# Patient Record
Sex: Female | Born: 1945 | Race: Black or African American | Hispanic: No | Marital: Married | Smoking: Former smoker
Health system: Southern US, Community
[De-identification: ages and names within clinical notes are randomized; demographics above are authoritative.]

## PROBLEM LIST (undated history)

## (undated) DIAGNOSIS — N6019 Diffuse cystic mastopathy of unspecified breast: Secondary | ICD-10-CM

## (undated) DIAGNOSIS — I5189 Other ill-defined heart diseases: Secondary | ICD-10-CM

## (undated) DIAGNOSIS — Z950 Presence of cardiac pacemaker: Secondary | ICD-10-CM

## (undated) DIAGNOSIS — M1990 Unspecified osteoarthritis, unspecified site: Secondary | ICD-10-CM

## (undated) DIAGNOSIS — M5127 Other intervertebral disc displacement, lumbosacral region: Secondary | ICD-10-CM

## (undated) DIAGNOSIS — Z8619 Personal history of other infectious and parasitic diseases: Secondary | ICD-10-CM

## (undated) DIAGNOSIS — I350 Nonrheumatic aortic (valve) stenosis: Secondary | ICD-10-CM

## (undated) DIAGNOSIS — D649 Anemia, unspecified: Secondary | ICD-10-CM

## (undated) DIAGNOSIS — I441 Atrioventricular block, second degree: Secondary | ICD-10-CM

## (undated) DIAGNOSIS — R911 Solitary pulmonary nodule: Secondary | ICD-10-CM

## (undated) DIAGNOSIS — E7800 Pure hypercholesterolemia, unspecified: Secondary | ICD-10-CM

## (undated) DIAGNOSIS — I10 Essential (primary) hypertension: Secondary | ICD-10-CM

## (undated) DIAGNOSIS — K635 Polyp of colon: Secondary | ICD-10-CM

## (undated) DIAGNOSIS — E785 Hyperlipidemia, unspecified: Secondary | ICD-10-CM

## (undated) DIAGNOSIS — K5790 Diverticulosis of intestine, part unspecified, without perforation or abscess without bleeding: Secondary | ICD-10-CM

## (undated) DIAGNOSIS — K5090 Crohn's disease, unspecified, without complications: Secondary | ICD-10-CM

## (undated) DIAGNOSIS — I340 Nonrheumatic mitral (valve) insufficiency: Secondary | ICD-10-CM

## (undated) HISTORY — DX: Other intervertebral disc displacement, lumbosacral region: M51.27

## (undated) HISTORY — DX: Diffuse cystic mastopathy of unspecified breast: N60.19

## (undated) HISTORY — DX: Anemia, unspecified: D64.9

## (undated) HISTORY — DX: Diverticulosis of intestine, part unspecified, without perforation or abscess without bleeding: K57.90

## (undated) HISTORY — DX: Hyperlipidemia, unspecified: E78.5

## (undated) HISTORY — DX: Essential (primary) hypertension: I10

## (undated) HISTORY — DX: Pure hypercholesterolemia, unspecified: E78.00

## (undated) HISTORY — PX: BREAST BIOPSY: SHX20

## (undated) HISTORY — DX: Crohn's disease, unspecified, without complications: K50.90

## (undated) HISTORY — DX: Personal history of other infectious and parasitic diseases: Z86.19

## (undated) HISTORY — DX: Solitary pulmonary nodule: R91.1

## (undated) HISTORY — DX: Unspecified osteoarthritis, unspecified site: M19.90

## (undated) HISTORY — DX: Polyp of colon: K63.5

## (undated) HISTORY — PX: REPLACEMENT TOTAL KNEE: SUR1224

---

## 1990-08-11 HISTORY — PX: BACK SURGERY: SHX140

## 1996-08-11 HISTORY — PX: COLON SURGERY: SHX602

## 1996-08-11 HISTORY — PX: OTHER SURGICAL HISTORY: SHX169

## 1997-08-11 HISTORY — PX: POSTERIOR LAMINECTOMY / DECOMPRESSION LUMBAR SPINE: SUR740

## 2001-08-11 HISTORY — PX: COLECTOMY: SHX59

## 2002-01-25 DIAGNOSIS — K5010 Crohn's disease of large intestine without complications: Secondary | ICD-10-CM | POA: Insufficient documentation

## 2002-08-11 DIAGNOSIS — R911 Solitary pulmonary nodule: Secondary | ICD-10-CM

## 2002-08-11 HISTORY — DX: Solitary pulmonary nodule: R91.1

## 2003-01-26 DIAGNOSIS — Z5181 Encounter for therapeutic drug level monitoring: Secondary | ICD-10-CM | POA: Insufficient documentation

## 2003-01-26 DIAGNOSIS — Z9049 Acquired absence of other specified parts of digestive tract: Secondary | ICD-10-CM | POA: Insufficient documentation

## 2004-07-24 ENCOUNTER — Ambulatory Visit: Payer: Self-pay | Admitting: Internal Medicine

## 2004-08-23 ENCOUNTER — Ambulatory Visit: Payer: Self-pay | Admitting: Internal Medicine

## 2005-04-25 ENCOUNTER — Ambulatory Visit: Payer: Self-pay | Admitting: Internal Medicine

## 2005-06-06 ENCOUNTER — Ambulatory Visit: Payer: Self-pay | Admitting: Internal Medicine

## 2005-08-11 HISTORY — PX: ROTATOR CUFF REPAIR: SHX139

## 2005-09-10 ENCOUNTER — Emergency Department: Payer: Self-pay | Admitting: Emergency Medicine

## 2005-10-23 ENCOUNTER — Ambulatory Visit: Payer: Self-pay | Admitting: Orthopaedic Surgery

## 2005-10-31 ENCOUNTER — Other Ambulatory Visit: Payer: Self-pay

## 2005-11-04 ENCOUNTER — Ambulatory Visit: Payer: Self-pay | Admitting: Orthopaedic Surgery

## 2006-05-08 ENCOUNTER — Ambulatory Visit: Payer: Self-pay | Admitting: Internal Medicine

## 2007-07-13 LAB — HM COLONOSCOPY

## 2007-08-12 DIAGNOSIS — K635 Polyp of colon: Secondary | ICD-10-CM

## 2007-08-12 HISTORY — DX: Polyp of colon: K63.5

## 2007-08-12 HISTORY — PX: COLONOSCOPY: SHX174

## 2007-09-28 ENCOUNTER — Ambulatory Visit: Payer: Self-pay | Admitting: Internal Medicine

## 2008-10-09 ENCOUNTER — Ambulatory Visit: Payer: Self-pay | Admitting: Internal Medicine

## 2009-10-12 ENCOUNTER — Ambulatory Visit: Payer: Self-pay | Admitting: Internal Medicine

## 2009-12-23 ENCOUNTER — Ambulatory Visit: Payer: Self-pay | Admitting: Internal Medicine

## 2010-07-01 ENCOUNTER — Ambulatory Visit: Payer: Self-pay | Admitting: Internal Medicine

## 2010-08-21 ENCOUNTER — Ambulatory Visit: Payer: Self-pay | Admitting: Family Medicine

## 2010-10-14 ENCOUNTER — Ambulatory Visit: Payer: Self-pay | Admitting: Internal Medicine

## 2010-10-14 LAB — HM MAMMOGRAPHY

## 2011-07-13 LAB — HM PAP SMEAR

## 2011-10-20 ENCOUNTER — Ambulatory Visit: Payer: Self-pay | Admitting: Internal Medicine

## 2011-12-20 ENCOUNTER — Ambulatory Visit: Payer: Self-pay

## 2012-05-17 ENCOUNTER — Other Ambulatory Visit: Payer: Self-pay | Admitting: Internal Medicine

## 2012-05-17 MED ORDER — LISINOPRIL 20 MG PO TABS: 20.0000 mg | ORAL_TABLET | Freq: Every day | ORAL | Status: DC

## 2012-05-17 NOTE — Telephone Encounter (Signed)
Refill request for lisinopril 20 mg tab Qty: 30 Sig: take 1 tablet by mouth every day

## 2012-05-17 NOTE — Telephone Encounter (Signed)
Patient has an appt in 08/2012 please advise.

## 2012-05-17 NOTE — Telephone Encounter (Signed)
Ok x 3.

## 2012-07-22 ENCOUNTER — Telehealth: Payer: Self-pay | Admitting: Internal Medicine

## 2012-07-22 NOTE — Telephone Encounter (Signed)
Refill request for atenolol 50 mg tablet Qty: 30 Sig: take 1 tablet by mouth once a day Patient coming in on 08/12/12

## 2012-07-23 ENCOUNTER — Other Ambulatory Visit: Payer: Self-pay | Admitting: *Deleted

## 2012-07-23 MED ORDER — ATENOLOL 50 MG PO TABS: 50.0000 mg | ORAL_TABLET | Freq: Every day | ORAL | Status: DC

## 2012-07-23 NOTE — Progress Notes (Signed)
Refilled script for 30 pills zero refills. Appointment 08/12/2012

## 2012-08-12 ENCOUNTER — Other Ambulatory Visit (HOSPITAL_COMMUNITY)
Admission: RE | Admit: 2012-08-12 | Discharge: 2012-08-12 | Disposition: A | Discharge status: Home / Self Care | Payer: Medicare Other | Source: Ambulatory Visit | Attending: Internal Medicine | Admitting: Internal Medicine

## 2012-08-12 ENCOUNTER — Encounter: Payer: Self-pay | Admitting: Internal Medicine

## 2012-08-12 ENCOUNTER — Ambulatory Visit (INDEPENDENT_AMBULATORY_CARE_PROVIDER_SITE_OTHER): Payer: Medicare Other | Admitting: Internal Medicine

## 2012-08-12 VITALS — BP 120/80 | HR 61 | Temp 97.7°F | Ht 65.0 in | Wt 158.2 lb

## 2012-08-12 DIAGNOSIS — K5090 Crohn's disease, unspecified, without complications: Secondary | ICD-10-CM

## 2012-08-12 DIAGNOSIS — R5381 Other malaise: Secondary | ICD-10-CM

## 2012-08-12 DIAGNOSIS — E7800 Pure hypercholesterolemia, unspecified: Secondary | ICD-10-CM

## 2012-08-12 DIAGNOSIS — R5383 Other fatigue: Secondary | ICD-10-CM

## 2012-08-12 DIAGNOSIS — I10 Essential (primary) hypertension: Secondary | ICD-10-CM

## 2012-08-12 DIAGNOSIS — Z01419 Encounter for gynecological examination (general) (routine) without abnormal findings: Secondary | ICD-10-CM | POA: Insufficient documentation

## 2012-08-12 DIAGNOSIS — Z1151 Encounter for screening for human papillomavirus (HPV): Secondary | ICD-10-CM | POA: Insufficient documentation

## 2012-08-12 DIAGNOSIS — Z139 Encounter for screening, unspecified: Secondary | ICD-10-CM

## 2012-08-12 DIAGNOSIS — Z23 Encounter for immunization: Secondary | ICD-10-CM

## 2012-08-12 LAB — CBC WITH DIFFERENTIAL/PLATELET
Basophils Absolute: 0 10*3/uL (ref 0.0–0.1)
Basophils Relative: 0.4 % (ref 0.0–3.0)
Eosinophils Absolute: 0 10*3/uL (ref 0.0–0.7)
Eosinophils Relative: 0.1 % (ref 0.0–5.0)
HCT: 36.5 % (ref 36.0–46.0)
Hemoglobin: 12.5 g/dL (ref 12.0–15.0)
Lymphocytes Relative: 29.3 % (ref 12.0–46.0)
Lymphs Abs: 2.3 10*3/uL (ref 0.7–4.0)
MCHC: 34.1 g/dL (ref 30.0–36.0)
MCV: 92.3 fl (ref 78.0–100.0)
Monocytes Absolute: 0.5 10*3/uL (ref 0.1–1.0)
Monocytes Relative: 6.3 % (ref 3.0–12.0)
Neutro Abs: 5.1 10*3/uL (ref 1.4–7.7)
Neutrophils Relative %: 63.9 % (ref 43.0–77.0)
Platelets: 177 10*3/uL (ref 150.0–400.0)
RBC: 3.96 Mil/uL (ref 3.87–5.11)
RDW: 13.9 % (ref 11.5–14.6)
WBC: 8 10*3/uL (ref 4.5–10.5)

## 2012-08-12 LAB — HEPATIC FUNCTION PANEL
ALT: 21 U/L (ref 0–35)
AST: 27 U/L (ref 0–37)
Albumin: 3.8 g/dL (ref 3.5–5.2)
Alkaline Phosphatase: 67 U/L (ref 39–117)
Bilirubin, Direct: 0.1 mg/dL (ref 0.0–0.3)
Total Bilirubin: 1.1 mg/dL (ref 0.3–1.2)
Total Protein: 7.2 g/dL (ref 6.0–8.3)

## 2012-08-12 LAB — TSH: TSH: 0.74 u[IU]/mL (ref 0.35–5.50)

## 2012-08-12 LAB — BASIC METABOLIC PANEL
BUN: 14 mg/dL (ref 6–23)
CO2: 25 mEq/L (ref 19–32)
Calcium: 9 mg/dL (ref 8.4–10.5)
Chloride: 108 mEq/L (ref 96–112)
Creatinine, Ser: 0.8 mg/dL (ref 0.4–1.2)
GFR: 89.5 mL/min (ref >60.00)
Glucose, Bld: 108 mg/dL — ABNORMAL HIGH (ref 70–99)
Potassium: 3.7 mEq/L (ref 3.5–5.1)
Sodium: 141 mEq/L (ref 135–145)

## 2012-08-12 LAB — LIPID PANEL
Cholesterol: 199 mg/dL (ref 0–200)
HDL: 50.4 mg/dL (ref >39.00)
LDL Cholesterol: 113 mg/dL — ABNORMAL HIGH (ref 0–99)
Total CHOL/HDL Ratio: 4
Triglycerides: 180 mg/dL — ABNORMAL HIGH (ref 0.0–149.0)
VLDL: 36 mg/dL (ref 0.0–40.0)

## 2012-08-12 MED ORDER — FLUTICASONE PROPIONATE 50 MCG/ACT NA SUSP: 2.0000 | Freq: Every day | NASAL | Status: DC

## 2012-08-12 NOTE — Assessment & Plan Note (Addendum)
Followed by Dr Evalee Mutton at [facility].  Is planning for a follow up colonoscopy in 4/14.  Already scheduled.  Currently asymptomatic.  Check cbc.

## 2012-08-12 NOTE — Progress Notes (Signed)
Subjective:    Patient ID: Ellen Drake, female    DOB: [birth year], 67 y.o.   MRN: [id number]  HPI 67 year old female with past history of hypercholesterolemia, hypertension, Crohn's disease and fibrocystic breast disease who comes in today to follow up on these issues as well as for a complete physical exam.  States she has been doing relatively well.  Some increased stress.  Her sister-n-law recently passed away.  She feels she is handling things relatively well.  No cardiac symptoms with increased activity or exertion.  No bowel change.  No abdominal pain.  Breathing stable.    Past Medical History  Diagnosis Date  . Hypertension   . Hypercholesterolemia   . Crohn's disease   . Fibrocystic breast disease   . Diverticulosis     Current Outpatient Prescriptions on File Prior to Visit  Medication Sig Dispense Refill  . atenolol (TENORMIN) 50 MG tablet Take 1 tablet (50 mg total) by mouth daily.  30 tablet  0  . lisinopril (PRINIVIL,ZESTRIL) 20 MG tablet Take 1 tablet (20 mg total) by mouth daily.  30 tablet  3  . mesalamine (PENTASA) 250 MG CR capsule Take 6 tablets bid      . fluticasone (FLONASE) 50 MCG/ACT nasal spray Place 2 sprays into the nose daily.  16 g  0    Review of Systems Patient denies any headache, lightheadedness or dizziness. States starting this am, has noticed some runny nose and increased drainage.  No fever.  No sinus pressure.  No chest pain, tightness or palpitations.  No increased shortness of breath, cough or congestion.  No nausea or vomiting.  No abdominal pain or cramping.  No bowel change, such as diarrhea, constipation, BRBPR or melana.  No urine change.  Quit smoking two years ago.  She does report noticing a small lesion inside her left ear.  First noticed a few weeks ago.  Minimal tenderness.       Objective:   Physical Exam Filed Vitals:   08/12/12 0816  BP: 120/80  Pulse: 61  Temp: 97.7 F (36.5 C)   Blood pressure recheck:  140-88/63  67  year old female in no acute distress.   HEENT:  Nares- clear.  Oropharynx - without lesions.  Small lesion just inside left ear.  Minimal erythema.  Minimal tenderness.   NECK:  Supple.  Nontender.  No audible bruit.  HEART:  Appears to be regular. LUNGS:  No crackles or wheezing audible.  Respirations even and unlabored.  RADIAL PULSE:  Equal bilaterally.    BREASTS:  No nipple discharge or nipple retraction present.  Could not appreciate any distinct nodules or axillary adenopathy.  ABDOMEN:  Soft, nontender.  Bowel sounds present and normal.  No audible abdominal bruit.  GU:  Normal external genitalia.  Vaginal vault without lesions.  Cervix identified.  Pap performed. Could not appreciate any adnexal masses or tenderness.   RECTAL:  Heme negative.   EXTREMITIES:  No increased edema present.  DP pulses palpable and equal bilaterally.           Assessment & Plan:  PROBABLE ALLERGIES.  Treat with saline nasal flushes and flonase nasal spray as directed.  Notify me if any persistent problems.    EAR LESION.  Treat with bacroban cream.  Let me know if incomplete resolution.   PULMONARY.  Breathing stable.  Quit smoking two years ago.  Follow.   HEALTH MAINTENANCE.  Physical today.  Pap performed.  Last  mammogram 10/20/11 - BiRADS II.  Schedule follow up mammogram.   Colonoscopy 11/16/07.  Due a follow up colonoscopy 4/14.  Already scheduled.

## 2012-08-14 ENCOUNTER — Encounter: Payer: Self-pay | Admitting: Internal Medicine

## 2012-08-14 ENCOUNTER — Telehealth: Payer: Self-pay | Admitting: Internal Medicine

## 2012-08-14 DIAGNOSIS — R739 Hyperglycemia, unspecified: Secondary | ICD-10-CM

## 2012-08-14 NOTE — Assessment & Plan Note (Signed)
Blood pressure borderline on my check.  Have her spot check her pressures and get her back in soon to reassess.  Same medication regimen.  Check metabolic panel.

## 2012-08-14 NOTE — Telephone Encounter (Signed)
Pt notified of labs and need for follow up sugar check.  Pt coming in 10/18/12 at 8:45.  Pt aware of appt.  Please put on lab schedule.  Thanks.

## 2012-08-14 NOTE — Assessment & Plan Note (Signed)
Low cholesterol diet and exercise.  Check lipid panel.

## 2012-08-17 ENCOUNTER — Encounter: Payer: Self-pay | Admitting: *Deleted

## 2012-09-06 ENCOUNTER — Encounter: Payer: Self-pay | Admitting: Adult Health

## 2012-09-06 ENCOUNTER — Ambulatory Visit (INDEPENDENT_AMBULATORY_CARE_PROVIDER_SITE_OTHER): Payer: Medicare Other | Admitting: Adult Health

## 2012-09-06 ENCOUNTER — Telehealth: Payer: Self-pay | Admitting: Internal Medicine

## 2012-09-06 VITALS — BP 133/82 | HR 80 | Temp 98.1°F | Resp 18 | Ht 66.0 in | Wt 157.0 lb

## 2012-09-06 DIAGNOSIS — J40 Bronchitis, not specified as acute or chronic: Secondary | ICD-10-CM

## 2012-09-06 MED ORDER — AMOXICILLIN-POT CLAVULANATE 875-125 MG PO TABS: 1.0000 | ORAL_TABLET | Freq: Two times a day (BID) | ORAL | Status: DC

## 2012-09-06 MED ORDER — HYDROCODONE-HOMATROPINE 5-1.5 MG/5ML PO SYRP: 5.0000 mL | ORAL_SOLUTION | Freq: Three times a day (TID) | ORAL | Status: DC | PRN

## 2012-09-06 NOTE — Patient Instructions (Addendum)
  Please start the antibiotic today. You will take it for 7 days.  I have given you a prescription for Hycodan. This is a cough medicine and it can make you drowsy.  What is bronchitis? - Bronchitis is an infection that causes a cough. It happens when the tubes that carry air into the lungs, called the "bronchi," get infected.   Usually, bronchitis happens when a person gets a cold or the flu. The viruses that cause the cold or flu infect the bronchi and irritate them.    What are the symptoms of bronchitis? - The most common symptoms of bronchitis are:  A nagging cough that can last up to a few weeks  Coughing up mucus that is clear, yellow, or green  People with bronchitis do not usually get a fever.  When should I call the doctor or nurse? - Most people who have a cough that lasts longer than their other cold or flu symptoms do not need to see a doctor. But you should call your doctor or nurse if you have:  A fever higher than 100.40F (38C)  A cough that lasts longer than 10 days  Chest pain when you cough, trouble breathing, or coughing up blood  A barking cough that makes it hard to talk  A cough and weight loss that you cannot explain  Is there a test for bronchitis? - People do not usually need a test. But your doctor or nurse might do a test, such as a chest X-ray, if the cause of your cough isn't clear.   How is bronchitis treated? - Doctors do not usually treat bronchitis with antibiotic medicines. That's because bronchitis is usually caused by a virus, and antibiotics kill bacteria-not viruses.   To feel better, you can treat your cold and flu symptoms. Different treatments you can try include:  Taking a pain-relieving medicine  Taking over-the-counter cough and cold medicines  Breathing in warm, moist air, such as in the shower, over a kettle, or from a humidifier  How can I keep from getting bronchitis again? - You can reduce your chance of getting bronchitis again by  keeping the germs that cause bronchitis out of your body. One of the best ways to do this is to wash your hands often with soap and water. If there is no sink nearby, you can use a hand gel with alcohol in it to clean your hands.   How can I keep from spreading my germs? - In addition to washing your hands often, you should cover your mouth with your elbow when you sneeze or cough. Using your elbow keeps you from getting germs on your hands. If you use a tissue, throw the tissue away and wash your hands.

## 2012-09-06 NOTE — Telephone Encounter (Signed)
Patient Information:  Caller Name: Amnah  Phone: [phone]  Patient: Ellen Drake, Ellen Drake  Gender: Female  DOB: Oct 02, 1945  Age: 67 Years  PCP: Einar Pheasant  Office Follow Up:  Does the office need to follow up with this patient?: No  Instructions For The Office: N/A   Symptoms  Reason For Call & Symptoms: Cough.  Vomiting, body aches, earache, stuffy nose.  Reports severe coughing spells and coughing up rusty colored sputum.  Reviewed Health History In EMR: Yes  Reviewed Medications In EMR: Yes  Reviewed Allergies In EMR: Yes  Reviewed Surgeries / Procedures: Yes  Date of Onset of Symptoms: 09/03/2012  Treatments Tried: Tylenol OTC, Cough Syrup  Treatments Tried Worked: No  Guideline(s) Used:  Cough  Disposition Per Guideline:   See Today in Office  Reason For Disposition Reached:   Coughing up rusty-colored (reddish-brown) or blood-tinged sputum  Advice Given:  Reassurance  Coughing is the way that our lungs remove irritants and mucus. It helps protect our lungs from getting pneumonia.  Cough Medicines:  Home Remedy - Hard Candy: Hard candy works just as well as medicine-flavored OTC cough drops. Diabetics should use sugar-free candy.  Home Remedy - Honey: This old home remedy has been shown to help decrease coughing at night. The adult dosage is 2 teaspoons (10 ml) at bedtime. Honey should not be given to infants under one year of age.  Coughing Spasms:  Drink warm fluids. Inhale warm mist (Reason: both relax the airway and loosen up the phlegm).  Prevent Dehydration:  Drink adequate liquids.  This will help soothe an irritated or dry throat and loosen up the phlegm.  Avoid Tobacco Smoke:  Smoking or being exposed to smoke makes coughs much worse.  Expected Course:   The expected course depends on what is causing the cough.  Viral bronchitis (chest cold) causes a cough that lasts 1 to 3 weeks. Sometimes you may cough up lots of phlegm (sputum, mucus). The mucus  can normally be white, gray, yellow, or green.  Call Back If:  Difficulty breathing  You become worse.  Appointment Scheduled:  09/06/2012 14:00:00 Appointment Scheduled Provider:  Charolette Forward

## 2012-09-06 NOTE — Assessment & Plan Note (Addendum)
Bronchitis with sinus infection. Febrile x 3 days. Cough keeping her up at night. Productive, dark sputum. Sinus congestion, drainage. Start augmentin. Hycodan for cough. RTC if no improvement within 3-4 days.

## 2012-09-06 NOTE — Progress Notes (Signed)
  Subjective:    Patient ID: Ellen Drake, female    DOB: 1945/09/14, 66 y.o.   MRN: [id number]  HPI  Patient is a very pleasant 67 y/o female who presents to clinic with 3 day hx of fever, malaise, cough, sinus drainage/congestion, productive, dark drainage. Denies shortness of breath. Patient has received her flu shot this season. She reports being around sick contacts frequently - she drives a school bus. Her cough has been keeping her up at night. She has tried OTC cough medicine without any improvement in symptoms.  Current Outpatient Prescriptions on File Prior to Visit  Medication Sig Dispense Refill  . aspirin 81 MG tablet Take 81 mg by mouth daily.      Marland Kitchen atenolol (TENORMIN) 50 MG tablet Take 1 tablet (50 mg total) by mouth daily.  30 tablet  0  . brimonidine (ALPHAGAN) 0.2 % ophthalmic solution 1 drop 3 (three) times daily.      . fluticasone (FLONASE) 50 MCG/ACT nasal spray Place 2 sprays into the nose daily.  16 g  0  . latanoprost (XALATAN) 0.005 % ophthalmic solution 1 drop at bedtime.      Marland Kitchen lisinopril (PRINIVIL,ZESTRIL) 20 MG tablet Take 1 tablet (20 mg total) by mouth daily.  30 tablet  3  . mesalamine (PENTASA) 250 MG CR capsule Take 6 tablets bid      . timolol (BETIMOL) 0.5 % ophthalmic solution 1 drop 2 (two) times daily.          Review of Systems  Constitutional: Positive for fever and chills.  HENT: Positive for congestion, sore throat, rhinorrhea, postnasal drip and sinus pressure.   Eyes: Negative.   Respiratory: Positive for cough. Negative for shortness of breath and wheezing.   Cardiovascular: Negative.   Gastrointestinal: Positive for nausea.  Genitourinary: Negative.   Musculoskeletal:       Generalized aches/pains.  Neurological: Negative.   Psychiatric/Behavioral: Negative.     BP 133/82  Pulse 80  Temp 98.1 F (36.7 C) (Oral)  Resp 18  Ht 5' 6"  (1.676 m)  Wt 157 lb (71.215 kg)  BMI 25.34 kg/m2  SpO2 91%  LMP 08/13/1995     Objective:     Physical Exam  Constitutional: She is oriented to person, place, and time. She appears well-developed and well-nourished.  HENT:  Head: Normocephalic and atraumatic.  Right Ear: External ear normal.  Left Ear: External ear normal.       Pharyngeal erythema without exudate.  Cardiovascular: Normal rate, regular rhythm and normal heart sounds.  Exam reveals no gallop.   No murmur heard. Pulmonary/Chest: Effort normal and breath sounds normal.  Abdominal: Soft. Bowel sounds are normal.  Lymphadenopathy:    She has cervical adenopathy.  Neurological: She is alert and oriented to person, place, and time.  Skin: Skin is warm and dry. No rash noted.  Psychiatric: She has a normal mood and affect. Her behavior is normal. Judgment and thought content normal.          Assessment & Plan:

## 2012-09-09 ENCOUNTER — Telehealth: Payer: Self-pay | Admitting: Internal Medicine

## 2012-09-09 NOTE — Telephone Encounter (Signed)
Pt is saying that her cough is getting much worse from her lat visit with Raquel.

## 2012-09-09 NOTE — Telephone Encounter (Signed)
I am unable to see her tomorrow.  I will be happy to see her Monday 09/13/12 - 11:00 work in for this.  If she is having any acute problems - needs eval before Monday and then can follow up with me afterwards.

## 2012-09-09 NOTE — Telephone Encounter (Signed)
Patient notified.  Appointment scheduled.

## 2012-09-10 ENCOUNTER — Ambulatory Visit: Payer: Self-pay | Admitting: Emergency Medicine

## 2012-09-13 ENCOUNTER — Ambulatory Visit: Payer: Medicare Other | Admitting: Internal Medicine

## 2012-09-14 ENCOUNTER — Other Ambulatory Visit: Payer: Self-pay | Admitting: *Deleted

## 2012-09-14 MED ORDER — LISINOPRIL 20 MG PO TABS: 20.0000 mg | ORAL_TABLET | Freq: Every day | ORAL | Status: DC

## 2012-10-01 ENCOUNTER — Ambulatory Visit (INDEPENDENT_AMBULATORY_CARE_PROVIDER_SITE_OTHER): Payer: Medicare Other | Admitting: Internal Medicine

## 2012-10-01 ENCOUNTER — Encounter: Payer: Self-pay | Admitting: Internal Medicine

## 2012-10-01 VITALS — BP 120/80 | HR 55 | Temp 97.5°F | Ht 65.0 in | Wt 158.0 lb

## 2012-10-01 DIAGNOSIS — I10 Essential (primary) hypertension: Secondary | ICD-10-CM

## 2012-10-01 MED ORDER — FLUTICASONE PROPIONATE HFA 110 MCG/ACT IN AERO: 1.0000 | INHALATION_SPRAY | Freq: Two times a day (BID) | RESPIRATORY_TRACT | Status: DC

## 2012-10-01 MED ORDER — PREDNISONE 10 MG PO TABS: ORAL_TABLET | ORAL | Status: DC

## 2012-10-01 MED ORDER — CEFDINIR 300 MG PO CAPS: 300.0000 mg | ORAL_CAPSULE | Freq: Two times a day (BID) | ORAL | Status: DC

## 2012-10-04 ENCOUNTER — Telehealth: Payer: Self-pay | Admitting: Internal Medicine

## 2012-10-04 ENCOUNTER — Encounter: Payer: Self-pay | Admitting: Internal Medicine

## 2012-10-04 NOTE — Telephone Encounter (Signed)
Faxed request for records

## 2012-10-04 NOTE — Assessment & Plan Note (Signed)
Blood pressure under good control today.  Follow.

## 2012-10-04 NOTE — Progress Notes (Signed)
Subjective:    Patient ID: Ellen Drake, female    DOB: 1945/12/18, 67 y.o.   MRN: [id number]  Cough  67 year old female with past history of hypercholesterolemia, hypertension, Crohn's disease and fibrocystic breast disease who comes in today as a work in with concerns regarding persistent cough and congestion.  She saw Raquel recently.  Had been having increased cough and congestion for approximately one week.  See her note for details.  Was placed on augmentin.  Symptoms persistent and she went to [facility] a few days later.  [facility]).  States she was told she possibly had pneumonia - per CXR.  Was given an abx and an inhaler.  States she is off all abx now.  Still with increased cough - productive.  Mucus no longer yellow. Some wheezing.  No increased sob.  No nausea.  Eating.  No fever and no rash. She does report bilateral knee stiffness.  Has come on in the last week or so.  Increased discomfort going from sitting to standing.  No other joint issues.     Past Medical History  Diagnosis Date  . Hypertension   . Hypercholesterolemia   . Crohn's disease   . Fibrocystic breast disease   . Diverticulosis     Current Outpatient Prescriptions on File Prior to Visit  Medication Sig Dispense Refill  . aspirin 81 MG tablet Take 81 mg by mouth daily.      Marland Kitchen atenolol (TENORMIN) 50 MG tablet Take 1 tablet (50 mg total) by mouth daily.  30 tablet  0  . brimonidine (ALPHAGAN) 0.2 % ophthalmic solution 1 drop 3 (three) times daily.      . fluticasone (FLONASE) 50 MCG/ACT nasal spray Place 2 sprays into the nose daily.  16 g  0  . latanoprost (XALATAN) 0.005 % ophthalmic solution 1 drop at bedtime.      Marland Kitchen lisinopril (PRINIVIL,ZESTRIL) 20 MG tablet Take 1 tablet (20 mg total) by mouth daily.  30 tablet  3  . mesalamine (PENTASA) 250 MG CR capsule Take 6 tablets bid      . timolol (BETIMOL) 0.5 % ophthalmic solution 1 drop 2 (two) times daily.      Marland Kitchen amoxicillin-clavulanate (AUGMENTIN)  875-125 MG per tablet Take 1 tablet by mouth 2 (two) times daily.  14 tablet  0  . HYDROcodone-homatropine (HYCODAN) 5-1.5 MG/5ML syrup Take 5 mLs by mouth every 8 (eight) hours as needed for cough.  120 mL  0   No current facility-administered medications on file prior to visit.    Review of Systems  Respiratory: Positive for cough.   Patient denies any headache, lightheadedness or dizziness. Describes the increased congestion and cough as outlined.  No sinus pressure.  No sore throat.  No fever.  No rash.  No nausea or vomiting.  Bowels stable.  No abdominal pain.  Does report the knee stiffness as outlined.  No other joint complaints.        Objective:   Physical Exam  Filed Vitals:   10/01/12 0926  BP: 120/80  Pulse: 55  Temp: 97.5 F (52.35 C)   67 year old female in no acute distress.   HEENT:  Nares- clear.  Oropharynx - without lesions.  TMs visualized - without erythema.  No sinus tenderness to palpation.     NECK:  Supple.  Nontender.  HEART:  Appears to be regular. LUNGS:  No crackles.  Increased cough with forced expiration.  No increased wheezing.  Respirations even and unlabored.  RADIAL PULSE:  Equal bilaterally.  ABDOMEN:  Soft, nontender.  Bowel sounds present and normal.  No audible abdominal bruit.  EXTREMITIES:  No increased edema present.  DP pulses palpable and equal bilaterally.       MSK:  No increased swelling or erythema of the knees.  Increased stiffness/discomfort going from sitting to standing.      Assessment & Plan:  PULMONARY.  Persistent cough and congestion.  Symptoms and exam as outlined.  Need to obtain records from [facility].  Will treat with a prednisone taper - starting at 24m and decreasing by 596mper day until off.  Omnicef 30031m2/day) as directed.  Mucinex/robitussin as directed.  Continue albuterol inhaler.  Will add Flovent 110m25m two puffs bid.  Follow closely.  If any change or worsening symptoms, she is to be evaluated  immediately.      MSK.  Knee stiffness as outlined.  Unclear as to the exact etiology.  No redness or swelling.  No fever or rash.  The prednisone taper should help with this.  Get her back in soon to reassess.  Further w/up pending response.    HEALTH MAINTENANCE.  Physical last visit.  Pap performed.  Last mammogram 10/20/11 - BiRADS II.  Scheduled for a follow up mammogram.   Colonoscopy 11/16/07.  Due a follow up colonoscopy 4/14.  Already scheduled.

## 2012-10-04 NOTE — Telephone Encounter (Signed)
Need records from [facility].  Pt was seen there a few weeks ago.  Also need cxr results.  Please also call pt and confirm she is doing better.  I saw her as an acute visit last week.

## 2012-10-05 NOTE — Telephone Encounter (Signed)
Records received

## 2012-10-18 ENCOUNTER — Other Ambulatory Visit: Payer: Medicare Other

## 2012-10-19 ENCOUNTER — Ambulatory Visit (INDEPENDENT_AMBULATORY_CARE_PROVIDER_SITE_OTHER): Payer: Medicare Other | Admitting: Internal Medicine

## 2012-10-19 ENCOUNTER — Encounter: Payer: Self-pay | Admitting: Internal Medicine

## 2012-10-19 VITALS — BP 120/70 | HR 60 | Temp 97.6°F | Ht 65.0 in | Wt 159.5 lb

## 2012-10-19 DIAGNOSIS — K5090 Crohn's disease, unspecified, without complications: Secondary | ICD-10-CM

## 2012-10-19 DIAGNOSIS — R739 Hyperglycemia, unspecified: Secondary | ICD-10-CM

## 2012-10-19 DIAGNOSIS — E7800 Pure hypercholesterolemia, unspecified: Secondary | ICD-10-CM

## 2012-10-19 DIAGNOSIS — R7309 Other abnormal glucose: Secondary | ICD-10-CM

## 2012-10-19 DIAGNOSIS — I10 Essential (primary) hypertension: Secondary | ICD-10-CM

## 2012-10-19 DIAGNOSIS — M25561 Pain in right knee: Secondary | ICD-10-CM

## 2012-10-19 DIAGNOSIS — M25569 Pain in unspecified knee: Secondary | ICD-10-CM

## 2012-10-19 LAB — GLUCOSE, RANDOM: Glucose, Bld: 90 mg/dL (ref 70–99)

## 2012-10-19 LAB — HEMOGLOBIN A1C: Hgb A1c MFr Bld: 5.7 % (ref 4.6–6.5)

## 2012-10-19 LAB — SEDIMENTATION RATE: Sed Rate: 29 mm/hr — ABNORMAL HIGH (ref 0–22)

## 2012-10-19 LAB — C-REACTIVE PROTEIN: CRP: 1.2 mg/dL (ref 0.5–20.0)

## 2012-10-20 ENCOUNTER — Ambulatory Visit: Payer: Self-pay | Admitting: Internal Medicine

## 2012-10-20 ENCOUNTER — Encounter: Payer: Self-pay | Admitting: Internal Medicine

## 2012-10-20 LAB — ANA: Anti Nuclear Antibody(ANA): POSITIVE — AB

## 2012-10-20 LAB — RHEUMATOID FACTOR: Rhuematoid fact SerPl-aCnc: 36 IU/mL — ABNORMAL HIGH (ref <=14)

## 2012-10-20 LAB — ANTI-NUCLEAR AB-TITER (ANA TITER): ANA Titer 1: 1:40 {titer} — ABNORMAL HIGH

## 2012-10-26 ENCOUNTER — Encounter: Payer: Self-pay | Admitting: Internal Medicine

## 2012-10-26 NOTE — Assessment & Plan Note (Signed)
Low cholesterol diet and exercise.  Follow lipid panel.

## 2012-10-26 NOTE — Assessment & Plan Note (Signed)
Blood pressure - good control.  Same medication regimen.  Follow.

## 2012-10-26 NOTE — Progress Notes (Signed)
Order placed for referral.  

## 2012-10-26 NOTE — Progress Notes (Signed)
Subjective:    Patient ID: Ellen Drake, female    DOB: May 19, 1946, 67 y.o.   MRN: [id number]  Cough  67 year old female with past history of hypercholesterolemia, hypertension, Crohn's disease and fibrocystic breast disease who comes in today for a scheduled follow up.  She was evaluated on 10/01/12 for increased cough, congestion .  She also was having problems with acute knee pain and swelling (bilateral).  See 10/01/12 note for details.  Was placed on prednisone to treat the pulmonary problem and to also treat the acute knee pain and swelling.  Breathing is back to baseline.  The cough has resolved.  Knees are better.  She is still having minimal soft tissue swelling in her right knee.  Still some minimal tenderness.  Much improved.  Able to stand and walk without significant pain.  No nausea or vomiting.  Bowels stable.  No abdominal pain.     Past Medical History  Diagnosis Date  . Hypertension   . Hypercholesterolemia   . Crohn's disease   . Fibrocystic breast disease   . Diverticulosis     Current Outpatient Prescriptions on File Prior to Visit  Medication Sig Dispense Refill  . albuterol (PROAIR HFA) 108 (90 BASE) MCG/ACT inhaler Inhale 2 puffs into the lungs every 6 (six) hours as needed for wheezing.      Marland Kitchen amoxicillin-clavulanate (AUGMENTIN) 875-125 MG per tablet Take 1 tablet by mouth 2 (two) times daily.  14 tablet  0  . aspirin 81 MG tablet Take 81 mg by mouth daily.      Marland Kitchen atenolol (TENORMIN) 50 MG tablet Take 1 tablet (50 mg total) by mouth daily.  30 tablet  0  . brimonidine (ALPHAGAN) 0.2 % ophthalmic solution 1 drop 3 (three) times daily.      . cefdinir (OMNICEF) 300 MG capsule Take 1 capsule (300 mg total) by mouth 2 (two) times daily.  14 capsule  0  . fluticasone (FLONASE) 50 MCG/ACT nasal spray Place 2 sprays into the nose daily.  16 g  0  . fluticasone (FLOVENT HFA) 110 MCG/ACT inhaler Inhale 1 puff into the lungs 2 (two) times daily.  1 Inhaler  0  .  latanoprost (XALATAN) 0.005 % ophthalmic solution 1 drop at bedtime.      Marland Kitchen lisinopril (PRINIVIL,ZESTRIL) 20 MG tablet Take 1 tablet (20 mg total) by mouth daily.  30 tablet  3  . mesalamine (PENTASA) 250 MG CR capsule Take 6 tablets bid      . timolol (BETIMOL) 0.5 % ophthalmic solution 1 drop 2 (two) times daily.      Marland Kitchen HYDROcodone-homatropine (HYCODAN) 5-1.5 MG/5ML syrup Take 5 mLs by mouth every 8 (eight) hours as needed for cough.  120 mL  0  . predniSONE (DELTASONE) 10 MG tablet Take 6 tablets x 1 day and then decrease by 1/2 tablet per day until down to zero mg.  39 tablet  0   No current facility-administered medications on file prior to visit.    Review of Systems  Respiratory: Positive for cough.   Patient denies any headache, lightheadedness or dizziness.  No sinus or allegy symptoms.  No chest pain, tightness or palpitations.  No increased shortness of breath, cough or congestion. Previous cough and congestion resolved.  Breathing back to baseline.   No nausea or vomiting.  No abdominal pain or cramping.  No bowel change, such as diarrhea, constipation, BRBPR or melana.  No urine change.  Knees much improved.  Still with some minimal soft tissue swelling and pain.         Objective:   Physical Exam  Filed Vitals:   10/19/12 1105  BP: 120/70  Pulse: 60  Temp: 97.6 F (36.4 C)   Blood pressure recheck:  132/78, pulse 54  67 year old female in no acute distress.   HEENT:  Nares- clear.  Oropharynx - without lesions.  No sinus tenderness to palpation.     NECK:  Supple.  Nontender.  HEART:  Appears to be regular. LUNGS:  No wheezing or crackles present.  Good breath sounds bilaterally.  Clear.   RADIAL PULSE:  Equal bilaterally.  ABDOMEN:  Soft, nontender.  Bowel sounds present and normal.  No audible abdominal bruit.  EXTREMITIES:  No increased edema present.  DP pulses palpable and equal bilaterally.      MSK:  Minimal soft tissue swelling right knee.  Minimal pain.   Improved.  Able to stand and walk without increased pain.      Assessment & Plan:  PULMONARY.  Previous cough and congestion have cleared.  Breathing back to baseline.  Follow.      MSK.  Previous acute inflammation and pain/stiffness of both knees.  Treated and responded to a short course of prednisone.  Still with minimal pain.  Check rheum panel.  Further w/up pending.  May need referral to Dr Jefm Bryant.      HEALTH MAINTENANCE.  Physical 08/12/12.  Pap performed.  Last mammogram 10/20/11 - BiRADS II.  Scheduled for a follow up mammogram tomorrow.  Colonoscopy 11/16/07.  Due a follow up colonoscopy 4/14.  Already scheduled.

## 2012-10-26 NOTE — Assessment & Plan Note (Signed)
Followed by Dr Evalee Mutton at [facility].  Is planning for a follow up colonoscopy in 4/14.  Already scheduled.  Currently asymptomatic.

## 2012-11-11 ENCOUNTER — Ambulatory Visit (INDEPENDENT_AMBULATORY_CARE_PROVIDER_SITE_OTHER): Payer: Medicare Other | Admitting: Internal Medicine

## 2012-11-11 ENCOUNTER — Encounter: Payer: Self-pay | Admitting: Internal Medicine

## 2012-11-11 VITALS — BP 132/75 | HR 61 | Temp 97.7°F | Resp 16 | Ht 65.0 in | Wt 157.1 lb

## 2012-11-11 DIAGNOSIS — I10 Essential (primary) hypertension: Secondary | ICD-10-CM

## 2012-11-11 DIAGNOSIS — R109 Unspecified abdominal pain: Secondary | ICD-10-CM

## 2012-11-11 DIAGNOSIS — R197 Diarrhea, unspecified: Secondary | ICD-10-CM

## 2012-11-11 DIAGNOSIS — K5090 Crohn's disease, unspecified, without complications: Secondary | ICD-10-CM

## 2012-11-12 LAB — CBC WITH DIFFERENTIAL/PLATELET
Basophils Absolute: 0.1 10*3/uL (ref 0.0–0.1)
Basophils Relative: 1.2 % (ref 0.0–3.0)
Eosinophils Absolute: 0 10*3/uL (ref 0.0–0.7)
Eosinophils Relative: 0.2 % (ref 0.0–5.0)
HCT: 36.4 % (ref 36.0–46.0)
Hemoglobin: 12.2 g/dL (ref 12.0–15.0)
Lymphocytes Relative: 27.9 % (ref 12.0–46.0)
Lymphs Abs: 2.6 10*3/uL (ref 0.7–4.0)
MCHC: 33.5 g/dL (ref 30.0–36.0)
MCV: 90.6 fl (ref 78.0–100.0)
Monocytes Absolute: 0.3 10*3/uL (ref 0.1–1.0)
Monocytes Relative: 2.7 % — ABNORMAL LOW (ref 3.0–12.0)
Neutro Abs: 6.4 10*3/uL (ref 1.4–7.7)
Neutrophils Relative %: 68 % (ref 43.0–77.0)
Platelets: 224 10*3/uL (ref 150.0–400.0)
RBC: 4.02 Mil/uL (ref 3.87–5.11)
RDW: 13.9 % (ref 11.5–14.6)
WBC: 9.4 10*3/uL (ref 4.5–10.5)

## 2012-11-12 LAB — BASIC METABOLIC PANEL
BUN: 16 mg/dL (ref 6–23)
CO2: 23 mEq/L (ref 19–32)
Calcium: 8.8 mg/dL (ref 8.4–10.5)
Chloride: 109 mEq/L (ref 96–112)
Creatinine, Ser: 0.7 mg/dL (ref 0.4–1.2)
GFR: 111 mL/min (ref >60.00)
Glucose, Bld: 94 mg/dL (ref 70–99)
Potassium: 3.3 mEq/L — ABNORMAL LOW (ref 3.5–5.1)
Sodium: 140 mEq/L (ref 135–145)

## 2012-11-12 MED ORDER — ALBUTEROL SULFATE HFA 108 (90 BASE) MCG/ACT IN AERS: 2.0000 | INHALATION_SPRAY | Freq: Four times a day (QID) | RESPIRATORY_TRACT | Status: DC | PRN

## 2012-11-12 NOTE — Progress Notes (Signed)
Subjective:    Patient ID: Ellen Drake, female    DOB: 23-Sep-1945, 67 y.o.   MRN: [id number]  Cough  67 year old female with past history of hypercholesterolemia, hypertension, Crohn's disease and fibrocystic breast disease who comes in today as a work in for an ER follow up.   She states that symptoms started approximately 5 days ago.  She developed vomiting and diarrhea.  Also had increased abdominal pain.  Went to the ER at [facility].  Had labs and CT scan.  States she was told it was a virus.  They did not think this was a flare of her Crohn's.  She was given IVFs and Zofran.  Instructed to take pepto bismol.  She had one episode of vomiting two days ago, but has not vomited since.  Persistent diarrhea/watery stool.  Also is having persistent abdominal discomfort.  Abdominal pain better.  Described as cramping.  Pepto bismol has helped some.  She is eating toast and drinking gatorade.  No fever.  Still with some cough.  No wheezing or sob.    Past Medical History  Diagnosis Date  . Hypertension   . Hypercholesterolemia   . Crohn's disease   . Fibrocystic breast disease   . Diverticulosis     Current Outpatient Prescriptions on File Prior to Visit  Medication Sig Dispense Refill  . albuterol (PROAIR HFA) 108 (90 BASE) MCG/ACT inhaler Inhale 2 puffs into the lungs every 6 (six) hours as needed for wheezing.      Marland Kitchen aspirin 81 MG tablet Take 81 mg by mouth daily.      Marland Kitchen atenolol (TENORMIN) 50 MG tablet Take 1 tablet (50 mg total) by mouth daily.  30 tablet  0  . brimonidine (ALPHAGAN) 0.2 % ophthalmic solution 1 drop 3 (three) times daily.      . fluticasone (FLONASE) 50 MCG/ACT nasal spray Place 2 sprays into the nose daily.  16 g  0  . fluticasone (FLOVENT HFA) 110 MCG/ACT inhaler Inhale 1 puff into the lungs 2 (two) times daily.  1 Inhaler  0  . latanoprost (XALATAN) 0.005 % ophthalmic solution 1 drop at bedtime.      Marland Kitchen lisinopril (PRINIVIL,ZESTRIL) 20 MG tablet Take 1 tablet (20 mg  total) by mouth daily.  30 tablet  3  . mesalamine (PENTASA) 250 MG CR capsule Take 6 tablets bid      . timolol (BETIMOL) 0.5 % ophthalmic solution 1 drop 2 (two) times daily.      Marland Kitchen amoxicillin-clavulanate (AUGMENTIN) 875-125 MG per tablet Take 1 tablet by mouth 2 (two) times daily.  14 tablet  0  . cefdinir (OMNICEF) 300 MG capsule Take 1 capsule (300 mg total) by mouth 2 (two) times daily.  14 capsule  0  . HYDROcodone-homatropine (HYCODAN) 5-1.5 MG/5ML syrup Take 5 mLs by mouth every 8 (eight) hours as needed for cough.  120 mL  0  . predniSONE (DELTASONE) 10 MG tablet Take 6 tablets x 1 day and then decrease by 1/2 tablet per day until down to zero mg.  39 tablet  0   No current facility-administered medications on file prior to visit.    Review of Systems  Respiratory: Positive for cough.   Patient denies any headache, lightheadedness or dizziness.  No sinus or allegy symptoms.  No chest pain, tightness or palpitations.  No increased shortness of breath.   Previous cough and congestion have improved.  She is still having some residual cough.  Breathing  back to baseline.   No nausea or vomiting now.  Abdominal pain has improved, but still having persistent abdominal cramping.  Persistent loose watery stool.  No blood.         Objective:   Physical Exam  Filed Vitals:   11/11/12 1455  BP: 132/75  Pulse: 61  Temp: 97.7 F (36.5 C)  Resp: 15   67 year old female in no acute distress.   HEENT:  Nares- clear.  Oropharynx - without lesions.       NECK:  Supple.  Nontender.  HEART:  Appears to be regular. LUNGS:  No wheezing or crackles present.  Good breath sounds bilaterally.  Clear.   RADIAL PULSE:  Equal bilaterally.  ABDOMEN:  Soft.  Bowel sounds present and normal.  No audible abdominal bruit.  Minimal tenderness to palpation.  No rebound or guarding.  EXTREMITIES:  No increased edema present.      Assessment & Plan:  GI.  Has a history of Crohns.  With vomiting and  diarrhea earlier in the week.  Vomiting has subsided.  Eating toast and drinking gatorade.  Still with multiple loose stools and abdominal cramping.  Was evaluated in the ER at [facility].  Was told viral.  Reviewed CT.  There was mild terminal ileal wall thickening and mucosal hyperenhancement that was felt may represent a focus of active Crohn's.  No other sites identified.  Will discuss with Dr Redmond Pulling (her gastroenterologist at [facility]).  Will have her review.  She is taking the Pentasa regularly.  Abdominal discomfort has improved.  Stay hydrated.  Bland foods.  Check C. Diff since recent abx.  Recheck cbc and metabolic panel.  Any worsening change or problems, she is to be reevaluated.   PULMONARY.  Previous cough and congestion have improved.  Still with some residual cough.  Continue inhalers.  Follow.       HEALTH MAINTENANCE.  Physical 08/12/12.  Pap performed.  Last mammogram 10/20/11 - BiRADS II.  Scheduled for a follow up mammogram tomorrow.  Colonoscopy 11/16/07.  Due a follow up colonoscopy 4/14.  Already scheduled for the end of this month.

## 2012-11-12 NOTE — Assessment & Plan Note (Signed)
Blood pressure - good control.  Same medication regimen.  Follow.

## 2012-11-12 NOTE — Assessment & Plan Note (Signed)
Unclear if this is a flare.  Will have Dr Redmond Pulling review.  Symptoms have improved.  Plan as outlined.  Follow.

## 2012-11-13 ENCOUNTER — Telehealth: Payer: Self-pay | Admitting: Internal Medicine

## 2012-11-13 NOTE — Telephone Encounter (Signed)
Spoke to Dr Candis Shine (gastroenterology at [facility]).  Sees Breann for her Crohn's.  Discussed case with her.  Does not think c/w a flare of her Crohn's.  Will follow with [place].  See her note.

## 2012-11-15 LAB — CLOSTRIDIUM DIFFICILE BY PCR: Toxigenic C. Difficile by PCR: NOT DETECTED

## 2012-11-17 ENCOUNTER — Encounter: Payer: Self-pay | Admitting: *Deleted

## 2012-11-29 DIAGNOSIS — Z86010 Personal history of colonic polyps: Secondary | ICD-10-CM | POA: Insufficient documentation

## 2012-11-30 HISTORY — PX: COLONOSCOPY W/ BIOPSIES: SHX1374

## 2012-12-21 ENCOUNTER — Encounter: Payer: Self-pay | Admitting: Internal Medicine

## 2013-01-14 ENCOUNTER — Other Ambulatory Visit: Payer: Self-pay | Admitting: *Deleted

## 2013-01-14 MED ORDER — LISINOPRIL 20 MG PO TABS: 20.0000 mg | ORAL_TABLET | Freq: Every day | ORAL | Status: DC

## 2013-01-27 ENCOUNTER — Ambulatory Visit: Payer: Medicare Other | Admitting: Internal Medicine

## 2013-02-03 ENCOUNTER — Encounter: Payer: Self-pay | Admitting: Internal Medicine

## 2013-02-03 ENCOUNTER — Ambulatory Visit (INDEPENDENT_AMBULATORY_CARE_PROVIDER_SITE_OTHER): Payer: Medicare Other | Admitting: Internal Medicine

## 2013-02-03 VITALS — BP 128/70 | HR 64 | Temp 97.5°F | Ht 65.0 in | Wt 157.8 lb

## 2013-02-03 DIAGNOSIS — E7800 Pure hypercholesterolemia, unspecified: Secondary | ICD-10-CM

## 2013-02-03 DIAGNOSIS — J40 Bronchitis, not specified as acute or chronic: Secondary | ICD-10-CM

## 2013-02-03 DIAGNOSIS — I10 Essential (primary) hypertension: Secondary | ICD-10-CM

## 2013-02-03 DIAGNOSIS — K769 Liver disease, unspecified: Secondary | ICD-10-CM

## 2013-02-03 DIAGNOSIS — R945 Abnormal results of liver function studies: Secondary | ICD-10-CM

## 2013-02-05 ENCOUNTER — Encounter: Payer: Self-pay | Admitting: Internal Medicine

## 2013-02-05 NOTE — Assessment & Plan Note (Signed)
Low cholesterol diet and exercise.  Follow lipid panel.

## 2013-02-05 NOTE — Assessment & Plan Note (Signed)
Just saw Dr Redmond Pulling and had f/u colonoscopy.  Currently asymptomatic.

## 2013-02-05 NOTE — Assessment & Plan Note (Signed)
Resolved

## 2013-02-05 NOTE — Assessment & Plan Note (Signed)
Blood pressure - good control.  Same medication regimen.  Follow.

## 2013-02-05 NOTE — Progress Notes (Signed)
Subjective:    Patient ID: Ellen Drake, female    DOB: 09/01/1945, 67 y.o.   MRN: [id number]  HPI 67 year old female with past history of hypercholesterolemia, hypertension, Crohn's disease and fibrocystic breast disease who comes in today for a scheduled follow up.  States she has been doing well.  No abdominal pain or cramping.  Previous diarrhea has resolved.  Bowels stable.  Just saw Dr Redmond Pulling.  Had a colonoscopy 4/14.  States everything checked out fine.  No cardiac symptoms with increased activity or exertion.  Breathing stable.  No cough or congestion.  Joints doing well.     Past Medical History  Diagnosis Date  . Hypertension   . Hypercholesterolemia   . Crohn's disease   . Fibrocystic breast disease   . Diverticulosis     Current Outpatient Prescriptions on File Prior to Visit  Medication Sig Dispense Refill  . albuterol (PROAIR HFA) 108 (90 BASE) MCG/ACT inhaler Inhale 2 puffs into the lungs every 6 (six) hours as needed for wheezing.  6.7 g  1  . aspirin 81 MG tablet Take 81 mg by mouth daily.      Marland Kitchen atenolol (TENORMIN) 50 MG tablet Take 1 tablet (50 mg total) by mouth daily.  30 tablet  0  . brimonidine (ALPHAGAN) 0.2 % ophthalmic solution 1 drop 3 (three) times daily.      . fluticasone (FLONASE) 50 MCG/ACT nasal spray Place 2 sprays into the nose daily.  16 g  0  . fluticasone (FLOVENT HFA) 110 MCG/ACT inhaler Inhale 1 puff into the lungs 2 (two) times daily.  1 Inhaler  0  . latanoprost (XALATAN) 0.005 % ophthalmic solution 1 drop at bedtime.      Marland Kitchen lisinopril (PRINIVIL,ZESTRIL) 20 MG tablet Take 1 tablet (20 mg total) by mouth daily.  30 tablet  5  . mesalamine (PENTASA) 250 MG CR capsule Take 6 tablets bid      . timolol (BETIMOL) 0.5 % ophthalmic solution 1 drop 2 (two) times daily.       No current facility-administered medications on file prior to visit.    Review of Systems Patient denies any headache, lightheadedness or dizziness.  No sinus pressure.  No  chest pain, tightness or palpitations.  No increased shortness of breath, cough or congestion.  No nausea or vomiting.  No abdominal pain or cramping.  No bowel change, such as diarrhea, constipation, BRBPR or melana.  No urine change.  Quit smoking two years ago.  Joints doing well.  Has follow up appt with Dr Jefm Bryant in 10/14.        Objective:   Physical Exam  Filed Vitals:   02/03/13 1125  BP: 128/70  Pulse: 64  Temp: 97.5 F (36.4 C)   Blood pressure recheck:  31/54  67 year old female in no acute distress.   HEENT:  Nares- clear.  Oropharynx - without lesions.   NECK:  Supple.  Nontender.  No audible bruit.  HEART:  Appears to be regular. LUNGS:  No crackles or wheezing audible.  Respirations even and unlabored.  RADIAL PULSE:  Equal bilaterally.  ABDOMEN:  Soft, nontender.  Bowel sounds present and normal.  No audible abdominal bruit. EXTREMITIES:  No increased edema present.  DP pulses palpable and equal bilaterally.           Assessment & Plan:  PULMONARY.  Breathing stable.  Quit smoking two years ago.  Follow.   HEALTH MAINTENANCE.  Physical 08/12/12.  Pap normal at physical.  Last mammogram 10/20/12 - BiRADS II.  Colonoscopy 11/16/07.  Had follow up colonoscopy 4/14.

## 2013-02-23 ENCOUNTER — Ambulatory Visit: Payer: Medicare Other | Admitting: Internal Medicine

## 2013-03-16 ENCOUNTER — Other Ambulatory Visit: Payer: Self-pay | Admitting: Internal Medicine

## 2013-05-06 ENCOUNTER — Ambulatory Visit: Payer: Medicare Other | Admitting: Internal Medicine

## 2013-05-06 ENCOUNTER — Ambulatory Visit (INDEPENDENT_AMBULATORY_CARE_PROVIDER_SITE_OTHER): Payer: Medicare Other | Admitting: Internal Medicine

## 2013-05-06 ENCOUNTER — Encounter: Payer: Self-pay | Admitting: Internal Medicine

## 2013-05-06 VITALS — BP 122/82 | HR 54 | Temp 97.6°F | Ht 65.0 in | Wt 162.2 lb

## 2013-05-06 DIAGNOSIS — Z23 Encounter for immunization: Secondary | ICD-10-CM

## 2013-05-06 DIAGNOSIS — H6190 Disorder of external ear, unspecified, unspecified ear: Secondary | ICD-10-CM

## 2013-05-06 DIAGNOSIS — M129 Arthropathy, unspecified: Secondary | ICD-10-CM

## 2013-05-06 DIAGNOSIS — I10 Essential (primary) hypertension: Secondary | ICD-10-CM

## 2013-05-06 DIAGNOSIS — K769 Liver disease, unspecified: Secondary | ICD-10-CM

## 2013-05-06 DIAGNOSIS — E7800 Pure hypercholesterolemia, unspecified: Secondary | ICD-10-CM

## 2013-05-06 DIAGNOSIS — H6192 Disorder of left external ear, unspecified: Secondary | ICD-10-CM

## 2013-05-06 DIAGNOSIS — M1990 Unspecified osteoarthritis, unspecified site: Secondary | ICD-10-CM

## 2013-05-06 DIAGNOSIS — K5090 Crohn's disease, unspecified, without complications: Secondary | ICD-10-CM

## 2013-05-06 DIAGNOSIS — R945 Abnormal results of liver function studies: Secondary | ICD-10-CM

## 2013-05-06 LAB — LIPID PANEL
Cholesterol: 213 mg/dL — ABNORMAL HIGH (ref 0–200)
HDL: 59 mg/dL (ref >39.00)
Total CHOL/HDL Ratio: 4
Triglycerides: 97 mg/dL (ref 0.0–149.0)
VLDL: 19.4 mg/dL (ref 0.0–40.0)

## 2013-05-06 LAB — BASIC METABOLIC PANEL
BUN: 20 mg/dL (ref 6–23)
CO2: 27 mEq/L (ref 19–32)
Calcium: 9.4 mg/dL (ref 8.4–10.5)
Chloride: 107 mEq/L (ref 96–112)
Creatinine, Ser: 0.8 mg/dL (ref 0.4–1.2)
GFR: 90.58 mL/min (ref >60.00)
Glucose, Bld: 99 mg/dL (ref 70–99)
Potassium: 3.9 mEq/L (ref 3.5–5.1)
Sodium: 140 mEq/L (ref 135–145)

## 2013-05-06 LAB — HEPATIC FUNCTION PANEL
ALT: 17 U/L (ref 0–35)
AST: 20 U/L (ref 0–37)
Albumin: 3.9 g/dL (ref 3.5–5.2)
Alkaline Phosphatase: 59 U/L (ref 39–117)
Bilirubin, Direct: 0.1 mg/dL (ref 0.0–0.3)
Total Bilirubin: 0.8 mg/dL (ref 0.3–1.2)
Total Protein: 7 g/dL (ref 6.0–8.3)

## 2013-05-06 LAB — LDL CHOLESTEROL, DIRECT: Direct LDL: 132.5 mg/dL

## 2013-05-06 MED ORDER — FLUTICASONE PROPIONATE HFA 110 MCG/ACT IN AERO: 1.0000 | INHALATION_SPRAY | Freq: Two times a day (BID) | RESPIRATORY_TRACT | Status: DC

## 2013-05-08 ENCOUNTER — Encounter: Payer: Self-pay | Admitting: Internal Medicine

## 2013-05-08 DIAGNOSIS — H9390 Unspecified disorder of ear, unspecified ear: Secondary | ICD-10-CM | POA: Insufficient documentation

## 2013-05-08 DIAGNOSIS — M1990 Unspecified osteoarthritis, unspecified site: Secondary | ICD-10-CM | POA: Insufficient documentation

## 2013-05-08 NOTE — Assessment & Plan Note (Signed)
Low cholesterol diet and exercise.  Follow lipid panel.

## 2013-05-08 NOTE — Progress Notes (Signed)
Subjective:    Patient ID: Ellen Drake, female    DOB: 03/17/1946, 67 y.o.   MRN: [id number]  HPI 68 year old female with past history of hypercholesterolemia, hypertension, Crohn's disease and fibrocystic breast disease who comes in today for a scheduled follow up.  States she has been doing well.  No abdominal pain or cramping.  Bowels stable.  Just saw Dr Redmond Pulling.  Had a colonoscopy 4/14.  States everything checked out fine.  No cardiac symptoms with increased activity or exertion.  Breathing stable.  No cough or congestion.  Has had some issues with swelling in her right knee.  Saw Dr Jefm Bryant.  Swelling is better.  No calf soreness.  No pain in her knee.  Has follow up in 10/14 with Dr Jefm Bryant.     Past Medical History  Diagnosis Date  . Hypertension   . Hypercholesterolemia   . Crohn's disease   . Fibrocystic breast disease   . Diverticulosis     Current Outpatient Prescriptions on File Prior to Visit  Medication Sig Dispense Refill  . albuterol (PROAIR HFA) 108 (90 BASE) MCG/ACT inhaler Inhale 2 puffs into the lungs every 6 (six) hours as needed for wheezing.  6.7 g  1  . aspirin 81 MG tablet Take 81 mg by mouth daily.      Marland Kitchen atenolol (TENORMIN) 50 MG tablet TAKE 1 TABLET BY MOUTH ONCE A DAY  30 tablet  5  . brimonidine (ALPHAGAN) 0.2 % ophthalmic solution 1 drop 3 (three) times daily.      Marland Kitchen latanoprost (XALATAN) 0.005 % ophthalmic solution 1 drop at bedtime.      Marland Kitchen lisinopril (PRINIVIL,ZESTRIL) 20 MG tablet Take 1 tablet (20 mg total) by mouth daily.  30 tablet  5  . mesalamine (PENTASA) 250 MG CR capsule Take 6 tablets bid      . timolol (BETIMOL) 0.5 % ophthalmic solution 1 drop 2 (two) times daily.      . fluticasone (FLONASE) 50 MCG/ACT nasal spray Place 2 sprays into the nose daily.  16 g  0   No current facility-administered medications on file prior to visit.    Review of Systems Patient denies any headache, lightheadedness or dizziness.  No sinus pressure.  No  chest pain, tightness or palpitations.  No increased shortness of breath, cough or congestion.  No nausea or vomiting.  No abdominal pain or cramping.  No bowel change, such as diarrhea, constipation, BRBPR or melana.  No urine change.  Quit smoking a few years ago.  Right knee swelling as outlined.  Has follow up appt with Dr Jefm Bryant in 10/14.  Is concerned regarding a persistent ear lesion.       Objective:   Physical Exam  Filed Vitals:   05/06/13 0850  BP: 122/82  Pulse: 54  Temp: 97.6 F (49.4 C)   67 year old female in no acute distress.   HEENT:  Nares- clear.  Oropharynx - without lesions.  Hard lesions - left ear.   NECK:  Supple.  Nontender.  No audible bruit.  HEART:  Appears to be regular. LUNGS:  No crackles or wheezing audible.  Respirations even and unlabored.  RADIAL PULSE:  Equal bilaterally.  ABDOMEN:  Soft, nontender.  Bowel sounds present and normal.  No audible abdominal bruit. EXTREMITIES:  No increased edema present.  DP pulses palpable and equal bilaterally.           Assessment & Plan:  PULMONARY.  Breathing stable.  Quit smoking two years ago.  Follow.   HEALTH MAINTENANCE.  Physical 08/12/12.  Pap normal at physical.  Last mammogram 10/20/12 - BiRADS II.  Colonoscopy 11/16/07.  Had follow up colonoscopy 4/14.

## 2013-05-08 NOTE — Assessment & Plan Note (Signed)
Persistent left ear lesion.  Wanted to refer to dermatology.  She wants to hold at this time.

## 2013-05-08 NOTE — Assessment & Plan Note (Signed)
Blood pressure - good control.  Same medication regimen.  Follow.

## 2013-05-08 NOTE — Assessment & Plan Note (Signed)
Just saw Dr Redmond Pulling and had f/u colonoscopy.  Currently asymptomatic.

## 2013-05-08 NOTE — Assessment & Plan Note (Signed)
Right knee swelling recently.  Stable now.  No pain.  Continues to follow up with Dr Jefm Bryant.

## 2013-05-09 ENCOUNTER — Encounter: Payer: Self-pay | Admitting: *Deleted

## 2013-05-26 ENCOUNTER — Telehealth: Payer: Self-pay | Admitting: Internal Medicine

## 2013-05-26 NOTE — Telephone Encounter (Signed)
We are not able to do DMV forms any longer.  Has to be a certified physician.

## 2013-05-26 NOTE — Telephone Encounter (Signed)
Pt notified & would like [place] to mail the forms back to her

## 2013-05-26 NOTE — Telephone Encounter (Signed)
Dropped off paperwork that has to be sent to [facility] by 12/12.  Please fax when complete and contact pt to inform it has been sent.

## 2013-05-26 NOTE — Telephone Encounter (Signed)
In your green folder

## 2013-06-01 NOTE — Telephone Encounter (Signed)
Pt asking about DMV paperwork.  States she called [facility] and was told it is not DOT paperwork, it is just a medical form, and she was told that the doctor should be able to fill it out.  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  Asking for a call to discuss.

## 2013-06-02 NOTE — Telephone Encounter (Signed)
L/m on pt's voicemail about coming in tomorrow at 4 pm

## 2013-06-02 NOTE — Telephone Encounter (Signed)
See if she can come in tomorrow at 4:00 for her paper work to be completed.  Thanks.

## 2013-06-02 NOTE — Telephone Encounter (Signed)
Please advise 

## 2013-06-03 ENCOUNTER — Encounter: Payer: Self-pay | Admitting: Internal Medicine

## 2013-06-03 ENCOUNTER — Ambulatory Visit (INDEPENDENT_AMBULATORY_CARE_PROVIDER_SITE_OTHER): Payer: Medicare Other | Admitting: Internal Medicine

## 2013-06-03 VITALS — BP 120/90 | HR 57 | Temp 97.5°F | Ht 65.0 in | Wt 161.8 lb

## 2013-06-03 DIAGNOSIS — I10 Essential (primary) hypertension: Secondary | ICD-10-CM

## 2013-06-03 DIAGNOSIS — M129 Arthropathy, unspecified: Secondary | ICD-10-CM

## 2013-06-03 DIAGNOSIS — K5090 Crohn's disease, unspecified, without complications: Secondary | ICD-10-CM

## 2013-06-03 DIAGNOSIS — M1990 Unspecified osteoarthritis, unspecified site: Secondary | ICD-10-CM

## 2013-06-12 ENCOUNTER — Encounter: Payer: Self-pay | Admitting: Internal Medicine

## 2013-06-12 NOTE — Assessment & Plan Note (Signed)
Just saw Dr Redmond Pulling and had f/u colonoscopy.  Currently asymptomatic.

## 2013-06-12 NOTE — Assessment & Plan Note (Signed)
Right knee swelling recently.  Stable now.  No pain.  Continues to follow up with Dr Jefm Bryant.   Planning to see ortho.  Does not limit her ability to walk or drive.

## 2013-06-12 NOTE — Progress Notes (Signed)
Subjective:    Patient ID: Ellen Drake, female    DOB: Jan 09, 1946, 67 y.o.   MRN: [id number]  HPI 67 year old female with past history of hypercholesterolemia, hypertension, Crohn's disease and fibrocystic breast disease who comes in today as a work in for driving form completion.   States she has been doing well.  No abdominal pain or cramping.  Bowels stable.  Just saw Dr Redmond Pulling.  Had a colonoscopy 4/14.  States everything checked out fine.  No cardiac symptoms with increased activity or exertion.  Breathing stable.  No cough or congestion.  Has had some issues with swelling in her right knee.  Saw Dr Jefm Bryant.  Swelling is better.  No calf soreness.  No significant pain in her knee.  Swells intermittently.  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  Overall she feels she is doing well.     Past Medical History  Diagnosis Date  . Hypertension   . Hypercholesterolemia   . Crohn's disease   . Fibrocystic breast disease   . Diverticulosis     Current Outpatient Prescriptions on File Prior to Visit  Medication Sig Dispense Refill  . albuterol (PROAIR HFA) 108 (90 BASE) MCG/ACT inhaler Inhale 2 puffs into the lungs every 6 (six) hours as needed for wheezing.  6.7 g  1  . aspirin 81 MG tablet Take 81 mg by mouth daily.      Marland Kitchen atenolol (TENORMIN) 50 MG tablet TAKE 1 TABLET BY MOUTH ONCE A DAY  30 tablet  5  . brimonidine (ALPHAGAN) 0.2 % ophthalmic solution 1 drop 3 (three) times daily.      . fluticasone (FLONASE) 50 MCG/ACT nasal spray Place 2 sprays into the nose daily.  16 g  0  . fluticasone (FLOVENT HFA) 110 MCG/ACT inhaler Inhale 1 puff into the lungs 2 (two) times daily.  1 Inhaler  3  . latanoprost (XALATAN) 0.005 % ophthalmic solution 1 drop at bedtime.      Marland Kitchen lisinopril (PRINIVIL,ZESTRIL) 20 MG tablet Take 1 tablet (20 mg total) by mouth daily.  30 tablet  5  . mesalamine (PENTASA) 250 MG CR capsule Take 6 tablets bid      . timolol (BETIMOL) 0.5 % ophthalmic  solution 1 drop 2 (two) times daily.       No current facility-administered medications on file prior to visit.    Review of Systems Patient denies any headache, lightheadedness or dizziness.  No sinus pressure.  No chest pain, tightness or palpitations. No increased shortness of breath, cough or congestion.  No nausea or vomiting.  No abdominal pain or cramping.  No bowel change, such as diarrhea, constipation, BRBPR or melana.  No urine change.  Quit smoking a few years ago.  Right knee swelling as outlined.  No significant pain.  Sees Dr Jefm Bryant.  Planning to see surgery (ortho).  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       Objective:   Physical Exam  Filed Vitals:   06/03/13 1548  BP: 120/90  Pulse: 57  Temp: 97.5 F (36.4 C)   Blood pressure recheck:  59/24  67 year old female in no acute distress.   HEENT:  Nares- clear.  Oropharynx - without lesions.  Hard lesions - left ear.   NECK:  Supple.  Nontender.  No audible bruit.  HEART:  Appears to be regular. LUNGS:  No crackles or wheezing audible.  Respirations  even and unlabored.  RADIAL PULSE:  Equal bilaterally.  ABDOMEN:  Soft, nontender.  Bowel sounds present and normal.  No audible abdominal bruit. EXTREMITIES:  No increased edema present.  DP pulses palpable and equal bilaterally.   MSK:  No pain with flexion and extension of the right leg.  No decreased strength appreciated.  No increased erythema or warmth.           Assessment & Plan:  PULMONARY.  Breathing stable.  Quit smoking two years ago.  Follow.   HEALTH MAINTENANCE.  Physical 08/12/12.  Pap normal at physical.  Last mammogram 10/20/12 - BiRADS II.  Colonoscopy 11/16/07.  Had follow up colonoscopy 4/14.     I spent 25 minutes with the patient and more than 50% of the time was spent in consultation regarding the above.

## 2013-06-12 NOTE — Assessment & Plan Note (Signed)
Blood pressure elevated on my check today.  Will continue same medication regimen.  Have her spot check her pressure and get her back in soon to reassess.  Will adjust medication if needed.  Will hold on full completion of her form until she returns.    Follow.

## 2013-06-16 ENCOUNTER — Ambulatory Visit: Payer: Self-pay | Admitting: Unknown Physician Specialty

## 2013-06-23 ENCOUNTER — Ambulatory Visit (INDEPENDENT_AMBULATORY_CARE_PROVIDER_SITE_OTHER): Payer: Medicare Other | Admitting: Internal Medicine

## 2013-06-23 ENCOUNTER — Encounter: Payer: Self-pay | Admitting: Internal Medicine

## 2013-06-23 VITALS — BP 130/90 | HR 57 | Temp 97.6°F | Ht 65.0 in | Wt 162.5 lb

## 2013-06-23 DIAGNOSIS — J069 Acute upper respiratory infection, unspecified: Secondary | ICD-10-CM

## 2013-06-23 DIAGNOSIS — I10 Essential (primary) hypertension: Secondary | ICD-10-CM

## 2013-06-23 MED ORDER — CEFUROXIME AXETIL 250 MG PO TABS: 250.0000 mg | ORAL_TABLET | Freq: Two times a day (BID) | ORAL | Status: DC

## 2013-06-23 MED ORDER — LISINOPRIL 40 MG PO TABS: 40.0000 mg | ORAL_TABLET | Freq: Every day | ORAL | Status: DC

## 2013-06-23 NOTE — Progress Notes (Signed)
Pre-visit discussion using our clinic review tool. No additional management support is needed unless otherwise documented below in the visit note.  

## 2013-06-26 ENCOUNTER — Encounter: Payer: Self-pay | Admitting: Internal Medicine

## 2013-06-26 DIAGNOSIS — J069 Acute upper respiratory infection, unspecified: Secondary | ICD-10-CM | POA: Insufficient documentation

## 2013-06-26 NOTE — Assessment & Plan Note (Signed)
Symptoms as outlined.  No wheezing on exam.  Will treat with ceftin as directed.  Saline nasal spray and flonase as directed.  Use Flovent inhaler as directed.  Sample given.  Albuterol inhaler as directed.  Follow.  Get her back in soon to reassess.

## 2013-06-26 NOTE — Progress Notes (Signed)
Subjective:    Patient ID: Ellen Drake, female    DOB: 04-29-1946, 67 y.o.   MRN: [id number]  Cough  67 year old female with past history of hypercholesterolemia, hypertension, Crohn's disease and fibrocystic breast disease who comes in today as a work in for driving form completion and follow up of her blood pressure.  States she has been doing well.  No abdominal pain or cramping.  Bowels stable.  Just saw Dr Redmond Pulling.  Had a colonoscopy 4/14.  States everything checked out fine.  No cardiac symptoms with increased activity or exertion.  Breathing stable.   She has had congestion for the last two weeks.  Productive of yellow mucus.  Not using inhalers.  Has had some issues with swelling in her right knee.  Saw Dr Jefm Bryant.  Had MRI.  Has arthritis.  Swells intermittently.  Planning for surgery in the future.  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  States the orthopedist told her she could drive until her surgery.  Overall she feels she is doing well.  Her blood pressure has been elevated on these recent checks.  She has not been checking it outside the office.     Past Medical History  Diagnosis Date  . Hypertension   . Hypercholesterolemia   . Crohn's disease   . Fibrocystic breast disease   . Diverticulosis     Current Outpatient Prescriptions on File Prior to Visit  Medication Sig Dispense Refill  . albuterol (PROAIR HFA) 108 (90 BASE) MCG/ACT inhaler Inhale 2 puffs into the lungs every 6 (six) hours as needed for wheezing.  6.7 g  1  . aspirin 81 MG tablet Take 81 mg by mouth daily.      Marland Kitchen atenolol (TENORMIN) 50 MG tablet TAKE 1 TABLET BY MOUTH ONCE A DAY  30 tablet  5  . brimonidine (ALPHAGAN) 0.2 % ophthalmic solution 1 drop 3 (three) times daily.      . fluticasone (FLONASE) 50 MCG/ACT nasal spray Place 2 sprays into the nose daily.  16 g  0  . fluticasone (FLOVENT HFA) 110 MCG/ACT inhaler Inhale 1 puff into the lungs 2 (two) times daily.  1 Inhaler  3  .  latanoprost (XALATAN) 0.005 % ophthalmic solution 1 drop at bedtime.      . mesalamine (PENTASA) 250 MG CR capsule Take 6 tablets bid      . timolol (BETIMOL) 0.5 % ophthalmic solution 1 drop 2 (two) times daily.       No current facility-administered medications on file prior to visit.    Review of Systems  Respiratory: Positive for cough.   Patient denies any headache, lightheadedness or dizziness.  No sinus pressure.  No chest pain, tightness or palpitations. No increased shortness of breath.  Does report some increased congestion and productive cough - yellow mucus.  No nausea or vomiting.  No abdominal pain or cramping.  No bowel change, such as diarrhea, constipation, BRBPR or melana.  No urine change.  Quit smoking a few years ago.  Right knee swelling as outlined.   Sees Dr Jefm Bryant.  Planning for surgery in the future.   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       Objective:   Physical Exam  Filed Vitals:   06/23/13 0908  BP: 130/90  Pulse: 57  Temp: 97.6 F (36.4 C)   Blood pressure recheck:  68-47/37  67 year old female in  no acute distress.   HEENT:  Nares- clear.  Oropharynx - without lesions.    NECK:  Supple.  Nontender.  No audible bruit.  HEART:  Appears to be regular. LUNGS:  No crackles or wheezing audible.  Respirations even and unlabored.  RADIAL PULSE:  Equal bilaterally.  ABDOMEN:  Soft, nontender.  Bowel sounds present and normal.  No audible abdominal bruit. EXTREMITIES:  No increased edema present.  DP pulses palpable and equal bilaterally.   MSK:  No decreased strength appreciated.  No increased erythema or warmth.  Some increased soft tissue swelling - around the knee.          Assessment & Plan:  PULMONARY.  Breathing stable.  Quit smoking two years ago.  Follow.   HEALTH MAINTENANCE.  Physical 08/12/12.  Pap normal at physical.  Last mammogram 10/20/12 - BiRADS II.  Colonoscopy 11/16/07.  Had follow up colonoscopy 4/14.

## 2013-06-26 NOTE — Assessment & Plan Note (Signed)
Blood pressure elevated on my check again today.  Will have her increase her lisinopril to 16m q day.   Have her spot check her pressure and get her back in soon to reassess.  Will hold on full completion of her form until she returns.    Follow.

## 2013-06-27 ENCOUNTER — Telehealth: Payer: Self-pay | Admitting: Internal Medicine

## 2013-06-27 NOTE — Telephone Encounter (Signed)
Spoke with Ellen Drake told her dr Nicki Reaper could do preop on 11/28.  No openings on 11/26

## 2013-06-27 NOTE — Telephone Encounter (Signed)
We can do a pre op at her appt on 07/08/13 since 07/06/13 is full.  Let me know if a problem.

## 2013-06-27 NOTE — Telephone Encounter (Signed)
Pt called she has appointment 11/28 for a follow up and wants to know if she could change this appointment to wed 11/26  For surgical clearance.  She is going to have knee surgery  No date set yet.

## 2013-07-08 ENCOUNTER — Ambulatory Visit (INDEPENDENT_AMBULATORY_CARE_PROVIDER_SITE_OTHER): Payer: Medicare Other | Admitting: Internal Medicine

## 2013-07-08 ENCOUNTER — Encounter: Payer: Self-pay | Admitting: Internal Medicine

## 2013-07-08 VITALS — BP 132/90 | HR 64 | Temp 97.6°F | Ht 65.0 in | Wt 160.8 lb

## 2013-07-08 DIAGNOSIS — J069 Acute upper respiratory infection, unspecified: Secondary | ICD-10-CM

## 2013-07-08 DIAGNOSIS — M129 Arthropathy, unspecified: Secondary | ICD-10-CM

## 2013-07-08 DIAGNOSIS — E7800 Pure hypercholesterolemia, unspecified: Secondary | ICD-10-CM

## 2013-07-08 DIAGNOSIS — I10 Essential (primary) hypertension: Secondary | ICD-10-CM

## 2013-07-08 DIAGNOSIS — K5090 Crohn's disease, unspecified, without complications: Secondary | ICD-10-CM

## 2013-07-08 DIAGNOSIS — M1990 Unspecified osteoarthritis, unspecified site: Secondary | ICD-10-CM

## 2013-07-08 DIAGNOSIS — Z01810 Encounter for preprocedural cardiovascular examination: Secondary | ICD-10-CM

## 2013-07-08 NOTE — Progress Notes (Signed)
Subjective:    Patient ID: Ellen Drake, female    DOB: 1946-08-09, 67 y.o.   MRN: [id number]  Cough  67 year old female with past history of hypercholesterolemia, hypertension, Crohn's disease and fibrocystic breast disease who comes in today for a f/u regarding a driving form completion and follow up of her blood pressure.  States she has been doing well except for persistent pain in her right knee.  No abdominal pain or cramping.  Bowels stable.  Just saw Dr Redmond Pulling.  Had a colonoscopy 4/14.  States everything checked out fine.  No cardiac symptoms with increased activity or exertion.  Breathing stable.  The previous cough and congestion have resolved.  No sob.  No wheezing.   Has had some issues with swelling in her right knee.  Saw Dr Jefm Bryant.  Had MRI.  Has arthritis.  Swells intermittently.  Planning for surgery in the near future.  Needs medical clearance.  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 States the orthopedist told her she could drive until her surgery.  Overall she feels she is doing well other than her knee.  Knee is limiting her walking.   Her blood pressure has been elevated on these recent checks.  Adjusted her lisinopril last visit.  Here to follow up regarding her blood pressure.      Past Medical History  Diagnosis Date  . Hypertension   . Hypercholesterolemia   . Crohn's disease   . Fibrocystic breast disease   . Diverticulosis     Current Outpatient Prescriptions on File Prior to Visit  Medication Sig Dispense Refill  . albuterol (PROAIR HFA) 108 (90 BASE) MCG/ACT inhaler Inhale 2 puffs into the lungs every 6 (six) hours as needed for wheezing.  6.7 g  1  . aspirin 81 MG tablet Take 81 mg by mouth daily.      Marland Kitchen atenolol (TENORMIN) 50 MG tablet TAKE 1 TABLET BY MOUTH ONCE A DAY  30 tablet  5  . brimonidine (ALPHAGAN) 0.2 % ophthalmic solution 1 drop 3 (three) times daily.      . fluticasone (FLONASE) 50 MCG/ACT nasal spray Place 2 sprays into  the nose daily.  16 g  0  . latanoprost (XALATAN) 0.005 % ophthalmic solution 1 drop at bedtime.      Marland Kitchen lisinopril (PRINIVIL,ZESTRIL) 40 MG tablet Take 1 tablet (40 mg total) by mouth daily.  90 tablet  1  . mesalamine (PENTASA) 250 MG CR capsule Take 6 tablets bid      . timolol (BETIMOL) 0.5 % ophthalmic solution 1 drop 2 (two) times daily.       No current facility-administered medications on file prior to visit.    Review of Systems  Respiratory: Positive for cough.   Patient denies any headache, lightheadedness or dizziness.  No sinus pressure.  No chest pain, tightness or palpitations. No increased shortness of breath.  The previous cough and congestion have resolved.  No wheezing.   No nausea or vomiting.  No abdominal pain or cramping.  No bowel change, such as diarrhea, constipation, BRBPR or melana.  No urine change.  Quit smoking a few years ago.  Right knee swelling as outlined.   Sees Dr Jefm Bryant.  Planning for surgery in the future.   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  Does limit her walking.  Needs surgery.  Blood pressure better.        Objective:  Physical Exam  Filed Vitals:   07/08/13 0901  BP: 132/90  Pulse: 64  Temp: 97.6 F (36.4 C)   Blood pressure recheck:  45/81  67 year old female in no acute distress.   HEENT:  Nares- clear.  Oropharynx - without lesions.    NECK:  Supple.  Nontender.  No audible bruit.  HEART:  Appears to be regular. LUNGS:  No crackles or wheezing audible.  Respirations even and unlabored.  Good breath sounds bilaterally.  RADIAL PULSE:  Equal bilaterally.  ABDOMEN:  Soft, nontender.  Bowel sounds present and normal.  No audible abdominal bruit. EXTREMITIES:  No increased edema present.  DP pulses palpable and equal bilaterally.   MSK:  No decreased strength appreciated.  No increased erythema or warmth.  Some increased soft tissue swelling - around the right knee.         Assessment & Plan:  PULMONARY.   Breathing stable.  Quit smoking a few years ago.  Follow.  No sob.    FORM COMPLETION.  Driving form completed.  Blood pressure improved.    HEALTH MAINTENANCE.  Physical 08/12/12.  Pap normal at physical.  Last mammogram 10/20/12 - BiRADS II.  Colonoscopy 11/16/07.  Had follow up colonoscopy 4/14.

## 2013-07-08 NOTE — Progress Notes (Signed)
Pre-visit discussion using our clinic review tool. No additional management support is needed unless otherwise documented below in the visit note.  

## 2013-07-10 ENCOUNTER — Encounter: Payer: Self-pay | Admitting: Internal Medicine

## 2013-07-10 NOTE — Assessment & Plan Note (Signed)
Resolved.  Currently asymptomatic.  No cough or congestion.  No sob or wheezing.

## 2013-07-10 NOTE — Assessment & Plan Note (Signed)
Just saw Dr Redmond Pulling and had f/u colonoscopy.  Currently asymptomatic.

## 2013-07-10 NOTE — Assessment & Plan Note (Signed)
Lisinopril was increased to 70m q day last visit.  Blood pressure better today.  Continue her current medication regimen.  Will need close intra op and post op monitoring of her heart rate and blood pressure to avoid extremes.

## 2013-07-10 NOTE — Assessment & Plan Note (Signed)
Low cholesterol diet and exercise.  Follow lipid panel.

## 2013-07-10 NOTE — Assessment & Plan Note (Signed)
Right knee swelling recently.  Saw ortho.  Planning for surgery soon.  Increased pain affecting her walking.  Needs surgery.  Here for pre op evaluation.  EKG obtained and revealed SR with no acute ischemic changes.  She is currently asymptomatic.  Given this and EKG, I feel she is at low risk from a cardiac standpoint to proceed with the planned surgery.  Will need close intra op and post op monitoring of her heart rate and blood pressure to avoid extremes.  Continue inhalers regularly.  Will need to use inhalers regularly in the peri op /post op period.

## 2013-07-28 ENCOUNTER — Ambulatory Visit: Payer: Self-pay | Admitting: Unknown Physician Specialty

## 2013-08-17 ENCOUNTER — Encounter: Payer: Medicare Other | Admitting: Internal Medicine

## 2013-09-20 ENCOUNTER — Other Ambulatory Visit: Payer: Self-pay | Admitting: Internal Medicine

## 2013-12-19 ENCOUNTER — Encounter (INDEPENDENT_AMBULATORY_CARE_PROVIDER_SITE_OTHER): Payer: Self-pay

## 2013-12-19 ENCOUNTER — Ambulatory Visit (INDEPENDENT_AMBULATORY_CARE_PROVIDER_SITE_OTHER): Payer: Medicare Other | Admitting: Internal Medicine

## 2013-12-19 ENCOUNTER — Encounter: Payer: Self-pay | Admitting: Internal Medicine

## 2013-12-19 VITALS — BP 130/90 | HR 61 | Temp 97.6°F | Ht 65.0 in | Wt 154.8 lb

## 2013-12-19 DIAGNOSIS — I10 Essential (primary) hypertension: Secondary | ICD-10-CM

## 2013-12-19 DIAGNOSIS — J069 Acute upper respiratory infection, unspecified: Secondary | ICD-10-CM

## 2013-12-19 MED ORDER — FLUTICASONE PROPIONATE 50 MCG/ACT NA SUSP: 2.0000 | Freq: Every day | NASAL | Status: DC

## 2013-12-19 MED ORDER — CEFUROXIME AXETIL 250 MG PO TABS: 250.0000 mg | ORAL_TABLET | Freq: Two times a day (BID) | ORAL | Status: DC

## 2013-12-19 MED ORDER — ALBUTEROL SULFATE HFA 108 (90 BASE) MCG/ACT IN AERS: 2.0000 | INHALATION_SPRAY | Freq: Four times a day (QID) | RESPIRATORY_TRACT | Status: DC | PRN

## 2013-12-19 MED ORDER — PREDNISONE 10 MG PO TABS: ORAL_TABLET | ORAL | Status: DC

## 2013-12-19 NOTE — Progress Notes (Signed)
Pre visit review using our clinic review tool, if applicable. No additional management support is needed unless otherwise documented below in the visit note. 

## 2013-12-19 NOTE — Patient Instructions (Signed)
Saline nasal spray - flush nose at least 2-3x/day.  Flonase nasal spray - 2 sprays each nostril one time per day (do this in the evening).  Prednisone taper as directed.   Take the antibiotic - 2x/day.

## 2013-12-20 ENCOUNTER — Encounter: Payer: Self-pay | Admitting: Internal Medicine

## 2013-12-20 NOTE — Assessment & Plan Note (Signed)
On Lisinopril 61m q day.  Blood pressure slightly elevated.  Continue her current medication regimen.  Treat infection.  Follow.  Get her back in soon to reassess.

## 2013-12-20 NOTE — Assessment & Plan Note (Signed)
Increased cough and congestion.  No sob or wheezing.  Exam as outlined.  Treat with ceftin as directed.  Prednisone taper starting at 62m and decreasing by 522mper day until off.  Saline nasal spray and flonase nasal spray as directed.  Follow.

## 2013-12-20 NOTE — Progress Notes (Signed)
  Subjective:    Patient ID: Ellen Drake, female    DOB: 1946-04-26, 68 y.o.   MRN: [id number]  URI  Associated symptoms include coughing.  Cough  68 year old female with past history of hypercholesterolemia, hypertension, Crohn's disease and fibrocystic breast disease who comes in today as a work in with concerns regarding increased cough and congestion.  States symptoms started last week.  Started with sore throat.  Describes increased sneezing and nasal congestion.  Increased cough.  Some productive mucus production.  Question of wheezing.  No chest tightness or sob.  No nausea or vomiting.       Past Medical History  Diagnosis Date  . Hypertension   . Hypercholesterolemia   . Crohn's disease   . Fibrocystic breast disease   . Diverticulosis     Current Outpatient Prescriptions on File Prior to Visit  Medication Sig Dispense Refill  . aspirin 81 MG tablet Take 81 mg by mouth daily.      Marland Kitchen atenolol (TENORMIN) 50 MG tablet Take 1 tablet (50 mg total) by mouth daily. NEED TO RESCHEDULE PHYSICAL FOR FURTHER REFILS  30 tablet  3  . brimonidine (ALPHAGAN) 0.2 % ophthalmic solution 1 drop 3 (three) times daily.      Marland Kitchen latanoprost (XALATAN) 0.005 % ophthalmic solution 1 drop at bedtime.      Marland Kitchen lisinopril (PRINIVIL,ZESTRIL) 40 MG tablet Take 1 tablet (40 mg total) by mouth daily.  90 tablet  1  . mesalamine (PENTASA) 250 MG CR capsule Take 6 tablets bid      . timolol (BETIMOL) 0.5 % ophthalmic solution 1 drop 2 (two) times daily.       No current facility-administered medications on file prior to visit.    Review of Systems  Respiratory: Positive for cough.   Patient denies any headache, lightheadedness or dizziness.  No sinus pressure.  No chest pain, tightness or palpitations. No increased shortness of breath.  Reports the increased nasal congestion and cough as outlined.  No nausea or vomiting.  No abdominal pain or cramping.  No bowel change.  Quit smoking a few years ago.       Objective:   Physical Exam  Filed Vitals:   12/19/13 1002  BP: 130/90  Pulse: 61  Temp: 97.6 F (36.4 C)   Blood pressure recheck:  26/3  68 year old female in no acute distress.   HEENT:  Nares- clear except for slightly erythematous turbinates.  Oropharynx - without lesions.  No sinus tenderness to palpation.   NECK:  Supple.  Nontender.    HEART:  Appears to be regular. LUNGS:  No crackles or wheezing audible.  Respirations even and unlabored.  Some increased cough with forced expiration.  RADIAL PULSE:  Equal bilaterally.  ABDOMEN:  Soft, nontender.  Bowel sounds present and normal.  No audible abdominal bruit.        Assessment & Plan:  PULMONARY.  Quit smoking a few years ago.  Follow.  No sob.  Treat current infection.   HEALTH MAINTENANCE.  Physical 08/12/12.  Pap normal at physical.  Last mammogram 10/20/12 - BiRADS II.  Colonoscopy 11/16/07.  Had follow up colonoscopy 4/14.

## 2013-12-21 ENCOUNTER — Telehealth: Payer: Self-pay | Admitting: Internal Medicine

## 2013-12-21 NOTE — Telephone Encounter (Signed)
Opened in error

## 2013-12-21 NOTE — Telephone Encounter (Signed)
Pt notified and verbalized understanding. She is using her inhaler, and taking prednisone and abx. Called Rx to [facility], advised to call back with failure of improvement and she would need to be seen with persisting symptoms

## 2013-12-21 NOTE — Telephone Encounter (Signed)
Please advise 

## 2013-12-21 NOTE — Telephone Encounter (Signed)
Make sure she is using her inhaler and taking her prednisone and abx.  Can call in tessalon perles 146m tid prn cough #21 with no refills.  If persistent problems, she will need to be reevaluated.

## 2013-12-21 NOTE — Telephone Encounter (Signed)
Pt left vm.  States she saw Dr. Nicki Reaper 5/11.  Asking if Dr. Nicki Reaper could call in some cough medicine for her.

## 2013-12-26 ENCOUNTER — Ambulatory Visit (INDEPENDENT_AMBULATORY_CARE_PROVIDER_SITE_OTHER): Payer: Medicare Other | Admitting: Adult Health

## 2013-12-26 ENCOUNTER — Encounter: Payer: Self-pay | Admitting: Adult Health

## 2013-12-26 ENCOUNTER — Ambulatory Visit (INDEPENDENT_AMBULATORY_CARE_PROVIDER_SITE_OTHER)
Admission: RE | Admit: 2013-12-26 | Discharge: 2013-12-26 | Disposition: A | Discharge status: Home / Self Care | Payer: Medicare Other | Source: Ambulatory Visit | Attending: Adult Health | Admitting: Adult Health

## 2013-12-26 VITALS — BP 140/88 | HR 64 | Temp 97.6°F | Resp 16 | Wt 150.5 lb

## 2013-12-26 DIAGNOSIS — R059 Cough, unspecified: Secondary | ICD-10-CM

## 2013-12-26 DIAGNOSIS — R05 Cough: Secondary | ICD-10-CM

## 2013-12-26 MED ORDER — GUAIFENESIN-CODEINE 100-10 MG/5ML PO SOLN: 5.0000 mL | Freq: Three times a day (TID) | ORAL | Status: DC | PRN

## 2013-12-26 MED ORDER — LEVOFLOXACIN 500 MG PO TABS: 500.0000 mg | ORAL_TABLET | Freq: Every day | ORAL | Status: DC

## 2013-12-26 NOTE — Progress Notes (Signed)
   Subjective:    Patient ID: MONCIA ANNAS, female    DOB: [birth year], 68 y.o.   MRN: [id number]  HPI Pt is a 68 y/o female who presents to clinic with worsening cough, congestion and "just not feeling well". She is on a prednisone taper and is finishing antibiotics. She reports cough is keeping her up at night. She saw Dr Nicki Reaper on 12/19/13.   Current Outpatient Prescriptions on File Prior to Visit  Medication Sig Dispense Refill  . albuterol (PROAIR HFA) 108 (90 BASE) MCG/ACT inhaler Inhale 2 puffs into the lungs every 6 (six) hours as needed for wheezing.  8.5 g  1  . aspirin 81 MG tablet Take 81 mg by mouth daily.      Marland Kitchen atenolol (TENORMIN) 50 MG tablet Take 1 tablet (50 mg total) by mouth daily. NEED TO RESCHEDULE PHYSICAL FOR FURTHER REFILS  30 tablet  3  . brimonidine (ALPHAGAN) 0.2 % ophthalmic solution 1 drop 3 (three) times daily.      . cefUROXime (CEFTIN) 250 MG tablet Take 1 tablet (250 mg total) by mouth 2 (two) times daily with a meal.  20 tablet  0  . fluticasone (FLONASE) 50 MCG/ACT nasal spray Place 2 sprays into both nostrils daily.  16 g  2  . latanoprost (XALATAN) 0.005 % ophthalmic solution 1 drop at bedtime.      Marland Kitchen lisinopril (PRINIVIL,ZESTRIL) 40 MG tablet Take 1 tablet (40 mg total) by mouth daily.  90 tablet  1  . mesalamine (PENTASA) 250 MG CR capsule Take 6 tablets bid      . predniSONE (DELTASONE) 10 MG tablet Take 4 tablets x 1 day and then decrease by 1/2 tablet per day until down to zero mg.  18 tablet  0  . timolol (BETIMOL) 0.5 % ophthalmic solution 1 drop 2 (two) times daily.       No current facility-administered medications on file prior to visit.    Review of Systems  Constitutional: Positive for fever (low grade). Negative for chills.  HENT: Positive for congestion and postnasal drip. Negative for sore throat.   Respiratory: Positive for cough. Negative for wheezing.   Cardiovascular: Negative.   All other systems reviewed and are negative.     Objective:   Physical Exam  Constitutional: She is oriented to person, place, and time.  Appears acutely ill  HENT:  Head: Normocephalic and atraumatic.  Pharyngeal erythema  Cardiovascular: Normal rate, regular rhythm and normal heart sounds.  Exam reveals no gallop and no friction rub.   No murmur heard. Pulmonary/Chest: Effort normal and breath sounds normal. No respiratory distress. She has no wheezes. She has no rales.  Musculoskeletal: Normal range of motion.  Lymphadenopathy:    She has no cervical adenopathy.  Neurological: She is alert and oriented to person, place, and time.  Skin: Skin is warm and dry.  Psychiatric: She has a normal mood and affect. Her behavior is normal. Judgment and thought content normal.      Assessment & Plan:   1. Cough Currently of prednisone taper and ceftin. Ongoing cough, congestion. Chest xray. Start levaquin 500 mg x 5 days. RTC if no improvement within 4-5 days. - DG Chest 2 View; Future

## 2013-12-26 NOTE — Progress Notes (Signed)
Pre visit review using our clinic review tool, if applicable. No additional management support is needed unless otherwise documented below in the visit note. 

## 2013-12-26 NOTE — Patient Instructions (Signed)
  Start Levaquin 500 mg daily for 5 days.  Robitussin-AC for severe cough. This medication contains codeine and will cause sedation. Do not drive while taking this medication.  Continue your prednisone taper.  Please go to our [facility] office for a chest x-ray

## 2013-12-27 ENCOUNTER — Other Ambulatory Visit: Payer: Self-pay | Admitting: Internal Medicine

## 2014-01-09 ENCOUNTER — Encounter: Payer: Self-pay | Admitting: Internal Medicine

## 2014-01-09 ENCOUNTER — Ambulatory Visit (INDEPENDENT_AMBULATORY_CARE_PROVIDER_SITE_OTHER): Payer: Medicare Other | Admitting: Internal Medicine

## 2014-01-09 VITALS — BP 130/80 | HR 58 | Temp 97.9°F | Ht 64.5 in | Wt 152.0 lb

## 2014-01-09 DIAGNOSIS — I10 Essential (primary) hypertension: Secondary | ICD-10-CM

## 2014-01-09 DIAGNOSIS — E7800 Pure hypercholesterolemia, unspecified: Secondary | ICD-10-CM

## 2014-01-09 DIAGNOSIS — D649 Anemia, unspecified: Secondary | ICD-10-CM

## 2014-01-09 DIAGNOSIS — Z1239 Encounter for other screening for malignant neoplasm of breast: Secondary | ICD-10-CM

## 2014-01-09 DIAGNOSIS — J069 Acute upper respiratory infection, unspecified: Secondary | ICD-10-CM

## 2014-01-09 DIAGNOSIS — K5090 Crohn's disease, unspecified, without complications: Secondary | ICD-10-CM

## 2014-01-09 LAB — LIPID PANEL
Cholesterol: 203 mg/dL — ABNORMAL HIGH (ref 0–200)
HDL: 54.1 mg/dL (ref >39.00)
LDL Cholesterol: 103 mg/dL — ABNORMAL HIGH (ref 0–99)
Total CHOL/HDL Ratio: 4
Triglycerides: 232 mg/dL — ABNORMAL HIGH (ref 0.0–149.0)
VLDL: 46.4 mg/dL — ABNORMAL HIGH (ref 0.0–40.0)

## 2014-01-09 LAB — CBC WITH DIFFERENTIAL/PLATELET
Basophils Absolute: 0 10*3/uL (ref 0.0–0.1)
Basophils Relative: 0.4 % (ref 0.0–3.0)
Eosinophils Absolute: 0.1 10*3/uL (ref 0.0–0.7)
Eosinophils Relative: 1.1 % (ref 0.0–5.0)
HCT: 36.2 % (ref 36.0–46.0)
Hemoglobin: 12.1 g/dL (ref 12.0–15.0)
Lymphocytes Relative: 29.4 % (ref 12.0–46.0)
Lymphs Abs: 2.7 10*3/uL (ref 0.7–4.0)
MCHC: 33.3 g/dL (ref 30.0–36.0)
MCV: 89.2 fl (ref 78.0–100.0)
Monocytes Absolute: 0.6 10*3/uL (ref 0.1–1.0)
Monocytes Relative: 6.3 % (ref 3.0–12.0)
Neutro Abs: 5.7 10*3/uL (ref 1.4–7.7)
Neutrophils Relative %: 62.8 % (ref 43.0–77.0)
Platelets: 242 10*3/uL (ref 150.0–400.0)
RBC: 4.06 Mil/uL (ref 3.87–5.11)
RDW: 14.3 % (ref 11.5–15.5)
WBC: 9.1 10*3/uL (ref 4.0–10.5)

## 2014-01-09 LAB — FERRITIN: Ferritin: 45.9 ng/mL (ref 10.0–291.0)

## 2014-01-09 LAB — TSH: TSH: 0.29 u[IU]/mL — ABNORMAL LOW (ref 0.35–4.50)

## 2014-01-09 NOTE — Progress Notes (Signed)
Pre visit review using our clinic review tool, if applicable. No additional management support is needed unless otherwise documented below in the visit note. 

## 2014-01-10 ENCOUNTER — Other Ambulatory Visit: Payer: Self-pay | Admitting: Internal Medicine

## 2014-01-10 ENCOUNTER — Encounter: Payer: Self-pay | Admitting: Internal Medicine

## 2014-01-10 ENCOUNTER — Other Ambulatory Visit (INDEPENDENT_AMBULATORY_CARE_PROVIDER_SITE_OTHER): Payer: Medicare Other

## 2014-01-10 DIAGNOSIS — R7989 Other specified abnormal findings of blood chemistry: Secondary | ICD-10-CM

## 2014-01-10 DIAGNOSIS — R946 Abnormal results of thyroid function studies: Secondary | ICD-10-CM

## 2014-01-10 LAB — T4, FREE: Free T4: 0.75 ng/dL (ref 0.60–1.60)

## 2014-01-10 LAB — T3, FREE: T3, Free: 2.4 pg/mL (ref 2.3–4.2)

## 2014-01-10 NOTE — Assessment & Plan Note (Signed)
Resolved

## 2014-01-10 NOTE — Assessment & Plan Note (Signed)
On Lisinopril 5m q day.  Blood pressure slightly elevated on my check.  Better on outside checks and initial check here.  Have her spot check her pressure.   Continue her current medication regimen.  Get her back in soon to reassess.

## 2014-01-10 NOTE — Assessment & Plan Note (Addendum)
Just saw Dr Redmond Pulling for abdominal pain and rectal bleeding.  Felt to be related to diclofenac.  Had f/u colonoscopy 4/14 - ok.   Recommended f/u colonoscopy in five years.  Currently asymptomatic.  Continue f/u with Dr Redmond Pulling.

## 2014-01-10 NOTE — Assessment & Plan Note (Signed)
Low cholesterol diet and exercise.  Follow lipid panel.

## 2014-01-10 NOTE — Progress Notes (Signed)
Order placed for free t3 and free t4

## 2014-01-10 NOTE — Progress Notes (Signed)
Subjective:    Patient ID: Ellen Drake, female    DOB: 12-Jan-1946, 68 y.o.   MRN: [id number]  HPI 68 year old female with past history of hypercholesterolemia, hypertension, Crohn's disease and fibrocystic breast disease who comes in today to follow up on these issues as well as for a complete physical exam.  States she is doing better now.  Cough and congestion resolved.  Was recently evaluated at [facility] for abdominal cramping and rectal bleeding.  See Dr Dois Davenport note for details.  Was evaluated by Dr Redmond Pulling.  Was felt to be related to diclofenac.  Has had not further problems since her [facility] visit.   No abdominal pain or cramping.  Bowels stable.  Had a colonoscopy 4/14.  States everything checked out fine.  No cardiac symptoms with increased activity or exertion.  Breathing stable.  Eating and drinking well.  She is s/p knee surgery.  Doing well.  Decreased pain and swelling.  Doing her exercises at home.  Overall she feels she is doing well.  States her blood pressure is averaging 130-132/80.    Past Medical History  Diagnosis Date  . Hypertension   . Hypercholesterolemia   . Crohn's disease   . Fibrocystic breast disease   . Diverticulosis     Current Outpatient Prescriptions on File Prior to Visit  Medication Sig Dispense Refill  . albuterol (PROAIR HFA) 108 (90 BASE) MCG/ACT inhaler Inhale 2 puffs into the lungs every 6 (six) hours as needed for wheezing.  8.5 g  1  . aspirin 81 MG tablet Take 81 mg by mouth daily.      Marland Kitchen atenolol (TENORMIN) 50 MG tablet Take 1 tablet (50 mg total) by mouth daily. NEED TO RESCHEDULE PHYSICAL FOR FURTHER REFILS  30 tablet  3  . brimonidine (ALPHAGAN) 0.2 % ophthalmic solution 1 drop 3 (three) times daily.      . fluticasone (FLONASE) 50 MCG/ACT nasal spray Place 2 sprays into both nostrils daily.  16 g  2  . latanoprost (XALATAN) 0.005 % ophthalmic solution 1 drop at bedtime.      Marland Kitchen lisinopril (PRINIVIL,ZESTRIL) 40 MG tablet TAKE 1 TABLET (40 MG  TOTAL) BY MOUTH DAILY.  90 tablet  1  . mesalamine (PENTASA) 250 MG CR capsule Take 6 tablets bid      . timolol (BETIMOL) 0.5 % ophthalmic solution 1 drop 2 (two) times daily.       No current facility-administered medications on file prior to visit.    Review of Systems Patient denies any headache, lightheadedness or dizziness.  No sinus pressure.  No chest pain, tightness or palpitations. No increased shortness of breath, cough or congestion.  Cough and congestion resolved.  No nausea or vomiting.  No abdominal pain or cramping now.  No bowel change, such as diarrhea, constipation - now.  No further bleeding.  No urine change.  Quit smoking a few years ago.  S/p right knee surgery.  Doing well.  Continues to f/u with Dr Jefm Bryant.        Objective:   Physical Exam  Filed Vitals:   01/09/14 0955  BP: 130/80  Pulse: 58  Temp: 97.9 F (36.6 C)   Blood pressure recheck:  18/49-1  68 year old female in no acute distress.   HEENT:  Nares- clear.  Oropharynx - without lesions. NECK:  Supple.  Nontender.  No audible bruit.  HEART:  Appears to be regular. LUNGS:  No crackles or wheezing audible.  Respirations  even and unlabored.  RADIAL PULSE:  Equal bilaterally.    BREASTS:  No nipple discharge or nipple retraction present.  Could not appreciate any distinct nodules or axillary adenopathy.  ABDOMEN:  Soft, nontender.  Bowel sounds present and normal.  No audible abdominal bruit.  GU:  Not performed.     EXTREMITIES:  No increased edema present.  DP pulses palpable and equal bilaterally.      MSK:  Well healed incision site.  Swelling improved.        Assessment & Plan:  PULMONARY.  Breathing stable.  Quit smoking a few years ago.  Follow.   MSK.  S/p knee surgery and doing well.  Continue to f/u with Dr Leanor Kail.    HEALTH MAINTENANCE.  Physical today.  Pap normal 08/12/12.  Last mammogram 10/20/12 - BiRADS II.  Schedule a f/u mammogram.  Colonoscopy 11/16/07.  Had follow up  colonoscopy 4/14.     I spent 25 minutes with the patient and more than 50% of the time was spent in consultation regarding the above.

## 2014-01-11 ENCOUNTER — Encounter: Payer: Self-pay | Admitting: *Deleted

## 2014-01-11 ENCOUNTER — Other Ambulatory Visit: Payer: Self-pay | Admitting: Internal Medicine

## 2014-01-13 ENCOUNTER — Ambulatory Visit: Payer: Medicare Other | Admitting: Internal Medicine

## 2014-02-14 ENCOUNTER — Other Ambulatory Visit (INDEPENDENT_AMBULATORY_CARE_PROVIDER_SITE_OTHER): Payer: Medicare Other

## 2014-02-14 ENCOUNTER — Other Ambulatory Visit: Payer: Self-pay | Admitting: Internal Medicine

## 2014-02-14 DIAGNOSIS — R7989 Other specified abnormal findings of blood chemistry: Secondary | ICD-10-CM

## 2014-02-14 DIAGNOSIS — R946 Abnormal results of thyroid function studies: Secondary | ICD-10-CM

## 2014-02-14 NOTE — Progress Notes (Signed)
Order placed for f/u thyroid function tests.

## 2014-02-15 LAB — T3, FREE: T3, Free: 3.1 pg/mL (ref 2.3–4.2)

## 2014-02-15 LAB — TSH: TSH: 0.8 u[IU]/mL (ref 0.35–4.50)

## 2014-02-15 LAB — T4, FREE: Free T4: 0.88 ng/dL (ref 0.60–1.60)

## 2014-02-16 ENCOUNTER — Encounter: Payer: Self-pay | Admitting: *Deleted

## 2014-03-10 ENCOUNTER — Ambulatory Visit: Payer: Self-pay | Admitting: Unknown Physician Specialty

## 2014-03-10 HISTORY — PX: KNEE ARTHROSCOPY W/ SYNOVECTOMY: SHX1887

## 2014-03-16 DIAGNOSIS — M659 Synovitis and tenosynovitis, unspecified: Secondary | ICD-10-CM | POA: Insufficient documentation

## 2014-04-11 ENCOUNTER — Ambulatory Visit (INDEPENDENT_AMBULATORY_CARE_PROVIDER_SITE_OTHER): Payer: Medicare Other | Admitting: Internal Medicine

## 2014-04-11 ENCOUNTER — Encounter: Payer: Self-pay | Admitting: Internal Medicine

## 2014-04-11 VITALS — BP 120/80 | HR 55 | Temp 97.7°F | Ht 64.5 in | Wt 153.5 lb

## 2014-04-11 DIAGNOSIS — I10 Essential (primary) hypertension: Secondary | ICD-10-CM

## 2014-04-11 DIAGNOSIS — K5090 Crohn's disease, unspecified, without complications: Secondary | ICD-10-CM

## 2014-04-11 DIAGNOSIS — E7800 Pure hypercholesterolemia, unspecified: Secondary | ICD-10-CM

## 2014-04-11 DIAGNOSIS — M129 Arthropathy, unspecified: Secondary | ICD-10-CM

## 2014-04-11 DIAGNOSIS — M1990 Unspecified osteoarthritis, unspecified site: Secondary | ICD-10-CM

## 2014-04-11 LAB — HEPATIC FUNCTION PANEL
ALT: 13 U/L (ref 0–35)
AST: 21 U/L (ref 0–37)
Albumin: 3.9 g/dL (ref 3.5–5.2)
Alkaline Phosphatase: 82 U/L (ref 39–117)
Bilirubin, Direct: 0.1 mg/dL (ref 0.0–0.3)
Total Bilirubin: 0.5 mg/dL (ref 0.2–1.2)
Total Protein: 6.9 g/dL (ref 6.0–8.3)

## 2014-04-11 LAB — BASIC METABOLIC PANEL
BUN: 17 mg/dL (ref 6–23)
CO2: 27 mEq/L (ref 19–32)
Calcium: 9.6 mg/dL (ref 8.4–10.5)
Chloride: 110 mEq/L (ref 96–112)
Creatinine, Ser: 0.7 mg/dL (ref 0.4–1.2)
GFR: 105.16 mL/min (ref >60.00)
Glucose, Bld: 70 mg/dL (ref 70–99)
Potassium: 3.9 mEq/L (ref 3.5–5.1)
Sodium: 145 mEq/L (ref 135–145)

## 2014-04-11 NOTE — Progress Notes (Signed)
Pre visit review using our clinic review tool, if applicable. No additional management support is needed unless otherwise documented below in the visit note. 

## 2014-04-11 NOTE — Progress Notes (Signed)
Subjective:    Patient ID: Ellen Drake, female    DOB: [birth year], 68 y.o.   MRN: [id number]  HPI 68 year old female with past history of hypercholesterolemia, hypertension, Crohn's disease and fibrocystic breast disease who comes in today for a scheduled follow up.  Has had no abdominal pain or cramping.  Bowels stable.  Had a colonoscopy 4/14.  States everything checked out fine.  No cardiac symptoms with increased activity or exertion.  Breathing stable.  Eating and drinking well.  No increased cough or congestion.  She is s/p knee surgery.  Just had second knee surgery 03/10/14 - arthroscopy.  Going to physical therapy.  Still with pain.  Is better.  Due to see Dr Jefm Bryant today.  States her blood pressure has been doing well.  Last check 128/80.     Past Medical History  Diagnosis Date  . Hypertension   . Hypercholesterolemia   . Crohn's disease   . Fibrocystic breast disease   . Diverticulosis     Current Outpatient Prescriptions on File Prior to Visit  Medication Sig Dispense Refill  . albuterol (PROAIR HFA) 108 (90 BASE) MCG/ACT inhaler Inhale 2 puffs into the lungs every 6 (six) hours as needed for wheezing.  8.5 g  1  . aspirin 81 MG tablet Take 81 mg by mouth daily.      Marland Kitchen atenolol (TENORMIN) 50 MG tablet Take 1 tablet (50 mg total) by mouth daily.  30 tablet  5  . brimonidine (ALPHAGAN) 0.2 % ophthalmic solution 1 drop 3 (three) times daily.      . fluticasone (FLONASE) 50 MCG/ACT nasal spray Place 2 sprays into both nostrils daily.  16 g  2  . latanoprost (XALATAN) 0.005 % ophthalmic solution 1 drop at bedtime.      Marland Kitchen lisinopril (PRINIVIL,ZESTRIL) 40 MG tablet TAKE 1 TABLET (40 MG TOTAL) BY MOUTH DAILY.  90 tablet  1  . mesalamine (PENTASA) 250 MG CR capsule Take 6 tablets bid      . timolol (BETIMOL) 0.5 % ophthalmic solution 1 drop 2 (two) times daily.       No current facility-administered medications on file prior to visit.    Review of Systems Patient denies any  headache, lightheadedness or dizziness.  No sinus pressure.  No chest pain, tightness or palpitations. No increased shortness of breath, cough or congestion.    No nausea or vomiting.  No abdominal pain or cramping.  No bowel change, such as diarrhea, constipation - now.  No further bleeding.  No urine change.  Quit smoking a few years ago.  S/p right knee surgery x 2.  Due to f/u with Dr Jefm Bryant today.  Doing physical therapy.  Having trouble affording her pentasa.  Dr Redmond Pulling has been providing her with samples.        Objective:   Physical Exam  Filed Vitals:   04/11/14 0959  BP: 120/80  Pulse: 55  Temp: 97.7 F (36.5 C)   Blood pressure recheck:  63-41/60  68 year old female in no acute distress.   HEENT:  Nares- clear.  Oropharynx - without lesions. NECK:  Supple.  Nontender.  No audible bruit.  HEART:  Appears to be regular. LUNGS:  No crackles or wheezing audible.  Respirations even and unlabored.  RADIAL PULSE:  Equal bilaterally. ABDOMEN:  Soft, nontender.  Bowel sounds present and normal.  No audible abdominal bruit.    EXTREMITIES:  No increased edema present.  DP pulses palpable  and equal bilaterally.      MSK:  Well healed incision site.  Swelling improved.        Assessment & Plan:  PULMONARY.  Breathing stable.  Quit smoking a few years ago.  Follow.   MSK.  S/p knee surgery and doing well.  Continue to f/u with Dr Leanor Kail.    HEALTH MAINTENANCE.  Physical 01/09/14.  Pap normal 08/12/12.  Last mammogram 10/20/12 - BiRADS II.  Ordered a f/u mammogram last visit.  Need results.  Colonoscopy 11/16/07.  Had follow up colonoscopy 4/14.     I spent 25 minutes with the patient and more than 50% of the time was spent in consultation regarding the above.

## 2014-04-12 ENCOUNTER — Encounter: Payer: Self-pay | Admitting: *Deleted

## 2014-04-16 ENCOUNTER — Telehealth: Payer: Self-pay | Admitting: Internal Medicine

## 2014-04-16 ENCOUNTER — Encounter: Payer: Self-pay | Admitting: Internal Medicine

## 2014-04-16 NOTE — Assessment & Plan Note (Signed)
Had f/u colonoscopy 4/14 - ok.   Recommended f/u colonoscopy in five years.  Currently asymptomatic.  Continue f/u with Dr Redmond Pulling.

## 2014-04-16 NOTE — Assessment & Plan Note (Signed)
Is s/p second knee surgery.  Doing therapy.  Due to see Dr Jefm Bryant today.  Still with increased pain.  Plans to discuss with him.

## 2014-04-16 NOTE — Assessment & Plan Note (Signed)
Low cholesterol diet and exercise.  Follow lipid panel.

## 2014-04-16 NOTE — Assessment & Plan Note (Signed)
On Lisinopril 37m q day.  Blood pressure slightly elevated on my initial check.  Better on outside checks and initial check here.  Have her to continue to spot check her pressure.   Continue her current medication regimen.   Recheck prior to leaving improved.

## 2014-04-16 NOTE — Telephone Encounter (Signed)
Pt states had 2015 mammo.  Need results.

## 2014-04-18 NOTE — Telephone Encounter (Signed)
Received a fax from [place] stating that patient has not had mammo this year. I contacted patient & she stated that her appt in this Friday.

## 2014-04-18 NOTE — Telephone Encounter (Signed)
Noted  

## 2014-04-18 NOTE — Telephone Encounter (Signed)
Faxed records request to [facility]

## 2014-04-21 ENCOUNTER — Ambulatory Visit: Payer: Self-pay | Admitting: Internal Medicine

## 2014-04-24 ENCOUNTER — Encounter: Payer: Self-pay | Admitting: Internal Medicine

## 2014-06-05 LAB — HEPATIC FUNCTION PANEL
ALT: 13 U/L (ref 7–35)
AST: 24 U/L (ref 13–35)
Alkaline Phosphatase: 81 U/L (ref 25–125)
Bilirubin, Total: 0.4 mg/dL

## 2014-06-05 LAB — CBC AND DIFFERENTIAL
HCT: 0 % — AB (ref 36–46)
Hemoglobin: 12.4 g/dL (ref 12.0–16.0)
Platelets: 177 10*3/uL (ref 150–399)
WBC: 7.5 10^3/mL

## 2014-06-06 ENCOUNTER — Encounter: Payer: Self-pay | Admitting: Internal Medicine

## 2014-06-15 DIAGNOSIS — G8929 Other chronic pain: Secondary | ICD-10-CM | POA: Insufficient documentation

## 2014-06-15 DIAGNOSIS — Z96651 Presence of right artificial knee joint: Secondary | ICD-10-CM | POA: Insufficient documentation

## 2014-06-15 DIAGNOSIS — L989 Disorder of the skin and subcutaneous tissue, unspecified: Secondary | ICD-10-CM | POA: Insufficient documentation

## 2014-06-15 DIAGNOSIS — M25561 Pain in right knee: Secondary | ICD-10-CM | POA: Insufficient documentation

## 2014-06-20 ENCOUNTER — Other Ambulatory Visit: Payer: Self-pay | Admitting: Internal Medicine

## 2014-06-21 ENCOUNTER — Ambulatory Visit: Payer: Self-pay | Admitting: Unknown Physician Specialty

## 2014-06-28 ENCOUNTER — Ambulatory Visit: Payer: Self-pay | Admitting: Unknown Physician Specialty

## 2014-06-28 LAB — SYNOVIAL CELL COUNT + DIFF, W/ CRYSTALS
Basophil: 0 %
Crystals, Joint Fluid: NONE SEEN
Eosinophil: 0 %
Lymphocytes: 43 %
Neutrophils: 57 %
Nucleated Cell Count: 320 /mm3
Other Cells BF: 0 %
Other Mononuclear Cells: 0 %

## 2014-06-29 ENCOUNTER — Ambulatory Visit (INDEPENDENT_AMBULATORY_CARE_PROVIDER_SITE_OTHER): Payer: 59 | Admitting: Internal Medicine

## 2014-06-29 ENCOUNTER — Encounter (INDEPENDENT_AMBULATORY_CARE_PROVIDER_SITE_OTHER): Payer: Self-pay

## 2014-06-29 ENCOUNTER — Encounter: Payer: Self-pay | Admitting: Internal Medicine

## 2014-06-29 ENCOUNTER — Telehealth: Payer: Self-pay

## 2014-06-29 VITALS — BP 110/70 | HR 63 | Temp 98.1°F | Ht 64.5 in | Wt 156.5 lb

## 2014-06-29 DIAGNOSIS — M25569 Pain in unspecified knee: Secondary | ICD-10-CM

## 2014-06-29 DIAGNOSIS — E7800 Pure hypercholesterolemia, unspecified: Secondary | ICD-10-CM

## 2014-06-29 DIAGNOSIS — M1990 Unspecified osteoarthritis, unspecified site: Secondary | ICD-10-CM

## 2014-06-29 DIAGNOSIS — K5090 Crohn's disease, unspecified, without complications: Secondary | ICD-10-CM

## 2014-06-29 DIAGNOSIS — I10 Essential (primary) hypertension: Secondary | ICD-10-CM

## 2014-06-29 NOTE — Progress Notes (Signed)
Subjective:    Patient ID: Ellen Drake, female    DOB: 07-Mar-1946, 68 y.o.   MRN: [id number]  HPI 68 year old female with past history of hypercholesterolemia, hypertension, Crohn's disease and fibrocystic breast disease who comes in today for a scheduled follow up.  Has had no abdominal pain or cramping.  Bowels stable.  Had a colonoscopy 4/14.  States everything checked out fine.  Just saw Dr Redmond Pulling.  Pentasa changes to sulfasalazine.  tolerating better now that she is on the coated form.  No cardiac symptoms with increased activity or exertion.  Breathing stable.  Eating and drinking well.  No increased cough or congestion.  She is s/p knee surgery.  Had second knee surgery 03/10/14 - arthroscopy.  Still with pain.  Seeing Dr Jefm Bryant.  Just evaluated.  S/p aspiration.  Just had bone density.  Has f/u planned 07/18/14.      Past Medical History  Diagnosis Date  . Hypertension   . Hypercholesterolemia   . Crohn's disease   . Fibrocystic breast disease   . Diverticulosis     Current Outpatient Prescriptions on File Prior to Visit  Medication Sig Dispense Refill  . albuterol (PROAIR HFA) 108 (90 BASE) MCG/ACT inhaler Inhale 2 puffs into the lungs every 6 (six) hours as needed for wheezing. 8.5 g 1  . aspirin 81 MG tablet Take 81 mg by mouth daily.    Marland Kitchen atenolol (TENORMIN) 50 MG tablet Take 1 tablet (50 mg total) by mouth daily. 30 tablet 5  . brimonidine (ALPHAGAN) 0.2 % ophthalmic solution 1 drop 3 (three) times daily.    . fluticasone (FLONASE) 50 MCG/ACT nasal spray Place 2 sprays into both nostrils daily. 16 g 2  . latanoprost (XALATAN) 0.005 % ophthalmic solution 1 drop at bedtime.    Marland Kitchen lisinopril (PRINIVIL,ZESTRIL) 40 MG tablet TAKE 1 TABLET (40 MG TOTAL) BY MOUTH DAILY. 90 tablet 1  . timolol (BETIMOL) 0.5 % ophthalmic solution 1 drop 2 (two) times daily.     No current facility-administered medications on file prior to visit.    Review of Systems Patient denies any  headache, lightheadedness or dizziness.  No sinus pressure.  No chest pain, tightness or palpitations. No increased shortness of breath, cough or congestion.    No nausea or vomiting.  No abdominal pain or cramping.  No bowel change, such as diarrhea, constipation - now.  No further bleeding.  No urine change.  Quit smoking a few years ago.  S/p right knee surgery x 2.  Due to f/u with Dr Jefm Bryant in December.  Just had knee aspirated.  Just had bone scan.  Persistent pain.  Dr Redmond Pulling changed her pentasa to sulfasalazine.          Objective:   Physical Exam  Filed Vitals:   06/29/14 0944  BP: 110/70  Pulse: 63  Temp: 98.1 F (36.7 C)   Blood pressure recheck:  43/37  68 year old female in no acute distress.   HEENT:  Nares- clear.  Oropharynx - without lesions. NECK:  Supple.  Nontender.  No audible bruit.  HEART:  Appears to be regular. LUNGS:  No crackles or wheezing audible.  Respirations even and unlabored.  RADIAL PULSE:  Equal bilaterally. ABDOMEN:  Soft, nontender.  Bowel sounds present and normal.  No audible abdominal bruit.    EXTREMITIES:  No increased edema present.  DP pulses palpable and equal bilaterally.         Assessment & Plan:  1. Essential hypertension Blood pressure as outlined.  Doing better.  Follow.  Same medications.   2. Crohn's disease, without complications Seeing Dr Redmond Pulling.  Just started sulfasalazine.  Doing well.  Up to date with colonoscopy.   3. Arthritis Persistent knee pain as outlined.  Follow.   4. Hypercholesterolemia Low cholesterol diet and exercise.    5. Knee pain, unspecified laterality Persistent.  Seeing Dr Jefm Bryant.  S/p two previous surgeries.  Just had knee aspiration.  Just had bone scan.  Has f/u planned 07/18/14.   6. PULMONARY.  Breathing stable.  Quit smoking a few years ago.  Follow.   HEALTH MAINTENANCE.  Physical 01/09/14.  Pap normal 08/12/12.  Last mammogram 04/21/14 - BiRADS I.  Colonoscopy 11/16/07.  Had follow up  colonoscopy 4/14.

## 2014-06-29 NOTE — Telephone Encounter (Signed)
Error

## 2014-06-29 NOTE — Progress Notes (Signed)
Pre visit review using our clinic review tool, if applicable. No additional management support is needed unless otherwise documented below in the visit note. 

## 2014-07-01 ENCOUNTER — Encounter: Payer: Self-pay | Admitting: Internal Medicine

## 2014-07-01 DIAGNOSIS — M25569 Pain in unspecified knee: Secondary | ICD-10-CM | POA: Insufficient documentation

## 2014-07-02 LAB — BODY FLUID CULTURE

## 2014-07-15 ENCOUNTER — Other Ambulatory Visit: Payer: Self-pay | Admitting: Internal Medicine

## 2014-07-17 ENCOUNTER — Other Ambulatory Visit: Payer: Self-pay | Admitting: Internal Medicine

## 2014-08-11 HISTORY — PX: KNEE SURGERY: SHX244

## 2014-08-25 ENCOUNTER — Telehealth: Payer: Self-pay | Admitting: *Deleted

## 2014-08-25 NOTE — Telephone Encounter (Signed)
Spoke with Otila Kluver.  She states she is faxing form to our fax number.  She further states pt has pre op on Monday and she will wait to see what else is needed after that before having pt schedule an appoint.

## 2014-08-25 NOTE — Telephone Encounter (Signed)
I have not received a pre op form on this pt.  There is no documentation that we have received any form.  Need to confirm number they are faxing the request to.  If they need medical clearance from me, she will need to be seen.  I am ok to send copy of ekg, labs - if they need copy to this.  Let me know if any problems.

## 2014-08-25 NOTE — Telephone Encounter (Signed)
Ellen Drake called requesting status of medical clearance form that was faxed on 12.18.15 and 1.15.16.  States pt has Pre Op on Monday 1.18.16.  Ellen Drake is requesting form and last OV notes, labs and EKG be faxed to [phone].  Please advise

## 2014-08-25 NOTE — Telephone Encounter (Signed)
Ok.  Please keep an eye out for the form and let me know if I need to do anything more.  Thanks.

## 2014-09-29 ENCOUNTER — Ambulatory Visit (INDEPENDENT_AMBULATORY_CARE_PROVIDER_SITE_OTHER): Payer: Medicare Other | Admitting: Internal Medicine

## 2014-09-29 ENCOUNTER — Encounter: Payer: Self-pay | Admitting: Internal Medicine

## 2014-09-29 VITALS — BP 120/80 | HR 54 | Temp 97.7°F | Ht 64.5 in | Wt 154.1 lb

## 2014-09-29 DIAGNOSIS — E7800 Pure hypercholesterolemia, unspecified: Secondary | ICD-10-CM

## 2014-09-29 DIAGNOSIS — I10 Essential (primary) hypertension: Secondary | ICD-10-CM

## 2014-09-29 DIAGNOSIS — R946 Abnormal results of thyroid function studies: Secondary | ICD-10-CM

## 2014-09-29 DIAGNOSIS — M25569 Pain in unspecified knee: Secondary | ICD-10-CM

## 2014-09-29 DIAGNOSIS — Z0000 Encounter for general adult medical examination without abnormal findings: Secondary | ICD-10-CM

## 2014-09-29 DIAGNOSIS — K5090 Crohn's disease, unspecified, without complications: Secondary | ICD-10-CM

## 2014-09-29 DIAGNOSIS — R7989 Other specified abnormal findings of blood chemistry: Secondary | ICD-10-CM

## 2014-09-29 LAB — BASIC METABOLIC PANEL
BUN: 16 mg/dL (ref 6–23)
CO2: 26 mEq/L (ref 19–32)
Calcium: 9.6 mg/dL (ref 8.4–10.5)
Chloride: 111 mEq/L (ref 96–112)
Creatinine, Ser: 0.82 mg/dL (ref 0.40–1.20)
GFR: 88.93 mL/min (ref >60.00)
Glucose, Bld: 90 mg/dL (ref 70–99)
Potassium: 4.1 mEq/L (ref 3.5–5.1)
Sodium: 143 mEq/L (ref 135–145)

## 2014-09-29 LAB — HEPATIC FUNCTION PANEL
ALT: 9 U/L (ref 0–35)
AST: 19 U/L (ref 0–37)
Albumin: 3.9 g/dL (ref 3.5–5.2)
Alkaline Phosphatase: 77 U/L (ref 39–117)
Bilirubin, Direct: 0.1 mg/dL (ref 0.0–0.3)
Total Bilirubin: 0.4 mg/dL (ref 0.2–1.2)
Total Protein: 6.9 g/dL (ref 6.0–8.3)

## 2014-09-29 LAB — LIPID PANEL
Cholesterol: 203 mg/dL — ABNORMAL HIGH (ref 0–200)
HDL: 45.1 mg/dL (ref >39.00)
LDL Cholesterol: 130 mg/dL — ABNORMAL HIGH (ref 0–99)
NonHDL: 157.9
Total CHOL/HDL Ratio: 5
Triglycerides: 142 mg/dL (ref 0.0–149.0)
VLDL: 28.4 mg/dL (ref 0.0–40.0)

## 2014-09-29 LAB — TSH: TSH: 1.53 u[IU]/mL (ref 0.35–4.50)

## 2014-09-29 NOTE — Progress Notes (Signed)
Pre visit review using our clinic review tool, if applicable. No additional management support is needed unless otherwise documented below in the visit note. 

## 2014-10-02 ENCOUNTER — Encounter: Payer: Self-pay | Admitting: Internal Medicine

## 2014-10-02 ENCOUNTER — Encounter: Payer: Self-pay | Admitting: *Deleted

## 2014-10-02 DIAGNOSIS — Z0000 Encounter for general adult medical examination without abnormal findings: Secondary | ICD-10-CM | POA: Insufficient documentation

## 2014-10-02 NOTE — Progress Notes (Signed)
Patient ID: Ellen Drake, female   DOB: 04-17-1946, 69 y.o.   MRN: [id number]   Subjective:    Patient ID: Ellen Drake, female    DOB: [birth year], 69 y.o.   MRN: [id number]  HPI  Patient here for a scheduled follow up.  Had knee surgery (right) - four weeks ago.  Going to therapy.  No cardiac symptoms with increased activity or exertion.  Breathing stable.  No acid reflux.  Bowels stable.  Blood pressure doing better.     Past Medical History  Diagnosis Date  . Hypertension   . Hypercholesterolemia   . Crohn's disease   . Fibrocystic breast disease   . Diverticulosis     Current Outpatient Prescriptions on File Prior to Visit  Medication Sig Dispense Refill  . albuterol (PROAIR HFA) 108 (90 BASE) MCG/ACT inhaler Inhale 2 puffs into the lungs every 6 (six) hours as needed for wheezing. 8.5 g 1  . aspirin 81 MG tablet Take 81 mg by mouth daily.    Marland Kitchen atenolol (TENORMIN) 50 MG tablet TAKE 1 TABLET (50 MG TOTAL) BY MOUTH DAILY. 30 tablet 5  . brimonidine (ALPHAGAN) 0.2 % ophthalmic solution 1 drop 3 (three) times daily.    . fluticasone (FLONASE) 50 MCG/ACT nasal spray Place 2 sprays into both nostrils daily. 16 g 2  . latanoprost (XALATAN) 0.005 % ophthalmic solution 1 drop at bedtime.    Marland Kitchen lisinopril (PRINIVIL,ZESTRIL) 40 MG tablet TAKE 1 TABLET (40 MG TOTAL) BY MOUTH DAILY. 90 tablet 1  . sulfaSALAzine (AZULFIDINE) 500 MG tablet Take 1,000 mg by mouth 3 (three) times daily.     No current facility-administered medications on file prior to visit.    Review of Systems  Constitutional: Negative for appetite change and unexpected weight change.  HENT: Negative for congestion and sinus pressure.   Respiratory: Negative for cough, chest tightness and shortness of breath.   Cardiovascular: Negative for chest pain, palpitations and leg swelling.  Gastrointestinal: Negative for nausea, vomiting, abdominal pain and diarrhea.  Genitourinary: Negative for dysuria and difficulty  urinating.  Neurological: Negative for dizziness, light-headedness and headaches.       Objective:    Physical Exam  Constitutional: She appears well-developed and well-nourished. No distress.  HENT:  Nose: Nose normal.  Mouth/Throat: Oropharynx is clear and moist.  Neck: Neck supple. No thyromegaly present.  Cardiovascular: Normal rate and regular rhythm.   Pulmonary/Chest: Breath sounds normal. No respiratory distress. She has no wheezes.  Abdominal: Soft. Bowel sounds are normal. There is no tenderness.  Musculoskeletal: She exhibits no edema or tenderness.  Lymphadenopathy:    She has no cervical adenopathy.  Skin: No rash noted. No erythema.    BP 120/80 mmHg  Pulse 54  Temp(Src) 97.7 F (36.5 C) (Oral)  Ht 5' 4.5" (1.638 m)  Wt 154 lb 2 oz (69.911 kg)  BMI 26.06 kg/m2  SpO2 98%  LMP 08/13/1995 Wt Readings from Last 3 Encounters:  09/29/14 154 lb 2 oz (69.911 kg)  06/29/14 156 lb 8 oz (70.988 kg)  04/11/14 153 lb 8 oz (69.627 kg)     Lab Results  Component Value Date   WBC 7.5 06/05/2014   HGB 12.4 06/05/2014   HCT 0* 06/05/2014   PLT 177 06/05/2014   GLUCOSE 90 09/29/2014   CHOL 203* 09/29/2014   TRIG 142.0 09/29/2014   HDL 45.10 09/29/2014   LDLDIRECT 132.5 05/06/2013   LDLCALC 130* 09/29/2014   ALT 9 09/29/2014   AST  19 09/29/2014   NA 143 09/29/2014   K 4.1 09/29/2014   CL 111 09/29/2014   CREATININE 0.82 09/29/2014   BUN 16 09/29/2014   CO2 26 09/29/2014   TSH 1.53 09/29/2014   HGBA1C 5.7 10/19/2012    Dg Chest 2 View  12/26/2013   CLINICAL DATA:  Cough.  EXAM: CHEST  2 VIEW  COMPARISON:  None.  FINDINGS: Mediastinum and hilar structures normal. The lungs are clear. Heart size upper limit normal. Surgical clips left upper quadrant prior cervical spine fusion.  IMPRESSION: No active cardiopulmonary disease.   Electronically Signed   By: Marcello Moores  Register   On: 12/26/2013 16:42       Assessment & Plan:   Problem List Items Addressed This  Visit    Crohn's disease    Had f/u colonoscopy 11/2012 - ok.  Recommend f/u colonoscopy in 5 years.  Currently asymptomatic.  Continue to f/u with Dr Redmond Pulling.        Relevant Orders   Hepatic function panel (Completed)   Health care maintenance    Physical 01/09/14.  PAP 08/22/12.  Mammogram 04/21/14 - Birads I.  11/2012 - colonoscopy.        Hypercholesterolemia    Low cholesterol diet and exercise.  Follow lipid panel.        Relevant Orders   Lipid panel (Completed)   Hypertension - Primary    Blood pressure doing well.  Same medication regimen.  Follow pressures.  Follow met b.        Relevant Orders   Basic metabolic panel (Completed)   Knee pain    Persistent knee pain s/p surgery.  With increased swelling.  Wanted to pursue lower extremity ultrasound.  She declined.  We discussed possible clot, etc.  She continues to decline.  Has f/u planned with ortho 10/02/14.  Going to physical therapy.         Other Visit Diagnoses    Low TSH level        Relevant Orders    TSH (Completed)        Einar Pheasant, MD

## 2014-10-02 NOTE — Assessment & Plan Note (Signed)
Blood pressure doing well.  Same medication regimen.  Follow pressures.  Follow met b.   

## 2014-10-02 NOTE — Assessment & Plan Note (Signed)
Physical 01/09/14.  PAP 08/22/12.  Mammogram 04/21/14 - Birads I.  11/2012 - colonoscopy.

## 2014-10-02 NOTE — Assessment & Plan Note (Signed)
Low cholesterol diet and exercise.  Follow lipid panel.

## 2014-10-02 NOTE — Assessment & Plan Note (Signed)
Persistent knee pain s/p surgery.  With increased swelling.  Wanted to pursue lower extremity ultrasound.  She declined.  We discussed possible clot, etc.  She continues to decline.  Has f/u planned with ortho 10/02/14.  Going to physical therapy.

## 2014-10-02 NOTE — Assessment & Plan Note (Signed)
Had f/u colonoscopy 11/2012 - ok.  Recommend f/u colonoscopy in 5 years.  Currently asymptomatic.  Continue to f/u with Dr Redmond Pulling.

## 2014-12-22 ENCOUNTER — Other Ambulatory Visit: Payer: Self-pay | Admitting: Internal Medicine

## 2014-12-25 ENCOUNTER — Other Ambulatory Visit: Payer: Self-pay | Admitting: Internal Medicine

## 2015-01-08 ENCOUNTER — Other Ambulatory Visit: Payer: Self-pay | Admitting: Internal Medicine

## 2015-01-09 ENCOUNTER — Other Ambulatory Visit: Payer: Self-pay | Admitting: Internal Medicine

## 2015-01-17 ENCOUNTER — Ambulatory Visit (INDEPENDENT_AMBULATORY_CARE_PROVIDER_SITE_OTHER): Payer: Medicare Other | Admitting: Nurse Practitioner

## 2015-01-17 ENCOUNTER — Encounter: Payer: Self-pay | Admitting: Nurse Practitioner

## 2015-01-17 VITALS — BP 136/78 | HR 84 | Temp 97.4°F | Resp 14 | Ht 64.5 in | Wt 152.4 lb

## 2015-01-17 DIAGNOSIS — R21 Rash and other nonspecific skin eruption: Secondary | ICD-10-CM

## 2015-01-17 MED ORDER — TRIAMCINOLONE ACETONIDE 0.5 % EX OINT: 1.0000 "application " | TOPICAL_OINTMENT | Freq: Two times a day (BID) | CUTANEOUS | Status: DC

## 2015-01-17 NOTE — Progress Notes (Signed)
   Subjective:    Patient ID: Ellen Drake, female    DOB: 11-24-1945, 68 y.o.   MRN: [id number]  HPI  Ellen Drake is a 69 yo female with a CC of itchy rash x 1 year.   1) Itchy rash on arms, knees, legs, x 1 year ago  Appointment with dermatology.  Itching all over  Benadryl  Seeing ortho soon due to belief it is from the implant per pt.   Review of Systems  Constitutional: Negative for fever, chills, diaphoresis and fatigue.  Respiratory: Negative for chest tightness, shortness of breath and wheezing.   Cardiovascular: Negative for chest pain, palpitations and leg swelling.  Gastrointestinal: Negative for nausea, vomiting, diarrhea and rectal pain.  Musculoskeletal: Positive for arthralgias.  Skin: Positive for rash.  Neurological: Negative for dizziness, weakness, numbness and headaches.  Psychiatric/Behavioral: The patient is not nervous/anxious.       Objective:   Physical Exam  Constitutional: She is oriented to person, place, and time. She appears well-developed and well-nourished. No distress.  HENT:  Head: Normocephalic and atraumatic.  Right Ear: External ear normal.  Left Ear: External ear normal.  Cardiovascular: Normal rate, regular rhythm, normal heart sounds and intact distal pulses.  Exam reveals no gallop and no friction rub.   No murmur heard. Pulmonary/Chest: Effort normal and breath sounds normal. No respiratory distress. She has no wheezes. She has no rales. She exhibits no tenderness.  Neurological: She is alert and oriented to person, place, and time. No cranial nerve deficit. She exhibits normal muscle tone. Coordination normal.  Skin: Skin is warm and dry. Rash noted. She is not diaphoretic.  Erythma in no particular distribution, not raised  Psychiatric: She has a normal mood and affect. Her behavior is normal. Judgment and thought content normal.        Assessment & Plan:  Rash  1) Kenalog cream twice daily on areas that are pruritic 2)  Benadryl at night to help with symptoms 3) FU prn worsening/failure to improve.

## 2015-01-17 NOTE — Patient Instructions (Signed)
Try the Kenalog cream twice daily on your itchy areas.   Benadryl at night time to help with sleep and symptoms.   Call us if not helpful Monday.

## 2015-01-17 NOTE — Progress Notes (Signed)
Pre visit review using our clinic review tool, if applicable. No additional management support is needed unless otherwise documented below in the visit note. 

## 2015-01-22 ENCOUNTER — Telehealth: Payer: Self-pay | Admitting: Internal Medicine

## 2015-01-22 ENCOUNTER — Other Ambulatory Visit: Payer: Self-pay | Admitting: *Deleted

## 2015-01-22 MED ORDER — TRIAMCINOLONE ACETONIDE 0.5 % EX OINT: 1.0000 "application " | TOPICAL_OINTMENT | Freq: Two times a day (BID) | CUTANEOUS | Status: DC

## 2015-01-22 NOTE — Telephone Encounter (Signed)
Okay to refill? 

## 2015-01-22 NOTE — Telephone Encounter (Signed)
The patient is needing a refill on her triamcinolone ointment (KENALOG) 0.5 %. She stated the cream is working.

## 2015-01-25 NOTE — Telephone Encounter (Signed)
I am closing this encounter since it is from 12.12.13. I am sure it has been taking care of since it is now 2016.I am closing this encounter it is from

## 2015-02-06 ENCOUNTER — Encounter: Payer: Self-pay | Admitting: Internal Medicine

## 2015-02-06 ENCOUNTER — Ambulatory Visit (INDEPENDENT_AMBULATORY_CARE_PROVIDER_SITE_OTHER): Payer: Medicare Other | Admitting: Internal Medicine

## 2015-02-06 VITALS — BP 122/80 | HR 62 | Temp 97.7°F | Ht 64.5 in | Wt 149.5 lb

## 2015-02-06 DIAGNOSIS — I10 Essential (primary) hypertension: Secondary | ICD-10-CM | POA: Diagnosis not present

## 2015-02-06 DIAGNOSIS — E7800 Pure hypercholesterolemia, unspecified: Secondary | ICD-10-CM

## 2015-02-06 DIAGNOSIS — K5090 Crohn's disease, unspecified, without complications: Secondary | ICD-10-CM

## 2015-02-06 DIAGNOSIS — Z1239 Encounter for other screening for malignant neoplasm of breast: Secondary | ICD-10-CM

## 2015-02-06 DIAGNOSIS — Z0000 Encounter for general adult medical examination without abnormal findings: Secondary | ICD-10-CM

## 2015-02-06 DIAGNOSIS — M1990 Unspecified osteoarthritis, unspecified site: Secondary | ICD-10-CM

## 2015-02-06 DIAGNOSIS — M25569 Pain in unspecified knee: Secondary | ICD-10-CM

## 2015-02-06 NOTE — Progress Notes (Signed)
Patient ID: Ellen Drake, female   DOB: 1946/04/07, 69 y.o.   MRN: [id number]   Subjective:    Patient ID: Ellen Drake, female    DOB: Oct 21, 1945, 69 y.o.   MRN: [id number]  HPI  Patient here to follow up on her current medical issues as well as for a complete physical exam.  Right knee is still bothering her.  Seeing ortho.  Has f/u planned with her surgeon in 02/2015.  Takes pain medication only at night.  Also has had problems with persistent rash and itching.  Involves her arms, back, legs.  No hand or foot involvement.  Bowels doing well.  No abdominal pain or cramping.     Past Medical History  Diagnosis Date  . Hypertension   . Hypercholesterolemia   . Crohn's disease   . Fibrocystic breast disease   . Diverticulosis     Current Outpatient Prescriptions on File Prior to Visit  Medication Sig Dispense Refill  . aspirin 81 MG tablet Take 81 mg by mouth daily.    Marland Kitchen atenolol (TENORMIN) 50 MG tablet TAKE 1 TABLET (50 MG TOTAL) BY MOUTH DAILY. 30 tablet 5  . brimonidine (ALPHAGAN) 0.2 % ophthalmic solution 1 drop 3 (three) times daily.    Marland Kitchen HYDROcodone-acetaminophen (NORCO/VICODIN) 5-325 MG per tablet Take 1 tablet by mouth every 6 (six) hours as needed. for pain  0  . latanoprost (XALATAN) 0.005 % ophthalmic solution 1 drop at bedtime.    Marland Kitchen lisinopril (PRINIVIL,ZESTRIL) 40 MG tablet TAKE 1 TABLET (40 MG TOTAL) BY MOUTH DAILY. 90 tablet 1  . sulfaSALAzine (AZULFIDINE) 500 MG tablet Take 1,000 mg by mouth 3 (three) times daily.    . timolol (TIMOPTIC) 0.5 % ophthalmic solution 1 drop 2 (two) times daily.   5  . triamcinolone ointment (KENALOG) 0.5 % Apply 1 application topically 2 (two) times daily. 15 g 0   No current facility-administered medications on file prior to visit.    Review of Systems  Constitutional: Negative for appetite change and unexpected weight change.  HENT: Negative for congestion and sinus pressure.   Eyes: Negative for pain and visual disturbance.    Respiratory: Negative for cough, chest tightness and shortness of breath.   Cardiovascular: Negative for chest pain, palpitations and leg swelling.  Gastrointestinal: Negative for nausea, vomiting, abdominal pain and diarrhea.  Genitourinary: Negative for dysuria and difficulty urinating.  Musculoskeletal: Negative for back pain.       Increased pain - knee.  Persistent.  S/p two surgeries, aspiration and cortisone injection - only temporary relief.    Skin:       Persistent rash and itching as outlined.  Has tried benadryl.  Helped some.  Not taking regularly.  Using cortisone cream.    Neurological: Negative for dizziness, light-headedness and headaches.  Hematological: Negative for adenopathy. Does not bruise/bleed easily.  Psychiatric/Behavioral: Negative for dysphoric mood and agitation.       Objective:    Physical Exam  Constitutional: She is oriented to person, place, and time. She appears well-developed and well-nourished.  HENT:  Nose: Nose normal.  Mouth/Throat: Oropharynx is clear and moist.  Eyes: Right eye exhibits no discharge. Left eye exhibits no discharge. No scleral icterus.  Neck: Neck supple. No thyromegaly present.  Cardiovascular: Normal rate and regular rhythm.   Pulmonary/Chest: Breath sounds normal. No accessory muscle usage. No tachypnea. No respiratory distress. She has no decreased breath sounds. She has no wheezes. She has no rhonchi. Right breast exhibits  no inverted nipple, no mass, no nipple discharge and no tenderness (no axillary adenopathy). Left breast exhibits no inverted nipple, no mass, no nipple discharge and no tenderness (no axilarry adenopathy).  Abdominal: Soft. Bowel sounds are normal. There is no tenderness.  Musculoskeletal: She exhibits no edema or tenderness.  Lymphadenopathy:    She has no cervical adenopathy.  Neurological: She is alert and oriented to person, place, and time.  Skin: Skin is warm. No rash noted.  Psychiatric: She  has a normal mood and affect. Her behavior is normal.    BP 122/80 mmHg  Pulse 62  Temp(Src) 97.7 F (36.5 C) (Oral)  Ht 5' 4.5" (1.638 m)  Wt 149 lb 8 oz (67.813 kg)  BMI 25.27 kg/m2  SpO2 97%  LMP 08/13/1995 Wt Readings from Last 3 Encounters:  02/06/15 149 lb 8 oz (67.813 kg)  01/17/15 152 lb 6.4 oz (69.128 kg)  09/29/14 154 lb 2 oz (69.911 kg)     Lab Results  Component Value Date   WBC 7.5 06/05/2014   HGB 12.4 06/05/2014   HCT 0* 06/05/2014   PLT 177 06/05/2014   GLUCOSE 90 09/29/2014   CHOL 203* 09/29/2014   TRIG 142.0 09/29/2014   HDL 45.10 09/29/2014   LDLDIRECT 132.5 05/06/2013   LDLCALC 130* 09/29/2014   ALT 9 09/29/2014   AST 19 09/29/2014   NA 143 09/29/2014   K 4.1 09/29/2014   CL 111 09/29/2014   CREATININE 0.82 09/29/2014   BUN 16 09/29/2014   CO2 26 09/29/2014   TSH 1.53 09/29/2014   HGBA1C 5.7 10/19/2012       Assessment & Plan:   Problem List Items Addressed This Visit    Arthritis    Has had two knee surgeries.  Due to f/u Dr Jefm Bryant - 02/26/15.        Relevant Orders   Lipid panel   Crohn's disease    Had f/u colonoscopy 11/2012 - ok.  Recommended f/u colonoscopy in 5 years.  Followed by Dr Redmond Pulling.       Health care maintenance    Physical today 02/06/15.  PAP 08/22/12.  Colonoscopy 11/2012.  Recommended f/u colonoscopy in five years.  Mammogram 04/21/14 - Birads I.        Hypercholesterolemia    Low cholesterol diet and exercise.  Follow lipid panel.   Lab Results  Component Value Date   CHOL 203* 09/29/2014   HDL 45.10 09/29/2014   LDLCALC 130* 09/29/2014   LDLDIRECT 132.5 05/06/2013   TRIG 142.0 09/29/2014   CHOLHDL 5 09/29/2014        Relevant Orders   Hepatic function panel   Hypertension    Blood pressure as outlined.  Overall doing better.  Same medication regimen.  Follow pressures.  Follow metabolic panel.        Relevant Orders   Basic metabolic panel   Knee pain    Persistent knee pain.  S/p two surgeries.   Due to f/u with Dr Jefm Bryant 02/2017.         Other Visit Diagnoses    Screening breast examination    -  Primary    Relevant Orders    MM DIGITAL SCREENING BILATERAL      I spent 25 minutes with the patient and more than 50% of the time was spent in consultation regarding the above.     Einar Pheasant, MD

## 2015-02-06 NOTE — Progress Notes (Signed)
Pre visit review using our clinic review tool, if applicable. No additional management support is needed unless otherwise documented below in the visit note. 

## 2015-02-07 ENCOUNTER — Encounter: Payer: Self-pay | Admitting: Internal Medicine

## 2015-02-08 NOTE — Assessment & Plan Note (Signed)
Had f/u colonoscopy 11/2012 - ok.  Recommended f/u colonoscopy in 5 years.  Followed by Dr Redmond Pulling.

## 2015-02-08 NOTE — Assessment & Plan Note (Signed)
Physical today 02/06/15.  PAP 08/22/12.  Colonoscopy 11/2012.  Recommended f/u colonoscopy in five years.  Mammogram 04/21/14 - Birads I.

## 2015-02-08 NOTE — Assessment & Plan Note (Signed)
Has had two knee surgeries.  Due to f/u Dr Jefm Bryant - 02/26/15.

## 2015-02-08 NOTE — Assessment & Plan Note (Signed)
Persistent knee pain.  S/p two surgeries.  Due to f/u with Dr Jefm Bryant 02/2017.

## 2015-02-08 NOTE — Assessment & Plan Note (Signed)
Low cholesterol diet and exercise.  Follow lipid panel.   Lab Results  Component Value Date   CHOL 203* 09/29/2014   HDL 45.10 09/29/2014   LDLCALC 130* 09/29/2014   LDLDIRECT 132.5 05/06/2013   TRIG 142.0 09/29/2014   CHOLHDL 5 09/29/2014

## 2015-02-08 NOTE — Assessment & Plan Note (Signed)
Blood pressure as outlined.  Overall doing better.  Same medication regimen.  Follow pressures.  Follow metabolic panel.

## 2015-02-13 ENCOUNTER — Telehealth: Payer: Self-pay

## 2015-02-13 DIAGNOSIS — R21 Rash and other nonspecific skin eruption: Secondary | ICD-10-CM

## 2015-02-13 DIAGNOSIS — L299 Pruritus, unspecified: Secondary | ICD-10-CM

## 2015-02-13 NOTE — Telephone Encounter (Signed)
The patient called and is wanting to report that the zantac rx she was given is not working.

## 2015-02-13 NOTE — Telephone Encounter (Signed)
What specific symptoms is she having now?

## 2015-02-13 NOTE — Telephone Encounter (Signed)
FYI

## 2015-02-14 NOTE — Telephone Encounter (Signed)
Given persistent rash and itching despite antihistamine and steroid creams, needs dermatology evaluation.  If agreeable, I will place order for dermatology referral.

## 2015-02-14 NOTE — Telephone Encounter (Signed)
Pt states that the pharmacist told her to take Zyrtec & she is also taking Hydrocortisone cream. Still states that she is broken out real bad on arms, stomach, back, shoulder, & legs. Please advise. She also said that the cream that Morey Hummingbird gave her helped more. Please advise

## 2015-02-15 NOTE — Telephone Encounter (Signed)
Order placed for referral to dermatology.

## 2015-02-15 NOTE — Telephone Encounter (Signed)
Spoke to pt, agreeable to referral. Advised [facility] would call with appt. Prefers referral to [facility] [place] [facility], has been seen there previously.

## 2015-02-20 ENCOUNTER — Other Ambulatory Visit: Payer: Medicare Other

## 2015-02-22 ENCOUNTER — Other Ambulatory Visit (INDEPENDENT_AMBULATORY_CARE_PROVIDER_SITE_OTHER): Payer: Medicare Other

## 2015-02-22 DIAGNOSIS — E7800 Pure hypercholesterolemia, unspecified: Secondary | ICD-10-CM

## 2015-02-22 DIAGNOSIS — I10 Essential (primary) hypertension: Secondary | ICD-10-CM

## 2015-02-22 DIAGNOSIS — M1990 Unspecified osteoarthritis, unspecified site: Secondary | ICD-10-CM

## 2015-02-22 LAB — HEPATIC FUNCTION PANEL
ALT: 18 U/L (ref 0–35)
AST: 27 U/L (ref 0–37)
Albumin: 4.2 g/dL (ref 3.5–5.2)
Alkaline Phosphatase: 69 U/L (ref 39–117)
Bilirubin, Direct: 0.1 mg/dL (ref 0.0–0.3)
Total Bilirubin: 0.4 mg/dL (ref 0.2–1.2)
Total Protein: 7 g/dL (ref 6.0–8.3)

## 2015-02-22 LAB — BASIC METABOLIC PANEL
BUN: 11 mg/dL (ref 6–23)
CO2: 30 mEq/L (ref 19–32)
Calcium: 9.5 mg/dL (ref 8.4–10.5)
Chloride: 107 mEq/L (ref 96–112)
Creatinine, Ser: 0.76 mg/dL (ref 0.40–1.20)
GFR: 96.97 mL/min (ref >60.00)
Glucose, Bld: 90 mg/dL (ref 70–99)
Potassium: 3.8 mEq/L (ref 3.5–5.1)
Sodium: 142 mEq/L (ref 135–145)

## 2015-02-22 LAB — LIPID PANEL
Cholesterol: 220 mg/dL — ABNORMAL HIGH (ref 0–200)
HDL: 62.2 mg/dL (ref >39.00)
LDL Cholesterol: 136 mg/dL — ABNORMAL HIGH (ref 0–99)
NonHDL: 157.8
Total CHOL/HDL Ratio: 4
Triglycerides: 110 mg/dL (ref 0.0–149.0)
VLDL: 22 mg/dL (ref 0.0–40.0)

## 2015-02-23 ENCOUNTER — Encounter: Payer: Self-pay | Admitting: *Deleted

## 2015-04-24 ENCOUNTER — Ambulatory Visit
Admission: RE | Admit: 2015-04-24 | Discharge: 2015-04-24 | Disposition: A | Discharge status: Home / Self Care | Payer: Medicare Other | Source: Ambulatory Visit | Attending: Internal Medicine | Admitting: Internal Medicine

## 2015-04-24 DIAGNOSIS — Z1231 Encounter for screening mammogram for malignant neoplasm of breast: Secondary | ICD-10-CM | POA: Diagnosis present

## 2015-04-24 DIAGNOSIS — Z1239 Encounter for other screening for malignant neoplasm of breast: Secondary | ICD-10-CM

## 2015-04-30 ENCOUNTER — Telehealth: Payer: Self-pay | Admitting: Internal Medicine

## 2015-04-30 NOTE — Telephone Encounter (Signed)
Labs faxed

## 2015-04-30 NOTE — Telephone Encounter (Signed)
Pt called to give fax number for lab work to faxed to [facility] fax number [phone]. Thank You!

## 2015-06-08 ENCOUNTER — Ambulatory Visit (INDEPENDENT_AMBULATORY_CARE_PROVIDER_SITE_OTHER): Payer: Medicare Other | Admitting: Internal Medicine

## 2015-06-08 ENCOUNTER — Encounter: Payer: Self-pay | Admitting: Internal Medicine

## 2015-06-08 VITALS — BP 120/80 | HR 55 | Temp 97.9°F | Resp 18 | Ht 64.5 in | Wt 152.0 lb

## 2015-06-08 DIAGNOSIS — I10 Essential (primary) hypertension: Secondary | ICD-10-CM

## 2015-06-08 DIAGNOSIS — E7800 Pure hypercholesterolemia, unspecified: Secondary | ICD-10-CM

## 2015-06-08 DIAGNOSIS — M25569 Pain in unspecified knee: Secondary | ICD-10-CM | POA: Diagnosis not present

## 2015-06-08 DIAGNOSIS — K5010 Crohn's disease of large intestine without complications: Secondary | ICD-10-CM | POA: Diagnosis not present

## 2015-06-08 DIAGNOSIS — Z23 Encounter for immunization: Secondary | ICD-10-CM

## 2015-06-08 MED ORDER — ATENOLOL 50 MG PO TABS: ORAL_TABLET | ORAL | Status: DC

## 2015-06-08 MED ORDER — LISINOPRIL 40 MG PO TABS: ORAL_TABLET | ORAL | Status: DC

## 2015-06-08 NOTE — Progress Notes (Signed)
Patient ID: Ellen Drake, female   DOB: [birth year], 69 y.o.   MRN: [id number]   Subjective:    Patient ID: Ellen Drake, female    DOB: 09-20-1945, 69 y.o.   MRN: [id number]  HPI  Patient with past history of hypercholesterolemia, hypertension, crohn's disease and recent problems with her knee.  She comes in today to follow up on these issues. She is still having problems with her knee.  Seeing Dr Billie Lade.  He gave her voltaren gel.  Did not help.  Takes hydrocodone q day prn.  Tries to limit taking.  Had a bone scan and has f/u planned next week to discuss.  She just saw Dr Redmond Pulling.  Sulfasalazine was decreased to bid dosing.  Bowels doing well.  No abdominal pain or cramping.  Eating and drinking well.  No nausea or vomiting. Breathing doing well.  No cough or congestion. No acid reflux.      Past Medical History  Diagnosis Date  . Hypertension   . Hypercholesterolemia   . Crohn's disease ([place])   . Fibrocystic breast disease   . Diverticulosis    Past Surgical History  Procedure Laterality Date  . Back surgery  1992    ruptured disc (Dr Mauri Pole)  . Anterior cervical discectomy and fusion  1998    Dr Rolin Barry  . Knee surgery Right 1/16    [facility] (Dr. Sherlynn Carbon)  . Breast biopsy Left     neg-no scar seen   Family History  Problem Relation Age of Onset  . CVA Mother   . Hypertension Mother   . Hypertension Sister   . Breast cancer Neg Hx   . Colon cancer Neg Hx    Social History   Social History  . Marital Status: Married    Spouse Name: N/A  . Number of Children: 0  . Years of Education: N/A   Social History Main Topics  . Smoking status: Former Smoker    Quit date: 08/11/2008  . Smokeless tobacco: Never Used  . Alcohol Use: 0.0 oz/week    0 Standard drinks or equivalent per week     Comment: wine occasional  . Drug Use: No  . Sexual Activity: Not Asked   Other Topics Concern  . None   Social History Narrative    Outpatient Encounter Prescriptions  as of 06/08/2015  Medication Sig  . aspirin 81 MG tablet Take 81 mg by mouth daily.  Marland Kitchen atenolol (TENORMIN) 50 MG tablet TAKE 1 TABLET (50 MG TOTAL) BY MOUTH DAILY.  . brimonidine (ALPHAGAN) 0.2 % ophthalmic solution 1 drop 3 (three) times daily.  Marland Kitchen HYDROcodone-acetaminophen (NORCO/VICODIN) 5-325 MG per tablet Take 1 tablet by mouth every 6 (six) hours as needed. for pain  . latanoprost (XALATAN) 0.005 % ophthalmic solution 1 drop at bedtime.  Marland Kitchen lisinopril (PRINIVIL,ZESTRIL) 40 MG tablet TAKE 1 TABLET (40 MG TOTAL) BY MOUTH DAILY.  Marland Kitchen sulfaSALAzine (AZULFIDINE) 500 MG tablet Take 1,000 mg by mouth 3 (three) times daily.  . timolol (TIMOPTIC) 0.5 % ophthalmic solution 1 drop 2 (two) times daily.   Marland Kitchen triamcinolone ointment (KENALOG) 0.5 % Apply 1 application topically 2 (two) times daily.  . [DISCONTINUED] atenolol (TENORMIN) 50 MG tablet TAKE 1 TABLET (50 MG TOTAL) BY MOUTH DAILY.  . [DISCONTINUED] lisinopril (PRINIVIL,ZESTRIL) 40 MG tablet TAKE 1 TABLET (40 MG TOTAL) BY MOUTH DAILY.   No facility-administered encounter medications on file as of 06/08/2015.    Review of Systems  Constitutional: Negative for  appetite change and unexpected weight change.  HENT: Negative for congestion and sinus pressure.   Eyes: Negative for discharge and visual disturbance.  Respiratory: Negative for cough, chest tightness and shortness of breath.   Cardiovascular: Negative for chest pain, palpitations and leg swelling.  Gastrointestinal: Negative for nausea, vomiting, abdominal pain and diarrhea.  Genitourinary: Negative for dysuria and difficulty urinating.  Musculoskeletal: Negative for back pain.       Knee pain as outlined.  Persistent.  Has f/u planned with ortho next week.   Skin: Negative for color change and rash.  Neurological: Negative for dizziness, light-headedness and headaches.  Psychiatric/Behavioral: Negative for dysphoric mood and agitation.       Objective:    Physical Exam    Constitutional: She appears well-developed and well-nourished. No distress.  HENT:  Nose: Nose normal.  Mouth/Throat: Oropharynx is clear and moist.  Eyes: Conjunctivae are normal. Right eye exhibits no discharge. Left eye exhibits no discharge.  Neck: Neck supple. No thyromegaly present.  Cardiovascular: Normal rate and regular rhythm.   Pulmonary/Chest: Breath sounds normal. No respiratory distress. She has no wheezes.  Abdominal: Soft. Bowel sounds are normal. There is no tenderness.  Musculoskeletal: She exhibits no edema or tenderness.  Lymphadenopathy:    She has no cervical adenopathy.  Skin: No rash noted. No erythema.  Psychiatric: She has a normal mood and affect. Her behavior is normal.    BP 120/80 mmHg  Pulse 55  Temp(Src) 97.9 F (36.6 C) (Oral)  Resp 18  Ht 5' 4.5" (1.638 m)  Wt 152 lb (68.947 kg)  BMI 25.70 kg/m2  SpO2 97%  LMP 08/13/1995 Wt Readings from Last 3 Encounters:  06/08/15 152 lb (68.947 kg)  02/06/15 149 lb 8 oz (67.813 kg)  01/17/15 152 lb 6.4 oz (69.128 kg)     Lab Results  Component Value Date   WBC 7.5 06/05/2014   HGB 12.4 06/05/2014   HCT 0* 06/05/2014   PLT 177 06/05/2014   GLUCOSE 90 02/22/2015   CHOL 220* 02/22/2015   TRIG 110.0 02/22/2015   HDL 62.20 02/22/2015   LDLDIRECT 132.5 05/06/2013   LDLCALC 136* 02/22/2015   ALT 18 02/22/2015   AST 27 02/22/2015   NA 142 02/22/2015   K 3.8 02/22/2015   CL 107 02/22/2015   CREATININE 0.76 02/22/2015   BUN 11 02/22/2015   CO2 30 02/22/2015   TSH 1.53 09/29/2014   HGBA1C 5.7 10/19/2012    Mm Digital Screening Bilateral  04/24/2015  CLINICAL DATA:  Screening. EXAM: DIGITAL SCREENING BILATERAL MAMMOGRAM WITH CAD COMPARISON:  Previous exam(s). ACR Breast Density Category c: The breast tissue is heterogeneously dense, which may obscure small masses. FINDINGS: There are no findings suspicious for malignancy. Images were processed with CAD. IMPRESSION: No mammographic evidence of  malignancy. A result letter of this screening mammogram will be mailed directly to the patient. RECOMMENDATION: Screening mammogram in one year. (Code:SM-B-01Y) BI-RADS CATEGORY  1: Negative. Electronically Signed   By: Everlean Alstrom M.D.   On: 04/24/2015 10:53       Assessment & Plan:   Problem List Items Addressed This Visit    Crohn's disease ([place])    Sees Dr Redmond Pulling.  Recently decreased sulfasalazine to bid.  No abdominal pain.  Bowels doing well.  Colonoscopy 11/2012.  Recommended f/u colonoscopy in 2017.        Hypercholesterolemia    Low cholesterol diet and exercise.  Follow lipid panel.  Relevant Medications   atenolol (TENORMIN) 50 MG tablet   lisinopril (PRINIVIL,ZESTRIL) 40 MG tablet   Other Relevant Orders   Hepatic function panel   Lipid panel   Hypertension    Blood pressure has been under good control.  Continue same medication regimen.  Follow pressures.  Follow metabolic panel.        Relevant Medications   atenolol (TENORMIN) 50 MG tablet   lisinopril (PRINIVIL,ZESTRIL) 40 MG tablet   Other Relevant Orders   Basic metabolic panel   Knee pain    Knee pain as outlined.  Followed by ortho.  Had bone scan.  Planning to f/u next week.         Other Visit Diagnoses    Encounter for immunization    -  Primary        Einar Pheasant, MD

## 2015-06-08 NOTE — Patient Instructions (Signed)
Influenza Virus Vaccine injection (Fluarix) What is this medicine? INFLUENZA VIRUS VACCINE (in floo EN zuh VAHY ruhs vak SEEN) helps to reduce the risk of getting influenza also known as the flu. This medicine may be used for other purposes; ask your health care provider or pharmacist if you have questions. What should I tell my health care provider before I take this medicine? They need to know if you have any of these conditions: -bleeding disorder like hemophilia -fever or infection -Guillain-Barre syndrome or other neurological problems -immune system problems -infection with the human immunodeficiency virus (HIV) or AIDS -low blood platelet counts -multiple sclerosis -an unusual or allergic reaction to influenza virus vaccine, eggs, chicken proteins, latex, gentamicin, other medicines, foods, dyes or preservatives -pregnant or trying to get pregnant -breast-feeding How should I use this medicine? This vaccine is for injection into a muscle. It is given by a health care professional. A copy of Vaccine Information Statements will be given before each vaccination. Read this sheet carefully each time. The sheet may change frequently. Talk to your pediatrician regarding the use of this medicine in children. Special care may be needed. Overdosage: If you think you have taken too much of this medicine contact a poison control center or emergency room at once. NOTE: This medicine is only for you. Do not share this medicine with others. What if I miss a dose? This does not apply. What may interact with this medicine? -chemotherapy or radiation therapy -medicines that lower your immune system like etanercept, anakinra, infliximab, and adalimumab -medicines that treat or prevent blood clots like warfarin -phenytoin -steroid medicines like prednisone or cortisone -theophylline -vaccines This list may not describe all possible interactions. Give your health care provider a list of all the  medicines, herbs, non-prescription drugs, or dietary supplements you use. Also tell them if you smoke, drink alcohol, or use illegal drugs. Some items may interact with your medicine. What should I watch for while using this medicine? Report any side effects that do not go away within 3 days to your doctor or health care professional. Call your health care provider if any unusual symptoms occur within 6 weeks of receiving this vaccine. You may still catch the flu, but the illness is not usually as bad. You cannot get the flu from the vaccine. The vaccine will not protect against colds or other illnesses that may cause fever. The vaccine is needed every year. What side effects may I notice from receiving this medicine? Side effects that you should report to your doctor or health care professional as soon as possible: -allergic reactions like skin rash, itching or hives, swelling of the face, lips, or tongue Side effects that usually do not require medical attention (report to your doctor or health care professional if they continue or are bothersome): -fever -headache -muscle aches and pains -pain, tenderness, redness, or swelling at site where injected -weak or tired This list may not describe all possible side effects. Call your doctor for medical advice about side effects. You may report side effects to FDA at 1-800-FDA-1088. Where should I keep my medicine? This vaccine is only given in a clinic, pharmacy, doctor's office, or other health care setting and will not be stored at home. NOTE: This sheet is a summary. It may not cover all possible information. If you have questions about this medicine, talk to your doctor, pharmacist, or health care provider.    2016, Elsevier/Gold Standard. (2008-02-23 09:30:40)

## 2015-06-08 NOTE — Progress Notes (Signed)
Pre-visit discussion using our clinic review tool. No additional management support is needed unless otherwise documented below in the visit note.  

## 2015-06-10 ENCOUNTER — Encounter: Payer: Self-pay | Admitting: Internal Medicine

## 2015-06-10 NOTE — Assessment & Plan Note (Signed)
Low cholesterol diet and exercise.  Follow lipid panel.

## 2015-06-10 NOTE — Assessment & Plan Note (Signed)
Knee pain as outlined.  Followed by ortho.  Had bone scan.  Planning to f/u next week.

## 2015-06-10 NOTE — Assessment & Plan Note (Signed)
Blood pressure has been under good control.  Continue same medication regimen.  Follow pressures.  Follow metabolic panel.

## 2015-06-10 NOTE — Assessment & Plan Note (Signed)
Sees Dr Redmond Pulling.  Recently decreased sulfasalazine to bid.  No abdominal pain.  Bowels doing well.  Colonoscopy 11/2012.  Recommended f/u colonoscopy in 2017.

## 2015-06-11 ENCOUNTER — Other Ambulatory Visit: Payer: Self-pay | Admitting: Internal Medicine

## 2015-06-14 ENCOUNTER — Telehealth: Payer: Self-pay | Admitting: *Deleted

## 2015-06-14 NOTE — Telephone Encounter (Signed)
Pt.notified

## 2015-06-14 NOTE — Telephone Encounter (Signed)
Can try vitamin E 400 IU q day to see if any improvement.  If hot flashes continue, will need to schedule an appt to discuss other treatment options.

## 2015-06-14 NOTE — Telephone Encounter (Signed)
Please advise 

## 2015-06-14 NOTE — Telephone Encounter (Signed)
Patient stated that she's having hot flashes, that are worse at night. Patient requested a call back about what to do to help the hot flashes.

## 2015-07-20 ENCOUNTER — Ambulatory Visit (INDEPENDENT_AMBULATORY_CARE_PROVIDER_SITE_OTHER): Payer: Medicare Other

## 2015-07-20 VITALS — BP 122/68 | HR 60 | Temp 97.4°F | Resp 14 | Ht 65.0 in | Wt 152.4 lb

## 2015-07-20 DIAGNOSIS — Z0000 Encounter for general adult medical examination without abnormal findings: Secondary | ICD-10-CM

## 2015-07-20 NOTE — Progress Notes (Signed)
Subjective:   Ellen Drake is a 69 y.o. female who presents for an Initial Medicare Annual Wellness Visit.  Review of Systems    No ROS.  Medicare Wellness Visit.  Cardiac Risk Factors include: advanced age (>37mn, >>56women);hypertension     Objective:    Today's Vitals   07/20/15 1022  BP: 122/68  Pulse: 60  Temp: 97.4 F (36.3 C)  TempSrc: Oral  Resp: 14  Height: 5' 5"  (1.651 m)  Weight: 152 lb 6.4 oz (69.128 kg)  SpO2: 98%  PainSc: 5     Current Medications (verified) Outpatient Encounter Prescriptions as of 07/20/2015  Medication Sig  . aspirin 81 MG tablet Take 81 mg by mouth daily.  .Marland Kitchenatenolol (TENORMIN) 50 MG tablet TAKE 1 TABLET (50 MG TOTAL) BY MOUTH DAILY.  . brimonidine (ALPHAGAN) 0.2 % ophthalmic solution 1 drop 3 (three) times daily.  .Marland KitchenHYDROcodone-acetaminophen (NORCO/VICODIN) 5-325 MG per tablet Take 1 tablet by mouth every 6 (six) hours as needed. for pain  . latanoprost (XALATAN) 0.005 % ophthalmic solution 1 drop at bedtime.  .Marland Kitchenlisinopril (PRINIVIL,ZESTRIL) 40 MG tablet TAKE 1 TABLET (40 MG TOTAL) BY MOUTH DAILY.  .Marland KitchensulfaSALAzine (AZULFIDINE) 500 MG tablet Take 1,000 mg by mouth 3 (three) times daily.  . timolol (TIMOPTIC) 0.5 % ophthalmic solution 1 drop 2 (two) times daily.   .Marland Kitchentriamcinolone ointment (KENALOG) 0.5 % Apply 1 application topically 2 (two) times daily.  . vitamin E (VITAMIN E) 400 UNIT capsule Take 400 Units by mouth daily.   No facility-administered encounter medications on file as of 07/20/2015.    Allergies (verified) Mercaptopurine   History: Past Medical History  Diagnosis Date  . Hypertension   . Hypercholesterolemia   . [place]'s disease ([place]   . Fibrocystic breast disease   . Diverticulosis    Past Surgical History  Procedure Laterality Date  . Back surgery  1992    ruptured disc (Dr CMauri Pole  . Anterior cervical discectomy and fusion  1998    Dr DRolin Barry . [facility]nn Carbon  . Breast biopsy Left     neg-no scar seen   Family History  Problem Relation Age of Onset  . CVA Mother   . Hypertension Mother   . Hypertension Sister   . Breast cancer Neg Hx   . Colon cancer Neg Hx    Social History   Occupational History  . Not on file.   Social History Main Topics  . Smoking status: Former Smoker    Quit date: 08/11/2008  . Smokeless tobacco: Never Used  . Alcohol Use: 0.0 oz/week    0 Standard drinks or equivalent per week     Comment: wine occasional  . Drug Use: No  . Sexual Activity: Yes    Tobacco Counseling Counseling given: Not Answered   Activities of Daily Living In your present state of health, do you have any difficulty performing the following activities: 07/20/2015  Hearing? N  Vision? N  Difficulty concentrating or making decisions? N  Walking or climbing stairs? Y  Dressing or bathing? N  Doing errands, shopping? N  Preparing Food and eating ? N  Using the Toilet? N  In the past six months, have you accidently leaked urine? N  Do you have problems with loss of bowel control? N  Managing your Medications? N  Managing your Finances? N  Housekeeping or managing your Housekeeping? N  Immunizations and Health Maintenance Immunization History  Administered Date(s) Administered  . Influenza Split 05/13/2011, 08/12/2012, 04/29/2014  . Influenza,inj,Quad PF,36+ Mos 05/06/2013, 06/08/2015   Health Maintenance Due  Topic Date Due  . Hepatitis C Screening  September 11, 1945  . TETANUS/TDAP  11/18/1964  . ZOSTAVAX  11/18/2005  . PNA vac Low Risk Adult (1 of 2 - PCV13) 11/19/2010    Patient Care Team: Einar Pheasant, MD as PCP - General (Internal Medicine)  Indicate any recent Medical Services you may have received from other than [facility] providers in the past year (date may be approximate).     Assessment:   This is a routine wellness examination for Ellen Drake.  The goal of the wellness visit is to assist the patient how to  close the gaps in care and create a preventative care plan for the patient.   Bone Density/Osteoporosis risk reviewed.     Taking meds without issues; no barriers identified.  Safety issues reviewed; smoke detectors in the home. Firearms in the home secured in a locked area. Wears seatbelts when driving or riding with others. No violence in the home.  No identified risk were noted; The patient was oriented x 3; appropriate in dress and manner and no objective failures at ADL's or IADL's.   Overdue maintenance:  Hep C Screening, PCV13/PPSV23, TDAP, ZOSTAVAX, declined at this time, per patient request.  Prefers to wait until after knee replacement surgery is complete.  Patient Concerns:Hot flashes; current OTC Vitamin E is not working and she would like to try something else.  Deferred to PCP for follow up.  Upcoming knee replacement surgery in January; follow up with PCP as needed.  Hearing/Vision screen Hearing Screening Comments: Passes the whisper test Vision Screening Comments: Followed [facility] Annual visits Wears glasses   Dietary issues and exercise activities discussed: Current Exercise Habits:: The patient does not participate in regular exercise at present  Goals    . Increase physical activity     Start chair exercises as demonstrated, as tolerated.  Education provided.      Depression Screen PHQ 2/9 Scores 07/20/2015 02/06/2015 01/09/2014 02/05/2013 10/26/2012 09/06/2012  PHQ - 2 Score 0 0 0 0 0 0    Fall Risk Fall Risk  07/20/2015 02/06/2015 01/09/2014 02/05/2013 10/26/2012  Falls in the past year? No No No No No    Cognitive Function: MMSE - Mini Mental State Exam 07/20/2015  Orientation to time 5  Orientation to Place 5  Registration 3  Attention/ Calculation 5  Recall 3  Language- name 2 objects 2  Language- repeat 1  Language- follow 3 step command 3  Language- read & follow direction 1  Write a sentence 1  Copy design 1  Total score 30     Screening Tests Health Maintenance  Topic Date Due  . Hepatitis C Screening  October 10, 1945  . TETANUS/TDAP  11/18/1964  . ZOSTAVAX  11/18/2005  . PNA vac Low Risk Adult (1 of 2 - PCV13) 11/19/2010  . INFLUENZA VACCINE  03/11/2016  . MAMMOGRAM  04/23/2016  . COLONOSCOPY  12/01/2022  . DEXA SCAN  Completed      Plan:    End of life planning was discussed; aging in home or other; Advanced directives. 7 Step workbook for advance care planning education was provided to help her begin the conversation with loved ones at home. Time spent discussing HCPOA/Living Will short forms and planning guide is 27 minutes.    Follow up with PCP as  needed   During the course of the visit, Ellen Drake was educated and counseled about the following appropriate screening and preventive services:   Vaccines to include Pneumoccal, Influenza, Hepatitis B, Td, Zostavax, HCV  Electrocardiogram  Cardiovascular disease screening  Colorectal cancer screening  Bone density screening  Diabetes screening  Glaucoma screening  Mammography/PAP  Nutrition counseling  Smoking cessation counseling  Patient Instructions (the written plan) were given to the patient.    Varney Biles, LPN   09/12/1796    Reviewed above information.  Agree with plan.   Dr Nicki Reaper

## 2015-07-20 NOTE — Patient Instructions (Addendum)
Ellen Drake,  Thank you for taking time to come for your Medicare Wellness Visit.  I appreciate your ongoing commitment to your health goals. Please review the following plan we discussed and let me know if I can assist you in the future.  Happy Holidays!  Return in 10 days for labs  Return in January for follow up with Dr. Nicki Reaper   Health Maintenance, Female Adopting a healthy lifestyle and getting preventive care can go a long way to promote health and wellness. Talk with your health care provider about what schedule of regular examinations is right for you. This is a good chance for you to check in with your provider about disease prevention and staying healthy. In between checkups, there are plenty of things you can do on your own. Experts have done a lot of research about which lifestyle changes and preventive measures are most likely to keep you healthy. Ask your health care provider for more information. WEIGHT AND DIET  Eat a healthy diet  Be sure to include plenty of vegetables, fruits, low-fat dairy products, and lean protein.  Do not eat a lot of foods high in solid fats, added sugars, or salt.  Get regular exercise. This is one of the most important things you can do for your health.  Most adults should exercise for at least 150 minutes each week. The exercise should increase your heart rate and make you sweat (moderate-intensity exercise).  Most adults should also do strengthening exercises at least twice a week. This is in addition to the moderate-intensity exercise.  Maintain a healthy weight  Body mass index (BMI) is a measurement that can be used to identify possible weight problems. It estimates body fat based on height and weight. Your health care provider can help determine your BMI and help you achieve or maintain a healthy weight.  For females 69 years of age and older:   A BMI below 18.5 is considered underweight.  A BMI of 18.5 to 24.9 is normal.  A BMI of  25 to 29.9 is considered overweight.  A BMI of 30 and above is considered obese.  Watch levels of cholesterol and blood lipids  You should start having your blood tested for lipids and cholesterol at 69 years of age, then have this test every 5 years.  You may need to have your cholesterol levels checked more often if:  Your lipid or cholesterol levels are high.  You are older than 69 years of age.  You are at high risk for heart disease.  CANCER SCREENING   Lung Cancer  Lung cancer screening is recommended for adults 68-44 years old who are at high risk for lung cancer because of a history of smoking.  A yearly low-dose CT scan of the lungs is recommended for people who:  Currently smoke.  Have quit within the past 15 years.  Have at least a 30-pack-year history of smoking. A pack year is smoking an average of one pack of cigarettes a day for 1 year.  Yearly screening should continue until it has been 15 years since you quit.  Yearly screening should stop if you develop a health problem that would prevent you from having lung cancer treatment.  Breast Cancer  Practice breast self-awareness. This means understanding how your breasts normally appear and feel.  It also means doing regular breast self-exams. Let your health care provider know about any changes, no matter how small.  If you are in your 20s or 30s,  you should have a clinical breast exam (CBE) by a health care provider every 1-3 years as part of a regular health exam.  If you are 10 or older, have a CBE every year. Also consider having a breast X-ray (mammogram) every year.  If you have a family history of breast cancer, talk to your health care provider about genetic screening.  If you are at high risk for breast cancer, talk to your health care provider about having an MRI and a mammogram every year.  Breast cancer gene (BRCA) assessment is recommended for women who have family members with BRCA-related  cancers. BRCA-related cancers include:  Breast.  Ovarian.  Tubal.  Peritoneal cancers.  Results of the assessment will determine the need for genetic counseling and BRCA1 and BRCA2 testing. Cervical Cancer Your health care provider may recommend that you be screened regularly for cancer of the pelvic organs (ovaries, uterus, and vagina). This screening involves a pelvic examination, including checking for microscopic changes to the surface of your cervix (Pap test). You may be encouraged to have this screening done every 3 years, beginning at age 3.  For women ages 38-65, health care providers may recommend pelvic exams and Pap testing every 3 years, or they may recommend the Pap and pelvic exam, combined with testing for human papilloma virus (HPV), every 5 years. Some types of HPV increase your risk of cervical cancer. Testing for HPV may also be done on women of any age with unclear Pap test results.  Other health care providers may not recommend any screening for nonpregnant women who are considered low risk for pelvic cancer and who do not have symptoms. Ask your health care provider if a screening pelvic exam is right for you.  If you have had past treatment for cervical cancer or a condition that could lead to cancer, you need Pap tests and screening for cancer for at least 20 years after your treatment. If Pap tests have been discontinued, your risk factors (such as having a new sexual partner) need to be reassessed to determine if screening should resume. Some women have medical problems that increase the chance of getting cervical cancer. In these cases, your health care provider may recommend more frequent screening and Pap tests. Colorectal Cancer  This type of cancer can be detected and often prevented.  Routine colorectal cancer screening usually begins at 69 years of age and continues through 69 years of age.  Your health care provider may recommend screening at an earlier  age if you have risk factors for colon cancer.  Your health care provider may also recommend using home test kits to check for hidden blood in the stool.  A small camera at the end of a tube can be used to examine your colon directly (sigmoidoscopy or colonoscopy). This is done to check for the earliest forms of colorectal cancer.  Routine screening usually begins at age 35.  Direct examination of the colon should be repeated every 5-10 years through 69 years of age. However, you may need to be screened more often if early forms of precancerous polyps or small growths are found. Skin Cancer  Check your skin from head to toe regularly.  Tell your health care provider about any new moles or changes in moles, especially if there is a change in a mole's shape or color.  Also tell your health care provider if you have a mole that is larger than the size of a pencil eraser.  Always use  sunscreen. Apply sunscreen liberally and repeatedly throughout the day.  Protect yourself by wearing long sleeves, pants, a wide-brimmed hat, and sunglasses whenever you are outside. HEART DISEASE, DIABETES, AND HIGH BLOOD PRESSURE   High blood pressure causes heart disease and increases the risk of stroke. High blood pressure is more likely to develop in:  People who have blood pressure in the high end of the normal range (130-139/85-89 mm Hg).  People who are overweight or obese.  People who are African American.  If you are 20-75 years of age, have your blood pressure checked every 3-5 years. If you are 68 years of age or older, have your blood pressure checked every year. You should have your blood pressure measured twice--once when you are at a hospital or clinic, and once when you are not at a hospital or clinic. Record the average of the two measurements. To check your blood pressure when you are not at a hospital or clinic, you can use:  An automated blood pressure machine at a pharmacy.  A home  blood pressure monitor.  If you are between 29 years and 12 years old, ask your health care provider if you should take aspirin to prevent strokes.  Have regular diabetes screenings. This involves taking a blood sample to check your fasting blood sugar level.  If you are at a normal weight and have a low risk for diabetes, have this test once every three years after 69 years of age.  If you are overweight and have a high risk for diabetes, consider being tested at a younger age or more often. PREVENTING INFECTION  Hepatitis B  If you have a higher risk for hepatitis B, you should be screened for this virus. You are considered at high risk for hepatitis B if:  You were born in a country where hepatitis B is common. Ask your health care provider which countries are considered high risk.  Your parents were born in a high-risk country, and you have not been immunized against hepatitis B (hepatitis B vaccine).  You have HIV or AIDS.  You use needles to inject street drugs.  You live with someone who has hepatitis B.  You have had sex with someone who has hepatitis B.  You get hemodialysis treatment.  You take certain medicines for conditions, including cancer, organ transplantation, and autoimmune conditions. Hepatitis C  Blood testing is recommended for:  Everyone born from [birth year] through 1965.  Anyone with known risk factors for hepatitis C. Sexually transmitted infections (STIs)  You should be screened for sexually transmitted infections (STIs) including gonorrhea and chlamydia if:  You are sexually active and are younger than 69 years of age.  You are older than 69 years of age and your health care provider tells you that you are at risk for this type of infection.  Your sexual activity has changed since you were last screened and you are at an increased risk for chlamydia or gonorrhea. Ask your health care provider if you are at risk.  If you do not have HIV, but are at  risk, it may be recommended that you take a prescription medicine daily to prevent HIV infection. This is called pre-exposure prophylaxis (PrEP). You are considered at risk if:  You are sexually active and do not regularly use condoms or know the HIV status of your partner(s).  You take drugs by injection.  You are sexually active with a partner who has HIV. Talk with your health care provider  about whether you are at high risk of being infected with HIV. If you choose to begin PrEP, you should first be tested for HIV. You should then be tested every 3 months for as long as you are taking PrEP.  PREGNANCY   If you are premenopausal and you may become pregnant, ask your health care provider about preconception counseling.  If you may become pregnant, take 400 to 800 micrograms (mcg) of folic acid every day.  If you want to prevent pregnancy, talk to your health care provider about birth control (contraception). OSTEOPOROSIS AND MENOPAUSE   Osteoporosis is a disease in which the bones lose minerals and strength with aging. This can result in serious bone fractures. Your risk for osteoporosis can be identified using a bone density scan.  If you are 61 years of age or older, or if you are at risk for osteoporosis and fractures, ask your health care provider if you should be screened.  Ask your health care provider whether you should take a calcium or vitamin D supplement to lower your risk for osteoporosis.  Menopause may have certain physical symptoms and risks.  Hormone replacement therapy may reduce some of these symptoms and risks. Talk to your health care provider about whether hormone replacement therapy is right for you.  HOME CARE INSTRUCTIONS   Schedule regular health, dental, and eye exams.  Stay current with your immunizations.   Do not use any tobacco products including cigarettes, chewing tobacco, or electronic cigarettes.  If you are pregnant, do not drink alcohol.  If  you are breastfeeding, limit how much and how often you drink alcohol.  Limit alcohol intake to no more than 1 drink per day for nonpregnant women. One drink equals 12 ounces of beer, 5 ounces of wine, or 1 ounces of hard liquor.  Do not use street drugs.  Do not share needles.  Ask your health care provider for help if you need support or information about quitting drugs.  Tell your health care provider if you often feel depressed.  Tell your health care provider if you have ever been abused or do not feel safe at home.   This information is not intended to replace advice given to you by your health care provider. Make sure you discuss any questions you have with your health care provider.   Document Released: 02/10/2011 Document Revised: 08/18/2014 Document Reviewed: 06/29/2013 Elsevier Interactive Patient Education [employer].

## 2015-07-30 ENCOUNTER — Other Ambulatory Visit (INDEPENDENT_AMBULATORY_CARE_PROVIDER_SITE_OTHER): Payer: Medicare Other

## 2015-07-30 DIAGNOSIS — E7800 Pure hypercholesterolemia, unspecified: Secondary | ICD-10-CM | POA: Diagnosis not present

## 2015-07-30 DIAGNOSIS — I10 Essential (primary) hypertension: Secondary | ICD-10-CM

## 2015-07-30 LAB — BASIC METABOLIC PANEL
BUN: 16 mg/dL (ref 6–23)
CO2: 27 mEq/L (ref 19–32)
Calcium: 9.2 mg/dL (ref 8.4–10.5)
Chloride: 109 mEq/L (ref 96–112)
Creatinine, Ser: 0.77 mg/dL (ref 0.40–1.20)
GFR: 95.39 mL/min (ref >60.00)
Glucose, Bld: 90 mg/dL (ref 70–99)
Potassium: 4.3 mEq/L (ref 3.5–5.1)
Sodium: 144 mEq/L (ref 135–145)

## 2015-07-30 LAB — LIPID PANEL
Cholesterol: 215 mg/dL — ABNORMAL HIGH (ref 0–200)
HDL: 49.7 mg/dL (ref >39.00)
LDL Cholesterol: 145 mg/dL — ABNORMAL HIGH (ref 0–99)
NonHDL: 165.44
Total CHOL/HDL Ratio: 4
Triglycerides: 101 mg/dL (ref 0.0–149.0)
VLDL: 20.2 mg/dL (ref 0.0–40.0)

## 2015-07-30 LAB — HEPATIC FUNCTION PANEL
ALT: 10 U/L (ref 0–35)
AST: 17 U/L (ref 0–37)
Albumin: 4 g/dL (ref 3.5–5.2)
Alkaline Phosphatase: 68 U/L (ref 39–117)
Bilirubin, Direct: 0.1 mg/dL (ref 0.0–0.3)
Total Bilirubin: 0.4 mg/dL (ref 0.2–1.2)
Total Protein: 6.4 g/dL (ref 6.0–8.3)

## 2015-07-31 ENCOUNTER — Encounter: Payer: Self-pay | Admitting: *Deleted

## 2015-08-03 ENCOUNTER — Ambulatory Visit: Payer: Medicare Other | Admitting: Internal Medicine

## 2015-08-15 ENCOUNTER — Ambulatory Visit (INDEPENDENT_AMBULATORY_CARE_PROVIDER_SITE_OTHER): Payer: Medicare Other | Admitting: Internal Medicine

## 2015-08-15 ENCOUNTER — Encounter: Payer: Self-pay | Admitting: Internal Medicine

## 2015-08-15 VITALS — BP 146/78 | HR 64 | Temp 97.9°F | Resp 18 | Ht 64.5 in | Wt 151.5 lb

## 2015-08-15 DIAGNOSIS — K5010 Crohn's disease of large intestine without complications: Secondary | ICD-10-CM

## 2015-08-15 DIAGNOSIS — I10 Essential (primary) hypertension: Secondary | ICD-10-CM | POA: Diagnosis not present

## 2015-08-15 DIAGNOSIS — M25569 Pain in unspecified knee: Secondary | ICD-10-CM | POA: Diagnosis not present

## 2015-08-15 DIAGNOSIS — E7800 Pure hypercholesterolemia, unspecified: Secondary | ICD-10-CM

## 2015-08-15 NOTE — Progress Notes (Signed)
Patient ID: Ellen Drake, female   DOB: 01/16/1946, 70 y.o.   MRN: [id number]   Subjective:    Patient ID: Ellen Drake, female    DOB: [birth year], 70 y.o.   MRN: [id number]  HPI  Patient with past history of hypercholesterolemia, Crohn's disease and hypertension.  She comes in today to follow up on these issues.  She is planning to have knee surgery.  Surgery to be performed by Dr Gerrit Heck.  She has been having knee issues for two years.  Increased pain and swelling.  Limits her activity.  Denies any chest pain or tightness.  No sob.  No acid reflux.  No abdominal pain or cramping.  Bowels stable.  She does report noticing some hot flashes.  Takes pain medication for her knee.  Going for pre op.  Discussed w/up.  Will get her w/up there (EKG, etc).     Past Medical History  Diagnosis Date  . Hypertension   . Hypercholesterolemia   . Crohn's disease ([place])   . Fibrocystic breast disease   . Diverticulosis    Past Surgical History  Procedure Laterality Date  . Back surgery  1992    ruptured disc (Dr Mauri Pole)  . Anterior cervical discectomy and fusion  1998    Dr Rolin Barry  . Knee surgery Right 1/16    [facility] (Dr. Sherlynn Carbon)  . Breast biopsy Left     neg-no scar seen   Family History  Problem Relation Age of Onset  . CVA Mother   . Hypertension Mother   . Hypertension Sister   . Breast cancer Neg Hx   . Colon cancer Neg Hx    Social History   Social History  . Marital Status: Married    Spouse Name: N/A  . Number of Children: 0  . Years of Education: N/A   Social History Main Topics  . Smoking status: Former Smoker    Quit date: 08/11/2008  . Smokeless tobacco: Never Used  . Alcohol Use: 0.0 oz/week    0 Standard drinks or equivalent per week     Comment: wine occasional  . Drug Use: No  . Sexual Activity: Yes   Other Topics Concern  . None   Social History Narrative    Outpatient Encounter Prescriptions as of 08/15/2015  Medication Sig  . aspirin 81  MG tablet Take 81 mg by mouth daily.  Marland Kitchen atenolol (TENORMIN) 50 MG tablet TAKE 1 TABLET (50 MG TOTAL) BY MOUTH DAILY.  . brimonidine (ALPHAGAN) 0.2 % ophthalmic solution 1 drop 3 (three) times daily.  Marland Kitchen HYDROcodone-acetaminophen (NORCO/VICODIN) 5-325 MG per tablet Take 1 tablet by mouth every 6 (six) hours as needed. for pain  . latanoprost (XALATAN) 0.005 % ophthalmic solution 1 drop at bedtime.  Marland Kitchen lisinopril (PRINIVIL,ZESTRIL) 40 MG tablet TAKE 1 TABLET (40 MG TOTAL) BY MOUTH DAILY.  Marland Kitchen sulfaSALAzine (AZULFIDINE) 500 MG tablet Take 1,000 mg by mouth 3 (three) times daily.  . timolol (TIMOPTIC) 0.5 % ophthalmic solution 1 drop 2 (two) times daily.   Marland Kitchen triamcinolone ointment (KENALOG) 0.5 % Apply 1 application topically 2 (two) times daily.  . vitamin E (VITAMIN E) 400 UNIT capsule Take 400 Units by mouth daily.   No facility-administered encounter medications on file as of 08/15/2015.    Review of Systems  Constitutional: Negative for appetite change and unexpected weight change.  HENT: Negative for congestion and sinus pressure.   Respiratory: Negative for cough, chest tightness and shortness of breath.  Cardiovascular: Negative for chest pain, palpitations and leg swelling.  Gastrointestinal: Negative for nausea, vomiting, abdominal pain and diarrhea.  Genitourinary: Negative for dysuria and difficulty urinating.  Musculoskeletal: Negative for back pain and joint swelling.       Persistent knee pain as outlined.  Planning for surgery.    Skin: Negative for color change and rash.  Neurological: Negative for dizziness, light-headedness and headaches.  Psychiatric/Behavioral: Negative for dysphoric mood and agitation.       Objective:     Blood pressure rechecked by me:  130/82  Physical Exam  Constitutional: She appears well-developed and well-nourished. No distress.  HENT:  Nose: Nose normal.  Mouth/Throat: Oropharynx is clear and moist.  Eyes: Conjunctivae are normal. Right eye  exhibits no discharge. Left eye exhibits no discharge.  Neck: Neck supple. No thyromegaly present.  Cardiovascular: Normal rate and regular rhythm.   Pulmonary/Chest: Breath sounds normal. No respiratory distress. She has no wheezes.  Abdominal: Soft. Bowel sounds are normal. There is no tenderness.  Musculoskeletal: She exhibits no edema or tenderness.  Lymphadenopathy:    She has no cervical adenopathy.  Skin: No rash noted. No erythema.  Psychiatric: She has a normal mood and affect. Her behavior is normal.    BP 146/78 mmHg  Pulse 64  Temp(Src) 97.9 F (36.6 C) (Oral)  Resp 18  Ht 5' 4.5" (1.638 m)  Wt 151 lb 8 oz (68.72 kg)  BMI 25.61 kg/m2  SpO2 97%  LMP 08/13/1995 Wt Readings from Last 3 Encounters:  08/15/15 151 lb 8 oz (68.72 kg)  07/20/15 152 lb 6.4 oz (69.128 kg)  06/08/15 152 lb (68.947 kg)     Lab Results  Component Value Date   WBC 7.5 06/05/2014   HGB 12.4 06/05/2014   HCT 0* 06/05/2014   PLT 177 06/05/2014   GLUCOSE 90 07/30/2015   CHOL 215* 07/30/2015   TRIG 101.0 07/30/2015   HDL 49.70 07/30/2015   LDLDIRECT 132.5 05/06/2013   LDLCALC 145* 07/30/2015   ALT 10 07/30/2015   AST 17 07/30/2015   NA 144 07/30/2015   K 4.3 07/30/2015   CL 109 07/30/2015   CREATININE 0.77 07/30/2015   BUN 16 07/30/2015   CO2 27 07/30/2015   TSH 1.53 09/29/2014   HGBA1C 5.7 10/19/2012    Mm Digital Screening Bilateral  04/24/2015  CLINICAL DATA:  Screening. EXAM: DIGITAL SCREENING BILATERAL MAMMOGRAM WITH CAD COMPARISON:  Previous exam(s). ACR Breast Density Category c: The breast tissue is heterogeneously dense, which may obscure small masses. FINDINGS: There are no findings suspicious for malignancy. Images were processed with CAD. IMPRESSION: No mammographic evidence of malignancy. A result letter of this screening mammogram will be mailed directly to the patient. RECOMMENDATION: Screening mammogram in one year. (Code:SM-B-01Y) BI-RADS CATEGORY  1: Negative.  Electronically Signed   By: Everlean Alstrom M.D.   On: 04/24/2015 10:53       Assessment & Plan:   Problem List Items Addressed This Visit    Crohn's disease ([place])    Followed by Dr Redmond Pulling.  On sulfasalazine.  No abdominal pain.  Bowels stable.        Relevant Orders   TSH   Hypercholesterolemia    Low cholesterol diet and exercise.  Persistent elevated LDL - 140s.  Discussed with her today.  Discussed further treatment.  She declines medication at this time.  Follow.        Relevant Orders   Lipid panel   Hepatic function panel  Hypertension - Primary    Blood pressure under good control.  Continue same medication regimen.  Follow pressures.  Follow metabolic panel.  Will need close intra op and post op monitoring of her heart rate and blood pressure to avoid extremes.  Declines further w/up here.  States will get her EKG and pre op at her pre op evaluation.  Currently asymptomatic.       Relevant Orders   CBC with Differential/Platelet   Basic metabolic panel   Knee pain    Persistent knee pain as outlined.  Seeing Dr Gerrit Heck.  Planning for knee surgery.            Einar Pheasant, MD

## 2015-08-15 NOTE — Progress Notes (Signed)
Pre-visit discussion using our clinic review tool. No additional management support is needed unless otherwise documented below in the visit note.  

## 2015-08-17 DIAGNOSIS — I10 Essential (primary) hypertension: Secondary | ICD-10-CM | POA: Diagnosis not present

## 2015-08-19 ENCOUNTER — Encounter: Payer: Self-pay | Admitting: Internal Medicine

## 2015-08-19 NOTE — Assessment & Plan Note (Signed)
Blood pressure under good control.  Continue same medication regimen.  Follow pressures.  Follow metabolic panel.  Will need close intra op and post op monitoring of her heart rate and blood pressure to avoid extremes.  Declines further w/up here.  States will get her EKG and pre op at her pre op evaluation.  Currently asymptomatic.

## 2015-08-19 NOTE — Assessment & Plan Note (Signed)
Followed by Dr Redmond Pulling.  On sulfasalazine.  No abdominal pain.  Bowels stable.

## 2015-08-19 NOTE — Assessment & Plan Note (Signed)
Low cholesterol diet and exercise.  Persistent elevated LDL - 140s.  Discussed with her today.  Discussed further treatment.  She declines medication at this time.  Follow.

## 2015-08-19 NOTE — Assessment & Plan Note (Signed)
Persistent knee pain as outlined.  Seeing Dr Gerrit Heck.  Planning for knee surgery.

## 2015-08-21 DIAGNOSIS — T8484XA Pain due to internal orthopedic prosthetic devices, implants and grafts, initial encounter: Secondary | ICD-10-CM | POA: Diagnosis not present

## 2015-08-21 DIAGNOSIS — H409 Unspecified glaucoma: Secondary | ICD-10-CM | POA: Diagnosis not present

## 2015-08-21 DIAGNOSIS — R001 Bradycardia, unspecified: Secondary | ICD-10-CM | POA: Diagnosis not present

## 2015-08-21 DIAGNOSIS — D649 Anemia, unspecified: Secondary | ICD-10-CM | POA: Diagnosis not present

## 2015-08-21 DIAGNOSIS — M25561 Pain in right knee: Secondary | ICD-10-CM | POA: Diagnosis not present

## 2015-08-21 DIAGNOSIS — I10 Essential (primary) hypertension: Secondary | ICD-10-CM | POA: Diagnosis not present

## 2015-08-21 DIAGNOSIS — T84092A Other mechanical complication of internal right knee prosthesis, initial encounter: Secondary | ICD-10-CM | POA: Diagnosis not present

## 2015-08-21 DIAGNOSIS — G8918 Other acute postprocedural pain: Secondary | ICD-10-CM | POA: Diagnosis not present

## 2015-08-21 DIAGNOSIS — K5090 Crohn's disease, unspecified, without complications: Secondary | ICD-10-CM | POA: Diagnosis not present

## 2015-08-21 DIAGNOSIS — M1711 Unilateral primary osteoarthritis, right knee: Secondary | ICD-10-CM | POA: Diagnosis not present

## 2015-08-21 DIAGNOSIS — Z96651 Presence of right artificial knee joint: Secondary | ICD-10-CM | POA: Diagnosis not present

## 2015-08-25 DIAGNOSIS — Z96651 Presence of right artificial knee joint: Secondary | ICD-10-CM | POA: Diagnosis not present

## 2015-08-25 DIAGNOSIS — I10 Essential (primary) hypertension: Secondary | ICD-10-CM | POA: Diagnosis not present

## 2015-08-25 DIAGNOSIS — H409 Unspecified glaucoma: Secondary | ICD-10-CM | POA: Diagnosis not present

## 2015-08-27 DIAGNOSIS — I10 Essential (primary) hypertension: Secondary | ICD-10-CM | POA: Diagnosis not present

## 2015-08-27 DIAGNOSIS — H409 Unspecified glaucoma: Secondary | ICD-10-CM | POA: Diagnosis not present

## 2015-08-27 DIAGNOSIS — Z96651 Presence of right artificial knee joint: Secondary | ICD-10-CM | POA: Diagnosis not present

## 2015-08-29 DIAGNOSIS — H409 Unspecified glaucoma: Secondary | ICD-10-CM | POA: Diagnosis not present

## 2015-08-29 DIAGNOSIS — Z96651 Presence of right artificial knee joint: Secondary | ICD-10-CM | POA: Diagnosis not present

## 2015-08-29 DIAGNOSIS — I10 Essential (primary) hypertension: Secondary | ICD-10-CM | POA: Diagnosis not present

## 2015-08-31 DIAGNOSIS — I10 Essential (primary) hypertension: Secondary | ICD-10-CM | POA: Diagnosis not present

## 2015-08-31 DIAGNOSIS — Z96651 Presence of right artificial knee joint: Secondary | ICD-10-CM | POA: Diagnosis not present

## 2015-08-31 DIAGNOSIS — H409 Unspecified glaucoma: Secondary | ICD-10-CM | POA: Diagnosis not present

## 2015-09-03 DIAGNOSIS — I10 Essential (primary) hypertension: Secondary | ICD-10-CM | POA: Diagnosis not present

## 2015-09-03 DIAGNOSIS — Z96651 Presence of right artificial knee joint: Secondary | ICD-10-CM | POA: Diagnosis not present

## 2015-09-03 DIAGNOSIS — H409 Unspecified glaucoma: Secondary | ICD-10-CM | POA: Diagnosis not present

## 2015-09-05 DIAGNOSIS — H409 Unspecified glaucoma: Secondary | ICD-10-CM | POA: Diagnosis not present

## 2015-09-05 DIAGNOSIS — Z96651 Presence of right artificial knee joint: Secondary | ICD-10-CM | POA: Diagnosis not present

## 2015-09-05 DIAGNOSIS — I10 Essential (primary) hypertension: Secondary | ICD-10-CM | POA: Diagnosis not present

## 2015-09-07 DIAGNOSIS — Z96651 Presence of right artificial knee joint: Secondary | ICD-10-CM | POA: Diagnosis not present

## 2015-09-07 DIAGNOSIS — I10 Essential (primary) hypertension: Secondary | ICD-10-CM | POA: Diagnosis not present

## 2015-09-07 DIAGNOSIS — H409 Unspecified glaucoma: Secondary | ICD-10-CM | POA: Diagnosis not present

## 2015-09-10 DIAGNOSIS — H409 Unspecified glaucoma: Secondary | ICD-10-CM | POA: Diagnosis not present

## 2015-09-10 DIAGNOSIS — I10 Essential (primary) hypertension: Secondary | ICD-10-CM | POA: Diagnosis not present

## 2015-09-10 DIAGNOSIS — Z96651 Presence of right artificial knee joint: Secondary | ICD-10-CM | POA: Diagnosis not present

## 2015-09-12 DIAGNOSIS — I10 Essential (primary) hypertension: Secondary | ICD-10-CM | POA: Diagnosis not present

## 2015-09-12 DIAGNOSIS — H409 Unspecified glaucoma: Secondary | ICD-10-CM | POA: Diagnosis not present

## 2015-09-12 DIAGNOSIS — Z96651 Presence of right artificial knee joint: Secondary | ICD-10-CM | POA: Diagnosis not present

## 2015-09-14 DIAGNOSIS — H409 Unspecified glaucoma: Secondary | ICD-10-CM | POA: Diagnosis not present

## 2015-09-14 DIAGNOSIS — I10 Essential (primary) hypertension: Secondary | ICD-10-CM | POA: Diagnosis not present

## 2015-09-14 DIAGNOSIS — Z96651 Presence of right artificial knee joint: Secondary | ICD-10-CM | POA: Diagnosis not present

## 2015-09-18 DIAGNOSIS — I10 Essential (primary) hypertension: Secondary | ICD-10-CM | POA: Diagnosis not present

## 2015-09-18 DIAGNOSIS — H409 Unspecified glaucoma: Secondary | ICD-10-CM | POA: Diagnosis not present

## 2015-09-18 DIAGNOSIS — Z96651 Presence of right artificial knee joint: Secondary | ICD-10-CM | POA: Diagnosis not present

## 2015-09-21 DIAGNOSIS — Z96651 Presence of right artificial knee joint: Secondary | ICD-10-CM | POA: Diagnosis not present

## 2015-09-21 DIAGNOSIS — H409 Unspecified glaucoma: Secondary | ICD-10-CM | POA: Diagnosis not present

## 2015-09-21 DIAGNOSIS — I10 Essential (primary) hypertension: Secondary | ICD-10-CM | POA: Diagnosis not present

## 2015-09-24 DIAGNOSIS — Z96651 Presence of right artificial knee joint: Secondary | ICD-10-CM | POA: Diagnosis not present

## 2015-09-24 DIAGNOSIS — I10 Essential (primary) hypertension: Secondary | ICD-10-CM | POA: Diagnosis not present

## 2015-09-24 DIAGNOSIS — H409 Unspecified glaucoma: Secondary | ICD-10-CM | POA: Diagnosis not present

## 2015-09-27 DIAGNOSIS — H409 Unspecified glaucoma: Secondary | ICD-10-CM | POA: Diagnosis not present

## 2015-09-27 DIAGNOSIS — I10 Essential (primary) hypertension: Secondary | ICD-10-CM | POA: Diagnosis not present

## 2015-09-27 DIAGNOSIS — Z96651 Presence of right artificial knee joint: Secondary | ICD-10-CM | POA: Diagnosis not present

## 2015-10-01 DIAGNOSIS — Z96651 Presence of right artificial knee joint: Secondary | ICD-10-CM | POA: Diagnosis not present

## 2015-10-15 DIAGNOSIS — Z96659 Presence of unspecified artificial knee joint: Secondary | ICD-10-CM | POA: Diagnosis not present

## 2015-10-15 DIAGNOSIS — M25661 Stiffness of right knee, not elsewhere classified: Secondary | ICD-10-CM | POA: Diagnosis not present

## 2015-10-15 DIAGNOSIS — M25561 Pain in right knee: Secondary | ICD-10-CM | POA: Diagnosis not present

## 2015-10-15 DIAGNOSIS — M6281 Muscle weakness (generalized): Secondary | ICD-10-CM | POA: Diagnosis not present

## 2015-10-18 DIAGNOSIS — M25661 Stiffness of right knee, not elsewhere classified: Secondary | ICD-10-CM | POA: Diagnosis not present

## 2015-10-18 DIAGNOSIS — Z96659 Presence of unspecified artificial knee joint: Secondary | ICD-10-CM | POA: Diagnosis not present

## 2015-10-18 DIAGNOSIS — M25561 Pain in right knee: Secondary | ICD-10-CM | POA: Diagnosis not present

## 2015-10-18 DIAGNOSIS — M6281 Muscle weakness (generalized): Secondary | ICD-10-CM | POA: Diagnosis not present

## 2015-10-22 DIAGNOSIS — M25561 Pain in right knee: Secondary | ICD-10-CM | POA: Diagnosis not present

## 2015-10-22 DIAGNOSIS — M6281 Muscle weakness (generalized): Secondary | ICD-10-CM | POA: Diagnosis not present

## 2015-10-22 DIAGNOSIS — Z96659 Presence of unspecified artificial knee joint: Secondary | ICD-10-CM | POA: Diagnosis not present

## 2015-10-22 DIAGNOSIS — M25661 Stiffness of right knee, not elsewhere classified: Secondary | ICD-10-CM | POA: Diagnosis not present

## 2015-10-26 DIAGNOSIS — M25561 Pain in right knee: Secondary | ICD-10-CM | POA: Diagnosis not present

## 2015-10-26 DIAGNOSIS — M6281 Muscle weakness (generalized): Secondary | ICD-10-CM | POA: Diagnosis not present

## 2015-10-26 DIAGNOSIS — M25661 Stiffness of right knee, not elsewhere classified: Secondary | ICD-10-CM | POA: Diagnosis not present

## 2015-10-30 DIAGNOSIS — M25561 Pain in right knee: Secondary | ICD-10-CM | POA: Diagnosis not present

## 2015-10-30 DIAGNOSIS — M6281 Muscle weakness (generalized): Secondary | ICD-10-CM | POA: Diagnosis not present

## 2015-11-02 DIAGNOSIS — Z96659 Presence of unspecified artificial knee joint: Secondary | ICD-10-CM | POA: Diagnosis not present

## 2015-11-02 DIAGNOSIS — M25561 Pain in right knee: Secondary | ICD-10-CM | POA: Diagnosis not present

## 2015-11-02 DIAGNOSIS — M25661 Stiffness of right knee, not elsewhere classified: Secondary | ICD-10-CM | POA: Diagnosis not present

## 2015-11-02 DIAGNOSIS — M6281 Muscle weakness (generalized): Secondary | ICD-10-CM | POA: Diagnosis not present

## 2015-11-05 DIAGNOSIS — Z96659 Presence of unspecified artificial knee joint: Secondary | ICD-10-CM | POA: Diagnosis not present

## 2015-11-05 DIAGNOSIS — M6281 Muscle weakness (generalized): Secondary | ICD-10-CM | POA: Diagnosis not present

## 2015-11-05 DIAGNOSIS — M25661 Stiffness of right knee, not elsewhere classified: Secondary | ICD-10-CM | POA: Diagnosis not present

## 2015-11-05 DIAGNOSIS — M25561 Pain in right knee: Secondary | ICD-10-CM | POA: Diagnosis not present

## 2015-11-12 DIAGNOSIS — M6281 Muscle weakness (generalized): Secondary | ICD-10-CM | POA: Diagnosis not present

## 2015-11-12 DIAGNOSIS — M25561 Pain in right knee: Secondary | ICD-10-CM | POA: Diagnosis not present

## 2015-11-12 DIAGNOSIS — Z96659 Presence of unspecified artificial knee joint: Secondary | ICD-10-CM | POA: Diagnosis not present

## 2015-11-12 DIAGNOSIS — M25661 Stiffness of right knee, not elsewhere classified: Secondary | ICD-10-CM | POA: Diagnosis not present

## 2015-11-15 DIAGNOSIS — Z96659 Presence of unspecified artificial knee joint: Secondary | ICD-10-CM | POA: Diagnosis not present

## 2015-11-15 DIAGNOSIS — M25561 Pain in right knee: Secondary | ICD-10-CM | POA: Diagnosis not present

## 2015-11-15 DIAGNOSIS — M25661 Stiffness of right knee, not elsewhere classified: Secondary | ICD-10-CM | POA: Diagnosis not present

## 2015-11-15 DIAGNOSIS — M6281 Muscle weakness (generalized): Secondary | ICD-10-CM | POA: Diagnosis not present

## 2015-11-16 DIAGNOSIS — H401132 Primary open-angle glaucoma, bilateral, moderate stage: Secondary | ICD-10-CM | POA: Diagnosis not present

## 2015-11-20 DIAGNOSIS — Z96659 Presence of unspecified artificial knee joint: Secondary | ICD-10-CM | POA: Diagnosis not present

## 2015-11-20 DIAGNOSIS — M25661 Stiffness of right knee, not elsewhere classified: Secondary | ICD-10-CM | POA: Diagnosis not present

## 2015-11-20 DIAGNOSIS — M6281 Muscle weakness (generalized): Secondary | ICD-10-CM | POA: Diagnosis not present

## 2015-11-20 DIAGNOSIS — M25561 Pain in right knee: Secondary | ICD-10-CM | POA: Diagnosis not present

## 2015-11-21 DIAGNOSIS — Z96651 Presence of right artificial knee joint: Secondary | ICD-10-CM | POA: Diagnosis not present

## 2015-11-22 DIAGNOSIS — M6281 Muscle weakness (generalized): Secondary | ICD-10-CM | POA: Diagnosis not present

## 2015-11-22 DIAGNOSIS — M25561 Pain in right knee: Secondary | ICD-10-CM | POA: Diagnosis not present

## 2015-11-22 DIAGNOSIS — M25661 Stiffness of right knee, not elsewhere classified: Secondary | ICD-10-CM | POA: Diagnosis not present

## 2015-11-22 DIAGNOSIS — Z96659 Presence of unspecified artificial knee joint: Secondary | ICD-10-CM | POA: Diagnosis not present

## 2015-11-27 ENCOUNTER — Ambulatory Visit (INDEPENDENT_AMBULATORY_CARE_PROVIDER_SITE_OTHER): Payer: Medicare Other | Admitting: Internal Medicine

## 2015-11-27 ENCOUNTER — Encounter: Payer: Self-pay | Admitting: Internal Medicine

## 2015-11-27 VITALS — BP 138/80 | HR 56 | Temp 97.5°F | Resp 18 | Ht 64.5 in | Wt 150.4 lb

## 2015-11-27 DIAGNOSIS — E7800 Pure hypercholesterolemia, unspecified: Secondary | ICD-10-CM | POA: Diagnosis not present

## 2015-11-27 DIAGNOSIS — K5010 Crohn's disease of large intestine without complications: Secondary | ICD-10-CM | POA: Diagnosis not present

## 2015-11-27 DIAGNOSIS — M25569 Pain in unspecified knee: Secondary | ICD-10-CM

## 2015-11-27 DIAGNOSIS — M6281 Muscle weakness (generalized): Secondary | ICD-10-CM | POA: Diagnosis not present

## 2015-11-27 DIAGNOSIS — M25561 Pain in right knee: Secondary | ICD-10-CM | POA: Diagnosis not present

## 2015-11-27 DIAGNOSIS — I10 Essential (primary) hypertension: Secondary | ICD-10-CM | POA: Diagnosis not present

## 2015-11-27 DIAGNOSIS — Z96659 Presence of unspecified artificial knee joint: Secondary | ICD-10-CM | POA: Diagnosis not present

## 2015-11-27 DIAGNOSIS — M25661 Stiffness of right knee, not elsewhere classified: Secondary | ICD-10-CM | POA: Diagnosis not present

## 2015-11-27 NOTE — Progress Notes (Signed)
Pre-visit discussion using our clinic review tool. No additional management support is needed unless otherwise documented below in the visit note.  

## 2015-11-27 NOTE — Progress Notes (Signed)
Patient ID: Ellen Drake, female   DOB: 11/19/1945, 70 y.o.   MRN: [id number]   Subjective:    Patient ID: Ellen Drake, female    DOB: Jan 06, 1946, 70 y.o.   MRN: [id number]  HPI  Patient here for a scheduled follow up.  She is still having issues with her knee.  Has had four surgeries.  Still with pain and swelling.  Is frustrated.  Has been in therapy for the past 6 weeks.  She feels is aggravating her knee.  Being referred to nerve specialist clinic.  Has appt 12/11/15.  Has tried lyrica - hands swelled.  Also reports broke out with dilaudid.  No chest pain.  Breathing stable.  No acid reflux.  No abdominal pain or cramping.  Bowels stable.  States blood pressures are averaging 323-557D systolic.  Colonoscopy scheduled for 02/2016.     Past Medical History  Diagnosis Date  . Hypertension   . Hypercholesterolemia   . Crohn's disease ([place])   . Fibrocystic breast disease   . Diverticulosis    Past Surgical History  Procedure Laterality Date  . Back surgery  1992    ruptured disc (Dr Mauri Pole)  . Anterior cervical discectomy and fusion  1998    Dr Rolin Barry  . Knee surgery Right 1/16    [facility] (Dr. Sherlynn Carbon)  . Breast biopsy Left     neg-no scar seen   Family History  Problem Relation Age of Onset  . CVA Mother   . Hypertension Mother   . Hypertension Sister   . Breast cancer Neg Hx   . Colon cancer Neg Hx    Social History   Social History  . Marital Status: Married    Spouse Name: N/A  . Number of Children: 0  . Years of Education: N/A   Social History Main Topics  . Smoking status: Former Smoker    Quit date: 08/11/2008  . Smokeless tobacco: Never Used  . Alcohol Use: 0.0 oz/week    0 Standard drinks or equivalent per week     Comment: wine occasional  . Drug Use: No  . Sexual Activity: Yes   Other Topics Concern  . None   Social History Narrative    Outpatient Encounter Prescriptions as of 11/27/2015  Medication Sig  . aspirin 81 MG tablet Take 81  mg by mouth daily.  Marland Kitchen atenolol (TENORMIN) 50 MG tablet TAKE 1 TABLET (50 MG TOTAL) BY MOUTH DAILY.  . brimonidine (ALPHAGAN) 0.2 % ophthalmic solution 1 drop 3 (three) times daily.  Marland Kitchen HYDROcodone-acetaminophen (NORCO/VICODIN) 5-325 MG per tablet Take 1 tablet by mouth every 6 (six) hours as needed. for pain  . latanoprost (XALATAN) 0.005 % ophthalmic solution 1 drop at bedtime.  Marland Kitchen lisinopril (PRINIVIL,ZESTRIL) 40 MG tablet TAKE 1 TABLET (40 MG TOTAL) BY MOUTH DAILY.  Marland Kitchen sulfaSALAzine (AZULFIDINE) 500 MG tablet Take 1,000 mg by mouth 3 (three) times daily.  . timolol (TIMOPTIC) 0.5 % ophthalmic solution 1 drop 2 (two) times daily.   Marland Kitchen triamcinolone ointment (KENALOG) 0.5 % Apply 1 application topically 2 (two) times daily.  . vitamin E (VITAMIN E) 400 UNIT capsule Take 400 Units by mouth daily.   No facility-administered encounter medications on file as of 11/27/2015.    Review of Systems  Constitutional: Negative for appetite change and unexpected weight change.  HENT: Negative for congestion and sinus pressure.   Respiratory: Negative for cough, chest tightness and shortness of breath.   Cardiovascular: Negative for chest  pain and palpitations.  Gastrointestinal: Negative for nausea, abdominal pain and diarrhea.  Genitourinary: Negative for dysuria and difficulty urinating.  Musculoskeletal: Negative for myalgias.       Persistent knee pain and swelling.    Skin: Negative for color change and rash.  Neurological: Negative for dizziness, light-headedness and headaches.  Psychiatric/Behavioral: Negative for dysphoric mood and agitation.       Objective:    Physical Exam  Constitutional: She appears well-developed and well-nourished. No distress.  HENT:  Nose: Nose normal.  Mouth/Throat: Oropharynx is clear and moist.  Neck: Neck supple. No thyromegaly present.  Cardiovascular: Normal rate and regular rhythm.   Pulmonary/Chest: Breath sounds normal. No respiratory distress. She has  no wheezes.  Abdominal: Soft. Bowel sounds are normal. There is no tenderness.  Musculoskeletal: She exhibits no edema or tenderness.  Increased soft tissue swelling - knee.    Lymphadenopathy:    She has no cervical adenopathy.  Skin: No rash noted. No erythema.  Psychiatric: She has a normal mood and affect. Her behavior is normal.    BP 138/80 mmHg  Pulse 56  Temp(Src) 97.5 F (36.4 C) (Oral)  Resp 18  Ht 5' 4.5" (1.638 m)  Wt 150 lb 6 oz (68.21 kg)  BMI 25.42 kg/m2  SpO2 96%  LMP 08/13/1995 Wt Readings from Last 3 Encounters:  11/27/15 150 lb 6 oz (68.21 kg)  08/15/15 151 lb 8 oz (68.72 kg)  07/20/15 152 lb 6.4 oz (69.128 kg)     Lab Results  Component Value Date   WBC 7.5 06/05/2014   HGB 12.4 06/05/2014   HCT 0* 06/05/2014   PLT 177 06/05/2014   GLUCOSE 90 07/30/2015   CHOL 215* 07/30/2015   TRIG 101.0 07/30/2015   HDL 49.70 07/30/2015   LDLDIRECT 132.5 05/06/2013   LDLCALC 145* 07/30/2015   ALT 10 07/30/2015   AST 17 07/30/2015   NA 144 07/30/2015   K 4.3 07/30/2015   CL 109 07/30/2015   CREATININE 0.77 07/30/2015   BUN 16 07/30/2015   CO2 27 07/30/2015   TSH 1.53 09/29/2014   HGBA1C 5.7 10/19/2012    Mm Digital Screening Bilateral  04/24/2015  CLINICAL DATA:  Screening. EXAM: DIGITAL SCREENING BILATERAL MAMMOGRAM WITH CAD COMPARISON:  Previous exam(s). ACR Breast Density Category c: The breast tissue is heterogeneously dense, which may obscure small masses. FINDINGS: There are no findings suspicious for malignancy. Images were processed with CAD. IMPRESSION: No mammographic evidence of malignancy. A result letter of this screening mammogram will be mailed directly to the patient. RECOMMENDATION: Screening mammogram in one year. (Code:SM-B-01Y) BI-RADS CATEGORY  1: Negative. Electronically Signed   By: Everlean Alstrom M.D.   On: 04/24/2015 10:53       Assessment & Plan:   Problem List Items Addressed This Visit    None       Einar Pheasant,  MD

## 2015-11-29 DIAGNOSIS — H401132 Primary open-angle glaucoma, bilateral, moderate stage: Secondary | ICD-10-CM | POA: Diagnosis not present

## 2015-12-03 ENCOUNTER — Encounter: Payer: Self-pay | Admitting: Internal Medicine

## 2015-12-03 NOTE — Assessment & Plan Note (Signed)
Followed by Dr Redmond Pulling.  Planning for f/u colonoscopy in 02/2016.  Bowels doing well.  No abdominal pain.

## 2015-12-03 NOTE — Assessment & Plan Note (Signed)
Blood pressure has been doing well.  Same medication regimen.  Follow pressures.  Follow metabolic panel.

## 2015-12-03 NOTE — Assessment & Plan Note (Signed)
Persistent knee pain and swelling as outlined.  Planning to be seen at nerve specialist clinic 12/11/15.  Followed by ortho.

## 2015-12-03 NOTE — Assessment & Plan Note (Signed)
Low cholesterol diet and exercise.  Follow lipid panel.

## 2015-12-10 ENCOUNTER — Other Ambulatory Visit (INDEPENDENT_AMBULATORY_CARE_PROVIDER_SITE_OTHER): Payer: Medicare Other

## 2015-12-10 DIAGNOSIS — K5010 Crohn's disease of large intestine without complications: Secondary | ICD-10-CM

## 2015-12-10 DIAGNOSIS — E7800 Pure hypercholesterolemia, unspecified: Secondary | ICD-10-CM

## 2015-12-10 DIAGNOSIS — I10 Essential (primary) hypertension: Secondary | ICD-10-CM | POA: Diagnosis not present

## 2015-12-10 LAB — CBC WITH DIFFERENTIAL/PLATELET
Basophils Absolute: 0 10*3/uL (ref 0.0–0.1)
Basophils Relative: 0.3 % (ref 0.0–3.0)
Eosinophils Absolute: 0.1 10*3/uL (ref 0.0–0.7)
Eosinophils Relative: 1.7 % (ref 0.0–5.0)
HCT: 35.6 % — ABNORMAL LOW (ref 36.0–46.0)
Hemoglobin: 11.9 g/dL — ABNORMAL LOW (ref 12.0–15.0)
Lymphocytes Relative: 24.9 % (ref 12.0–46.0)
Lymphs Abs: 1.8 10*3/uL (ref 0.7–4.0)
MCHC: 33.3 g/dL (ref 30.0–36.0)
MCV: 91.6 fl (ref 78.0–100.0)
Monocytes Absolute: 0.5 10*3/uL (ref 0.1–1.0)
Monocytes Relative: 7.4 % (ref 3.0–12.0)
Neutro Abs: 4.8 10*3/uL (ref 1.4–7.7)
Neutrophils Relative %: 65.7 % (ref 43.0–77.0)
Platelets: 216 10*3/uL (ref 150.0–400.0)
RBC: 3.89 Mil/uL (ref 3.87–5.11)
RDW: 15.5 % (ref 11.5–15.5)
WBC: 7.3 10*3/uL (ref 4.0–10.5)

## 2015-12-10 LAB — BASIC METABOLIC PANEL
BUN: 18 mg/dL (ref 6–23)
CO2: 27 mEq/L (ref 19–32)
Calcium: 9.5 mg/dL (ref 8.4–10.5)
Chloride: 107 mEq/L (ref 96–112)
Creatinine, Ser: 0.8 mg/dL (ref 0.40–1.20)
GFR: 91.18 mL/min (ref >60.00)
Glucose, Bld: 99 mg/dL (ref 70–99)
Potassium: 4 mEq/L (ref 3.5–5.1)
Sodium: 141 mEq/L (ref 135–145)

## 2015-12-10 LAB — HEPATIC FUNCTION PANEL
ALT: 11 U/L (ref 0–35)
AST: 19 U/L (ref 0–37)
Albumin: 4.1 g/dL (ref 3.5–5.2)
Alkaline Phosphatase: 83 U/L (ref 39–117)
Bilirubin, Direct: 0 mg/dL (ref 0.0–0.3)
Total Bilirubin: 0.4 mg/dL (ref 0.2–1.2)
Total Protein: 7.1 g/dL (ref 6.0–8.3)

## 2015-12-10 LAB — LIPID PANEL
Cholesterol: 197 mg/dL (ref 0–200)
HDL: 51.2 mg/dL (ref >39.00)
LDL Cholesterol: 124 mg/dL — ABNORMAL HIGH (ref 0–99)
NonHDL: 145.85
Total CHOL/HDL Ratio: 4
Triglycerides: 111 mg/dL (ref 0.0–149.0)
VLDL: 22.2 mg/dL (ref 0.0–40.0)

## 2015-12-10 LAB — TSH: TSH: 1.98 u[IU]/mL (ref 0.35–4.50)

## 2015-12-11 ENCOUNTER — Other Ambulatory Visit: Payer: Self-pay | Admitting: Internal Medicine

## 2015-12-11 DIAGNOSIS — G9050 Complex regional pain syndrome I, unspecified: Secondary | ICD-10-CM | POA: Diagnosis not present

## 2015-12-11 DIAGNOSIS — M1711 Unilateral primary osteoarthritis, right knee: Secondary | ICD-10-CM | POA: Diagnosis not present

## 2015-12-11 DIAGNOSIS — M25561 Pain in right knee: Secondary | ICD-10-CM | POA: Diagnosis not present

## 2015-12-11 DIAGNOSIS — M79604 Pain in right leg: Secondary | ICD-10-CM | POA: Diagnosis not present

## 2015-12-11 DIAGNOSIS — D649 Anemia, unspecified: Secondary | ICD-10-CM

## 2015-12-11 DIAGNOSIS — Z79891 Long term (current) use of opiate analgesic: Secondary | ICD-10-CM | POA: Diagnosis not present

## 2015-12-11 NOTE — Progress Notes (Signed)
Order placed for f/u labs.  

## 2015-12-12 DIAGNOSIS — M1711 Unilateral primary osteoarthritis, right knee: Secondary | ICD-10-CM | POA: Diagnosis not present

## 2015-12-17 DIAGNOSIS — E538 Deficiency of other specified B group vitamins: Secondary | ICD-10-CM | POA: Insufficient documentation

## 2015-12-26 DIAGNOSIS — T84032D Mechanical loosening of internal right knee prosthetic joint, subsequent encounter: Secondary | ICD-10-CM | POA: Diagnosis not present

## 2016-01-02 DIAGNOSIS — M25561 Pain in right knee: Secondary | ICD-10-CM | POA: Diagnosis not present

## 2016-01-08 ENCOUNTER — Other Ambulatory Visit (INDEPENDENT_AMBULATORY_CARE_PROVIDER_SITE_OTHER): Payer: Medicare Other

## 2016-01-08 DIAGNOSIS — D649 Anemia, unspecified: Secondary | ICD-10-CM | POA: Diagnosis not present

## 2016-01-08 LAB — CBC WITH DIFFERENTIAL/PLATELET
Basophils Absolute: 0 10*3/uL (ref 0.0–0.1)
Basophils Relative: 0.3 % (ref 0.0–3.0)
Eosinophils Absolute: 0 10*3/uL (ref 0.0–0.7)
Eosinophils Relative: 0.1 % (ref 0.0–5.0)
HCT: 36.6 % (ref 36.0–46.0)
Hemoglobin: 12.1 g/dL (ref 12.0–15.0)
Lymphocytes Relative: 17.9 % (ref 12.0–46.0)
Lymphs Abs: 2.1 10*3/uL (ref 0.7–4.0)
MCHC: 33 g/dL (ref 30.0–36.0)
MCV: 93 fl (ref 78.0–100.0)
Monocytes Absolute: 0.8 10*3/uL (ref 0.1–1.0)
Monocytes Relative: 6.9 % (ref 3.0–12.0)
Neutro Abs: 8.7 10*3/uL — ABNORMAL HIGH (ref 1.4–7.7)
Neutrophils Relative %: 74.8 % (ref 43.0–77.0)
Platelets: 228 10*3/uL (ref 150.0–400.0)
RBC: 3.94 Mil/uL (ref 3.87–5.11)
RDW: 16 % — ABNORMAL HIGH (ref 11.5–15.5)
WBC: 11.7 10*3/uL — ABNORMAL HIGH (ref 4.0–10.5)

## 2016-01-08 LAB — FERRITIN: Ferritin: 59.1 ng/mL (ref 10.0–291.0)

## 2016-01-08 LAB — IBC PANEL
Iron: 52 ug/dL (ref 42–145)
Saturation Ratios: 18.9 % — ABNORMAL LOW (ref 20.0–50.0)
Transferrin: 197 mg/dL — ABNORMAL LOW (ref 212.0–360.0)

## 2016-01-08 LAB — VITAMIN B12: Vitamin B-12: 163 pg/mL — ABNORMAL LOW (ref 211–911)

## 2016-01-09 ENCOUNTER — Other Ambulatory Visit: Payer: Self-pay | Admitting: Internal Medicine

## 2016-01-09 DIAGNOSIS — D72829 Elevated white blood cell count, unspecified: Secondary | ICD-10-CM

## 2016-01-09 NOTE — Progress Notes (Signed)
Order placed for f/u cbc.

## 2016-01-10 ENCOUNTER — Telehealth: Payer: Self-pay | Admitting: Internal Medicine

## 2016-01-10 DIAGNOSIS — G894 Chronic pain syndrome: Secondary | ICD-10-CM | POA: Diagnosis not present

## 2016-01-10 DIAGNOSIS — M25561 Pain in right knee: Secondary | ICD-10-CM | POA: Diagnosis not present

## 2016-01-10 NOTE — Telephone Encounter (Signed)
Spoke with patient and gave her lab results. She will schedule appointment for B12 shot

## 2016-01-10 NOTE — Telephone Encounter (Signed)
Pt called back returning call from yesterday regarding lab results.   Call pt @ [phone]. Thank you!

## 2016-01-15 ENCOUNTER — Ambulatory Visit (INDEPENDENT_AMBULATORY_CARE_PROVIDER_SITE_OTHER): Payer: Medicare Other | Admitting: *Deleted

## 2016-01-15 ENCOUNTER — Telehealth: Payer: Self-pay | Admitting: Internal Medicine

## 2016-01-15 ENCOUNTER — Ambulatory Visit: Payer: Medicare Other

## 2016-01-15 DIAGNOSIS — E538 Deficiency of other specified B group vitamins: Secondary | ICD-10-CM | POA: Diagnosis not present

## 2016-01-15 MED ORDER — CYANOCOBALAMIN 1000 MCG/ML IJ SOLN: 1000.0000 ug | Freq: Once | INTRAMUSCULAR | Status: DC

## 2016-01-15 MED ORDER — CYANOCOBALAMIN 1000 MCG/ML IJ SOLN
1000.0000 ug | Freq: Once | INTRAMUSCULAR | Status: AC
  Administered 2016-01-15: 1000 ug via INTRAMUSCULAR

## 2016-01-15 NOTE — Progress Notes (Signed)
Patient presented for first of 4 weekly B 12 injections to left deltoid to alternate to right at next visit.Patient tolerated well.

## 2016-01-15 NOTE — Telephone Encounter (Signed)
Pt came in and dropped off a form to be filled out from the [facility] Department of [employer].. Please mail to address on front of letter.. Please advise pt with any question.. Placed in Dr. Lars Mage box

## 2016-01-21 NOTE — Telephone Encounter (Signed)
Form completed and placed in your box.

## 2016-01-22 ENCOUNTER — Ambulatory Visit (INDEPENDENT_AMBULATORY_CARE_PROVIDER_SITE_OTHER): Payer: Medicare Other

## 2016-01-22 DIAGNOSIS — E538 Deficiency of other specified B group vitamins: Secondary | ICD-10-CM | POA: Diagnosis not present

## 2016-01-22 MED ORDER — CYANOCOBALAMIN 1000 MCG/ML IJ SOLN
1000.0000 ug | Freq: Once | INTRAMUSCULAR | Status: AC
  Administered 2016-01-22: 1000 ug via INTRAMUSCULAR

## 2016-01-22 NOTE — Telephone Encounter (Signed)
Pt was here this morning & I didn't catch her in time to hand her the forms. I left a voicemail on home number to let her know that Dr. Bary Leriche portion is complete but Ortho & eye doctor has parts to complete also. Need to know is she wants to come pick up form, have it mailed, or faxed.

## 2016-01-22 NOTE — Progress Notes (Signed)
Patient was in receiving a B12 injection in the right deltoid. patient tolerated well.

## 2016-01-23 ENCOUNTER — Ambulatory Visit: Payer: Medicare Other

## 2016-01-23 NOTE — Telephone Encounter (Signed)
Pt called back on 6/3 & asked me to go ahead & fax it to the number on the form. She had also given a copy to her eye doctor to complete their portion so it was ready to fax. Form faxed & pt aware

## 2016-01-29 ENCOUNTER — Ambulatory Visit (INDEPENDENT_AMBULATORY_CARE_PROVIDER_SITE_OTHER): Payer: Medicare Other

## 2016-01-29 DIAGNOSIS — E538 Deficiency of other specified B group vitamins: Secondary | ICD-10-CM | POA: Diagnosis not present

## 2016-01-29 MED ORDER — CYANOCOBALAMIN 1000 MCG/ML IJ SOLN
1000.0000 ug | Freq: Once | INTRAMUSCULAR | Status: AC
  Administered 2016-01-29: 1000 ug via INTRAMUSCULAR

## 2016-01-29 NOTE — Progress Notes (Signed)
patient was in today receiving a B12 injection in the left deltoid. Patient tolerated well.

## 2016-02-05 ENCOUNTER — Ambulatory Visit (INDEPENDENT_AMBULATORY_CARE_PROVIDER_SITE_OTHER): Payer: Medicare Other

## 2016-02-05 DIAGNOSIS — E538 Deficiency of other specified B group vitamins: Secondary | ICD-10-CM | POA: Diagnosis not present

## 2016-02-05 MED ORDER — CYANOCOBALAMIN 1000 MCG/ML IJ SOLN
1000.0000 ug | Freq: Once | INTRAMUSCULAR | Status: AC
  Administered 2016-02-05: 1000 ug via INTRAMUSCULAR

## 2016-02-05 NOTE — Progress Notes (Signed)
Patient came in for B12 injection.  Received in Right deltoid.  Patient tolerated well.

## 2016-02-18 DIAGNOSIS — G894 Chronic pain syndrome: Secondary | ICD-10-CM | POA: Diagnosis not present

## 2016-02-18 DIAGNOSIS — M25561 Pain in right knee: Secondary | ICD-10-CM | POA: Diagnosis not present

## 2016-02-27 DIAGNOSIS — M25561 Pain in right knee: Secondary | ICD-10-CM | POA: Diagnosis not present

## 2016-04-02 DIAGNOSIS — G9050 Complex regional pain syndrome I, unspecified: Secondary | ICD-10-CM | POA: Diagnosis not present

## 2016-04-15 DIAGNOSIS — D123 Benign neoplasm of transverse colon: Secondary | ICD-10-CM | POA: Diagnosis not present

## 2016-04-15 DIAGNOSIS — K635 Polyp of colon: Secondary | ICD-10-CM | POA: Diagnosis not present

## 2016-04-15 DIAGNOSIS — Z9049 Acquired absence of other specified parts of digestive tract: Secondary | ICD-10-CM | POA: Diagnosis not present

## 2016-04-15 DIAGNOSIS — K644 Residual hemorrhoidal skin tags: Secondary | ICD-10-CM | POA: Diagnosis not present

## 2016-04-15 DIAGNOSIS — K5730 Diverticulosis of large intestine without perforation or abscess without bleeding: Secondary | ICD-10-CM | POA: Diagnosis not present

## 2016-04-15 DIAGNOSIS — D120 Benign neoplasm of cecum: Secondary | ICD-10-CM | POA: Diagnosis not present

## 2016-04-15 DIAGNOSIS — K5010 Crohn's disease of large intestine without complications: Secondary | ICD-10-CM | POA: Diagnosis not present

## 2016-04-15 DIAGNOSIS — Z980 Intestinal bypass and anastomosis status: Secondary | ICD-10-CM | POA: Diagnosis not present

## 2016-04-15 DIAGNOSIS — K648 Other hemorrhoids: Secondary | ICD-10-CM | POA: Diagnosis not present

## 2016-04-15 DIAGNOSIS — D126 Benign neoplasm of colon, unspecified: Secondary | ICD-10-CM | POA: Diagnosis not present

## 2016-04-15 HISTORY — PX: COLONOSCOPY W/ BIOPSIES: SHX1374

## 2016-04-28 ENCOUNTER — Encounter: Payer: Self-pay | Admitting: Internal Medicine

## 2016-04-28 ENCOUNTER — Ambulatory Visit (INDEPENDENT_AMBULATORY_CARE_PROVIDER_SITE_OTHER): Payer: Medicare Other | Admitting: Internal Medicine

## 2016-04-28 VITALS — BP 100/60 | HR 66 | Temp 97.5°F | Ht 65.0 in | Wt 148.6 lb

## 2016-04-28 DIAGNOSIS — Z0000 Encounter for general adult medical examination without abnormal findings: Secondary | ICD-10-CM

## 2016-04-28 DIAGNOSIS — I10 Essential (primary) hypertension: Secondary | ICD-10-CM | POA: Diagnosis not present

## 2016-04-28 DIAGNOSIS — M25569 Pain in unspecified knee: Secondary | ICD-10-CM

## 2016-04-28 DIAGNOSIS — K5010 Crohn's disease of large intestine without complications: Secondary | ICD-10-CM

## 2016-04-28 DIAGNOSIS — E7800 Pure hypercholesterolemia, unspecified: Secondary | ICD-10-CM | POA: Diagnosis not present

## 2016-04-28 DIAGNOSIS — Z1239 Encounter for other screening for malignant neoplasm of breast: Secondary | ICD-10-CM | POA: Diagnosis not present

## 2016-04-28 DIAGNOSIS — B49 Unspecified mycosis: Secondary | ICD-10-CM

## 2016-04-28 NOTE — Progress Notes (Signed)
Pre visit review using our clinic review tool, if applicable. No additional management support is needed unless otherwise documented below in the visit note. 

## 2016-04-28 NOTE — Progress Notes (Signed)
Patient ID: Ellen Drake, female   DOB: [birth year], 70 y.o.   MRN: [id number]   Subjective:    Patient ID: Ellen Drake, female    DOB: Dec 29, 1945, 70 y.o.   MRN: [id number]  HPI  Patient here for a physical exam.  She has had persistent problems with her right knee.  She has had multiple surgeries.  Recently evaluated by Dr Gerrit Heck and Dr Angola.  Felt to have complex regional pain syndrome.  Recommended spinal cord neuromodulator.  Discussion about medication changes.  Has f/u planned tomorrow.  Knee pain is her biggest complaint.  States otherwise doing well.  Eating and drinking well.  No nausea or vomiting.  Bowels stable.  No abdominal pain or cramping.  No chest pain.  Breathing stable.     Past Medical History:  Diagnosis Date  . Crohn's disease ([place])   . Diverticulosis   . Fibrocystic breast disease   . Hypercholesterolemia   . Hypertension    Past Surgical History:  Procedure Laterality Date  . anterior cervical discectomy and fusion  1998   Dr Rolin Barry  . BACK SURGERY  1992   ruptured disc (Dr Mauri Pole)  . BREAST BIOPSY Left    neg-no scar seen  . KNEE SURGERY Right 1/16   [facility] (Dr. Sherlynn Carbon)   Family History  Problem Relation Age of Onset  . CVA Mother   . Hypertension Mother   . Hypertension Sister   . Breast cancer Neg Hx   . Colon cancer Neg Hx    Social History   Social History  . Marital status: Married    Spouse name: N/A  . Number of children: 0  . Years of education: N/A   Social History Main Topics  . Smoking status: Former Smoker    Quit date: 08/11/2008  . Smokeless tobacco: Never Used  . Alcohol use 0.0 oz/week     Comment: wine occasional  . Drug use: No  . Sexual activity: Yes   Other Topics Concern  . None   Social History Narrative  . None    Outpatient Encounter Prescriptions as of 04/28/2016  Medication Sig  . aspirin 81 MG tablet Take 81 mg by mouth daily.  Marland Kitchen atenolol (TENORMIN) 50 MG tablet TAKE 1 TABLET (50 MG  TOTAL) BY MOUTH DAILY.  . brimonidine (ALPHAGAN) 0.2 % ophthalmic solution 1 drop 3 (three) times daily.  Marland Kitchen HYDROcodone-acetaminophen (NORCO/VICODIN) 5-325 MG per tablet Take 1 tablet by mouth every 6 (six) hours as needed. for pain  . latanoprost (XALATAN) 0.005 % ophthalmic solution 1 drop at bedtime.  Marland Kitchen lisinopril (PRINIVIL,ZESTRIL) 40 MG tablet TAKE 1 TABLET (40 MG TOTAL) BY MOUTH DAILY.  Marland Kitchen sulfaSALAzine (AZULFIDINE) 500 MG tablet Take 1,000 mg by mouth 3 (three) times daily.  . timolol (TIMOPTIC) 0.5 % ophthalmic solution 1 drop 2 (two) times daily.   Marland Kitchen triamcinolone ointment (KENALOG) 0.5 % Apply 1 application topically 2 (two) times daily.  . vitamin E (VITAMIN E) 400 UNIT capsule Take 400 Units by mouth daily.   No facility-administered encounter medications on file as of 04/28/2016.     Review of Systems  Constitutional: Negative for appetite change and unexpected weight change.  HENT: Negative for congestion and sinus pressure.   Eyes: Negative for pain and visual disturbance.  Respiratory: Negative for cough, chest tightness and shortness of breath.   Cardiovascular: Negative for chest pain, palpitations and leg swelling.  Gastrointestinal: Negative for abdominal pain, diarrhea, nausea and vomiting.  Genitourinary: Negative for difficulty urinating and dysuria.  Musculoskeletal: Negative for back pain.       Persistent right knee pain as outlined.    Skin: Negative for color change and rash.  Neurological: Negative for dizziness, light-headedness and headaches.  Hematological: Negative for adenopathy. Does not bruise/bleed easily.  Psychiatric/Behavioral: Negative for agitation and dysphoric mood.       Objective:    Physical Exam  Constitutional: She is oriented to person, place, and time. She appears well-developed and well-nourished. No distress.  HENT:  Nose: Nose normal.  Mouth/Throat: Oropharynx is clear and moist.  Eyes: Right eye exhibits no discharge. Left eye  exhibits no discharge. No scleral icterus.  Neck: Neck supple. No thyromegaly present.  Cardiovascular: Normal rate and regular rhythm.   Pulmonary/Chest: Breath sounds normal. No accessory muscle usage. No tachypnea. No respiratory distress. She has no decreased breath sounds. She has no wheezes. She has no rhonchi. Right breast exhibits no inverted nipple, no mass, no nipple discharge and no tenderness (no axillary adenopathy). Left breast exhibits no inverted nipple, no mass, no nipple discharge and no tenderness (no axilarry adenopathy).  Abdominal: Soft. Bowel sounds are normal. There is no tenderness.  Musculoskeletal: She exhibits no edema or tenderness.  Lymphadenopathy:    She has no cervical adenopathy.  Neurological: She is alert and oriented to person, place, and time.  Skin: Skin is warm. No rash noted. No erythema.  Slight erythema between fourth and fifth toes.  Appears to be c/w fungus.    Psychiatric: She has a normal mood and affect. Her behavior is normal.    BP 100/60   Pulse 66   Temp 97.5 F (36.4 C) (Oral)   Ht 5' 5"  (1.651 m)   Wt 148 lb 9.6 oz (67.4 kg)   LMP 08/13/1995   SpO2 97%   BMI 24.73 kg/m  Wt Readings from Last 3 Encounters:  04/28/16 148 lb 9.6 oz (67.4 kg)  11/27/15 150 lb 6 oz (68.2 kg)  08/15/15 151 lb 8 oz (68.7 kg)     Lab Results  Component Value Date   WBC 11.7 (H) 01/08/2016   HGB 12.1 01/08/2016   HCT 36.6 01/08/2016   PLT 228.0 01/08/2016   GLUCOSE 99 12/10/2015   CHOL 197 12/10/2015   TRIG 111.0 12/10/2015   HDL 51.20 12/10/2015   LDLDIRECT 132.5 05/06/2013   LDLCALC 124 (H) 12/10/2015   ALT 11 12/10/2015   AST 19 12/10/2015   NA 141 12/10/2015   K 4.0 12/10/2015   CL 107 12/10/2015   CREATININE 0.80 12/10/2015   BUN 18 12/10/2015   CO2 27 12/10/2015   TSH 1.98 12/10/2015   HGBA1C 5.7 10/19/2012    Mm Digital Screening Bilateral  Result Date: 04/24/2015 CLINICAL DATA:  Screening. EXAM: DIGITAL SCREENING BILATERAL  MAMMOGRAM WITH CAD COMPARISON:  Previous exam(s). ACR Breast Density Category c: The breast tissue is heterogeneously dense, which may obscure small masses. FINDINGS: There are no findings suspicious for malignancy. Images were processed with CAD. IMPRESSION: No mammographic evidence of malignancy. A result letter of this screening mammogram will be mailed directly to the patient. RECOMMENDATION: Screening mammogram in one year. (Code:SM-B-01Y) BI-RADS CATEGORY  1: Negative. Electronically Signed   By: Everlean Alstrom M.D.   On: 04/24/2015 10:53       Assessment & Plan:   Problem List Items Addressed This Visit    Crohn's disease ([place])    Followed by GI.  Just had colonoscopy.  Relevant Orders   CBC with Differential/Platelet   Health care maintenance    Physical today 04/28/16.  PAP 08/22/12.  Colonoscopy 04/15/16.        Hypercholesterolemia    Low cholesterol diet and exercise.  Follow lipid panel.       Relevant Orders   Lipid panel   Hepatic function panel   Hypertension    Blood pressure under good control.  Continue same medication regimen.  Follow pressures.  Follow metabolic panel.        Relevant Orders   Basic metabolic panel   Knee pain    Persistent.  Seeing ortho.  Has f/u tomorrow.        Other Visit Diagnoses    Routine general medical examination at a health care facility    -  Primary   Screening breast examination       Relevant Orders   MM Digital Screening   Fungus infection       Appears to have fungal infection between 4th and 5th toe.  Lotrimin.  Follow.  Notify me if persistent.         Einar Pheasant, MD

## 2016-04-28 NOTE — Assessment & Plan Note (Addendum)
Physical today 04/28/16.  PAP 08/22/12.  Colonoscopy 04/15/16.

## 2016-04-28 NOTE — Patient Instructions (Signed)
Lotrimin cream - apply to affected area between toes twice a day.

## 2016-04-29 DIAGNOSIS — M179 Osteoarthritis of knee, unspecified: Secondary | ICD-10-CM | POA: Diagnosis not present

## 2016-04-29 DIAGNOSIS — M064 Inflammatory polyarthropathy: Secondary | ICD-10-CM | POA: Diagnosis not present

## 2016-04-29 DIAGNOSIS — G894 Chronic pain syndrome: Secondary | ICD-10-CM | POA: Diagnosis not present

## 2016-05-03 ENCOUNTER — Encounter: Payer: Self-pay | Admitting: Internal Medicine

## 2016-05-03 NOTE — Assessment & Plan Note (Addendum)
Followed by GI.  Just had colonoscopy.

## 2016-05-03 NOTE — Assessment & Plan Note (Signed)
Low cholesterol diet and exercise.  Follow lipid panel.

## 2016-05-03 NOTE — Assessment & Plan Note (Signed)
Blood pressure under good control.  Continue same medication regimen.  Follow pressures.  Follow metabolic panel.   

## 2016-05-03 NOTE — Assessment & Plan Note (Signed)
Persistent.  Seeing ortho.  Has f/u tomorrow.

## 2016-05-05 ENCOUNTER — Other Ambulatory Visit: Payer: Self-pay | Admitting: Internal Medicine

## 2016-05-05 ENCOUNTER — Ambulatory Visit
Admission: RE | Admit: 2016-05-05 | Discharge: 2016-05-05 | Disposition: A | Discharge status: Home / Self Care | Payer: Medicare Other | Source: Ambulatory Visit | Attending: Internal Medicine | Admitting: Internal Medicine

## 2016-05-05 DIAGNOSIS — Z1231 Encounter for screening mammogram for malignant neoplasm of breast: Secondary | ICD-10-CM | POA: Insufficient documentation

## 2016-05-05 DIAGNOSIS — Z1239 Encounter for other screening for malignant neoplasm of breast: Secondary | ICD-10-CM

## 2016-05-05 LAB — HM MAMMOGRAPHY

## 2016-05-19 ENCOUNTER — Telehealth: Payer: Self-pay | Admitting: *Deleted

## 2016-05-19 NOTE — Telephone Encounter (Signed)
Pt will need to reschedule appt from 06/13/16 to another day.

## 2016-05-21 DIAGNOSIS — M25561 Pain in right knee: Secondary | ICD-10-CM | POA: Diagnosis not present

## 2016-05-21 DIAGNOSIS — T84032D Mechanical loosening of internal right knee prosthetic joint, subsequent encounter: Secondary | ICD-10-CM | POA: Diagnosis not present

## 2016-05-29 ENCOUNTER — Other Ambulatory Visit: Payer: Self-pay | Admitting: Internal Medicine

## 2016-05-29 DIAGNOSIS — H401132 Primary open-angle glaucoma, bilateral, moderate stage: Secondary | ICD-10-CM | POA: Diagnosis not present

## 2016-06-13 ENCOUNTER — Ambulatory Visit: Payer: Medicare Other | Admitting: Internal Medicine

## 2016-06-27 DIAGNOSIS — M25561 Pain in right knee: Secondary | ICD-10-CM | POA: Diagnosis not present

## 2016-07-01 NOTE — Progress Notes (Signed)
Subjective:    Patient ID: Ellen Drake, female    DOB: Aug 28, 1945, 70 y.o.   MRN: [id number]  CC: Ellen Drake is a 70 y.o. female who presents today for an acute visit.    HPI: CC: cough for past 6 days, unchanged.. Non bloody omiting episode 6 days ago, resolved. Day prior had steriod shot in right knee and believes that may have caused vomiting. No abdominal pain. No diarrhea.  OTC cough medication with some relief. Cough 'all day and night.' Endorses 'little wheeze', tactile fever, chills. Has had to use inhaler 'years ago.' No h/o PNA.   No sore throat, ear pain, sinus pressure, HA.   History of Crohn's, diverticulosis.    HISTORY:  Past Medical History:  Diagnosis Date  . Crohn's disease ([place])   . Diverticulosis   . Fibrocystic breast disease   . Hypercholesterolemia   . Hypertension    Past Surgical History:  Procedure Laterality Date  . anterior cervical discectomy and fusion  1998   Dr Rolin Barry  . BACK SURGERY  1992   ruptured disc (Dr Mauri Pole)  . BREAST BIOPSY Left    neg-no scar seen  . KNEE SURGERY Right 1/16   [facility] (Dr. Sherlynn Carbon)   Family History  Problem Relation Age of Onset  . CVA Mother   . Hypertension Mother   . Hypertension Sister   . Breast cancer Neg Hx   . Colon cancer Neg Hx     Allergies: Mercaptopurine; Lyrica [pregabalin]; and Dilaudid [hydromorphone hcl] Current Outpatient Prescriptions on File Prior to Visit  Medication Sig Dispense Refill  . aspirin 81 MG tablet Take 81 mg by mouth daily.    Marland Kitchen atenolol (TENORMIN) 50 MG tablet TAKE 1 TABLET (50 MG TOTAL) BY MOUTH DAILY. 90 tablet 3  . brimonidine (ALPHAGAN) 0.2 % ophthalmic solution 1 drop 3 (three) times daily.    Marland Kitchen HYDROcodone-acetaminophen (NORCO/VICODIN) 5-325 MG per tablet Take 1 tablet by mouth every 6 (six) hours as needed. for pain  0  . latanoprost (XALATAN) 0.005 % ophthalmic solution 1 drop at bedtime.    Marland Kitchen lisinopril (PRINIVIL,ZESTRIL) 40 MG tablet TAKE 1  TABLET (40 MG TOTAL) BY MOUTH DAILY. 90 tablet 3  . sulfaSALAzine (AZULFIDINE) 500 MG tablet Take 1,000 mg by mouth 3 (three) times daily.    . timolol (TIMOPTIC) 0.5 % ophthalmic solution 1 drop 2 (two) times daily.   5  . triamcinolone ointment (KENALOG) 0.5 % Apply 1 application topically 2 (two) times daily. 15 g 0  . vitamin E (VITAMIN E) 400 UNIT capsule Take 400 Units by mouth daily.     No current facility-administered medications on file prior to visit.     Social History  Substance Use Topics  . Smoking status: Former Smoker    Quit date: 08/11/2008  . Smokeless tobacco: Never Used  . Alcohol use 0.0 oz/week     Comment: wine occasional    Review of Systems  Constitutional: Negative for chills and fever.  HENT: Positive for congestion, rhinorrhea and sinus pressure. Negative for sore throat.   Respiratory: Positive for choking and wheezing. Negative for cough and shortness of breath.   Cardiovascular: Negative for chest pain and palpitations.  Gastrointestinal: Negative for abdominal pain, diarrhea, nausea and vomiting (resolved).      Objective:    BP 140/90   Pulse 63   Temp 98.4 F (36.9 C) (Oral)   Ht 5' 5"  (1.651 m)   Wt 148  lb 3.2 oz (67.2 kg)   LMP 08/13/1995   SpO2 95%   BMI 24.66 kg/m    Physical Exam  Constitutional: She appears well-developed and well-nourished.  HENT:  Head: Normocephalic and atraumatic.  Right Ear: Hearing, tympanic membrane, external ear and ear canal normal. No drainage, swelling or tenderness. No foreign bodies. Tympanic membrane is not erythematous and not bulging. No middle ear effusion. No decreased hearing is noted.  Left Ear: Hearing, tympanic membrane, external ear and ear canal normal. No drainage, swelling or tenderness. No foreign bodies. Tympanic membrane is not erythematous and not bulging.  No middle ear effusion. No decreased hearing is noted.  Nose: Nose normal. No rhinorrhea. Right sinus exhibits no maxillary sinus  tenderness and no frontal sinus tenderness. Left sinus exhibits no maxillary sinus tenderness and no frontal sinus tenderness.  Mouth/Throat: Uvula is midline, oropharynx is clear and moist and mucous membranes are normal. No oropharyngeal exudate, posterior oropharyngeal edema, posterior oropharyngeal erythema or tonsillar abscesses.  Eyes: Conjunctivae are normal.  Cardiovascular: Regular rhythm, normal heart sounds and normal pulses.   Pulmonary/Chest: Effort normal. She has wheezes. She has no rhonchi. She has no rales.  Few wheezing on exam.  Lymphadenopathy:       Head (right side): No submental, no submandibular, no tonsillar, no preauricular, no posterior auricular and no occipital adenopathy present.       Head (left side): No submental, no submandibular, no tonsillar, no preauricular, no posterior auricular and no occipital adenopathy present.    She has no cervical adenopathy.  Neurological: She is alert.  Skin: Skin is warm and dry.  Psychiatric: She has a normal mood and affect. Her speech is normal and behavior is normal. Thought content normal.  Vitals reviewed.      Assessment & Plan:  1. Acute bronchitis, unspecified organism Afebrile. SaO2 95%. No acute respiratory distress. Few wheezes heard on exam, encouraged when necessary albuterol use. Due to duration of symptoms, patient and I agreed to start antibiotic and when necessary cough medication.  - benzonatate (TESSALON) 100 MG capsule; Take 1 capsule (100 mg total) by mouth 2 (two) times daily as needed for cough.  Dispense: 20 capsule; Refill: 0 - azithromycin (ZITHROMAX) 250 MG tablet; Tale 500 mg PO on day 1, then 250 mg PO q24h x 4 days.  Dispense: 6 tablet; Refill: 0 - albuterol (PROVENTIL HFA) 108 (90 Base) MCG/ACT inhaler; Inhale 2 puffs into the lungs every 6 (six) hours as needed for wheezing or shortness of breath.  Dispense: 1 Inhaler; Refill: 1     I am having Ellen Drake start on benzonatate, azithromycin,  and albuterol. I am also having her maintain her aspirin, brimonidine, latanoprost, sulfaSALAzine, HYDROcodone-acetaminophen, timolol, triamcinolone ointment, vitamin E, lisinopril, and atenolol.   Meds ordered this encounter  Medications  . benzonatate (TESSALON) 100 MG capsule    Sig: Take 1 capsule (100 mg total) by mouth 2 (two) times daily as needed for cough.    Dispense:  20 capsule    Refill:  0    Order Specific Question:   Supervising Provider    Answer:   Deborra Medina L [[id number]]  . azithromycin (ZITHROMAX) 250 MG tablet    Sig: Tale 500 mg PO on day 1, then 250 mg PO q24h x 4 days.    Dispense:  6 tablet    Refill:  0    Order Specific Question:   Supervising Provider    Answer:   Crecencio Mc [  [id number]]  . albuterol (PROVENTIL HFA) 108 (90 Base) MCG/ACT inhaler    Sig: Inhale 2 puffs into the lungs every 6 (six) hours as needed for wheezing or shortness of breath.    Dispense:  1 Inhaler    Refill:  1    Order Specific Question:   Supervising Provider    Answer:   Crecencio Mc [[id number]]    Return precautions given.   Risks, benefits, and alternatives of the medications and treatment plan prescribed today were discussed, and patient expressed understanding.   Education regarding symptom management and diagnosis given to patient on AVS.  Continue to follow with Einar Pheasant, MD for routine health maintenance.   Ellen Drake and I agreed with plan.   Mable Paris, FNP

## 2016-07-02 ENCOUNTER — Encounter: Payer: Self-pay | Admitting: Family

## 2016-07-02 ENCOUNTER — Ambulatory Visit (INDEPENDENT_AMBULATORY_CARE_PROVIDER_SITE_OTHER): Payer: Medicare Other | Admitting: Family

## 2016-07-02 VITALS — BP 140/90 | HR 63 | Temp 98.4°F | Ht 65.0 in | Wt 148.2 lb

## 2016-07-02 DIAGNOSIS — J209 Acute bronchitis, unspecified: Secondary | ICD-10-CM | POA: Diagnosis not present

## 2016-07-02 MED ORDER — ALBUTEROL SULFATE HFA 108 (90 BASE) MCG/ACT IN AERS: 2.0000 | INHALATION_SPRAY | Freq: Four times a day (QID) | RESPIRATORY_TRACT | 1 refills | Status: DC | PRN

## 2016-07-02 MED ORDER — AZITHROMYCIN 250 MG PO TABS: ORAL_TABLET | ORAL | 0 refills | Status: DC

## 2016-07-02 MED ORDER — BENZONATATE 100 MG PO CAPS: 100.0000 mg | ORAL_CAPSULE | Freq: Two times a day (BID) | ORAL | 0 refills | Status: DC | PRN

## 2016-07-02 NOTE — Progress Notes (Signed)
Pre visit review using our clinic review tool, if applicable. No additional management support is needed unless otherwise documented below in the visit note. 

## 2016-07-02 NOTE — Patient Instructions (Signed)
Please take cough medication at night only as needed. As we discussed, I do not recommend dosing throughout the day as coughing is a protective mechanism . It also helps to break up thick mucous.  Do not take cough suppressants with alcohol as can lead to trouble breathing. Advise caution if taking cough suppressant and operating machinery ( i.e driving a car) as you may feel very tired.   Use albuterol every 6 hours for first 24 hours to get good medication into the lungs and loosen congestion; after, you may use as needed and eventually stop all together when cough resolves.  Increase intake of clear fluids. Congestion is best treated by hydration, when mucus is wetter, it is thinner, less sticky, and easier to expel from the body, either through coughing up drainage, or by blowing your nose.   Get plenty of rest.   Use saline nasal drops and blow your nose frequently. Run a humidifier at night and elevate the head of the bed. Vicks Vapor rub will help with congestion and cough. Steam showers and sinus massage for congestion.   Use Acetaminophen or Ibuprofen as needed for fever or pain. Avoid second hand smoke. Even the smallest exposure will worsen symptoms.   You can also try a teaspoon of honey to see if this will help reduce cough. Throat lozenges can sometimes be beneficial as well.    This illness will typically last 7 - 10 days.   Please follow up with our clinic if you develop a fever greater than 101 F, symptoms worsen, or do not resolve in the next week.

## 2016-07-14 ENCOUNTER — Encounter: Payer: Self-pay | Admitting: Internal Medicine

## 2016-07-14 ENCOUNTER — Ambulatory Visit (INDEPENDENT_AMBULATORY_CARE_PROVIDER_SITE_OTHER): Payer: Medicare Other | Admitting: Internal Medicine

## 2016-07-14 DIAGNOSIS — Z23 Encounter for immunization: Secondary | ICD-10-CM

## 2016-07-14 DIAGNOSIS — K5010 Crohn's disease of large intestine without complications: Secondary | ICD-10-CM

## 2016-07-14 DIAGNOSIS — I10 Essential (primary) hypertension: Secondary | ICD-10-CM | POA: Diagnosis not present

## 2016-07-14 DIAGNOSIS — E7800 Pure hypercholesterolemia, unspecified: Secondary | ICD-10-CM | POA: Diagnosis not present

## 2016-07-14 DIAGNOSIS — M25561 Pain in right knee: Secondary | ICD-10-CM | POA: Diagnosis not present

## 2016-07-14 LAB — CBC WITH DIFFERENTIAL/PLATELET
Basophils Absolute: 0 10*3/uL (ref 0.0–0.1)
Basophils Relative: 0.2 % (ref 0.0–3.0)
Eosinophils Absolute: 0.1 10*3/uL (ref 0.0–0.7)
Eosinophils Relative: 0.8 % (ref 0.0–5.0)
HCT: 39.1 % (ref 36.0–46.0)
Hemoglobin: 13.1 g/dL (ref 12.0–15.0)
Lymphocytes Relative: 17.8 % (ref 12.0–46.0)
Lymphs Abs: 2.1 10*3/uL (ref 0.7–4.0)
MCHC: 33.6 g/dL (ref 30.0–36.0)
MCV: 93.9 fl (ref 78.0–100.0)
Monocytes Absolute: 0.7 10*3/uL (ref 0.1–1.0)
Monocytes Relative: 5.9 % (ref 3.0–12.0)
Neutro Abs: 8.8 10*3/uL — ABNORMAL HIGH (ref 1.4–7.7)
Neutrophils Relative %: 75.3 % (ref 43.0–77.0)
Platelets: 212 10*3/uL (ref 150.0–400.0)
RBC: 4.16 Mil/uL (ref 3.87–5.11)
RDW: 13.6 % (ref 11.5–15.5)
WBC: 11.7 10*3/uL — ABNORMAL HIGH (ref 4.0–10.5)

## 2016-07-14 LAB — BASIC METABOLIC PANEL
BUN: 14 mg/dL (ref 6–23)
CO2: 29 mEq/L (ref 19–32)
Calcium: 9.5 mg/dL (ref 8.4–10.5)
Chloride: 107 mEq/L (ref 96–112)
Creatinine, Ser: 0.84 mg/dL (ref 0.40–1.20)
GFR: 86.04 mL/min (ref >60.00)
Glucose, Bld: 108 mg/dL — ABNORMAL HIGH (ref 70–99)
Potassium: 4 mEq/L (ref 3.5–5.1)
Sodium: 142 mEq/L (ref 135–145)

## 2016-07-14 LAB — HEPATIC FUNCTION PANEL
ALT: 13 U/L (ref 0–35)
AST: 20 U/L (ref 0–37)
Albumin: 4.3 g/dL (ref 3.5–5.2)
Alkaline Phosphatase: 88 U/L (ref 39–117)
Bilirubin, Direct: 0.1 mg/dL (ref 0.0–0.3)
Total Bilirubin: 0.3 mg/dL (ref 0.2–1.2)
Total Protein: 7 g/dL (ref 6.0–8.3)

## 2016-07-14 LAB — LIPID PANEL
Cholesterol: 216 mg/dL — ABNORMAL HIGH (ref 0–200)
HDL: 58.9 mg/dL (ref >39.00)
LDL Cholesterol: 132 mg/dL — ABNORMAL HIGH (ref 0–99)
NonHDL: 157.26
Total CHOL/HDL Ratio: 4
Triglycerides: 126 mg/dL (ref 0.0–149.0)
VLDL: 25.2 mg/dL (ref 0.0–40.0)

## 2016-07-14 NOTE — Assessment & Plan Note (Signed)
Persistent with increased swelling.  S/p recent fluid aspiration.  Has f/u planned in January.

## 2016-07-14 NOTE — Assessment & Plan Note (Signed)
Blood pressure on recheck improved.  Continue same medication regimen.  Follow pressures.  Follow metabolic panel.   

## 2016-07-14 NOTE — Progress Notes (Signed)
Patient ID: Ellen Drake Renstrom, female   DOB: 1945/12/15, 70 y.o.   MRN: [id number]   Subjective:    Patient ID: Ellen Drake, female    DOB: 04-28-1946, 70 y.o.   MRN: [id number]  HPI  Patient here for a scheduled follow up.  She is doing well.  Feels good except for her knee.  Still having problems with her right knee.  Some increased swelling.  Had fluid drawn off recently.  Has f/u planned in January.  Still stays active.  Taking pain medication.  No chest pain.  No sob.  No acid reflux.  No abdominal pain or cramping.  Bowels moving and doing well.  Has f/u planned with GI next week.    Past Medical History:  Diagnosis Date  . Crohn's disease ([place])   . Diverticulosis   . Fibrocystic breast disease   . Hypercholesterolemia   . Hypertension    Past Surgical History:  Procedure Laterality Date  . anterior cervical discectomy and fusion  1998   Dr Rolin Barry  . BACK SURGERY  1992   ruptured disc (Dr Mauri Pole)  . BREAST BIOPSY Left    neg-no scar seen  . KNEE SURGERY Right 1/16   [facility] (Dr. Sherlynn Carbon)   Family History  Problem Relation Age of Onset  . CVA Mother   . Hypertension Mother   . Hypertension Sister   . Breast cancer Neg Hx   . Colon cancer Neg Hx    Social History   Social History  . Marital status: Married    Spouse name: N/A  . Number of children: 0  . Years of education: N/A   Social History Main Topics  . Smoking status: Former Smoker    Quit date: 08/11/2008  . Smokeless tobacco: Never Used  . Alcohol use 0.0 oz/week     Comment: wine occasional  . Drug use: No  . Sexual activity: Yes   Other Topics Concern  . None   Social History Narrative  . None    Outpatient Encounter Prescriptions as of 07/14/2016  Medication Sig  . albuterol (PROVENTIL HFA) 108 (90 Base) MCG/ACT inhaler Inhale 2 puffs into the lungs every 6 (six) hours as needed for wheezing or shortness of breath.  Marland Kitchen aspirin 81 MG tablet Take 81 mg by mouth daily.  Marland Kitchen atenolol  (TENORMIN) 50 MG tablet TAKE 1 TABLET (50 MG TOTAL) BY MOUTH DAILY.  . brimonidine (ALPHAGAN) 0.2 % ophthalmic solution 1 drop 3 (three) times daily.  . DULoxetine (CYMBALTA) 30 MG capsule Take 30 mg by mouth daily.  Marland Kitchen HYDROcodone-acetaminophen (NORCO/VICODIN) 5-325 MG per tablet Take 1 tablet by mouth every 6 (six) hours as needed. for pain  . latanoprost (XALATAN) 0.005 % ophthalmic solution 1 drop at bedtime.  Marland Kitchen lisinopril (PRINIVIL,ZESTRIL) 40 MG tablet TAKE 1 TABLET (40 MG TOTAL) BY MOUTH DAILY.  Marland Kitchen sulfaSALAzine (AZULFIDINE) 500 MG tablet Take 1,000 mg by mouth 3 (three) times daily.  . tapentadol (NUCYNTA ER) 50 MG 12 hr tablet Take 50 mg by mouth every 12 (twelve) hours.  . timolol (TIMOPTIC) 0.5 % ophthalmic solution 1 drop 2 (two) times daily.   Marland Kitchen triamcinolone ointment (KENALOG) 0.5 % Apply 1 application topically 2 (two) times daily.  . vitamin E (VITAMIN E) 400 UNIT capsule Take 400 Units by mouth daily.  . [DISCONTINUED] azithromycin (ZITHROMAX) 250 MG tablet Tale 500 mg PO on Drake 1, then 250 mg PO q24h x 4 days.  . [DISCONTINUED] benzonatate (TESSALON)  100 MG capsule Take 1 capsule (100 mg total) by mouth 2 (two) times daily as needed for cough.   No facility-administered encounter medications on file as of 07/14/2016.     Review of Systems  Constitutional: Negative for appetite change and unexpected weight change.  HENT: Negative for congestion and sinus pressure.   Respiratory: Negative for cough, chest tightness and shortness of breath.   Cardiovascular: Negative for chest pain, palpitations and leg swelling.  Gastrointestinal: Negative for abdominal pain, diarrhea, nausea and vomiting.  Musculoskeletal: Positive for joint swelling. Negative for back pain.       Right knee pain.   Skin: Negative for color change and rash.  Neurological: Negative for dizziness, light-headedness and headaches.  Psychiatric/Behavioral: Negative for agitation and dysphoric mood.         Objective:    Physical Exam  Constitutional: She appears well-developed and well-nourished. No distress.  HENT:  Nose: Nose normal.  Mouth/Throat: Oropharynx is clear and moist.  Neck: Neck supple. No thyromegaly present.  Cardiovascular: Normal rate and regular rhythm.   Pulmonary/Chest: Breath sounds normal. No respiratory distress. She has no wheezes.  Abdominal: Soft. Bowel sounds are normal. There is no tenderness.  Musculoskeletal: She exhibits no edema or tenderness.  Increased swelling of her right knee.    Lymphadenopathy:    She has no cervical adenopathy.  Skin: No rash noted. No erythema.  Psychiatric: She has a normal mood and affect. Her behavior is normal.    BP 138/82   Pulse (!) 55   Temp 97.7 F (36.5 C) (Oral)   Ht 5' 5"  (1.651 m)   Wt 150 lb (68 kg)   LMP 08/13/1995   SpO2 95%   BMI 24.96 kg/m  Wt Readings from Last 3 Encounters:  07/14/16 150 lb (68 kg)  07/02/16 148 lb 3.2 oz (67.2 kg)  04/28/16 148 lb 9.6 oz (67.4 kg)     Lab Results  Component Value Date   WBC 11.7 (H) 01/08/2016   HGB 12.1 01/08/2016   HCT 36.6 01/08/2016   PLT 228.0 01/08/2016   GLUCOSE 99 12/10/2015   CHOL 197 12/10/2015   TRIG 111.0 12/10/2015   HDL 51.20 12/10/2015   LDLDIRECT 132.5 05/06/2013   LDLCALC 124 (H) 12/10/2015   ALT 11 12/10/2015   AST 19 12/10/2015   NA 141 12/10/2015   K 4.0 12/10/2015   CL 107 12/10/2015   CREATININE 0.80 12/10/2015   BUN 18 12/10/2015   CO2 27 12/10/2015   TSH 1.98 12/10/2015   HGBA1C 5.7 10/19/2012    Mm Screening Breast Tomo Bilateral  Result Date: 05/05/2016 CLINICAL DATA:  Screening. EXAM: 2D DIGITAL SCREENING BILATERAL MAMMOGRAM WITH CAD AND ADJUNCT TOMO COMPARISON:  Previous exam(s). ACR Breast Density Category b: There are scattered areas of fibroglandular density. FINDINGS: There are no findings suspicious for malignancy. Images were processed with CAD. IMPRESSION: No mammographic evidence of malignancy. A result  letter of this screening mammogram will be mailed directly to the patient. RECOMMENDATION: Screening mammogram in one year. (Code:SM-B-01Y) BI-RADS CATEGORY  1: Negative. Electronically Signed   By: Marin Olp M.D.   On: 05/05/2016 12:50       Assessment & Plan:   Problem List Items Addressed This Visit    Crohn's disease ([place])    Followed by GI.  Currently doing well.  Up to date with colonoscopy.  Due f/u with GI next week.        Hypercholesterolemia    Low  cholesterol diet and exercise.  Follow lipid panel.        Hypertension    Blood pressure on recheck improved.  Continue same medication regimen.  Follow pressures.  Follow metabolic panel.        Knee pain    Persistent with increased swelling.  S/p recent fluid aspiration.  Has f/u planned in January.         Other Visit Diagnoses    Encounter for immunization       Relevant Orders   Flu vaccine HIGH DOSE PF (Completed)       Einar Pheasant, MD

## 2016-07-14 NOTE — Assessment & Plan Note (Signed)
Followed by GI.  Currently doing well.  Up to date with colonoscopy.  Due f/u with GI next week.

## 2016-07-14 NOTE — Progress Notes (Signed)
Pre visit review using our clinic review tool, if applicable. No additional management support is needed unless otherwise documented below in the visit note. 

## 2016-07-14 NOTE — Assessment & Plan Note (Signed)
Low cholesterol diet and exercise.  Follow lipid panel.

## 2016-07-15 ENCOUNTER — Encounter: Payer: Self-pay | Admitting: Internal Medicine

## 2016-07-21 ENCOUNTER — Ambulatory Visit (INDEPENDENT_AMBULATORY_CARE_PROVIDER_SITE_OTHER): Payer: Medicare Other

## 2016-07-21 VITALS — BP 138/70 | HR 63 | Temp 97.9°F | Resp 14 | Ht 65.0 in | Wt 148.6 lb

## 2016-07-21 DIAGNOSIS — Z0000 Encounter for general adult medical examination without abnormal findings: Secondary | ICD-10-CM | POA: Diagnosis not present

## 2016-07-21 DIAGNOSIS — Z23 Encounter for immunization: Secondary | ICD-10-CM

## 2016-07-21 NOTE — Patient Instructions (Addendum)
  Ms. Runions , Thank you for taking time to come for your Medicare Wellness Visit. I appreciate your ongoing commitment to your health goals. Please review the following plan we discussed and let me know if I can assist you in the future.   Follow up with Dr. Nicki Reaper as needed.  These are the goals we discussed: Goals    . Increase physical activity          Continue chair exercises, increase as tolerated.   Water exercises.       This is a list of the screening recommended for you and due dates:  Health Maintenance  Topic Date Due  .  Hepatitis C: One time screening is recommended by Center for Disease Control  (CDC) for  adults born from [birth year] through 1965.   [birth year]  . Shingles Vaccine  07/10/2017*  . Tetanus Vaccine  07/10/2017*  . Mammogram  05/05/2017  . Pneumonia vaccines (2 of 2 - PPSV23) 07/21/2017  . Colon Cancer Screening  12/01/2022  . Flu Shot  Completed  . DEXA scan (bone density measurement)  Completed  *Topic was postponed. The date shown is not the original due date.

## 2016-07-21 NOTE — Progress Notes (Signed)
Subjective:   Ellen Drake is a 70 y.o. female who presents for Medicare Annual (Subsequent) preventive examination.  Review of Systems:  No ROS.  Medicare Wellness Visit.  Cardiac Risk Factors include: advanced age (>6mn, >>41women);hypertension     Objective:     Vitals: BP 138/70 (BP Location: Right Arm, Patient Position: Sitting, Cuff Size: Normal)   Pulse 63   Temp 97.9 F (36.6 C) (Oral)   Resp 14   Ht 5' 5"  (1.651 m)   Wt 148 lb 9.6 oz (67.4 kg)   LMP 08/13/1995   SpO2 98%   BMI 24.73 kg/m   Body mass index is 24.73 kg/m.   Tobacco History  Smoking Status  . Former Smoker  . Quit date: 08/11/2008  Smokeless Tobacco  . Never Used     Counseling given: Not Answered   Past Medical History:  Diagnosis Date  . [place]'s disease ([place]   . Diverticulosis   . Fibrocystic breast disease   . Hypercholesterolemia   . Hypertension    Past Surgical History:  Procedure Laterality Date  . anterior cervical discectomy and fusion  1998   Dr DRolin Barry . BACK SURGERY  1992   ruptured disc (Dr CMauri Pole  . BREAST BIOPSY Left    neg-no scar seen  . KNEE SURGERY Right 1/16   [facility] (Dr. LSherlynn Carbon   Family History  Problem Relation Age of Onset  . CVA Mother   . Hypertension Mother   . Hypertension Sister   . Breast cancer Neg Hx   . Colon cancer Neg Hx    History  Sexual Activity  . Sexual activity: Yes    Outpatient Encounter Prescriptions as of 07/21/2016  Medication Sig  . albuterol (PROVENTIL HFA) 108 (90 Base) MCG/ACT inhaler Inhale 2 puffs into the lungs every 6 (six) hours as needed for wheezing or shortness of breath.  .Marland Kitchenaspirin 81 MG tablet Take 81 mg by mouth daily.  .Marland Kitchenatenolol (TENORMIN) 50 MG tablet TAKE 1 TABLET (50 MG TOTAL) BY MOUTH DAILY.  . brimonidine (ALPHAGAN) 0.2 % ophthalmic solution 1 drop 3 (three) times daily.  . DULoxetine (CYMBALTA) 30 MG capsule Take 30 mg by mouth daily.  .Marland KitchenHYDROcodone-acetaminophen (NORCO/VICODIN)  5-325 MG per tablet Take 1 tablet by mouth every 6 (six) hours as needed. for pain  . latanoprost (XALATAN) 0.005 % ophthalmic solution 1 drop at bedtime.  .Marland Kitchenlisinopril (PRINIVIL,ZESTRIL) 40 MG tablet TAKE 1 TABLET (40 MG TOTAL) BY MOUTH DAILY.  .Marland KitchensulfaSALAzine (AZULFIDINE) 500 MG tablet Take 1,000 mg by mouth 3 (three) times daily.  . tapentadol (NUCYNTA ER) 50 MG 12 hr tablet Take 50 mg by mouth every 12 (twelve) hours.  . timolol (TIMOPTIC) 0.5 % ophthalmic solution 1 drop 2 (two) times daily.   .Marland Kitchentriamcinolone ointment (KENALOG) 0.5 % Apply 1 application topically 2 (two) times daily.  . vitamin E (VITAMIN E) 400 UNIT capsule Take 400 Units by mouth daily.   No facility-administered encounter medications on file as of 07/21/2016.     Activities of Daily Living In your present state of health, do you have any difficulty performing the following activities: 07/21/2016  Hearing? N  Vision? N  Difficulty concentrating or making decisions? N  Walking or climbing stairs? Y  Dressing or bathing? N  Doing errands, shopping? N  Preparing Food and eating ? N  Using the Toilet? N  In the past six months, have you accidently leaked urine? N  Do you have problems with loss of bowel control? N  Managing your Medications? N  Managing your Finances? N  Housekeeping or managing your Housekeeping? N  Some recent data might be hidden    Patient Care Team: Einar Pheasant, MD as PCP - General (Internal Medicine)    Assessment:    This is a routine wellness examination for [name]. The goal of the wellness visit is to assist the patient how to close the gaps in care and create a preventative care plan for the patient.   Osteoporosis risk reviewed.  Medications reviewed; taking without issues or barriers.  Safety issues reviewed; lives with husband.  Smoke detectors in the home. Firearms secured in a locked area within the home. Wears seatbelts when driving or riding with others. No  violence in the home.  No identified risk were noted; The patient was oriented x 3; appropriate in dress and manner and no objective failures at ADL's or IADL's.   BMI; discussed the importance of a healthy diet, water intake and exercise. She has a healthy diet, adequate water intake and lans to increase her physical activity. Educational material provided.  HTN; controlled with medication.  Followed by PCP.  Prevnar 13 vaccine administered R deltoid. Tolerated well.  Educational information provided.  Hepatitis C Screening discussed; deferred per patient request.  Educational material provided.     TDAP and ZOSTAVAX vaccine postponed for follow up with insurance.  Educational material provided.  Patient Concerns: None at this time. Follow up with PCP as needed.  Exercise Activities and Dietary recommendations Current Exercise Habits: Home exercise routine, Type of exercise: walking;stretching, Time (Minutes): 10, Frequency (Times/Week): 6, Weekly Exercise (Minutes/Week): 60, Intensity: Mild  Goals    . Increase physical activity          Continue chair exercises, increase as tolerated.   Water exercises.      Fall Risk Fall Risk  07/21/2016 07/14/2016 07/02/2016 04/28/2016 07/20/2015  Falls in the past year? No No No No No   Depression Screen PHQ 2/9 Scores 07/21/2016 07/14/2016 07/02/2016 04/28/2016  PHQ - 2 Score 0 0 0 0     Cognitive Function MMSE - Mini Mental State Exam 07/21/2016 07/20/2015  Orientation to time 5 5  Orientation to Place 5 5  Registration 3 3  Attention/ Calculation 5 5  Recall 2 3  Recall-comments 2 out of 3 recalled -  Language- name 2 objects 2 2  Language- repeat 1 1  Language- follow 3 step command 3 3  Language- read & follow direction 1 1  Write a sentence 1 1  Copy design 1 1  Total score 29 30        Immunization History  Administered Date(s) Administered  . Influenza Split 05/13/2011, 08/12/2012, 04/29/2014  . Influenza, High  Dose Seasonal PF 07/14/2016  . Influenza,inj,Quad PF,36+ Mos 05/06/2013, 06/08/2015  . Pneumococcal Conjugate-13 07/21/2016   Screening Tests Health Maintenance  Topic Date Due  . Hepatitis C Screening  April 09, 1946  . ZOSTAVAX  07/10/2017 (Originally 11/18/2005)  . TETANUS/TDAP  07/10/2017 (Originally 11/18/1964)  . MAMMOGRAM  05/05/2017  . PNA vac Low Risk Adult (2 of 2 - PPSV23) 07/21/2017  . COLONOSCOPY  12/01/2022  . INFLUENZA VACCINE  Completed  . DEXA SCAN  Completed      Plan:    End of life planning; Advance aging; Advanced directives discussed. NO HCPOA/Living Will.  Additional information declined at this time.    Medicare Attestation I have personally reviewed:  The patient's medical and social history Their use of alcohol, tobacco or illicit drugs Their current medications and supplements The patient's functional ability including ADLs,fall risks, home safety risks, cognitive, and hearing and visual impairment Diet and physical activities Evidence for depression   The patient's weight, height, BMI, and visual acuity have been recorded in the chart.  I have made referrals and provided education to the patient based on review of the above and I have provided the patient with a written personalized care plan for preventive services.    During the course of the visit the patient was educated and counseled about the following appropriate screening and preventive services:   Vaccines to include Pneumoccal, Influenza, Hepatitis B, Td, Zostavax, HCV  Electrocardiogram  Cardiovascular Disease  Colorectal cancer screening  Bone density screening  Diabetes screening  Glaucoma screening  Mammography/PAP  Nutrition counseling   Patient Instructions (the written plan) was given to the patient.   Varney Biles, LPN  [id number]   Reviewed above information.  Agree with plan.  Dr Nicki Reaper

## 2016-09-04 DIAGNOSIS — M064 Inflammatory polyarthropathy: Secondary | ICD-10-CM | POA: Diagnosis not present

## 2016-09-04 DIAGNOSIS — M179 Osteoarthritis of knee, unspecified: Secondary | ICD-10-CM | POA: Diagnosis not present

## 2016-09-04 DIAGNOSIS — G894 Chronic pain syndrome: Secondary | ICD-10-CM | POA: Diagnosis not present

## 2016-09-29 DIAGNOSIS — M25561 Pain in right knee: Secondary | ICD-10-CM | POA: Diagnosis not present

## 2016-09-29 DIAGNOSIS — Z96651 Presence of right artificial knee joint: Secondary | ICD-10-CM | POA: Diagnosis not present

## 2016-10-03 DIAGNOSIS — M25561 Pain in right knee: Secondary | ICD-10-CM | POA: Diagnosis not present

## 2016-10-03 DIAGNOSIS — Z96651 Presence of right artificial knee joint: Secondary | ICD-10-CM | POA: Diagnosis not present

## 2016-11-18 DIAGNOSIS — H401132 Primary open-angle glaucoma, bilateral, moderate stage: Secondary | ICD-10-CM | POA: Diagnosis not present

## 2016-11-20 ENCOUNTER — Ambulatory Visit (INDEPENDENT_AMBULATORY_CARE_PROVIDER_SITE_OTHER): Payer: Medicare Other | Admitting: Internal Medicine

## 2016-11-20 ENCOUNTER — Encounter: Payer: Self-pay | Admitting: Internal Medicine

## 2016-11-20 DIAGNOSIS — I10 Essential (primary) hypertension: Secondary | ICD-10-CM | POA: Diagnosis not present

## 2016-11-20 DIAGNOSIS — K5010 Crohn's disease of large intestine without complications: Secondary | ICD-10-CM

## 2016-11-20 DIAGNOSIS — M25561 Pain in right knee: Secondary | ICD-10-CM

## 2016-11-20 DIAGNOSIS — E7800 Pure hypercholesterolemia, unspecified: Secondary | ICD-10-CM | POA: Diagnosis not present

## 2016-11-20 MED ORDER — ATENOLOL 50 MG PO TABS: ORAL_TABLET | ORAL | 3 refills | Status: DC

## 2016-11-20 MED ORDER — AMLODIPINE BESYLATE 5 MG PO TABS: 5.0000 mg | ORAL_TABLET | Freq: Every day | ORAL | 1 refills | Status: DC

## 2016-11-20 MED ORDER — LISINOPRIL 40 MG PO TABS: ORAL_TABLET | ORAL | 3 refills | Status: DC

## 2016-11-20 NOTE — Progress Notes (Signed)
Patient ID: Ellen Drake, female   DOB: Jul 08, 1946, 71 y.o.   MRN: [id number]   Subjective:    Patient ID: Ellen Drake, female    DOB: [birth year], 71 y.o.   MRN: [id number]  HPI  Patient here for a scheduled follow up.  Still having knee pain.  Main complaint.  Limits her activity.  Wears a brace and this allows her to be able to walk some.  States has non stop pain.  Takes oxycodone q hs.  Prescribed by ortho.  Has f/u next month with a new specialist.  States otherwise doing well.  No chest pain.  No sob.  No acid reflux.  No abdominal pain.  Bowels moving and doing well.  No recent flares.     Past Medical History:  Diagnosis Date  . Crohn's disease ([place])   . Diverticulosis   . Fibrocystic breast disease   . Hypercholesterolemia   . Hypertension    Past Surgical History:  Procedure Laterality Date  . anterior cervical discectomy and fusion  1998   Dr Rolin Barry  . BACK SURGERY  1992   ruptured disc (Dr Mauri Pole)  . BREAST BIOPSY Left    neg-no scar seen  . KNEE SURGERY Right 1/16   [facility] (Dr. Sherlynn Carbon)   Family History  Problem Relation Age of Onset  . CVA Mother   . Hypertension Mother   . Hypertension Sister   . Breast cancer Neg Hx   . Colon cancer Neg Hx    Social History   Social History  . Marital status: Married    Spouse name: N/A  . Number of children: 0  . Years of education: N/A   Social History Main Topics  . Smoking status: Former Smoker    Quit date: 08/11/2008  . Smokeless tobacco: Never Used  . Alcohol use 0.0 oz/week     Comment: wine occasional  . Drug use: No  . Sexual activity: Yes   Other Topics Concern  . None   Social History Narrative  . None    Outpatient Encounter Prescriptions as of 11/20/2016  Medication Sig  . albuterol (PROVENTIL HFA) 108 (90 Base) MCG/ACT inhaler Inhale 2 puffs into the lungs every 6 (six) hours as needed for wheezing or shortness of breath.  Marland Kitchen aspirin 81 MG tablet Take 81 mg by mouth daily.  Marland Kitchen  atenolol (TENORMIN) 50 MG tablet TAKE 1 TABLET (50 MG TOTAL) BY MOUTH DAILY.  . brimonidine (ALPHAGAN) 0.2 % ophthalmic solution 1 drop 3 (three) times daily.  Marland Kitchen HYDROcodone-acetaminophen (NORCO/VICODIN) 5-325 MG per tablet Take 1 tablet by mouth every 6 (six) hours as needed. for pain  . latanoprost (XALATAN) 0.005 % ophthalmic solution 1 drop at bedtime.  Marland Kitchen lisinopril (PRINIVIL,ZESTRIL) 40 MG tablet TAKE 1 TABLET (40 MG TOTAL) BY MOUTH DAILY.  Marland Kitchen sulfaSALAzine (AZULFIDINE) 500 MG tablet Take 1,000 mg by mouth 3 (three) times daily.  . timolol (TIMOPTIC) 0.5 % ophthalmic solution 1 drop 2 (two) times daily.   Marland Kitchen triamcinolone ointment (KENALOG) 0.5 % Apply 1 application topically 2 (two) times daily.  . [DISCONTINUED] atenolol (TENORMIN) 50 MG tablet TAKE 1 TABLET (50 MG TOTAL) BY MOUTH DAILY.  . [DISCONTINUED] lisinopril (PRINIVIL,ZESTRIL) 40 MG tablet TAKE 1 TABLET (40 MG TOTAL) BY MOUTH DAILY.  . [DISCONTINUED] tapentadol (NUCYNTA ER) 50 MG 12 hr tablet Take 50 mg by mouth every 12 (twelve) hours.  . [DISCONTINUED] vitamin E (VITAMIN E) 400 UNIT capsule Take 400 Units by mouth  daily.  . amLODipine (NORVASC) 5 MG tablet Take 1 tablet (5 mg total) by mouth daily.  . [DISCONTINUED] DULoxetine (CYMBALTA) 30 MG capsule Take 30 mg by mouth daily.   No facility-administered encounter medications on file as of 11/20/2016.     Review of Systems  Constitutional: Negative for appetite change and unexpected weight change.  HENT: Negative for congestion and sinus pressure.   Respiratory: Negative for cough, chest tightness and shortness of breath.   Cardiovascular: Negative for chest pain and palpitations.  Gastrointestinal: Negative for abdominal pain, diarrhea, nausea and vomiting.  Genitourinary: Negative for difficulty urinating and dysuria.  Musculoskeletal: Negative for back pain.       Persistent knee pain and swelling.    Skin: Negative for color change and rash.  Neurological: Negative for  dizziness, light-headedness and headaches.  Psychiatric/Behavioral: Negative for agitation and dysphoric mood.       Objective:    Physical Exam  Constitutional: She appears well-developed and well-nourished. No distress.  HENT:  Nose: Nose normal.  Mouth/Throat: Oropharynx is clear and moist.  Neck: Neck supple. No thyromegaly present.  Cardiovascular: Normal rate and regular rhythm.   Pulmonary/Chest: Breath sounds normal. No respiratory distress. She has no wheezes.  Abdominal: Soft. Bowel sounds are normal. There is no tenderness.  Musculoskeletal:  Increased swelling of knee.  Persistent pain.  No increased erythema.    Lymphadenopathy:    She has no cervical adenopathy.  Skin: No rash noted. No erythema.  Psychiatric: She has a normal mood and affect.    BP 140/70 (BP Location: Left Arm, Patient Position: Sitting, Cuff Size: Normal)   Pulse 61   Temp 98.6 F (37 C) (Oral)   Resp 12   Ht 5' 5"  (1.651 m)   Wt 155 lb 3.2 oz (70.4 kg)   LMP 08/13/1995   SpO2 97%   BMI 25.83 kg/m  Wt Readings from Last 3 Encounters:  11/20/16 155 lb 3.2 oz (70.4 kg)  07/21/16 148 lb 9.6 oz (67.4 kg)  07/14/16 150 lb (68 kg)     Lab Results  Component Value Date   WBC 11.7 (H) 07/14/2016   HGB 13.1 07/14/2016   HCT 39.1 07/14/2016   PLT 212.0 07/14/2016   GLUCOSE 108 (H) 07/14/2016   CHOL 216 (H) 07/14/2016   TRIG 126.0 07/14/2016   HDL 58.90 07/14/2016   LDLDIRECT 132.5 05/06/2013   LDLCALC 132 (H) 07/14/2016   ALT 13 07/14/2016   AST 20 07/14/2016   NA 142 07/14/2016   K 4.0 07/14/2016   CL 107 07/14/2016   CREATININE 0.84 07/14/2016   BUN 14 07/14/2016   CO2 29 07/14/2016   TSH 1.98 12/10/2015   HGBA1C 5.7 10/19/2012    Mm Screening Breast Tomo Bilateral  Result Date: 05/05/2016 CLINICAL DATA:  Screening. EXAM: 2D DIGITAL SCREENING BILATERAL MAMMOGRAM WITH CAD AND ADJUNCT TOMO COMPARISON:  Previous exam(s). ACR Breast Density Category b: There are scattered areas  of fibroglandular density. FINDINGS: There are no findings suspicious for malignancy. Images were processed with CAD. IMPRESSION: No mammographic evidence of malignancy. A result letter of this screening mammogram will be mailed directly to the patient. RECOMMENDATION: Screening mammogram in one year. (Code:SM-B-01Y) BI-RADS CATEGORY  1: Negative. Electronically Signed   By: Marin Olp M.D.   On: 05/05/2016 12:50       Assessment & Plan:   Problem List Items Addressed This Visit    Crohn's disease ([place])    Followed by GI.  Doing well on current regimen.        Relevant Orders   CBC with Differential/Platelet   Hypercholesterolemia    Low cholesterol diet and exercise.  Follow lipid panel.        Relevant Medications   atenolol (TENORMIN) 50 MG tablet   lisinopril (PRINIVIL,ZESTRIL) 40 MG tablet   amLODipine (NORVASC) 5 MG tablet   Other Relevant Orders   Hepatic function panel   Lipid panel   Hypertension    Blood pressure remains elevated.  Add amlodipine 50m q day.  Follow pressures.  Follow metabolic panel.        Relevant Medications   atenolol (TENORMIN) 50 MG tablet   lisinopril (PRINIVIL,ZESTRIL) 40 MG tablet   amLODipine (NORVASC) 5 MG tablet   Other Relevant Orders   TSH   Basic metabolic panel   Knee pain    Persistent pain and swelling.  Followed by ortho. Planning to f/u with a new specialist next month.            SEinar Pheasant MD

## 2016-11-20 NOTE — Progress Notes (Signed)
Pre-visit discussion using our clinic review tool. No additional management support is needed unless otherwise documented below in the visit note.  

## 2016-11-23 ENCOUNTER — Encounter: Payer: Self-pay | Admitting: Internal Medicine

## 2016-11-23 NOTE — Assessment & Plan Note (Signed)
Blood pressure remains elevated.  Add amlodipine 13m q day.  Follow pressures.  Follow metabolic panel.

## 2016-11-23 NOTE — Assessment & Plan Note (Signed)
Low cholesterol diet and exercise.  Follow lipid panel.

## 2016-11-23 NOTE — Assessment & Plan Note (Signed)
Followed by GI.  Doing well on current regimen.

## 2016-11-23 NOTE — Assessment & Plan Note (Signed)
Persistent pain and swelling.  Followed by ortho. Planning to f/u with a new specialist next month.

## 2016-11-25 DIAGNOSIS — H401132 Primary open-angle glaucoma, bilateral, moderate stage: Secondary | ICD-10-CM | POA: Diagnosis not present

## 2016-12-04 DIAGNOSIS — G894 Chronic pain syndrome: Secondary | ICD-10-CM | POA: Diagnosis not present

## 2016-12-04 DIAGNOSIS — M064 Inflammatory polyarthropathy: Secondary | ICD-10-CM | POA: Diagnosis not present

## 2016-12-04 DIAGNOSIS — M179 Osteoarthritis of knee, unspecified: Secondary | ICD-10-CM | POA: Diagnosis not present

## 2016-12-09 DIAGNOSIS — M25561 Pain in right knee: Secondary | ICD-10-CM | POA: Diagnosis not present

## 2016-12-29 DIAGNOSIS — Z5181 Encounter for therapeutic drug level monitoring: Secondary | ICD-10-CM | POA: Diagnosis not present

## 2016-12-29 DIAGNOSIS — Z86010 Personal history of colonic polyps: Secondary | ICD-10-CM | POA: Diagnosis not present

## 2016-12-29 DIAGNOSIS — K5010 Crohn's disease of large intestine without complications: Secondary | ICD-10-CM | POA: Diagnosis not present

## 2016-12-29 DIAGNOSIS — Z9049 Acquired absence of other specified parts of digestive tract: Secondary | ICD-10-CM | POA: Diagnosis not present

## 2016-12-30 ENCOUNTER — Other Ambulatory Visit (INDEPENDENT_AMBULATORY_CARE_PROVIDER_SITE_OTHER): Payer: Medicare Other

## 2016-12-30 DIAGNOSIS — I10 Essential (primary) hypertension: Secondary | ICD-10-CM

## 2016-12-30 DIAGNOSIS — K5010 Crohn's disease of large intestine without complications: Secondary | ICD-10-CM | POA: Diagnosis not present

## 2016-12-30 DIAGNOSIS — E7800 Pure hypercholesterolemia, unspecified: Secondary | ICD-10-CM | POA: Diagnosis not present

## 2016-12-30 LAB — CBC WITH DIFFERENTIAL/PLATELET
Basophils Absolute: 0 10*3/uL (ref 0.0–0.1)
Basophils Relative: 0.3 % (ref 0.0–3.0)
Eosinophils Absolute: 0.1 10*3/uL (ref 0.0–0.7)
Eosinophils Relative: 0.5 % (ref 0.0–5.0)
HCT: 38 % (ref 36.0–46.0)
Hemoglobin: 12.5 g/dL (ref 12.0–15.0)
Lymphocytes Relative: 30.6 % (ref 12.0–46.0)
Lymphs Abs: 3.7 10*3/uL (ref 0.7–4.0)
MCHC: 33 g/dL (ref 30.0–36.0)
MCV: 96.7 fl (ref 78.0–100.0)
Monocytes Absolute: 1 10*3/uL (ref 0.1–1.0)
Monocytes Relative: 8.2 % (ref 3.0–12.0)
Neutro Abs: 7.4 10*3/uL (ref 1.4–7.7)
Neutrophils Relative %: 60.4 % (ref 43.0–77.0)
Platelets: 222 10*3/uL (ref 150.0–400.0)
RBC: 3.93 Mil/uL (ref 3.87–5.11)
RDW: 14 % (ref 11.5–15.5)
WBC: 12.2 10*3/uL — ABNORMAL HIGH (ref 4.0–10.5)

## 2016-12-30 LAB — BASIC METABOLIC PANEL
BUN: 15 mg/dL (ref 6–23)
CO2: 29 mEq/L (ref 19–32)
Calcium: 9.3 mg/dL (ref 8.4–10.5)
Chloride: 107 mEq/L (ref 96–112)
Creatinine, Ser: 0.83 mg/dL (ref 0.40–1.20)
GFR: 87.12 mL/min (ref >60.00)
Glucose, Bld: 97 mg/dL (ref 70–99)
Potassium: 3.8 mEq/L (ref 3.5–5.1)
Sodium: 143 mEq/L (ref 135–145)

## 2016-12-30 LAB — HEPATIC FUNCTION PANEL
ALT: 12 U/L (ref 0–35)
AST: 18 U/L (ref 0–37)
Albumin: 4.1 g/dL (ref 3.5–5.2)
Alkaline Phosphatase: 74 U/L (ref 39–117)
Bilirubin, Direct: 0.1 mg/dL (ref 0.0–0.3)
Total Bilirubin: 0.3 mg/dL (ref 0.2–1.2)
Total Protein: 6.8 g/dL (ref 6.0–8.3)

## 2016-12-30 LAB — LIPID PANEL
Cholesterol: 207 mg/dL — ABNORMAL HIGH (ref 0–200)
HDL: 62.6 mg/dL (ref >39.00)
LDL Cholesterol: 115 mg/dL — ABNORMAL HIGH (ref 0–99)
NonHDL: 144.34
Total CHOL/HDL Ratio: 3
Triglycerides: 149 mg/dL (ref 0.0–149.0)
VLDL: 29.8 mg/dL (ref 0.0–40.0)

## 2016-12-30 LAB — TSH: TSH: 2.05 u[IU]/mL (ref 0.35–4.50)

## 2017-01-06 ENCOUNTER — Encounter: Payer: Self-pay | Admitting: Internal Medicine

## 2017-01-06 ENCOUNTER — Ambulatory Visit (INDEPENDENT_AMBULATORY_CARE_PROVIDER_SITE_OTHER): Payer: Medicare Other | Admitting: Internal Medicine

## 2017-01-06 VITALS — BP 126/68 | HR 60 | Temp 97.7°F | Resp 12 | Ht 65.0 in | Wt 154.8 lb

## 2017-01-06 DIAGNOSIS — I10 Essential (primary) hypertension: Secondary | ICD-10-CM | POA: Diagnosis not present

## 2017-01-06 DIAGNOSIS — E7800 Pure hypercholesterolemia, unspecified: Secondary | ICD-10-CM

## 2017-01-06 DIAGNOSIS — D72829 Elevated white blood cell count, unspecified: Secondary | ICD-10-CM

## 2017-01-06 DIAGNOSIS — M25561 Pain in right knee: Secondary | ICD-10-CM | POA: Diagnosis not present

## 2017-01-06 DIAGNOSIS — K5010 Crohn's disease of large intestine without complications: Secondary | ICD-10-CM | POA: Diagnosis not present

## 2017-01-06 LAB — CBC WITH DIFFERENTIAL/PLATELET
Basophils Absolute: 0 10*3/uL (ref 0.0–0.1)
Basophils Relative: 0.3 % (ref 0.0–3.0)
Eosinophils Absolute: 0.1 10*3/uL (ref 0.0–0.7)
Eosinophils Relative: 1 % (ref 0.0–5.0)
HCT: 37.9 % (ref 36.0–46.0)
Hemoglobin: 12.6 g/dL (ref 12.0–15.0)
Lymphocytes Relative: 20.1 % (ref 12.0–46.0)
Lymphs Abs: 2.1 10*3/uL (ref 0.7–4.0)
MCHC: 33.2 g/dL (ref 30.0–36.0)
MCV: 96.7 fl (ref 78.0–100.0)
Monocytes Absolute: 1 10*3/uL (ref 0.1–1.0)
Monocytes Relative: 9.5 % (ref 3.0–12.0)
Neutro Abs: 7.2 10*3/uL (ref 1.4–7.7)
Neutrophils Relative %: 69.1 % (ref 43.0–77.0)
Platelets: 201 10*3/uL (ref 150.0–400.0)
RBC: 3.92 Mil/uL (ref 3.87–5.11)
RDW: 14 % (ref 11.5–15.5)
WBC: 10.4 10*3/uL (ref 4.0–10.5)

## 2017-01-06 MED ORDER — AMLODIPINE BESYLATE 5 MG PO TABS: 5.0000 mg | ORAL_TABLET | Freq: Every day | ORAL | 1 refills | Status: DC

## 2017-01-06 NOTE — Progress Notes (Signed)
Pre-visit discussion using our clinic review tool. No additional management support is needed unless otherwise documented below in the visit note.  

## 2017-01-06 NOTE — Assessment & Plan Note (Signed)
Persistent pain and swelling.  Followed by ortho.  Nerve block did not help.  Planning to see dermatology.

## 2017-01-06 NOTE — Assessment & Plan Note (Signed)
Low cholesterol diet and exercise.  Follow lipid panel.   Lab Results  Component Value Date   CHOL 207 (H) 12/30/2016   HDL 62.60 12/30/2016   LDLCALC 115 (H) 12/30/2016   LDLDIRECT 132.5 05/06/2013   TRIG 149.0 12/30/2016   CHOLHDL 3 12/30/2016

## 2017-01-06 NOTE — Assessment & Plan Note (Addendum)
Blood pressure under good control.  Doing better since starting amlodipine.  Continue same medication regimen.  Follow pressures.  Follow metabolic panel.

## 2017-01-06 NOTE — Progress Notes (Signed)
Patient ID: Ellen Drake, female   DOB: May 18, 1946, 71 y.o.   MRN: [id number]   Subjective:    Patient ID: Ellen Drake, female    DOB: 21-Nov-1945, 71 y.o.   MRN: [id number]  HPI  Patient here for a scheduled follow up.  She reports she is doing relatively well.  Her main complaint is persistent knee pain.  Sees ortho.  Had recent nerve block.  Did not help.  Planning to see dermatology to confirm no allergy to metal, etc.  No chest pain.  No sob.  No acid reflux.  No abdominal pain.  Just saw Dr Redmond Pulling.  Bowels stable.  States she fell two weeks ago.  Missed step.  Hit knee.  Did try to protect herself. No head injury.  She discussed with ortho.  Was given prednisone.  No change in pain.  No worsening issues.  Last visit, started amlodipine.  Blood pressure is better.     Past Medical History:  Diagnosis Date  . Crohn's disease ([place])   . Diverticulosis   . Fibrocystic breast disease   . Hypercholesterolemia   . Hypertension    Past Surgical History:  Procedure Laterality Date  . anterior cervical discectomy and fusion  1998   Dr Rolin Barry  . BACK SURGERY  1992   ruptured disc (Dr Mauri Pole)  . BREAST BIOPSY Left    neg-no scar seen  . KNEE SURGERY Right 1/16   [facility] (Dr. Sherlynn Carbon)   Family History  Problem Relation Age of Onset  . CVA Mother   . Hypertension Mother   . Hypertension Sister   . Breast cancer Neg Hx   . Colon cancer Neg Hx    Social History   Social History  . Marital status: Married    Spouse name: N/A  . Number of children: 0  . Years of education: N/A   Social History Main Topics  . Smoking status: Former Smoker    Quit date: 08/11/2008  . Smokeless tobacco: Never Used  . Alcohol use 0.0 oz/week     Comment: wine occasional  . Drug use: No  . Sexual activity: Yes   Other Topics Concern  . None   Social History Narrative  . None    Outpatient Encounter Prescriptions as of 01/06/2017  Medication Sig  . albuterol (PROVENTIL HFA) 108  (90 Base) MCG/ACT inhaler Inhale 2 puffs into the lungs every 6 (six) hours as needed for wheezing or shortness of breath.  Marland Kitchen amLODipine (NORVASC) 5 MG tablet Take 1 tablet (5 mg total) by mouth daily.  Marland Kitchen aspirin 81 MG tablet Take 81 mg by mouth daily.  Marland Kitchen atenolol (TENORMIN) 50 MG tablet TAKE 1 TABLET (50 MG TOTAL) BY MOUTH DAILY.  . brimonidine (ALPHAGAN) 0.2 % ophthalmic solution 1 drop 3 (three) times daily.  Marland Kitchen HYDROcodone-acetaminophen (NORCO/VICODIN) 5-325 MG per tablet Take 1 tablet by mouth every 6 (six) hours as needed. for pain  . latanoprost (XALATAN) 0.005 % ophthalmic solution 1 drop at bedtime.  Marland Kitchen lisinopril (PRINIVIL,ZESTRIL) 40 MG tablet TAKE 1 TABLET (40 MG TOTAL) BY MOUTH DAILY.  Marland Kitchen sulfaSALAzine (AZULFIDINE) 500 MG tablet Take 1,000 mg by mouth 3 (three) times daily.  . timolol (TIMOPTIC) 0.5 % ophthalmic solution 1 drop 2 (two) times daily.   Marland Kitchen triamcinolone ointment (KENALOG) 0.5 % Apply 1 application topically 2 (two) times daily.  . [DISCONTINUED] amLODipine (NORVASC) 5 MG tablet Take 1 tablet (5 mg total) by mouth daily.   No  facility-administered encounter medications on file as of 01/06/2017.     Review of Systems  Constitutional: Negative for appetite change and unexpected weight change.  HENT: Negative for congestion and sinus pressure.   Respiratory: Negative for cough, chest tightness and shortness of breath.   Cardiovascular: Negative for chest pain, palpitations and leg swelling.  Gastrointestinal: Negative for abdominal pain, diarrhea, nausea and vomiting.  Genitourinary: Negative for difficulty urinating and dysuria.  Musculoskeletal: Negative for back pain.       Persistent knee pain as outlined.    Skin: Negative for color change and rash.  Neurological: Negative for dizziness, light-headedness and headaches.  Psychiatric/Behavioral: Negative for agitation and dysphoric mood.       Objective:    Physical Exam  Constitutional: She appears  well-developed and well-nourished. No distress.  HENT:  Nose: Nose normal.  Mouth/Throat: Oropharynx is clear and moist.  Neck: Neck supple. No thyromegaly present.  Cardiovascular: Normal rate and regular rhythm.   Pulmonary/Chest: Breath sounds normal. No respiratory distress. She has no wheezes.  Abdominal: Soft. Bowel sounds are normal. There is no tenderness.  Musculoskeletal: She exhibits no tenderness.  Persistent increased swelling - knee.    Lymphadenopathy:    She has no cervical adenopathy.  Skin: No rash noted. No erythema.  Psychiatric: She has a normal mood and affect. Her behavior is normal.    BP 126/68 (BP Location: Left Arm, Patient Position: Sitting, Cuff Size: Normal)   Pulse 60   Temp 97.7 F (36.5 C) (Oral)   Resp 12   Ht 5' 5"  (1.651 m)   Wt 154 lb 12.8 oz (70.2 kg)   LMP 08/13/1995   SpO2 98%   BMI 25.76 kg/m  Wt Readings from Last 3 Encounters:  01/06/17 154 lb 12.8 oz (70.2 kg)  11/20/16 155 lb 3.2 oz (70.4 kg)  07/21/16 148 lb 9.6 oz (67.4 kg)     Lab Results  Component Value Date   WBC 10.4 01/06/2017   HGB 12.6 01/06/2017   HCT 37.9 01/06/2017   PLT 201.0 01/06/2017   GLUCOSE 97 12/30/2016   CHOL 207 (H) 12/30/2016   TRIG 149.0 12/30/2016   HDL 62.60 12/30/2016   LDLDIRECT 132.5 05/06/2013   LDLCALC 115 (H) 12/30/2016   ALT 12 12/30/2016   AST 18 12/30/2016   NA 143 12/30/2016   K 3.8 12/30/2016   CL 107 12/30/2016   CREATININE 0.83 12/30/2016   BUN 15 12/30/2016   CO2 29 12/30/2016   TSH 2.05 12/30/2016   HGBA1C 5.7 10/19/2012    Mm Screening Breast Tomo Bilateral  Result Date: 05/05/2016 CLINICAL DATA:  Screening. EXAM: 2D DIGITAL SCREENING BILATERAL MAMMOGRAM WITH CAD AND ADJUNCT TOMO COMPARISON:  Previous exam(s). ACR Breast Density Category b: There are scattered areas of fibroglandular density. FINDINGS: There are no findings suspicious for malignancy. Images were processed with CAD. IMPRESSION: No mammographic evidence of  malignancy. A result letter of this screening mammogram will be mailed directly to the patient. RECOMMENDATION: Screening mammogram in one year. (Code:SM-B-01Y) BI-RADS CATEGORY  1: Negative. Electronically Signed   By: Marin Olp M.D.   On: 05/05/2016 12:50       Assessment & Plan:   Problem List Items Addressed This Visit    Crohn's disease ([place])    Currently doing well.  Bowels doing well.  Followed by Dr Redmond Pulling.  Stable       Hypercholesterolemia    Low cholesterol diet and exercise.  Follow lipid panel.  Lab Results  Component Value Date   CHOL 207 (H) 12/30/2016   HDL 62.60 12/30/2016   LDLCALC 115 (H) 12/30/2016   LDLDIRECT 132.5 05/06/2013   TRIG 149.0 12/30/2016   CHOLHDL 3 12/30/2016        Relevant Medications   amLODipine (NORVASC) 5 MG tablet   Hypertension    Blood pressure under good control.  Doing better since starting amlodipine.  Continue same medication regimen.  Follow pressures.  Follow metabolic panel.        Relevant Medications   amLODipine (NORVASC) 5 MG tablet   Knee pain    Persistent pain and swelling.  Followed by ortho.  Nerve block did not help.  Planning to see dermatology.         Other Visit Diagnoses    Leukocytosis, unspecified type    -  Primary   Relevant Orders   CBC with Differential/Platelet (Completed)       Einar Pheasant, MD

## 2017-01-06 NOTE — Assessment & Plan Note (Signed)
Currently doing well.  Bowels doing well.  Followed by Dr Redmond Pulling.  Stable

## 2017-02-03 DIAGNOSIS — G894 Chronic pain syndrome: Secondary | ICD-10-CM | POA: Diagnosis not present

## 2017-02-03 DIAGNOSIS — M25561 Pain in right knee: Secondary | ICD-10-CM | POA: Diagnosis not present

## 2017-03-02 DIAGNOSIS — M25561 Pain in right knee: Secondary | ICD-10-CM | POA: Diagnosis not present

## 2017-03-02 DIAGNOSIS — Z96651 Presence of right artificial knee joint: Secondary | ICD-10-CM | POA: Diagnosis not present

## 2017-03-09 ENCOUNTER — Telehealth: Payer: Self-pay | Admitting: *Deleted

## 2017-03-09 DIAGNOSIS — Z01818 Encounter for other preprocedural examination: Secondary | ICD-10-CM | POA: Diagnosis not present

## 2017-03-09 DIAGNOSIS — I10 Essential (primary) hypertension: Secondary | ICD-10-CM | POA: Diagnosis not present

## 2017-03-09 DIAGNOSIS — T84032D Mechanical loosening of internal right knee prosthetic joint, subsequent encounter: Secondary | ICD-10-CM | POA: Diagnosis not present

## 2017-03-09 DIAGNOSIS — M25561 Pain in right knee: Secondary | ICD-10-CM | POA: Diagnosis not present

## 2017-03-09 DIAGNOSIS — Z96651 Presence of right artificial knee joint: Secondary | ICD-10-CM | POA: Diagnosis not present

## 2017-03-09 DIAGNOSIS — Z01812 Encounter for preprocedural laboratory examination: Secondary | ICD-10-CM | POA: Diagnosis not present

## 2017-03-09 NOTE — Telephone Encounter (Signed)
[facility] has requested a update on the medical clarence form sent over . [employer] [phone] ext [phone]  Fax [phone]

## 2017-03-09 NOTE — Telephone Encounter (Signed)
I do not remember seeing this is it something someone else may have put in your box?

## 2017-03-09 NOTE — Telephone Encounter (Signed)
I have not seen this form.  Can they send again.  Let them know we have not received.  When is her surgery scheduled?

## 2017-03-09 NOTE — Telephone Encounter (Signed)
Called office will fax to back fax now

## 2017-03-09 NOTE — Telephone Encounter (Signed)
Fax received put in you blue folder. Last app with you was 5/29. Does not have f/u until 05/11/17

## 2017-03-10 ENCOUNTER — Ambulatory Visit (INDEPENDENT_AMBULATORY_CARE_PROVIDER_SITE_OTHER): Payer: Medicare Other | Admitting: Internal Medicine

## 2017-03-10 ENCOUNTER — Encounter: Payer: Self-pay | Admitting: Internal Medicine

## 2017-03-10 DIAGNOSIS — K5010 Crohn's disease of large intestine without complications: Secondary | ICD-10-CM

## 2017-03-10 DIAGNOSIS — M25561 Pain in right knee: Secondary | ICD-10-CM | POA: Diagnosis not present

## 2017-03-10 DIAGNOSIS — I10 Essential (primary) hypertension: Secondary | ICD-10-CM | POA: Diagnosis not present

## 2017-03-10 DIAGNOSIS — Z01818 Encounter for other preprocedural examination: Secondary | ICD-10-CM | POA: Diagnosis not present

## 2017-03-10 NOTE — Telephone Encounter (Signed)
Patient added.

## 2017-03-10 NOTE — Telephone Encounter (Signed)
I am going to have to see her for pre op evaluation.  It appears her surgery is scheduled for 03/16/17.  The only place I see to put her is 03/10/17 at 4:30.  See if she can come in then.

## 2017-03-10 NOTE — Telephone Encounter (Signed)
I have called patient she will be here at 4:15 to check in for 4:30. Can you add to Dr. Nicki Reaper schedule?

## 2017-03-10 NOTE — Progress Notes (Signed)
Patient ID: Ellen Drake, female   DOB: 26-Sep-1945, 71 y.o.   MRN: [id number]   Subjective:    Patient ID: Ellen Drake, female    DOB: 1946/01/03, 71 y.o.   MRN: [id number]  HPI  Patient here as a work in for pre op evaluation.  She is planning to have another knee surgery next week.  Here for pre op clearance.  Trying to stay active.  Knee does limit her.  No chest pain.  No sob.  No acid reflux.  No abdominal pain.  Bowels moving.  Overall doing well,except for her knee.     Past Medical History:  Diagnosis Date  . Crohn's disease ([place])   . Diverticulosis   . Fibrocystic breast disease   . Hypercholesterolemia   . Hypertension    Past Surgical History:  Procedure Laterality Date  . anterior cervical discectomy and fusion  1998   Dr Rolin Barry  . BACK SURGERY  1992   ruptured disc (Dr Mauri Pole)  . BREAST BIOPSY Left    neg-no scar seen  . KNEE SURGERY Right 1/16   [facility] (Dr. Sherlynn Carbon)   Family History  Problem Relation Age of Onset  . CVA Mother   . Hypertension Mother   . Hypertension Sister   . Breast cancer Neg Hx   . Colon cancer Neg Hx    Social History   Social History  . Marital status: Married    Spouse name: N/A  . Number of children: 0  . Years of education: N/A   Social History Main Topics  . Smoking status: Former Smoker    Quit date: 08/11/2008  . Smokeless tobacco: Never Used  . Alcohol use 0.0 oz/week     Comment: wine occasional  . Drug use: No  . Sexual activity: Yes   Other Topics Concern  . None   Social History Narrative  . None    Outpatient Encounter Prescriptions as of 03/10/2017  Medication Sig  . amLODipine (NORVASC) 5 MG tablet Take 1 tablet (5 mg total) by mouth daily.  Marland Kitchen aspirin 81 MG tablet Take 81 mg by mouth daily.  Marland Kitchen atenolol (TENORMIN) 50 MG tablet TAKE 1 TABLET (50 MG TOTAL) BY MOUTH DAILY.  . brimonidine (ALPHAGAN) 0.2 % ophthalmic solution 1 drop 3 (three) times daily.  Marland Kitchen latanoprost (XALATAN) 0.005 %  ophthalmic solution 1 drop at bedtime.  . sulfaSALAzine (AZULFIDINE) 500 MG tablet Take 1,000 mg by mouth 3 (three) times daily.  . timolol (TIMOPTIC) 0.5 % ophthalmic solution 1 drop 2 (two) times daily.   Marland Kitchen triamcinolone ointment (KENALOG) 0.5 % Apply 1 application topically 2 (two) times daily.  . [DISCONTINUED] albuterol (PROVENTIL HFA) 108 (90 Base) MCG/ACT inhaler Inhale 2 puffs into the lungs every 6 (six) hours as needed for wheezing or shortness of breath.  . [DISCONTINUED] HYDROcodone-acetaminophen (NORCO/VICODIN) 5-325 MG per tablet Take 1 tablet by mouth every 6 (six) hours as needed. for pain  . lisinopril (PRINIVIL,ZESTRIL) 40 MG tablet TAKE 1 TABLET (40 MG TOTAL) BY MOUTH DAILY. (Patient not taking: Reported on 03/10/2017)   No facility-administered encounter medications on file as of 03/10/2017.     Review of Systems  Constitutional: Negative for appetite change and unexpected weight change.  HENT: Negative for congestion and sinus pressure.   Respiratory: Negative for cough, chest tightness and shortness of breath.   Cardiovascular: Negative for chest pain, palpitations and leg swelling.  Gastrointestinal: Negative for abdominal pain, diarrhea, nausea and vomiting.  Genitourinary: Negative for difficulty urinating and dysuria.  Musculoskeletal: Negative for back pain.       Persistent knee pain as outlined.    Skin: Negative for color change and rash.  Neurological: Negative for dizziness, light-headedness and headaches.  Psychiatric/Behavioral: Negative for agitation and dysphoric mood.       Objective:    Physical Exam  HENT:  Nose: Nose normal.  Mouth/Throat: Oropharynx is clear and moist.  Neck: Neck supple. No thyromegaly present.  Cardiovascular: Normal rate and regular rhythm.   Pulmonary/Chest: Breath sounds normal. No respiratory distress. She has no wheezes.  Abdominal: Soft. Bowel sounds are normal. There is no tenderness.  Musculoskeletal: She exhibits  no edema or tenderness.  Lymphadenopathy:    She has no cervical adenopathy.  Skin: No rash noted. No erythema.  Psychiatric: She has a normal mood and affect. Her behavior is normal.    BP 136/78 (BP Location: Left Arm, Patient Position: Sitting, Cuff Size: Normal)   Pulse (!) 57   Temp 97.9 F (36.6 C) (Oral)   Wt 155 lb 4 oz (70.4 kg)   LMP 08/13/1995   SpO2 97%   BMI 25.83 kg/m  Wt Readings from Last 3 Encounters:  03/10/17 155 lb 4 oz (70.4 kg)  01/06/17 154 lb 12.8 oz (70.2 kg)  11/20/16 155 lb 3.2 oz (70.4 kg)     Lab Results  Component Value Date   WBC 10.4 01/06/2017   HGB 12.6 01/06/2017   HCT 37.9 01/06/2017   PLT 201.0 01/06/2017   GLUCOSE 88 03/10/2017   CHOL 207 (H) 12/30/2016   TRIG 149.0 12/30/2016   HDL 62.60 12/30/2016   LDLDIRECT 132.5 05/06/2013   LDLCALC 115 (H) 12/30/2016   ALT 12 12/30/2016   AST 18 12/30/2016   NA 144 03/10/2017   K 3.8 03/10/2017   CL 108 03/10/2017   CREATININE 0.83 03/10/2017   BUN 17 03/10/2017   CO2 29 03/10/2017   TSH 2.05 12/30/2016   HGBA1C 5.7 10/19/2012    Mm Screening Breast Tomo Bilateral  Result Date: 05/05/2016 CLINICAL DATA:  Screening. EXAM: 2D DIGITAL SCREENING BILATERAL MAMMOGRAM WITH CAD AND ADJUNCT TOMO COMPARISON:  Previous exam(s). ACR Breast Density Category b: There are scattered areas of fibroglandular density. FINDINGS: There are no findings suspicious for malignancy. Images were processed with CAD. IMPRESSION: No mammographic evidence of malignancy. A result letter of this screening mammogram will be mailed directly to the patient. RECOMMENDATION: Screening mammogram in one year. (Code:SM-B-01Y) BI-RADS CATEGORY  1: Negative. Electronically Signed   By: Marin Olp M.D.   On: 05/05/2016 12:50       Assessment & Plan:   Problem List Items Addressed This Visit    Crohn's disease ([place])    Currently doing well.  Stable.  On sulfasalazine.   Will d/w surgery meds that need to be stopped prior to  surgery.       Hypertension    Blood pressure on my check a little elevated.  She will spot check her pressure and call in readings over the next 2 days.  Will need close intra op and post op monitoring of her blood pressure and heart rate to avoid extremes.        Knee pain    Persistent pain and swelling. Planning for knee surgery next week.  She reports no chest pain.  No sob.  EKG - SR with no acute ischemic changes.  I feel she is at low risk from a  cardiac standpoint to proceed with the planned surgery.  Blood pressure was slightly increased on recheck today.  Will have her spot check her pressure.  She will need close intra op and post op monitoring of her heart rate and blood pressure to avoid extremes.  She will d/w surgery regarding medications that need to be held.         Other Visit Diagnoses    Pre-op exam       Relevant Orders   Basic Metabolic Panel (BMET) (Completed)       Einar Pheasant, MD

## 2017-03-10 NOTE — Progress Notes (Signed)
Pre-visit discussion using our clinic review tool. No additional management support is needed unless otherwise documented below in the visit note.  

## 2017-03-11 LAB — BASIC METABOLIC PANEL
BUN: 17 mg/dL (ref 6–23)
CO2: 29 mEq/L (ref 19–32)
Calcium: 9.6 mg/dL (ref 8.4–10.5)
Chloride: 108 mEq/L (ref 96–112)
Creatinine, Ser: 0.83 mg/dL (ref 0.40–1.20)
GFR: 87.08 mL/min (ref >60.00)
Glucose, Bld: 88 mg/dL (ref 70–99)
Potassium: 3.8 mEq/L (ref 3.5–5.1)
Sodium: 144 mEq/L (ref 135–145)

## 2017-03-12 ENCOUNTER — Telehealth: Payer: Self-pay | Admitting: Internal Medicine

## 2017-03-12 ENCOUNTER — Encounter: Payer: Self-pay | Admitting: Internal Medicine

## 2017-03-12 NOTE — Telephone Encounter (Signed)
Otila Kluver from [facility] called and wanted to know if patient's medical clearance was done yet. Please advise, thank you!  Call Tina @ [phone] Fax # [phone]

## 2017-03-12 NOTE — Assessment & Plan Note (Signed)
Blood pressure on my check a little elevated.  She will spot check her pressure and call in readings over the next 2 days.  Will need close intra op and post op monitoring of her blood pressure and heart rate to avoid extremes.

## 2017-03-12 NOTE — Assessment & Plan Note (Signed)
Persistent pain and swelling. Planning for knee surgery next week.  She reports no chest pain.  No sob.  EKG - SR with no acute ischemic changes.  I feel she is at low risk from a cardiac standpoint to proceed with the planned surgery.  Blood pressure was slightly increased on recheck today.  Will have her spot check her pressure.  She will need close intra op and post op monitoring of her heart rate and blood pressure to avoid extremes.  She will d/w surgery regarding medications that need to be held.

## 2017-03-12 NOTE — Telephone Encounter (Signed)
Did you give this to me yet?

## 2017-03-12 NOTE — Assessment & Plan Note (Signed)
Currently doing well.  Stable.  On sulfasalazine.   Will d/w surgery meds that need to be stopped prior to surgery.

## 2017-03-13 NOTE — Telephone Encounter (Signed)
Form completed and placed in box.  Please inform them we repeated her met b since her sodium was 150.  I did not do a urinalysis.  She  Had cbc and liver panel in 12/2016.  Can send results of repeat met b and labs from 12/2016.  Thanks.

## 2017-03-13 NOTE — Telephone Encounter (Signed)
All faxed to office.

## 2017-03-16 DIAGNOSIS — Z9181 History of falling: Secondary | ICD-10-CM | POA: Diagnosis not present

## 2017-03-16 DIAGNOSIS — R0689 Other abnormalities of breathing: Secondary | ICD-10-CM | POA: Diagnosis not present

## 2017-03-16 DIAGNOSIS — R110 Nausea: Secondary | ICD-10-CM | POA: Diagnosis not present

## 2017-03-16 DIAGNOSIS — G894 Chronic pain syndrome: Secondary | ICD-10-CM | POA: Diagnosis not present

## 2017-03-16 DIAGNOSIS — M25561 Pain in right knee: Secondary | ICD-10-CM | POA: Diagnosis not present

## 2017-03-16 DIAGNOSIS — T8453XA Infection and inflammatory reaction due to internal right knee prosthesis, initial encounter: Secondary | ICD-10-CM | POA: Diagnosis not present

## 2017-03-16 DIAGNOSIS — Z7982 Long term (current) use of aspirin: Secondary | ICD-10-CM | POA: Diagnosis not present

## 2017-03-16 DIAGNOSIS — I10 Essential (primary) hypertension: Secondary | ICD-10-CM | POA: Diagnosis not present

## 2017-03-16 DIAGNOSIS — Z96651 Presence of right artificial knee joint: Secondary | ICD-10-CM | POA: Diagnosis not present

## 2017-03-16 DIAGNOSIS — Z87891 Personal history of nicotine dependence: Secondary | ICD-10-CM | POA: Diagnosis not present

## 2017-03-16 DIAGNOSIS — M6281 Muscle weakness (generalized): Secondary | ICD-10-CM | POA: Diagnosis not present

## 2017-03-16 DIAGNOSIS — H409 Unspecified glaucoma: Secondary | ICD-10-CM | POA: Diagnosis not present

## 2017-03-16 DIAGNOSIS — T8484XA Pain due to internal orthopedic prosthetic devices, implants and grafts, initial encounter: Secondary | ICD-10-CM | POA: Diagnosis not present

## 2017-03-16 DIAGNOSIS — R2689 Other abnormalities of gait and mobility: Secondary | ICD-10-CM | POA: Diagnosis not present

## 2017-03-16 DIAGNOSIS — R001 Bradycardia, unspecified: Secondary | ICD-10-CM | POA: Diagnosis not present

## 2017-03-16 DIAGNOSIS — R509 Fever, unspecified: Secondary | ICD-10-CM | POA: Diagnosis not present

## 2017-03-16 DIAGNOSIS — K5090 Crohn's disease, unspecified, without complications: Secondary | ICD-10-CM | POA: Diagnosis not present

## 2017-03-16 DIAGNOSIS — T84032D Mechanical loosening of internal right knee prosthetic joint, subsequent encounter: Secondary | ICD-10-CM | POA: Diagnosis not present

## 2017-03-16 DIAGNOSIS — R0902 Hypoxemia: Secondary | ICD-10-CM | POA: Diagnosis not present

## 2017-03-16 DIAGNOSIS — D72829 Elevated white blood cell count, unspecified: Secondary | ICD-10-CM | POA: Diagnosis not present

## 2017-03-18 ENCOUNTER — Encounter
Admission: RE | Admit: 2017-03-18 | Discharge: 2017-03-18 | Disposition: A | Discharge status: Home / Self Care | Payer: Medicare Other | Source: Ambulatory Visit | Attending: Internal Medicine | Admitting: Internal Medicine

## 2017-03-19 DIAGNOSIS — T84032D Mechanical loosening of internal right knee prosthetic joint, subsequent encounter: Secondary | ICD-10-CM | POA: Diagnosis not present

## 2017-03-19 DIAGNOSIS — Z96651 Presence of right artificial knee joint: Secondary | ICD-10-CM | POA: Diagnosis not present

## 2017-03-19 DIAGNOSIS — M1711 Unilateral primary osteoarthritis, right knee: Secondary | ICD-10-CM | POA: Diagnosis not present

## 2017-03-19 DIAGNOSIS — K5010 Crohn's disease of large intestine without complications: Secondary | ICD-10-CM | POA: Diagnosis not present

## 2017-03-19 DIAGNOSIS — S80221A Blister (nonthermal), right knee, initial encounter: Secondary | ICD-10-CM | POA: Diagnosis not present

## 2017-03-19 DIAGNOSIS — H409 Unspecified glaucoma: Secondary | ICD-10-CM | POA: Diagnosis not present

## 2017-03-19 DIAGNOSIS — I10 Essential (primary) hypertension: Secondary | ICD-10-CM | POA: Diagnosis not present

## 2017-03-19 DIAGNOSIS — R2689 Other abnormalities of gait and mobility: Secondary | ICD-10-CM | POA: Diagnosis not present

## 2017-03-19 DIAGNOSIS — G894 Chronic pain syndrome: Secondary | ICD-10-CM | POA: Diagnosis not present

## 2017-03-19 DIAGNOSIS — Z9181 History of falling: Secondary | ICD-10-CM | POA: Diagnosis not present

## 2017-03-19 DIAGNOSIS — K5090 Crohn's disease, unspecified, without complications: Secondary | ICD-10-CM | POA: Diagnosis not present

## 2017-03-19 DIAGNOSIS — Z87891 Personal history of nicotine dependence: Secondary | ICD-10-CM | POA: Diagnosis not present

## 2017-03-19 DIAGNOSIS — M6281 Muscle weakness (generalized): Secondary | ICD-10-CM | POA: Diagnosis not present

## 2017-03-20 ENCOUNTER — Other Ambulatory Visit: Payer: Self-pay

## 2017-03-20 ENCOUNTER — Non-Acute Institutional Stay (SKILLED_NURSING_FACILITY): Payer: Medicare Other | Admitting: Gerontology

## 2017-03-20 DIAGNOSIS — M1711 Unilateral primary osteoarthritis, right knee: Secondary | ICD-10-CM

## 2017-03-20 DIAGNOSIS — Z96651 Presence of right artificial knee joint: Secondary | ICD-10-CM | POA: Diagnosis not present

## 2017-03-20 MED ORDER — HYDROMORPHONE HCL 2 MG PO TABS: 2.0000 mg | ORAL_TABLET | ORAL | 0 refills | Status: DC | PRN

## 2017-03-20 NOTE — Telephone Encounter (Signed)
Rx sent to [facility] phone : [phone] , fax : [phone]

## 2017-03-23 DIAGNOSIS — M1711 Unilateral primary osteoarthritis, right knee: Secondary | ICD-10-CM | POA: Insufficient documentation

## 2017-03-23 DIAGNOSIS — K5010 Crohn's disease of large intestine without complications: Secondary | ICD-10-CM | POA: Diagnosis not present

## 2017-03-27 ENCOUNTER — Encounter: Payer: Self-pay | Admitting: Gerontology

## 2017-03-27 ENCOUNTER — Non-Acute Institutional Stay (SKILLED_NURSING_FACILITY): Payer: Medicare Other | Admitting: Gerontology

## 2017-03-27 DIAGNOSIS — M1711 Unilateral primary osteoarthritis, right knee: Secondary | ICD-10-CM | POA: Diagnosis not present

## 2017-03-27 DIAGNOSIS — Z96651 Presence of right artificial knee joint: Secondary | ICD-10-CM | POA: Diagnosis not present

## 2017-03-27 DIAGNOSIS — S80221A Blister (nonthermal), right knee, initial encounter: Secondary | ICD-10-CM | POA: Diagnosis not present

## 2017-03-27 NOTE — Progress Notes (Signed)
Location:   [place] Room Number: 157W Place of Service:  SNF [phone]) Provider:  Toni Arthurs, NP-C  Einar Pheasant, MD  Patient Care Team: Einar Pheasant, MD as PCP - General (Internal Medicine)  Extended Emergency Contact Information Primary Emergency Contact: Wilhemina Cash Address: [address]          [place], [place] [address] Johnnette Litter of [place] Phone: [phone] Mobile Phone: [phone] Relation: None Secondary Emergency Contact: Verdell Face Address: [address], [place] [address] Home Phone: [phone] Relation: None  Code Status:  FULL Goals of care: Advanced Directive information Advanced Directives 03/27/2017  Does Patient Have a Medical Advance Directive? No  Would patient like information on creating a medical advance directive? -     Chief Complaint  Patient presents with  . Medical Management of Chronic Issues    Routine Visit    HPI:  Pt is a 71 y.o. female seen today for follow up. Pt was admitted to the facility for rehab following hospitalization for Hardware loosening and subsequent Right Total Knee Revision. Pt is participating in PT and OT. Pt reports her pain is fairly well controlled. She is eating well and having regular BMs. Pt denies n/v/d/f/c/cp/sob/ha/abd pain/dizziness/cough. She does have decreased ROM of the knee, only ~60% flexion. Reports her knee feels stiff. Discussion had with PT about the decreased ROM, we discussed option of CPM machine. Pt is to f/u with Orthopedist on Monday. Will wait to see Ortho input on decreased ROM. Pt also has large, burst blisters on the peri-incisional tissue. Proximal to the incision. This is painful to the pt. No bleeding or drainage. Peri-blister tissue intact. VSS. No other complaints.    Past Medical History:  Diagnosis Date  . Anemia    reslved  . Colon polyp 2009  . Crohn's disease ([place])   . Diverticulosis   . Fibrocystic breast  disease   . Herniated nucleus pulposus of lumbosacral region   . History of chicken pox   . Hypercholesterolemia   . Hyperlipidemia   . Hypertension   . Osteoarthritis   . Pulmonary nodule seen on imaging study 2004   resolved 2007   Past Surgical History:  Procedure Laterality Date  . anterior cervical discectomy and fusion  1998   Dr Rolin Barry  . BACK SURGERY  1992   ruptured disc (Dr Mauri Pole)  . BREAST BIOPSY Left    neg-no scar seen  . COLECTOMY  2003  . COLON SURGERY  1998  . COLONOSCOPY  2009  . COLONOSCOPY W/ BIOPSIES  11/30/2012   Procedure: COLONOSCOPY W/BIOPSY; Surgeon: Colvin Caroli, MD; Location: [facility]; Service: Gastroenterology;;  . COLONOSCOPY W/ BIOPSIES  04/15/2016   Procedure: Colonoscopy ; Surgeon: Colvin Caroli, MD; Location: [facility]; Service: Gastroenterology; Laterality: N/A;  . KNEE ARTHROSCOPY W/ SYNOVECTOMY Right 03/10/2014   limited  . KNEE SURGERY Right 1/16   [facility] (Dr. Sherlynn Carbon)  . POSTERIOR LAMINECTOMY / DECOMPRESSION LUMBAR SPINE  1999  . REPLACEMENT TOTAL KNEE    . ROTATOR CUFF REPAIR Left 2007   acute open; accident    Allergies  Allergen Reactions  . Mercaptopurine Nausea Only    Other reaction(s): Other (See Comments) Other Reaction: Other reaction  . Lyrica [Pregabalin]     Hand swelling  . Dilaudid [Hydromorphone Hcl] Rash    Allergies as of 03/27/2017  Reactions   Mercaptopurine Nausea Only   Other reaction(s): Other (See Comments) Other Reaction: Other reaction   Lyrica [pregabalin]    Hand swelling   Dilaudid [hydromorphone Hcl] Rash      Medication List       Accurate as of 03/27/17  3:48 PM. Always use your most recent med list.          acetaminophen 325 MG tablet Commonly known as:  TYLENOL Take 650 mg by mouth 4 (four) times daily.   amLODipine 5 MG tablet Commonly known as:  NORVASC Take 1 tablet (5 mg total) by mouth daily.   aspirin 325 MG EC tablet Take  325 mg by mouth 2 (two) times daily.   atenolol 50 MG tablet Commonly known as:  TENORMIN TAKE 1 TABLET (50 MG TOTAL) BY MOUTH DAILY.   brimonidine 0.2 % ophthalmic solution Commonly known as:  ALPHAGAN 1 drop 2 (two) times daily.   celecoxib 200 MG capsule Commonly known as:  CELEBREX Take 200 mg by mouth daily.   docusate sodium 100 MG capsule Commonly known as:  COLACE Take 200 mg by mouth daily.   HYDROmorphone 2 MG tablet Commonly known as:  DILAUDID Take 1-2 tablets (2-4 mg total) by mouth every 3 (three) hours as needed. 1-2 tablets (97m - 4 mg)   latanoprost 0.005 % ophthalmic solution Commonly known as:  XALATAN Place 1 drop into both eyes at bedtime.   lisinopril 40 MG tablet Commonly known as:  PRINIVIL,ZESTRIL TAKE 1 TABLET (40 MG TOTAL) BY MOUTH DAILY.   methocarbamol 500 MG tablet Commonly known as:  ROBAXIN Take 500 mg by mouth 3 (three) times daily.   ondansetron 4 MG tablet Commonly known as:  ZOFRAN Take 4 mg by mouth every 6 (six) hours as needed for nausea or vomiting.   promethazine 12.5 MG tablet Commonly known as:  PHENERGAN Take 12.5 mg by mouth every 6 (six) hours as needed for nausea or vomiting.   sulfaSALAzine 500 MG tablet Commonly known as:  AZULFIDINE Take 1,500 mg by mouth 2 (two) times daily. 3 tabs   timolol 0.5 % ophthalmic solution Commonly known as:  BETIMOL 1 drop 2 (two) times daily.       Review of Systems  Constitutional: Negative for activity change, appetite change, chills, diaphoresis and fever.  HENT: Negative for congestion, sneezing, sore throat, trouble swallowing and voice change.   Respiratory: Negative for apnea, cough, choking, chest tightness, shortness of breath and wheezing.   Cardiovascular: Negative for chest pain, palpitations and leg swelling.  Gastrointestinal: Negative for abdominal distention, abdominal pain, constipation, diarrhea and nausea.  Genitourinary: Negative for difficulty urinating,  dysuria, frequency and urgency.  Musculoskeletal: Positive for arthralgias (typical arthritis) and joint swelling. Negative for back pain, gait problem and myalgias.  Skin: Positive for wound. Negative for color change, pallor and rash.  Neurological: Negative for dizziness, tremors, syncope, speech difficulty, weakness, numbness and headaches.  Psychiatric/Behavioral: Negative for agitation and behavioral problems.  All other systems reviewed and are negative.   Immunization History  Administered Date(s) Administered  . Influenza Split 05/13/2011, 08/12/2012, 04/29/2014  . Influenza, High Dose Seasonal PF 07/14/2016  . Influenza, Seasonal, Injecte, Preservative Fre 06/05/2014  . Influenza,inj,Quad PF,36+ Mos 05/06/2013, 06/08/2015  . Pneumococcal Conjugate-13 07/21/2016  . Pneumococcal Polysaccharide-23 09/01/2014   Pertinent  Health Maintenance Due  Topic Date Due  . INFLUENZA VACCINE  05/12/2017 (Originally 03/11/2017)  . MAMMOGRAM  05/05/2017  . PNA vac Low Risk Adult (  2 of 2 - PPSV23) 07/21/2017  . COLONOSCOPY  12/01/2022  . DEXA SCAN  Completed   Fall Risk  07/21/2016 07/14/2016 07/02/2016 04/28/2016 07/20/2015  Falls in the past year? No No No No No   Functional Status Survey:    Vitals:   03/27/17 1522  BP: 117/69  Pulse: 76  Resp: 18  Temp: 98 F (36.7 C)  SpO2: 97%  Weight: 159 lb 8 oz (72.3 kg)  Height: 5' 5"  (1.651 m)   Body mass index is 26.54 kg/m. Physical Exam  Constitutional: She is oriented to person, place, and time. Vital signs are normal. She appears well-developed and well-nourished. She is active and cooperative. She does not appear ill. No distress.  HENT:  Head: Normocephalic and atraumatic.  Mouth/Throat: Uvula is midline, oropharynx is clear and moist and mucous membranes are normal. Mucous membranes are not pale, not dry and not cyanotic.  Eyes: Pupils are equal, round, and reactive to light. Conjunctivae, EOM and lids are normal.  Neck:  Trachea normal, normal range of motion and full passive range of motion without pain. Neck supple. No JVD present. No tracheal deviation, no edema and no erythema present. No thyromegaly present.  Cardiovascular: Normal rate, regular rhythm, normal heart sounds, intact distal pulses and normal pulses.  Exam reveals no gallop, no distant heart sounds and no friction rub.   No murmur heard. Pulmonary/Chest: Effort normal and breath sounds normal. No accessory muscle usage. No respiratory distress. She has no wheezes. She has no rales. She exhibits no tenderness.  Abdominal: Normal appearance and bowel sounds are normal. She exhibits no distension and no ascites. There is no tenderness.  Musculoskeletal: She exhibits no edema or tenderness.       Right knee: She exhibits decreased range of motion and laceration.  Expected osteoarthritis, stiffness, Calves soft, supple. Negative Bevelyn Buckles' sign  Neurological: She is alert and oriented to person, place, and time. She has normal strength.  Skin: Skin is warm and dry. Laceration (Right knee) noted. No rash noted. She is not diaphoretic. No cyanosis. No pallor. Nails show no clubbing.     Psychiatric: She has a normal mood and affect. Her speech is normal and behavior is normal. Judgment and thought content normal. Cognition and memory are normal.  Nursing note and vitals reviewed.   Labs reviewed:  Recent Labs  07/14/16 0831 12/30/16 0905 03/10/17 1638  NA 142 143 144  K 4.0 3.8 3.8  CL 107 107 108  CO2 29 29 29   GLUCOSE 108* 97 88  BUN 14 15 17   CREATININE 0.84 0.83 0.83  CALCIUM 9.5 9.3 9.6    Recent Labs  07/14/16 0831 12/30/16 0905  AST 20 18  ALT 13 12  ALKPHOS 88 74  BILITOT 0.3 0.3  PROT 7.0 6.8  ALBUMIN 4.3 4.1    Recent Labs  07/14/16 0831 12/30/16 0905 01/06/17 1215  WBC 11.7* 12.2* 10.4  NEUTROABS 8.8* 7.4 7.2  HGB 13.1 12.5 12.6  HCT 39.1 38.0 37.9  MCV 93.9 96.7 96.7  PLT 212.0 222.0 201.0   Lab Results    Component Value Date   TSH 2.05 12/30/2016   Lab Results  Component Value Date   HGBA1C 5.7 10/19/2012   Lab Results  Component Value Date   CHOL 207 (H) 12/30/2016   HDL 62.60 12/30/2016   LDLCALC 115 (H) 12/30/2016   LDLDIRECT 132.5 05/06/2013   TRIG 149.0 12/30/2016   CHOLHDL 3 12/30/2016    Significant Diagnostic  Results in last 30 days:  No results found.  Assessment/Plan 1. Osteoarthritis of right knee, unspecified osteoarthritis type 2. S/P revision of total knee, right  Continue PT/OT  Continue exercises as taught by PT/OT  Continue ASA 325 mg po BID for DVT prophylaxis  Polar care to the knee at all times when at rest  Continue APAP 650 mg po Q 4 hours prn  Continue Hydromorphone 2-4 mg PO Q 3 hours prn pain  Continue Celebrex 200 mg po Q Day  Skin care/ wound care per protocol  3. Blister of knee, right, initial encounter  Keflex 500 mg po Q 6 hours x 7 days  Silvadene cream to blisters BID, cover with Allevyn dressing   Family/ staff Communication:   Total Time:  Documentation:  Face to Face:  Family/Phone:   Labs/tests ordered:    Medication list reviewed and assessed for continued appropriateness. Monthly medication orders reviewed and signed.  Vikki Ports, NP-C Geriatrics [facility] [phone], [place] [address] Cell Phone (Mon-Fri 8am-5pm):  [phone] On Call:  [phone] & follow prompts after 5pm & weekends Office Phone:  [phone] Office Fax:  [phone]

## 2017-04-01 DIAGNOSIS — Z96651 Presence of right artificial knee joint: Secondary | ICD-10-CM | POA: Insufficient documentation

## 2017-04-01 DIAGNOSIS — M1710 Unilateral primary osteoarthritis, unspecified knee: Secondary | ICD-10-CM | POA: Insufficient documentation

## 2017-04-01 NOTE — Progress Notes (Signed)
Location:      Place of Service:  SNF (31) Provider:  Toni Arthurs, NP-C  Einar Pheasant, MD  Patient Care Team: Einar Pheasant, MD as PCP - General (Internal Medicine)  Extended Emergency Contact Information Primary Emergency Contact: Wilhemina Cash Address: [address]          [place], [place] [address] Johnnette Litter of [place] Phone: [phone] Mobile Phone: [phone] Relation: None Secondary Emergency Contact: Verdell Face Address: [address]          [place], [place] [address] Home Phone: [phone] Relation: None  Code Status:  Full Goals of care: Advanced Directive information Advanced Directives 03/27/2017  Does Patient Have a Medical Advance Directive? No  Would patient like information on creating a medical advance directive? -     Chief Complaint  Patient presents with  . Follow-up    HPI:  Pt is a 71 y.o. female seen today for follow up. Pt was admitted to the facility for rehab following hospitalization for Hardware loosening and subsequent Right Total Knee Revision. Pt is participating in PT and OT. Pt reports her pain is fairly well controlled. She is eating well and having regular BMs. Pt denies n/v/d/f/c/cp/sob/ha/abd pain/dizziness/cough. VSS. No other complaints.      Past Medical History:  Diagnosis Date  . Anemia    reslved  . Colon polyp 2009  . Crohn's disease ([place])   . Diverticulosis   . Fibrocystic breast disease   . Herniated nucleus pulposus of lumbosacral region   . History of chicken pox   . Hypercholesterolemia   . Hyperlipidemia   . Hypertension   . Osteoarthritis   . Pulmonary nodule seen on imaging study 2004   resolved 2007   Past Surgical History:  Procedure Laterality Date  . anterior cervical discectomy and fusion  1998   Dr Rolin Barry  . BACK SURGERY  1992   ruptured disc (Dr Mauri Pole)  . BREAST BIOPSY Left    neg-no scar seen  . COLECTOMY  2003  . COLON SURGERY  1998  . COLONOSCOPY  2009  .  COLONOSCOPY W/ BIOPSIES  11/30/2012   Procedure: COLONOSCOPY W/BIOPSY; Surgeon: Colvin Caroli, MD; Location: [facility]; Service: Gastroenterology;;  . COLONOSCOPY W/ BIOPSIES  04/15/2016   Procedure: Colonoscopy ; Surgeon: Colvin Caroli, MD; Location: [facility]; Service: Gastroenterology; Laterality: N/A;  . KNEE ARTHROSCOPY W/ SYNOVECTOMY Right 03/10/2014   limited  . KNEE SURGERY Right 1/16   [facility] (Dr. Sherlynn Carbon)  . POSTERIOR LAMINECTOMY / DECOMPRESSION LUMBAR SPINE  1999  . REPLACEMENT TOTAL KNEE    . ROTATOR CUFF REPAIR Left 2007   acute open; accident    Allergies  Allergen Reactions  . Mercaptopurine Nausea Only    Other reaction(s): Other (See Comments) Other Reaction: Other reaction  . Lyrica [Pregabalin]     Hand swelling  . Dilaudid [Hydromorphone Hcl] Rash    Allergies as of 03/20/2017      Reactions   Mercaptopurine Nausea Only   Other reaction(s): Other (See Comments) Other Reaction: Other reaction   Lyrica [pregabalin]    Hand swelling   Dilaudid [hydromorphone Hcl] Rash      Medication List       Accurate as of 03/20/17 11:59 PM. Always use your most recent med list.          acetaminophen 325 MG tablet Commonly known as:  TYLENOL Take 650 mg by mouth 4 (four) times daily.  amLODipine 5 MG tablet Commonly known as:  NORVASC Take 1 tablet (5 mg total) by mouth daily.   aspirin 325 MG EC tablet Take 325 mg by mouth 2 (two) times daily.   atenolol 50 MG tablet Commonly known as:  TENORMIN TAKE 1 TABLET (50 MG TOTAL) BY MOUTH DAILY.   brimonidine 0.2 % ophthalmic solution Commonly known as:  ALPHAGAN 1 drop 2 (two) times daily.   celecoxib 200 MG capsule Commonly known as:  CELEBREX Take 200 mg by mouth daily.   docusate sodium 100 MG capsule Commonly known as:  COLACE Take 200 mg by mouth daily.   HYDROmorphone 2 MG tablet Commonly known as:  DILAUDID Take 1-2 tablets (2-4 mg total) by mouth  every 3 (three) hours as needed. 1-2 tablets (59m - 4 mg)   latanoprost 0.005 % ophthalmic solution Commonly known as:  XALATAN Place 1 drop into both eyes at bedtime.   lisinopril 40 MG tablet Commonly known as:  PRINIVIL,ZESTRIL TAKE 1 TABLET (40 MG TOTAL) BY MOUTH DAILY.   promethazine 12.5 MG tablet Commonly known as:  PHENERGAN Take 12.5 mg by mouth every 6 (six) hours as needed for nausea or vomiting.   sulfaSALAzine 500 MG tablet Commonly known as:  AZULFIDINE Take 1,500 mg by mouth 2 (two) times daily. 3 tabs   timolol 0.5 % ophthalmic solution Commonly known as:  BETIMOL 1 drop 2 (two) times daily.       Review of Systems  Constitutional: Negative for activity change, appetite change, chills, diaphoresis and fever.  HENT: Negative for congestion, sneezing, sore throat, trouble swallowing and voice change.   Respiratory: Negative for apnea, cough, choking, chest tightness, shortness of breath and wheezing.   Cardiovascular: Negative for chest pain, palpitations and leg swelling.  Gastrointestinal: Negative for abdominal distention, abdominal pain, constipation, diarrhea and nausea.  Genitourinary: Negative for difficulty urinating, dysuria, frequency and urgency.  Musculoskeletal: Positive for arthralgias (typical arthritis) and joint swelling. Negative for back pain, gait problem and myalgias.  Skin: Positive for wound. Negative for color change, pallor and rash.  Neurological: Negative for dizziness, tremors, syncope, speech difficulty, weakness, numbness and headaches.  Psychiatric/Behavioral: Negative for agitation and behavioral problems.  All other systems reviewed and are negative.   Immunization History  Administered Date(s) Administered  . Influenza Split 05/13/2011, 08/12/2012, 04/29/2014  . Influenza, High Dose Seasonal PF 07/14/2016  . Influenza, Seasonal, Injecte, Preservative Fre 06/05/2014  . Influenza,inj,Quad PF,6+ Mos 05/06/2013, 06/08/2015  .  Pneumococcal Conjugate-13 07/21/2016  . Pneumococcal Polysaccharide-23 09/01/2014   Pertinent  Health Maintenance Due  Topic Date Due  . INFLUENZA VACCINE  05/12/2017 (Originally 03/11/2017)  . MAMMOGRAM  05/05/2017  . COLONOSCOPY  12/01/2022  . DEXA SCAN  Completed  . PNA vac Low Risk Adult  Completed   Fall Risk  07/21/2016 07/14/2016 07/02/2016 04/28/2016 07/20/2015  Falls in the past year? No No No No No   Functional Status Survey:    Vitals:   03/20/17 0915  BP: (!) 141/84  Pulse: 99  Resp: 18  Temp: (!) 96.9 F (36.1 C)  SpO2: 97%  Weight: 159 lb 8 oz (72.3 kg)   Body mass index is 26.54 kg/m. Physical Exam  Constitutional: She is oriented to person, place, and time. Vital signs are normal. She appears well-developed and well-nourished. She is active and cooperative. She does not appear ill. No distress.  HENT:  Head: Normocephalic and atraumatic.  Mouth/Throat: Uvula is midline, oropharynx is clear and moist and  mucous membranes are normal. Mucous membranes are not pale, not dry and not cyanotic.  Eyes: Pupils are equal, round, and reactive to light. Conjunctivae, EOM and lids are normal.  Neck: Trachea normal, normal range of motion and full passive range of motion without pain. Neck supple. No JVD present. No tracheal deviation, no edema and no erythema present. No thyromegaly present.  Cardiovascular: Normal rate, regular rhythm, normal heart sounds, intact distal pulses and normal pulses.  Exam reveals no gallop, no distant heart sounds and no friction rub.   No murmur heard. Pulses:      Dorsalis pedis pulses are 2+ on the right side, and 2+ on the left side.  No edema  Pulmonary/Chest: Effort normal. No accessory muscle usage. No respiratory distress. She has no decreased breath sounds. She has no wheezes. She has no rhonchi. She has no rales. She exhibits no tenderness.  Abdominal: Normal appearance and bowel sounds are normal. She exhibits no distension and no  ascites. There is no tenderness.  Musculoskeletal: She exhibits no edema or tenderness.       Right knee: She exhibits decreased range of motion, swelling and laceration.  Expected osteoarthritis, stiffness; Calves soft, supple. Negative Homan's sign  Neurological: She is alert and oriented to person, place, and time. She has normal strength.  Skin: Skin is warm and dry. Laceration (Right knee incision) noted. No rash noted. She is not diaphoretic. No cyanosis or erythema. No pallor. Nails show no clubbing.  Psychiatric: She has a normal mood and affect. Her speech is normal and behavior is normal. Judgment and thought content normal. Cognition and memory are normal.  Nursing note and vitals reviewed.   Labs reviewed:  Recent Labs  07/14/16 0831 12/30/16 0905 03/10/17 1638  NA 142 143 144  K 4.0 3.8 3.8  CL 107 107 108  CO2 29 29 29   GLUCOSE 108* 97 88  BUN 14 15 17   CREATININE 0.84 0.83 0.83  CALCIUM 9.5 9.3 9.6    Recent Labs  07/14/16 0831 12/30/16 0905  AST 20 18  ALT 13 12  ALKPHOS 88 74  BILITOT 0.3 0.3  PROT 7.0 6.8  ALBUMIN 4.3 4.1    Recent Labs  07/14/16 0831 12/30/16 0905 01/06/17 1215  WBC 11.7* 12.2* 10.4  NEUTROABS 8.8* 7.4 7.2  HGB 13.1 12.5 12.6  HCT 39.1 38.0 37.9  MCV 93.9 96.7 96.7  PLT 212.0 222.0 201.0   Lab Results  Component Value Date   TSH 2.05 12/30/2016   Lab Results  Component Value Date   HGBA1C 5.7 10/19/2012   Lab Results  Component Value Date   CHOL 207 (H) 12/30/2016   HDL 62.60 12/30/2016   LDLCALC 115 (H) 12/30/2016   LDLDIRECT 132.5 05/06/2013   TRIG 149.0 12/30/2016   CHOLHDL 3 12/30/2016    Significant Diagnostic Results in last 30 days:  No results found.  Assessment/Plan 1. Osteoarthritis of right knee, unspecified osteoarthritis type 2. S/P revision of total knee, right  Continue PT/OT  Continue exercises as taught by PT/OT  Continue ASA 325 mg po BID for DVT prophylaxis  Polar care to the knee  at all times when at rest  Continue APAP 650 mg po Q 4 hours prn  Continue Hydromorphone 2-4 mg PO Q 3 hours prn pain  Continue Celebrex 200 mg po Q Day  Skin care/ wound care per protocol   Family/ staff Communication:   Total Time:  Documentation:  Face to Face:  Family/Phone:  Labs/tests ordered:    Medication list reviewed and assessed for continued appropriateness. Monthly medication orders reviewed and signed.  Vikki Ports, NP-C Geriatrics [facility] [phone],  [address] Cell Phone (Mon-Fri 8am-5pm):  [phone] On Call:  [phone] & follow prompts after 5pm & weekends Office Phone:  [phone] Office Fax:  [phone]

## 2017-04-10 DIAGNOSIS — Z9181 History of falling: Secondary | ICD-10-CM | POA: Diagnosis not present

## 2017-04-10 DIAGNOSIS — I10 Essential (primary) hypertension: Secondary | ICD-10-CM | POA: Diagnosis not present

## 2017-04-10 DIAGNOSIS — H409 Unspecified glaucoma: Secondary | ICD-10-CM | POA: Diagnosis not present

## 2017-04-10 DIAGNOSIS — M1991 Primary osteoarthritis, unspecified site: Secondary | ICD-10-CM | POA: Diagnosis not present

## 2017-04-10 DIAGNOSIS — S8991XD Unspecified injury of right lower leg, subsequent encounter: Secondary | ICD-10-CM | POA: Diagnosis not present

## 2017-04-10 DIAGNOSIS — Z87891 Personal history of nicotine dependence: Secondary | ICD-10-CM | POA: Diagnosis not present

## 2017-04-10 DIAGNOSIS — K5090 Crohn's disease, unspecified, without complications: Secondary | ICD-10-CM | POA: Diagnosis not present

## 2017-04-10 DIAGNOSIS — T84032D Mechanical loosening of internal right knee prosthetic joint, subsequent encounter: Secondary | ICD-10-CM | POA: Diagnosis not present

## 2017-04-11 ENCOUNTER — Encounter: Admission: RE | Admit: 2017-04-11 | Payer: Medicare Other | Source: Ambulatory Visit | Admitting: Internal Medicine

## 2017-04-15 DIAGNOSIS — K5090 Crohn's disease, unspecified, without complications: Secondary | ICD-10-CM | POA: Diagnosis not present

## 2017-04-15 DIAGNOSIS — Z9181 History of falling: Secondary | ICD-10-CM | POA: Diagnosis not present

## 2017-04-15 DIAGNOSIS — H409 Unspecified glaucoma: Secondary | ICD-10-CM | POA: Diagnosis not present

## 2017-04-15 DIAGNOSIS — T84032D Mechanical loosening of internal right knee prosthetic joint, subsequent encounter: Secondary | ICD-10-CM | POA: Diagnosis not present

## 2017-04-15 DIAGNOSIS — M1991 Primary osteoarthritis, unspecified site: Secondary | ICD-10-CM | POA: Diagnosis not present

## 2017-04-15 DIAGNOSIS — Z87891 Personal history of nicotine dependence: Secondary | ICD-10-CM | POA: Diagnosis not present

## 2017-04-15 DIAGNOSIS — I10 Essential (primary) hypertension: Secondary | ICD-10-CM | POA: Diagnosis not present

## 2017-04-15 DIAGNOSIS — S8991XD Unspecified injury of right lower leg, subsequent encounter: Secondary | ICD-10-CM | POA: Diagnosis not present

## 2017-04-17 DIAGNOSIS — T84032D Mechanical loosening of internal right knee prosthetic joint, subsequent encounter: Secondary | ICD-10-CM | POA: Diagnosis not present

## 2017-04-17 DIAGNOSIS — M1991 Primary osteoarthritis, unspecified site: Secondary | ICD-10-CM | POA: Diagnosis not present

## 2017-04-17 DIAGNOSIS — I10 Essential (primary) hypertension: Secondary | ICD-10-CM | POA: Diagnosis not present

## 2017-04-17 DIAGNOSIS — S8991XD Unspecified injury of right lower leg, subsequent encounter: Secondary | ICD-10-CM | POA: Diagnosis not present

## 2017-04-17 DIAGNOSIS — Z9181 History of falling: Secondary | ICD-10-CM | POA: Diagnosis not present

## 2017-04-17 DIAGNOSIS — H409 Unspecified glaucoma: Secondary | ICD-10-CM | POA: Diagnosis not present

## 2017-04-17 DIAGNOSIS — Z87891 Personal history of nicotine dependence: Secondary | ICD-10-CM | POA: Diagnosis not present

## 2017-04-17 DIAGNOSIS — K5090 Crohn's disease, unspecified, without complications: Secondary | ICD-10-CM | POA: Diagnosis not present

## 2017-04-20 DIAGNOSIS — S8991XD Unspecified injury of right lower leg, subsequent encounter: Secondary | ICD-10-CM | POA: Diagnosis not present

## 2017-04-20 DIAGNOSIS — H409 Unspecified glaucoma: Secondary | ICD-10-CM | POA: Diagnosis not present

## 2017-04-20 DIAGNOSIS — T84032D Mechanical loosening of internal right knee prosthetic joint, subsequent encounter: Secondary | ICD-10-CM | POA: Diagnosis not present

## 2017-04-20 DIAGNOSIS — Z9181 History of falling: Secondary | ICD-10-CM | POA: Diagnosis not present

## 2017-04-20 DIAGNOSIS — M1991 Primary osteoarthritis, unspecified site: Secondary | ICD-10-CM | POA: Diagnosis not present

## 2017-04-20 DIAGNOSIS — I10 Essential (primary) hypertension: Secondary | ICD-10-CM | POA: Diagnosis not present

## 2017-04-20 DIAGNOSIS — K5090 Crohn's disease, unspecified, without complications: Secondary | ICD-10-CM | POA: Diagnosis not present

## 2017-04-20 DIAGNOSIS — Z87891 Personal history of nicotine dependence: Secondary | ICD-10-CM | POA: Diagnosis not present

## 2017-04-21 DIAGNOSIS — M1991 Primary osteoarthritis, unspecified site: Secondary | ICD-10-CM | POA: Diagnosis not present

## 2017-04-21 DIAGNOSIS — I10 Essential (primary) hypertension: Secondary | ICD-10-CM | POA: Diagnosis not present

## 2017-04-21 DIAGNOSIS — Z9181 History of falling: Secondary | ICD-10-CM | POA: Diagnosis not present

## 2017-04-21 DIAGNOSIS — S8991XD Unspecified injury of right lower leg, subsequent encounter: Secondary | ICD-10-CM | POA: Diagnosis not present

## 2017-04-21 DIAGNOSIS — T84032D Mechanical loosening of internal right knee prosthetic joint, subsequent encounter: Secondary | ICD-10-CM | POA: Diagnosis not present

## 2017-04-21 DIAGNOSIS — H409 Unspecified glaucoma: Secondary | ICD-10-CM | POA: Diagnosis not present

## 2017-04-21 DIAGNOSIS — Z87891 Personal history of nicotine dependence: Secondary | ICD-10-CM | POA: Diagnosis not present

## 2017-04-21 DIAGNOSIS — K5090 Crohn's disease, unspecified, without complications: Secondary | ICD-10-CM | POA: Diagnosis not present

## 2017-04-22 DIAGNOSIS — K5090 Crohn's disease, unspecified, without complications: Secondary | ICD-10-CM | POA: Diagnosis not present

## 2017-04-22 DIAGNOSIS — T84032D Mechanical loosening of internal right knee prosthetic joint, subsequent encounter: Secondary | ICD-10-CM | POA: Diagnosis not present

## 2017-04-22 DIAGNOSIS — Z9181 History of falling: Secondary | ICD-10-CM | POA: Diagnosis not present

## 2017-04-22 DIAGNOSIS — I10 Essential (primary) hypertension: Secondary | ICD-10-CM | POA: Diagnosis not present

## 2017-04-22 DIAGNOSIS — M1991 Primary osteoarthritis, unspecified site: Secondary | ICD-10-CM | POA: Diagnosis not present

## 2017-04-22 DIAGNOSIS — S8991XD Unspecified injury of right lower leg, subsequent encounter: Secondary | ICD-10-CM | POA: Diagnosis not present

## 2017-04-22 DIAGNOSIS — Z87891 Personal history of nicotine dependence: Secondary | ICD-10-CM | POA: Diagnosis not present

## 2017-04-22 DIAGNOSIS — H409 Unspecified glaucoma: Secondary | ICD-10-CM | POA: Diagnosis not present

## 2017-04-23 DIAGNOSIS — S8991XD Unspecified injury of right lower leg, subsequent encounter: Secondary | ICD-10-CM | POA: Diagnosis not present

## 2017-04-23 DIAGNOSIS — Z9181 History of falling: Secondary | ICD-10-CM | POA: Diagnosis not present

## 2017-04-23 DIAGNOSIS — Z87891 Personal history of nicotine dependence: Secondary | ICD-10-CM | POA: Diagnosis not present

## 2017-04-23 DIAGNOSIS — I10 Essential (primary) hypertension: Secondary | ICD-10-CM | POA: Diagnosis not present

## 2017-04-23 DIAGNOSIS — T84032D Mechanical loosening of internal right knee prosthetic joint, subsequent encounter: Secondary | ICD-10-CM | POA: Diagnosis not present

## 2017-04-23 DIAGNOSIS — M1991 Primary osteoarthritis, unspecified site: Secondary | ICD-10-CM | POA: Diagnosis not present

## 2017-04-23 DIAGNOSIS — K5090 Crohn's disease, unspecified, without complications: Secondary | ICD-10-CM | POA: Diagnosis not present

## 2017-04-23 DIAGNOSIS — H409 Unspecified glaucoma: Secondary | ICD-10-CM | POA: Diagnosis not present

## 2017-04-27 DIAGNOSIS — Z96651 Presence of right artificial knee joint: Secondary | ICD-10-CM | POA: Diagnosis not present

## 2017-04-28 DIAGNOSIS — H409 Unspecified glaucoma: Secondary | ICD-10-CM | POA: Diagnosis not present

## 2017-04-28 DIAGNOSIS — T84032D Mechanical loosening of internal right knee prosthetic joint, subsequent encounter: Secondary | ICD-10-CM | POA: Diagnosis not present

## 2017-04-28 DIAGNOSIS — K5090 Crohn's disease, unspecified, without complications: Secondary | ICD-10-CM | POA: Diagnosis not present

## 2017-04-28 DIAGNOSIS — Z9181 History of falling: Secondary | ICD-10-CM | POA: Diagnosis not present

## 2017-04-28 DIAGNOSIS — I10 Essential (primary) hypertension: Secondary | ICD-10-CM | POA: Diagnosis not present

## 2017-04-28 DIAGNOSIS — S8991XD Unspecified injury of right lower leg, subsequent encounter: Secondary | ICD-10-CM | POA: Diagnosis not present

## 2017-04-28 DIAGNOSIS — M1991 Primary osteoarthritis, unspecified site: Secondary | ICD-10-CM | POA: Diagnosis not present

## 2017-04-28 DIAGNOSIS — Z87891 Personal history of nicotine dependence: Secondary | ICD-10-CM | POA: Diagnosis not present

## 2017-04-30 DIAGNOSIS — K5090 Crohn's disease, unspecified, without complications: Secondary | ICD-10-CM | POA: Diagnosis not present

## 2017-04-30 DIAGNOSIS — Z87891 Personal history of nicotine dependence: Secondary | ICD-10-CM | POA: Diagnosis not present

## 2017-04-30 DIAGNOSIS — S8991XD Unspecified injury of right lower leg, subsequent encounter: Secondary | ICD-10-CM | POA: Diagnosis not present

## 2017-04-30 DIAGNOSIS — H409 Unspecified glaucoma: Secondary | ICD-10-CM | POA: Diagnosis not present

## 2017-04-30 DIAGNOSIS — T84032D Mechanical loosening of internal right knee prosthetic joint, subsequent encounter: Secondary | ICD-10-CM | POA: Diagnosis not present

## 2017-04-30 DIAGNOSIS — Z9181 History of falling: Secondary | ICD-10-CM | POA: Diagnosis not present

## 2017-04-30 DIAGNOSIS — I10 Essential (primary) hypertension: Secondary | ICD-10-CM | POA: Diagnosis not present

## 2017-04-30 DIAGNOSIS — M1991 Primary osteoarthritis, unspecified site: Secondary | ICD-10-CM | POA: Diagnosis not present

## 2017-05-01 DIAGNOSIS — K5090 Crohn's disease, unspecified, without complications: Secondary | ICD-10-CM | POA: Diagnosis not present

## 2017-05-01 DIAGNOSIS — I10 Essential (primary) hypertension: Secondary | ICD-10-CM | POA: Diagnosis not present

## 2017-05-01 DIAGNOSIS — S8991XD Unspecified injury of right lower leg, subsequent encounter: Secondary | ICD-10-CM | POA: Diagnosis not present

## 2017-05-01 DIAGNOSIS — Z87891 Personal history of nicotine dependence: Secondary | ICD-10-CM | POA: Diagnosis not present

## 2017-05-01 DIAGNOSIS — Z9181 History of falling: Secondary | ICD-10-CM | POA: Diagnosis not present

## 2017-05-01 DIAGNOSIS — T84032D Mechanical loosening of internal right knee prosthetic joint, subsequent encounter: Secondary | ICD-10-CM | POA: Diagnosis not present

## 2017-05-01 DIAGNOSIS — H409 Unspecified glaucoma: Secondary | ICD-10-CM | POA: Diagnosis not present

## 2017-05-01 DIAGNOSIS — M1991 Primary osteoarthritis, unspecified site: Secondary | ICD-10-CM | POA: Diagnosis not present

## 2017-05-05 DIAGNOSIS — M1991 Primary osteoarthritis, unspecified site: Secondary | ICD-10-CM | POA: Diagnosis not present

## 2017-05-05 DIAGNOSIS — I10 Essential (primary) hypertension: Secondary | ICD-10-CM | POA: Diagnosis not present

## 2017-05-05 DIAGNOSIS — S8991XD Unspecified injury of right lower leg, subsequent encounter: Secondary | ICD-10-CM | POA: Diagnosis not present

## 2017-05-05 DIAGNOSIS — Z87891 Personal history of nicotine dependence: Secondary | ICD-10-CM | POA: Diagnosis not present

## 2017-05-05 DIAGNOSIS — K5090 Crohn's disease, unspecified, without complications: Secondary | ICD-10-CM | POA: Diagnosis not present

## 2017-05-05 DIAGNOSIS — Z9181 History of falling: Secondary | ICD-10-CM | POA: Diagnosis not present

## 2017-05-05 DIAGNOSIS — T84032D Mechanical loosening of internal right knee prosthetic joint, subsequent encounter: Secondary | ICD-10-CM | POA: Diagnosis not present

## 2017-05-05 DIAGNOSIS — H409 Unspecified glaucoma: Secondary | ICD-10-CM | POA: Diagnosis not present

## 2017-05-08 DIAGNOSIS — Z87891 Personal history of nicotine dependence: Secondary | ICD-10-CM | POA: Diagnosis not present

## 2017-05-08 DIAGNOSIS — K5090 Crohn's disease, unspecified, without complications: Secondary | ICD-10-CM | POA: Diagnosis not present

## 2017-05-08 DIAGNOSIS — Z9181 History of falling: Secondary | ICD-10-CM | POA: Diagnosis not present

## 2017-05-08 DIAGNOSIS — T84032D Mechanical loosening of internal right knee prosthetic joint, subsequent encounter: Secondary | ICD-10-CM | POA: Diagnosis not present

## 2017-05-08 DIAGNOSIS — S8991XD Unspecified injury of right lower leg, subsequent encounter: Secondary | ICD-10-CM | POA: Diagnosis not present

## 2017-05-08 DIAGNOSIS — I10 Essential (primary) hypertension: Secondary | ICD-10-CM | POA: Diagnosis not present

## 2017-05-08 DIAGNOSIS — H409 Unspecified glaucoma: Secondary | ICD-10-CM | POA: Diagnosis not present

## 2017-05-08 DIAGNOSIS — M1991 Primary osteoarthritis, unspecified site: Secondary | ICD-10-CM | POA: Diagnosis not present

## 2017-05-10 DIAGNOSIS — Z9181 History of falling: Secondary | ICD-10-CM | POA: Diagnosis not present

## 2017-05-10 DIAGNOSIS — H409 Unspecified glaucoma: Secondary | ICD-10-CM | POA: Diagnosis not present

## 2017-05-10 DIAGNOSIS — I10 Essential (primary) hypertension: Secondary | ICD-10-CM | POA: Diagnosis not present

## 2017-05-10 DIAGNOSIS — Z87891 Personal history of nicotine dependence: Secondary | ICD-10-CM | POA: Diagnosis not present

## 2017-05-10 DIAGNOSIS — S8991XD Unspecified injury of right lower leg, subsequent encounter: Secondary | ICD-10-CM | POA: Diagnosis not present

## 2017-05-10 DIAGNOSIS — K5090 Crohn's disease, unspecified, without complications: Secondary | ICD-10-CM | POA: Diagnosis not present

## 2017-05-10 DIAGNOSIS — M1991 Primary osteoarthritis, unspecified site: Secondary | ICD-10-CM | POA: Diagnosis not present

## 2017-05-10 DIAGNOSIS — T84032D Mechanical loosening of internal right knee prosthetic joint, subsequent encounter: Secondary | ICD-10-CM | POA: Diagnosis not present

## 2017-05-11 ENCOUNTER — Encounter: Payer: Medicare Other | Admitting: Internal Medicine

## 2017-05-11 DIAGNOSIS — Z96651 Presence of right artificial knee joint: Secondary | ICD-10-CM | POA: Diagnosis not present

## 2017-05-11 DIAGNOSIS — M25561 Pain in right knee: Secondary | ICD-10-CM | POA: Diagnosis not present

## 2017-05-11 DIAGNOSIS — M6281 Muscle weakness (generalized): Secondary | ICD-10-CM | POA: Diagnosis not present

## 2017-05-11 DIAGNOSIS — M25661 Stiffness of right knee, not elsewhere classified: Secondary | ICD-10-CM | POA: Diagnosis not present

## 2017-05-15 DIAGNOSIS — M25661 Stiffness of right knee, not elsewhere classified: Secondary | ICD-10-CM | POA: Diagnosis not present

## 2017-05-15 DIAGNOSIS — Z96651 Presence of right artificial knee joint: Secondary | ICD-10-CM | POA: Diagnosis not present

## 2017-05-15 DIAGNOSIS — M25561 Pain in right knee: Secondary | ICD-10-CM | POA: Diagnosis not present

## 2017-05-15 DIAGNOSIS — M6281 Muscle weakness (generalized): Secondary | ICD-10-CM | POA: Diagnosis not present

## 2017-05-18 DIAGNOSIS — M6281 Muscle weakness (generalized): Secondary | ICD-10-CM | POA: Diagnosis not present

## 2017-05-18 DIAGNOSIS — Z96651 Presence of right artificial knee joint: Secondary | ICD-10-CM | POA: Diagnosis not present

## 2017-05-18 DIAGNOSIS — M25661 Stiffness of right knee, not elsewhere classified: Secondary | ICD-10-CM | POA: Diagnosis not present

## 2017-05-18 DIAGNOSIS — M25561 Pain in right knee: Secondary | ICD-10-CM | POA: Diagnosis not present

## 2017-05-19 DIAGNOSIS — M25561 Pain in right knee: Secondary | ICD-10-CM | POA: Diagnosis not present

## 2017-05-19 DIAGNOSIS — Z96651 Presence of right artificial knee joint: Secondary | ICD-10-CM | POA: Diagnosis not present

## 2017-05-19 DIAGNOSIS — M25661 Stiffness of right knee, not elsewhere classified: Secondary | ICD-10-CM | POA: Diagnosis not present

## 2017-05-19 DIAGNOSIS — M6281 Muscle weakness (generalized): Secondary | ICD-10-CM | POA: Diagnosis not present

## 2017-05-25 DIAGNOSIS — H401132 Primary open-angle glaucoma, bilateral, moderate stage: Secondary | ICD-10-CM | POA: Diagnosis not present

## 2017-05-26 DIAGNOSIS — M6281 Muscle weakness (generalized): Secondary | ICD-10-CM | POA: Diagnosis not present

## 2017-05-26 DIAGNOSIS — M25661 Stiffness of right knee, not elsewhere classified: Secondary | ICD-10-CM | POA: Diagnosis not present

## 2017-05-26 DIAGNOSIS — Z96651 Presence of right artificial knee joint: Secondary | ICD-10-CM | POA: Diagnosis not present

## 2017-05-26 DIAGNOSIS — M25561 Pain in right knee: Secondary | ICD-10-CM | POA: Diagnosis not present

## 2017-05-28 DIAGNOSIS — Z96651 Presence of right artificial knee joint: Secondary | ICD-10-CM | POA: Diagnosis not present

## 2017-05-28 DIAGNOSIS — M6281 Muscle weakness (generalized): Secondary | ICD-10-CM | POA: Diagnosis not present

## 2017-05-28 DIAGNOSIS — M25561 Pain in right knee: Secondary | ICD-10-CM | POA: Diagnosis not present

## 2017-05-28 DIAGNOSIS — M25661 Stiffness of right knee, not elsewhere classified: Secondary | ICD-10-CM | POA: Diagnosis not present

## 2017-06-02 DIAGNOSIS — M6281 Muscle weakness (generalized): Secondary | ICD-10-CM | POA: Diagnosis not present

## 2017-06-02 DIAGNOSIS — M25661 Stiffness of right knee, not elsewhere classified: Secondary | ICD-10-CM | POA: Diagnosis not present

## 2017-06-02 DIAGNOSIS — M25561 Pain in right knee: Secondary | ICD-10-CM | POA: Diagnosis not present

## 2017-06-02 DIAGNOSIS — Z96651 Presence of right artificial knee joint: Secondary | ICD-10-CM | POA: Diagnosis not present

## 2017-06-04 DIAGNOSIS — M25661 Stiffness of right knee, not elsewhere classified: Secondary | ICD-10-CM | POA: Diagnosis not present

## 2017-06-04 DIAGNOSIS — M6281 Muscle weakness (generalized): Secondary | ICD-10-CM | POA: Diagnosis not present

## 2017-06-04 DIAGNOSIS — Z96651 Presence of right artificial knee joint: Secondary | ICD-10-CM | POA: Diagnosis not present

## 2017-06-04 DIAGNOSIS — M25561 Pain in right knee: Secondary | ICD-10-CM | POA: Diagnosis not present

## 2017-06-12 DIAGNOSIS — M25561 Pain in right knee: Secondary | ICD-10-CM | POA: Diagnosis not present

## 2017-06-12 DIAGNOSIS — Z96651 Presence of right artificial knee joint: Secondary | ICD-10-CM | POA: Diagnosis not present

## 2017-06-18 ENCOUNTER — Ambulatory Visit: Payer: Medicare Other | Admitting: Internal Medicine

## 2017-06-18 DIAGNOSIS — Z96651 Presence of right artificial knee joint: Secondary | ICD-10-CM | POA: Diagnosis not present

## 2017-06-18 DIAGNOSIS — M25561 Pain in right knee: Secondary | ICD-10-CM | POA: Diagnosis not present

## 2017-07-15 ENCOUNTER — Encounter: Payer: Self-pay | Admitting: Internal Medicine

## 2017-07-15 ENCOUNTER — Ambulatory Visit (INDEPENDENT_AMBULATORY_CARE_PROVIDER_SITE_OTHER): Payer: Medicare Other | Admitting: Internal Medicine

## 2017-07-15 VITALS — BP 126/82 | HR 59 | Temp 98.4°F | Resp 16 | Ht 65.0 in | Wt 155.0 lb

## 2017-07-15 DIAGNOSIS — Z1231 Encounter for screening mammogram for malignant neoplasm of breast: Secondary | ICD-10-CM

## 2017-07-15 DIAGNOSIS — Z23 Encounter for immunization: Secondary | ICD-10-CM

## 2017-07-15 DIAGNOSIS — Z0000 Encounter for general adult medical examination without abnormal findings: Secondary | ICD-10-CM | POA: Diagnosis not present

## 2017-07-15 DIAGNOSIS — I10 Essential (primary) hypertension: Secondary | ICD-10-CM

## 2017-07-15 DIAGNOSIS — M25561 Pain in right knee: Secondary | ICD-10-CM

## 2017-07-15 DIAGNOSIS — K5010 Crohn's disease of large intestine without complications: Secondary | ICD-10-CM | POA: Diagnosis not present

## 2017-07-15 DIAGNOSIS — Z1239 Encounter for other screening for malignant neoplasm of breast: Secondary | ICD-10-CM

## 2017-07-15 DIAGNOSIS — E7800 Pure hypercholesterolemia, unspecified: Secondary | ICD-10-CM

## 2017-07-15 MED ORDER — AMLODIPINE BESYLATE 5 MG PO TABS: 5.0000 mg | ORAL_TABLET | Freq: Every day | ORAL | 1 refills | Status: DC

## 2017-07-15 NOTE — Assessment & Plan Note (Addendum)
Physical today 07/15/17.  Colonoscopy 04/15/16.  PAP 08/22/12.  Schedule mammogram.

## 2017-07-15 NOTE — Progress Notes (Signed)
Patient ID: Ellen Drake, female   DOB: [birth year], 71 y.o.   MRN: [id number]   Subjective:    Patient ID: Ellen Drake, female    DOB: 08/16/1945, 71 y.o.   MRN: [id number]  HPI  Patient here for her physical exam.  She is still having problems with her knee.  S/p four surgeries now.  Still with increased pain and swelling.  Planning to see neurology soon.  Has been followed by ortho.  Limits her activity.  States other than her knee, she is doing well.  No chest pain.  No sob.  No acid reflux.  No abdominal pain.  Bowels moving.     Past Medical History:  Diagnosis Date  . Anemia    reslved  . Colon polyp 2009  . Crohn's disease ([place])   . Diverticulosis   . Fibrocystic breast disease   . Herniated nucleus pulposus of lumbosacral region   . History of chicken pox   . Hypercholesterolemia   . Hyperlipidemia   . Hypertension   . Osteoarthritis   . Pulmonary nodule seen on imaging study 2004   resolved 2007   Past Surgical History:  Procedure Laterality Date  . anterior cervical discectomy and fusion  1998   Dr Rolin Barry  . BACK SURGERY  1992   ruptured disc (Dr Mauri Pole)  . BREAST BIOPSY Left    neg-no scar seen  . COLECTOMY  2003  . COLON SURGERY  1998  . COLONOSCOPY  2009  . COLONOSCOPY W/ BIOPSIES  11/30/2012   Procedure: COLONOSCOPY W/BIOPSY; Surgeon: Colvin Caroli, MD; Location: [place]; Service: Gastroenterology;;  . COLONOSCOPY W/ BIOPSIES  04/15/2016   Procedure: Colonoscopy ; Surgeon: Colvin Caroli, MD; Location: [place]; Service: Gastroenterology; Laterality: N/A;  . KNEE ARTHROSCOPY W/ SYNOVECTOMY Right 03/10/2014   limited  . KNEE SURGERY Right 1/16   [facility] (Dr. Sherlynn Carbon)  . POSTERIOR LAMINECTOMY / DECOMPRESSION LUMBAR SPINE  1999  . REPLACEMENT TOTAL KNEE    . ROTATOR CUFF REPAIR Left 2007   acute open; accident   Family History  Problem Relation Age of Onset  . CVA Mother   . Hypertension Mother   .  Hypertension Sister        brain tumor  . Breast cancer Neg Hx   . Colon cancer Neg Hx    Social History   Socioeconomic History  . Marital status: Married    Spouse name: None  . Number of children: 0  . Years of education: 45  . Highest education level: None  Social Needs  . Financial resource strain: None  . Food insecurity - worry: None  . Food insecurity - inability: None  . Transportation needs - medical: None  . Transportation needs - non-medical: None  Occupational History  . None  Tobacco Use  . Smoking status: Former Smoker    Last attempt to quit: 09/10/2009    Years since quitting: 7.8  . Smokeless tobacco: Never Used  Substance and Sexual Activity  . Alcohol use: Yes    Alcohol/week: 0.6 oz    Types: 1 Glasses of wine per week    Comment: wine occasional; socially  . Drug use: No  . Sexual activity: Yes  Other Topics Concern  . None  Social History Narrative  . None    Outpatient Encounter Medications as of 07/15/2017  Medication Sig  . amLODipine (NORVASC) 5 MG tablet Take 1 tablet (5 mg total) by  mouth daily.  Marland Kitchen atenolol (TENORMIN) 50 MG tablet TAKE 1 TABLET (50 MG TOTAL) BY MOUTH DAILY.  . brimonidine (ALPHAGAN) 0.2 % ophthalmic solution 1 drop 2 (two) times daily.   Marland Kitchen HYDROmorphone (DILAUDID) 2 MG tablet Take 1-2 tablets (2-4 mg total) by mouth every 3 (three) hours as needed. 1-2 tablets (28m - 4 mg)  . latanoprost (XALATAN) 0.005 % ophthalmic solution Place 1 drop into both eyes at bedtime.   .Marland Kitchenlisinopril (PRINIVIL,ZESTRIL) 40 MG tablet TAKE 1 TABLET (40 MG TOTAL) BY MOUTH DAILY.  .Marland KitchensulfaSALAzine (AZULFIDINE) 500 MG tablet Take 1,500 mg by mouth 2 (two) times daily. 3 tabs  . timolol (BETIMOL) 0.5 % ophthalmic solution 1 drop 2 (two) times daily.  . [DISCONTINUED] amLODipine (NORVASC) 5 MG tablet Take 1 tablet (5 mg total) by mouth daily.  . [DISCONTINUED] acetaminophen (TYLENOL) 325 MG tablet Take 650 mg by mouth 4 (four) times daily.   .  [DISCONTINUED] celecoxib (CELEBREX) 200 MG capsule Take 200 mg by mouth daily.  . [DISCONTINUED] docusate sodium (COLACE) 100 MG capsule Take 200 mg by mouth daily.  . [DISCONTINUED] methocarbamol (ROBAXIN) 500 MG tablet Take 500 mg by mouth 3 (three) times daily.  . [DISCONTINUED] promethazine (PHENERGAN) 12.5 MG tablet Take 12.5 mg by mouth every 6 (six) hours as needed for nausea or vomiting.   No facility-administered encounter medications on file as of 07/15/2017.     Review of Systems  Constitutional: Negative for appetite change and unexpected weight change.  HENT: Negative for congestion and sinus pressure.   Eyes: Negative for pain and visual disturbance.  Respiratory: Negative for cough, chest tightness and shortness of breath.   Cardiovascular: Negative for chest pain, palpitations and leg swelling.  Gastrointestinal: Negative for abdominal pain, diarrhea, nausea and vomiting.  Genitourinary: Negative for difficulty urinating and dysuria.  Musculoskeletal: Negative for back pain.       Knee pain and joint swelling as outlined.    Skin: Negative for color change and rash.  Neurological: Negative for dizziness, light-headedness and headaches.  Hematological: Negative for adenopathy. Does not bruise/bleed easily.  Psychiatric/Behavioral: Negative for agitation and dysphoric mood.       Objective:    Physical Exam  Constitutional: She is oriented to person, place, and time. She appears well-developed and well-nourished. No distress.  HENT:  Nose: Nose normal.  Mouth/Throat: Oropharynx is clear and moist.  Eyes: Right eye exhibits no discharge. Left eye exhibits no discharge. No scleral icterus.  Neck: Neck supple. No thyromegaly present.  Cardiovascular: Normal rate and regular rhythm.  Pulmonary/Chest: Breath sounds normal. No accessory muscle usage. No tachypnea. No respiratory distress. She has no decreased breath sounds. She has no wheezes. She has no rhonchi. Right  breast exhibits no inverted nipple, no mass, no nipple discharge and no tenderness (no axillary adenopathy). Left breast exhibits no inverted nipple, no mass, no nipple discharge and no tenderness (no axilarry adenopathy).  Abdominal: Soft. Bowel sounds are normal. There is no tenderness.  Musculoskeletal: She exhibits no edema or tenderness.  Right knee swelling.  No increased erythema.   Lymphadenopathy:    She has no cervical adenopathy.  Neurological: She is alert and oriented to person, place, and time.  Skin: Skin is warm. No rash noted. No erythema.  Psychiatric: She has a normal mood and affect. Her behavior is normal.    BP 126/82   Pulse (!) 59   Temp 98.4 F (36.9 C)   Resp 16   Ht  5' 5"  (1.651 m)   Wt 155 lb (70.3 kg)   LMP 08/13/1995   SpO2 95%   BMI 25.79 kg/m  Wt Readings from Last 3 Encounters:  07/15/17 155 lb (70.3 kg)  03/27/17 159 lb 8 oz (72.3 kg)  03/20/17 159 lb 8 oz (72.3 kg)     Lab Results  Component Value Date   WBC 10.4 01/06/2017   HGB 12.6 01/06/2017   HCT 37.9 01/06/2017   PLT 201.0 01/06/2017   GLUCOSE 88 03/10/2017   CHOL 207 (H) 12/30/2016   TRIG 149.0 12/30/2016   HDL 62.60 12/30/2016   LDLDIRECT 132.5 05/06/2013   LDLCALC 115 (H) 12/30/2016   ALT 12 12/30/2016   AST 18 12/30/2016   NA 144 03/10/2017   K 3.8 03/10/2017   CL 108 03/10/2017   CREATININE 0.83 03/10/2017   BUN 17 03/10/2017   CO2 29 03/10/2017   TSH 2.05 12/30/2016   HGBA1C 5.7 10/19/2012       Assessment & Plan:   Problem List Items Addressed This Visit    Crohn's disease ([place])    Currently doing well on current regimen.  Followed by GI - Dr Redmond Pulling.  Stable.       Health care maintenance    Physical today 07/15/17.  Colonoscopy 04/15/16.  PAP 08/22/12.  Schedule mammogram.        Hypercholesterolemia    Low cholesterol diet and exercise.  Follow lipid panel and liver function tests.       Relevant Medications   amLODipine (NORVASC) 5 MG tablet    Hypertension    Blood pressure elevated on recheck.  States has been under good control.  Continue same medication regimen.  Follow pressures.  Follow metabolic panel.  Hold on making changes.        Relevant Medications   amLODipine (NORVASC) 5 MG tablet   Knee pain    Persistent pain and swelling.  Has been seeing ortho.  Scheduled to see neurology.         Other Visit Diagnoses    Routine general medical examination at a health care facility    -  Primary   Breast cancer screening       Relevant Orders   MM DIGITAL SCREENING BILATERAL   Need for influenza vaccination       Relevant Orders   Flu vaccine HIGH DOSE PF (Completed)       Einar Pheasant, MD

## 2017-07-16 ENCOUNTER — Other Ambulatory Visit: Payer: Self-pay | Admitting: Internal Medicine

## 2017-07-16 ENCOUNTER — Telehealth: Payer: Self-pay | Admitting: Radiology

## 2017-07-16 DIAGNOSIS — E7800 Pure hypercholesterolemia, unspecified: Secondary | ICD-10-CM

## 2017-07-16 DIAGNOSIS — E538 Deficiency of other specified B group vitamins: Secondary | ICD-10-CM

## 2017-07-16 DIAGNOSIS — I10 Essential (primary) hypertension: Secondary | ICD-10-CM

## 2017-07-16 NOTE — Progress Notes (Signed)
Orders placed for labs

## 2017-07-16 NOTE — Telephone Encounter (Signed)
Orders placed for labs

## 2017-07-16 NOTE — Telephone Encounter (Signed)
Pt coming in for labs next Tuesday, please place future orders. Thank you.  

## 2017-07-18 ENCOUNTER — Encounter: Payer: Self-pay | Admitting: Internal Medicine

## 2017-07-18 NOTE — Assessment & Plan Note (Signed)
Low cholesterol diet and exercise.  Follow lipid panel and liver function tests.

## 2017-07-18 NOTE — Assessment & Plan Note (Signed)
Persistent pain and swelling.  Has been seeing ortho.  Scheduled to see neurology.

## 2017-07-18 NOTE — Assessment & Plan Note (Signed)
Currently doing well on current regimen.  Followed by GI - Dr Redmond Pulling.  Stable.

## 2017-07-18 NOTE — Assessment & Plan Note (Signed)
Blood pressure elevated on recheck.  States has been under good control.  Continue same medication regimen.  Follow pressures.  Follow metabolic panel.  Hold on making changes.

## 2017-07-21 ENCOUNTER — Other Ambulatory Visit: Payer: Medicare Other

## 2017-07-21 ENCOUNTER — Ambulatory Visit: Payer: Medicare Other

## 2017-07-23 ENCOUNTER — Encounter: Payer: Self-pay | Admitting: Internal Medicine

## 2017-07-23 ENCOUNTER — Telehealth: Payer: Self-pay | Admitting: Internal Medicine

## 2017-07-29 DIAGNOSIS — M25569 Pain in unspecified knee: Secondary | ICD-10-CM | POA: Diagnosis not present

## 2017-07-29 DIAGNOSIS — Z79891 Long term (current) use of opiate analgesic: Secondary | ICD-10-CM | POA: Diagnosis not present

## 2017-07-31 ENCOUNTER — Other Ambulatory Visit (INDEPENDENT_AMBULATORY_CARE_PROVIDER_SITE_OTHER): Payer: Medicare Other

## 2017-07-31 DIAGNOSIS — E538 Deficiency of other specified B group vitamins: Secondary | ICD-10-CM | POA: Diagnosis not present

## 2017-07-31 DIAGNOSIS — I10 Essential (primary) hypertension: Secondary | ICD-10-CM

## 2017-07-31 DIAGNOSIS — E7800 Pure hypercholesterolemia, unspecified: Secondary | ICD-10-CM

## 2017-07-31 LAB — LIPID PANEL
Cholesterol: 176 mg/dL (ref 0–200)
HDL: 47.8 mg/dL (ref >39.00)
LDL Cholesterol: 105 mg/dL — ABNORMAL HIGH (ref 0–99)
NonHDL: 127.93
Total CHOL/HDL Ratio: 4
Triglycerides: 114 mg/dL (ref 0.0–149.0)
VLDL: 22.8 mg/dL (ref 0.0–40.0)

## 2017-07-31 LAB — HEPATIC FUNCTION PANEL
ALT: 7 U/L (ref 0–35)
AST: 15 U/L (ref 0–37)
Albumin: 3.9 g/dL (ref 3.5–5.2)
Alkaline Phosphatase: 81 U/L (ref 39–117)
Bilirubin, Direct: 0 mg/dL (ref 0.0–0.3)
Total Bilirubin: 0.4 mg/dL (ref 0.2–1.2)
Total Protein: 7 g/dL (ref 6.0–8.3)

## 2017-07-31 LAB — BASIC METABOLIC PANEL
BUN: 14 mg/dL (ref 6–23)
CO2: 29 mEq/L (ref 19–32)
Calcium: 9.1 mg/dL (ref 8.4–10.5)
Chloride: 108 mEq/L (ref 96–112)
Creatinine, Ser: 0.74 mg/dL (ref 0.40–1.20)
GFR: 99.3 mL/min (ref >60.00)
Glucose, Bld: 96 mg/dL (ref 70–99)
Potassium: 3.9 mEq/L (ref 3.5–5.1)
Sodium: 141 mEq/L (ref 135–145)

## 2017-07-31 LAB — VITAMIN B12: Vitamin B-12: 152 pg/mL — ABNORMAL LOW (ref 211–911)

## 2017-08-13 ENCOUNTER — Ambulatory Visit (INDEPENDENT_AMBULATORY_CARE_PROVIDER_SITE_OTHER): Payer: Medicare HMO | Admitting: *Deleted

## 2017-08-13 DIAGNOSIS — E538 Deficiency of other specified B group vitamins: Secondary | ICD-10-CM

## 2017-08-13 MED ORDER — CYANOCOBALAMIN 1000 MCG/ML IJ SOLN: 1000.0000 ug | Freq: Once | INTRAMUSCULAR | Status: DC

## 2017-08-13 MED ORDER — CYANOCOBALAMIN 1000 MCG/ML IJ SOLN
1000.0000 ug | Freq: Once | INTRAMUSCULAR | Status: AC
  Administered 2017-08-13: 1000 ug via INTRAMUSCULAR

## 2017-08-13 NOTE — Progress Notes (Addendum)
Patient presented for B 12 injection to left deltoid, patient voiced no concerns nor showed any signs of distress during injection.  Reviewed.  Dr Scott 

## 2017-08-19 DIAGNOSIS — M25561 Pain in right knee: Secondary | ICD-10-CM | POA: Diagnosis not present

## 2017-08-19 DIAGNOSIS — M25562 Pain in left knee: Secondary | ICD-10-CM | POA: Diagnosis not present

## 2017-08-20 ENCOUNTER — Ambulatory Visit (INDEPENDENT_AMBULATORY_CARE_PROVIDER_SITE_OTHER): Payer: Medicare HMO

## 2017-08-20 DIAGNOSIS — E538 Deficiency of other specified B group vitamins: Secondary | ICD-10-CM | POA: Diagnosis not present

## 2017-08-20 DIAGNOSIS — M25561 Pain in right knee: Secondary | ICD-10-CM | POA: Diagnosis not present

## 2017-08-20 MED ORDER — CYANOCOBALAMIN 1000 MCG/ML IJ SOLN
1000.0000 ug | Freq: Once | INTRAMUSCULAR | Status: AC
  Administered 2017-08-20: 1000 ug via INTRAMUSCULAR

## 2017-08-20 NOTE — Progress Notes (Addendum)
Patient presented for B 12 injection to left deltoid, patient voiced no concerns nor showed any signs of distress during injection.  Reviewed.  Dr Scott 

## 2017-08-27 ENCOUNTER — Ambulatory Visit
Admission: RE | Admit: 2017-08-27 | Discharge: 2017-08-27 | Disposition: A | Discharge status: Home / Self Care | Payer: Medicare HMO | Source: Ambulatory Visit | Attending: Internal Medicine | Admitting: Internal Medicine

## 2017-08-27 ENCOUNTER — Ambulatory Visit (INDEPENDENT_AMBULATORY_CARE_PROVIDER_SITE_OTHER): Payer: Medicare HMO | Admitting: *Deleted

## 2017-08-27 ENCOUNTER — Other Ambulatory Visit: Payer: Self-pay | Admitting: Internal Medicine

## 2017-08-27 DIAGNOSIS — Z1231 Encounter for screening mammogram for malignant neoplasm of breast: Secondary | ICD-10-CM | POA: Insufficient documentation

## 2017-08-27 DIAGNOSIS — Z1239 Encounter for other screening for malignant neoplasm of breast: Secondary | ICD-10-CM

## 2017-08-27 DIAGNOSIS — E538 Deficiency of other specified B group vitamins: Secondary | ICD-10-CM

## 2017-08-27 MED ORDER — CYANOCOBALAMIN 1000 MCG/ML IJ SOLN
1000.0000 ug | Freq: Once | INTRAMUSCULAR | Status: AC
  Administered 2017-08-27: 1000 ug via INTRAMUSCULAR

## 2017-08-27 NOTE — Progress Notes (Addendum)
Patient presented for B 12 injection to right deltoid, patient voiced no concerns nor showed any signs of distress during injection.  Reviewed.   Dr Nicki Reaper

## 2017-09-03 ENCOUNTER — Ambulatory Visit (INDEPENDENT_AMBULATORY_CARE_PROVIDER_SITE_OTHER): Payer: Medicare HMO

## 2017-09-03 DIAGNOSIS — E538 Deficiency of other specified B group vitamins: Secondary | ICD-10-CM | POA: Diagnosis not present

## 2017-09-03 MED ORDER — CYANOCOBALAMIN 1000 MCG/ML IJ SOLN
1000.0000 ug | Freq: Once | INTRAMUSCULAR | Status: AC
  Administered 2017-09-03: 1000 ug via INTRAMUSCULAR

## 2017-09-03 NOTE — Progress Notes (Addendum)
Patient presented for B 12 injection to right deltoid, patient voiced no concerns nor showed any signs of distress during injection.  Reviewed.  Dr Nicki Reaper

## 2017-09-09 DIAGNOSIS — M25561 Pain in right knee: Secondary | ICD-10-CM | POA: Diagnosis not present

## 2017-09-09 DIAGNOSIS — M545 Low back pain: Secondary | ICD-10-CM | POA: Diagnosis not present

## 2017-09-15 ENCOUNTER — Encounter: Payer: Self-pay | Admitting: Internal Medicine

## 2017-09-15 ENCOUNTER — Ambulatory Visit (INDEPENDENT_AMBULATORY_CARE_PROVIDER_SITE_OTHER): Payer: Medicare HMO | Admitting: Internal Medicine

## 2017-09-15 VITALS — BP 136/82 | HR 64 | Temp 98.3°F | Resp 18 | Wt 151.2 lb

## 2017-09-15 DIAGNOSIS — E7800 Pure hypercholesterolemia, unspecified: Secondary | ICD-10-CM | POA: Diagnosis not present

## 2017-09-15 DIAGNOSIS — M25561 Pain in right knee: Secondary | ICD-10-CM | POA: Diagnosis not present

## 2017-09-15 DIAGNOSIS — E538 Deficiency of other specified B group vitamins: Secondary | ICD-10-CM

## 2017-09-15 DIAGNOSIS — I10 Essential (primary) hypertension: Secondary | ICD-10-CM | POA: Diagnosis not present

## 2017-09-15 DIAGNOSIS — K5010 Crohn's disease of large intestine without complications: Secondary | ICD-10-CM

## 2017-09-15 DIAGNOSIS — Z7185 Encounter for immunization safety counseling: Secondary | ICD-10-CM

## 2017-09-15 DIAGNOSIS — Z7189 Other specified counseling: Secondary | ICD-10-CM

## 2017-09-15 NOTE — Progress Notes (Signed)
Patient ID: Isabelly Day Choi, female   DOB: 09/09/1945, 72 y.o.   MRN: [id number]   Subjective:    Patient ID: Arnoldo Hooker Day Bolda, female    DOB: 03-22-1946, 72 y.o.   MRN: [id number]  HPI  Patient here for a scheduled follow up.  Still having persistent knee pain and swelling.  Has been evaluated by multiple physicians.  See previous notes.  Planning to f/u with [facility] for nerve block.  States other than her knee, she feels she is doing well.  No chest pain.  No sob.  No increased cough or congestion.  No abdominal pain.  No acid reflux.  Bowels doing well. No recent flares.  Has f/u with Dr Redmond Pulling 01/2018.  States blood pressures averaging 132-134/70s.  Weight is down.  No vomiting.  Eating.     Past Medical History:  Diagnosis Date  . Anemia    reslved  . Colon polyp 2009  . Crohn's disease ([place])   . Diverticulosis   . Fibrocystic breast disease   . Herniated nucleus pulposus of lumbosacral region   . History of chicken pox   . Hypercholesterolemia   . Hyperlipidemia   . Hypertension   . Osteoarthritis   . Pulmonary nodule seen on imaging study 2004   resolved 2007   Past Surgical History:  Procedure Laterality Date  . anterior cervical discectomy and fusion  1998   Dr Rolin Barry  . BACK SURGERY  1992   ruptured disc (Dr Mauri Pole)  . BREAST BIOPSY Left    neg-no scar seen  . COLECTOMY  2003  . COLON SURGERY  1998  . COLONOSCOPY  2009  . COLONOSCOPY W/ BIOPSIES  11/30/2012   Procedure: COLONOSCOPY W/BIOPSY; Surgeon: Colvin Caroli, MD; Location: [place]; Service: Gastroenterology;;  . COLONOSCOPY W/ BIOPSIES  04/15/2016   Procedure: Colonoscopy ; Surgeon: Colvin Caroli, MD; Location: [place]; Service: Gastroenterology; Laterality: N/A;  . KNEE ARTHROSCOPY W/ SYNOVECTOMY Right 03/10/2014   limited  . KNEE SURGERY Right 1/16   [facility] (Dr. Sherlynn Carbon)  . POSTERIOR LAMINECTOMY / DECOMPRESSION LUMBAR SPINE  1999  . REPLACEMENT TOTAL KNEE      . ROTATOR CUFF REPAIR Left 2007   acute open; accident   Family History  Problem Relation Age of Onset  . CVA Mother   . Hypertension Mother   . Hypertension Sister        brain tumor  . Breast cancer Neg Hx   . Colon cancer Neg Hx    Social History   Socioeconomic History  . Marital status: Married    Spouse name: None  . Number of children: 0  . Years of education: 63  . Highest education level: None  Social Needs  . Financial resource strain: None  . Food insecurity - worry: None  . Food insecurity - inability: None  . Transportation needs - medical: None  . Transportation needs - non-medical: None  Occupational History  . None  Tobacco Use  . Smoking status: Former Smoker    Last attempt to quit: 09/10/2009    Years since quitting: 8.0  . Smokeless tobacco: Never Used  Substance and Sexual Activity  . Alcohol use: Yes    Alcohol/week: 0.6 oz    Types: 1 Glasses of wine per week    Comment: wine occasional; socially  . Drug use: No  . Sexual activity: Yes  Other Topics Concern  . None  Social History Narrative  .  None    Outpatient Encounter Medications as of 09/15/2017  Medication Sig  . amLODipine (NORVASC) 5 MG tablet Take 1 tablet (5 mg total) by mouth daily.  Marland Kitchen atenolol (TENORMIN) 50 MG tablet TAKE 1 TABLET (50 MG TOTAL) BY MOUTH DAILY.  . brimonidine (ALPHAGAN) 0.2 % ophthalmic solution 1 drop 2 (two) times daily.   Marland Kitchen HYDROmorphone (DILAUDID) 2 MG tablet Take 1-2 tablets (2-4 mg total) by mouth every 3 (three) hours as needed. 1-2 tablets (41m - 4 mg)  . latanoprost (XALATAN) 0.005 % ophthalmic solution Place 1 drop into both eyes at bedtime.   .Marland Kitchenlisinopril (PRINIVIL,ZESTRIL) 40 MG tablet TAKE 1 TABLET (40 MG TOTAL) BY MOUTH DAILY.  .Marland KitchensulfaSALAzine (AZULFIDINE) 500 MG tablet Take 1,500 mg by mouth 2 (two) times daily. 3 tabs  . timolol (BETIMOL) 0.5 % ophthalmic solution 1 drop 2 (two) times daily.   Facility-Administered Encounter Medications as of  09/15/2017  Medication  . cyanocobalamin ((VITAMIN B-12)) injection 1,000 mcg    Review of Systems  Constitutional: Negative for appetite change.       Weight is down.    HENT: Negative for congestion and sinus pressure.   Respiratory: Negative for cough, chest tightness and shortness of breath.   Cardiovascular: Negative for chest pain and palpitations.  Gastrointestinal: Negative for abdominal pain, diarrhea, nausea and vomiting.  Genitourinary: Negative for difficulty urinating and dysuria.  Musculoskeletal: Negative for myalgias.       Persistent knee pain and swelling as outlined.   Skin: Negative for color change and rash.  Neurological: Negative for dizziness, light-headedness and headaches.  Psychiatric/Behavioral: Negative for agitation and dysphoric mood.       Objective:     Blood pressure rechecked by me;  134/82  Physical Exam  Constitutional: She appears well-developed and well-nourished. No distress.  HENT:  Nose: Nose normal.  Mouth/Throat: Oropharynx is clear and moist.  Neck: Neck supple. No thyromegaly present.  Cardiovascular: Normal rate and regular rhythm.  Pulmonary/Chest: Breath sounds normal. No respiratory distress. She has no wheezes.  Abdominal: Soft. Bowel sounds are normal. There is no tenderness.  Musculoskeletal: She exhibits no edema or tenderness.  Lymphadenopathy:    She has no cervical adenopathy.  Skin: No rash noted. No erythema.  Psychiatric: She has a normal mood and affect. Her behavior is normal.    BP 136/82 (BP Location: Left Arm, Patient Position: Sitting, Cuff Size: Normal)   Pulse 64   Temp 98.3 F (36.8 C) (Oral)   Resp 18   Wt 151 lb 3.2 oz (68.6 kg)   LMP 08/13/1995   SpO2 96%   BMI 25.16 kg/m  Wt Readings from Last 3 Encounters:  09/15/17 151 lb 3.2 oz (68.6 kg)  07/15/17 155 lb (70.3 kg)  03/27/17 159 lb 8 oz (72.3 kg)     Lab Results  Component Value Date   WBC 10.4 01/06/2017   HGB 12.6 01/06/2017   HCT  37.9 01/06/2017   PLT 201.0 01/06/2017   GLUCOSE 96 07/31/2017   CHOL 176 07/31/2017   TRIG 114.0 07/31/2017   HDL 47.80 07/31/2017   LDLDIRECT 132.5 05/06/2013   LDLCALC 105 (H) 07/31/2017   ALT 7 07/31/2017   AST 15 07/31/2017   NA 141 07/31/2017   K 3.9 07/31/2017   CL 108 07/31/2017   CREATININE 0.74 07/31/2017   BUN 14 07/31/2017   CO2 29 07/31/2017   TSH 2.05 12/30/2016   HGBA1C 5.7 10/19/2012    Mm  Screening Breast Tomo Bilateral  Result Date: 08/27/2017 CLINICAL DATA:  Screening. EXAM: 2D DIGITAL SCREENING BILATERAL MAMMOGRAM WITH 3D TOMO WITH CAD COMPARISON:  Previous exam(s). ACR Breast Density Category b: There are scattered areas of fibroglandular density. FINDINGS: There are no findings suspicious for malignancy. Images were processed with CAD. IMPRESSION: No mammographic evidence of malignancy. A result letter of this screening mammogram will be mailed directly to the patient. RECOMMENDATION: Screening mammogram in one year. (Code:SM-B-01Y) BI-RADS CATEGORY  1: Negative. Electronically Signed   By: Curlene Dolphin M.D.   On: 08/27/2017 13:21       Assessment & Plan:   Problem List Items Addressed This Visit    B12 deficiency    Continue b12 injections.       Crohn's disease ([place])    Doing well on current regimen.  Sees Dr Redmond Pulling.  Has f/u planned 01/2018.        Hypercholesterolemia    Low cholesterol diet and exercise.  Follow lipid panel.        Relevant Orders   Hepatic function panel   Lipid panel   Hypertension    Blood pressure doing better.  Outside checks as outlined.  Same medication regimen.  Follow pressures.  Follow metabolic panel.        Relevant Orders   CBC with Differential/Platelet   TSH   Basic metabolic panel   Knee pain    Persistent knee pain and swelling.  Planning to f/u with ortho for nerve block.         Other Visit Diagnoses    Vaccine counseling    -  Primary   discussed the new shingels vaccine.         Einar Pheasant, MD

## 2017-09-18 ENCOUNTER — Encounter: Payer: Self-pay | Admitting: Internal Medicine

## 2017-09-18 NOTE — Assessment & Plan Note (Signed)
Doing well on current regimen.  Sees Dr Redmond Pulling.  Has f/u planned 01/2018.

## 2017-09-18 NOTE — Assessment & Plan Note (Signed)
Low cholesterol diet and exercise.  Follow lipid panel.

## 2017-09-18 NOTE — Assessment & Plan Note (Signed)
Continue b12 injections.

## 2017-09-18 NOTE — Assessment & Plan Note (Signed)
Persistent knee pain and swelling.  Planning to f/u with ortho for nerve block.

## 2017-09-18 NOTE — Assessment & Plan Note (Signed)
Blood pressure doing better.  Outside checks as outlined.  Same medication regimen.  Follow pressures.  Follow metabolic panel.

## 2017-10-08 ENCOUNTER — Ambulatory Visit (INDEPENDENT_AMBULATORY_CARE_PROVIDER_SITE_OTHER): Payer: Medicare HMO

## 2017-10-08 DIAGNOSIS — E538 Deficiency of other specified B group vitamins: Secondary | ICD-10-CM

## 2017-10-08 MED ORDER — CYANOCOBALAMIN 1000 MCG/ML IJ SOLN
1000.0000 ug | Freq: Once | INTRAMUSCULAR | Status: AC
  Administered 2017-10-08: 1000 ug via INTRAMUSCULAR

## 2017-10-08 NOTE — Progress Notes (Addendum)
Patient presents today for b12 injection. Left deltoid. Patient voiced no concerns nor showed any signs of distress during injection.   Reviewed.  Dr Nicki Reaper

## 2017-10-14 DIAGNOSIS — Z96651 Presence of right artificial knee joint: Secondary | ICD-10-CM | POA: Diagnosis not present

## 2017-10-14 DIAGNOSIS — M25561 Pain in right knee: Secondary | ICD-10-CM | POA: Diagnosis not present

## 2017-11-10 ENCOUNTER — Ambulatory Visit (INDEPENDENT_AMBULATORY_CARE_PROVIDER_SITE_OTHER): Payer: Medicare HMO

## 2017-11-10 DIAGNOSIS — E538 Deficiency of other specified B group vitamins: Secondary | ICD-10-CM | POA: Diagnosis not present

## 2017-11-10 MED ORDER — CYANOCOBALAMIN 1000 MCG/ML IJ SOLN
1000.0000 ug | Freq: Once | INTRAMUSCULAR | Status: AC
  Administered 2017-11-10: 1000 ug via INTRAMUSCULAR

## 2017-11-10 NOTE — Progress Notes (Signed)
B 12 injection given in right deltoid. Patient tolerated well

## 2017-11-23 DIAGNOSIS — H401132 Primary open-angle glaucoma, bilateral, moderate stage: Secondary | ICD-10-CM | POA: Diagnosis not present

## 2017-11-26 DIAGNOSIS — H401132 Primary open-angle glaucoma, bilateral, moderate stage: Secondary | ICD-10-CM | POA: Diagnosis not present

## 2017-12-02 DIAGNOSIS — M25569 Pain in unspecified knee: Secondary | ICD-10-CM | POA: Diagnosis not present

## 2017-12-02 DIAGNOSIS — M545 Low back pain: Secondary | ICD-10-CM | POA: Diagnosis not present

## 2017-12-02 DIAGNOSIS — Z96651 Presence of right artificial knee joint: Secondary | ICD-10-CM | POA: Diagnosis not present

## 2017-12-02 DIAGNOSIS — M25561 Pain in right knee: Secondary | ICD-10-CM | POA: Diagnosis not present

## 2017-12-13 ENCOUNTER — Other Ambulatory Visit: Payer: Self-pay | Admitting: Internal Medicine

## 2017-12-15 ENCOUNTER — Other Ambulatory Visit (INDEPENDENT_AMBULATORY_CARE_PROVIDER_SITE_OTHER): Payer: Medicare HMO

## 2017-12-15 DIAGNOSIS — E7800 Pure hypercholesterolemia, unspecified: Secondary | ICD-10-CM | POA: Diagnosis not present

## 2017-12-15 DIAGNOSIS — I10 Essential (primary) hypertension: Secondary | ICD-10-CM

## 2017-12-15 LAB — CBC WITH DIFFERENTIAL/PLATELET
Basophils Absolute: 0 10*3/uL (ref 0.0–0.1)
Basophils Relative: 0.4 % (ref 0.0–3.0)
Eosinophils Absolute: 0.1 10*3/uL (ref 0.0–0.7)
Eosinophils Relative: 1.2 % (ref 0.0–5.0)
HCT: 36.2 % (ref 36.0–46.0)
Hemoglobin: 12 g/dL (ref 12.0–15.0)
Lymphocytes Relative: 20.9 % (ref 12.0–46.0)
Lymphs Abs: 1.8 10*3/uL (ref 0.7–4.0)
MCHC: 33.2 g/dL (ref 30.0–36.0)
MCV: 95 fl (ref 78.0–100.0)
Monocytes Absolute: 0.7 10*3/uL (ref 0.1–1.0)
Monocytes Relative: 7.9 % (ref 3.0–12.0)
Neutro Abs: 5.9 10*3/uL (ref 1.4–7.7)
Neutrophils Relative %: 69.6 % (ref 43.0–77.0)
Platelets: 199 10*3/uL (ref 150.0–400.0)
RBC: 3.81 Mil/uL — ABNORMAL LOW (ref 3.87–5.11)
RDW: 13.8 % (ref 11.5–15.5)
WBC: 8.5 10*3/uL (ref 4.0–10.5)

## 2017-12-15 LAB — TSH: TSH: 2.11 u[IU]/mL (ref 0.35–4.50)

## 2017-12-15 LAB — BASIC METABOLIC PANEL
BUN: 13 mg/dL (ref 6–23)
CO2: 29 mEq/L (ref 19–32)
Calcium: 9.2 mg/dL (ref 8.4–10.5)
Chloride: 109 mEq/L (ref 96–112)
Creatinine, Ser: 0.74 mg/dL (ref 0.40–1.20)
GFR: 99.19 mL/min (ref >60.00)
Glucose, Bld: 98 mg/dL (ref 70–99)
Potassium: 3.8 mEq/L (ref 3.5–5.1)
Sodium: 143 mEq/L (ref 135–145)

## 2017-12-15 LAB — HEPATIC FUNCTION PANEL
ALT: 9 U/L (ref 0–35)
AST: 20 U/L (ref 0–37)
Albumin: 3.9 g/dL (ref 3.5–5.2)
Alkaline Phosphatase: 72 U/L (ref 39–117)
Bilirubin, Direct: 0 mg/dL (ref 0.0–0.3)
Total Bilirubin: 0.3 mg/dL (ref 0.2–1.2)
Total Protein: 6.7 g/dL (ref 6.0–8.3)

## 2017-12-15 LAB — LIPID PANEL
Cholesterol: 175 mg/dL (ref 0–200)
HDL: 50.9 mg/dL (ref >39.00)
LDL Cholesterol: 105 mg/dL — ABNORMAL HIGH (ref 0–99)
NonHDL: 123.66
Total CHOL/HDL Ratio: 3
Triglycerides: 94 mg/dL (ref 0.0–149.0)
VLDL: 18.8 mg/dL (ref 0.0–40.0)

## 2017-12-17 ENCOUNTER — Encounter: Payer: Self-pay | Admitting: Internal Medicine

## 2017-12-17 ENCOUNTER — Ambulatory Visit (INDEPENDENT_AMBULATORY_CARE_PROVIDER_SITE_OTHER): Payer: Medicare HMO | Admitting: Internal Medicine

## 2017-12-17 VITALS — BP 138/78 | HR 59 | Temp 97.6°F | Resp 18 | Wt 151.6 lb

## 2017-12-17 DIAGNOSIS — Z0100 Encounter for examination of eyes and vision without abnormal findings: Secondary | ICD-10-CM | POA: Diagnosis not present

## 2017-12-17 DIAGNOSIS — K5010 Crohn's disease of large intestine without complications: Secondary | ICD-10-CM | POA: Diagnosis not present

## 2017-12-17 DIAGNOSIS — M25561 Pain in right knee: Secondary | ICD-10-CM | POA: Diagnosis not present

## 2017-12-17 DIAGNOSIS — J029 Acute pharyngitis, unspecified: Secondary | ICD-10-CM | POA: Diagnosis not present

## 2017-12-17 DIAGNOSIS — I10 Essential (primary) hypertension: Secondary | ICD-10-CM

## 2017-12-17 DIAGNOSIS — M79675 Pain in left toe(s): Secondary | ICD-10-CM | POA: Diagnosis not present

## 2017-12-17 DIAGNOSIS — E7800 Pure hypercholesterolemia, unspecified: Secondary | ICD-10-CM | POA: Diagnosis not present

## 2017-12-17 DIAGNOSIS — M79674 Pain in right toe(s): Secondary | ICD-10-CM | POA: Diagnosis not present

## 2017-12-17 DIAGNOSIS — E538 Deficiency of other specified B group vitamins: Secondary | ICD-10-CM

## 2017-12-17 DIAGNOSIS — R69 Illness, unspecified: Secondary | ICD-10-CM | POA: Diagnosis not present

## 2017-12-17 MED ORDER — ROSUVASTATIN CALCIUM 10 MG PO TABS: 10.0000 mg | ORAL_TABLET | Freq: Every day | ORAL | 2 refills | Status: DC

## 2017-12-17 NOTE — Patient Instructions (Signed)
Saline nasal spray - flush nose at least 2-3x/day  nasacort nasal spray - 2 sprays each nostril one time per day.  Do this in the evening.    Let me know if the sore throat persist.

## 2017-12-17 NOTE — Progress Notes (Signed)
Patient ID: Ellen Drake, female   DOB: 05/31/1946, 72 y.o.   MRN: [id number]   Subjective:    Patient ID: Ellen Drake, female    DOB: 06-29-1946, 72 y.o.   MRN: [id number]  HPI  Patient here for a scheduled follow up.  Still having increased pain and swelling in her knee. Has seen multiple specialists.  Planning for "block" 01/24/18.  Other than her knee, she reports she is doing well.  No chest pain.  No sob.  No acid reflux.  No abdominal pain.  Bowels doing well.  No recent flares with her Crohn's.  States her blood pressure has been averaging 134/80-82.  Discussed lab results.  Discussed calculated cholesterol risk (12.5%).  Discussed starting cholesterol medication.  Sore throat in am.  Some drainage.  Sore throat better through the day.     Past Medical History:  Diagnosis Date  . Anemia    reslved  . Colon polyp 2009  . Crohn's disease ([place])   . Diverticulosis   . Fibrocystic breast disease   . Herniated nucleus pulposus of lumbosacral region   . History of chicken pox   . Hypercholesterolemia   . Hyperlipidemia   . Hypertension   . Osteoarthritis   . Pulmonary nodule seen on imaging study 2004   resolved 2007   Past Surgical History:  Procedure Laterality Date  . anterior cervical discectomy and fusion  1998   Dr Rolin Barry  . BACK SURGERY  1992   ruptured disc (Dr Mauri Pole)  . BREAST BIOPSY Left    neg-no scar seen  . COLECTOMY  2003  . COLON SURGERY  1998  . COLONOSCOPY  2009  . COLONOSCOPY W/ BIOPSIES  11/30/2012   Procedure: COLONOSCOPY W/BIOPSY; Surgeon: Colvin Caroli, MD; Location: [place]; Service: Gastroenterology;;  . COLONOSCOPY W/ BIOPSIES  04/15/2016   Procedure: Colonoscopy ; Surgeon: Colvin Caroli, MD; Location: [place]; Service: Gastroenterology; Laterality: N/A;  . KNEE ARTHROSCOPY W/ SYNOVECTOMY Right 03/10/2014   limited  . KNEE SURGERY Right 1/16   [facility] (Dr. Sherlynn Carbon)  . POSTERIOR LAMINECTOMY /  DECOMPRESSION LUMBAR SPINE  1999  . REPLACEMENT TOTAL KNEE    . ROTATOR CUFF REPAIR Left 2007   acute open; accident   Family History  Problem Relation Age of Onset  . CVA Mother   . Hypertension Mother   . Hypertension Sister        brain tumor  . Breast cancer Neg Hx   . Colon cancer Neg Hx    Social History   Socioeconomic History  . Marital status: Married    Spouse name: Not on file  . Number of children: 0  . Years of education: 86  . Highest education level: Not on file  Occupational History  . Not on file  Social Needs  . Financial resource strain: Not on file  . Food insecurity:    Worry: Not on file    Inability: Not on file  . Transportation needs:    Medical: Not on file    Non-medical: Not on file  Tobacco Use  . Smoking status: Former Smoker    Last attempt to quit: 09/10/2009    Years since quitting: 8.2  . Smokeless tobacco: Never Used  Substance and Sexual Activity  . Alcohol use: Yes    Alcohol/week: 0.6 oz    Types: 1 Glasses of wine per week    Comment: wine occasional; socially  . Drug  use: No  . Sexual activity: Yes  Lifestyle  . Physical activity:    Days per week: Not on file    Minutes per session: Not on file  . Stress: Not on file  Relationships  . Social connections:    Talks on phone: Not on file    Gets together: Not on file    Attends religious service: Not on file    Active member of club or organization: Not on file    Attends meetings of clubs or organizations: Not on file    Relationship status: Not on file  Other Topics Concern  . Not on file  Social History Narrative  . Not on file    Outpatient Encounter Medications as of 12/17/2017  Medication Sig  . amLODipine (NORVASC) 5 MG tablet Take 1 tablet (5 mg total) by mouth daily.  Marland Kitchen atenolol (TENORMIN) 50 MG tablet TAKE 1 TABLET (50 MG TOTAL) BY MOUTH DAILY.  . brimonidine (ALPHAGAN) 0.2 % ophthalmic solution 1 drop 2 (two) times daily.   Marland Kitchen HYDROmorphone (DILAUDID) 2  MG tablet Take 1-2 tablets (2-4 mg total) by mouth every 3 (three) hours as needed. 1-2 tablets (67m - 4 mg)  . latanoprost (XALATAN) 0.005 % ophthalmic solution Place 1 drop into both eyes at bedtime.   .Marland Kitchenlisinopril (PRINIVIL,ZESTRIL) 40 MG tablet TAKE 1 TABLET (40 MG TOTAL) BY MOUTH DAILY.  . rosuvastatin (CRESTOR) 10 MG tablet Take 1 tablet (10 mg total) by mouth daily.  .Marland KitchensulfaSALAzine (AZULFIDINE) 500 MG tablet Take 1,500 mg by mouth 2 (two) times daily. 3 tabs  . timolol (BETIMOL) 0.5 % ophthalmic solution 1 drop 2 (two) times daily.  . timolol (TIMOPTIC) 0.5 % ophthalmic solution Place 1 drop into both eyes 2 (two) times daily.   Facility-Administered Encounter Medications as of 12/17/2017  Medication  . cyanocobalamin ((VITAMIN B-12)) injection 1,000 mcg    Review of Systems  Constitutional: Negative for appetite change and unexpected weight change.  HENT: Negative for congestion and sinus pressure.   Respiratory: Negative for cough, chest tightness and shortness of breath.   Cardiovascular: Negative for chest pain and palpitations.  Gastrointestinal: Negative for abdominal pain, diarrhea, nausea and vomiting.  Genitourinary: Negative for difficulty urinating and dysuria.  Musculoskeletal: Negative for myalgias.       Left knee pain and swelling.   Skin: Negative for color change and rash.  Neurological: Negative for dizziness, light-headedness and headaches.  Psychiatric/Behavioral: Negative for agitation and dysphoric mood.       Objective:     Blood pressure rechecked by me:  138/78  Physical Exam  HENT:  Nose: Nose normal.  Mouth/Throat: Oropharynx is clear and moist.  Neck: Neck supple. No thyromegaly present.  Cardiovascular: Normal rate and regular rhythm.  Pulmonary/Chest: Breath sounds normal. No respiratory distress. She has no wheezes.  Abdominal: Soft. Bowel sounds are normal. There is no tenderness.  Musculoskeletal: She exhibits no edema or tenderness.    Lymphadenopathy:    She has no cervical adenopathy.  Skin: No rash noted. No erythema.  Psychiatric: She has a normal mood and affect. Her behavior is normal.    BP 138/78   Pulse (!) 59   Temp 97.6 F (36.4 C) (Oral)   Resp 18   Wt 151 lb 9.6 oz (68.8 kg)   LMP 08/13/1995   SpO2 97%   BMI 25.23 kg/m  Wt Readings from Last 3 Encounters:  12/17/17 151 lb 9.6 oz (68.8 kg)  09/15/17 151  lb 3.2 oz (68.6 kg)  07/15/17 155 lb (70.3 kg)     Lab Results  Component Value Date   WBC 8.5 12/15/2017   HGB 12.0 12/15/2017   HCT 36.2 12/15/2017   PLT 199.0 12/15/2017   GLUCOSE 98 12/15/2017   CHOL 175 12/15/2017   TRIG 94.0 12/15/2017   HDL 50.90 12/15/2017   LDLDIRECT 132.5 05/06/2013   LDLCALC 105 (H) 12/15/2017   ALT 9 12/15/2017   AST 20 12/15/2017   NA 143 12/15/2017   K 3.8 12/15/2017   CL 109 12/15/2017   CREATININE 0.74 12/15/2017   BUN 13 12/15/2017   CO2 29 12/15/2017   TSH 2.11 12/15/2017   HGBA1C 5.7 10/19/2012    Mm Screening Breast Tomo Bilateral  Result Date: 08/27/2017 CLINICAL DATA:  Screening. EXAM: 2D DIGITAL SCREENING BILATERAL MAMMOGRAM WITH 3D TOMO WITH CAD COMPARISON:  Previous exam(s). ACR Breast Density Category b: There are scattered areas of fibroglandular density. FINDINGS: There are no findings suspicious for malignancy. Images were processed with CAD. IMPRESSION: No mammographic evidence of malignancy. A result letter of this screening mammogram will be mailed directly to the patient. RECOMMENDATION: Screening mammogram in one year. (Code:SM-B-01Y) BI-RADS CATEGORY  1: Negative. Electronically Signed   By: Curlene Dolphin M.D.   On: 08/27/2017 13:21       Assessment & Plan:   Problem List Items Addressed This Visit    B12 deficiency    Continue B12 injections.       Crohn's disease ([place])    Followed by Dr Redmond Pulling.  Has f/u scheduled next month.  Bowels doing well.        Hypercholesterolemia    Low cholesterol diet and exercise.   Discussed calculated cholesterol risk.  Start crestor.  Follow lipid panel and liver function tests.        Relevant Medications   rosuvastatin (CRESTOR) 10 MG tablet   Other Relevant Orders   Hepatic function panel   Hypertension    Blood pressure under good control.  Continue same medication regimen.  Follow pressures.  Follow metabolic panel.        Relevant Medications   rosuvastatin (CRESTOR) 10 MG tablet   Knee pain    Persistent knee pain and swelling.  Has seen multiple specialists.  Planning for procedure in 01/2018 to try and help with pain.         Other Visit Diagnoses    Sore throat    -  Primary   Notices in am.  Better throughout the day.  Saline nasal spray and nasacort nasal spray as directed.  Follow.  No acid reflux.  Notify if persistent symptoms.       Einar Pheasant, MD

## 2017-12-20 ENCOUNTER — Encounter: Payer: Self-pay | Admitting: Internal Medicine

## 2017-12-20 NOTE — Assessment & Plan Note (Signed)
Persistent knee pain and swelling.  Has seen multiple specialists.  Planning for procedure in 01/2018 to try and help with pain.

## 2017-12-20 NOTE — Assessment & Plan Note (Signed)
Followed by Dr Redmond Pulling.  Has f/u scheduled next month.  Bowels doing well.

## 2017-12-20 NOTE — Assessment & Plan Note (Signed)
Continue B12 injections.

## 2017-12-20 NOTE — Assessment & Plan Note (Signed)
Low cholesterol diet and exercise.  Discussed calculated cholesterol risk.  Start crestor.  Follow lipid panel and liver function tests.

## 2017-12-20 NOTE — Assessment & Plan Note (Signed)
Blood pressure under good control.  Continue same medication regimen.  Follow pressures.  Follow metabolic panel.   

## 2017-12-28 ENCOUNTER — Other Ambulatory Visit: Payer: Self-pay | Admitting: Internal Medicine

## 2017-12-30 DIAGNOSIS — Z79899 Other long term (current) drug therapy: Secondary | ICD-10-CM | POA: Diagnosis not present

## 2018-01-11 DIAGNOSIS — K5010 Crohn's disease of large intestine without complications: Secondary | ICD-10-CM | POA: Diagnosis not present

## 2018-01-11 DIAGNOSIS — Z86010 Personal history of colonic polyps: Secondary | ICD-10-CM | POA: Diagnosis not present

## 2018-01-11 DIAGNOSIS — M1711 Unilateral primary osteoarthritis, right knee: Secondary | ICD-10-CM | POA: Diagnosis not present

## 2018-01-25 DIAGNOSIS — M25561 Pain in right knee: Secondary | ICD-10-CM | POA: Diagnosis not present

## 2018-02-02 DIAGNOSIS — H409 Unspecified glaucoma: Secondary | ICD-10-CM | POA: Diagnosis not present

## 2018-02-02 DIAGNOSIS — Z87891 Personal history of nicotine dependence: Secondary | ICD-10-CM | POA: Diagnosis not present

## 2018-02-02 DIAGNOSIS — G8929 Other chronic pain: Secondary | ICD-10-CM | POA: Diagnosis not present

## 2018-02-02 DIAGNOSIS — I10 Essential (primary) hypertension: Secondary | ICD-10-CM | POA: Diagnosis not present

## 2018-02-02 DIAGNOSIS — E785 Hyperlipidemia, unspecified: Secondary | ICD-10-CM | POA: Diagnosis not present

## 2018-02-02 DIAGNOSIS — Z79891 Long term (current) use of opiate analgesic: Secondary | ICD-10-CM | POA: Diagnosis not present

## 2018-02-02 DIAGNOSIS — Z8249 Family history of ischemic heart disease and other diseases of the circulatory system: Secondary | ICD-10-CM | POA: Diagnosis not present

## 2018-02-02 DIAGNOSIS — K5090 Crohn's disease, unspecified, without complications: Secondary | ICD-10-CM | POA: Diagnosis not present

## 2018-02-04 ENCOUNTER — Other Ambulatory Visit (INDEPENDENT_AMBULATORY_CARE_PROVIDER_SITE_OTHER): Payer: Medicare HMO

## 2018-02-04 DIAGNOSIS — E7800 Pure hypercholesterolemia, unspecified: Secondary | ICD-10-CM | POA: Diagnosis not present

## 2018-02-04 LAB — HEPATIC FUNCTION PANEL
ALT: 14 U/L (ref 0–35)
AST: 24 U/L (ref 0–37)
Albumin: 4.2 g/dL (ref 3.5–5.2)
Alkaline Phosphatase: 71 U/L (ref 39–117)
Bilirubin, Direct: 0.1 mg/dL (ref 0.0–0.3)
Total Bilirubin: 0.3 mg/dL (ref 0.2–1.2)
Total Protein: 7 g/dL (ref 6.0–8.3)

## 2018-02-24 DIAGNOSIS — M545 Low back pain: Secondary | ICD-10-CM | POA: Diagnosis not present

## 2018-02-25 DIAGNOSIS — H401132 Primary open-angle glaucoma, bilateral, moderate stage: Secondary | ICD-10-CM | POA: Diagnosis not present

## 2018-03-06 ENCOUNTER — Other Ambulatory Visit: Payer: Self-pay | Admitting: Internal Medicine

## 2018-03-10 ENCOUNTER — Other Ambulatory Visit: Payer: Self-pay | Admitting: Internal Medicine

## 2018-04-07 DIAGNOSIS — M545 Low back pain: Secondary | ICD-10-CM | POA: Diagnosis not present

## 2018-04-21 ENCOUNTER — Other Ambulatory Visit: Payer: Self-pay | Admitting: Internal Medicine

## 2018-04-21 ENCOUNTER — Telehealth: Payer: Self-pay | Admitting: Radiology

## 2018-04-21 DIAGNOSIS — Z96651 Presence of right artificial knee joint: Secondary | ICD-10-CM | POA: Diagnosis not present

## 2018-04-21 DIAGNOSIS — E7800 Pure hypercholesterolemia, unspecified: Secondary | ICD-10-CM

## 2018-04-21 DIAGNOSIS — M25561 Pain in right knee: Secondary | ICD-10-CM | POA: Diagnosis not present

## 2018-04-21 DIAGNOSIS — M89061 Algoneurodystrophy, right lower leg: Secondary | ICD-10-CM | POA: Diagnosis not present

## 2018-04-21 DIAGNOSIS — I10 Essential (primary) hypertension: Secondary | ICD-10-CM

## 2018-04-21 NOTE — Telephone Encounter (Signed)
Orders placed for f/u labs.  

## 2018-04-21 NOTE — Telephone Encounter (Signed)
Pt coming in for labs Monday, please place future orders. Thank you 

## 2018-04-21 NOTE — Progress Notes (Signed)
Orders placed for f/u labs.  

## 2018-04-23 ENCOUNTER — Other Ambulatory Visit: Payer: Self-pay | Admitting: Internal Medicine

## 2018-04-26 ENCOUNTER — Other Ambulatory Visit (INDEPENDENT_AMBULATORY_CARE_PROVIDER_SITE_OTHER): Payer: Medicare HMO

## 2018-04-26 DIAGNOSIS — I10 Essential (primary) hypertension: Secondary | ICD-10-CM | POA: Diagnosis not present

## 2018-04-26 DIAGNOSIS — E7800 Pure hypercholesterolemia, unspecified: Secondary | ICD-10-CM

## 2018-04-26 LAB — HEPATIC FUNCTION PANEL
ALT: 23 U/L (ref 0–35)
AST: 28 U/L (ref 0–37)
Albumin: 4.2 g/dL (ref 3.5–5.2)
Alkaline Phosphatase: 76 U/L (ref 39–117)
Bilirubin, Direct: 0.1 mg/dL (ref 0.0–0.3)
Total Bilirubin: 0.4 mg/dL (ref 0.2–1.2)
Total Protein: 7 g/dL (ref 6.0–8.3)

## 2018-04-26 LAB — BASIC METABOLIC PANEL
BUN: 14 mg/dL (ref 6–23)
CO2: 31 mEq/L (ref 19–32)
Calcium: 9.4 mg/dL (ref 8.4–10.5)
Chloride: 108 mEq/L (ref 96–112)
Creatinine, Ser: 0.76 mg/dL (ref 0.40–1.20)
GFR: 96.09 mL/min (ref >60.00)
Glucose, Bld: 92 mg/dL (ref 70–99)
Potassium: 4 mEq/L (ref 3.5–5.1)
Sodium: 142 mEq/L (ref 135–145)

## 2018-04-26 LAB — LIPID PANEL
Cholesterol: 134 mg/dL (ref 0–200)
HDL: 61.8 mg/dL (ref >39.00)
LDL Cholesterol: 59 mg/dL (ref 0–99)
NonHDL: 71.91
Total CHOL/HDL Ratio: 2
Triglycerides: 65 mg/dL (ref 0.0–149.0)
VLDL: 13 mg/dL (ref 0.0–40.0)

## 2018-04-29 ENCOUNTER — Other Ambulatory Visit: Payer: Self-pay | Admitting: Internal Medicine

## 2018-04-29 ENCOUNTER — Ambulatory Visit (INDEPENDENT_AMBULATORY_CARE_PROVIDER_SITE_OTHER): Payer: Medicare HMO | Admitting: Internal Medicine

## 2018-04-29 ENCOUNTER — Encounter: Payer: Self-pay | Admitting: Internal Medicine

## 2018-04-29 VITALS — BP 150/82 | HR 60 | Temp 97.6°F | Resp 18 | Wt 152.2 lb

## 2018-04-29 DIAGNOSIS — E538 Deficiency of other specified B group vitamins: Secondary | ICD-10-CM | POA: Diagnosis not present

## 2018-04-29 DIAGNOSIS — E7800 Pure hypercholesterolemia, unspecified: Secondary | ICD-10-CM | POA: Diagnosis not present

## 2018-04-29 DIAGNOSIS — K5010 Crohn's disease of large intestine without complications: Secondary | ICD-10-CM | POA: Diagnosis not present

## 2018-04-29 DIAGNOSIS — I10 Essential (primary) hypertension: Secondary | ICD-10-CM | POA: Diagnosis not present

## 2018-04-29 DIAGNOSIS — Z23 Encounter for immunization: Secondary | ICD-10-CM

## 2018-04-29 DIAGNOSIS — M25561 Pain in right knee: Secondary | ICD-10-CM | POA: Diagnosis not present

## 2018-04-29 NOTE — Progress Notes (Signed)
Patient ID: Ellen Drake, female   DOB: [birth year], 72 y.o.   MRN: [id number]   Subjective:    Patient ID: Ellen Drake, female    DOB: [birth year], 72 y.o.   MRN: [id number]  HPI  Patient here for a scheduled follow up.  Still having persistent problems with pain and swelling in her right knee.  Has seen multiple specialists.  Recently had a "block" to try and alleviate some of the discomfort.  Did not help.  She is due to see Dr Joselyn Glassman soon to discuss.  States other than her knee, she is doing well.  No abdominal pain. No bowel change.  Recently saw Dr Redmond Pulling. Felt stable.  No chest pain.  No sob.  No acid reflux.  Discussed her blood pressure.  Has been doing better.  Elevated today.     Past Medical History:  Diagnosis Date  . Anemia    reslved  . Colon polyp 2009  . Crohn's disease ([place])   . Diverticulosis   . Fibrocystic breast disease   . Herniated nucleus pulposus of lumbosacral region   . History of chicken pox   . Hypercholesterolemia   . Hyperlipidemia   . Hypertension   . Osteoarthritis   . Pulmonary nodule seen on imaging study 2004   resolved 2007   Past Surgical History:  Procedure Laterality Date  . anterior cervical discectomy and fusion  1998   Dr Rolin Barry  . BACK SURGERY  1992   ruptured disc (Dr Mauri Pole)  . BREAST BIOPSY Left    neg-no scar seen  . COLECTOMY  2003  . COLON SURGERY  1998  . COLONOSCOPY  2009  . COLONOSCOPY W/ BIOPSIES  11/30/2012   Procedure: COLONOSCOPY W/BIOPSY; Surgeon: Colvin Caroli, MD; Location: [place]; Service: Gastroenterology;;  . COLONOSCOPY W/ BIOPSIES  04/15/2016   Procedure: Colonoscopy ; Surgeon: Colvin Caroli, MD; Location: [place]; Service: Gastroenterology; Laterality: N/A;  . KNEE ARTHROSCOPY W/ SYNOVECTOMY Right 03/10/2014   limited  . KNEE SURGERY Right 1/16   [facility] (Dr. Sherlynn Carbon)  . POSTERIOR LAMINECTOMY / DECOMPRESSION LUMBAR SPINE  1999  . REPLACEMENT  TOTAL KNEE    . ROTATOR CUFF REPAIR Left 2007   acute open; accident   Family History  Problem Relation Age of Onset  . CVA Mother   . Hypertension Mother   . Hypertension Sister        brain tumor  . Breast cancer Neg Hx   . Colon cancer Neg Hx    Social History   Socioeconomic History  . Marital status: Married    Spouse name: Not on file  . Number of children: 0  . Years of education: 8  . Highest education level: Not on file  Occupational History  . Not on file  Social Needs  . Financial resource strain: Not on file  . Food insecurity:    Worry: Not on file    Inability: Not on file  . Transportation needs:    Medical: Not on file    Non-medical: Not on file  Tobacco Use  . Smoking status: Former Smoker    Last attempt to quit: 09/10/2009    Years since quitting: 8.6  . Smokeless tobacco: Never Used  Substance and Sexual Activity  . Alcohol use: Yes    Alcohol/week: 1.0 standard drinks    Types: 1 Glasses of wine per week    Comment: wine occasional; socially  .  Drug use: No  . Sexual activity: Yes  Lifestyle  . Physical activity:    Days per week: Not on file    Minutes per session: Not on file  . Stress: Not on file  Relationships  . Social connections:    Talks on phone: Not on file    Gets together: Not on file    Attends religious service: Not on file    Active member of club or organization: Not on file    Attends meetings of clubs or organizations: Not on file    Relationship status: Not on file  Other Topics Concern  . Not on file  Social History Narrative  . Not on file    Outpatient Encounter Medications as of 04/29/2018  Medication Sig  . amLODipine (NORVASC) 5 MG tablet TAKE 1 TABLET BY MOUTH EVERY DAY  . atenolol (TENORMIN) 50 MG tablet TAKE 1 TABLET (50 MG TOTAL) BY MOUTH DAILY.  . brimonidine (ALPHAGAN) 0.2 % ophthalmic solution 1 drop 2 (two) times daily.   Marland Kitchen HYDROmorphone (DILAUDID) 2 MG tablet Take 1-2 tablets (2-4 mg total) by  mouth every 3 (three) hours as needed. 1-2 tablets (29m - 4 mg)  . latanoprost (XALATAN) 0.005 % ophthalmic solution Place 1 drop into both eyes at bedtime.   .Marland Kitchenlisinopril (PRINIVIL,ZESTRIL) 40 MG tablet TAKE 1 TABLET (40 MG TOTAL) BY MOUTH DAILY.  . rosuvastatin (CRESTOR) 10 MG tablet TAKE 1 TABLET BY MOUTH EVERY DAY  . sulfaSALAzine (AZULFIDINE) 500 MG tablet Take 1,500 mg by mouth 2 (two) times daily. 3 tabs  . timolol (BETIMOL) 0.5 % ophthalmic solution 1 drop 2 (two) times daily.  . timolol (TIMOPTIC) 0.5 % ophthalmic solution Place 1 drop into both eyes 2 (two) times daily.   Facility-Administered Encounter Medications as of 04/29/2018  Medication  . cyanocobalamin ((VITAMIN B-12)) injection 1,000 mcg    Review of Systems  Constitutional: Negative for appetite change and unexpected weight change.  HENT: Negative for congestion and sinus pressure.   Respiratory: Negative for cough, chest tightness and shortness of breath.   Cardiovascular: Negative for chest pain and palpitations.  Gastrointestinal: Negative for abdominal pain, diarrhea, nausea and vomiting.  Genitourinary: Negative for difficulty urinating and dysuria.  Musculoskeletal: Negative for myalgias.       Persistent knee pain and swelling - right.    Skin: Negative for color change and rash.  Neurological: Negative for dizziness, light-headedness and headaches.  Psychiatric/Behavioral: Negative for agitation and dysphoric mood.       Objective:    Physical Exam  Constitutional: She appears well-developed and well-nourished. No distress.  HENT:  Nose: Nose normal.  Mouth/Throat: Oropharynx is clear and moist.  Neck: Neck supple. No thyromegaly present.  Cardiovascular: Normal rate and regular rhythm.  Pulmonary/Chest: Breath sounds normal. No respiratory distress. She has no wheezes.  Abdominal: Soft. Bowel sounds are normal. There is no tenderness.  Musculoskeletal:  Increased soft tissue swelling - right knee.   No increased erythema.    Lymphadenopathy:    She has no cervical adenopathy.  Skin: No rash noted. No erythema.  Psychiatric: She has a normal mood and affect. Her behavior is normal.    BP (!) 150/82 (BP Location: Left Arm, Patient Position: Sitting, Cuff Size: Normal)   Pulse 60   Temp 97.6 F (36.4 C) (Oral)   Resp 18   Wt 152 lb 3.2 oz (69 kg)   LMP 08/13/1995   SpO2 98%   BMI 25.33 kg/m  Wt Readings from Last 3 Encounters:  04/29/18 152 lb 3.2 oz (69 kg)  12/17/17 151 lb 9.6 oz (68.8 kg)  09/15/17 151 lb 3.2 oz (68.6 kg)     Lab Results  Component Value Date   WBC 8.5 12/15/2017   HGB 12.0 12/15/2017   HCT 36.2 12/15/2017   PLT 199.0 12/15/2017   GLUCOSE 92 04/26/2018   CHOL 134 04/26/2018   TRIG 65.0 04/26/2018   HDL 61.80 04/26/2018   LDLDIRECT 132.5 05/06/2013   LDLCALC 59 04/26/2018   ALT 23 04/26/2018   AST 28 04/26/2018   NA 142 04/26/2018   K 4.0 04/26/2018   CL 108 04/26/2018   CREATININE 0.76 04/26/2018   BUN 14 04/26/2018   CO2 31 04/26/2018   TSH 2.11 12/15/2017   HGBA1C 5.7 10/19/2012    Mm Screening Breast Tomo Bilateral  Result Date: 08/27/2017 CLINICAL DATA:  Screening. EXAM: 2D DIGITAL SCREENING BILATERAL MAMMOGRAM WITH 3D TOMO WITH CAD COMPARISON:  Previous exam(s). ACR Breast Density Category b: There are scattered areas of fibroglandular density. FINDINGS: There are no findings suspicious for malignancy. Images were processed with CAD. IMPRESSION: No mammographic evidence of malignancy. A result letter of this screening mammogram will be mailed directly to the patient. RECOMMENDATION: Screening mammogram in one year. (Code:SM-B-01Y) BI-RADS CATEGORY  1: Negative. Electronically Signed   By: Curlene Dolphin M.D.   On: 08/27/2017 13:21       Assessment & Plan:   Problem List Items Addressed This Visit    B12 deficiency    Continue B12 injections.        Crohn's disease ([place])    Followed by Dr Evalee Mutton.  Just evaluated.  Stable.   Continue sulfasalazine.        Hypercholesterolemia    On crestor.  Low cholesterol diet and exercise.  Follow lipid panel and liver function tests.        Relevant Orders   Hepatic function panel   Lipid panel   Hypertension    Blood pressure has been doing better.  Elevated. Today.  Hold on making changes. Have her spot check her pressure.  Get her back in soon to reassess.        Relevant Orders   Basic metabolic panel   Knee pain    Persistent knee pain and swelling.  Has seen multiple specialists.  Has an appt with Dr Joselyn Glassman to discuss.         Other Visit Diagnoses    Need for influenza vaccination    -  Primary   Relevant Orders   Flu vaccine HIGH DOSE PF (Fluzone High dose) (Completed)       Einar Pheasant, MD

## 2018-05-02 ENCOUNTER — Encounter: Payer: Self-pay | Admitting: Internal Medicine

## 2018-05-02 NOTE — Assessment & Plan Note (Signed)
Blood pressure has been doing better.  Elevated. Today.  Hold on making changes. Have her spot check her pressure.  Get her back in soon to reassess.

## 2018-05-02 NOTE — Assessment & Plan Note (Signed)
Continue B12 injections.

## 2018-05-02 NOTE — Assessment & Plan Note (Signed)
Followed by Dr Evalee Mutton.  Just evaluated.  Stable.  Continue sulfasalazine.

## 2018-05-02 NOTE — Assessment & Plan Note (Signed)
On crestor.  Low cholesterol diet and exercise.  Follow lipid panel and liver function tests.

## 2018-05-02 NOTE — Assessment & Plan Note (Signed)
Persistent knee pain and swelling.  Has seen multiple specialists.  Has an appt with Dr Joselyn Glassman to discuss.

## 2018-06-01 ENCOUNTER — Other Ambulatory Visit: Payer: Self-pay | Admitting: Internal Medicine

## 2018-06-07 ENCOUNTER — Other Ambulatory Visit: Payer: Self-pay | Admitting: Internal Medicine

## 2018-06-11 ENCOUNTER — Other Ambulatory Visit: Payer: Self-pay | Admitting: Internal Medicine

## 2018-06-16 ENCOUNTER — Other Ambulatory Visit: Payer: Self-pay | Admitting: Internal Medicine

## 2018-06-25 DIAGNOSIS — H401132 Primary open-angle glaucoma, bilateral, moderate stage: Secondary | ICD-10-CM | POA: Diagnosis not present

## 2018-08-16 ENCOUNTER — Other Ambulatory Visit (INDEPENDENT_AMBULATORY_CARE_PROVIDER_SITE_OTHER): Payer: Medicare HMO

## 2018-08-16 DIAGNOSIS — E7800 Pure hypercholesterolemia, unspecified: Secondary | ICD-10-CM | POA: Diagnosis not present

## 2018-08-16 DIAGNOSIS — I10 Essential (primary) hypertension: Secondary | ICD-10-CM | POA: Diagnosis not present

## 2018-08-16 LAB — LIPID PANEL
Cholesterol: 157 mg/dL (ref 0–200)
HDL: 69 mg/dL (ref >39.00)
LDL Cholesterol: 68 mg/dL (ref 0–99)
NonHDL: 88.12
Total CHOL/HDL Ratio: 2
Triglycerides: 103 mg/dL (ref 0.0–149.0)
VLDL: 20.6 mg/dL (ref 0.0–40.0)

## 2018-08-16 LAB — BASIC METABOLIC PANEL
BUN: 13 mg/dL (ref 6–23)
CO2: 29 mEq/L (ref 19–32)
Calcium: 9.6 mg/dL (ref 8.4–10.5)
Chloride: 109 mEq/L (ref 96–112)
Creatinine, Ser: 0.77 mg/dL (ref 0.40–1.20)
GFR: 94.57 mL/min (ref >60.00)
Glucose, Bld: 102 mg/dL — ABNORMAL HIGH (ref 70–99)
Potassium: 4.1 mEq/L (ref 3.5–5.1)
Sodium: 143 mEq/L (ref 135–145)

## 2018-08-16 LAB — HEPATIC FUNCTION PANEL
ALT: 28 U/L (ref 0–35)
AST: 32 U/L (ref 0–37)
Albumin: 4.2 g/dL (ref 3.5–5.2)
Alkaline Phosphatase: 72 U/L (ref 39–117)
Bilirubin, Direct: 0 mg/dL (ref 0.0–0.3)
Total Bilirubin: 0.3 mg/dL (ref 0.2–1.2)
Total Protein: 6.9 g/dL (ref 6.0–8.3)

## 2018-08-19 ENCOUNTER — Encounter: Payer: Self-pay | Admitting: Internal Medicine

## 2018-08-19 ENCOUNTER — Ambulatory Visit (INDEPENDENT_AMBULATORY_CARE_PROVIDER_SITE_OTHER): Payer: Medicare HMO | Admitting: Internal Medicine

## 2018-08-19 VITALS — BP 148/74 | HR 59 | Temp 97.8°F | Resp 16 | Ht 65.0 in | Wt 153.6 lb

## 2018-08-19 DIAGNOSIS — Z0000 Encounter for general adult medical examination without abnormal findings: Secondary | ICD-10-CM

## 2018-08-19 DIAGNOSIS — M25561 Pain in right knee: Secondary | ICD-10-CM | POA: Diagnosis not present

## 2018-08-19 DIAGNOSIS — Z1231 Encounter for screening mammogram for malignant neoplasm of breast: Secondary | ICD-10-CM

## 2018-08-19 DIAGNOSIS — E7800 Pure hypercholesterolemia, unspecified: Secondary | ICD-10-CM

## 2018-08-19 DIAGNOSIS — Z1239 Encounter for other screening for malignant neoplasm of breast: Secondary | ICD-10-CM | POA: Diagnosis not present

## 2018-08-19 DIAGNOSIS — K5010 Crohn's disease of large intestine without complications: Secondary | ICD-10-CM | POA: Diagnosis not present

## 2018-08-19 DIAGNOSIS — E538 Deficiency of other specified B group vitamins: Secondary | ICD-10-CM

## 2018-08-19 DIAGNOSIS — I10 Essential (primary) hypertension: Secondary | ICD-10-CM

## 2018-08-19 MED ORDER — AMLODIPINE BESYLATE 10 MG PO TABS: 10.0000 mg | ORAL_TABLET | Freq: Every day | ORAL | 2 refills | Status: DC

## 2018-08-19 NOTE — Patient Instructions (Signed)
Increase amlodipine to 10mg per day 

## 2018-08-19 NOTE — Progress Notes (Signed)
Patient ID: Ellen Drake, female   DOB: 26-Apr-1946, 73 y.o.   MRN: [id number]   Subjective:    Patient ID: Ellen Drake, female    DOB: February 22, 1946, 73 y.o.   MRN: [id number]  HPI  Patient here for her physical exam.  She reports persistent increased knee pain and swelling.  Has seen multiple specialist.  Due to f/u with Dr Gerrit Heck.  Was evaluated in [place].  They are referring her to Dr Binnie Rail.  Reports not able to get a refill on her pain medication.  Needs earlier appt with Dr Gerrit Heck.  Other than her knee, she states she is doing well.  No chest pain.  No sob.  No acid reflux.  No abdominal pain.  Bowels moving.  No urine change.  Blood pressures averaging 132-138/70-80s.     Past Medical History:  Diagnosis Date  . Anemia    reslved  . Colon polyp 2009  . Crohn's disease ([place])   . Diverticulosis   . Fibrocystic breast disease   . Herniated nucleus pulposus of lumbosacral region   . History of chicken pox   . Hypercholesterolemia   . Hyperlipidemia   . Hypertension   . Osteoarthritis   . Pulmonary nodule seen on imaging study 2004   resolved 2007   Past Surgical History:  Procedure Laterality Date  . anterior cervical discectomy and fusion  1998   Dr Rolin Barry  . BACK SURGERY  1992   ruptured disc (Dr Mauri Pole)  . BREAST BIOPSY Left    neg-no scar seen  . COLECTOMY  2003  . COLON SURGERY  1998  . COLONOSCOPY  2009  . COLONOSCOPY W/ BIOPSIES  11/30/2012   Procedure: COLONOSCOPY W/BIOPSY; Surgeon: Colvin Caroli, MD; Location: [place]; Service: Gastroenterology;;  . COLONOSCOPY W/ BIOPSIES  04/15/2016   Procedure: Colonoscopy ; Surgeon: Colvin Caroli, MD; Location: [place]; Service: Gastroenterology; Laterality: N/A;  . KNEE ARTHROSCOPY W/ SYNOVECTOMY Right 03/10/2014   limited  . KNEE SURGERY Right 1/16   [facility] (Dr. Sherlynn Carbon)  . POSTERIOR LAMINECTOMY / DECOMPRESSION LUMBAR SPINE  1999  . REPLACEMENT TOTAL KNEE    .  ROTATOR CUFF REPAIR Left 2007   acute open; accident   Family History  Problem Relation Age of Onset  . CVA Mother   . Hypertension Mother   . Hypertension Sister        brain tumor  . Breast cancer Neg Hx   . Colon cancer Neg Hx    Social History   Socioeconomic History  . Marital status: Married    Spouse name: Not on file  . Number of children: 0  . Years of education: 66  . Highest education level: Not on file  Occupational History  . Not on file  Social Needs  . Financial resource strain: Not on file  . Food insecurity:    Worry: Not on file    Inability: Not on file  . Transportation needs:    Medical: Not on file    Non-medical: Not on file  Tobacco Use  . Smoking status: Former Smoker    Last attempt to quit: 09/10/2009    Years since quitting: 8.9  . Smokeless tobacco: Never Used  Substance and Sexual Activity  . Alcohol use: Yes    Alcohol/week: 1.0 standard drinks    Types: 1 Glasses of wine per week    Comment: wine occasional; socially  . Drug use: No  .  Sexual activity: Yes  Lifestyle  . Physical activity:    Days per week: Not on file    Minutes per session: Not on file  . Stress: Not on file  Relationships  . Social connections:    Talks on phone: Not on file    Gets together: Not on file    Attends religious service: Not on file    Active member of club or organization: Not on file    Attends meetings of clubs or organizations: Not on file    Relationship status: Not on file  Other Topics Concern  . Not on file  Social History Narrative  . Not on file    Outpatient Encounter Medications as of 08/19/2018  Medication Sig  . amLODipine (NORVASC) 10 MG tablet Take 1 tablet (10 mg total) by mouth daily.  Marland Kitchen atenolol (TENORMIN) 50 MG tablet TAKE 1 TABLET BY MOUTH EVERY DAY  . brimonidine (ALPHAGAN) 0.2 % ophthalmic solution 1 drop 2 (two) times daily.   Marland Kitchen HYDROmorphone (DILAUDID) 2 MG tablet Take 1-2 tablets (2-4 mg total) by mouth every 3  (three) hours as needed. 1-2 tablets (59m - 4 mg)  . latanoprost (XALATAN) 0.005 % ophthalmic solution Place 1 drop into both eyes at bedtime.   .Marland Kitchenlisinopril (PRINIVIL,ZESTRIL) 40 MG tablet TAKE 1 TABLET BY MOUTH EVERY DAY  . rosuvastatin (CRESTOR) 10 MG tablet TAKE 1 TABLET BY MOUTH EVERY DAY  . sulfaSALAzine (AZULFIDINE) 500 MG tablet Take 1,500 mg by mouth 2 (two) times daily. 3 tabs  . timolol (BETIMOL) 0.5 % ophthalmic solution 1 drop 2 (two) times daily.  . timolol (TIMOPTIC) 0.5 % ophthalmic solution Place 1 drop into both eyes 2 (two) times daily.  . [DISCONTINUED] amLODipine (NORVASC) 5 MG tablet TAKE 1 TABLET BY MOUTH EVERY DAY  . [DISCONTINUED] atenolol (TENORMIN) 50 MG tablet TAKE 1 TABLET BY MOUTH EVERY DAY   Facility-Administered Encounter Medications as of 08/19/2018  Medication  . cyanocobalamin ((VITAMIN B-12)) injection 1,000 mcg    Review of Systems  Constitutional: Negative for appetite change and unexpected weight change.  HENT: Negative for congestion and sinus pressure.   Eyes: Negative for pain and visual disturbance.  Respiratory: Negative for cough, chest tightness and shortness of breath.   Cardiovascular: Negative for chest pain and palpitations.  Gastrointestinal: Negative for abdominal pain, diarrhea, nausea and vomiting.  Genitourinary: Negative for difficulty urinating and dysuria.  Musculoskeletal: Negative for myalgias.       Knee pain and swelling.    Skin: Negative for color change and rash.  Neurological: Negative for dizziness, light-headedness and headaches.  Hematological: Negative for adenopathy. Does not bruise/bleed easily.  Psychiatric/Behavioral: Negative for agitation and dysphoric mood.       Objective:    Physical Exam Constitutional:      General: She is not in acute distress.    Appearance: Normal appearance.  HENT:     Nose: Nose normal. No congestion.     Mouth/Throat:     Pharynx: No oropharyngeal exudate or posterior  oropharyngeal erythema.  Neck:     Musculoskeletal: Neck supple. No muscular tenderness.     Thyroid: No thyromegaly.  Cardiovascular:     Rate and Rhythm: Normal rate and regular rhythm.  Pulmonary:     Effort: No respiratory distress.     Breath sounds: Normal breath sounds. No wheezing.  Abdominal:     General: Bowel sounds are normal.     Palpations: Abdomen is soft.  Tenderness: There is no abdominal tenderness.  Musculoskeletal:     Comments: Persistent knee pain and swelling.    Lymphadenopathy:     Cervical: No cervical adenopathy.  Skin:    Findings: No erythema or rash.  Neurological:     Mental Status: She is alert.  Psychiatric:        Mood and Affect: Mood normal.        Behavior: Behavior normal.     BP (!) 148/74 (BP Location: Left Arm, Patient Position: Sitting, Cuff Size: Normal)   Pulse (!) 59   Temp 97.8 F (36.6 C) (Oral)   Resp 16   Ht 5' 5"  (1.651 m)   Wt 153 lb 9.6 oz (69.7 kg)   LMP 08/13/1995   SpO2 98%   BMI 25.56 kg/m  Wt Readings from Last 3 Encounters:  08/19/18 153 lb 9.6 oz (69.7 kg)  04/29/18 152 lb 3.2 oz (69 kg)  12/17/17 151 lb 9.6 oz (68.8 kg)     Lab Results  Component Value Date   WBC 8.5 12/15/2017   HGB 12.0 12/15/2017   HCT 36.2 12/15/2017   PLT 199.0 12/15/2017   GLUCOSE 102 (H) 08/16/2018   CHOL 157 08/16/2018   TRIG 103.0 08/16/2018   HDL 69.00 08/16/2018   LDLDIRECT 132.5 05/06/2013   LDLCALC 68 08/16/2018   ALT 28 08/16/2018   AST 32 08/16/2018   NA 143 08/16/2018   K 4.1 08/16/2018   CL 109 08/16/2018   CREATININE 0.77 08/16/2018   BUN 13 08/16/2018   CO2 29 08/16/2018   TSH 2.11 12/15/2017   HGBA1C 5.7 10/19/2012    Mm Screening Breast Tomo Bilateral  Result Date: 08/27/2017 CLINICAL DATA:  Screening. EXAM: 2D DIGITAL SCREENING BILATERAL MAMMOGRAM WITH 3D TOMO WITH CAD COMPARISON:  Previous exam(s). ACR Breast Density Category b: There are scattered areas of fibroglandular density. FINDINGS:  There are no findings suspicious for malignancy. Images were processed with CAD. IMPRESSION: No mammographic evidence of malignancy. A result letter of this screening mammogram will be mailed directly to the patient. RECOMMENDATION: Screening mammogram in one year. (Code:SM-B-01Y) BI-RADS CATEGORY  1: Negative. Electronically Signed   By: Curlene Dolphin M.D.   On: 08/27/2017 13:21       Assessment & Plan:   Problem List Items Addressed This Visit    B12 deficiency    Continue B12 injections.        Crohn's disease ([place])    Followed by Dr Evalee Mutton.  Stable.  Continue sulfasalazine.        Health care maintenance    Physical today 08/19/18.  Colonoscopy 04/2016.  Mammogram 08/27/17 - Birads I.        Hypercholesterolemia    On crestor.  Low cholesterol diet and exercise.  Follow lipid panel and liver function tests.        Relevant Medications   amLODipine (NORVASC) 10 MG tablet   Hypertension    Blood pressure elevated.  Will increase amlodipine to 82m q day.    Follow pressures.  Follow metabolic panel.        Relevant Medications   amLODipine (NORVASC) 10 MG tablet   Knee pain    Persistent knee pain and swelling.  Has seen multiple specialists.  Planning to f/u with Dr LGerrit Heck  Needs earlier appt and will need to get pain meds through his office as well.         Other Visit Diagnoses    Visit for  screening mammogram    -  Primary   Breast cancer screening       Relevant Orders   MM 3D SCREEN BREAST BILATERAL       Einar Pheasant, MD

## 2018-08-22 ENCOUNTER — Encounter: Payer: Self-pay | Admitting: Internal Medicine

## 2018-08-22 NOTE — Assessment & Plan Note (Signed)
Followed by Dr Evalee Mutton.  Stable.  Continue sulfasalazine.

## 2018-08-22 NOTE — Assessment & Plan Note (Signed)
Persistent knee pain and swelling.  Has seen multiple specialists.  Planning to f/u with Dr Gerrit Heck.  Needs earlier appt and will need to get pain meds through his office as well.

## 2018-08-22 NOTE — Assessment & Plan Note (Signed)
Continue B12 injections.

## 2018-08-22 NOTE — Assessment & Plan Note (Addendum)
Blood pressure elevated.  Will increase amlodipine to 51m q day.    Follow pressures.  Follow metabolic panel.

## 2018-08-22 NOTE — Assessment & Plan Note (Signed)
On crestor.  Low cholesterol diet and exercise.  Follow lipid panel and liver function tests.

## 2018-08-22 NOTE — Assessment & Plan Note (Signed)
Physical today 08/19/18.  Colonoscopy 04/2016.  Mammogram 08/27/17 - Birads I.

## 2018-08-30 DIAGNOSIS — M25561 Pain in right knee: Secondary | ICD-10-CM | POA: Diagnosis not present

## 2018-09-02 ENCOUNTER — Ambulatory Visit
Admission: RE | Admit: 2018-09-02 | Discharge: 2018-09-02 | Disposition: A | Discharge status: Home / Self Care | Payer: Medicare HMO | Source: Ambulatory Visit | Attending: Internal Medicine | Admitting: Internal Medicine

## 2018-09-02 DIAGNOSIS — Z1231 Encounter for screening mammogram for malignant neoplasm of breast: Secondary | ICD-10-CM | POA: Diagnosis not present

## 2018-09-02 DIAGNOSIS — Z1239 Encounter for other screening for malignant neoplasm of breast: Secondary | ICD-10-CM

## 2018-09-07 ENCOUNTER — Other Ambulatory Visit: Payer: Self-pay | Admitting: Internal Medicine

## 2018-09-08 DIAGNOSIS — Z79899 Other long term (current) drug therapy: Secondary | ICD-10-CM | POA: Diagnosis not present

## 2018-09-08 DIAGNOSIS — Z79891 Long term (current) use of opiate analgesic: Secondary | ICD-10-CM | POA: Diagnosis not present

## 2018-09-08 DIAGNOSIS — M545 Low back pain: Secondary | ICD-10-CM | POA: Diagnosis not present

## 2018-09-08 DIAGNOSIS — Z5181 Encounter for therapeutic drug level monitoring: Secondary | ICD-10-CM | POA: Diagnosis not present

## 2018-10-06 DIAGNOSIS — M545 Low back pain: Secondary | ICD-10-CM | POA: Diagnosis not present

## 2018-10-06 DIAGNOSIS — Z79891 Long term (current) use of opiate analgesic: Secondary | ICD-10-CM | POA: Diagnosis not present

## 2018-10-06 DIAGNOSIS — Z79899 Other long term (current) drug therapy: Secondary | ICD-10-CM | POA: Diagnosis not present

## 2018-10-19 DIAGNOSIS — L932 Other local lupus erythematosus: Secondary | ICD-10-CM | POA: Diagnosis not present

## 2018-10-20 DIAGNOSIS — Z96651 Presence of right artificial knee joint: Secondary | ICD-10-CM | POA: Diagnosis not present

## 2018-10-20 DIAGNOSIS — M89061 Algoneurodystrophy, right lower leg: Secondary | ICD-10-CM | POA: Diagnosis not present

## 2018-10-20 DIAGNOSIS — M25561 Pain in right knee: Secondary | ICD-10-CM | POA: Diagnosis not present

## 2018-10-21 DIAGNOSIS — G894 Chronic pain syndrome: Secondary | ICD-10-CM | POA: Diagnosis not present

## 2018-10-29 ENCOUNTER — Ambulatory Visit (INDEPENDENT_AMBULATORY_CARE_PROVIDER_SITE_OTHER): Payer: Medicare HMO | Admitting: Internal Medicine

## 2018-10-29 ENCOUNTER — Other Ambulatory Visit: Payer: Self-pay

## 2018-10-29 ENCOUNTER — Encounter: Payer: Self-pay | Admitting: Internal Medicine

## 2018-10-29 DIAGNOSIS — E538 Deficiency of other specified B group vitamins: Secondary | ICD-10-CM | POA: Diagnosis not present

## 2018-10-29 DIAGNOSIS — E7800 Pure hypercholesterolemia, unspecified: Secondary | ICD-10-CM | POA: Diagnosis not present

## 2018-10-29 DIAGNOSIS — M25561 Pain in right knee: Secondary | ICD-10-CM

## 2018-10-29 DIAGNOSIS — K5010 Crohn's disease of large intestine without complications: Secondary | ICD-10-CM | POA: Diagnosis not present

## 2018-10-29 DIAGNOSIS — I10 Essential (primary) hypertension: Secondary | ICD-10-CM | POA: Diagnosis not present

## 2018-10-29 NOTE — Progress Notes (Signed)
Patient ID: Arnoldo Hooker Day Badman, female   DOB: Mar 24, 1946, 73 y.o.   MRN: [id number]   Subjective:    Patient ID: Arnoldo Hooker Day [place], female    DOB: 04-24-1946, 73 y.o.   MRN: [id number]  HPI  Patient here for a scheduled follow up.  She reports she is doing better.  Increased amlodipine to 64m q day last visit.  Blood pressure doing better.  She feels better.  No chest pain.  No sob.  No acid reflux.  No abdominal pain.  Bowels doing well.  No recent flares with her bowels.  [facility]  Started on cymbalta.  Feels this is helping.  Overall feels better.  Trying to stay active.     Past Medical History:  Diagnosis Date  . Anemia    reslved  . Colon polyp 2009  . [place]'s [place] ([place]   . Diverticulosis   . Fibrocystic breast disease   . Herniated nucleus pulposus of lumbosacral region   . History of chicken pox   . Hypercholesterolemia   . Hyperlipidemia   . Hypertension   . Osteoarthritis   . Pulmonary nodule seen on imaging study 2004   resolved 2007   Past Surgical History:  Procedure Laterality Date  . anterior cervical discectomy and fusion  1998   Dr DRolin Barry . BACK SURGERY  1992   ruptured disc (Dr CMauri Pole  . BREAST BIOPSY Left    neg-no scar seen  . COLECTOMY  2003  . COLON SURGERY  1998  . COLONOSCOPY  2009  . COLONOSCOPY W/ BIOPSIES  11/30/2012   Procedure: COLONOSCOPY W/BIOPSY; Surgeon: JColvin Caroli MD; [place] Service: Gastroenterology;;  . COLONOSCOPY W/ BIOPSIES  04/15/2016   Procedure: Colonoscopy ; Surgeon: JColvin Caroli MD; [place] Service: Gastroenterology; Laterality: N/A;  . KNEE ARTHROSCOPY W/ SYNOVECTOMY Right 03/10/2014   limited  . [facility]  . POSTERIOR LAMINECTOMY / DECOMPRESSION LUMBAR SPINE  1999  . REPLACEMENT TOTAL KNEE    . ROTATOR CUFF REPAIR Left 2007   acute open; accident   Family History  Problem Relation Age of Onset   . CVA Mother   . Hypertension Mother   . Hypertension Sister        brain tumor  . Breast cancer Neg Hx   . Colon cancer Neg Hx    Social History   Socioeconomic History  . Marital status: Married    Spouse name: Not on file  . Number of children: 0  . Years of education: 142 . Highest education level: Not on file  Occupational History  . Not on file  Social Needs  . Financial resource strain: Not on file  . Food insecurity:    Worry: Not on file    Inability: Not on file  . Transportation needs:    Medical: Not on file    Non-medical: Not on file  Tobacco Use  . Smoking status: Former Smoker    Last attempt to quit: 09/10/2009    Years since quitting: 9.1  . Smokeless tobacco: Never Used  Substance and Sexual Activity  . Alcohol use: Yes    Alcohol/week: 1.0 standard drinks    Types: 1 Glasses of wine per week    Comment: wine occasional; socially  . Drug use: No  . Sexual activity: Yes  Lifestyle  . Physical activity:    Days per week: Not  on file    Minutes per session: Not on file  . Stress: Not on file  Relationships  . Social connections:    Talks on phone: Not on file    Gets together: Not on file    Attends religious service: Not on file    Active member of club or organization: Not on file    Attends meetings of clubs or organizations: Not on file    Relationship status: Not on file  Other Topics Concern  . Not on file  Social History Narrative  . Not on file    Outpatient Encounter Medications as of 10/29/2018  Medication Sig  . amLODipine (NORVASC) 10 MG tablet Take 1 tablet (10 mg total) by mouth daily.  Marland Kitchen atenolol (TENORMIN) 50 MG tablet TAKE 1 TABLET BY MOUTH EVERY DAY  . brimonidine (ALPHAGAN) 0.2 % ophthalmic solution 1 drop 2 (two) times daily.   . DULoxetine (CYMBALTA) 30 MG capsule TAKE 1 CAP BY MOUTH DAILY X 1 WEEK, THEN 2 CAPS DAILY  . HYDROmorphone (DILAUDID) 2 MG tablet Take 1-2 tablets (2-4 mg total) by mouth every 3 (three) hours  as needed. 1-2 tablets (57m - 4 mg)  . latanoprost (XALATAN) 0.005 % ophthalmic solution Place 1 drop into both eyes at bedtime.   .Marland Kitchenlisinopril (PRINIVIL,ZESTRIL) 40 MG tablet TAKE 1 TABLET BY MOUTH EVERY DAY  . methocarbamol (ROBAXIN) 500 MG tablet TAKE 1 TABLET BY MOUTH EVERYDAY AT BEDTIME  . rosuvastatin (CRESTOR) 10 MG tablet TAKE 1 TABLET BY MOUTH EVERY DAY  . sulfaSALAzine (AZULFIDINE) 500 MG tablet Take 1,500 mg by mouth 2 (two) times daily. 3 tabs  . timolol (BETIMOL) 0.5 % ophthalmic solution 1 drop 2 (two) times daily.  . timolol (TIMOPTIC) 0.5 % ophthalmic solution Place 1 drop into both eyes 2 (two) times daily.   Facility-Administered Encounter Medications as of 10/29/2018  Medication  . cyanocobalamin ((VITAMIN B-12)) injection 1,000 mcg    Review of Systems  Constitutional: Negative for appetite change and unexpected weight change.  HENT: Negative for congestion and sinus pressure.   Respiratory: Negative for cough, chest tightness and shortness of breath.   Cardiovascular: Negative for chest pain, palpitations and leg swelling.  Gastrointestinal: Negative for abdominal pain, diarrhea, nausea and vomiting.  Genitourinary: Negative for difficulty urinating and dysuria.  Musculoskeletal: Negative for myalgias.       Knee doing some better.    Skin: Negative for color change and rash.  Neurological: Negative for dizziness, light-headedness and headaches.  Psychiatric/Behavioral: Negative for agitation and dysphoric mood.       Objective:    Physical Exam Constitutional:      General: She is not in acute distress.    Appearance: Normal appearance.  HENT:     Nose: Nose normal. No congestion.     Mouth/Throat:     Pharynx: No oropharyngeal exudate or posterior oropharyngeal erythema.  Neck:     Musculoskeletal: Neck supple. No muscular tenderness.     Thyroid: No thyromegaly.  Cardiovascular:     Rate and Rhythm: Normal rate and regular rhythm.  Pulmonary:      Effort: No respiratory distress.     Breath sounds: Normal breath sounds. No wheezing.  Abdominal:     General: Bowel sounds are normal.     Palpations: Abdomen is soft.     Tenderness: There is no abdominal tenderness.  Musculoskeletal:        General: No tenderness.  Lymphadenopathy:     Cervical:  No cervical adenopathy.  Skin:    Findings: No erythema or rash.  Neurological:     Mental Status: She is alert.  Psychiatric:        Mood and Affect: Mood normal.        Behavior: Behavior normal.     BP 126/78   Pulse (!) 58   Temp 97.8 F (36.6 C) (Oral)   Resp 16   Wt 152 lb (68.9 kg)   LMP 08/13/1995   SpO2 97%   BMI 25.29 kg/m  Wt Readings from Last 3 Encounters:  10/29/18 152 lb (68.9 kg)  08/19/18 153 lb 9.6 oz (69.7 kg)  04/29/18 152 lb 3.2 oz (69 kg)     Lab Results  Component Value Date   WBC 8.5 12/15/2017   HGB 12.0 12/15/2017   HCT 36.2 12/15/2017   PLT 199.0 12/15/2017   GLUCOSE 102 (H) 08/16/2018   CHOL 157 08/16/2018   TRIG 103.0 08/16/2018   HDL 69.00 08/16/2018   LDLDIRECT 132.5 05/06/2013   LDLCALC 68 08/16/2018   ALT 28 08/16/2018   AST 32 08/16/2018   NA 143 08/16/2018   K 4.1 08/16/2018   CL 109 08/16/2018   CREATININE 0.77 08/16/2018   BUN 13 08/16/2018   CO2 29 08/16/2018   TSH 2.11 12/15/2017   HGBA1C 5.7 10/19/2012    Mm 3d Screen Breast Bilateral  Result Date: 09/02/2018 CLINICAL DATA:  Screening. EXAM: DIGITAL SCREENING BILATERAL MAMMOGRAM WITH TOMO AND CAD COMPARISON:  Previous exam(s). ACR Breast Density Category c: The breast tissue is heterogeneously dense, which may obscure small masses. FINDINGS: There are no findings suspicious for malignancy. Images were processed with CAD. IMPRESSION: No mammographic evidence of malignancy. A result letter of this screening mammogram will be mailed directly to the patient. RECOMMENDATION: Screening mammogram in one year. (Code:SM-B-01Y) BI-RADS CATEGORY  1: Negative. Electronically Signed    By: Kristopher Oppenheim M.D.   On: 09/02/2018 14:19       Assessment & Plan:   Problem List Items Addressed This Visit    B12 deficiency    Continue B12 injections.        Crohn's disease ([place])    Followed by Dr Evalee Mutton.  Doing well.  No recent flares.        Hypercholesterolemia    On crestor.  Low cholesterol diet and exercise.  Follow lipid panel and liver function tests.        Hypertension    Blood pressure doing better.  Feels better.  Continue current medications.  Follow.        Knee pain    Has had persistent pain and swelling.  Just started on cymbalta.  Pain has improved some.  Follow.            Einar Pheasant, MD

## 2018-10-31 ENCOUNTER — Encounter: Payer: Self-pay | Admitting: Internal Medicine

## 2018-10-31 NOTE — Assessment & Plan Note (Signed)
Continue B12 injections.

## 2018-10-31 NOTE — Assessment & Plan Note (Signed)
Has had persistent pain and swelling.  Just started on cymbalta.  Pain has improved some.  Follow.

## 2018-10-31 NOTE — Assessment & Plan Note (Signed)
Followed by Dr Evalee Mutton.  Doing well.  No recent flares.

## 2018-10-31 NOTE — Assessment & Plan Note (Signed)
Blood pressure doing better.  Feels better.  Continue current medications.  Follow.

## 2018-10-31 NOTE — Assessment & Plan Note (Signed)
On crestor.  Low cholesterol diet and exercise.  Follow lipid panel and liver function tests.

## 2018-11-05 DIAGNOSIS — M545 Low back pain: Secondary | ICD-10-CM | POA: Diagnosis not present

## 2018-11-05 DIAGNOSIS — Z79891 Long term (current) use of opiate analgesic: Secondary | ICD-10-CM | POA: Diagnosis not present

## 2018-11-05 DIAGNOSIS — Z79899 Other long term (current) drug therapy: Secondary | ICD-10-CM | POA: Diagnosis not present

## 2018-11-05 DIAGNOSIS — G5603 Carpal tunnel syndrome, bilateral upper limbs: Secondary | ICD-10-CM | POA: Diagnosis not present

## 2018-11-05 DIAGNOSIS — M25561 Pain in right knee: Secondary | ICD-10-CM | POA: Diagnosis not present

## 2018-11-20 ENCOUNTER — Other Ambulatory Visit: Payer: Self-pay | Admitting: Internal Medicine

## 2018-11-22 DIAGNOSIS — G894 Chronic pain syndrome: Secondary | ICD-10-CM | POA: Diagnosis not present

## 2018-11-22 DIAGNOSIS — Z79891 Long term (current) use of opiate analgesic: Secondary | ICD-10-CM | POA: Diagnosis not present

## 2018-11-22 DIAGNOSIS — M545 Low back pain: Secondary | ICD-10-CM | POA: Diagnosis not present

## 2018-11-29 ENCOUNTER — Other Ambulatory Visit: Payer: Self-pay | Admitting: Internal Medicine

## 2018-12-02 ENCOUNTER — Other Ambulatory Visit: Payer: Self-pay | Admitting: Internal Medicine

## 2018-12-25 IMAGING — MG MM DIGITAL SCREENING BILAT W/ TOMO W/ CAD
8 of 13 series · 8 of 29 positions shown · non-contrast
Comparison: Previous exam(s).

CLINICAL DATA: Screening.

EXAM:
2D DIGITAL SCREENING BILATERAL MAMMOGRAM WITH 3D TOMO WITH CAD

[R XCCL]
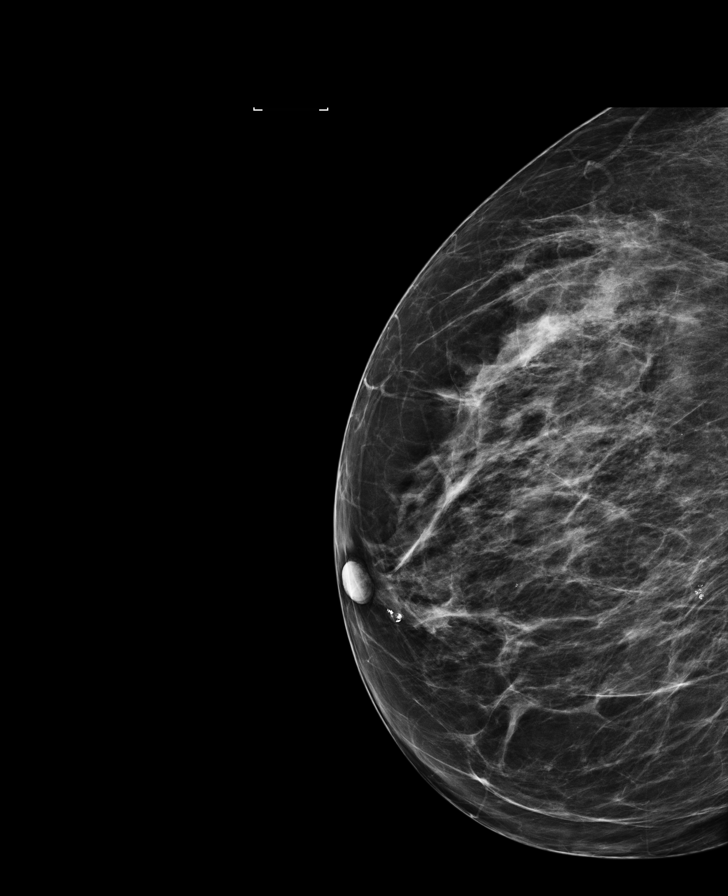

[L CC]
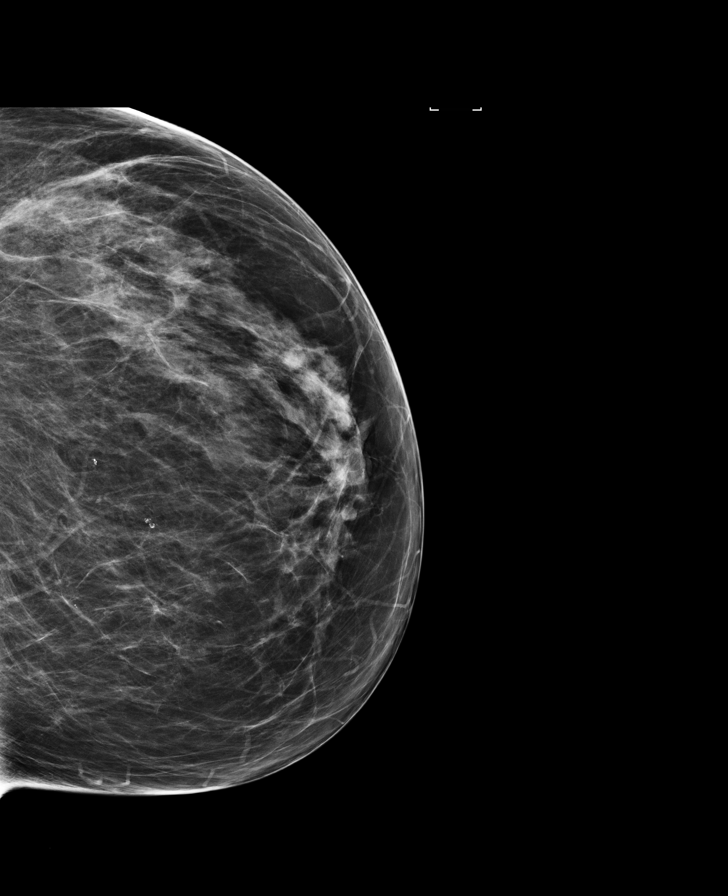

[R CC]
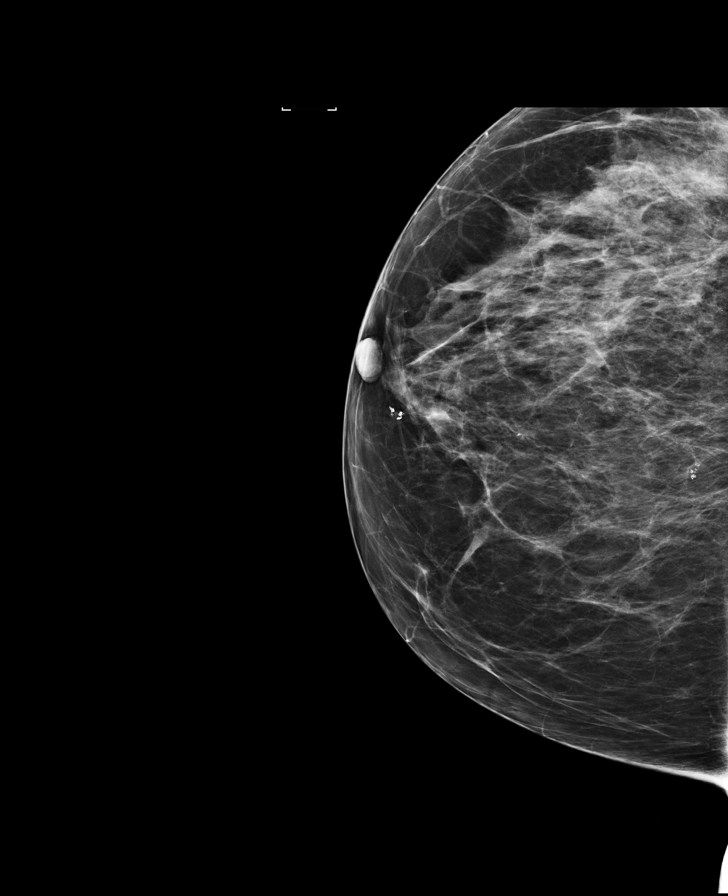

[R CC synth-2D]
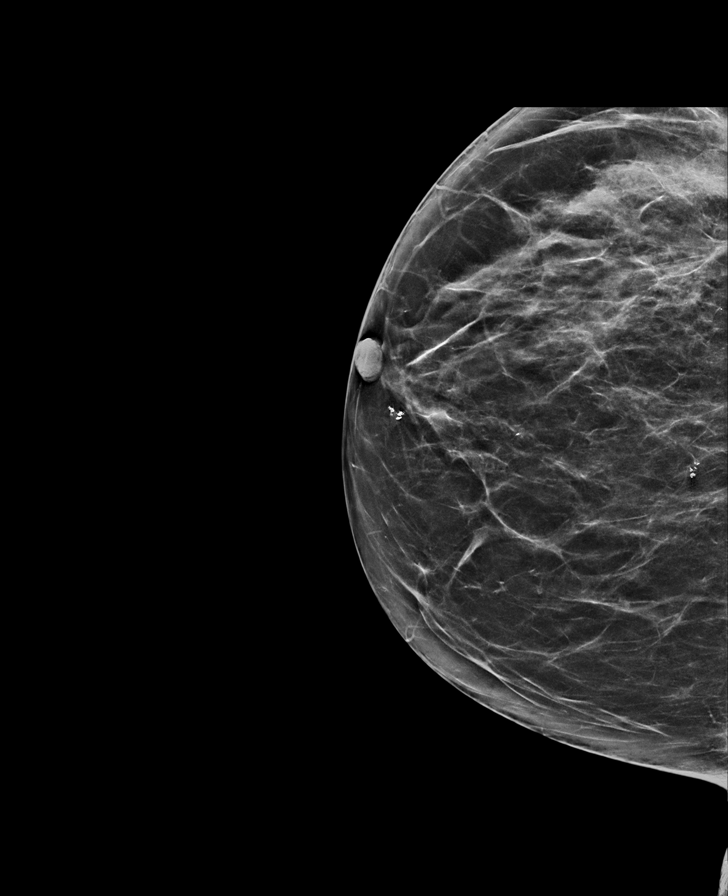

[L MLO]
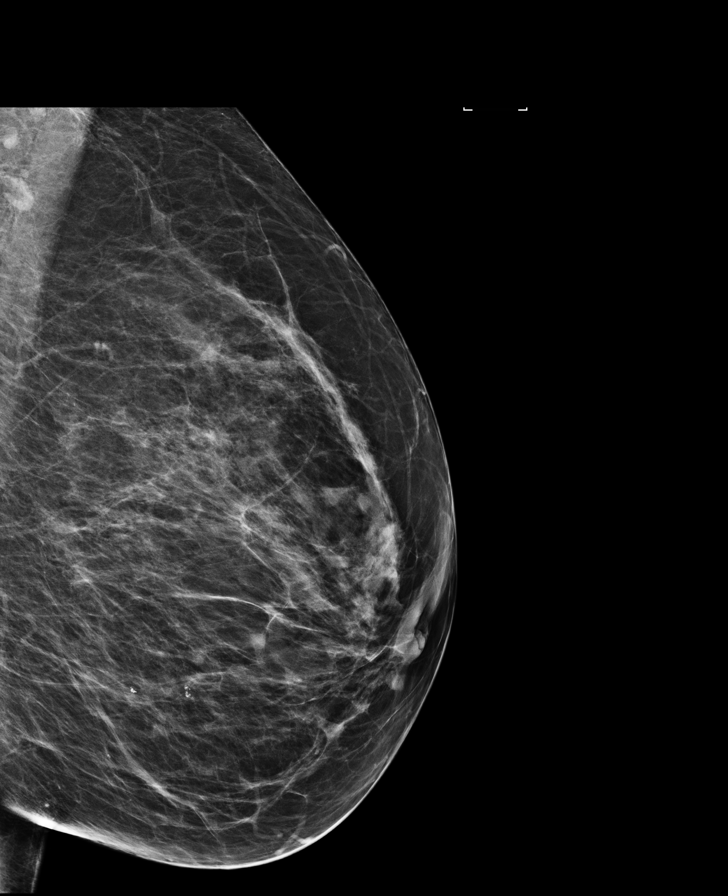

[L CC synth-2D]
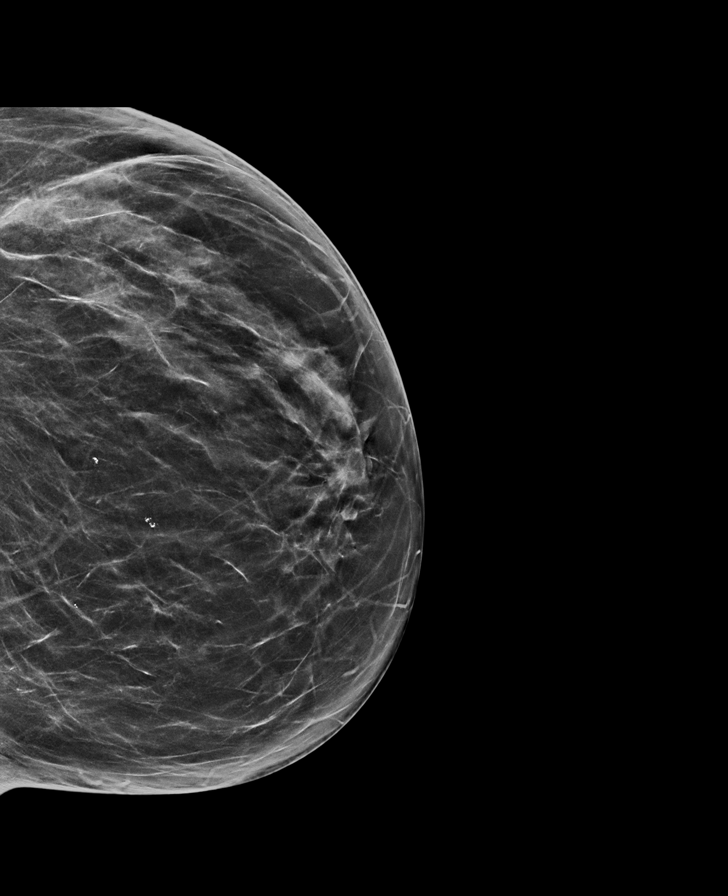

[R MLO synth-2D]
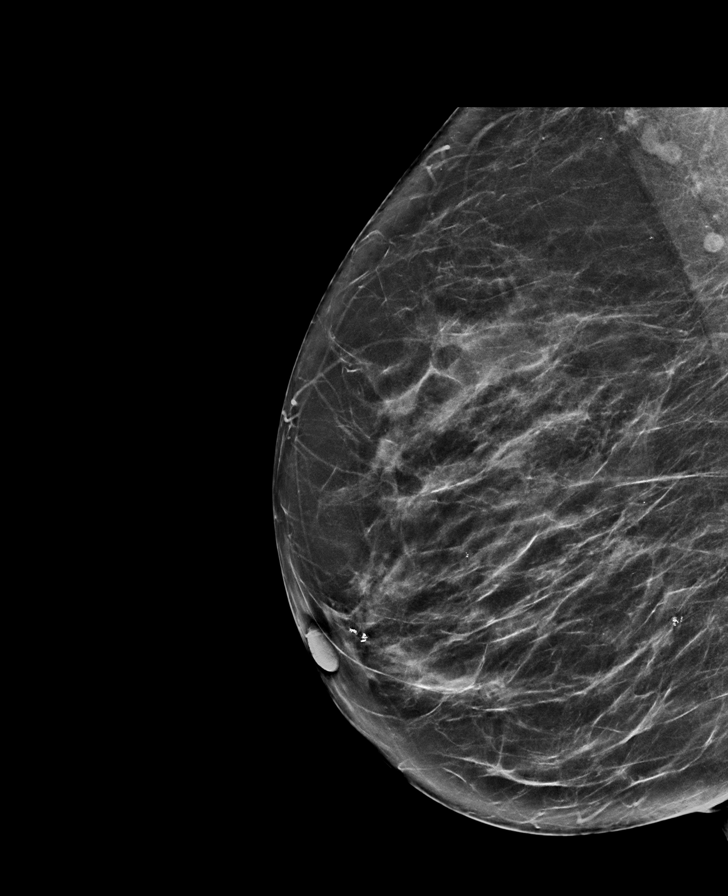

[R MLO]
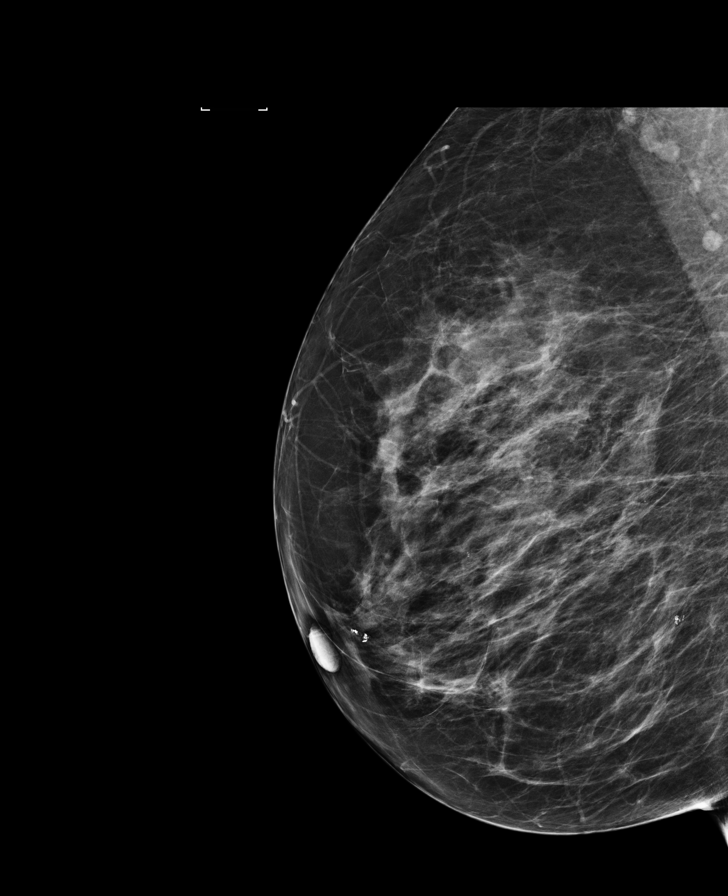

[8 of 29 positions shown; findings below may reference images not displayed]

ACR Breast Density Category b: There are scattered areas of
fibroglandular density.
FINDINGS: There are no findings suspicious for malignancy. Images were
processed with CAD.
IMPRESSION: No mammographic evidence of malignancy. A result letter of this
screening mammogram will be mailed directly to the patient.

RECOMMENDATION:
Screening mammogram in one year. (Code:GE-P-ZS0)

BI-RADS CATEGORY  1: Negative.

## 2018-12-30 DIAGNOSIS — H401123 Primary open-angle glaucoma, left eye, severe stage: Secondary | ICD-10-CM | POA: Diagnosis not present

## 2019-01-04 DIAGNOSIS — H401112 Primary open-angle glaucoma, right eye, moderate stage: Secondary | ICD-10-CM | POA: Diagnosis not present

## 2019-01-04 DIAGNOSIS — H2512 Age-related nuclear cataract, left eye: Secondary | ICD-10-CM | POA: Diagnosis not present

## 2019-01-10 DIAGNOSIS — G894 Chronic pain syndrome: Secondary | ICD-10-CM | POA: Diagnosis not present

## 2019-01-10 DIAGNOSIS — Z79899 Other long term (current) drug therapy: Secondary | ICD-10-CM | POA: Diagnosis not present

## 2019-01-10 DIAGNOSIS — Z79891 Long term (current) use of opiate analgesic: Secondary | ICD-10-CM | POA: Diagnosis not present

## 2019-01-10 DIAGNOSIS — M545 Low back pain: Secondary | ICD-10-CM | POA: Diagnosis not present

## 2019-01-10 DIAGNOSIS — Z5181 Encounter for therapeutic drug level monitoring: Secondary | ICD-10-CM | POA: Diagnosis not present

## 2019-01-10 DIAGNOSIS — G8929 Other chronic pain: Secondary | ICD-10-CM | POA: Diagnosis not present

## 2019-01-17 DIAGNOSIS — K5010 Crohn's disease of large intestine without complications: Secondary | ICD-10-CM | POA: Diagnosis not present

## 2019-01-26 DIAGNOSIS — H2589 Other age-related cataract: Secondary | ICD-10-CM | POA: Diagnosis not present

## 2019-01-26 DIAGNOSIS — I10 Essential (primary) hypertension: Secondary | ICD-10-CM | POA: Diagnosis not present

## 2019-01-27 DIAGNOSIS — M9913 Subluxation complex (vertebral) of lumbar region: Secondary | ICD-10-CM | POA: Diagnosis not present

## 2019-01-27 DIAGNOSIS — R69 Illness, unspecified: Secondary | ICD-10-CM | POA: Diagnosis not present

## 2019-01-27 DIAGNOSIS — Z743 Need for continuous supervision: Secondary | ICD-10-CM | POA: Diagnosis not present

## 2019-02-03 ENCOUNTER — Other Ambulatory Visit: Payer: Self-pay

## 2019-02-03 ENCOUNTER — Encounter: Payer: Self-pay | Admitting: *Deleted

## 2019-02-04 ENCOUNTER — Other Ambulatory Visit
Admission: RE | Admit: 2019-02-04 | Discharge: 2019-02-04 | Disposition: A | Discharge status: Home / Self Care | Payer: Medicare HMO | Source: Ambulatory Visit | Attending: Ophthalmology | Admitting: Ophthalmology

## 2019-02-04 DIAGNOSIS — Z1159 Encounter for screening for other viral diseases: Secondary | ICD-10-CM | POA: Diagnosis not present

## 2019-02-04 NOTE — Discharge Instructions (Signed)

## 2019-02-05 LAB — NOVEL CORONAVIRUS, NAA (HOSP ORDER, SEND-OUT TO REF LAB; TAT 18-24 HRS): SARS-CoV-2, NAA: NOT DETECTED

## 2019-02-09 ENCOUNTER — Ambulatory Visit: Payer: Medicare HMO | Admitting: Anesthesiology

## 2019-02-09 ENCOUNTER — Ambulatory Visit
Admission: RE | Admit: 2019-02-09 | Discharge: 2019-02-09 | Disposition: A | Discharge status: Home / Self Care | Payer: Medicare HMO | Attending: Ophthalmology | Admitting: Ophthalmology

## 2019-02-09 ENCOUNTER — Other Ambulatory Visit: Payer: Self-pay

## 2019-02-09 ENCOUNTER — Encounter
Admission: RE | Disposition: A | Discharge status: Home / Self Care | Payer: Self-pay | Source: Home / Self Care | Attending: Ophthalmology

## 2019-02-09 DIAGNOSIS — Z7982 Long term (current) use of aspirin: Secondary | ICD-10-CM | POA: Diagnosis not present

## 2019-02-09 DIAGNOSIS — Z9049 Acquired absence of other specified parts of digestive tract: Secondary | ICD-10-CM | POA: Insufficient documentation

## 2019-02-09 DIAGNOSIS — H2511 Age-related nuclear cataract, right eye: Secondary | ICD-10-CM | POA: Diagnosis present

## 2019-02-09 DIAGNOSIS — Z79899 Other long term (current) drug therapy: Secondary | ICD-10-CM | POA: Insufficient documentation

## 2019-02-09 DIAGNOSIS — H401112 Primary open-angle glaucoma, right eye, moderate stage: Secondary | ICD-10-CM | POA: Diagnosis not present

## 2019-02-09 DIAGNOSIS — Z96651 Presence of right artificial knee joint: Secondary | ICD-10-CM | POA: Diagnosis not present

## 2019-02-09 DIAGNOSIS — I10 Essential (primary) hypertension: Secondary | ICD-10-CM | POA: Insufficient documentation

## 2019-02-09 DIAGNOSIS — E7800 Pure hypercholesterolemia, unspecified: Secondary | ICD-10-CM | POA: Insufficient documentation

## 2019-02-09 DIAGNOSIS — H2589 Other age-related cataract: Secondary | ICD-10-CM | POA: Diagnosis not present

## 2019-02-09 DIAGNOSIS — H25811 Combined forms of age-related cataract, right eye: Secondary | ICD-10-CM | POA: Diagnosis not present

## 2019-02-09 DIAGNOSIS — Z87891 Personal history of nicotine dependence: Secondary | ICD-10-CM | POA: Diagnosis not present

## 2019-02-09 HISTORY — PX: CATARACT EXTRACTION W/PHACO: SHX586

## 2019-02-09 SURGERY — PHACOEMULSIFICATION, CATARACT, WITH IOL INSERTION
Anesthesia: Monitor Anesthesia Care | Site: Eye | Laterality: Right

## 2019-02-09 MED ORDER — LIDOCAINE HCL (PF) 2 % IJ SOLN
INTRAOCULAR | Status: DC | PRN
  Administered 2019-02-09: 1 mL

## 2019-02-09 MED ORDER — SODIUM HYALURONATE 23 MG/ML IO SOLN
INTRAOCULAR | Status: DC | PRN
  Administered 2019-02-09: 0.6 mL via INTRAOCULAR

## 2019-02-09 MED ORDER — MOXIFLOXACIN HCL 0.5 % OP SOLN
1.0000 [drp] | OPHTHALMIC | Status: DC | PRN
  Administered 2019-02-09 (×3): 1 [drp] via OPHTHALMIC

## 2019-02-09 MED ORDER — FENTANYL CITRATE (PF) 100 MCG/2ML IJ SOLN
INTRAMUSCULAR | Status: DC | PRN
  Administered 2019-02-09: 50 ug via INTRAVENOUS

## 2019-02-09 MED ORDER — ARMC OPHTHALMIC DILATING DROPS
1.0000 "application " | OPHTHALMIC | Status: DC | PRN
  Administered 2019-02-09 (×2): 1 via OPHTHALMIC

## 2019-02-09 MED ORDER — BRIMONIDINE TARTRATE-TIMOLOL 0.2-0.5 % OP SOLN
OPHTHALMIC | Status: DC | PRN
  Administered 2019-02-09: 1 [drp] via OPHTHALMIC

## 2019-02-09 MED ORDER — EPINEPHRINE PF 1 MG/ML IJ SOLN
INTRAOCULAR | Status: DC | PRN
  Administered 2019-02-09: 67 mL via OPHTHALMIC

## 2019-02-09 MED ORDER — LACTATED RINGERS IV SOLN: INTRAVENOUS | Status: DC

## 2019-02-09 MED ORDER — ONDANSETRON HCL 4 MG/2ML IJ SOLN: 4.0000 mg | Freq: Once | INTRAMUSCULAR | Status: DC | PRN

## 2019-02-09 MED ORDER — TRYPAN BLUE 0.06 % OP SOLN
OPHTHALMIC | Status: DC | PRN
  Administered 2019-02-09: 0.5 mL via INTRAOCULAR

## 2019-02-09 MED ORDER — MIDAZOLAM HCL 2 MG/2ML IJ SOLN
INTRAMUSCULAR | Status: DC | PRN
  Administered 2019-02-09: 2 mg via INTRAVENOUS

## 2019-02-09 MED ORDER — TETRACAINE HCL 0.5 % OP SOLN
1.0000 [drp] | OPHTHALMIC | Status: DC | PRN
  Administered 2019-02-09 (×3): 1 [drp] via OPHTHALMIC

## 2019-02-09 MED ORDER — NA HYALUR & NA CHOND-NA HYALUR 0.4-0.35 ML IO KIT
PACK | INTRAOCULAR | Status: DC | PRN
  Administered 2019-02-09: 1 mL via INTRAOCULAR

## 2019-02-09 MED ORDER — CEFUROXIME OPHTHALMIC INJECTION 1 MG/0.1 ML
INJECTION | OPHTHALMIC | Status: DC | PRN
  Administered 2019-02-09: 0.1 mL via INTRACAMERAL

## 2019-02-09 SURGICAL SUPPLY — 28 items
BLADE DUAL KAHOOK SINGLE USE (BLADE) ×1 IMPLANT
CANNULA ANT/CHMB 27G (MISCELLANEOUS) ×1 IMPLANT
CANNULA ANT/CHMB 27GA (MISCELLANEOUS) ×2 IMPLANT
GLOVE SURG LX 7.5 STRW (GLOVE) ×2
GLOVE SURG LX STRL 7.5 STRW (GLOVE) ×1 IMPLANT
GLOVE SURG TRIUMPH 8.0 PF LTX (GLOVE) ×2 IMPLANT
GOWN STRL REUS W/ TWL LRG LVL3 (GOWN DISPOSABLE) ×2 IMPLANT
GOWN STRL REUS W/TWL LRG LVL3 (GOWN DISPOSABLE) ×4
LENS IOL TECNIS ITEC 22.5 (Intraocular Lens) ×1 IMPLANT
MARKER SKIN DUAL TIP RULER LAB (MISCELLANEOUS) ×2 IMPLANT
NDL FILTER BLUNT 18X1 1/2 (NEEDLE) ×1 IMPLANT
NDL RETROBULBAR .5 NSTRL (NEEDLE) IMPLANT
NEEDLE FILTER BLUNT 18X 1/2SAF (NEEDLE) ×1
NEEDLE FILTER BLUNT 18X1 1/2 (NEEDLE) ×1 IMPLANT
PACK CATARACT BRASINGTON (MISCELLANEOUS) ×2 IMPLANT
PACK EYE AFTER SURG (MISCELLANEOUS) ×2 IMPLANT
PACK OPTHALMIC (MISCELLANEOUS) ×2 IMPLANT
RING MALYGIN (MISCELLANEOUS) ×1 IMPLANT
RING MALYGIN 7.0 (MISCELLANEOUS) IMPLANT
SUT ETHILON 10-0 CS-B-6CS-B-6 (SUTURE)
SUT VICRYL  9 0 (SUTURE)
SUT VICRYL 9 0 (SUTURE) IMPLANT
SUTURE EHLN 10-0 CS-B-6CS-B-6 (SUTURE) IMPLANT
SYR 3ML LL SCALE MARK (SYRINGE) ×2 IMPLANT
SYR 5ML LL (SYRINGE) ×2 IMPLANT
SYR TB 1ML LUER SLIP (SYRINGE) ×2 IMPLANT
WATER STERILE IRR 500ML POUR (IV SOLUTION) ×2 IMPLANT
WIPE NON LINTING 3.25X3.25 (MISCELLANEOUS) ×2 IMPLANT

## 2019-02-09 NOTE — Op Note (Signed)
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 [place] Dual Blade goniotomy right eye   ULTRASOUND TIME:15 % of 73mnutes 14seconds, CDE 11.1  Implant Name Type Inv. Item Serial No. Manufacturer Lot No. LRB No. Used Action  LENS IOL DIOP 22.5 - [id number] Lens LENS IOL DIOP 22.5 [id number]  Right 1 Implanted    SURGEON:  CWyonia Hough MD   ANESTHESIA:  Topical with tetracaine drops augmented with 1% preservative-free intracameral lidocaine.    COMPLICATIONS:  None.   DESCRIPTION OF PROCEDURE:  The patient was identified in the holding room and transported to the operating room and placed in the supine position under the operating microscope.  The right eye was identified as the operative eye and it was prepped and draped in the usual sterile ophthalmic fashion.   A 1 millimeter clear-corneal paracentesis was made at the 12:00 position.  0.5 ml of preservative-free 1% lidocaine was injected into the anterior chamber. Vision blue dye was used to stain the lend capsule for visualization during capsulorhexis.  The anterior chamber was filled with Healon5  viscoelastic.  A 2.4 millimeter keratome was used to make a near-clear corneal incision at the 9:00 position. The microscope was adjusted and a gonioprism was used to visulaize the trabecular meshwork.  The K[facility] LL[facility]ual Blade was advanced across the anterior chamber under viscoelastic.  The blade was used to mark the trabecular meshwork at the 1:30 position.  The blade was placed two clock hours clockwise into the  meshwork.  Proper postioning was confirmed.  The blade ws passed counterclockwise through the meshwork to excise approximately two to three clock-hours of trabecular meshwork.   A Malyugin ring was placed to enl;arge the pupil to 6.561m A curvilinear capsulorrhexis was made with a cystotome and capsulorrhexis forceps.  Balanced salt solution was used to hydrodissect and hydrodelineate the nucleus.   Phacoemulsification was then used in stop and chop fashion to remove the lens nucleus and epinucleus.  The remaining cortex was then removed using the irrigation and aspiration handpiece. Provisc was then placed into the capsular bag to distend it for lens placement.  A lens was then injected into the capsular bag.  The malyugin ring was explanted. The remaining viscoelastic was aspirated.   Wounds were hydrated with balanced salt solution.  The anterior chamber was inflated to a physiologic pressure with balanced salt solution.  No wound leaks were noted. Cefuroxime 0.1 ml of a 1062ml solution was injected into the anterior chamber for a dose of 1 mg of intracameral antibiotic at the completion of the case.  The patient was taken to the recovery room in stable condition without complications of anesthesia or surgery.

## 2019-02-09 NOTE — Transfer of Care (Signed)
Immediate Anesthesia Transfer of Care Note  Patient: Ellen Drake  Procedure(s) Performed: CATARACT EXTRACTION PHACO AND INTRAOCULAR LENS PLACEMENT (IOC) [place] DUAL BLADE GONIOTOMY  RIGHT HEALON 5, VISION BLUE (Right Eye)  Patient Location: PACU  Anesthesia Type: MAC  Level of Consciousness: awake, alert  and patient cooperative  Airway and Oxygen Therapy: Patient Spontanous Breathing and Patient connected to supplemental oxygen  Post-op Assessment: Post-op Vital signs reviewed, Patient's Cardiovascular Status Stable, Respiratory Function Stable, Patent Airway and No signs of Nausea or vomiting  Post-op Vital Signs: Reviewed and stable  Complications: No apparent anesthesia complications

## 2019-02-09 NOTE — Anesthesia Preprocedure Evaluation (Signed)
Anesthesia Evaluation  Patient identified by MRN, date of birth, ID band Patient awake    Reviewed: Allergy & Precautions, NPO status , Patient's Chart, lab work & pertinent test results  Airway Mallampati: II  TM Distance: >3 FB     Dental   Pulmonary former smoker,    breath sounds clear to auscultation       Cardiovascular hypertension,  Rhythm:Regular Rate:Normal  HLD   Neuro/Psych    GI/Hepatic Crohn's disease   Endo/Other  negative endocrine ROS  Renal/GU      Musculoskeletal  (+) Arthritis ,   Abdominal   Peds  Hematology  (+) anemia ,   Anesthesia Other Findings   Reproductive/Obstetrics                             Anesthesia Physical Anesthesia Plan  ASA: II  Anesthesia Plan: MAC   Post-op Pain Management:    Induction: Intravenous  PONV Risk Score and Plan:   Airway Management Planned: Nasal Cannula  Additional Equipment:   Intra-op Plan:   Post-operative Plan:   Informed Consent: I have reviewed the patients History and Physical, chart, labs and discussed the procedure including the risks, benefits and alternatives for the proposed anesthesia with the patient or authorized representative who has indicated his/her understanding and acceptance.       Plan Discussed with: CRNA  Anesthesia Plan Comments:         Anesthesia Quick Evaluation

## 2019-02-09 NOTE — H&P (Signed)

## 2019-02-09 NOTE — Anesthesia Procedure Notes (Signed)
Procedure Name: MAC Date/Time: 02/09/2019 2:13 PM Performed by: Cameron Ali, CRNA Pre-anesthesia Checklist: Patient identified, Emergency Drugs available, Suction available, Timeout performed and Patient being monitored Patient Re-evaluated:Patient Re-evaluated prior to induction Oxygen Delivery Method: Nasal cannula Placement Confirmation: positive ETCO2

## 2019-02-09 NOTE — Anesthesia Postprocedure Evaluation (Signed)
Anesthesia Post Note  Patient: Arnoldo Hooker Day Dileo  Procedure(s) Performed: CATARACT EXTRACTION PHACO AND INTRAOCULAR LENS PLACEMENT (IOC) KAHOOK DUAL BLADE GONIOTOMY  RIGHT HEALON 5, VISION BLUE (Right Eye)  Patient location during evaluation: PACU Anesthesia Type: MAC Level of consciousness: awake and alert Pain management: pain level controlled Vital Signs Assessment: post-procedure vital signs reviewed and stable Respiratory status: spontaneous breathing, nonlabored ventilation, respiratory function stable and patient connected to nasal cannula oxygen Cardiovascular status: stable and blood pressure returned to baseline Postop Assessment: no apparent nausea or vomiting Anesthetic complications: no    Veda Canning

## 2019-02-10 ENCOUNTER — Encounter: Payer: Self-pay | Admitting: Ophthalmology

## 2019-02-18 ENCOUNTER — Other Ambulatory Visit: Payer: Self-pay | Admitting: Internal Medicine

## 2019-03-02 DIAGNOSIS — M545 Low back pain: Secondary | ICD-10-CM | POA: Diagnosis not present

## 2019-03-02 DIAGNOSIS — G894 Chronic pain syndrome: Secondary | ICD-10-CM | POA: Diagnosis not present

## 2019-03-02 DIAGNOSIS — Z79899 Other long term (current) drug therapy: Secondary | ICD-10-CM | POA: Diagnosis not present

## 2019-03-02 DIAGNOSIS — M25561 Pain in right knee: Secondary | ICD-10-CM | POA: Diagnosis not present

## 2019-03-02 DIAGNOSIS — G8929 Other chronic pain: Secondary | ICD-10-CM | POA: Diagnosis not present

## 2019-03-02 DIAGNOSIS — Z79891 Long term (current) use of opiate analgesic: Secondary | ICD-10-CM | POA: Diagnosis not present

## 2019-03-15 ENCOUNTER — Other Ambulatory Visit: Payer: Self-pay

## 2019-03-17 ENCOUNTER — Other Ambulatory Visit: Payer: Self-pay

## 2019-03-17 ENCOUNTER — Ambulatory Visit (INDEPENDENT_AMBULATORY_CARE_PROVIDER_SITE_OTHER): Payer: Medicare HMO | Admitting: Internal Medicine

## 2019-03-17 ENCOUNTER — Encounter: Payer: Self-pay | Admitting: Internal Medicine

## 2019-03-17 VITALS — BP 118/70 | HR 61 | Temp 97.9°F | Resp 16 | Ht 65.0 in | Wt 152.4 lb

## 2019-03-17 DIAGNOSIS — I10 Essential (primary) hypertension: Secondary | ICD-10-CM | POA: Diagnosis not present

## 2019-03-17 DIAGNOSIS — K5010 Crohn's disease of large intestine without complications: Secondary | ICD-10-CM

## 2019-03-17 DIAGNOSIS — E538 Deficiency of other specified B group vitamins: Secondary | ICD-10-CM | POA: Diagnosis not present

## 2019-03-17 DIAGNOSIS — E7800 Pure hypercholesterolemia, unspecified: Secondary | ICD-10-CM | POA: Diagnosis not present

## 2019-03-17 DIAGNOSIS — M25561 Pain in right knee: Secondary | ICD-10-CM | POA: Diagnosis not present

## 2019-03-17 LAB — BASIC METABOLIC PANEL
BUN: 11 mg/dL (ref 6–23)
CO2: 31 mEq/L (ref 19–32)
Calcium: 9.7 mg/dL (ref 8.4–10.5)
Chloride: 108 mEq/L (ref 96–112)
Creatinine, Ser: 0.82 mg/dL (ref 0.40–1.20)
GFR: 82.61 mL/min (ref >60.00)
Glucose, Bld: 64 mg/dL — ABNORMAL LOW (ref 70–99)
Potassium: 4.1 mEq/L (ref 3.5–5.1)
Sodium: 145 mEq/L (ref 135–145)

## 2019-03-17 LAB — CBC WITH DIFFERENTIAL/PLATELET
Basophils Absolute: 0 10*3/uL (ref 0.0–0.1)
Basophils Relative: 0.4 % (ref 0.0–3.0)
Eosinophils Absolute: 0.2 10*3/uL (ref 0.0–0.7)
Eosinophils Relative: 2.2 % (ref 0.0–5.0)
HCT: 37.2 % (ref 36.0–46.0)
Hemoglobin: 12.2 g/dL (ref 12.0–15.0)
Lymphocytes Relative: 23.8 % (ref 12.0–46.0)
Lymphs Abs: 1.9 10*3/uL (ref 0.7–4.0)
MCHC: 32.9 g/dL (ref 30.0–36.0)
MCV: 99.3 fl (ref 78.0–100.0)
Monocytes Absolute: 0.8 10*3/uL (ref 0.1–1.0)
Monocytes Relative: 9.9 % (ref 3.0–12.0)
Neutro Abs: 5 10*3/uL (ref 1.4–7.7)
Neutrophils Relative %: 63.7 % (ref 43.0–77.0)
Platelets: 184 10*3/uL (ref 150.0–400.0)
RBC: 3.74 Mil/uL — ABNORMAL LOW (ref 3.87–5.11)
RDW: 13.5 % (ref 11.5–15.5)
WBC: 7.9 10*3/uL (ref 4.0–10.5)

## 2019-03-17 LAB — HEPATIC FUNCTION PANEL
ALT: 18 U/L (ref 0–35)
AST: 25 U/L (ref 0–37)
Albumin: 4.3 g/dL (ref 3.5–5.2)
Alkaline Phosphatase: 66 U/L (ref 39–117)
Bilirubin, Direct: 0.1 mg/dL (ref 0.0–0.3)
Total Bilirubin: 0.3 mg/dL (ref 0.2–1.2)
Total Protein: 6.6 g/dL (ref 6.0–8.3)

## 2019-03-17 LAB — LIPID PANEL
Cholesterol: 142 mg/dL (ref 0–200)
HDL: 66.5 mg/dL (ref >39.00)
LDL Cholesterol: 53 mg/dL (ref 0–99)
NonHDL: 75.8
Total CHOL/HDL Ratio: 2
Triglycerides: 116 mg/dL (ref 0.0–149.0)
VLDL: 23.2 mg/dL (ref 0.0–40.0)

## 2019-03-17 LAB — TSH: TSH: 1.57 u[IU]/mL (ref 0.35–4.50)

## 2019-03-17 NOTE — Progress Notes (Signed)
Patient ID: Ellen Drake, female   DOB: 1946/03/18, 73 y.o.   MRN: [id number]   Subjective:    Patient ID: Ellen Drake, female    DOB: [birth year], 73 y.o.   MRN: [id number]  HPI  Patient here for a scheduled follow up.  She reports she is doing relatively well. Knee is still bothering her.  Has had extensive w/up and has seen multiple specialists for evaluation.  Stable.  No better.  Still tries to stay active.  No chest pain.  No sob. No acid reflux.  No abdominal pain.  Bowels moving.  Blood pressure doing well.  S/p cataract surgery 02/09/19.  Doing well.  Tolerating crestor.      Past Medical History:  Diagnosis Date  . Anemia    reslved  . Colon polyp 2009  . Crohn's disease ([place])   . Diverticulosis   . Fibrocystic breast disease   . Herniated nucleus pulposus of lumbosacral region   . History of chicken pox   . Hypercholesterolemia   . Hyperlipidemia   . Hypertension   . Osteoarthritis   . Pulmonary nodule seen on imaging study 2004   resolved 2007   Past Surgical History:  Procedure Laterality Date  . anterior cervical discectomy and fusion  1998   Dr Rolin Barry  . BACK SURGERY  1992   ruptured disc (Dr Mauri Pole)  . BREAST BIOPSY Left    neg-no scar seen  . CATARACT EXTRACTION W/PHACO Right 02/09/2019   Procedure: CATARACT EXTRACTION PHACO AND INTRAOCULAR LENS PLACEMENT ([facility]) [place]  RIGHT HEALON 5, VISION BLUE;  Surgeon: Leandrew Koyanagi, MD;  Location: [facility];  Service: Ophthalmology;  Laterality: Right;  . COLECTOMY  2003  . COLON SURGERY  1998  . COLONOSCOPY  2009  . COLONOSCOPY W/ BIOPSIES  11/30/2012   Procedure: COLONOSCOPY W/BIOPSY; Surgeon: Colvin Caroli, MD; Location: [facility]; Service: Gastroenterology;;  . COLONOSCOPY W/ BIOPSIES  04/15/2016   Procedure: Colonoscopy ; Surgeon: Colvin Caroli, MD; Location: [facility]; Service: Gastroenterology; Laterality: N/A;  . KNEE ARTHROSCOPY W/  SYNOVECTOMY Right 03/10/2014   limited  . KNEE SURGERY Right 1/16   [facility] (Dr. Sherlynn Carbon)  . POSTERIOR LAMINECTOMY / DECOMPRESSION LUMBAR SPINE  1999  . REPLACEMENT TOTAL KNEE    . ROTATOR CUFF REPAIR Left 2007   acute open; accident   Family History  Problem Relation Age of Onset  . CVA Mother   . Hypertension Mother   . Hypertension Sister        brain tumor  . Breast cancer Neg Hx   . Colon cancer Neg Hx    Social History   Socioeconomic History  . Marital status: Married    Spouse name: Not on file  . Number of children: 0  . Years of education: 57  . Highest education level: Not on file  Occupational History  . Not on file  Social Needs  . Financial resource strain: Not on file  . Food insecurity    Worry: Not on file    Inability: Not on file  . Transportation needs    Medical: Not on file    Non-medical: Not on file  Tobacco Use  . Smoking status: Former Smoker    Quit date: 09/10/2009    Years since quitting: 9.5  . Smokeless tobacco: Never Used  Substance and Sexual Activity  . Alcohol use: Yes    Alcohol/week: 1.0 standard drinks  Types: 1 Glasses of wine per week    Comment: wine occasional; socially  . Drug use: No  . Sexual activity: Yes  Lifestyle  . Physical activity    Days per week: Not on file    Minutes per session: Not on file  . Stress: Not on file  Relationships  . Social Herbalist on phone: Not on file    Gets together: Not on file    Attends religious service: Not on file    Active member of club or organization: Not on file    Attends meetings of clubs or organizations: Not on file    Relationship status: Not on file  Other Topics Concern  . Not on file  Social History Narrative  . Not on file    Outpatient Encounter Medications as of 03/17/2019  Medication Sig  . amLODipine (NORVASC) 10 MG tablet TAKE 1 TABLET BY MOUTH EVERY DAY  . ASPIRIN 81 PO Take by mouth daily.  Marland Kitchen atenolol (TENORMIN) 50 MG tablet TAKE  1 TABLET BY MOUTH EVERY DAY  . brimonidine (ALPHAGAN) 0.2 % ophthalmic solution 1 drop 2 (two) times daily.   Marland Kitchen HYDROcodone-acetaminophen (NORCO/VICODIN) 5-325 MG tablet Take 1 tablet by mouth every 6 (six) hours as needed for moderate pain.  Marland Kitchen latanoprost (XALATAN) 0.005 % ophthalmic solution Place 1 drop into both eyes at bedtime.   Marland Kitchen lisinopril (ZESTRIL) 40 MG tablet TAKE 1 TABLET BY MOUTH EVERY DAY  . rosuvastatin (CRESTOR) 10 MG tablet TAKE 1 TABLET BY MOUTH EVERY DAY  . sulfaSALAzine (AZULFIDINE) 500 MG tablet Take 1,500 mg by mouth 2 (two) times daily. 3 tabs  . timolol (TIMOPTIC) 0.5 % ophthalmic solution Place 1 drop into both eyes 2 (two) times daily.  Marland Kitchen HYDROmorphone (DILAUDID) 2 MG tablet Take 1-2 tablets (2-4 mg total) by mouth every 3 (three) hours as needed. 1-2 tablets (22m - 4 mg) (Patient not taking: Reported on 02/03/2019)  . [DISCONTINUED] DULoxetine (CYMBALTA) 30 MG capsule TAKE 1 CAP BY MOUTH DAILY X 1 WEEK, THEN 2 CAPS DAILY  . [DISCONTINUED] methocarbamol (ROBAXIN) 500 MG tablet TAKE 1 TABLET BY MOUTH EVERYDAY AT BEDTIME   No facility-administered encounter medications on file as of 03/17/2019.     Review of Systems  Constitutional: Negative for appetite change and unexpected weight change.  HENT: Negative for congestion and sinus pressure.   Respiratory: Negative for cough, chest tightness and shortness of breath.   Cardiovascular: Negative for chest pain, palpitations and leg swelling.  Gastrointestinal: Negative for abdominal pain, diarrhea, nausea and vomiting.  Genitourinary: Negative for difficulty urinating and dysuria.  Musculoskeletal: Negative for myalgias.       Persistent knee pain and swelling.  Chronic.    Skin: Negative for color change and rash.  Neurological: Negative for dizziness, light-headedness and headaches.  Psychiatric/Behavioral: Negative for agitation and dysphoric mood.       Objective:    Physical Exam Constitutional:      General:  She is not in acute distress.    Appearance: Normal appearance.  HENT:     Right Ear: External ear normal.     Left Ear: External ear normal.  Eyes:     General:        Right eye: No discharge.     Conjunctiva/sclera: Conjunctivae normal.  Neck:     Musculoskeletal: Neck supple.     Thyroid: No thyromegaly.  Cardiovascular:     Rate and Rhythm: Normal rate and regular  rhythm.  Pulmonary:     Effort: No respiratory distress.     Breath sounds: Normal breath sounds. No wheezing.  Abdominal:     General: Bowel sounds are normal.     Palpations: Abdomen is soft.     Tenderness: There is no abdominal tenderness.  Musculoskeletal:        General: No swelling or tenderness.     Comments: Some chronic knee swelling.  No erythema.    Lymphadenopathy:     Cervical: No cervical adenopathy.  Skin:    Findings: No erythema or rash.  Neurological:     Mental Status: She is alert.  Psychiatric:        Mood and Affect: Mood normal.        Behavior: Behavior normal.     BP 118/70   Pulse 61   Temp 97.9 F (36.6 C) (Oral)   Resp 16   Ht 5' 5"  (1.651 m)   Wt 152 lb 6.4 oz (69.1 kg)   LMP 08/13/1995   SpO2 98%   BMI 25.36 kg/m  Wt Readings from Last 3 Encounters:  03/17/19 152 lb 6.4 oz (69.1 kg)  02/09/19 147 lb 14.9 oz (67.1 kg)  10/29/18 152 lb (68.9 kg)     Lab Results  Component Value Date   WBC 7.9 03/17/2019   HGB 12.2 03/17/2019   HCT 37.2 03/17/2019   PLT 184.0 03/17/2019   GLUCOSE 64 (L) 03/17/2019   CHOL 142 03/17/2019   TRIG 116.0 03/17/2019   HDL 66.50 03/17/2019   LDLDIRECT 132.5 05/06/2013   LDLCALC 53 03/17/2019   ALT 18 03/17/2019   AST 25 03/17/2019   NA 145 03/17/2019   K 4.1 03/17/2019   CL 108 03/17/2019   CREATININE 0.82 03/17/2019   BUN 11 03/17/2019   CO2 31 03/17/2019   TSH 1.57 03/17/2019   HGBA1C 5.7 10/19/2012       Assessment & Plan:   Problem List Items Addressed This Visit    B12 deficiency    Continue B12 injections.         Crohn's disease ([place]) - Primary    Followed by Dr Evalee Mutton.  Schedule to see her soon.  Bowels stable.        Hypercholesterolemia    On crestor.  Low cholesterol diet and exercise.  Follow lipid panel and liver function tests.        Relevant Orders   Hepatic function panel (Completed)   Lipid panel (Completed)   Hypertension    Blood pressure under good control.  Continue same medication regimen.  Follow pressures.  Follow metabolic panel.        Relevant Orders   CBC with Differential/Platelet (Completed)   TSH (Completed)   Basic metabolic panel (Completed)   Knee pain    Persistent knee pain and swelling.  Has seen multiple specialists and had extensive w/up.  Stable.            Einar Pheasant, MD

## 2019-03-20 ENCOUNTER — Encounter: Payer: Self-pay | Admitting: Internal Medicine

## 2019-03-20 NOTE — Assessment & Plan Note (Signed)
Blood pressure under good control.  Continue same medication regimen.  Follow pressures.  Follow metabolic panel.   

## 2019-03-20 NOTE — Assessment & Plan Note (Signed)
Continue B12 injections.

## 2019-03-20 NOTE — Assessment & Plan Note (Signed)
On crestor.  Low cholesterol diet and exercise.  Follow lipid panel and liver function tests.

## 2019-03-20 NOTE — Assessment & Plan Note (Signed)
Persistent knee pain and swelling.  Has seen multiple specialists and had extensive w/up.  Stable.

## 2019-03-20 NOTE — Assessment & Plan Note (Signed)
Followed by Dr Evalee Mutton.  Schedule to see her soon.  Bowels stable.

## 2019-03-30 DIAGNOSIS — Z79891 Long term (current) use of opiate analgesic: Secondary | ICD-10-CM | POA: Diagnosis not present

## 2019-03-30 DIAGNOSIS — M25561 Pain in right knee: Secondary | ICD-10-CM | POA: Diagnosis not present

## 2019-03-30 DIAGNOSIS — Z79899 Other long term (current) drug therapy: Secondary | ICD-10-CM | POA: Diagnosis not present

## 2019-03-30 DIAGNOSIS — M25569 Pain in unspecified knee: Secondary | ICD-10-CM | POA: Diagnosis not present

## 2019-03-30 DIAGNOSIS — G8929 Other chronic pain: Secondary | ICD-10-CM | POA: Diagnosis not present

## 2019-03-30 DIAGNOSIS — G894 Chronic pain syndrome: Secondary | ICD-10-CM | POA: Diagnosis not present

## 2019-03-30 DIAGNOSIS — M545 Low back pain: Secondary | ICD-10-CM | POA: Diagnosis not present

## 2019-04-01 NOTE — Telephone Encounter (Signed)
err

## 2019-04-13 DIAGNOSIS — Z96659 Presence of unspecified artificial knee joint: Secondary | ICD-10-CM | POA: Diagnosis not present

## 2019-04-23 DIAGNOSIS — Z01812 Encounter for preprocedural laboratory examination: Secondary | ICD-10-CM | POA: Diagnosis not present

## 2019-04-23 DIAGNOSIS — Z20828 Contact with and (suspected) exposure to other viral communicable diseases: Secondary | ICD-10-CM | POA: Diagnosis not present

## 2019-04-26 DIAGNOSIS — Z8719 Personal history of other diseases of the digestive system: Secondary | ICD-10-CM | POA: Diagnosis not present

## 2019-04-26 DIAGNOSIS — Z980 Intestinal bypass and anastomosis status: Secondary | ICD-10-CM | POA: Diagnosis not present

## 2019-04-26 DIAGNOSIS — Z1211 Encounter for screening for malignant neoplasm of colon: Secondary | ICD-10-CM | POA: Diagnosis not present

## 2019-04-26 DIAGNOSIS — M1711 Unilateral primary osteoarthritis, right knee: Secondary | ICD-10-CM | POA: Diagnosis not present

## 2019-04-26 DIAGNOSIS — D122 Benign neoplasm of ascending colon: Secondary | ICD-10-CM | POA: Diagnosis not present

## 2019-04-26 DIAGNOSIS — K644 Residual hemorrhoidal skin tags: Secondary | ICD-10-CM | POA: Diagnosis not present

## 2019-04-26 DIAGNOSIS — K648 Other hemorrhoids: Secondary | ICD-10-CM | POA: Diagnosis not present

## 2019-04-26 DIAGNOSIS — I10 Essential (primary) hypertension: Secondary | ICD-10-CM | POA: Diagnosis not present

## 2019-04-26 DIAGNOSIS — D120 Benign neoplasm of cecum: Secondary | ICD-10-CM | POA: Diagnosis not present

## 2019-04-26 DIAGNOSIS — E785 Hyperlipidemia, unspecified: Secondary | ICD-10-CM | POA: Diagnosis not present

## 2019-04-26 DIAGNOSIS — K50119 Crohn's disease of large intestine with unspecified complications: Secondary | ICD-10-CM | POA: Diagnosis not present

## 2019-04-26 DIAGNOSIS — Z86010 Personal history of colonic polyps: Secondary | ICD-10-CM | POA: Diagnosis not present

## 2019-04-26 DIAGNOSIS — D127 Benign neoplasm of rectosigmoid junction: Secondary | ICD-10-CM | POA: Diagnosis not present

## 2019-04-26 DIAGNOSIS — E538 Deficiency of other specified B group vitamins: Secondary | ICD-10-CM | POA: Diagnosis not present

## 2019-05-04 DIAGNOSIS — Z79891 Long term (current) use of opiate analgesic: Secondary | ICD-10-CM | POA: Diagnosis not present

## 2019-05-04 DIAGNOSIS — G8929 Other chronic pain: Secondary | ICD-10-CM | POA: Diagnosis not present

## 2019-05-04 DIAGNOSIS — M25561 Pain in right knee: Secondary | ICD-10-CM | POA: Diagnosis not present

## 2019-05-04 DIAGNOSIS — M545 Low back pain: Secondary | ICD-10-CM | POA: Diagnosis not present

## 2019-05-04 DIAGNOSIS — G894 Chronic pain syndrome: Secondary | ICD-10-CM | POA: Diagnosis not present

## 2019-05-04 DIAGNOSIS — Z79899 Other long term (current) drug therapy: Secondary | ICD-10-CM | POA: Diagnosis not present

## 2019-05-04 DIAGNOSIS — M25569 Pain in unspecified knee: Secondary | ICD-10-CM | POA: Diagnosis not present

## 2019-05-09 ENCOUNTER — Other Ambulatory Visit: Payer: Self-pay

## 2019-05-09 ENCOUNTER — Ambulatory Visit: Payer: Medicare HMO | Admitting: Internal Medicine

## 2019-05-10 ENCOUNTER — Telehealth: Payer: Self-pay

## 2019-05-10 ENCOUNTER — Ambulatory Visit: Payer: Medicare HMO | Admitting: Family Medicine

## 2019-05-10 ENCOUNTER — Other Ambulatory Visit (INDEPENDENT_AMBULATORY_CARE_PROVIDER_SITE_OTHER): Payer: Medicare HMO

## 2019-05-10 DIAGNOSIS — R300 Dysuria: Secondary | ICD-10-CM

## 2019-05-10 NOTE — Telephone Encounter (Signed)
Patient appt has already been cancelled.

## 2019-05-10 NOTE — Telephone Encounter (Signed)
Copied from [place] [id number]. Topic: Quick Communication - Appointment Cancellation >> May 10, 2019  7:24 AM Lennox Solders wrote: Patient called to cancel appointment scheduled for lauren. Patient HAS NOT rescheduled their appointment. Pt does not have power  Route to department's PEC pool.

## 2019-05-11 ENCOUNTER — Other Ambulatory Visit: Payer: Self-pay

## 2019-05-11 ENCOUNTER — Ambulatory Visit (INDEPENDENT_AMBULATORY_CARE_PROVIDER_SITE_OTHER): Payer: Medicare HMO | Admitting: Internal Medicine

## 2019-05-11 DIAGNOSIS — K5010 Crohn's disease of large intestine without complications: Secondary | ICD-10-CM

## 2019-05-11 DIAGNOSIS — N949 Unspecified condition associated with female genital organs and menstrual cycle: Secondary | ICD-10-CM | POA: Diagnosis not present

## 2019-05-11 DIAGNOSIS — I10 Essential (primary) hypertension: Secondary | ICD-10-CM

## 2019-05-11 LAB — URINALYSIS, ROUTINE W REFLEX MICROSCOPIC
Bilirubin Urine: NEGATIVE
Hgb urine dipstick: NEGATIVE
Ketones, ur: NEGATIVE
Leukocytes,Ua: NEGATIVE
Nitrite: NEGATIVE
RBC / HPF: NONE SEEN (ref 0–?)
Specific Gravity, Urine: >=1.03 — AB (ref 1.000–1.030)
Total Protein, Urine: NEGATIVE
Urine Glucose: NEGATIVE
Urobilinogen, UA: 0.2 (ref 0.0–1.0)
pH: 6 (ref 5.0–8.0)

## 2019-05-11 MED ORDER — NYSTATIN 100000 UNIT/GM EX CREA: 1.0000 "application " | TOPICAL_CREAM | Freq: Two times a day (BID) | CUTANEOUS | 0 refills | Status: DC

## 2019-05-11 NOTE — Progress Notes (Signed)
Patient ID: Ellen Drake, female   DOB: [birth year], 73 y.o.   MRN: [id number]   Virtual Visit via telephone Note  This visit type was conducted due to national recommendations for restrictions regarding the COVID-19 pandemic (e.g. social distancing).  This format is felt to be most appropriate for this patient at this time.  All issues noted in this document were discussed and addressed.  No physical exam was performed (except for noted visual exam findings with Video Visits).   I connected with [name] by telephone and verified that I am speaking with the correct person using two identifiers. Location patient: home Location provider: work Persons participating in the telephone visit: patient, provider  I discussed the limitations, risks, security and privacy concerns of performing an evaluation and management service by telephone and the availability of in person appointments.  The patient expressed understanding and agreed to proceed.   Reason for visit: work in appt  HPI: Reports had colonoscopy recently 04/26/19.  After colonoscopy, noticed burning.  On questioning, describes as burning in the outer vaginal area when the urine touches her skin.  She has been drinking and trying to stay hydrated.  Some decreased appetite.  No vaginal discharge.  Today has noticed some increased urinary frequency.  No urgency.  No hematuria.  Blood pressure doing well.  No fever.  States otherwise doing well.     ROS: See pertinent positives and negatives per HPI.  Past Medical History:  Diagnosis Date  . Anemia    reslved  . Colon polyp 2009  . Crohn's disease ([place])   . Diverticulosis   . Fibrocystic breast disease   . Herniated nucleus pulposus of lumbosacral region   . History of chicken pox   . Hypercholesterolemia   . Hyperlipidemia   . Hypertension   . Osteoarthritis   . Pulmonary nodule seen on imaging study 2004   resolved 2007    Past Surgical History:  Procedure  Laterality Date  . anterior cervical discectomy and fusion  1998   Dr Rolin Barry  . BACK SURGERY  1992   ruptured disc (Dr Mauri Pole)  . BREAST BIOPSY Left    neg-no scar seen  . CATARACT EXTRACTION W/PHACO Right 02/09/2019   Procedure: CATARACT EXTRACTION PHACO AND INTRAOCULAR LENS PLACEMENT ([facility]) [place]  RIGHT HEALON 5, VISION BLUE;  Surgeon: Leandrew Koyanagi, MD;  Location: [facility];  Service: Ophthalmology;  Laterality: Right;  . COLECTOMY  2003  . COLON SURGERY  1998  . COLONOSCOPY  2009  . COLONOSCOPY W/ BIOPSIES  11/30/2012   Procedure: COLONOSCOPY W/BIOPSY; Surgeon: Colvin Caroli, MD; Location: [facility]; Service: Gastroenterology;;  . COLONOSCOPY W/ BIOPSIES  04/15/2016   Procedure: Colonoscopy ; Surgeon: Colvin Caroli, MD; Location: [facility]; Service: Gastroenterology; Laterality: N/A;  . KNEE ARTHROSCOPY W/ SYNOVECTOMY Right 03/10/2014   limited  . KNEE SURGERY Right 1/16   [facility] Ortho (Dr. Sherlynn Carbon)  . POSTERIOR LAMINECTOMY / DECOMPRESSION LUMBAR SPINE  1999  . REPLACEMENT TOTAL KNEE    . ROTATOR CUFF REPAIR Left 2007   acute open; accident    Family History  Problem Relation Age of Onset  . CVA Mother   . Hypertension Mother   . Hypertension Sister        brain tumor  . Breast cancer Neg Hx   . Colon cancer Neg Hx     SOCIAL HX: reviewed.     Current Outpatient Medications:  .  amLODipine (NORVASC) 10 MG tablet, TAKE 1 TABLET BY MOUTH EVERY DAY, Disp: 90 tablet, Rfl: 1 .  ASPIRIN 81 PO, Take by mouth daily., Disp: , Rfl:  .  atenolol (TENORMIN) 50 MG tablet, TAKE 1 TABLET BY MOUTH EVERY DAY, Disp: 90 tablet, Rfl: 1 .  brimonidine (ALPHAGAN) 0.2 % ophthalmic solution, 1 drop 2 (two) times daily. , Disp: , Rfl:  .  HYDROcodone-acetaminophen (NORCO/VICODIN) 5-325 MG tablet, Take 1 tablet by mouth every 6 (six) hours as needed for moderate pain., Disp: , Rfl:  .  HYDROmorphone (DILAUDID) 2 MG tablet,  Take 1-2 tablets (2-4 mg total) by mouth every 3 (three) hours as needed. 1-2 tablets (67m - 4 mg) (Patient not taking: Reported on 02/03/2019), Disp: 120 tablet, Rfl: 0 .  latanoprost (XALATAN) 0.005 % ophthalmic solution, Place 1 drop into both eyes at bedtime. , Disp: , Rfl:  .  lisinopril (ZESTRIL) 40 MG tablet, TAKE 1 TABLET BY MOUTH EVERY DAY, Disp: 90 tablet, Rfl: 1 .  nystatin cream (MYCOSTATIN), Apply 1 application topically 2 (two) times daily., Disp: 30 g, Rfl: 0 .  rosuvastatin (CRESTOR) 10 MG tablet, TAKE 1 TABLET BY MOUTH EVERY DAY, Disp: 90 tablet, Rfl: 1 .  sulfaSALAzine (AZULFIDINE) 500 MG tablet, Take 1,500 mg by mouth 2 (two) times daily. 3 tabs, Disp: , Rfl:  .  timolol (TIMOPTIC) 0.5 % ophthalmic solution, Place 1 drop into both eyes 2 (two) times daily., Disp: , Rfl: 3  EXAM:  VITALS per patient if applicable:  1563/87 weight 148lbs.   GENERAL: alert.  Sounds to be in no acute distress.  Answering questions appropriately.    PSYCH/NEURO: pleasant and cooperative, no obvious depression or anxiety, speech and thought processing grossly intact  ASSESSMENT AND PLAN:  Discussed the following assessment and plan:  Crohn's disease Followed by Dr JEvalee Mutton  Recent colonoscopy.  Bowels stable.    Vaginal burning Burning when urine touches the skin.  Will check urine to confirm no infection.  Nystatin cream as directed.  Will f/u regarding urine culture.  Notify me if persistent symptoms.    Hypertension Blood pressure under good control.  Continue same medication regimen.  Follow pressures.  Follow metabolic panel.      I discussed the assessment and treatment plan with the patient. The patient was provided an opportunity to ask questions and all were answered. The patient agreed with the plan and demonstrated an understanding of the instructions.   The patient was advised to call back or seek an in-person evaluation if the symptoms worsen or if the condition fails to  improve as anticipated.  I provided 12 minutes of non-face-to-face time during this encounter.   CEinar Pheasant MD

## 2019-05-12 LAB — URINE CULTURE
MICRO NUMBER:: 934259
SPECIMEN QUALITY:: ADEQUATE

## 2019-05-13 ENCOUNTER — Telehealth: Payer: Self-pay

## 2019-05-13 DIAGNOSIS — H401112 Primary open-angle glaucoma, right eye, moderate stage: Secondary | ICD-10-CM | POA: Diagnosis not present

## 2019-05-13 NOTE — Telephone Encounter (Signed)
Copied from [place] [id number]. Topic: General - Other >> May 13, 2019 11:55 AM Keene Breath wrote: Reason for CRM: Patient called to give Larena Glassman a message.  She stated that she still has itching and would like to discuss.  CB# [phone]

## 2019-05-15 ENCOUNTER — Encounter: Payer: Self-pay | Admitting: Internal Medicine

## 2019-05-15 DIAGNOSIS — N949 Unspecified condition associated with female genital organs and menstrual cycle: Secondary | ICD-10-CM | POA: Insufficient documentation

## 2019-05-15 NOTE — Assessment & Plan Note (Signed)
Blood pressure under good control.  Continue same medication regimen.  Follow pressures.  Follow metabolic panel.   

## 2019-05-15 NOTE — Assessment & Plan Note (Signed)
Followed by Dr Evalee Mutton.  Recent colonoscopy.  Bowels stable.

## 2019-05-15 NOTE — Assessment & Plan Note (Signed)
Burning when urine touches the skin.  Will check urine to confirm no infection.  Nystatin cream as directed.  Will f/u regarding urine culture.  Notify me if persistent symptoms.

## 2019-05-17 NOTE — Telephone Encounter (Signed)
Patient called in stating she is returning a call from [place] Friday and is still waiting to hear back. Please advise.

## 2019-05-17 NOTE — Telephone Encounter (Signed)
Returning a call to [place] from Friday.  Marco Raper,cma

## 2019-05-17 NOTE — Telephone Encounter (Signed)
Pt scheduled for tomorrow. Still having itching and burning. Nystatin cream did not help.

## 2019-05-18 ENCOUNTER — Other Ambulatory Visit (HOSPITAL_COMMUNITY)
Admission: RE | Admit: 2019-05-18 | Discharge: 2019-05-18 | Disposition: A | Discharge status: Home / Self Care | Payer: Medicare HMO | Source: Ambulatory Visit | Attending: Internal Medicine | Admitting: Internal Medicine

## 2019-05-18 ENCOUNTER — Ambulatory Visit
Admission: RE | Admit: 2019-05-18 | Discharge: 2019-05-18 | Disposition: A | Discharge status: Home / Self Care | Payer: Medicare HMO | Source: Ambulatory Visit | Attending: Internal Medicine | Admitting: Internal Medicine

## 2019-05-18 ENCOUNTER — Ambulatory Visit (INDEPENDENT_AMBULATORY_CARE_PROVIDER_SITE_OTHER): Payer: Medicare HMO | Admitting: Internal Medicine

## 2019-05-18 ENCOUNTER — Other Ambulatory Visit: Payer: Self-pay

## 2019-05-18 VITALS — BP 138/78 | HR 69 | Temp 97.3°F | Resp 16 | Wt 152.0 lb

## 2019-05-18 DIAGNOSIS — M7989 Other specified soft tissue disorders: Secondary | ICD-10-CM | POA: Diagnosis not present

## 2019-05-18 DIAGNOSIS — R109 Unspecified abdominal pain: Secondary | ICD-10-CM | POA: Insufficient documentation

## 2019-05-18 DIAGNOSIS — K5010 Crohn's disease of large intestine without complications: Secondary | ICD-10-CM | POA: Diagnosis not present

## 2019-05-18 DIAGNOSIS — M79651 Pain in right thigh: Secondary | ICD-10-CM | POA: Diagnosis not present

## 2019-05-18 DIAGNOSIS — N949 Unspecified condition associated with female genital organs and menstrual cycle: Secondary | ICD-10-CM

## 2019-05-18 DIAGNOSIS — N760 Acute vaginitis: Secondary | ICD-10-CM

## 2019-05-18 DIAGNOSIS — I10 Essential (primary) hypertension: Secondary | ICD-10-CM | POA: Diagnosis not present

## 2019-05-18 DIAGNOSIS — N9489 Other specified conditions associated with female genital organs and menstrual cycle: Secondary | ICD-10-CM

## 2019-05-18 DIAGNOSIS — E538 Deficiency of other specified B group vitamins: Secondary | ICD-10-CM

## 2019-05-18 LAB — CBC WITH DIFFERENTIAL/PLATELET
Basophils Absolute: 0 10*3/uL (ref 0.0–0.1)
Basophils Relative: 0.6 % (ref 0.0–3.0)
Eosinophils Absolute: 0.1 10*3/uL (ref 0.0–0.7)
Eosinophils Relative: 1.5 % (ref 0.0–5.0)
HCT: 36.7 % (ref 36.0–46.0)
Hemoglobin: 12.1 g/dL (ref 12.0–15.0)
Lymphocytes Relative: 25.7 % (ref 12.0–46.0)
Lymphs Abs: 2.2 10*3/uL (ref 0.7–4.0)
MCHC: 32.9 g/dL (ref 30.0–36.0)
MCV: 96.5 fl (ref 78.0–100.0)
Monocytes Absolute: 0.8 10*3/uL (ref 0.1–1.0)
Monocytes Relative: 9.6 % (ref 3.0–12.0)
Neutro Abs: 5.3 10*3/uL (ref 1.4–7.7)
Neutrophils Relative %: 62.6 % (ref 43.0–77.0)
Platelets: 190 10*3/uL (ref 150.0–400.0)
RBC: 3.8 Mil/uL — ABNORMAL LOW (ref 3.87–5.11)
RDW: 13.1 % (ref 11.5–15.5)
WBC: 8.4 10*3/uL (ref 4.0–10.5)

## 2019-05-18 LAB — URINALYSIS, ROUTINE W REFLEX MICROSCOPIC
Hgb urine dipstick: NEGATIVE
Leukocytes,Ua: NEGATIVE
Nitrite: NEGATIVE
RBC / HPF: NONE SEEN (ref 0–?)
Specific Gravity, Urine: >=1.03 — AB (ref 1.000–1.030)
Total Protein, Urine: NEGATIVE
Urine Glucose: 100 — AB
Urobilinogen, UA: 0.2 (ref 0.0–1.0)
pH: 5 (ref 5.0–8.0)

## 2019-05-18 LAB — HEPATIC FUNCTION PANEL
ALT: 21 U/L (ref 0–35)
AST: 28 U/L (ref 0–37)
Albumin: 4.4 g/dL (ref 3.5–5.2)
Alkaline Phosphatase: 71 U/L (ref 39–117)
Bilirubin, Direct: 0.1 mg/dL (ref 0.0–0.3)
Total Bilirubin: 0.4 mg/dL (ref 0.2–1.2)
Total Protein: 7.2 g/dL (ref 6.0–8.3)

## 2019-05-18 LAB — BASIC METABOLIC PANEL
BUN: 12 mg/dL (ref 6–23)
CO2: 31 mEq/L (ref 19–32)
Calcium: 10 mg/dL (ref 8.4–10.5)
Chloride: 108 mEq/L (ref 96–112)
Creatinine, Ser: 0.82 mg/dL (ref 0.40–1.20)
GFR: 82.57 mL/min (ref >60.00)
Glucose, Bld: 84 mg/dL (ref 70–99)
Potassium: 3.3 mEq/L — ABNORMAL LOW (ref 3.5–5.1)
Sodium: 144 mEq/L (ref 135–145)

## 2019-05-18 NOTE — Progress Notes (Signed)
Patient ID: Ellen Drake, female   DOB: [birth year], 73 y.o.   MRN: [id number]   Subjective:    Patient ID: Ellen Drake, female    DOB: [birth year], 73 y.o.   MRN: [id number]  HPI  Patient here as a work in appt to discuss some persistent vaginal irritation.  She was seen recently and prescribed nystatin cream.  Still with vaginal burning.  Feels burning when urine touches the peri vaginal tissue.  Some lower abdominal pressure.  No other abdominal pain.  Bowels stable.  Eating.  No vomiting.  No fever.  Symptoms started after colonoscopy.  Does report more swelling in her lower leg - thigh.  Persistent knee pain, but increased swelling in her upper leg.     Past Medical History:  Diagnosis Date  . Anemia    reslved  . Colon polyp 2009  . Crohn's disease ([place])   . Diverticulosis   . Fibrocystic breast disease   . Herniated nucleus pulposus of lumbosacral region   . History of chicken pox   . Hypercholesterolemia   . Hyperlipidemia   . Hypertension   . Osteoarthritis   . Pulmonary nodule seen on imaging study 2004   resolved 2007   Past Surgical History:  Procedure Laterality Date  . anterior cervical discectomy and fusion  1998   Dr Rolin Barry  . BACK SURGERY  1992   ruptured disc (Dr Mauri Pole)  . BREAST BIOPSY Left    neg-no scar seen  . CATARACT EXTRACTION W/PHACO Right 02/09/2019   Procedure: CATARACT EXTRACTION PHACO AND INTRAOCULAR LENS PLACEMENT ([place]) [place]  RIGHT HEALON 5, VISION BLUE;  Surgeon: Leandrew Koyanagi, MD;  Location: [facility];  Service: Ophthalmology;  Laterality: Right;  . COLECTOMY  2003  . COLON SURGERY  1998  . COLONOSCOPY  2009  . COLONOSCOPY W/ BIOPSIES  11/30/2012   Procedure: COLONOSCOPY W/BIOPSY; Surgeon: Colvin Caroli, MD; Location: [facility]; Service: Gastroenterology;;  . COLONOSCOPY W/ BIOPSIES  04/15/2016   Procedure: Colonoscopy ; Surgeon: Colvin Caroli, MD; Location: [facility]; Service: Gastroenterology; Laterality: N/A;  . KNEE ARTHROSCOPY W/ SYNOVECTOMY Right 03/10/2014   limited  . KNEE SURGERY Right 1/16   [facility] (Dr. Sherlynn Carbon)  . POSTERIOR LAMINECTOMY / DECOMPRESSION LUMBAR SPINE  1999  . REPLACEMENT TOTAL KNEE    . ROTATOR CUFF REPAIR Left 2007   acute open; accident   Family History  Problem Relation Age of Onset  . CVA Mother   . Hypertension Mother   . Hypertension Sister        brain tumor  . Breast cancer Neg Hx   . Colon cancer Neg Hx    Social History   Socioeconomic History  . Marital status: Married    Spouse name: Not on file  . Number of children: 0  . Years of education: 15  . Highest education level: Not on file  Occupational History  . Not on file  Social Needs  . Financial resource strain: Not on file  . Food insecurity    Worry: Not on file    Inability: Not on file  . Transportation needs    Medical: Not on file    Non-medical: Not on file  Tobacco Use  . Smoking status: Former Smoker    Quit date: 09/10/2009    Years since quitting: 9.7  . Smokeless tobacco: Never Used  Substance and Sexual Activity  . Alcohol use: Yes  Alcohol/week: 1.0 standard drinks    Types: 1 Glasses of wine per week    Comment: wine occasional; socially  . Drug use: No  . Sexual activity: Yes  Lifestyle  . Physical activity    Days per week: Not on file    Minutes per session: Not on file  . Stress: Not on file  Relationships  . Social Herbalist on phone: Not on file    Gets together: Not on file    Attends religious service: Not on file    Active member of club or organization: Not on file    Attends meetings of clubs or organizations: Not on file    Relationship status: Not on file  Other Topics Concern  . Not on file  Social History Narrative  . Not on file    Outpatient Encounter Medications as of 05/18/2019  Medication Sig  . amLODipine (NORVASC) 10 MG tablet TAKE 1 TABLET BY MOUTH EVERY  DAY  . ASPIRIN 81 PO Take by mouth daily.  Marland Kitchen atenolol (TENORMIN) 50 MG tablet TAKE 1 TABLET BY MOUTH EVERY DAY  . brimonidine (ALPHAGAN) 0.2 % ophthalmic solution 1 drop 2 (two) times daily.   Marland Kitchen HYDROcodone-acetaminophen (NORCO/VICODIN) 5-325 MG tablet Take 1 tablet by mouth every 6 (six) hours as needed for moderate pain.  Marland Kitchen HYDROmorphone (DILAUDID) 4 MG tablet TAKE 1 EVERY 6 HOURS FOR PAIN MAY FILL ON 05/04/19  . latanoprost (XALATAN) 0.005 % ophthalmic solution Place 1 drop into both eyes at bedtime.   Marland Kitchen lisinopril (ZESTRIL) 40 MG tablet TAKE 1 TABLET BY MOUTH EVERY DAY  . nystatin cream (MYCOSTATIN) Apply 1 application topically 2 (two) times daily.  . rosuvastatin (CRESTOR) 10 MG tablet TAKE 1 TABLET BY MOUTH EVERY DAY  . sulfaSALAzine (AZULFIDINE) 500 MG tablet Take 1,500 mg by mouth 2 (two) times daily. 3 tabs  . timolol (TIMOPTIC) 0.5 % ophthalmic solution Place 1 drop into both eyes 2 (two) times daily.  . [DISCONTINUED] HYDROmorphone (DILAUDID) 2 MG tablet Take 1-2 tablets (2-4 mg total) by mouth every 3 (three) hours as needed. 1-2 tablets (20m - 4 mg) (Patient not taking: Reported on 02/03/2019)   No facility-administered encounter medications on file as of 05/18/2019.    Review of Systems  Constitutional: Negative for appetite change and fever.  Respiratory: Negative for cough and shortness of breath.   Cardiovascular: Negative for chest pain and leg swelling.  Gastrointestinal: Negative for diarrhea, nausea and vomiting.       Some abdominal pressure as outlined.    Genitourinary: Negative for difficulty urinating and vaginal discharge.       Vaginal burning with urination.   Musculoskeletal: Negative for joint swelling and myalgias.  Skin: Negative for color change and rash.  Neurological: Negative for dizziness, light-headedness and headaches.  Psychiatric/Behavioral: Negative for agitation and dysphoric mood.       Objective:    Physical Exam Constitutional:       General: She is not in acute distress.    Appearance: Normal appearance.  HENT:     Right Ear: External ear normal.     Left Ear: External ear normal.  Eyes:     General: No scleral icterus.       Right eye: No discharge.        Left eye: No discharge.     Conjunctiva/sclera: Conjunctivae normal.  Neck:     Musculoskeletal: Neck supple. No muscular tenderness.     Thyroid: No  thyromegaly.  Cardiovascular:     Rate and Rhythm: Normal rate and regular rhythm.  Pulmonary:     Effort: No respiratory distress.     Breath sounds: Normal breath sounds. No wheezing.  Abdominal:     General: Bowel sounds are normal.     Palpations: Abdomen is soft.     Tenderness: There is no abdominal tenderness.  Genitourinary:    Comments: Minimal erythema - peri vaginal region.  Vaginal vault without lesions.  KOH/wet prep obtined.   Could not appreciate any adnexal masses or tenderness.   Musculoskeletal:        General: No swelling or tenderness.  Lymphadenopathy:     Cervical: No cervical adenopathy.  Skin:    Findings: No erythema or rash.  Neurological:     Mental Status: She is alert.  Psychiatric:        Mood and Affect: Mood normal.        Behavior: Behavior normal.     BP 138/78   Pulse 69   Temp (!) 97.3 F (36.3 C)   Resp 16   Wt 152 lb (68.9 kg)   LMP 08/13/1995   SpO2 97%   BMI 25.29 kg/m  Wt Readings from Last 3 Encounters:  05/18/19 152 lb (68.9 kg)  05/15/19 148 lb (67.1 kg)  03/17/19 152 lb 6.4 oz (69.1 kg)     Lab Results  Component Value Date   WBC 8.4 05/18/2019   HGB 12.1 05/18/2019   HCT 36.7 05/18/2019   PLT 190.0 05/18/2019   GLUCOSE 84 05/18/2019   CHOL 142 03/17/2019   TRIG 116.0 03/17/2019   HDL 66.50 03/17/2019   LDLDIRECT 132.5 05/06/2013   LDLCALC 53 03/17/2019   ALT 21 05/18/2019   AST 28 05/18/2019   NA 144 05/18/2019   K 3.3 (L) 05/18/2019   CL 108 05/18/2019   CREATININE 0.82 05/18/2019   BUN 12 05/18/2019   CO2 31 05/18/2019    TSH 1.57 03/17/2019   HGBA1C 5.7 10/19/2012       Assessment & Plan:   Problem List Items Addressed This Visit    Abdominal pain    Lower abdominal pressure as outlined.  Check KUB.  Bowels moving.        Relevant Orders   Urinalysis, Routine w reflex microscopic (Completed)   Urine Culture (Completed)   DG Abd 2 Views (Completed)   B12 deficiency    Continue B12 injections.       Crohn's disease ([place])    Followed by GI - Dr Evalee Mutton.  Just had f/u colonoscopy.  Bowels moving.        Hypertension - Primary    Blood pressure under good control.  Continue same medication regimen.  Follow pressures.  Follow metabolic panel.        Relevant Orders   CBC with Differential/Platelet (Completed)   Hepatic function panel (Completed)   Basic metabolic panel (Completed)   Right leg swelling    Has had knee pain and swelling.  Has been worked up extensively.  Now with increased right upper leg swelling.  New.  Obtain ultrasound to confirm no DVT      Relevant Orders   US Venous Img Lower Unilateral Right (Completed)   Vaginal burning    Persistent vaginal burning as outlined.  Exam as outlined.  Treat with diflucan.  KOH/wet prep obtained.  Check urine to confirm no infection.         Other Visit  Diagnoses    Acute vaginitis       Relevant Orders   Cervicovaginal ancillary only( )       Einar Pheasant, MD

## 2019-05-19 ENCOUNTER — Telehealth: Payer: Self-pay

## 2019-05-19 DIAGNOSIS — E876 Hypokalemia: Secondary | ICD-10-CM

## 2019-05-19 NOTE — Telephone Encounter (Signed)
Copied from [name] [id number]. Topic: General - Inquiry >> May 19, 2019  4:55 PM Ellen Drake wrote: Reason for CRM:   Pt is calling to see if lab results are ready.

## 2019-05-20 ENCOUNTER — Telehealth: Payer: Self-pay | Admitting: *Deleted

## 2019-05-20 DIAGNOSIS — N760 Acute vaginitis: Secondary | ICD-10-CM

## 2019-05-20 LAB — URINE CULTURE
MICRO NUMBER:: 964491
SPECIMEN QUALITY:: ADEQUATE

## 2019-05-20 MED ORDER — FLUCONAZOLE 150 MG PO TABS: 150.0000 mg | ORAL_TABLET | Freq: Once | ORAL | 0 refills | Status: AC

## 2019-05-20 NOTE — Telephone Encounter (Signed)
-----   Message from Einar Pheasant, MD sent at 05/20/2019  5:48 AM EDT ----- Please notify pt that her urine culture is negative for infection.  Vaginal swab results pending.  Abdominal xray reveals no acute abnormality.  I had sent message on her other labs.  See other result note on this pt. Please go ahead and notify her of all of these results and get her started on the diflucan - per recommendation from previous result note.  Thanks

## 2019-05-20 NOTE — Telephone Encounter (Signed)
PEC verbal Diflucan ordered patient aware and voice understanding to labs

## 2019-05-20 NOTE — Telephone Encounter (Signed)
Patient has been notified of labs and appropriate med ordered and labs.

## 2019-05-22 ENCOUNTER — Encounter: Payer: Self-pay | Admitting: Internal Medicine

## 2019-05-22 NOTE — Assessment & Plan Note (Signed)
Lower abdominal pressure as outlined.  Check KUB.  Bowels moving.

## 2019-05-22 NOTE — Assessment & Plan Note (Signed)
Followed by GI - Dr Evalee Mutton.  Just had f/u colonoscopy.  Bowels moving.

## 2019-05-22 NOTE — Assessment & Plan Note (Signed)
Continue B12 injections.

## 2019-05-22 NOTE — Assessment & Plan Note (Signed)
Has had knee pain and swelling.  Has been worked up extensively.  Now with increased right upper leg swelling.  New.  Obtain ultrasound to confirm no DVT

## 2019-05-22 NOTE — Assessment & Plan Note (Signed)
Persistent vaginal burning as outlined.  Exam as outlined.  Treat with diflucan.  KOH/wet prep obtained.  Check urine to confirm no infection.

## 2019-05-22 NOTE — Assessment & Plan Note (Signed)
Blood pressure under good control.  Continue same medication regimen.  Follow pressures.  Follow metabolic panel.   

## 2019-05-23 ENCOUNTER — Other Ambulatory Visit: Payer: Self-pay

## 2019-05-23 ENCOUNTER — Other Ambulatory Visit: Payer: Self-pay | Admitting: Internal Medicine

## 2019-05-23 ENCOUNTER — Telehealth: Payer: Self-pay

## 2019-05-23 MED ORDER — FLUCONAZOLE 150 MG PO TABS: 150.0000 mg | ORAL_TABLET | Freq: Once | ORAL | 0 refills | Status: AC

## 2019-05-23 NOTE — Telephone Encounter (Signed)
Copied from [place] [id number]. Topic: General - Other >> May 23, 2019 11:31 AM Celene Kras A wrote: Reason for CRM: Pt called stating the medication she was given on 05/19/2019 is not working. Pt is requesting advice.

## 2019-05-23 NOTE — Telephone Encounter (Signed)
She did not have a second pill of diflucan to take today. Took first dose 10/9. Sent in 1 more dose of the diflucan for her to take this afternoon. Pt is going to let me know if symptoms persist.

## 2019-05-26 LAB — CERVICOVAGINAL ANCILLARY ONLY
Bacterial Vaginitis (gardnerella): NEGATIVE
Candida Glabrata: NEGATIVE
Candida Vaginitis: NEGATIVE
Comment: NEGATIVE
Comment: NEGATIVE
Comment: NEGATIVE

## 2019-05-27 ENCOUNTER — Other Ambulatory Visit (INDEPENDENT_AMBULATORY_CARE_PROVIDER_SITE_OTHER): Payer: Medicare HMO

## 2019-05-27 ENCOUNTER — Other Ambulatory Visit: Payer: Self-pay

## 2019-05-27 DIAGNOSIS — E876 Hypokalemia: Secondary | ICD-10-CM | POA: Diagnosis not present

## 2019-05-27 LAB — POTASSIUM: Potassium: 3.5 mEq/L (ref 3.5–5.1)

## 2019-05-31 NOTE — Telephone Encounter (Signed)
error 

## 2019-07-01 DIAGNOSIS — Z79899 Other long term (current) drug therapy: Secondary | ICD-10-CM | POA: Diagnosis not present

## 2019-07-01 DIAGNOSIS — M545 Low back pain: Secondary | ICD-10-CM | POA: Diagnosis not present

## 2019-07-01 DIAGNOSIS — H401132 Primary open-angle glaucoma, bilateral, moderate stage: Secondary | ICD-10-CM | POA: Diagnosis not present

## 2019-07-01 DIAGNOSIS — M25569 Pain in unspecified knee: Secondary | ICD-10-CM | POA: Diagnosis not present

## 2019-07-01 DIAGNOSIS — G8929 Other chronic pain: Secondary | ICD-10-CM | POA: Diagnosis not present

## 2019-07-01 DIAGNOSIS — G894 Chronic pain syndrome: Secondary | ICD-10-CM | POA: Diagnosis not present

## 2019-07-01 DIAGNOSIS — Z79891 Long term (current) use of opiate analgesic: Secondary | ICD-10-CM | POA: Diagnosis not present

## 2019-07-01 DIAGNOSIS — M25561 Pain in right knee: Secondary | ICD-10-CM | POA: Diagnosis not present

## 2019-07-14 ENCOUNTER — Ambulatory Visit (INDEPENDENT_AMBULATORY_CARE_PROVIDER_SITE_OTHER): Payer: Medicare HMO

## 2019-07-14 ENCOUNTER — Other Ambulatory Visit: Payer: Self-pay

## 2019-07-14 DIAGNOSIS — Z0000 Encounter for general adult medical examination without abnormal findings: Secondary | ICD-10-CM

## 2019-07-14 NOTE — Progress Notes (Addendum)
Subjective:   Ellen Drake is a 73 y.o. female who presents for Medicare Annual (Subsequent) preventive examination.  Review of Systems:  No ROS.  Medicare Wellness Virtual Visit.  Visual/audio telehealth visit, UTA vital signs.   See social history for additional risk factors.   Cardiac Risk Factors include: advanced age (>29mn, >>54women);hypertension     Objective:     Vitals: LMP 08/13/1995   There is no height or weight on file to calculate BMI.  Advanced Directives 07/14/2019 02/09/2019 03/27/2017 07/21/2016 07/20/2015  Does Patient Have a Medical Advance Directive? No No No No No  Would patient like information on creating a medical advance directive? Yes (MAU/Ambulatory/Procedural Areas - Information given) No - Patient declined - No - Patient declined Yes - Educational materials given    Tobacco Social History   Tobacco Use  Smoking Status Former Smoker  . Quit date: 09/10/2009  . Years since quitting: 9.8  Smokeless Tobacco Never Used     Counseling given: Not Answered   Clinical Intake:  Pre-visit preparation completed: Yes        Diabetes: No  How often do you need to have someone help you when you read instructions, pamphlets, or other written materials from your doctor or pharmacy?: 1 - Never  Interpreter Needed?: No     Past Medical History:  Diagnosis Date  . Anemia    reslved  . Colon polyp 2009  . [place]   . Diverticulosis   . Fibrocystic breast disease   . Herniated nucleus pulposus of lumbosacral region   . History of chicken pox   . Hypercholesterolemia   . Hyperlipidemia   . Hypertension   . Osteoarthritis   . Pulmonary nodule seen on imaging study 2004   resolved 2007   Past Surgical History:  Procedure Laterality Date  . anterior cervical discectomy and fusion  1998   Dr DRolin Barry . BACK SURGERY  1992   ruptured disc (Dr CMauri Pole  . BREAST BIOPSY Left    neg-no scar seen  . CATARACT EXTRACTION W/PHACO  Right 02/09/2019   Procedure: CATARACT EXTRACTION PHACO AND [place]TRAOCULAR L[place] PLACEMENT [place] RIGHT HEALON 5, VISION BLUE;  Surgeon: BLeandrew Koyanagi MD;  [facility] [facility]  Service: Ophthalmology;  Laterality: Right;  . COLECTOMY  2003  . COLON SURGERY  1998  . COLONOSCOPY  2009  . COLONOSCOPY W/ BIOPSIES  11/30/2012   Procedure: COLONOSCOPY W/BIOPSY; Surgeon: JColvin Caroli MD; [place] [facility] Service: Gastroenterology;;  . COLONOSCOPY W/ BIOPSIES  04/15/2016   Procedure: Colonoscopy ; Surgeon: JColvin Caroli MD; [place] Service: [place]; Laterality: N/A;  . KNEE ARTHROSCOPY W/ SYNOVECTOMY Right 03/10/2014   limited  . [facility]  . POSTERIOR LAMINECTOMY / DECOMPRESSION LUMBAR SPINE  1999  . REPLACEMENT TOTAL KNEE    . ROTATOR CUFF REPAIR Left 2007   acute open; accident   Family History  Problem Relation Age of Onset  . CVA Mother   . Hypertension Mother   . Hypertension Sister        brain tumor  . Breast cancer Neg Hx   . Colon cancer Neg Hx    Social History   Socioeconomic History  . Marital status: Married    Spouse name: Not on file  . Number of children: 0  . Years of education: 1100 . Highest education  level: Not on file  Occupational History  . Not on file  Social Needs  . Financial resource strain: Not hard at all  . Food insecurity    Worry: Never true    Inability: Never true  . Transportation needs    Medical: No    Non-medical: No  Tobacco Use  . Smoking status: Former Smoker    Quit date: 09/10/2009    Years since quitting: 9.8  . Smokeless tobacco: Never Used  Substance and Sexual Activity  . Alcohol use: Yes    Alcohol/week: 1.0 standard drinks    Types: 1 Glasses of wine per week    Comment: wine occasional; socially  . Drug use: No  . Sexual activity: Yes  Lifestyle  . Physical activity    Days  per week: 0 days    Minutes per session: Not on file  . Stress: Not at all  Relationships  . Social Herbalist on phone: Not on file    Gets together: Not on file    Attends religious service: Not on file    Active member of club or organization: Not on file    Attends meetings of clubs or organizations: Not on file    Relationship status: Not on file  Other Topics Concern  . Not on file  Social History Narrative  . Not on file    Outpatient Encounter Medications as of 07/14/2019  Medication Sig  . amLODipine (NORVASC) 10 MG tablet TAKE 1 TABLET BY MOUTH EVERY DAY  . ASPIRIN 81 PO Take by mouth daily.  Marland Kitchen atenolol (TENORMIN) 50 MG tablet TAKE 1 TABLET BY MOUTH EVERY DAY  . brimonidine (ALPHAGAN) 0.2 % ophthalmic solution 1 drop 2 (two) times daily.   Marland Kitchen HYDROcodone-acetaminophen (NORCO/VICODIN) 5-325 MG tablet Take 1 tablet by mouth every 6 (six) hours as needed for moderate pain.  Marland Kitchen HYDROmorphone (DILAUDID) 4 MG tablet TAKE 1 EVERY 6 HOURS FOR PAIN MAY FILL ON 05/04/19  . latanoprost (XALATAN) 0.005 % ophthalmic solution Place 1 drop into both eyes at bedtime.   Marland Kitchen lisinopril (ZESTRIL) 40 MG tablet TAKE 1 TABLET BY MOUTH EVERY DAY  . nystatin cream (MYCOSTATIN) Apply 1 application topically 2 (two) times daily.  . rosuvastatin (CRESTOR) 10 MG tablet TAKE 1 TABLET BY MOUTH EVERY DAY  . sulfaSALAzine (AZULFIDINE) 500 MG tablet Take 1,500 mg by mouth 2 (two) times daily. 3 tabs  . timolol (TIMOPTIC) 0.5 % ophthalmic solution Place 1 drop into both eyes 2 (two) times daily.   No facility-administered encounter medications on file as of 07/14/2019.     Activities of Daily Living In your present state of health, do you have any difficulty performing the following activities: 07/14/2019 02/09/2019  Hearing? N N  Vision? N N  Difficulty concentrating or making decisions? N N  Walking or climbing stairs? Y N  Comment Ambulates with cane as needed when knee is fatigued. -  Dressing  or bathing? N N  Doing errands, shopping? N -  Preparing Food and eating ? N -  Using the Toilet? N -  In the past six months, have you accidently leaked urine? N -  Do you have problems with loss of bowel control? N -  Managing your Medications? N -  Managing your Finances? N -  Housekeeping or managing your Housekeeping? N -  Some recent data might be hidden    Patient Care Team: Einar Pheasant, MD as PCP - General (Internal Medicine)  Assessment:   This is a routine wellness examination for Cheryel.  Nurse connected with patient 07/14/19 at 12:00 PM EST by a telephone enabled telemedicine application and verified that I am speaking with the correct person using two identifiers. Patient stated full name and DOB. Patient gave permission to continue with virtual visit. Patient's location was at home and Nurse's location was at [place] office.   Health Maintenance Due: -Influenza vaccine 2020- discussed; to be completed in season with doctor or local pharmacy.   -Hepatitis C Screening- postponed per patient preference.  -Tdap- discussed; to be completed with doctor in visit or local pharmacy.   Update all pending maintenance due as appropriate.   See completed HM at the end of note.   Eye: Visual acuity not assessed. Virtual visit. Wears corrective lenses. Followed by their ophthalmologist every 12 months.   Dental: Visits every 6 months.    Hearing: Demonstrates normal hearing during visit.  Safety:  Patient feels safe at home- yes Patient does have smoke detectors at home- yes Patient does wear sunscreen or protective clothing when in direct sunlight - yes Patient does wear seat belt when in a moving vehicle - yes Patient drives- yes Adequate lighting in walkways free from debris- yes Grab bars and handrails used as appropriate- yes Ambulates with a cane as an assistive device Cell phone on person when ambulating outside of the home- yes  Social: Alcohol intake -   yes      Smoking history- former    Smokers in home? none Illicit drug use? none  Depression: PHQ 2 &9 complete. See screening below. Denies irritability, anhedonia, sadness/tearfullness.    Falls: See screening below.    Medication: Taking as directed and without issues.   Covid-19: Precautions and sickness symptoms discussed. Wears mask, social distancing, hand hygiene as appropriate.   Activities of Daily Living Patient denies needing assistance with: household chores, feeding themselves, getting from bed to chair, getting to the toilet, bathing/showering, dressing, managing money, or preparing meals.   Memory: Patient is alert. Patient denies difficulty focusing or concentrating. Correctly identified the president of the [place], season and recall 2/3. Patient likes to read the newspaper and use tablet for brain games for brain stimulation.   BMI- discussed the importance of a healthy diet, water intake and the benefits of aerobic exercise.  Educational material provided.  Physical activity- active around the home, walking as tolerated, stretching, no routine.    Diet:  Regular Water: good intake  Other Providers Patient Care Team: Einar Pheasant, MD as PCP - General (Internal Medicine)  Exercise Activities and Dietary recommendations Current Exercise Habits: Home exercise routine, Type of exercise: walking;stretching, Intensity: Mild  Goals      Patient Stated   . Follow up with Primary Care Provider (pt-stated)     As needed       Fall Risk Fall Risk  07/14/2019 07/21/2016 07/14/2016 07/02/2016 04/28/2016  Falls in the past year? 0 No No No No  Follow up Falls prevention discussed;Education provided - - - -   Timed Get Up and Go performed: no, virtual visit  Depression Screen PHQ 2/9 Scores 07/14/2019 03/17/2019 07/21/2016 07/14/2016  PHQ - 2 Score 0 0 0 0     Cognitive Function MMSE - Mini Mental State Exam 07/21/2016 07/20/2015  Orientation to time 5 5   Orientation to Place 5 5  Registration 3 3  Attention/ Calculation 5 5  Recall 2 3  Recall-comments 2 out of 3 recalled -  Language- name 2 objects 2 2  Language- repeat 1 1  Language- follow 3 step command 3 3  Language- read & follow direction 1 1  Write a sentence 1 1  Copy design 1 1  Total score 29 30     6CIT Screen 07/14/2019  What Year? 0 points  What month? 0 points  What time? 0 points  Count back from 20 0 points  Months in reverse 0 points    Immunization History  Administered Date(s) Administered  . Influenza Split 05/13/2011, 08/12/2012, 04/29/2014  . Influenza, High Dose Seasonal PF 07/14/2016, 07/15/2017, 04/29/2018  . Influenza, Seasonal, Injecte, Preservative Fre 06/05/2014  . Influenza,inj,Quad PF,6+ Mos 05/06/2013, 06/08/2015  . Pneumococcal Conjugate-13 07/21/2016  . Pneumococcal Polysaccharide-23 09/01/2014   Screening Tests Health Maintenance  Topic Date Due  . Hepatitis C Screening  [birth year]  . TETANUS/TDAP  11/18/1964  . INFLUENZA VACCINE  11/09/2019 (Originally 03/12/2019)  . MAMMOGRAM  09/03/2019  . COLONOSCOPY  12/01/2022  . DEXA SCAN  Completed  . PNA vac Low Risk Adult  Completed      Plan:   Keep all routine maintenance appointments.   Follow up 07/22/19 @ 1030  Medicare Attestation I have personally reviewed: The patient's medical and social history Their use of alcohol, tobacco or illicit drugs Their current medications and supplements The patient's functional ability including ADLs,fall risks, home safety risks, cognitive, and hearing and visual impairment Diet and physical activities Evidence for depression   In addition, I have reviewed and discussed with patient certain preventive protocols, quality metrics, and best practice recommendations. A written personalized care plan for preventive services as well as general preventive health recommendations were provided to patient via mail.     Varney Biles, LPN   56/03/[id number]   Reviewed above information.  Agree with assessment and plan.    Dr Nicki Reaper

## 2019-07-14 NOTE — Patient Instructions (Addendum)
  Ms. Gest , Thank you for taking time to come for your Medicare Wellness Visit. I appreciate your ongoing commitment to your health goals. Please review the following plan we discussed and let me know if I can assist you in the future.   These are the goals we discussed: Goals      Patient Stated   . Follow up with Primary Care Provider (pt-stated)     As needed       This is a list of the screening recommended for you and due dates:  Health Maintenance  Topic Date Due  .  Hepatitis C: One time screening is recommended by Center for Disease Control  (CDC) for  adults born from [birth year] through 1965.   Apr 17, 1946  . Tetanus Vaccine  11/18/1964  . Flu Shot  11/09/2019*  . Mammogram  09/03/2019  . Colon Cancer Screening  12/01/2022  . DEXA scan (bone density measurement)  Completed  . Pneumonia vaccines  Completed  *Topic was postponed. The date shown is not the original due date.

## 2019-07-22 ENCOUNTER — Other Ambulatory Visit: Payer: Self-pay

## 2019-07-22 ENCOUNTER — Ambulatory Visit (INDEPENDENT_AMBULATORY_CARE_PROVIDER_SITE_OTHER): Payer: Medicare HMO | Admitting: Internal Medicine

## 2019-07-22 VITALS — BP 130/70 | Ht 66.0 in | Wt 150.0 lb

## 2019-07-22 DIAGNOSIS — M25561 Pain in right knee: Secondary | ICD-10-CM

## 2019-07-22 DIAGNOSIS — E7800 Pure hypercholesterolemia, unspecified: Secondary | ICD-10-CM | POA: Diagnosis not present

## 2019-07-22 DIAGNOSIS — I10 Essential (primary) hypertension: Secondary | ICD-10-CM | POA: Diagnosis not present

## 2019-07-22 DIAGNOSIS — R109 Unspecified abdominal pain: Secondary | ICD-10-CM | POA: Diagnosis not present

## 2019-07-22 DIAGNOSIS — E538 Deficiency of other specified B group vitamins: Secondary | ICD-10-CM | POA: Diagnosis not present

## 2019-07-22 DIAGNOSIS — Z1231 Encounter for screening mammogram for malignant neoplasm of breast: Secondary | ICD-10-CM

## 2019-07-22 DIAGNOSIS — K5010 Crohn's disease of large intestine without complications: Secondary | ICD-10-CM

## 2019-07-22 NOTE — Progress Notes (Signed)
Patient ID: Ellen Drake, female   DOB: [birth year], 73 y.o.   MRN: [id number]   Virtual Visit via telephone Note This visit occurred during the SARS-CoV-2 public health emergency.  Safety protocols were in place, including screening questions prior to the visit, additional usage of staff PPE, and extensive cleaning of exam room while observing appropriate contact time as indicated for disinfecting solutions.  This visit type was conducted due to national recommendations for restrictions regarding the COVID-19 pandemic (e.g. social distancing).  This format is felt to be most appropriate for this patient at this time.  All issues noted in this document were discussed and addressed.  No physical exam was performed (except for noted visual exam findings with Video Visits).   I connected with Ellen Drake by telephone and verified that I am speaking with the correct person using two identifiers. Location patient: home Location provider: work  Persons participating in the telephone visit: patient, provider  I discussed the limitations, risks, security and privacy concerns of performing an evaluation and management service by telephone and the availability of in person appointments.  The patient expressed understanding and agreed to proceed.   Reason for visit: scheduled follow up  HPI: She reports she is doing well.  Has been feeling good. Still having pain and swelling in her knee.  Sees ortho.  Still stays active.  No chest pain.  No sob.  No acid reflux.  No abdominal pain.  Bowels moving.  No abdominal pain now.  Sees Dr Redmond Pulling yearly for f/u of her Crohn's.  Blood pressure doing well.  No vaginal burning now.  No urine symptoms.  Discussed bone density.  She wants to hold for now.     ROS: See pertinent positives and negatives per HPI.  Past Medical History:  Diagnosis Date  . Anemia    reslved  . Colon polyp 2009  . Crohn's disease ([place])   . Diverticulosis   . Fibrocystic breast  disease   . Herniated nucleus pulposus of lumbosacral region   . History of chicken pox   . Hypercholesterolemia   . Hyperlipidemia   . Hypertension   . Osteoarthritis   . Pulmonary nodule seen on imaging study 2004   resolved 2007    Past Surgical History:  Procedure Laterality Date  . anterior cervical discectomy and fusion  1998   Dr Rolin Barry  . BACK SURGERY  1992   ruptured disc (Dr Mauri Pole)  . BREAST BIOPSY Left    neg-no scar seen  . CATARACT EXTRACTION W/PHACO Right 02/09/2019   Procedure: CATARACT EXTRACTION PHACO AND INTRAOCULAR LENS PLACEMENT ([place]) [place]  RIGHT HEALON 5, VISION BLUE;  Surgeon: Leandrew Koyanagi, MD;  Location: [place];  Service: Ophthalmology;  Laterality: Right;  . COLECTOMY  2003  . COLON SURGERY  1998  . COLONOSCOPY  2009  . COLONOSCOPY W/ BIOPSIES  11/30/2012   Procedure: COLONOSCOPY W/BIOPSY; Surgeon: Colvin Caroli, MD; Location: [place]; Service: Gastroenterology;;  . COLONOSCOPY W/ BIOPSIES  04/15/2016   Procedure: Colonoscopy ; Surgeon: Colvin Caroli, MD; Location: [place]; Service: Gastroenterology; Laterality: N/A;  . KNEE ARTHROSCOPY W/ SYNOVECTOMY Right 03/10/2014   limited  . KNEE SURGERY Right 1/16   [facility] (Dr. Sherlynn Carbon)  . POSTERIOR LAMINECTOMY / DECOMPRESSION LUMBAR SPINE  1999  . REPLACEMENT TOTAL KNEE    . ROTATOR CUFF REPAIR Left 2007   acute open; accident    Family History  Problem  Relation Age of Onset  . CVA Mother   . Hypertension Mother   . Hypertension Sister        brain tumor  . Breast cancer Neg Hx   . Colon cancer Neg Hx     SOCIAL HX: reviewed.     Current Outpatient Medications:  .  amLODipine (NORVASC) 10 MG tablet, TAKE 1 TABLET BY MOUTH EVERY DAY, Disp: 90 tablet, Rfl: 1 .  ASPIRIN 81 PO, Take by mouth daily., Disp: , Rfl:  .  atenolol (TENORMIN) 50 MG tablet, TAKE 1 TABLET BY MOUTH EVERY DAY, Disp: 90 tablet, Rfl: 1 .   brimonidine (ALPHAGAN) 0.2 % ophthalmic solution, 1 drop 2 (two) times daily. , Disp: , Rfl:  .  HYDROcodone-acetaminophen (NORCO/VICODIN) 5-325 MG tablet, Take 1 tablet by mouth every 6 (six) hours as needed for moderate pain., Disp: , Rfl:  .  HYDROmorphone (DILAUDID) 4 MG tablet, TAKE 1 EVERY 6 HOURS FOR PAIN MAY FILL ON 05/04/19, Disp: , Rfl:  .  latanoprost (XALATAN) 0.005 % ophthalmic solution, Place 1 drop into both eyes at bedtime. , Disp: , Rfl:  .  lisinopril (ZESTRIL) 40 MG tablet, TAKE 1 TABLET BY MOUTH EVERY DAY, Disp: 90 tablet, Rfl: 1 .  nystatin cream (MYCOSTATIN), Apply 1 application topically 2 (two) times daily., Disp: 30 g, Rfl: 0 .  rosuvastatin (CRESTOR) 10 MG tablet, TAKE 1 TABLET BY MOUTH EVERY DAY, Disp: 90 tablet, Rfl: 1 .  sulfaSALAzine (AZULFIDINE) 500 MG tablet, Take 1,500 mg by mouth 2 (two) times daily. 3 tabs, Disp: , Rfl:  .  timolol (TIMOPTIC) 0.5 % ophthalmic solution, Place 1 drop into both eyes 2 (two) times daily., Disp: , Rfl: 3  EXAM:  VITALS per patient if applicable:  338/32  GENERAL: alert. Sounds to be in no acute distress.  Answering questions appropriately.    PSYCH/NEURO: pleasant and cooperative, no obvious depression or anxiety, speech and thought processing grossly intact  ASSESSMENT AND PLAN:  Discussed the following assessment and plan:  Abdominal pain No pain now.  Follow.    B12 deficiency Continue b12 injections.    Crohn's disease Followed by GI - Dr Evalee Mutton.  Currently doing well.    Hypercholesterolemia On crestor.  Low cholesterol diet and exercise.  Follow lipid panel and liver function tests.    Hypertension Blood pressure under good control.  Continue same medication regimen.  Follow pressures.  Follow metabolic panel.    Knee pain Persistent knee pain and swelling.  Seeing ortho.  Continues f/u with ortho.      I discussed the assessment and treatment plan with the patient. The patient was provided an  opportunity to ask questions and all were answered. The patient agreed with the plan and demonstrated an understanding of the instructions.   The patient was advised to call back or seek an in-person evaluation if the symptoms worsen or if the condition fails to improve as anticipated.  I provided 15 minutes of non-face-to-face time during this encounter.   Einar Pheasant, MD

## 2019-07-24 ENCOUNTER — Encounter: Payer: Self-pay | Admitting: Internal Medicine

## 2019-07-24 NOTE — Assessment & Plan Note (Signed)
Blood pressure under good control.  Continue same medication regimen.  Follow pressures.  Follow metabolic panel.   

## 2019-07-24 NOTE — Assessment & Plan Note (Signed)
Continue b12 injections.

## 2019-07-24 NOTE — Assessment & Plan Note (Signed)
Persistent knee pain and swelling.  Seeing ortho.  Continues f/u with ortho.

## 2019-07-24 NOTE — Assessment & Plan Note (Signed)
No pain now.  Follow.

## 2019-07-24 NOTE — Assessment & Plan Note (Signed)
On crestor.  Low cholesterol diet and exercise.  Follow lipid panel and liver function tests.

## 2019-07-24 NOTE — Assessment & Plan Note (Signed)
Followed by GI - Dr Evalee Mutton.  Currently doing well.

## 2019-07-26 ENCOUNTER — Other Ambulatory Visit: Payer: Self-pay

## 2019-07-26 ENCOUNTER — Ambulatory Visit (INDEPENDENT_AMBULATORY_CARE_PROVIDER_SITE_OTHER): Payer: Medicare HMO

## 2019-07-26 DIAGNOSIS — Z23 Encounter for immunization: Secondary | ICD-10-CM

## 2019-07-26 NOTE — Progress Notes (Addendum)
Patient came in today for high dose flu shot in left deltoid. Patient tolerated well.   Reviewed.  Dr Nicki Reaper

## 2019-08-02 DIAGNOSIS — H401132 Primary open-angle glaucoma, bilateral, moderate stage: Secondary | ICD-10-CM | POA: Diagnosis not present

## 2019-08-02 DIAGNOSIS — H35352 Cystoid macular degeneration, left eye: Secondary | ICD-10-CM | POA: Diagnosis not present

## 2019-08-11 ENCOUNTER — Encounter: Payer: Self-pay | Admitting: Internal Medicine

## 2019-08-14 ENCOUNTER — Other Ambulatory Visit: Payer: Self-pay | Admitting: Internal Medicine

## 2019-08-30 DIAGNOSIS — R69 Illness, unspecified: Secondary | ICD-10-CM | POA: Diagnosis not present

## 2019-09-07 ENCOUNTER — Other Ambulatory Visit: Payer: Self-pay

## 2019-09-07 ENCOUNTER — Ambulatory Visit
Admission: RE | Admit: 2019-09-07 | Discharge: 2019-09-07 | Disposition: A | Discharge status: Home / Self Care | Payer: Medicare HMO | Source: Ambulatory Visit | Attending: Internal Medicine | Admitting: Internal Medicine

## 2019-09-07 ENCOUNTER — Encounter (INDEPENDENT_AMBULATORY_CARE_PROVIDER_SITE_OTHER): Payer: Self-pay

## 2019-09-07 DIAGNOSIS — Z1231 Encounter for screening mammogram for malignant neoplasm of breast: Secondary | ICD-10-CM | POA: Diagnosis not present

## 2019-09-09 DIAGNOSIS — H35352 Cystoid macular degeneration, left eye: Secondary | ICD-10-CM | POA: Diagnosis not present

## 2019-09-30 DIAGNOSIS — G8929 Other chronic pain: Secondary | ICD-10-CM | POA: Diagnosis not present

## 2019-09-30 DIAGNOSIS — Z79891 Long term (current) use of opiate analgesic: Secondary | ICD-10-CM | POA: Diagnosis not present

## 2019-09-30 DIAGNOSIS — M25561 Pain in right knee: Secondary | ICD-10-CM | POA: Diagnosis not present

## 2019-09-30 DIAGNOSIS — G894 Chronic pain syndrome: Secondary | ICD-10-CM | POA: Diagnosis not present

## 2019-09-30 DIAGNOSIS — Z79899 Other long term (current) drug therapy: Secondary | ICD-10-CM | POA: Diagnosis not present

## 2019-09-30 DIAGNOSIS — M545 Low back pain: Secondary | ICD-10-CM | POA: Diagnosis not present

## 2019-09-30 DIAGNOSIS — M25569 Pain in unspecified knee: Secondary | ICD-10-CM | POA: Diagnosis not present

## 2019-10-05 DIAGNOSIS — I10 Essential (primary) hypertension: Secondary | ICD-10-CM | POA: Diagnosis not present

## 2019-10-05 DIAGNOSIS — H409 Unspecified glaucoma: Secondary | ICD-10-CM | POA: Diagnosis not present

## 2019-10-05 DIAGNOSIS — E785 Hyperlipidemia, unspecified: Secondary | ICD-10-CM | POA: Diagnosis not present

## 2019-10-05 DIAGNOSIS — K5090 Crohn's disease, unspecified, without complications: Secondary | ICD-10-CM | POA: Diagnosis not present

## 2019-10-05 DIAGNOSIS — Z008 Encounter for other general examination: Secondary | ICD-10-CM | POA: Diagnosis not present

## 2019-10-05 DIAGNOSIS — M1990 Unspecified osteoarthritis, unspecified site: Secondary | ICD-10-CM | POA: Diagnosis not present

## 2019-10-05 DIAGNOSIS — Z87891 Personal history of nicotine dependence: Secondary | ICD-10-CM | POA: Diagnosis not present

## 2019-10-11 DIAGNOSIS — Z96651 Presence of right artificial knee joint: Secondary | ICD-10-CM | POA: Diagnosis not present

## 2019-10-21 DIAGNOSIS — G8929 Other chronic pain: Secondary | ICD-10-CM | POA: Diagnosis not present

## 2019-10-21 DIAGNOSIS — M25561 Pain in right knee: Secondary | ICD-10-CM | POA: Diagnosis not present

## 2019-10-21 DIAGNOSIS — Z79899 Other long term (current) drug therapy: Secondary | ICD-10-CM | POA: Diagnosis not present

## 2019-10-21 DIAGNOSIS — G894 Chronic pain syndrome: Secondary | ICD-10-CM | POA: Diagnosis not present

## 2019-10-21 DIAGNOSIS — M545 Low back pain: Secondary | ICD-10-CM | POA: Diagnosis not present

## 2019-10-21 DIAGNOSIS — Z5181 Encounter for therapeutic drug level monitoring: Secondary | ICD-10-CM | POA: Diagnosis not present

## 2019-10-21 DIAGNOSIS — M25569 Pain in unspecified knee: Secondary | ICD-10-CM | POA: Diagnosis not present

## 2019-10-21 DIAGNOSIS — Z79891 Long term (current) use of opiate analgesic: Secondary | ICD-10-CM | POA: Diagnosis not present

## 2019-10-27 DIAGNOSIS — M25561 Pain in right knee: Secondary | ICD-10-CM | POA: Diagnosis not present

## 2019-11-03 DIAGNOSIS — M25561 Pain in right knee: Secondary | ICD-10-CM | POA: Diagnosis not present

## 2019-11-03 DIAGNOSIS — M1711 Unilateral primary osteoarthritis, right knee: Secondary | ICD-10-CM | POA: Diagnosis not present

## 2019-11-15 ENCOUNTER — Other Ambulatory Visit: Payer: Self-pay | Admitting: Internal Medicine

## 2019-11-17 ENCOUNTER — Other Ambulatory Visit: Payer: Self-pay | Admitting: Internal Medicine

## 2019-11-25 DIAGNOSIS — M25569 Pain in unspecified knee: Secondary | ICD-10-CM | POA: Diagnosis not present

## 2019-11-25 DIAGNOSIS — M25561 Pain in right knee: Secondary | ICD-10-CM | POA: Diagnosis not present

## 2019-11-25 DIAGNOSIS — Z79891 Long term (current) use of opiate analgesic: Secondary | ICD-10-CM | POA: Diagnosis not present

## 2019-11-25 DIAGNOSIS — Z79899 Other long term (current) drug therapy: Secondary | ICD-10-CM | POA: Diagnosis not present

## 2019-11-25 DIAGNOSIS — M545 Low back pain: Secondary | ICD-10-CM | POA: Diagnosis not present

## 2019-11-25 DIAGNOSIS — G894 Chronic pain syndrome: Secondary | ICD-10-CM | POA: Diagnosis not present

## 2019-11-25 DIAGNOSIS — M179 Osteoarthritis of knee, unspecified: Secondary | ICD-10-CM | POA: Diagnosis not present

## 2019-11-25 DIAGNOSIS — G8929 Other chronic pain: Secondary | ICD-10-CM | POA: Diagnosis not present

## 2019-12-01 DIAGNOSIS — M25561 Pain in right knee: Secondary | ICD-10-CM | POA: Diagnosis not present

## 2019-12-09 DIAGNOSIS — H401112 Primary open-angle glaucoma, right eye, moderate stage: Secondary | ICD-10-CM | POA: Diagnosis not present

## 2019-12-23 DIAGNOSIS — Z96651 Presence of right artificial knee joint: Secondary | ICD-10-CM | POA: Diagnosis not present

## 2019-12-23 DIAGNOSIS — M25561 Pain in right knee: Secondary | ICD-10-CM | POA: Diagnosis not present

## 2019-12-29 DIAGNOSIS — M25561 Pain in right knee: Secondary | ICD-10-CM | POA: Diagnosis not present

## 2019-12-29 DIAGNOSIS — Z96649 Presence of unspecified artificial hip joint: Secondary | ICD-10-CM | POA: Diagnosis not present

## 2020-01-11 DIAGNOSIS — Z96651 Presence of right artificial knee joint: Secondary | ICD-10-CM | POA: Diagnosis not present

## 2020-01-11 DIAGNOSIS — Z96659 Presence of unspecified artificial knee joint: Secondary | ICD-10-CM | POA: Diagnosis not present

## 2020-02-18 ENCOUNTER — Other Ambulatory Visit: Payer: Self-pay | Admitting: Internal Medicine

## 2020-03-14 ENCOUNTER — Telehealth: Payer: Self-pay | Admitting: Internal Medicine

## 2020-03-14 NOTE — Telephone Encounter (Signed)
Pt states she has had rash on right hand since and also a issues with her shoulder. Appointment was scheduled 02/03/20. She thinks she needs an x-ray. She states she has no other symptoms and wants to be seen in office.

## 2020-03-14 NOTE — Telephone Encounter (Signed)
Spoke with patient and confirmed she has had rash since June. Main complaint is her shoulder pain. She has been in the office with her husband recently. Confirmed no other acute symptoms.Pt ok to be seen in the office.

## 2020-03-16 ENCOUNTER — Encounter: Payer: Self-pay | Admitting: Internal Medicine

## 2020-03-16 ENCOUNTER — Other Ambulatory Visit: Payer: Self-pay

## 2020-03-16 ENCOUNTER — Ambulatory Visit (INDEPENDENT_AMBULATORY_CARE_PROVIDER_SITE_OTHER): Payer: Medicare HMO | Admitting: Internal Medicine

## 2020-03-16 ENCOUNTER — Ambulatory Visit
Admission: RE | Admit: 2020-03-16 | Discharge: 2020-03-16 | Disposition: A | Discharge status: Home / Self Care | Payer: Medicare HMO | Source: Ambulatory Visit | Attending: Internal Medicine | Admitting: Internal Medicine

## 2020-03-16 ENCOUNTER — Ambulatory Visit
Admission: RE | Admit: 2020-03-16 | Discharge: 2020-03-16 | Disposition: A | Discharge status: Home / Self Care | Payer: Medicare HMO | Attending: Internal Medicine | Admitting: Internal Medicine

## 2020-03-16 DIAGNOSIS — M25561 Pain in right knee: Secondary | ICD-10-CM | POA: Diagnosis not present

## 2020-03-16 DIAGNOSIS — E7800 Pure hypercholesterolemia, unspecified: Secondary | ICD-10-CM

## 2020-03-16 DIAGNOSIS — L989 Disorder of the skin and subcutaneous tissue, unspecified: Secondary | ICD-10-CM | POA: Diagnosis not present

## 2020-03-16 DIAGNOSIS — I10 Essential (primary) hypertension: Secondary | ICD-10-CM | POA: Diagnosis not present

## 2020-03-16 DIAGNOSIS — E538 Deficiency of other specified B group vitamins: Secondary | ICD-10-CM | POA: Diagnosis not present

## 2020-03-16 DIAGNOSIS — M25511 Pain in right shoulder: Secondary | ICD-10-CM

## 2020-03-16 DIAGNOSIS — M25512 Pain in left shoulder: Secondary | ICD-10-CM | POA: Insufficient documentation

## 2020-03-16 DIAGNOSIS — K5010 Crohn's disease of large intestine without complications: Secondary | ICD-10-CM

## 2020-03-16 LAB — LIPID PANEL
Cholesterol: 160 mg/dL (ref 0–200)
HDL: 78.1 mg/dL (ref >39.00)
LDL Cholesterol: 66 mg/dL (ref 0–99)
NonHDL: 81.54
Total CHOL/HDL Ratio: 2
Triglycerides: 80 mg/dL (ref 0.0–149.0)
VLDL: 16 mg/dL (ref 0.0–40.0)

## 2020-03-16 LAB — HEPATIC FUNCTION PANEL
ALT: 20 U/L (ref 0–35)
AST: 27 U/L (ref 0–37)
Albumin: 4.4 g/dL (ref 3.5–5.2)
Alkaline Phosphatase: 62 U/L (ref 39–117)
Bilirubin, Direct: 0.1 mg/dL (ref 0.0–0.3)
Total Bilirubin: 0.4 mg/dL (ref 0.2–1.2)
Total Protein: 6.8 g/dL (ref 6.0–8.3)

## 2020-03-16 LAB — BASIC METABOLIC PANEL
BUN: 14 mg/dL (ref 6–23)
CO2: 30 mEq/L (ref 19–32)
Calcium: 9.4 mg/dL (ref 8.4–10.5)
Chloride: 107 mEq/L (ref 96–112)
Creatinine, Ser: 0.72 mg/dL (ref 0.40–1.20)
GFR: 95.73 mL/min (ref >60.00)
Glucose, Bld: 84 mg/dL (ref 70–99)
Potassium: 4 mEq/L (ref 3.5–5.1)
Sodium: 141 mEq/L (ref 135–145)

## 2020-03-16 LAB — VITAMIN B12: Vitamin B-12: 154 pg/mL — ABNORMAL LOW (ref 211–911)

## 2020-03-16 MED ORDER — CLOTRIMAZOLE-BETAMETHASONE 1-0.05 % EX CREA: 1.0000 "application " | TOPICAL_CREAM | Freq: Two times a day (BID) | CUTANEOUS | 0 refills | Status: DC

## 2020-03-16 NOTE — Progress Notes (Signed)
Patient ID: Ellen Drake, female   DOB: Nov 28, 1945, 74 y.o.   MRN: [id number]   Subjective:    Patient ID: Ellen Drake, female    DOB: Mar 04, 1946, 74 y.o.   MRN: [id number]  HPI This visit occurred during the SARS-CoV-2 public health emergency.  Safety protocols were in place, including screening questions prior to the visit, additional usage of staff PPE, and extensive cleaning of exam room while observing appropriate contact time as indicated for disinfecting solutions.  Patient here as a work in appt with concerns regarding persistent right shoulder pain and rash.  She reports shoulder pain over the last 2 months.  No known injury or trauma.  Increased pain with rasing her arm.  Also pain with lying on her shoulder.  She has also noticed a rash - hand and then lesions on arms and legs.  No known exposures.  No fever.  No cough or congestion.  No abdominal pain.  Bowels moving.    Past Medical History:  Diagnosis Date  . Anemia    reslved  . Colon polyp 2009  . Crohn's disease ([place])   . Diverticulosis   . Fibrocystic breast disease   . Herniated nucleus pulposus of lumbosacral region   . History of chicken pox   . Hypercholesterolemia   . Hyperlipidemia   . Hypertension   . Osteoarthritis   . Pulmonary nodule seen on imaging study 2004   resolved 2007   Past Surgical History:  Procedure Laterality Date  . anterior cervical discectomy and fusion  1998   Dr Rolin Barry  . BACK SURGERY  1992   ruptured disc (Dr Mauri Pole)  . BREAST BIOPSY Left    neg-no scar seen  . CATARACT EXTRACTION W/PHACO Right 02/09/2019   Procedure: CATARACT EXTRACTION PHACO AND INTRAOCULAR LENS PLACEMENT ([place]) [place]  RIGHT HEALON 5, VISION BLUE;  Surgeon: Leandrew Koyanagi, MD;  Location: [place];  Service: Ophthalmology;  Laterality: Right;  . COLECTOMY  2003  . COLON SURGERY  1998  . COLONOSCOPY  2009  . COLONOSCOPY W/ BIOPSIES  11/30/2012   Procedure: COLONOSCOPY  W/BIOPSY; Surgeon: Colvin Caroli, MD; Location: [place]; Service: Gastroenterology;;  . COLONOSCOPY W/ BIOPSIES  04/15/2016   Procedure: Colonoscopy ; Surgeon: Colvin Caroli, MD; Location: [place]; Service: Gastroenterology; Laterality: N/A;  . KNEE ARTHROSCOPY W/ SYNOVECTOMY Right 03/10/2014   limited  . KNEE SURGERY Right 1/16   [facility] (Dr. Sherlynn Carbon)  . POSTERIOR LAMINECTOMY / DECOMPRESSION LUMBAR SPINE  1999  . REPLACEMENT TOTAL KNEE    . ROTATOR CUFF REPAIR Left 2007   acute open; accident   Family History  Problem Relation Age of Onset  . CVA Mother   . Hypertension Mother   . Hypertension Sister        brain tumor  . Breast cancer Neg Hx   . Colon cancer Neg Hx    Social History   Socioeconomic History  . Marital status: Married    Spouse name: Not on file  . Number of children: 0  . Years of education: 27  . Highest education level: Not on file  Occupational History  . Not on file  Tobacco Use  . Smoking status: Former Smoker    Quit date: 09/10/2009    Years since quitting: 10.5  . Smokeless tobacco: Never Used  Vaping Use  . Vaping Use: Never used  Substance and Sexual Activity  . Alcohol use: Yes  Alcohol/week: 1.0 standard drink    Types: 1 Glasses of wine per week    Comment: wine occasional; socially  . Drug use: No  . Sexual activity: Yes  Other Topics Concern  . Not on file  Social History Narrative  . Not on file   Social Determinants of Health   Financial Resource Strain: Low Risk   . Difficulty of Paying Living Expenses: Not hard at all  Food Insecurity: No Food Insecurity  . Worried About Charity fundraiser in the Last Year: Never true  . Ran Out of Food in the Last Year: Never true  Transportation Needs: No Transportation Needs  . Lack of Transportation (Medical): No  . Lack of Transportation (Non-Medical): No  Physical Activity: Unknown  . Days of Exercise per Week: 0 days  . Minutes of  Exercise per Session: Not on file  Stress: No Stress Concern Present  . Feeling of Stress : Not at all  Social Connections:   . Frequency of Communication with Friends and Family:   . Frequency of Social Gatherings with Friends and Family:   . Attends Religious Services:   . Active Member of Clubs or Organizations:   . Attends Archivist Meetings:   Marland Kitchen Marital Status:     Outpatient Encounter Medications as of 03/16/2020  Medication Sig  . amLODipine (NORVASC) 10 MG tablet TAKE 1 TABLET BY MOUTH EVERY DAY  . ASPIRIN 81 PO Take by mouth daily.  Marland Kitchen atenolol (TENORMIN) 50 MG tablet TAKE 1 TABLET BY MOUTH EVERY DAY  . brimonidine (ALPHAGAN) 0.2 % ophthalmic solution 1 drop 2 (two) times daily.   . clotrimazole-betamethasone (LOTRISONE) cream Apply 1 application topically 2 (two) times daily.  Marland Kitchen HYDROcodone-acetaminophen (NORCO/VICODIN) 5-325 MG tablet Take 1 tablet by mouth every 6 (six) hours as needed for moderate pain.  Marland Kitchen HYDROmorphone (DILAUDID) 4 MG tablet TAKE 1 EVERY 6 HOURS FOR PAIN MAY FILL ON 05/04/19  . latanoprost (XALATAN) 0.005 % ophthalmic solution Place 1 drop into both eyes at bedtime.   Marland Kitchen lisinopril (ZESTRIL) 40 MG tablet TAKE 1 TABLET BY MOUTH EVERY DAY  . nystatin cream (MYCOSTATIN) Apply 1 application topically 2 (two) times daily.  . rosuvastatin (CRESTOR) 10 MG tablet TAKE 1 TABLET BY MOUTH EVERY DAY  . sulfaSALAzine (AZULFIDINE) 500 MG tablet Take 1,500 mg by mouth 2 (two) times daily. 3 tabs  . timolol (TIMOPTIC) 0.5 % ophthalmic solution Place 1 drop into both eyes 2 (two) times daily.   No facility-administered encounter medications on file as of 03/16/2020.    Review of Systems  Constitutional: Negative for appetite change and unexpected weight change.  HENT: Negative for congestion and sinus pressure.   Respiratory: Negative for cough, chest tightness and shortness of breath.   Cardiovascular: Negative for chest pain, palpitations and leg swelling.    Gastrointestinal: Negative for abdominal pain, diarrhea, nausea and vomiting.  Genitourinary: Negative for difficulty urinating and dysuria.  Musculoskeletal: Negative for joint swelling and myalgias.  Skin: Negative for color change.       Skin lesions as outlined.    Neurological: Negative for dizziness, light-headedness and headaches.  Psychiatric/Behavioral: Negative for agitation and dysphoric mood.       Objective:    Physical Exam Vitals reviewed.  Constitutional:      General: She is not in acute distress.    Appearance: Normal appearance.  HENT:     Head: Normocephalic and atraumatic.     Right Ear: External ear  normal.     Left Ear: External ear normal.  Eyes:     General: No scleral icterus.       Right eye: No discharge.        Left eye: No discharge.     Conjunctiva/sclera: Conjunctivae normal.  Neck:     Thyroid: No thyromegaly.  Cardiovascular:     Rate and Rhythm: Normal rate and regular rhythm.  Pulmonary:     Effort: No respiratory distress.     Breath sounds: Normal breath sounds. No wheezing.  Abdominal:     General: Bowel sounds are normal.     Palpations: Abdomen is soft.     Tenderness: There is no abdominal tenderness.  Musculoskeletal:        General: No swelling or tenderness.     Cervical back: Neck supple. No tenderness.  Lymphadenopathy:     Cervical: No cervical adenopathy.  Skin:    Comments: Circular lesions - hand, arm and legs.  Scaling.    Neurological:     Mental Status: She is alert.  Psychiatric:        Mood and Affect: Mood normal.        Behavior: Behavior normal.     BP 120/78   Pulse (!) 57   Temp 98.2 F (36.8 C) (Oral)   Resp 16   Ht 5' 6"  (1.676 m)   Wt 150 lb 6.4 oz (68.2 kg)   LMP 08/13/1995   SpO2 98%   BMI 24.28 kg/m  Wt Readings from Last 3 Encounters:  03/16/20 150 lb 6.4 oz (68.2 kg)  07/22/19 150 lb (68 kg)  05/18/19 152 lb (68.9 kg)     Lab Results  Component Value Date   WBC 8.4 05/18/2019    HGB 12.1 05/18/2019   HCT 36.7 05/18/2019   PLT 190.0 05/18/2019   GLUCOSE 84 03/16/2020   CHOL 160 03/16/2020   TRIG 80.0 03/16/2020   HDL 78.10 03/16/2020   LDLDIRECT 132.5 05/06/2013   LDLCALC 66 03/16/2020   ALT 20 03/16/2020   AST 27 03/16/2020   NA 141 03/16/2020   K 4.0 03/16/2020   CL 107 03/16/2020   CREATININE 0.72 03/16/2020   BUN 14 03/16/2020   CO2 30 03/16/2020   TSH 1.57 03/17/2019   HGBA1C 5.7 10/19/2012    MM 3D SCREEN BREAST BILATERAL  Result Date: 09/08/2019 CLINICAL DATA:  Screening. EXAM: DIGITAL SCREENING BILATERAL MAMMOGRAM WITH TOMO AND CAD COMPARISON:  Previous exam(s). ACR Breast Density Category b: There are scattered areas of fibroglandular density. FINDINGS: There are no findings suspicious for malignancy. Images were processed with CAD. IMPRESSION: No mammographic evidence of malignancy. A result letter of this screening mammogram will be mailed directly to the patient. RECOMMENDATION: Screening mammogram in one year. (Code:SM-B-01Y) BI-RADS CATEGORY  1: Negative. Electronically Signed   By: Margarette Canada M.D.   On: 09/08/2019 09:03       Assessment & Plan:   Problem List Items Addressed This Visit    B12 deficiency   Crohn's disease ([place])    Followed by Dr Evalee Mutton.  Stable.       Hypercholesterolemia    On crestor.  Low cholesterol diet and exercise.  Follow lipid panel and liver function tests.        Hypertension    Blood pressure as outlined. Continue lisinopril, atenolol and amlodipine.  Follow pressures.  Follow metabolic panel.       Knee pain    Persistent knee  pain.  Has seen ortho and had multiple procedures/surgeries.  Continue follow up with ortho.       Right shoulder pain    Persistent shoulder pain as outlined.  Check xray.  Increased pain with abduction especially at or above 90 degrees.  Consider ortho referral.       Relevant Orders   DG Shoulder Right (Completed)   Skin lesion    Multiple skin lesions.  Trial  of lotrisone cream.  Have dermatology evaluate.        Relevant Orders   Ambulatory referral to Dermatology       Einar Pheasant, MD

## 2020-03-17 ENCOUNTER — Other Ambulatory Visit: Payer: Self-pay | Admitting: Internal Medicine

## 2020-03-17 ENCOUNTER — Encounter: Payer: Self-pay | Admitting: Internal Medicine

## 2020-03-17 DIAGNOSIS — M25511 Pain in right shoulder: Secondary | ICD-10-CM

## 2020-03-17 DIAGNOSIS — L989 Disorder of the skin and subcutaneous tissue, unspecified: Secondary | ICD-10-CM | POA: Insufficient documentation

## 2020-03-17 NOTE — Assessment & Plan Note (Signed)
Blood pressure as outlined. Continue lisinopril, atenolol and amlodipine.  Follow pressures.  Follow metabolic panel.

## 2020-03-17 NOTE — Assessment & Plan Note (Signed)
Followed by Dr Evalee Mutton.  Stable.

## 2020-03-17 NOTE — Assessment & Plan Note (Signed)
Persistent knee pain.  Has seen ortho and had multiple procedures/surgeries.  Continue follow up with ortho.

## 2020-03-17 NOTE — Assessment & Plan Note (Signed)
Multiple skin lesions.  Trial of lotrisone cream.  Have dermatology evaluate.

## 2020-03-17 NOTE — Assessment & Plan Note (Signed)
On crestor.  Low cholesterol diet and exercise.  Follow lipid panel and liver function tests.

## 2020-03-17 NOTE — Progress Notes (Signed)
Order placed for ortho referral.

## 2020-03-17 NOTE — Assessment & Plan Note (Signed)
Persistent shoulder pain as outlined.  Check xray.  Increased pain with abduction especially at or above 90 degrees.  Consider ortho referral.

## 2020-03-29 ENCOUNTER — Other Ambulatory Visit: Payer: Self-pay

## 2020-03-29 ENCOUNTER — Ambulatory Visit (INDEPENDENT_AMBULATORY_CARE_PROVIDER_SITE_OTHER): Payer: Medicare HMO

## 2020-03-29 DIAGNOSIS — E538 Deficiency of other specified B group vitamins: Secondary | ICD-10-CM

## 2020-03-29 MED ORDER — CYANOCOBALAMIN 1000 MCG/ML IJ SOLN
1000.0000 ug | Freq: Once | INTRAMUSCULAR | Status: AC
  Administered 2020-03-29: 1000 ug via INTRAMUSCULAR

## 2020-03-29 NOTE — Progress Notes (Addendum)
Patient presented for B 12 injection to left deltoid, patient voiced no concerns nor showed any signs of distress during injection.  Reviewed.  Dr Scott 

## 2020-03-30 DIAGNOSIS — M7541 Impingement syndrome of right shoulder: Secondary | ICD-10-CM | POA: Diagnosis not present

## 2020-04-03 ENCOUNTER — Other Ambulatory Visit: Payer: Self-pay | Admitting: Internal Medicine

## 2020-04-05 ENCOUNTER — Ambulatory Visit (INDEPENDENT_AMBULATORY_CARE_PROVIDER_SITE_OTHER): Payer: Medicare HMO

## 2020-04-05 ENCOUNTER — Other Ambulatory Visit: Payer: Self-pay

## 2020-04-05 ENCOUNTER — Ambulatory Visit: Payer: Medicare HMO

## 2020-04-05 DIAGNOSIS — E538 Deficiency of other specified B group vitamins: Secondary | ICD-10-CM

## 2020-04-05 MED ORDER — CYANOCOBALAMIN 1000 MCG/ML IJ SOLN
1000.0000 ug | Freq: Once | INTRAMUSCULAR | Status: AC
  Administered 2020-04-05: 1000 ug via INTRAMUSCULAR

## 2020-04-05 NOTE — Progress Notes (Addendum)
Patient presented for B 12 injection to left deltoid, patient voiced no concerns nor showed any signs of distress during injection.  Reviewed.  Dr Scott 

## 2020-04-12 ENCOUNTER — Other Ambulatory Visit: Payer: Self-pay

## 2020-04-12 ENCOUNTER — Ambulatory Visit: Payer: Medicare HMO

## 2020-04-12 ENCOUNTER — Ambulatory Visit (INDEPENDENT_AMBULATORY_CARE_PROVIDER_SITE_OTHER): Payer: Medicare HMO

## 2020-04-12 DIAGNOSIS — E538 Deficiency of other specified B group vitamins: Secondary | ICD-10-CM

## 2020-04-12 MED ORDER — CYANOCOBALAMIN 1000 MCG/ML IJ SOLN
1000.0000 ug | Freq: Once | INTRAMUSCULAR | Status: AC
  Administered 2020-04-12: 1000 ug via INTRAMUSCULAR

## 2020-04-12 NOTE — Progress Notes (Addendum)
Patient presented for B 12 injection to right deltoid, patient voiced no concerns nor showed any signs of distress during injection.  Reviewed.  Dr Nicki Reaper

## 2020-04-19 ENCOUNTER — Ambulatory Visit (INDEPENDENT_AMBULATORY_CARE_PROVIDER_SITE_OTHER): Payer: Medicare HMO

## 2020-04-19 ENCOUNTER — Other Ambulatory Visit: Payer: Self-pay

## 2020-04-19 ENCOUNTER — Ambulatory Visit: Payer: Medicare HMO

## 2020-04-19 DIAGNOSIS — E538 Deficiency of other specified B group vitamins: Secondary | ICD-10-CM

## 2020-04-19 MED ORDER — CYANOCOBALAMIN 1000 MCG/ML IJ SOLN
1000.0000 ug | Freq: Once | INTRAMUSCULAR | Status: AC
  Administered 2020-04-19: 1000 ug via INTRAMUSCULAR

## 2020-04-19 NOTE — Progress Notes (Addendum)
Patient presented for B 12 injection to left deltoid, patient voiced no concerns nor showed any signs of distress during injection.  Reviewed.  Dr Scott 

## 2020-05-14 DIAGNOSIS — Z79891 Long term (current) use of opiate analgesic: Secondary | ICD-10-CM | POA: Diagnosis not present

## 2020-05-14 DIAGNOSIS — M25569 Pain in unspecified knee: Secondary | ICD-10-CM | POA: Diagnosis not present

## 2020-05-14 DIAGNOSIS — M179 Osteoarthritis of knee, unspecified: Secondary | ICD-10-CM | POA: Diagnosis not present

## 2020-05-14 DIAGNOSIS — G8929 Other chronic pain: Secondary | ICD-10-CM | POA: Diagnosis not present

## 2020-05-14 DIAGNOSIS — Z79899 Other long term (current) drug therapy: Secondary | ICD-10-CM | POA: Diagnosis not present

## 2020-05-14 DIAGNOSIS — G894 Chronic pain syndrome: Secondary | ICD-10-CM | POA: Diagnosis not present

## 2020-05-14 DIAGNOSIS — M25561 Pain in right knee: Secondary | ICD-10-CM | POA: Diagnosis not present

## 2020-05-18 ENCOUNTER — Other Ambulatory Visit: Payer: Self-pay | Admitting: Internal Medicine

## 2020-05-22 ENCOUNTER — Ambulatory Visit (INDEPENDENT_AMBULATORY_CARE_PROVIDER_SITE_OTHER): Payer: Medicare HMO

## 2020-05-22 ENCOUNTER — Other Ambulatory Visit: Payer: Self-pay

## 2020-05-22 DIAGNOSIS — E538 Deficiency of other specified B group vitamins: Secondary | ICD-10-CM | POA: Diagnosis not present

## 2020-05-22 MED ORDER — CYANOCOBALAMIN 1000 MCG/ML IJ SOLN
1000.0000 ug | Freq: Once | INTRAMUSCULAR | Status: AC
  Administered 2020-05-22: 1000 ug via INTRAMUSCULAR

## 2020-05-22 NOTE — Progress Notes (Addendum)
Patient presented for B 12 injection to right deltoid, patient voiced no concerns nor showed any signs of distress during injection.  Reviewed.  Dr Nicki Reaper

## 2020-05-23 DIAGNOSIS — Z5181 Encounter for therapeutic drug level monitoring: Secondary | ICD-10-CM | POA: Diagnosis not present

## 2020-05-23 DIAGNOSIS — K5010 Crohn's disease of large intestine without complications: Secondary | ICD-10-CM | POA: Diagnosis not present

## 2020-05-23 DIAGNOSIS — E538 Deficiency of other specified B group vitamins: Secondary | ICD-10-CM | POA: Diagnosis not present

## 2020-05-23 DIAGNOSIS — Z86010 Personal history of colonic polyps: Secondary | ICD-10-CM | POA: Diagnosis not present

## 2020-05-23 DIAGNOSIS — Z9049 Acquired absence of other specified parts of digestive tract: Secondary | ICD-10-CM | POA: Diagnosis not present

## 2020-06-11 DIAGNOSIS — H401123 Primary open-angle glaucoma, left eye, severe stage: Secondary | ICD-10-CM | POA: Diagnosis not present

## 2020-06-19 ENCOUNTER — Telehealth: Payer: Self-pay | Admitting: Internal Medicine

## 2020-06-19 NOTE — Telephone Encounter (Signed)
Rejection Reason - Patient did not respond" [facility] PA said on Jun 19, 2020 11:30 AM  "Left message for patient to call back to schedule 03/19/20" [facility] PA said on Mar 26, 2020 3:52 PM

## 2020-06-20 ENCOUNTER — Other Ambulatory Visit: Payer: Self-pay

## 2020-06-20 ENCOUNTER — Encounter: Payer: Self-pay | Admitting: Internal Medicine

## 2020-06-20 ENCOUNTER — Ambulatory Visit (INDEPENDENT_AMBULATORY_CARE_PROVIDER_SITE_OTHER): Payer: Medicare HMO | Admitting: Internal Medicine

## 2020-06-20 DIAGNOSIS — I10 Essential (primary) hypertension: Secondary | ICD-10-CM

## 2020-06-20 DIAGNOSIS — E7800 Pure hypercholesterolemia, unspecified: Secondary | ICD-10-CM

## 2020-06-20 DIAGNOSIS — M25561 Pain in right knee: Secondary | ICD-10-CM

## 2020-06-20 DIAGNOSIS — K5010 Crohn's disease of large intestine without complications: Secondary | ICD-10-CM

## 2020-06-20 DIAGNOSIS — E538 Deficiency of other specified B group vitamins: Secondary | ICD-10-CM | POA: Diagnosis not present

## 2020-06-20 DIAGNOSIS — Z23 Encounter for immunization: Secondary | ICD-10-CM

## 2020-06-20 DIAGNOSIS — Z0000 Encounter for general adult medical examination without abnormal findings: Secondary | ICD-10-CM

## 2020-06-20 DIAGNOSIS — M25511 Pain in right shoulder: Secondary | ICD-10-CM

## 2020-06-20 MED ORDER — CYANOCOBALAMIN 1000 MCG/ML IJ SOLN
1000.0000 ug | Freq: Once | INTRAMUSCULAR | Status: AC
  Administered 2020-06-20: 1000 ug via INTRAMUSCULAR

## 2020-06-20 NOTE — Progress Notes (Signed)
Patient ID: Ellen Drake, female   DOB: 07-31-1946, 74 y.o.   MRN: [id number]   Subjective:    Patient ID: Ellen Drake, female    DOB: 1945/12/21, 74 y.o.   MRN: [id number]  HPI This visit occurred during the SARS-CoV-2 public health emergency.  Safety protocols were in place, including screening questions prior to the visit, additional usage of staff PPE, and extensive cleaning of exam room while observing appropriate contact time as indicated for disinfecting solutions.  Patient here for her physical exam.  She reports she is doing relatively well.  Increased stress with her husband's health issues.  Overall handling things well.  S/p injection - right shoulder.  Helped.  Starting to have some return of pain.  Plans to f/u with ortho.  No chest pain or sob.  No acid reflux.  No abdominal pain.  Bowels moving.  No recent flares. Has Crohn's.  Followed by GI.  Just evaluated 05/23/20.  Stable.   Past Medical History:  Diagnosis Date  . Anemia    reslved  . Colon polyp 2009  . Crohn's disease ([place])   . Diverticulosis   . Fibrocystic breast disease   . Herniated nucleus pulposus of lumbosacral region   . History of chicken pox   . Hypercholesterolemia   . Hyperlipidemia   . Hypertension   . Osteoarthritis   . Pulmonary nodule seen on imaging study 2004   resolved 2007   Past Surgical History:  Procedure Laterality Date  . anterior cervical discectomy and fusion  1998   Dr Rolin Barry  . BACK SURGERY  1992   ruptured disc (Dr Mauri Pole)  . BREAST BIOPSY Left    neg-no scar seen  . CATARACT EXTRACTION W/PHACO Right 02/09/2019   Procedure: CATARACT EXTRACTION PHACO AND INTRAOCULAR LENS PLACEMENT ([place]) [place]  RIGHT HEALON 5, VISION BLUE;  Surgeon: Leandrew Koyanagi, MD;  Location: [place];  Service: Ophthalmology;  Laterality: Right;  . COLECTOMY  2003  . COLON SURGERY  1998  . COLONOSCOPY  2009  . COLONOSCOPY W/ BIOPSIES  11/30/2012   Procedure:  COLONOSCOPY W/BIOPSY; Surgeon: Colvin Caroli, MD; Location: [place]; Service: Gastroenterology;;  . COLONOSCOPY W/ BIOPSIES  04/15/2016   Procedure: Colonoscopy ; Surgeon: Colvin Caroli, MD; Location: [place]; Service: Gastroenterology; Laterality: N/A;  . KNEE ARTHROSCOPY W/ SYNOVECTOMY Right 03/10/2014   limited  . KNEE SURGERY Right 1/16   [facility] (Dr. Sherlynn Carbon)  . POSTERIOR LAMINECTOMY / DECOMPRESSION LUMBAR SPINE  1999  . REPLACEMENT TOTAL KNEE    . ROTATOR CUFF REPAIR Left 2007   acute open; accident   Family History  Problem Relation Age of Onset  . CVA Mother   . Hypertension Mother   . Hypertension Sister        brain tumor  . Breast cancer Neg Hx   . Colon cancer Neg Hx    Social History   Socioeconomic History  . Marital status: Married    Spouse name: Not on file  . Number of children: 0  . Years of education: 72  . Highest education level: Not on file  Occupational History  . Not on file  Tobacco Use  . Smoking status: Former Smoker    Quit date: 09/10/2009    Years since quitting: 10.7  . Smokeless tobacco: Never Used  Vaping Use  . Vaping Use: Never used  Substance and Sexual Activity  . Alcohol use: Yes  Alcohol/week: 1.0 standard drink    Types: 1 Glasses of wine per week    Comment: wine occasional; socially  . Drug use: No  . Sexual activity: Yes  Other Topics Concern  . Not on file  Social History Narrative  . Not on file   Social Determinants of Health   Financial Resource Strain: Low Risk   . Difficulty of Paying Living Expenses: Not hard at all  Food Insecurity: No Food Insecurity  . Worried About Charity fundraiser in the Last Year: Never true  . Ran Out of Food in the Last Year: Never true  Transportation Needs: No Transportation Needs  . Lack of Transportation (Medical): No  . Lack of Transportation (Non-Medical): No  Physical Activity: Unknown  . Days of Exercise per Week: 0 days  .  Minutes of Exercise per Session: Not on file  Stress: No Stress Concern Present  . Feeling of Stress : Not at all  Social Connections:   . Frequency of Communication with Friends and Family: Not on file  . Frequency of Social Gatherings with Friends and Family: Not on file  . Attends Religious Services: Not on file  . Active Member of Clubs or Organizations: Not on file  . Attends Archivist Meetings: Not on file  . Marital Status: Not on file    Outpatient Encounter Medications as of 06/20/2020  Medication Sig  . amLODipine (NORVASC) 10 MG tablet TAKE 1 TABLET BY MOUTH EVERY DAY  . ASPIRIN 81 PO Take by mouth daily.  Marland Kitchen atenolol (TENORMIN) 50 MG tablet TAKE 1 TABLET BY MOUTH EVERY DAY  . brimonidine (ALPHAGAN) 0.2 % ophthalmic solution 1 drop 2 (two) times daily.   . clotrimazole-betamethasone (LOTRISONE) cream APPLY TO AFFECTED AREA TWICE A DAY  . gabapentin (NEURONTIN) 100 MG capsule Take 100 mg by mouth 3 (three) times daily.  Marland Kitchen HYDROcodone-acetaminophen (NORCO/VICODIN) 5-325 MG tablet Take 1 tablet by mouth every 6 (six) hours as needed for moderate pain.  Marland Kitchen HYDROmorphone (DILAUDID) 4 MG tablet TAKE 1 EVERY 6 HOURS FOR PAIN MAY FILL ON 05/04/19  . latanoprost (XALATAN) 0.005 % ophthalmic solution Place 1 drop into both eyes at bedtime.   Marland Kitchen lisinopril (ZESTRIL) 40 MG tablet TAKE 1 TABLET BY MOUTH EVERY DAY  . nystatin cream (MYCOSTATIN) Apply 1 application topically 2 (two) times daily.  . rosuvastatin (CRESTOR) 10 MG tablet TAKE 1 TABLET BY MOUTH EVERY DAY  . sulfaSALAzine (AZULFIDINE) 500 MG tablet Take 1,500 mg by mouth 2 (two) times daily. 3 tabs  . timolol (TIMOPTIC) 0.5 % ophthalmic solution Place 1 drop into both eyes 2 (two) times daily.  . traMADol (ULTRAM) 50 MG tablet Take 50 mg by mouth every 4 (four) hours as needed.  . [EXPIRED] cyanocobalamin ((VITAMIN B-12)) injection 1,000 mcg    No facility-administered encounter medications on file as of 06/20/2020.     Review of Systems  Constitutional: Negative for appetite change and unexpected weight change.  HENT: Negative for congestion, sinus pressure and sore throat.   Eyes: Negative for pain and visual disturbance.  Respiratory: Negative for cough, chest tightness and shortness of breath.   Cardiovascular: Negative for chest pain, palpitations and leg swelling.  Gastrointestinal: Negative for abdominal pain, constipation, diarrhea and vomiting.  Genitourinary: Negative for difficulty urinating and dysuria.  Musculoskeletal: Negative for joint swelling and myalgias.       Right shoulder pain as outlined.  Persistent knee pain.  Better on current regimen.  Skin: Negative for color change and rash.  Neurological: Negative for dizziness, light-headedness and headaches.  Hematological: Negative for adenopathy. Does not bruise/bleed easily.  Psychiatric/Behavioral: Negative for agitation and dysphoric mood.       Objective:    Physical Exam Vitals reviewed.  Constitutional:      General: She is not in acute distress.    Appearance: Normal appearance. She is well-developed.  HENT:     Head: Normocephalic and atraumatic.     Right Ear: External ear normal.     Left Ear: External ear normal.  Eyes:     General: No scleral icterus.       Right eye: No discharge.        Left eye: No discharge.     Conjunctiva/sclera: Conjunctivae normal.  Neck:     Thyroid: No thyromegaly.  Cardiovascular:     Rate and Rhythm: Normal rate and regular rhythm.  Pulmonary:     Effort: No tachypnea, accessory muscle usage or respiratory distress.     Breath sounds: Normal breath sounds. No decreased breath sounds or wheezing.  Chest:     Breasts:        Right: No inverted nipple, mass, nipple discharge or tenderness (no axillary adenopathy).        Left: No inverted nipple, mass, nipple discharge or tenderness (no axilarry adenopathy).  Abdominal:     General: Bowel sounds are normal.     Palpations:  Abdomen is soft.     Tenderness: There is no abdominal tenderness.  Musculoskeletal:        General: No swelling or tenderness.     Cervical back: Neck supple. No tenderness.  Lymphadenopathy:     Cervical: No cervical adenopathy.  Skin:    Findings: No erythema or rash.  Neurological:     Mental Status: She is alert and oriented to person, place, and time.  Psychiatric:        Mood and Affect: Mood normal.        Behavior: Behavior normal.     BP 130/78   Pulse (!) 59   Temp 97.9 F (36.6 C) (Oral)   Resp 16   Ht 5' 6"  (1.676 m)   Wt 150 lb 6.4 oz (68.2 kg)   LMP 08/13/1995   SpO2 98%   BMI 24.28 kg/m  Wt Readings from Last 3 Encounters:  06/20/20 150 lb 6.4 oz (68.2 kg)  03/16/20 150 lb 6.4 oz (68.2 kg)  07/22/19 150 lb (68 kg)     Lab Results  Component Value Date   WBC 8.4 05/18/2019   HGB 12.1 05/18/2019   HCT 36.7 05/18/2019   PLT 190.0 05/18/2019   GLUCOSE 84 03/16/2020   CHOL 160 03/16/2020   TRIG 80.0 03/16/2020   HDL 78.10 03/16/2020   LDLDIRECT 132.5 05/06/2013   LDLCALC 66 03/16/2020   ALT 20 03/16/2020   AST 27 03/16/2020   NA 141 03/16/2020   K 4.0 03/16/2020   CL 107 03/16/2020   CREATININE 0.72 03/16/2020   BUN 14 03/16/2020   CO2 30 03/16/2020   TSH 1.57 03/17/2019   HGBA1C 5.7 10/19/2012    DG Shoulder Right  Result Date: 03/16/2020 CLINICAL DATA:  Persistent right shoulder pain.  No known injury. EXAM: RIGHT SHOULDER - 2+ VIEW COMPARISON:  Chest x-ray 12/26/2013. FINDINGS: Acromioclavicular and glenohumeral degenerative change. Downsloping acromion with prominent subacromial spurring. Right shoulder is high-riding suggesting chronic rotator cuff tear. No acute bony abnormality. No evidence of  fracture or dislocation. Prior cervical spine fusion degenerative change thoracic spine. IMPRESSION: Acromioclavicular and glenohumeral degenerative change. Downsloping acromion with prominent subacromial spurring. Right shoulder is high-riding  suggesting chronic rotator cuff tear. No acute abnormality identified. Electronically Signed   By: Marcello Moores  Register   On: 03/16/2020 13:22       Assessment & Plan:   Problem List Items Addressed This Visit    Right shoulder pain    Shoulder better.  Seeing ortho.        Knee pain    Doing better on current regimen.  Followed by ortho.       Hypertension    Continue lisinopril, amoldipine and atenolol.  Follow pressures.  Blood pressure as outlined.  Follow metabolic panel.        Relevant Orders   TSH   Basic metabolic panel   Hypercholesterolemia    On crestor.  Low cholesterol diet and exercise.  Follow lipid panel and liver function tests.        Relevant Orders   Hepatic function panel   Lipid panel   Health care maintenance    Physical today 06/20/20.  Colonoscopy 04/2019 - tubular adenoma.  Follow up colonoscopy in 3 years.  Mammogram 09/08/19 - Birads I.       Crohn's disease ([place])    Followed by Dr Redmond Pulling.  Stable.  Doing well on sulfasalazine.        Relevant Orders   CBC with Differential/Platelet   B12 deficiency    Receiving b12 injections.  Follow B12 level.       Relevant Orders   Vitamin B12    Other Visit Diagnoses    Need for immunization against influenza       Relevant Orders   Flu Vaccine QUAD High Dose(Fluad) (Completed)       Einar Pheasant, MD

## 2020-06-23 ENCOUNTER — Encounter: Payer: Self-pay | Admitting: Internal Medicine

## 2020-06-23 NOTE — Assessment & Plan Note (Signed)
On crestor.  Low cholesterol diet and exercise.  Follow lipid panel and liver function tests.

## 2020-06-23 NOTE — Assessment & Plan Note (Addendum)
Continue lisinopril, amoldipine and atenolol.  Follow pressures.  Blood pressure as outlined.  Follow metabolic panel.

## 2020-06-23 NOTE — Assessment & Plan Note (Addendum)
Physical today 06/20/20.  Colonoscopy 04/2019 - tubular adenoma.  Follow up colonoscopy in 3 years.  Mammogram 09/08/19 - Birads I.

## 2020-06-23 NOTE — Assessment & Plan Note (Signed)
Followed by Dr Redmond Pulling.  Stable.  Doing well on sulfasalazine.

## 2020-06-23 NOTE — Assessment & Plan Note (Addendum)
Receiving b12 injections.  Follow B12 level.

## 2020-06-23 NOTE — Assessment & Plan Note (Signed)
Shoulder better.  Seeing ortho.

## 2020-06-23 NOTE — Assessment & Plan Note (Signed)
Doing better on current regimen.  Followed by ortho.

## 2020-07-06 ENCOUNTER — Other Ambulatory Visit: Payer: Self-pay | Admitting: Internal Medicine

## 2020-07-16 ENCOUNTER — Ambulatory Visit (INDEPENDENT_AMBULATORY_CARE_PROVIDER_SITE_OTHER): Payer: Medicare HMO

## 2020-07-16 VITALS — Ht 66.0 in | Wt 150.0 lb

## 2020-07-16 DIAGNOSIS — Z0000 Encounter for general adult medical examination without abnormal findings: Secondary | ICD-10-CM

## 2020-07-16 NOTE — Patient Instructions (Addendum)
Ellen Drake , Thank you for taking time to come for your Medicare Wellness Visit. I appreciate your ongoing commitment to your health goals. Please review the following plan we discussed and let me know if I can assist you in the future.   These are the goals we discussed: Goals      Patient Stated   .  Follow up with Primary Care Provider (pt-stated)      As needed       This is a list of the screening recommended for you and due dates:  Health Maintenance  Topic Date Due  .  Hepatitis C: One time screening is recommended by Center for Disease Control  (CDC) for  adults born from [birth year] through 1965.   Never done  . Tetanus Vaccine  07/16/2021*  . Mammogram  09/06/2020  . Colon Cancer Screening  12/01/2022  . Flu Shot  Completed  . DEXA scan (bone density measurement)  Completed  . COVID-19 Vaccine  Completed  . Pneumonia vaccines  Completed  *Topic was postponed. The date shown is not the original due date.    Immunizations Immunization History  Administered Date(s) Administered  . Fluad Quad(high Dose 65+) 07/26/2019, 06/20/2020  . Influenza Split 05/13/2011, 08/12/2012, 04/29/2014  . Influenza, High Dose Seasonal PF 07/14/2016, 07/15/2017, 04/29/2018  . Influenza, Seasonal, Injecte, Preservative Fre 06/05/2014  . Influenza,inj,Quad PF,6+ Mos 05/06/2013, 06/08/2015  . PFIZER SARS-COV-2 Vaccination 09/21/2019, 10/12/2019, 06/06/2020  . Pneumococcal Conjugate-13 07/21/2016  . Pneumococcal Polysaccharide-23 09/01/2014   Keep all routine maintenance appointments.   Next scheduled lab nurse visit 07/25/20 @ 9:30  Follow up 10/18/20 @ 11:30  Advanced directives: End of life planning; Advance aging; Advanced directives discussed.  Copy of current HCPOA/Living Will requested.    Conditions/risks identified: none new.   Follow up in one year for your annual wellness visit.   Preventive Care 74 Years and Older, Female Preventive care refers to lifestyle choices and visits  with your health care provider that can promote health and wellness. What does preventive care include?  A yearly physical exam. This is also called an annual well check.  Dental exams once or twice a year.  Routine eye exams. Ask your health care provider how often you should have your eyes checked.  Personal lifestyle choices, including:  Daily care of your teeth and gums.  Regular physical activity.  Eating a healthy diet.  Avoiding tobacco and drug use.  Limiting alcohol use.  Practicing safe sex.  Taking low-dose aspirin every day.  Taking vitamin and mineral supplements as recommended by your health care provider. What happens during an annual well check? The services and screenings done by your health care provider during your annual well check will depend on your age, overall health, lifestyle risk factors, and family history of disease. Counseling  Your health care provider may ask you questions about your:  Alcohol use.  Tobacco use.  Drug use.  Emotional well-being.  Home and relationship well-being.  Sexual activity.  Eating habits.  History of falls.  Memory and ability to understand (cognition).  Work and work Statistician.  Reproductive health. Screening  You may have the following tests or measurements:  Height, weight, and BMI.  Blood pressure.  Lipid and cholesterol levels. These may be checked every 5 years, or more frequently if you are over 31 years old.  Skin check.  Lung cancer screening. You may have this screening every year starting at age 74 if you have a 30-pack-year  history of smoking and currently smoke or have quit within the past 15 years.  Fecal occult blood test (FOBT) of the stool. You may have this test every year starting at age 64.  Flexible sigmoidoscopy or colonoscopy. You may have a sigmoidoscopy every 5 years or a colonoscopy every 10 years starting at age 50.  Hepatitis C blood test.  Hepatitis B blood  test.  Sexually transmitted disease (STD) testing.  Diabetes screening. This is done by checking your blood sugar (glucose) after you have not eaten for a while (fasting). You may have this done every 1-3 years.  Bone density scan. This is done to screen for osteoporosis. You may have this done starting at age 74.  Mammogram. This may be done every 1-2 years. Talk to your health care provider about how often you should have regular mammograms. Talk with your health care provider about your test results, treatment options, and if necessary, the need for more tests. Vaccines  Your health care provider may recommend certain vaccines, such as:  Influenza vaccine. This is recommended every year.  Tetanus, diphtheria, and acellular pertussis (Tdap, Td) vaccine. You may need a Td booster every 10 years.  Zoster vaccine. You may need this after age 74.  Pneumococcal 13-valent conjugate (PCV13) vaccine. One dose is recommended after age 74.  Pneumococcal polysaccharide (PPSV23) vaccine. One dose is recommended after age 74. Talk to your health care provider about which screenings and vaccines you need and how often you need them. This information is not intended to replace advice given to you by your health care provider. Make sure you discuss any questions you have with your health care provider. Document Released: 08/24/2015 Document Revised: 04/16/2016 Document Reviewed: 05/29/2015 Elsevier Interactive Patient Education  2017 [place] Prevention in the Home Falls can cause injuries. They can happen to people of all ages. There are many things you can do to make your home safe and to help prevent falls. What can I do on the outside of my home?  Regularly fix the edges of walkways and driveways and fix any cracks.  Remove anything that might make you trip as you walk through a door, such as a raised step or threshold.  Trim any bushes or trees on the path to your home.  Use  bright outdoor lighting.  Clear any walking paths of anything that might make someone trip, such as rocks or tools.  Regularly check to see if handrails are loose or broken. Make sure that both sides of any steps have handrails.  Any raised decks and porches should have guardrails on the edges.  Have any leaves, snow, or ice cleared regularly.  Use sand or salt on walking paths during winter.  Clean up any spills in your garage right away. This includes oil or grease spills. What can I do in the bathroom?  Use night lights.  Install grab bars by the toilet and in the tub and shower. Do not use towel bars as grab bars.  Use non-skid mats or decals in the tub or shower.  If you need to sit down in the shower, use a plastic, non-slip stool.  Keep the floor dry. Clean up any water that spills on the floor as soon as it happens.  Remove soap buildup in the tub or shower regularly.  Attach bath mats securely with double-sided non-slip rug tape.  Do not have throw rugs and other things on the floor that can make you trip.  What can I do in the bedroom?  Use night lights.  Make sure that you have a light by your bed that is easy to reach.  Do not use any sheets or blankets that are too big for your bed. They should not hang down onto the floor.  Have a firm chair that has side arms. You can use this for support while you get dressed.  Do not have throw rugs and other things on the floor that can make you trip. What can I do in the kitchen?  Clean up any spills right away.  Avoid walking on wet floors.  Keep items that you use a lot in easy-to-reach places.  If you need to reach something above you, use a strong step stool that has a grab bar.  Keep electrical cords out of the way.  Do not use floor polish or wax that makes floors slippery. If you must use wax, use non-skid floor wax.  Do not have throw rugs and other things on the floor that can make you trip. What can  I do with my stairs?  Do not leave any items on the stairs.  Make sure that there are handrails on both sides of the stairs and use them. Fix handrails that are broken or loose. Make sure that handrails are as long as the stairways.  Check any carpeting to make sure that it is firmly attached to the stairs. Fix any carpet that is loose or worn.  Avoid having throw rugs at the top or bottom of the stairs. If you do have throw rugs, attach them to the floor with carpet tape.  Make sure that you have a light switch at the top of the stairs and the bottom of the stairs. If you do not have them, ask someone to add them for you. What else can I do to help prevent falls?  Wear shoes that:  Do not have high heels.  Have rubber bottoms.  Are comfortable and fit you well.  Are closed at the toe. Do not wear sandals.  If you use a stepladder:  Make sure that it is fully opened. Do not climb a closed stepladder.  Make sure that both sides of the stepladder are locked into place.  Ask someone to hold it for you, if possible.  Clearly mark and make sure that you can see:  Any grab bars or handrails.  First and last steps.  Where the edge of each step is.  Use tools that help you move around (mobility aids) if they are needed. These include:  Canes.  Walkers.  Scooters.  Crutches.  Turn on the lights when you go into a dark area. Replace any light bulbs as soon as they burn out.  Set up your furniture so you have a clear path. Avoid moving your furniture around.  If any of your floors are uneven, fix them.  If there are any pets around you, be aware of where they are.  Review your medicines with your doctor. Some medicines can make you feel dizzy. This can increase your chance of falling. Ask your doctor what other things that you can do to help prevent falls. This information is not intended to replace advice given to you by your health care provider. Make sure you  discuss any questions you have with your health care provider. Document Released: 05/24/2009 Document Revised: 01/03/2016 Document Reviewed: 09/01/2014 Elsevier Interactive Patient Education  2017 [employer].

## 2020-07-16 NOTE — Progress Notes (Addendum)
Subjective:   Ellen Drake is a 74 y.o. female who presents for Medicare Annual (Subsequent) preventive examination.  Review of Systems    No ROS.  Medicare Wellness Virtual Visit.    Cardiac Risk Factors include: advanced age (>24mn, >>68women)     Objective:    Today's Vitals   07/16/20 1107  Weight: 150 lb (68 kg)  Height: 5' 6"  (1.676 m)   Body mass index is 24.21 kg/m.  Advanced Directives 07/16/2020 07/14/2019 02/09/2019 03/27/2017 07/21/2016 07/20/2015  Does Patient Have a Medical Advance Directive? No No No No No No  Does patient want to make changes to medical advance directive? No - Patient declined - - - - -  Would patient like information on creating a medical advance directive? - Yes (MAU/Ambulatory/Procedural Areas - Information given) No - Patient declined - No - Patient declined Yes - Educational materials given    Current Medications (verified) Outpatient Encounter Medications as of 07/16/2020  Medication Sig  . amLODipine (NORVASC) 10 MG tablet TAKE 1 TABLET BY MOUTH EVERY DAY  . ASPIRIN 81 PO Take by mouth daily.  .Marland Kitchenatenolol (TENORMIN) 50 MG tablet TAKE 1 TABLET BY MOUTH EVERY DAY  . brimonidine (ALPHAGAN) 0.2 % ophthalmic solution 1 drop 2 (two) times daily.   . clotrimazole-betamethasone (LOTRISONE) cream APPLY TO AFFECTED AREA TWICE A DAY  . gabapentin (NEURONTIN) 100 MG capsule Take 100 mg by mouth 3 (three) times daily.  .Marland KitchenHYDROcodone-acetaminophen (NORCO/VICODIN) 5-325 MG tablet Take 1 tablet by mouth every 6 (six) hours as needed for moderate pain.  .Marland KitchenHYDROmorphone (DILAUDID) 4 MG tablet TAKE 1 EVERY 6 HOURS FOR PAIN MAY FILL ON 05/04/19  . latanoprost (XALATAN) 0.005 % ophthalmic solution Place 1 drop into both eyes at bedtime.   .Marland Kitchenlisinopril (ZESTRIL) 40 MG tablet TAKE 1 TABLET BY MOUTH EVERY DAY  . nystatin cream (MYCOSTATIN) Apply 1 application topically 2 (two) times daily.  . rosuvastatin (CRESTOR) 10 MG tablet TAKE 1 TABLET BY MOUTH EVERY DAY   . sulfaSALAzine (AZULFIDINE) 500 MG tablet Take 1,500 mg by mouth 2 (two) times daily. 3 tabs  . timolol (TIMOPTIC) 0.5 % ophthalmic solution Place 1 drop into both eyes 2 (two) times daily.  . traMADol (ULTRAM) 50 MG tablet Take 50 mg by mouth every 4 (four) hours as needed.   No facility-administered encounter medications on file as of 07/16/2020.    Allergies (verified) Mercaptopurine, Lyrica [pregabalin], and Dilaudid [hydromorphone hcl]   History: Past Medical History:  Diagnosis Date  . Anemia    reslved  . Colon polyp 2009  . Crohn's disease ([place]   . Diverticulosis   . Fibrocystic breast disease   . Herniated nucleus pulposus of lumbosacral region   . History of chicken pox   . Hypercholesterolemia   . Hyperlipidemia   . Hypertension   . Osteoarthritis   . Pulmonary nodule seen on imaging study 2004   resolved 2007   Past Surgical History:  Procedure Laterality Date  . anterior cervical discectomy and fusion  1998   Dr DRolin Barry . BACK SURGERY  1992   ruptured disc (Dr CMauri Pole  . BREAST BIOPSY Left    neg-no scar seen  . CATARACT EXTRACTION W/PHACO Right 02/09/2019   Procedure: CATARACT EXTRACTION PHACO AND INTRAOCULAR LENS PLACEMENT ([place], VISION BLUE;  Surgeon: BLeandrew Koyanagi MD;  [facility]  Service: Ophthalmology;  Laterality: Right;  . COLECTOMY  2003  . [place]  . COLONOSCOPY  2009  . COLONOSCOPY W/ BIOPSIES  11/30/2012   Procedure: COLONOSCOPY W/BIOPSY; Surgeon: Colvin Caroli, MD; Location: [place]; Service: Gastroenterology;;  . COLONOSCOPY W/ BIOPSIES  04/15/2016   Procedure: Colonoscopy ; Surgeon: Colvin Caroli, MD; Location: [place]; Service: Gastroenterology; Laterality: N/A;  . KNEE ARTHROSCOPY W/ SYNOVECTOMY Right 03/10/2014   limited  . KNEE SURGERY Right 1/16   [facility] (Dr. Sherlynn Carbon)  . POSTERIOR LAMINECTOMY / DECOMPRESSION  LUMBAR SPINE  1999  . REPLACEMENT TOTAL KNEE    . ROTATOR CUFF REPAIR Left 2007   acute open; accident   Family History  Problem Relation Age of Onset  . CVA Mother   . Hypertension Mother   . Hypertension Sister        brain tumor  . Breast cancer Neg Hx   . Colon cancer Neg Hx    Social History   Socioeconomic History  . Marital status: Married    Spouse name: Not on file  . Number of children: 0  . Years of education: 40  . Highest education level: Not on file  Occupational History  . Not on file  Tobacco Use  . Smoking status: Former Smoker    Quit date: 09/10/2009    Years since quitting: 10.8  . Smokeless tobacco: Never Used  Vaping Use  . Vaping Use: Never used  Substance and Sexual Activity  . Alcohol use: Yes    Alcohol/week: 1.0 standard drink    Types: 1 Glasses of wine per week    Comment: wine occasional; socially  . Drug use: No  . Sexual activity: Yes  Other Topics Concern  . Not on file  Social History Narrative  . Not on file   Social Determinants of Health   Financial Resource Strain:   . Difficulty of Paying Living Expenses: Not on file  Food Insecurity:   . Worried About Charity fundraiser in the Last Year: Not on file  . Ran Out of Food in the Last Year: Not on file  Transportation Needs:   . Lack of Transportation (Medical): Not on file  . Lack of Transportation (Non-Medical): Not on file  Physical Activity:   . Days of Exercise per Week: Not on file  . Minutes of Exercise per Session: Not on file  Stress:   . Feeling of Stress : Not on file  Social Connections:   . Frequency of Communication with Friends and Family: Not on file  . Frequency of Social Gatherings with Friends and Family: Not on file  . Attends Religious Services: Not on file  . Active Member of Clubs or Organizations: Not on file  . Attends Archivist Meetings: Not on file  . Marital Status: Not on file    Tobacco Counseling Counseling given: Not  Answered   Clinical Intake:  Pre-visit preparation completed: Yes        Diabetes: No  How often do you need to have someone help you when you read instructions, pamphlets, or other written materials from your doctor or pharmacy?: 1 - Never  Interpreter Needed?: No      Activities of Daily Living In your present state of health, do you have any difficulty performing the following activities: 07/16/2020  Hearing? N  Vision? N  Difficulty concentrating or making decisions? N  Walking or climbing stairs? Y  Dressing or bathing? N  Conservation officer, nature and  eating ? N  Using the Toilet? N  In the past six months, have you accidently leaked urine? N  Do you have problems with loss of bowel control? N  Managing your Medications? N  Managing your Finances? N  Housekeeping or managing your Housekeeping? N  Some recent data might be hidden    Patient Care Team: Einar Pheasant, MD as PCP - General (Internal Medicine)  Indicate any recent Medical Services you may have received from other than [facility] providers in the past year (date may be approximate).     Assessment:   This is a routine wellness examination for Verena.  I connected with Concha today by telephone and verified that I am speaking with the correct person using two identifiers. Location patient: home Location provider: work Persons participating in the virtual visit: patient, Marine scientist.    I discussed the limitations, risks, security and privacy concerns of performing an evaluation and management service by telephone and the availability of in person appointments. I also discussed with the patient that there may be a patient responsible charge related to this service. The patient expressed understanding and verbally consented to this telephonic visit.    Interactive audio and video telecommunications were attempted between this provider and patient, however failed, due to patient having technical difficulties OR patient did  not have access to video capability.  We continued and completed visit with audio only.  Some vital signs may be absent or patient reported.   Hearing/Vision screen  Hearing Screening   125Hz  250Hz  500Hz  1000Hz  2000Hz  3000Hz  4000Hz  6000Hz  8000Hz   Right ear:           Left ear:           Comments: Patient is able to hear conversational tones without difficulty.  No issues reported.   Dietary issues and exercise activities discussed: Current Exercise Habits: Home exercise routine, Intensity: Mild  Goals      Patient Stated   .  Follow up with Primary Care Provider (pt-stated)      As needed      Depression Screen PHQ 2/9 Scores 07/16/2020 07/14/2019 03/17/2019 07/21/2016 07/14/2016 07/02/2016 04/28/2016  PHQ - 2 Score 0 0 0 0 0 0 0    Fall Risk Fall Risk  07/16/2020 07/14/2019 07/21/2016 07/14/2016 07/02/2016  Falls in the past year? 0 0 No No No  Number falls in past yr: 0 - - - -  Follow up Falls evaluation completed Falls prevention discussed;Education provided - - -    FALL RISK PREVENTION PERTAINING TO THE HOME: Handrails in use when climbing stairs? Yes Home free of loose throw rugs in walkways, pet beds, electrical cords, etc? Yes  Adequate lighting in your home to reduce risk of falls? Yes   ASSISTIVE DEVICES UTILIZED TO PREVENT FALLS: Use of a cane, walker or w/c? Yes , as needed Elevated toilet seat or a handicapped toilet? Yes   TIMED UP AND GO: Was the test performed? No . Virtual visit.   Cognitive Function: MMSE - Mini Mental State Exam 07/21/2016 07/20/2015  Orientation to time 5 5  Orientation to Place 5 5  Registration 3 3  Attention/ Calculation 5 5  Recall 2 3  Recall-comments 2 out of 3 recalled -  Language- name 2 objects 2 2  Language- repeat 1 1  Language- follow 3 step command 3 3  Language- read & follow direction 1 1  Write a sentence 1 1  Copy design 1 1  Total score  29 30     6CIT Screen 07/16/2020 07/14/2019  What Year? 0 points 0 points    What month? 0 points 0 points  What time? 0 points 0 points  Count back from 20 0 points 0 points  Months in reverse 0 points 0 points    Immunizations Immunization History  Administered Date(s) Administered  . Fluad Quad(high Dose 65+) 07/26/2019, 06/20/2020  . Influenza Split 05/13/2011, 08/12/2012, 04/29/2014  . Influenza, High Dose Seasonal PF 07/14/2016, 07/15/2017, 04/29/2018  . Influenza, Seasonal, Injecte, Preservative Fre 06/05/2014  . Influenza,inj,Quad PF,6+ Mos 05/06/2013, 06/08/2015  . PFIZER SARS-COV-2 Vaccination 09/21/2019, 10/12/2019, 06/06/2020  . Pneumococcal Conjugate-13 07/21/2016  . Pneumococcal Polysaccharide-23 09/01/2014    TDAP status: Due, Education has been provided regarding the importance of this vaccine. Advised may receive this vaccine at local pharmacy or Health Dept. Aware to provide a copy of the vaccination record if obtained from local pharmacy or Health Dept. Verbalized acceptance and understanding. Deferred.   Health Maintenance Health Maintenance  Topic Date Due  . Hepatitis C Screening  Never done  . TETANUS/TDAP  07/16/2021 (Originally 11/18/1964)  . MAMMOGRAM  09/06/2020  . COLONOSCOPY  12/01/2022  . INFLUENZA VACCINE  Completed  . DEXA SCAN  Completed  . COVID-19 Vaccine  Completed  . PNA vac Low Risk Adult  Completed   Colorectal cancer screening: Type of screening: Colonoscopy. Completed 11/30/12. Repeat every 10 years  Mammogram status: Completed 09/07/19. Repeat every year. 3D Screening.   Bone Density 06/21/14.   Lung Cancer Screening: (Low Dose CT Chest recommended if Age 55-80 years, 30 pack-year currently smoking OR have quit w/in 15years.) does not qualify.   Hepatitis C Screening: declined.    Vision Screening: Recommended annual ophthalmology exams for early detection of glaucoma and other disorders of the eye. Is the patient up to date with their annual eye exam?  Yes   Dental Screening: Recommended annual dental  exams for proper oral hygiene  Community Resource Referral / Chronic Care Management: CRR required this visit?  No   CCM required this visit?  No      Plan:   Keep all routine maintenance appointments.   Next scheduled lab nurse visit 07/25/20 @ 9:30  Follow up 10/18/20 @ 11:30  I have personally reviewed and noted the following in the patient's chart:   . Medical and social history . Use of alcohol, tobacco or illicit drugs  . Current medications and supplements . Functional ability and status . Nutritional status . Physical activity . Advanced directives . List of other physicians . Hospitalizations, surgeries, and ER visits in previous 12 months . Vitals . Screenings to include cognitive, depression, and falls . Referrals and appointments  In addition, I have reviewed and discussed with patient certain preventive protocols, quality metrics, and best practice recommendations. A written personalized care plan for preventive services as well as general preventive health recommendations were provided to patient via mail.     Varney Biles, LPN   [id number]     Reviewed above information.  Agree with assessment and plan.   Dr Nicki Reaper

## 2020-07-25 ENCOUNTER — Ambulatory Visit (INDEPENDENT_AMBULATORY_CARE_PROVIDER_SITE_OTHER): Payer: Medicare HMO

## 2020-07-25 ENCOUNTER — Other Ambulatory Visit: Payer: Self-pay

## 2020-07-25 DIAGNOSIS — E538 Deficiency of other specified B group vitamins: Secondary | ICD-10-CM

## 2020-07-25 DIAGNOSIS — K5010 Crohn's disease of large intestine without complications: Secondary | ICD-10-CM | POA: Diagnosis not present

## 2020-07-25 DIAGNOSIS — I10 Essential (primary) hypertension: Secondary | ICD-10-CM

## 2020-07-25 DIAGNOSIS — E7800 Pure hypercholesterolemia, unspecified: Secondary | ICD-10-CM | POA: Diagnosis not present

## 2020-07-25 LAB — HEPATIC FUNCTION PANEL
ALT: 15 U/L (ref 0–35)
AST: 22 U/L (ref 0–37)
Albumin: 4.2 g/dL (ref 3.5–5.2)
Alkaline Phosphatase: 66 U/L (ref 39–117)
Bilirubin, Direct: 0.1 mg/dL (ref 0.0–0.3)
Total Bilirubin: 0.4 mg/dL (ref 0.2–1.2)
Total Protein: 6.6 g/dL (ref 6.0–8.3)

## 2020-07-25 LAB — LIPID PANEL
Cholesterol: 142 mg/dL (ref 0–200)
HDL: 66.6 mg/dL (ref >39.00)
LDL Cholesterol: 61 mg/dL (ref 0–99)
NonHDL: 75.85
Total CHOL/HDL Ratio: 2
Triglycerides: 75 mg/dL (ref 0.0–149.0)
VLDL: 15 mg/dL (ref 0.0–40.0)

## 2020-07-25 LAB — CBC WITH DIFFERENTIAL/PLATELET
Basophils Absolute: 0 10*3/uL (ref 0.0–0.1)
Basophils Relative: 0.4 % (ref 0.0–3.0)
Eosinophils Absolute: 0.1 10*3/uL (ref 0.0–0.7)
Eosinophils Relative: 1 % (ref 0.0–5.0)
HCT: 37 % (ref 36.0–46.0)
Hemoglobin: 12.5 g/dL (ref 12.0–15.0)
Lymphocytes Relative: 24.5 % (ref 12.0–46.0)
Lymphs Abs: 2 10*3/uL (ref 0.7–4.0)
MCHC: 33.7 g/dL (ref 30.0–36.0)
MCV: 96.2 fl (ref 78.0–100.0)
Monocytes Absolute: 0.7 10*3/uL (ref 0.1–1.0)
Monocytes Relative: 8.4 % (ref 3.0–12.0)
Neutro Abs: 5.3 10*3/uL (ref 1.4–7.7)
Neutrophils Relative %: 65.7 % (ref 43.0–77.0)
Platelets: 175 10*3/uL (ref 150.0–400.0)
RBC: 3.85 Mil/uL — ABNORMAL LOW (ref 3.87–5.11)
RDW: 13.1 % (ref 11.5–15.5)
WBC: 8.1 10*3/uL (ref 4.0–10.5)

## 2020-07-25 LAB — BASIC METABOLIC PANEL
BUN: 14 mg/dL (ref 6–23)
CO2: 30 mEq/L (ref 19–32)
Calcium: 9.2 mg/dL (ref 8.4–10.5)
Chloride: 108 mEq/L (ref 96–112)
Creatinine, Ser: 0.81 mg/dL (ref 0.40–1.20)
GFR: 71.38 mL/min (ref >60.00)
Glucose, Bld: 90 mg/dL (ref 70–99)
Potassium: 3.7 mEq/L (ref 3.5–5.1)
Sodium: 143 mEq/L (ref 135–145)

## 2020-07-25 LAB — VITAMIN B12: Vitamin B-12: 289 pg/mL (ref 211–911)

## 2020-07-25 LAB — TSH: TSH: 2.19 u[IU]/mL (ref 0.35–4.50)

## 2020-07-25 MED ORDER — CYANOCOBALAMIN 1000 MCG/ML IJ SOLN
1000.0000 ug | Freq: Once | INTRAMUSCULAR | Status: AC
  Administered 2020-07-25: 1000 ug via INTRAMUSCULAR

## 2020-07-25 NOTE — Progress Notes (Signed)
Patient presented for B 12 injection to left deltoid, patient voiced no concerns nor showed any signs of distress during injection. 

## 2020-08-01 DIAGNOSIS — M5412 Radiculopathy, cervical region: Secondary | ICD-10-CM | POA: Diagnosis not present

## 2020-08-01 DIAGNOSIS — M7541 Impingement syndrome of right shoulder: Secondary | ICD-10-CM | POA: Diagnosis not present

## 2020-08-14 DIAGNOSIS — M722 Plantar fascial fibromatosis: Secondary | ICD-10-CM | POA: Diagnosis not present

## 2020-08-14 DIAGNOSIS — Z79899 Other long term (current) drug therapy: Secondary | ICD-10-CM | POA: Diagnosis not present

## 2020-08-14 DIAGNOSIS — Z79891 Long term (current) use of opiate analgesic: Secondary | ICD-10-CM | POA: Diagnosis not present

## 2020-08-14 DIAGNOSIS — G8929 Other chronic pain: Secondary | ICD-10-CM | POA: Diagnosis not present

## 2020-08-14 DIAGNOSIS — G894 Chronic pain syndrome: Secondary | ICD-10-CM | POA: Diagnosis not present

## 2020-08-14 DIAGNOSIS — M170 Bilateral primary osteoarthritis of knee: Secondary | ICD-10-CM | POA: Diagnosis not present

## 2020-08-18 ENCOUNTER — Other Ambulatory Visit: Payer: Self-pay | Admitting: Internal Medicine

## 2020-08-23 ENCOUNTER — Ambulatory Visit (INDEPENDENT_AMBULATORY_CARE_PROVIDER_SITE_OTHER): Payer: Medicare HMO

## 2020-08-23 ENCOUNTER — Other Ambulatory Visit: Payer: Self-pay

## 2020-08-23 DIAGNOSIS — E538 Deficiency of other specified B group vitamins: Secondary | ICD-10-CM | POA: Diagnosis not present

## 2020-08-23 MED ORDER — CYANOCOBALAMIN 1000 MCG/ML IJ SOLN
1000.0000 ug | Freq: Once | INTRAMUSCULAR | Status: AC
  Administered 2020-08-23: 1000 ug via INTRAMUSCULAR

## 2020-08-23 NOTE — Progress Notes (Signed)
Patient presented for B 12 injection to left deltoid, patient voiced no concerns nor showed any signs of distress during injection. 

## 2020-09-13 ENCOUNTER — Telehealth: Payer: Self-pay | Admitting: Internal Medicine

## 2020-09-13 MED ORDER — ROSUVASTATIN CALCIUM 10 MG PO TABS: 10.0000 mg | ORAL_TABLET | Freq: Every day | ORAL | 1 refills | Status: DC

## 2020-09-13 MED ORDER — ATENOLOL 50 MG PO TABS: 50.0000 mg | ORAL_TABLET | Freq: Every day | ORAL | 1 refills | Status: DC

## 2020-09-13 MED ORDER — AMLODIPINE BESYLATE 10 MG PO TABS: 10.0000 mg | ORAL_TABLET | Freq: Every day | ORAL | 1 refills | Status: DC

## 2020-09-13 MED ORDER — LISINOPRIL 40 MG PO TABS: 40.0000 mg | ORAL_TABLET | Freq: Every day | ORAL | 1 refills | Status: DC

## 2020-09-13 NOTE — Telephone Encounter (Signed)
[facility] called in refill foramLODipine (NORVASC) 10 MG tablet , atenolol (TENORMIN) 50 MG tablet ,lisinopril (ZESTRIL) 40 MG tablet ,  rosuvastatin (CRESTOR) 10 MG tablet for patient

## 2020-09-14 IMAGING — US US EXTREM LOW VENOUS*R*
1 series · 14 of 24 positions shown · non-contrast
Comparison: None

CLINICAL DATA: Thigh pain and swelling, progressive since
colonoscopy 3 weeks ago. Previous knee replacement surgery

EXAM:
RIGHT LOWER EXTREMITY VENOUS DOPPLER ULTRASOUND
TECHNIQUE: Gray-scale sonography with compression, as well as color and duplex
ultrasound, were performed to evaluate the deep venous system from
the level of the common femoral vein through the popliteal and
proximal calf veins.

[Series 1: us extrem low venous*right* · 0.06mm/px · 14 of 93 slices shown]
[im 1/93]
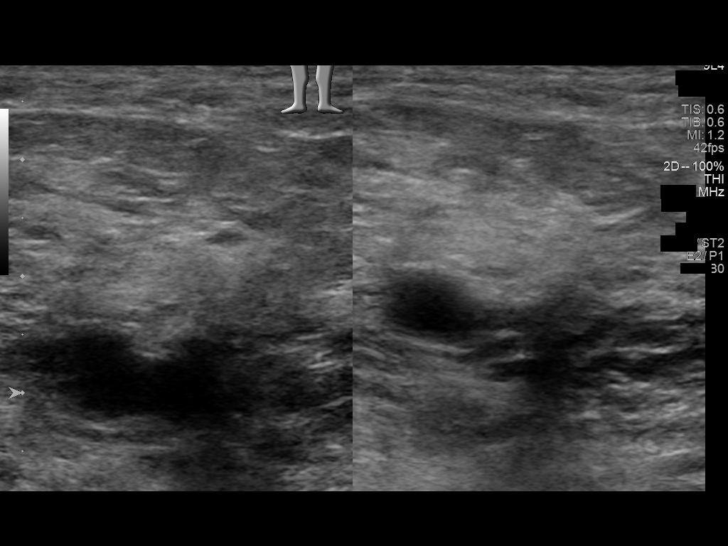
[im 9/93]
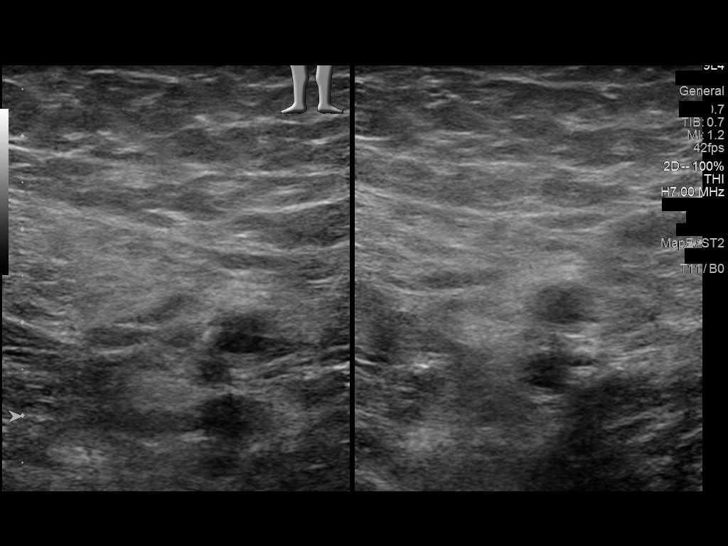
[im 17/93]
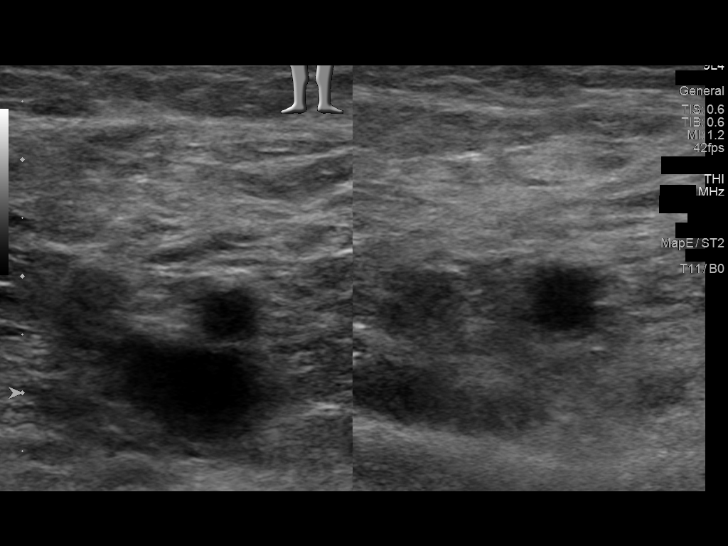
[im 25/93]
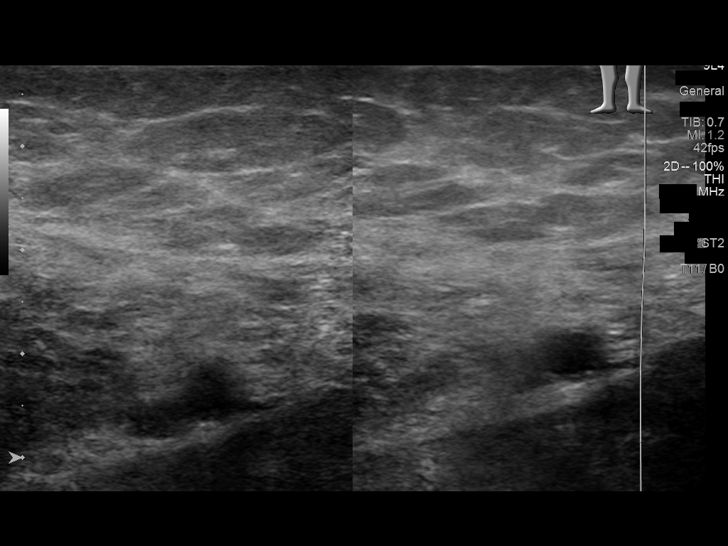
[im 29/93]
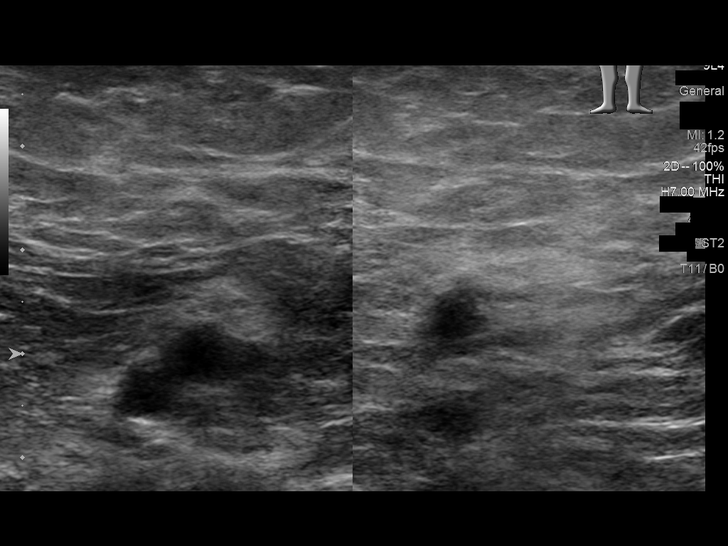
[im 37/93]
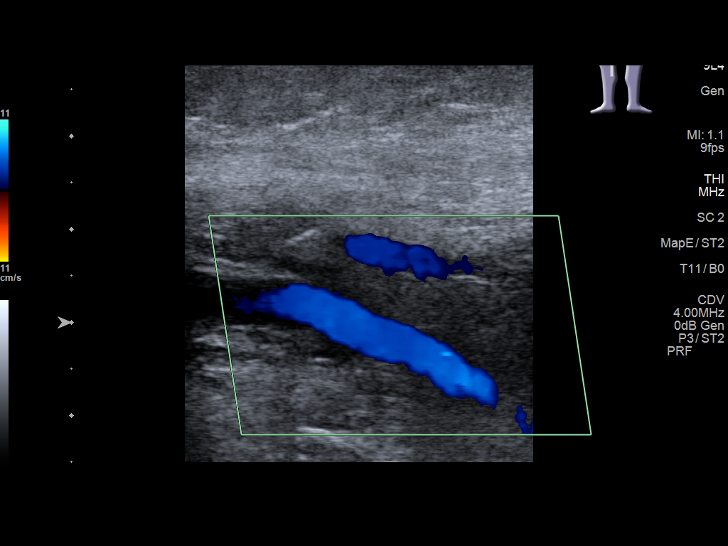
[im 45/93]
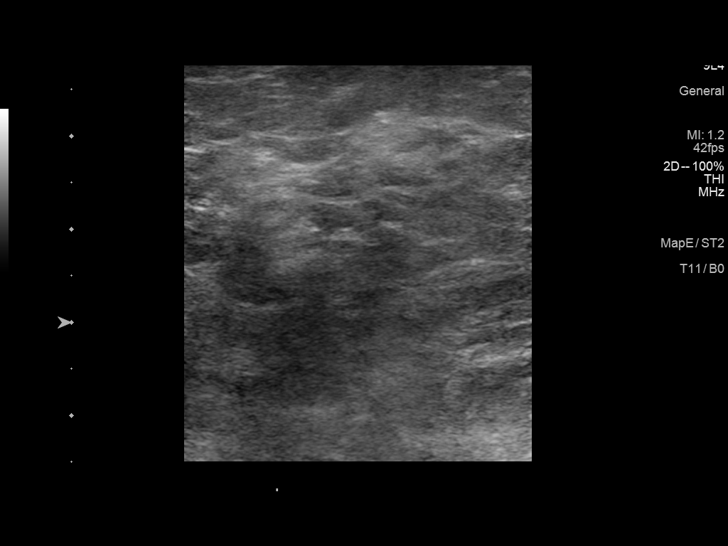
[im 49/93]
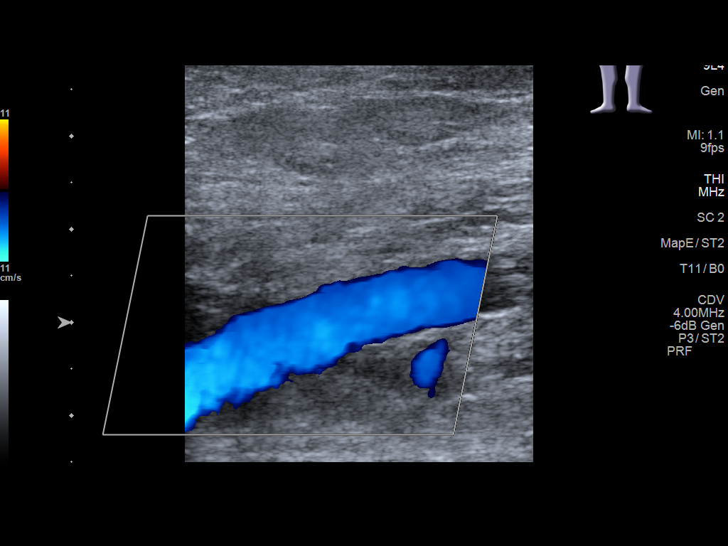
[im 57/93]
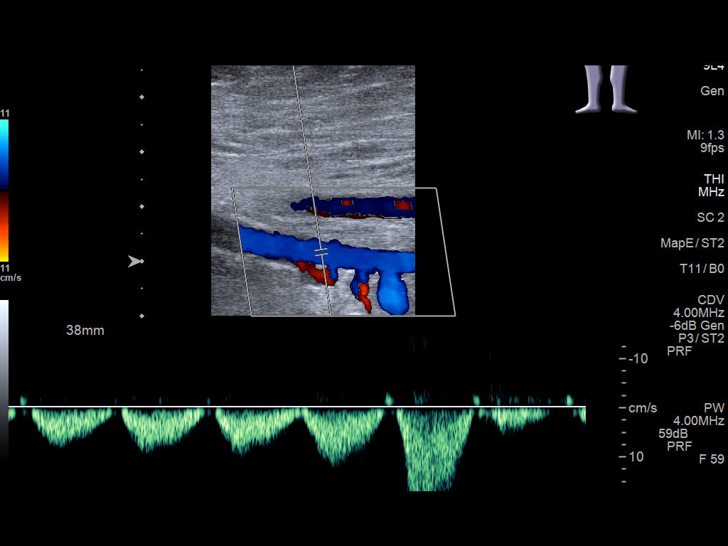
[im 65/93]
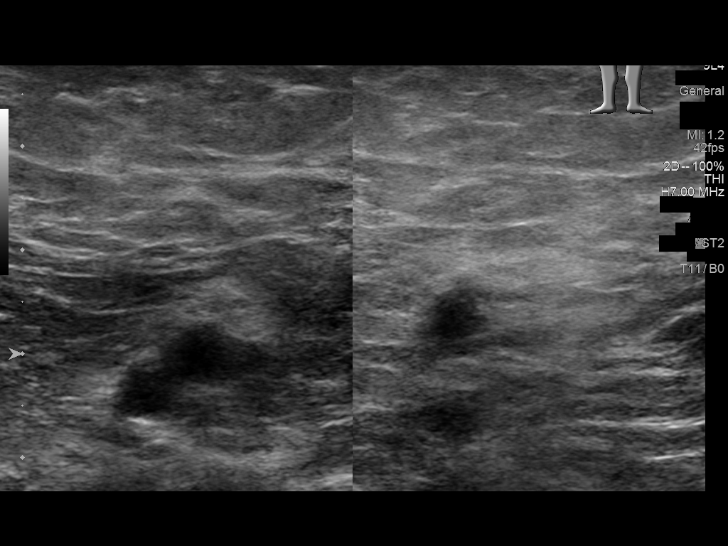
[im 73/93]
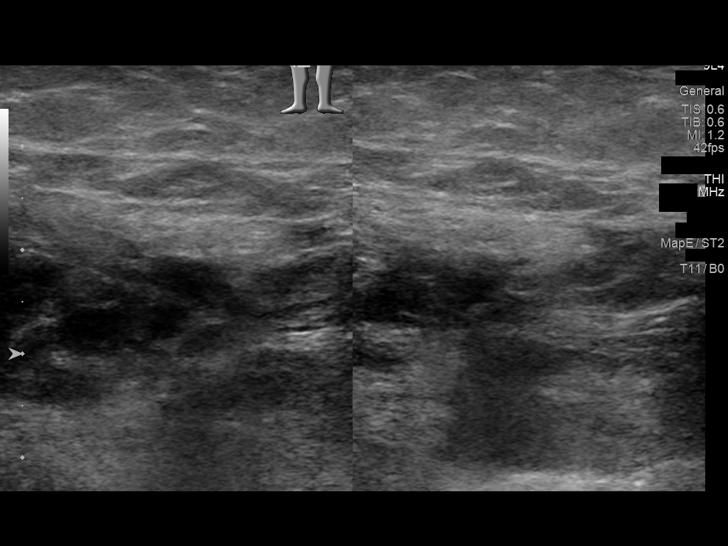
[im 77/93]
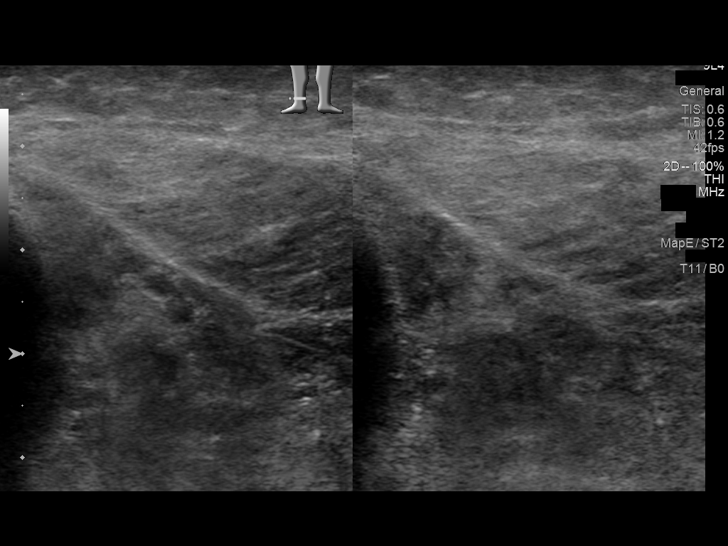
[im 85/93]
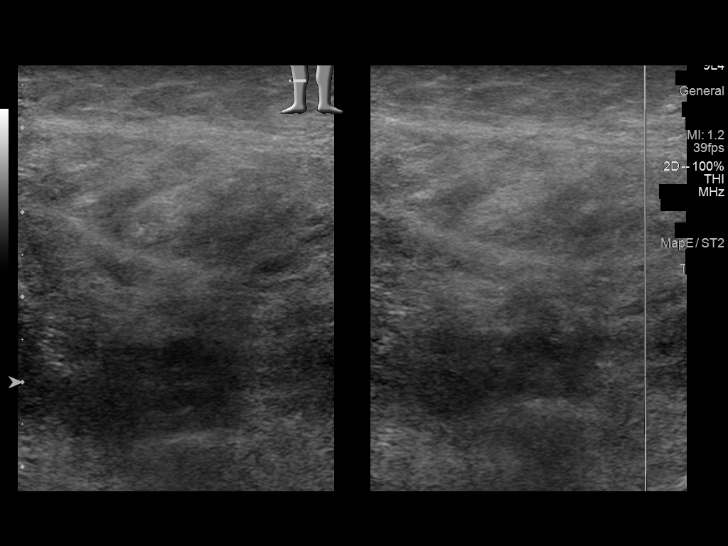
[im 93/93]
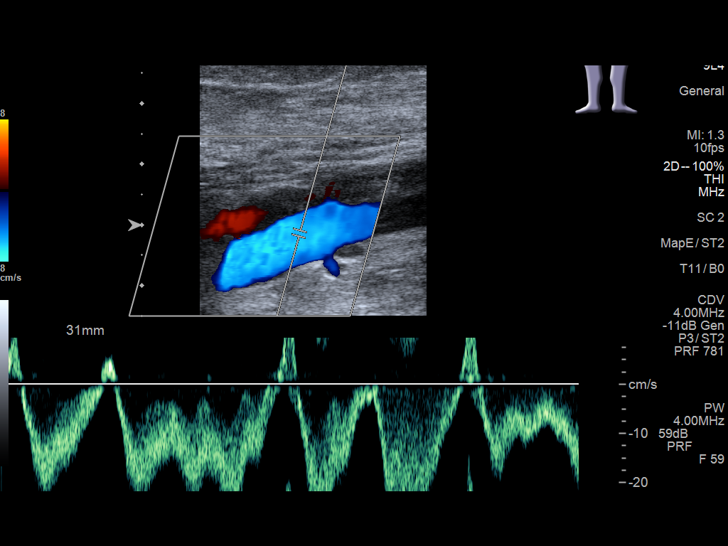

[14 of 24 positions shown; findings below may reference images not displayed]

FINDINGS: Normal compressibility of the common femoral, superficial femoral,
and popliteal veins, as well as the proximal calf veins. No filling
defects to suggest DVT on grayscale or color Doppler imaging.
Doppler waveforms show normal direction of venous flow, normal
respiratory phasicity and response to augmentation. No Baker's cyst
identified.

Survey views of the contralateral common femoral vein are
unremarkable.
IMPRESSION: No femoropopliteal and no calf DVT in the visualized calf veins. If
clinical symptoms are inconsistent or if there are persistent or
worsening symptoms, further imaging (possibly involving the iliac
veins) may be warranted.

## 2020-09-25 ENCOUNTER — Ambulatory Visit (INDEPENDENT_AMBULATORY_CARE_PROVIDER_SITE_OTHER): Payer: Medicare HMO

## 2020-09-25 ENCOUNTER — Other Ambulatory Visit: Payer: Self-pay

## 2020-09-25 DIAGNOSIS — E538 Deficiency of other specified B group vitamins: Secondary | ICD-10-CM | POA: Diagnosis not present

## 2020-09-25 MED ORDER — CYANOCOBALAMIN 1000 MCG/ML IJ SOLN
1000.0000 ug | Freq: Once | INTRAMUSCULAR | Status: AC
  Administered 2020-09-25: 1000 ug via INTRAMUSCULAR

## 2020-09-25 NOTE — Progress Notes (Signed)
Patient presented for B 12 injection to left deltoid, patient voiced no concerns nor showed any signs of distress during injection. 

## 2020-10-18 ENCOUNTER — Ambulatory Visit: Payer: Medicare HMO | Admitting: Internal Medicine

## 2020-10-30 ENCOUNTER — Ambulatory Visit (INDEPENDENT_AMBULATORY_CARE_PROVIDER_SITE_OTHER): Payer: Medicare HMO

## 2020-10-30 ENCOUNTER — Ambulatory Visit (INDEPENDENT_AMBULATORY_CARE_PROVIDER_SITE_OTHER): Payer: Medicare HMO | Admitting: Internal Medicine

## 2020-10-30 ENCOUNTER — Other Ambulatory Visit: Payer: Self-pay

## 2020-10-30 ENCOUNTER — Encounter: Payer: Self-pay | Admitting: Internal Medicine

## 2020-10-30 VITALS — BP 138/88 | HR 67 | Temp 97.4°F | Ht 66.0 in | Wt 149.2 lb

## 2020-10-30 DIAGNOSIS — E538 Deficiency of other specified B group vitamins: Secondary | ICD-10-CM | POA: Diagnosis not present

## 2020-10-30 DIAGNOSIS — E7800 Pure hypercholesterolemia, unspecified: Secondary | ICD-10-CM

## 2020-10-30 DIAGNOSIS — M25561 Pain in right knee: Secondary | ICD-10-CM

## 2020-10-30 DIAGNOSIS — K5010 Crohn's disease of large intestine without complications: Secondary | ICD-10-CM

## 2020-10-30 DIAGNOSIS — I10 Essential (primary) hypertension: Secondary | ICD-10-CM | POA: Diagnosis not present

## 2020-10-30 DIAGNOSIS — Z1231 Encounter for screening mammogram for malignant neoplasm of breast: Secondary | ICD-10-CM

## 2020-10-30 DIAGNOSIS — M7989 Other specified soft tissue disorders: Secondary | ICD-10-CM | POA: Diagnosis not present

## 2020-10-30 DIAGNOSIS — M25511 Pain in right shoulder: Secondary | ICD-10-CM

## 2020-10-30 DIAGNOSIS — Z471 Aftercare following joint replacement surgery: Secondary | ICD-10-CM | POA: Diagnosis not present

## 2020-10-30 DIAGNOSIS — Z96651 Presence of right artificial knee joint: Secondary | ICD-10-CM | POA: Diagnosis not present

## 2020-10-30 MED ORDER — CYANOCOBALAMIN 1000 MCG/ML IJ SOLN
1000.0000 ug | Freq: Once | INTRAMUSCULAR | Status: AC
  Administered 2020-10-30: 1000 ug via INTRAMUSCULAR

## 2020-10-30 NOTE — Progress Notes (Signed)
Patient ID: Ellen Drake, female   DOB: [birth year], 75 y.o.   MRN: [id number]   Subjective:    Patient ID: Ellen Drake, female    DOB: [birth year], 75 y.o.   MRN: [id number]  HPI This visit occurred during the SARS-CoV-2 public health emergency.  Safety protocols were in place, including screening questions prior to the visit, additional usage of staff PPE, and extensive cleaning of exam room while observing appropriate contact time as indicated for disinfecting solutions.  Patient here for a scheduled follow up.  Here to follow up regarding her blood pressure and cholesterol.  She is also having persistent pain in her knee.  Has seen various specialists without relief.  She request referral to [facility] pain clinic.  Previously seeing Dr Gerrit Heck, but he retired.  No chest pain or sob reported.  No abdominal pain or bowel change reported.  Does have increased right shoulder pain and limited rom.  Request referral to orthopedist.  Blood pressure per her report is doing well.     Past Medical History:  Diagnosis Date  . Anemia    reslved  . Colon polyp 2009  . Crohn's disease ([place])   . Diverticulosis   . Fibrocystic breast disease   . Herniated nucleus pulposus of lumbosacral region   . History of chicken pox   . Hypercholesterolemia   . Hyperlipidemia   . Hypertension   . Osteoarthritis   . Pulmonary nodule seen on imaging study 2004   resolved 2007   Past Surgical History:  Procedure Laterality Date  . anterior cervical discectomy and fusion  1998   Dr Rolin Barry  . BACK SURGERY  1992   ruptured disc (Dr Mauri Pole)  . BREAST BIOPSY Left    neg-no scar seen  . CATARACT EXTRACTION W/PHACO Right 02/09/2019   Procedure: CATARACT EXTRACTION PHACO AND INTRAOCULAR LENS PLACEMENT ([place]) [place]  RIGHT HEALON 5, VISION BLUE;  Surgeon: Leandrew Koyanagi, MD;  Location: [place];  Service: Ophthalmology;  Laterality: Right;  . COLECTOMY  2003  . COLON SURGERY   1998  . COLONOSCOPY  2009  . COLONOSCOPY W/ BIOPSIES  11/30/2012   Procedure: COLONOSCOPY W/BIOPSY; Surgeon: Colvin Caroli, MD; Location: [place]; Service: Gastroenterology;;  . COLONOSCOPY W/ BIOPSIES  04/15/2016   Procedure: Colonoscopy ; Surgeon: Colvin Caroli, MD; Location: [place]; Service: Gastroenterology; Laterality: N/A;  . KNEE ARTHROSCOPY W/ SYNOVECTOMY Right 03/10/2014   limited  . KNEE SURGERY Right 1/16   [facility] (Dr. Sherlynn Carbon)  . POSTERIOR LAMINECTOMY / DECOMPRESSION LUMBAR SPINE  1999  . REPLACEMENT TOTAL KNEE    . ROTATOR CUFF REPAIR Left 2007   acute open; accident   Family History  Problem Relation Age of Onset  . CVA Mother   . Hypertension Mother   . Hypertension Sister        brain tumor  . Breast cancer Neg Hx   . Colon cancer Neg Hx    Social History   Socioeconomic History  . Marital status: Married    Spouse name: Not on file  . Number of children: 0  . Years of education: 34  . Highest education level: Not on file  Occupational History  . Not on file  Tobacco Use  . Smoking status: Former Smoker    Quit date: 09/10/2009    Years since quitting: 11.1  . Smokeless tobacco: Never Used  Vaping Use  . Vaping Use: Never used  Substance and Sexual Activity  . Alcohol use: Yes    Alcohol/week: 1.0 standard drink    Types: 1 Glasses of wine per week    Comment: wine occasional; socially  . Drug use: No  . Sexual activity: Yes  Other Topics Concern  . Not on file  Social History Narrative  . Not on file   Social Determinants of Health   Financial Resource Strain: Not on file  Food Insecurity: Not on file  Transportation Needs: Not on file  Physical Activity: Not on file  Stress: Not on file  Social Connections: Not on file    Outpatient Encounter Medications as of 10/30/2020  Medication Sig  . amLODipine (NORVASC) 10 MG tablet Take 1 tablet (10 mg total) by mouth daily.  . ASPIRIN 81 PO Take by  mouth daily.  Marland Kitchen atenolol (TENORMIN) 50 MG tablet Take 1 tablet (50 mg total) by mouth daily.  . brimonidine (ALPHAGAN) 0.2 % ophthalmic solution 1 drop 2 (two) times daily.   . clotrimazole-betamethasone (LOTRISONE) cream APPLY TO AFFECTED AREA TWICE A DAY  . gabapentin (NEURONTIN) 100 MG capsule Take 100 mg by mouth 3 (three) times daily.  Marland Kitchen HYDROcodone-acetaminophen (NORCO/VICODIN) 5-325 MG tablet Take 1 tablet by mouth every 6 (six) hours as needed for moderate pain.  Marland Kitchen HYDROmorphone (DILAUDID) 4 MG tablet TAKE 1 EVERY 6 HOURS FOR PAIN MAY FILL ON 05/04/19  . latanoprost (XALATAN) 0.005 % ophthalmic solution Place 1 drop into both eyes at bedtime.   Marland Kitchen lisinopril (ZESTRIL) 40 MG tablet Take 1 tablet (40 mg total) by mouth daily.  Marland Kitchen nystatin cream (MYCOSTATIN) Apply 1 application topically 2 (two) times daily.  . rosuvastatin (CRESTOR) 10 MG tablet Take 1 tablet (10 mg total) by mouth daily.  Marland Kitchen sulfaSALAzine (AZULFIDINE) 500 MG tablet Take 1,500 mg by mouth 2 (two) times daily. 3 tabs  . timolol (TIMOPTIC) 0.5 % ophthalmic solution Place 1 drop into both eyes 2 (two) times daily.  . traMADol (ULTRAM) 50 MG tablet Take 50 mg by mouth every 4 (four) hours as needed.  . [EXPIRED] cyanocobalamin ((VITAMIN B-12)) injection 1,000 mcg    No facility-administered encounter medications on file as of 10/30/2020.    Review of Systems  Constitutional: Negative for appetite change and unexpected weight change.  HENT: Negative for congestion and sinus pressure.   Respiratory: Negative for cough, chest tightness and shortness of breath.   Cardiovascular: Negative for chest pain, palpitations and leg swelling.  Gastrointestinal: Negative for abdominal pain, diarrhea, nausea and vomiting.  Genitourinary: Negative for difficulty urinating and dysuria.  Musculoskeletal: Negative for myalgias.       Right shoulder pain as outlined.  Persistent knee pain and swelling.    Skin: Negative for color change and  rash.  Neurological: Negative for dizziness, light-headedness and headaches.  Psychiatric/Behavioral: Negative for agitation and dysphoric mood.       Objective:    Physical Exam Vitals reviewed.  Constitutional:      General: She is not in acute distress.    Appearance: Normal appearance.  HENT:     Head: Normocephalic and atraumatic.     Right Ear: External ear normal.     Left Ear: External ear normal.  Eyes:     General: No scleral icterus.       Right eye: No discharge.        Left eye: No discharge.     Conjunctiva/sclera: Conjunctivae normal.  Neck:     Thyroid: No thyromegaly.  Cardiovascular:     Rate and Rhythm: Normal rate and regular rhythm.  Pulmonary:     Effort: No respiratory distress.     Breath sounds: Normal breath sounds. No wheezing.  Abdominal:     General: Bowel sounds are normal.     Palpations: Abdomen is soft.     Tenderness: There is no abdominal tenderness.  Musculoskeletal:     Cervical back: Neck supple. No tenderness.     Comments: Limited rom - right shoulder.  Increased pain - especially at or above 90 degrees.  Persistent swelling - knee.    Lymphadenopathy:     Cervical: No cervical adenopathy.  Skin:    Findings: No erythema or rash.  Neurological:     Mental Status: She is alert.  Psychiatric:        Mood and Affect: Mood normal.        Behavior: Behavior normal.     BP 138/88   Pulse 67   Temp (!) 97.4 F (36.3 C)   Ht 5' 6"  (1.676 m)   Wt 149 lb 4 oz (67.7 kg)   LMP 08/13/1995   SpO2 99%   BMI 24.09 kg/m  Wt Readings from Last 3 Encounters:  10/30/20 149 lb 4 oz (67.7 kg)  07/16/20 150 lb (68 kg)  06/20/20 150 lb 6.4 oz (68.2 kg)     Lab Results  Component Value Date   WBC 8.1 07/25/2020   HGB 12.5 07/25/2020   HCT 37.0 07/25/2020   PLT 175.0 07/25/2020   GLUCOSE 85 10/30/2020   CHOL 151 10/30/2020   TRIG 149.0 10/30/2020   HDL 61.90 10/30/2020   LDLDIRECT 132.5 05/06/2013   LDLCALC 60 10/30/2020   ALT  16 10/30/2020   AST 23 10/30/2020   NA 144 10/30/2020   K 3.7 10/30/2020   CL 107 10/30/2020   CREATININE 0.79 10/30/2020   BUN 15 10/30/2020   CO2 30 10/30/2020   TSH 2.19 07/25/2020   HGBA1C 5.7 10/19/2012    DG Shoulder Right  Result Date: 03/16/2020 CLINICAL DATA:  Persistent right shoulder pain.  No known injury. EXAM: RIGHT SHOULDER - 2+ VIEW COMPARISON:  Chest x-ray 12/26/2013. FINDINGS: Acromioclavicular and glenohumeral degenerative change. Downsloping acromion with prominent subacromial spurring. Right shoulder is high-riding suggesting chronic rotator cuff tear. No acute bony abnormality. No evidence of fracture or dislocation. Prior cervical spine fusion degenerative change thoracic spine. IMPRESSION: Acromioclavicular and glenohumeral degenerative change. Downsloping acromion with prominent subacromial spurring. Right shoulder is high-riding suggesting chronic rotator cuff tear. No acute abnormality identified. Electronically Signed   By: Marcello Moores  Register   On: 03/16/2020 13:22       Assessment & Plan:   Problem List Items Addressed This Visit    B12 deficiency    Continue B12 injections.       Crohn's disease ([place])    Followed by Dr Redmond Pulling.  Bowels stable.  Continue sulfasalazine.        Hypercholesterolemia - Primary    On crestor.  Low cholesterol diet and exercise.  Follow lipid panel and liver function tests.        Relevant Orders   Hepatic function panel (Completed)   Lipid panel (Completed)   Hypertension    Continue lisinopril, amlodipine and atenolol.  Blood pressure has been ok.  A little elevated today.  Have her spot check her pressure.  Continue same medication.  If persistent elevation, will adjust.  Follow metabolic panel.  Relevant Orders   Basic metabolic panel (Completed)   Knee pain    Persistent pain.  Has seen multiple specialist.  Not a surgical candidate now.  Dr Gerrit Heck retired.  Request referral to pain clinic at [facility].        Relevant Orders   DG Knee 1-2 Views Right (Completed)   Ambulatory referral to Pain Clinic   Right shoulder pain    Persistent pain as outlined.  Request referral to ortho.       Relevant Orders   Ambulatory referral to Orthopedic Surgery    Other Visit Diagnoses    Encounter for screening mammogram for malignant neoplasm of breast       Relevant Orders   MM 3D SCREEN BREAST BILATERAL       Einar Pheasant, MD

## 2020-10-31 LAB — LIPID PANEL
Cholesterol: 151 mg/dL (ref 0–200)
HDL: 61.9 mg/dL (ref >39.00)
LDL Cholesterol: 60 mg/dL (ref 0–99)
NonHDL: 89.48
Total CHOL/HDL Ratio: 2
Triglycerides: 149 mg/dL (ref 0.0–149.0)
VLDL: 29.8 mg/dL (ref 0.0–40.0)

## 2020-10-31 LAB — HEPATIC FUNCTION PANEL
ALT: 16 U/L (ref 0–35)
AST: 23 U/L (ref 0–37)
Albumin: 4.5 g/dL (ref 3.5–5.2)
Alkaline Phosphatase: 73 U/L (ref 39–117)
Bilirubin, Direct: 0.1 mg/dL (ref 0.0–0.3)
Total Bilirubin: 0.4 mg/dL (ref 0.2–1.2)
Total Protein: 6.8 g/dL (ref 6.0–8.3)

## 2020-10-31 LAB — BASIC METABOLIC PANEL
BUN: 15 mg/dL (ref 6–23)
CO2: 30 mEq/L (ref 19–32)
Calcium: 9.7 mg/dL (ref 8.4–10.5)
Chloride: 107 mEq/L (ref 96–112)
Creatinine, Ser: 0.79 mg/dL (ref 0.40–1.20)
GFR: 73.41 mL/min (ref >60.00)
Glucose, Bld: 85 mg/dL (ref 70–99)
Potassium: 3.7 mEq/L (ref 3.5–5.1)
Sodium: 144 mEq/L (ref 135–145)

## 2020-11-01 ENCOUNTER — Telehealth: Payer: Self-pay

## 2020-11-01 NOTE — Telephone Encounter (Signed)
Patient would like a referral to [facility] pain clinic.

## 2020-11-02 NOTE — Assessment & Plan Note (Addendum)
Persistent pain.  Has seen multiple specialist.  Not a surgical candidate now.  Dr Gerrit Heck retired.  Request referral to pain clinic at [facility].

## 2020-11-02 NOTE — Telephone Encounter (Signed)
See note. Order for pain clinic.

## 2020-11-04 ENCOUNTER — Encounter: Payer: Self-pay | Admitting: Internal Medicine

## 2020-11-04 NOTE — Assessment & Plan Note (Signed)
On crestor.  Low cholesterol diet and exercise.  Follow lipid panel and liver function tests.

## 2020-11-04 NOTE — Assessment & Plan Note (Signed)
Continue B12 injections.

## 2020-11-04 NOTE — Assessment & Plan Note (Signed)
Continue lisinopril, amlodipine and atenolol.  Blood pressure has been ok.  A little elevated today.  Have her spot check her pressure.  Continue same medication.  If persistent elevation, will adjust.  Follow metabolic panel.

## 2020-11-04 NOTE — Assessment & Plan Note (Signed)
Followed by Dr Redmond Pulling.  Bowels stable.  Continue sulfasalazine.

## 2020-11-04 NOTE — Assessment & Plan Note (Signed)
Persistent pain as outlined.  Request referral to ortho.

## 2020-11-06 ENCOUNTER — Ambulatory Visit: Payer: Medicare HMO

## 2020-11-07 DIAGNOSIS — M25511 Pain in right shoulder: Secondary | ICD-10-CM | POA: Diagnosis not present

## 2020-11-12 ENCOUNTER — Ambulatory Visit
Admission: RE | Admit: 2020-11-12 | Discharge: 2020-11-12 | Disposition: A | Discharge status: Home / Self Care | Payer: Medicare HMO | Source: Ambulatory Visit | Attending: Internal Medicine | Admitting: Internal Medicine

## 2020-11-12 ENCOUNTER — Other Ambulatory Visit: Payer: Self-pay

## 2020-11-12 DIAGNOSIS — Z1231 Encounter for screening mammogram for malignant neoplasm of breast: Secondary | ICD-10-CM

## 2020-11-23 ENCOUNTER — Other Ambulatory Visit: Payer: Self-pay | Admitting: Internal Medicine

## 2020-12-03 ENCOUNTER — Telehealth: Payer: Self-pay | Admitting: Internal Medicine

## 2020-12-03 NOTE — Telephone Encounter (Signed)
Patient called in wanted to know if Dr.Scott can call her something in until she go to the referral doctor

## 2020-12-04 ENCOUNTER — Ambulatory Visit (INDEPENDENT_AMBULATORY_CARE_PROVIDER_SITE_OTHER): Payer: Medicare HMO

## 2020-12-04 ENCOUNTER — Other Ambulatory Visit: Payer: Self-pay

## 2020-12-04 DIAGNOSIS — E538 Deficiency of other specified B group vitamins: Secondary | ICD-10-CM

## 2020-12-04 MED ORDER — CYANOCOBALAMIN 1000 MCG/ML IJ SOLN
1000.0000 ug | Freq: Once | INTRAMUSCULAR | Status: AC
  Administered 2020-12-04: 1000 ug via INTRAMUSCULAR

## 2020-12-04 NOTE — Progress Notes (Signed)
Patient presented for B 12 injection to right deltoid, patient voiced no concerns nor showed any signs of distress during injection. 

## 2020-12-04 NOTE — Telephone Encounter (Signed)
Pt requesting pain medication for knee pain. Cannot see ortho until June. Scheduled for appt with Dr Nicki Reaper.

## 2020-12-05 ENCOUNTER — Ambulatory Visit (INDEPENDENT_AMBULATORY_CARE_PROVIDER_SITE_OTHER): Payer: Medicare HMO | Admitting: Internal Medicine

## 2020-12-05 ENCOUNTER — Encounter: Payer: Self-pay | Admitting: Internal Medicine

## 2020-12-05 DIAGNOSIS — M25561 Pain in right knee: Secondary | ICD-10-CM

## 2020-12-05 DIAGNOSIS — E7800 Pure hypercholesterolemia, unspecified: Secondary | ICD-10-CM | POA: Diagnosis not present

## 2020-12-05 DIAGNOSIS — K5010 Crohn's disease of large intestine without complications: Secondary | ICD-10-CM

## 2020-12-05 DIAGNOSIS — I10 Essential (primary) hypertension: Secondary | ICD-10-CM | POA: Diagnosis not present

## 2020-12-05 MED ORDER — TRAMADOL HCL 50 MG PO TABS: 50.0000 mg | ORAL_TABLET | Freq: Two times a day (BID) | ORAL | 0 refills | Status: DC | PRN

## 2020-12-05 NOTE — Progress Notes (Signed)
Patient ID: Ellen Drake, female   DOB: April 02, 1946, 75 y.o.   MRN: [id number]   Subjective:    Patient ID: Ellen Drake, female    DOB: Oct 05, 1945, 75 y.o.   MRN: [id number]  HPI This visit occurred during the SARS-CoV-2 public health emergency.  Safety protocols were in place, including screening questions prior to the visit, additional usage of staff PPE, and extensive cleaning of exam room while observing appropriate contact time as indicated for disinfecting solutions.  Patient here for work in appt.  Work in to discuss increased pain - knee.  She has had persistent knee pain.  Has seen multiple specialist - multiple surgeries.  Was seeing Dr Gerrit Heck - he retired.  He was given her medication.  Last visit, she requested referral to pain clinic.  Has appt 01/14/21 but needs something to help with pain until her appt.  Taking tylenol.  Unable to take antiinflammatories.  States was given tramadol previously and this helped some.  Has been dealing with this for a few years.  Seeing [facility] for her shoulder.  S/p injection. Planning for MRI.  On no prescription pain medication.  States blood pressure averaging 349Z systolic.  No chest pain or sob reported.  No abdominal pain.  Bowels stable.   Past Medical History:  Diagnosis Date  . Anemia    reslved  . Colon polyp 2009  . Crohn's disease ([place])   . Diverticulosis   . Fibrocystic breast disease   . Herniated nucleus pulposus of lumbosacral region   . History of chicken pox   . Hypercholesterolemia   . Hyperlipidemia   . Hypertension   . Osteoarthritis   . Pulmonary nodule seen on imaging study 2004   resolved 2007   Past Surgical History:  Procedure Laterality Date  . anterior cervical discectomy and fusion  1998   Dr Rolin Barry  . BACK SURGERY  1992   ruptured disc (Dr Mauri Pole)  . BREAST BIOPSY Left    neg-no scar seen  . CATARACT EXTRACTION W/PHACO Right 02/09/2019   Procedure: CATARACT EXTRACTION PHACO AND INTRAOCULAR LENS  PLACEMENT ([place]) [place]  RIGHT HEALON 5, VISION BLUE;  Surgeon: Leandrew Koyanagi, MD;  Location: [place];  Service: Ophthalmology;  Laterality: Right;  . COLECTOMY  2003  . COLON SURGERY  1998  . COLONOSCOPY  2009  . COLONOSCOPY W/ BIOPSIES  11/30/2012   Procedure: COLONOSCOPY W/BIOPSY; Surgeon: Colvin Caroli, MD; Location: [place]; Service: Gastroenterology;;  . COLONOSCOPY W/ BIOPSIES  04/15/2016   Procedure: Colonoscopy ; Surgeon: Colvin Caroli, MD; Location: [place]; Service: Gastroenterology; Laterality: N/A;  . KNEE ARTHROSCOPY W/ SYNOVECTOMY Right 03/10/2014   limited  . KNEE SURGERY Right 1/16   [facility] (Dr. Sherlynn Carbon)  . POSTERIOR LAMINECTOMY / DECOMPRESSION LUMBAR SPINE  1999  . REPLACEMENT TOTAL KNEE    . ROTATOR CUFF REPAIR Left 2007   acute open; accident   Family History  Problem Relation Age of Onset  . CVA Mother   . Hypertension Mother   . Hypertension Sister        brain tumor  . Breast cancer Neg Hx   . Colon cancer Neg Hx    Social History   Socioeconomic History  . Marital status: Married    Spouse name: Not on file  . Number of children: 0  . Years of education: 6  . Highest education level: Not on file  Occupational History  .  Not on file  Tobacco Use  . Smoking status: Former Smoker    Quit date: 09/10/2009    Years since quitting: 11.2  . Smokeless tobacco: Never Used  Vaping Use  . Vaping Use: Never used  Substance and Sexual Activity  . Alcohol use: Yes    Alcohol/week: 1.0 standard drink    Types: 1 Glasses of wine per week    Comment: wine occasional; socially  . Drug use: No  . Sexual activity: Yes  Other Topics Concern  . Not on file  Social History Narrative  . Not on file   Social Determinants of Health   Financial Resource Strain: Not on file  Food Insecurity: Not on file  Transportation Needs: Not on file  Physical Activity: Not on file  Stress:  Not on file  Social Connections: Not on file    Outpatient Encounter Medications as of 12/05/2020  Medication Sig  . amLODipine (NORVASC) 10 MG tablet Take 1 tablet (10 mg total) by mouth daily.  . ASPIRIN 81 PO Take by mouth daily.  Marland Kitchen atenolol (TENORMIN) 50 MG tablet TAKE 1 TABLET BY MOUTH EVERY DAY  . brimonidine (ALPHAGAN) 0.2 % ophthalmic solution 1 drop 2 (two) times daily.   . clotrimazole-betamethasone (LOTRISONE) cream APPLY TO AFFECTED AREA TWICE A DAY  . gabapentin (NEURONTIN) 100 MG capsule Take 100 mg by mouth 3 (three) times daily.  Marland Kitchen latanoprost (XALATAN) 0.005 % ophthalmic solution Place 1 drop into both eyes at bedtime.   Marland Kitchen lisinopril (ZESTRIL) 40 MG tablet TAKE 1 TABLET BY MOUTH EVERY DAY  . nystatin cream (MYCOSTATIN) Apply 1 application topically 2 (two) times daily.  . rosuvastatin (CRESTOR) 10 MG tablet TAKE 1 TABLET BY MOUTH EVERY DAY  . sulfaSALAzine (AZULFIDINE) 500 MG tablet Take 1,500 mg by mouth 2 (two) times daily. 3 tabs  . timolol (TIMOPTIC) 0.5 % ophthalmic solution Place 1 drop into both eyes 2 (two) times daily.  . traMADol (ULTRAM) 50 MG tablet Take 1 tablet (50 mg total) by mouth 2 (two) times daily as needed.  . [DISCONTINUED] HYDROcodone-acetaminophen (NORCO/VICODIN) 5-325 MG tablet Take 1 tablet by mouth every 6 (six) hours as needed for moderate pain. (Patient not taking: Reported on 12/05/2020)  . [DISCONTINUED] HYDROmorphone (DILAUDID) 4 MG tablet TAKE 1 EVERY 6 HOURS FOR PAIN MAY FILL ON 05/04/19 (Patient not taking: Reported on 12/05/2020)  . [DISCONTINUED] traMADol (ULTRAM) 50 MG tablet Take 50 mg by mouth every 4 (four) hours as needed. (Patient not taking: Reported on 12/05/2020)   No facility-administered encounter medications on file as of 12/05/2020.    Review of Systems  Constitutional: Negative for appetite change and unexpected weight change.  HENT: Negative for congestion and sinus pressure.   Respiratory: Negative for cough, chest tightness  and shortness of breath.   Cardiovascular: Negative for chest pain and palpitations.  Gastrointestinal: Negative for abdominal pain, diarrhea, nausea and vomiting.  Genitourinary: Negative for difficulty urinating and dysuria.  Musculoskeletal: Negative for myalgias.       Increased knee pain and swelling.  Chronic swelling.    Skin: Negative for color change and rash.  Neurological: Negative for dizziness, light-headedness and headaches.  Psychiatric/Behavioral: Negative for agitation and dysphoric mood.       Objective:    Physical Exam Vitals reviewed.  Constitutional:      General: She is not in acute distress.    Appearance: Normal appearance.  HENT:     Head: Normocephalic and atraumatic.  Right Ear: External ear normal.     Left Ear: External ear normal.  Eyes:     General: No scleral icterus.       Right eye: No discharge.        Left eye: No discharge.     Conjunctiva/sclera: Conjunctivae normal.  Neck:     Thyroid: No thyromegaly.  Cardiovascular:     Rate and Rhythm: Normal rate and regular rhythm.  Pulmonary:     Effort: No respiratory distress.     Breath sounds: Normal breath sounds. No wheezing.  Abdominal:     General: Bowel sounds are normal.     Palpations: Abdomen is soft.     Tenderness: There is no abdominal tenderness.  Musculoskeletal:     Cervical back: Neck supple. No tenderness.     Comments: Increased swelling - right knee.  (chronic).  Increased pain - with weight bearing/flexion.    Lymphadenopathy:     Cervical: No cervical adenopathy.  Skin:    Findings: No erythema or rash.  Neurological:     Mental Status: She is alert.  Psychiatric:        Mood and Affect: Mood normal.        Behavior: Behavior normal.     BP 122/74   Pulse (!) 59   Temp (!) 97 F (36.1 C)   Resp 16   Ht 5' 6"  (1.676 m)   Wt 148 lb (67.1 kg)   LMP 08/13/1995   SpO2 97%   BMI 23.89 kg/m  Wt Readings from Last 3 Encounters:  12/05/20 148 lb (67.1 kg)   10/30/20 149 lb 4 oz (67.7 kg)  07/16/20 150 lb (68 kg)     Lab Results  Component Value Date   WBC 8.1 07/25/2020   HGB 12.5 07/25/2020   HCT 37.0 07/25/2020   PLT 175.0 07/25/2020   GLUCOSE 85 10/30/2020   CHOL 151 10/30/2020   TRIG 149.0 10/30/2020   HDL 61.90 10/30/2020   LDLDIRECT 132.5 05/06/2013   LDLCALC 60 10/30/2020   ALT 16 10/30/2020   AST 23 10/30/2020   NA 144 10/30/2020   K 3.7 10/30/2020   CL 107 10/30/2020   CREATININE 0.79 10/30/2020   BUN 15 10/30/2020   CO2 30 10/30/2020   TSH 2.19 07/25/2020   HGBA1C 5.7 10/19/2012    MM 3D SCREEN BREAST BILATERAL  Result Date: 11/12/2020 CLINICAL DATA:  Screening. EXAM: DIGITAL SCREENING BILATERAL MAMMOGRAM WITH TOMOSYNTHESIS AND CAD TECHNIQUE: Bilateral screening digital craniocaudal and mediolateral oblique mammograms were obtained. Bilateral screening digital breast tomosynthesis was performed. The images were evaluated with computer-aided detection. COMPARISON:  Previous exam(s). ACR Breast Density Category b: There are scattered areas of fibroglandular density. FINDINGS: There are no findings suspicious for malignancy. The images were evaluated with computer-aided detection. IMPRESSION: No mammographic evidence of malignancy. A result letter of this screening mammogram will be mailed directly to the patient. RECOMMENDATION: Screening mammogram in one year. (Code:SM-B-01Y) BI-RADS CATEGORY  1: Negative. Electronically Signed   By: Lillia Mountain M.D.   On: 11/12/2020 16:44       Assessment & Plan:   Problem List Items Addressed This Visit    Crohn's disease ([place])    Stable.  Continue sulfasalazine.  Followed by Dr Redmond Pulling.       Hypercholesterolemia    Continue crestor.  Follow lipid panel and liver function tests.       Hypertension    Continue lisinopril, amlodipine and atenolol.  Blood  pressure as outlined.  Follow pressures.  Follow metabolic panel.       Knee pain    Persistent increased pain.  Has seen  multiple specialist and had several procedures. No relief.  Chronic pain and swelling.  Not a surgical candidate now.  Dr Gerrit Heck retired.  Requested referral to [facility] pain clinic last visit.  Has appt 01/14/21.  Needs pain medication to get her to the appt.  Tramadol has helped some.  Can continue tylenol.  rx given for tramadol - until can get to her pain clinic appt.  Follow.           Einar Pheasant, MD

## 2020-12-09 ENCOUNTER — Encounter: Payer: Self-pay | Admitting: Internal Medicine

## 2020-12-09 NOTE — Assessment & Plan Note (Signed)
Continue lisinopril, amlodipine and atenolol.  Blood pressure as outlined.  Follow pressures.  Follow metabolic panel.

## 2020-12-09 NOTE — Assessment & Plan Note (Addendum)
Persistent increased pain.  Has seen multiple specialist and had several procedures. No relief.  Chronic pain and swelling.  Not a surgical candidate now.  Dr Gerrit Heck retired.  Requested referral to [facility] pain clinic last visit.  Has appt 01/14/21.  Needs pain medication to get her to the appt.  Tramadol has helped some.  Can continue tylenol.  rx given for tramadol - until can get to her pain clinic appt.  Follow.

## 2020-12-09 NOTE — Assessment & Plan Note (Signed)
Continue crestor.  Follow lipid panel and liver function tests.

## 2020-12-09 NOTE — Assessment & Plan Note (Signed)
Stable.  Continue sulfasalazine.  Followed by Dr Redmond Pulling.

## 2020-12-11 DIAGNOSIS — H401123 Primary open-angle glaucoma, left eye, severe stage: Secondary | ICD-10-CM | POA: Diagnosis not present

## 2020-12-21 DIAGNOSIS — L932 Other local lupus erythematosus: Secondary | ICD-10-CM | POA: Diagnosis not present

## 2021-01-04 ENCOUNTER — Ambulatory Visit (INDEPENDENT_AMBULATORY_CARE_PROVIDER_SITE_OTHER): Payer: Medicare HMO

## 2021-01-04 ENCOUNTER — Other Ambulatory Visit: Payer: Self-pay

## 2021-01-04 DIAGNOSIS — E538 Deficiency of other specified B group vitamins: Secondary | ICD-10-CM

## 2021-01-04 MED ORDER — CYANOCOBALAMIN 1000 MCG/ML IJ SOLN
1000.0000 ug | Freq: Once | INTRAMUSCULAR | Status: AC
  Administered 2021-01-04: 1000 ug via INTRAMUSCULAR

## 2021-01-04 NOTE — Progress Notes (Signed)
Patient presented for B 12 injection to right deltoid, patient voiced no concerns nor showed any signs of distress during injection. 

## 2021-01-13 DIAGNOSIS — G894 Chronic pain syndrome: Secondary | ICD-10-CM | POA: Insufficient documentation

## 2021-01-13 DIAGNOSIS — Z79899 Other long term (current) drug therapy: Secondary | ICD-10-CM | POA: Insufficient documentation

## 2021-01-13 DIAGNOSIS — Z789 Other specified health status: Secondary | ICD-10-CM | POA: Insufficient documentation

## 2021-01-13 DIAGNOSIS — M899 Disorder of bone, unspecified: Secondary | ICD-10-CM | POA: Insufficient documentation

## 2021-01-13 DIAGNOSIS — N6019 Diffuse cystic mastopathy of unspecified breast: Secondary | ICD-10-CM | POA: Insufficient documentation

## 2021-01-13 NOTE — Progress Notes (Signed)
Patient: Ellen Drake [facility]  Service Category: E/M  Provider: Gaspar Cola, MD  DOB: [birth year]  DOS: 01/14/2021  Referring Provider: Einar Pheasant, MD  MRN: [id number]  Setting: Ambulatory outpatient  PCP: Einar Pheasant, MD  Type: New Patient  Specialty: Interventional Pain Management    Location: Office  Delivery: Face-to-face     Primary Reason(s) for Visit: Encounter for initial evaluation of one or more chronic problems (new to examiner) potentially causing chronic pain, and posing a threat to normal musculoskeletal function. (Level of risk: High) CC: Neck Pain  HPI  Ms. Boese is a 75 y.o. year old, female patient, who comes for the first time to our practice referred by Einar Pheasant, MD for our initial evaluation of her chronic pain. She has Hypertension; Hypercholesterolemia; Crohn's disease ([place]); Ear lesion; Arthritis; Chronic knee pain (Right); Health care maintenance; S/P revision of total knee (Right); Osteoarthritis of knee (Right); Vitamin B12 deficiency; Vaginal burning; Right leg swelling; Abdominal pain; Right shoulder pain; Skin lesion; Crohn's colitis ([place]); Disorder of the skin and subcutaneous tissue, unspecified; Fibrocystic breast disease in female; H/O adenomatous polyp of colon; Synovitis of knee; S/P left colectomy; History of unicompartmental knee replacement (Right); Therapeutic drug monitoring; Chronic pain syndrome; Pharmacologic therapy; Disorder of skeletal system; Problems influencing health status; 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 Today she comes in for evaluation of her Neck Pain  Pain Assessment: Location: Right Knee Radiating: Denies Onset: More than a month ago Duration:   Quality: Aching,Tingling,Constant Severity: 10-Worst pain ever/10 (subjective, self-reported pain score)  Effect on ADL: limits my daily activities Timing: Constant Modifying factors:  meds BP: (!) 153/59  HR: (!) 55  Onset and Duration: Date of onset: 07/2013 Cause of pain: Unknown Severity: No change since onset, NAS-11 at its worse: 10/10, NAS-11 at its best: 7/10 and NAS-11 now: 10/10 Timing: Morning and Night Aggravating Factors: Kneeling, Nerve blocks, Walking and Walking uphill Alleviating Factors: Medications and Using a brace Associated Problems: Pain that wakes patient up and Pain that does not allow patient to sleep Quality of Pain: Burning, Stabbing, Tender, Throbbing and Tingling Previous Examinations or Tests: Bone scan, MRI scan, Nerve block, X-rays and Nerve conduction test Previous Treatments: The patient denies .  According to the patient the primary area of pain is that of the right knee where she has had 3 prior surgeries including 2 total knee replacements.  She continues to have swelling in the area and her last surgery was approximately 2 to 3 years ago.  She has been trying to control the pain with tramadol 50 mg p.o. twice daily as well as gabapentin 100 mg p.o. at bedtime, both of which to help.  The patient refers previously having been treated by Dr. Holland Falling (physician managing the pain at [facility] in [facility]).  She describes having been dropped by him and then following up with Orvan Falconer, PA.  When asked, the patient indicated that the only thing that they did for her was to give her pain medication.  According to the patient the original surgery was done on 2014 by Dr. Allene Pyo, or apparently has retired.  She was then transferred to Dr. Evalee Jefferson Libelt whom apparently did another surgery around 2018 but also retired.  She was then transferred to Melida Gimenez at [facility] in [place] where she has also been seen by Dorthula Matas, [place].  [phone].  Physical  exam today shows decreased range of motion of the right knee with increase in the diameter of the knee with but when compared to the left knee.  No redness, no  increased temperature and no discharge.  Today we went over the possible treatment plan which included a diagnostic right genicular nerves block to figure out if her pain is coming from the knee joint or from her back.  The patient indicates currently not experiencing any type of low back pain, but she does admit to having had back surgery in the distant past.  I have explained to the patient that should she get numbness in the area of the knee with the diagnostic injection, but no decrease in the pain, then we may have to look into the possibility that this may be coming from the lower back.  I have explained to the patient the mechanism on how that can happen.  In addition, I also explained to the patient that should she get good short-term relief of pain, but no long-term benefit, then we will consider radiofrequency ablation.  Today I took the time to provide the patient with information regarding my pain practice. The patient was informed that my practice is divided into two sections: an interventional pain management section, as well as a completely separate and distinct medication management section. I explained that I have procedure days for my interventional therapies, and evaluation days for follow-ups and medication management. Because of the amount of documentation required during both, they are kept separated. This means that there is the possibility that she may be scheduled for a procedure on one day, and medication management the next. I have also informed her that because of staffing and facility limitations, I no longer take patients for medication management only. To illustrate the reasons for this, I gave the patient the example of surgeons, and how inappropriate it would be to refer a patient to his/her care, just to write for the post-surgical antibiotics on a surgery done by a different surgeon.   Because interventional pain management is my board-certified specialty, the patient was  informed that joining my practice means that they are open to any and all interventional therapies. I made it clear that this does not mean that they will be forced to have any procedures done. What this means is that I believe interventional therapies to be essential part of the diagnosis and proper management of chronic pain conditions. Therefore, patients not interested in these interventional alternatives will be better served under the care of a different practitioner.  The patient was also made aware of my Comprehensive Pain Management Safety Guidelines where by joining my practice, they limit all of their nerve blocks and joint injections to those done by our practice, for as long as we are retained to manage their care.   Historic Controlled Substance Pharmacotherapy Review  PMP and historical list of controlled substances: Tramadol 50 mg; hydrocodone/APAP 5/325; hydromorphone 4 mg Current opioid analgesics:  Tramadol 50 mg 1 tab p.o. twice daily (100 mg/day of tramadol) (10 MME) MME/day: 10 mg/day  Historical Monitoring: The patient  reports no history of drug use. List of all UDS Test(s): No results found for: MDMA, COCAINSCRNUR, [place], [place], CANNABQUANT, THCU, [place] List of other Serum/Urine Drug Screening Test(s):  No results found for: AMPHSCRSER, BARBSCRSER, BENZOSCRSER, COCAINSCRSER, COCAINSCRNUR, PCPSCRSER, PCPQUANT, THCSCRSER, THCU, CANNABQUANT, OPIATESCRSER, OXYSCRSER, PROPOXSCRSER, ETH Historical Background Evaluation: [place] PMP: PDMP reviewed during this encounter. Online review of the past 36-monthperiod conducted.  PMP NARX Score Report:  Narcotic: 300 Sedative: 130 Stimulant: 000 [place] Department of public safety, offender search: Editor, commissioning Information) Non-contributory Risk Assessment Profile: Aberrant behavior: None observed or detected today Risk factors for fatal opioid overdose: None identified today PMP NARX Overdose Risk Score: 190 Fatal overdose  hazard ratio (HR): Calculation deferred Non-fatal overdose hazard ratio (HR): Calculation deferred Risk of opioid abuse or dependence: 0.7-3.0% with doses ? 36 MME/day and 6.1-26% with doses ? 120 MME/day. Substance use disorder (SUD) risk level: See below Personal History of Substance Abuse (SUD-Substance use disorder):  Alcohol: Negative  Illegal Drugs: Negative  Rx Drugs: Negative  ORT Risk Level calculation: Low Risk  Opioid Risk Tool - 01/14/21 1256      Family History of Substance Abuse   Alcohol Negative    Illegal Drugs Negative    Rx Drugs Negative      Personal History of Substance Abuse   Alcohol Negative    Illegal Drugs Negative    Rx Drugs Negative      Age   Age between 82-45 years  No      History of Preadolescent Sexual Abuse   History of Preadolescent Sexual Abuse Negative or Female      Psychological Disease   Psychological Disease Negative    Depression Negative      Total Score   Opioid Risk Tool Scoring 0    Opioid Risk Interpretation Low Risk          ORT Scoring interpretation table:  Score <3 = Low Risk for SUD  Score between 4-7 = Moderate Risk for SUD  Score >8 = High Risk for Opioid Abuse   PHQ-2 Depression Scale:  Total score:    PHQ-2 Scoring interpretation table: (Score and probability of major depressive disorder)  Score 0 = No depression  Score 1 = 15.4% Probability  Score 2 = 21.1% Probability  Score 3 = 38.4% Probability  Score 4 = 45.5% Probability  Score 5 = 56.4% Probability  Score 6 = 78.6% Probability   PHQ-9 Depression Scale:  Total score:    PHQ-9 Scoring interpretation table:  Score 0-4 = No depression  Score 5-9 = Mild depression  Score 10-14 = Moderate depression  Score 15-19 = Moderately severe depression  Score 20-27 = Severe depression (2.4 times higher risk of SUD and 2.89 times higher risk of overuse)   Pharmacologic Plan: As per protocol, I have not taken over any controlled substance management, pending  the results of ordered tests and/or consults.            Initial impression: Pending review of available data and ordered tests.  Meds   Current Outpatient Medications:  .  amLODipine (NORVASC) 10 MG tablet, Take 1 tablet (10 mg total) by mouth daily., Disp: 90 tablet, Rfl: 1 .  ASPIRIN 81 PO, Take by mouth daily., Disp: , Rfl:  .  atenolol (TENORMIN) 50 MG tablet, TAKE 1 TABLET BY MOUTH EVERY DAY, Disp: 90 tablet, Rfl: 1 .  brimonidine (ALPHAGAN) 0.2 % ophthalmic solution, 1 drop 2 (two) times daily. , Disp: , Rfl:  .  clotrimazole-betamethasone (LOTRISONE) cream, APPLY TO AFFECTED AREA TWICE A DAY, Disp: 15 g, Rfl: 1 .  gabapentin (NEURONTIN) 100 MG capsule, Take 100 mg by mouth 3 (three) times daily. 115m only at night, Disp: , Rfl:  .  latanoprost (XALATAN) 0.005 % ophthalmic solution, Place 1 drop into both eyes at bedtime. , Disp: , Rfl:  .  lisinopril (ZESTRIL)  40 MG tablet, TAKE 1 TABLET BY MOUTH EVERY DAY, Disp: 90 tablet, Rfl: 1 .  nystatin cream (MYCOSTATIN), Apply 1 application topically 2 (two) times daily., Disp: 30 g, Rfl: 0 .  rosuvastatin (CRESTOR) 10 MG tablet, TAKE 1 TABLET BY MOUTH EVERY DAY, Disp: 90 tablet, Rfl: 1 .  sulfaSALAzine (AZULFIDINE) 500 MG tablet, Take 1,500 mg by mouth 2 (two) times daily. 3 tabs, Disp: , Rfl:  .  timolol (TIMOPTIC) 0.5 % ophthalmic solution, Place 1 drop into both eyes 2 (two) times daily., Disp: , Rfl: 3 .  traMADol (ULTRAM) 50 MG tablet, Take 1 tablet (50 mg total) by mouth 2 (two) times daily as needed. (Patient not taking: Reported on 01/14/2021), Disp: 60 tablet, Rfl: 0  Imaging Review  Shoulder Imaging: Shoulder-R DG: Results for orders placed during the hospital encounter of 03/16/20 DG Shoulder Right  Narrative CLINICAL DATA:  Persistent right shoulder pain.  No known injury.  EXAM: RIGHT SHOULDER - 2+ VIEW  COMPARISON:  Chest x-ray 12/26/2013.  FINDINGS: Acromioclavicular and glenohumeral degenerative change.  Downsloping acromion with prominent subacromial spurring. Right shoulder is high-riding suggesting chronic rotator cuff tear. No acute bony abnormality. No evidence of fracture or dislocation. Prior cervical spine fusion degenerative change thoracic spine.  IMPRESSION: Acromioclavicular and glenohumeral degenerative change. Downsloping acromion with prominent subacromial spurring. Right shoulder is high-riding suggesting chronic rotator cuff tear. No acute abnormality identified.  Electronically Signed By: Marcello Moores  Register On: 03/16/2020 13:22  Knee Imaging: Knee-R DG 1-2 views: Results for orders placed in visit on 10/30/20 DG Knee 1-2 Views Right  Narrative CLINICAL DATA:  Right knee pain and swelling, no known injury, initial encounter  EXAM: RIGHT KNEE - 1-2 VIEW  COMPARISON:  None.  FINDINGS: Right knee prosthesis is seen in satisfactory position. No acute abnormality is noted. No joint effusion is seen.  IMPRESSION: Status post knee replacement.  No acute abnormality noted.  Electronically Signed By: Inez Catalina M.D. On: 10/31/2020 15:00  Complexity Note: Imaging results reviewed. Results shared with Ms. Nanetta Batty, using Layman's terms.                        ROS  Cardiovascular: Daily Aspirin intake and High blood pressure Pulmonary or Respiratory: Snoring  Neurological: No reported neurological signs or symptoms such as seizures, abnormal skin sensations, urinary and/or fecal incontinence, being born with an abnormal open spine and/or a tethered spinal cord Psychological-Psychiatric: No reported psychological or psychiatric signs or symptoms such as difficulty sleeping, anxiety, depression, delusions or hallucinations (schizophrenial), mood swings (bipolar disorders) or suicidal ideations or attempts Gastrointestinal: No reported gastrointestinal signs or symptoms such as vomiting or evacuating blood, reflux, heartburn, alternating episodes of diarrhea and  constipation, inflamed or scarred liver, or pancreas or irrregular and/or infrequent bowel movements Genitourinary: No reported renal or genitourinary signs or symptoms such as difficulty voiding or producing urine, peeing blood, non-functioning kidney, kidney stones, difficulty emptying the bladder, difficulty controlling the flow of urine, or chronic kidney disease Hematological: No reported hematological signs or symptoms such as prolonged bleeding, low or poor functioning platelets, bruising or bleeding easily, hereditary bleeding problems, low energy levels due to low hemoglobin or being anemic Endocrine: No reported endocrine signs or symptoms such as high or low blood sugar, rapid heart rate due to high thyroid levels, obesity or weight gain due to slow thyroid or thyroid disease Rheumatologic: Rheumatoid arthritis Musculoskeletal: Negative for myasthenia gravis, muscular dystrophy, multiple sclerosis or malignant hyperthermia  Work History: Retired  Allergies  Ms. Marcott is allergic to mercaptopurine, lyrica [pregabalin], and dilaudid [hydromorphone hcl].  Laboratory Chemistry Profile   Renal Lab Results  Component Value Date   BUN 15 10/30/2020   CREATININE 0.79 10/30/2020   GFR 73.41 10/30/2020     Electrolytes Lab Results  Component Value Date   NA 144 10/30/2020   K 3.7 10/30/2020   CL 107 10/30/2020   CALCIUM 9.7 10/30/2020     Hepatic Lab Results  Component Value Date   AST 23 10/30/2020   ALT 16 10/30/2020   ALBUMIN 4.5 10/30/2020   ALKPHOS 73 10/30/2020     ID Lab Results  Component Value Date   SARSCOV2NAA NOT DETECTED 02/04/2019     Bone No results found for: VD25OH, ZO109UE4VWU, JW1191YN8, GN5621HY8, 25OHVITD1, 25OHVITD2, 25OHVITD3, TESTOFREE, TESTOSTERONE   Endocrine Lab Results  Component Value Date   GLUCOSE 85 10/30/2020   GLUCOSEU 100 (A) 05/18/2019   HGBA1C 5.7 10/19/2012   TSH 2.19 07/25/2020   FREET4 0.88 02/14/2014     Neuropathy Lab  Results  Component Value Date   VITAMINB12 289 07/25/2020   HGBA1C 5.7 10/19/2012     CNS No results found for: COLORCSF, APPEARCSF, RBCCOUNTCSF, WBCCSF, POLYSCSF, LYMPHSCSF, EOSCSF, PROTEINCSF, GLUCCSF, JCVIRUS, CSFOLI, IGGCSF, LABACHR, ACETBL, LABACHR, ACETBL   Inflammation (CRP: Acute  ESR: Chronic) Lab Results  Component Value Date   CRP 1.2 10/19/2012   ESRSEDRATE 29 (H) 10/19/2012     Rheumatology Lab Results  Component Value Date   RF 36 (H) 10/19/2012   ANA POS (A) 10/19/2012     Coagulation Lab Results  Component Value Date   PLT 175.0 07/25/2020     Cardiovascular Lab Results  Component Value Date   HGB 12.5 07/25/2020   HCT 37.0 07/25/2020     Screening Lab Results  Component Value Date   SARSCOV2NAA NOT DETECTED 02/04/2019   COVIDSOURCE NASOPHARYNGEAL 02/04/2019     Cancer No results found for: CEA, CA125, LABCA2   Allergens No results found for: ALMOND, APPLE, ASPARAGUS, AVOCADO, BANANA, BARLEY, BASIL, BAYLEAF, GREENBEAN, LIMABEAN, WHITEBEAN, BEEFIGE, REDBEET, BLUEBERRY, BROCCOLI, CABBAGE, MELON, CARROT, CASEIN, CASHEWNUT, CAULIFLOWER, CELERY     Note: Lab results reviewed.  PFSH  Drug: Ms. Siedlecki  reports no history of drug use. Alcohol:  reports current alcohol use of about 1.0 standard drink of alcohol per week. Tobacco:  reports that she quit smoking about 11 years ago. She has never used smokeless tobacco. Medical:  has a past medical history of Anemia, Colon polyp (2009), Crohn's disease ([place]), Diverticulosis, Fibrocystic breast disease, Herniated nucleus pulposus of lumbosacral region, History of chicken pox, Hypercholesterolemia, Hyperlipidemia, Hypertension, Osteoarthritis, and Pulmonary nodule seen on imaging study (2004). Family: family history includes CVA in her mother; Hypertension in her mother and sister.  Past Surgical History:  Procedure Laterality Date  . anterior cervical discectomy and fusion  1998   Dr Rolin Barry  . BACK  SURGERY  1992   ruptured disc (Dr Mauri Pole)  . BREAST BIOPSY Left    neg-no scar seen  . CATARACT EXTRACTION W/PHACO Right 02/09/2019   Procedure: CATARACT EXTRACTION PHACO AND INTRAOCULAR LENS PLACEMENT ([place]) [place]  RIGHT HEALON 5, VISION BLUE;  Surgeon: Leandrew Koyanagi, MD;  Location: [facility];  Service: Ophthalmology;  Laterality: Right;  . COLECTOMY  2003  . COLON SURGERY  1998  . COLONOSCOPY  2009  . COLONOSCOPY W/ BIOPSIES  11/30/2012   Procedure: COLONOSCOPY W/BIOPSY; Surgeon: Mechele Claude  A Alwyn Ren, MD; Location: [facility]; Service: Gastroenterology;;  . COLONOSCOPY W/ BIOPSIES  04/15/2016   Procedure: Colonoscopy ; Surgeon: Colvin Caroli, MD; Location: [facility]; Service: Gastroenterology; Laterality: N/A;  . KNEE ARTHROSCOPY W/ SYNOVECTOMY Right 03/10/2014   limited  . KNEE SURGERY Right 1/16   [facility] (Dr. Sherlynn Carbon)  . POSTERIOR LAMINECTOMY / DECOMPRESSION LUMBAR SPINE  1999  . REPLACEMENT TOTAL KNEE    . ROTATOR CUFF REPAIR Left 2007   acute open; accident   Active Ambulatory Problems    Diagnosis Date Noted  . Hypertension 08/12/2012  . Hypercholesterolemia 08/12/2012  . Crohn's disease ([place]) 08/12/2012  . Ear lesion 05/08/2013  . Arthritis 05/08/2013  . Chronic knee pain (Right) 06/15/2014  . Health care maintenance 10/02/2014  . 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  . Osteoarthritis of knee (Right) 03/23/2017  . Vitamin B12 deficiency 12/17/2015  . Vaginal burning 05/15/2019  . Right leg swelling 05/18/2019  . Abdominal pain 05/18/2019  . Right shoulder pain 03/16/2020  . Skin lesion 03/17/2020  . Crohn's colitis ([place]) 01/25/2002  . Disorder of the skin and subcutaneous tissue, unspecified 06/15/2014  . Fibrocystic breast disease in female 01/13/2021  . H/O adenomatous polyp of colon 11/29/2012  . Synovitis of knee 03/16/2014  . S/P left colectomy 01/26/2003  . History of  unicompartmental knee replacement (Right) 06/15/2014  . Therapeutic drug monitoring 01/26/2003  . Chronic pain syndrome 01/13/2021  . Pharmacologic therapy 01/13/2021  . Disorder of skeletal system 01/13/2021  . Problems influencing health status 01/13/2021  . 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 Pain and swelling of knee (Right) 01/14/2021   Resolved Ambulatory Problems    Diagnosis Date Noted  . URI (upper respiratory infection) 06/26/2013   Past Medical History:  Diagnosis Date  . Anemia   . Colon polyp 2009  . Diverticulosis   . Fibrocystic breast disease   . Herniated nucleus pulposus of lumbosacral region   . History of chicken pox   . Hyperlipidemia   . Osteoarthritis   . Pulmonary nodule seen on imaging study 2004   Constitutional Exam  General appearance: Well nourished, well developed, and well hydrated. In no apparent acute distress Vitals:   01/14/21 1247  BP: (!) 153/59  Pulse: (!) 55  Temp: (!) 97.2 F (36.2 C)  SpO2: 99%  Weight: 145 lb (65.8 kg)  Height: _0  (1.676 m)   BMI Assessment: Estimated body mass index is 23.4 kg/m as calculated from the following:   Height as of this encounter: _1  (1.676 m).   Weight as of this encounter: 145 lb (65.8 kg).  BMI interpretation table: BMI level Category Range association with higher incidence of chronic pain  <18 kg/m2 Underweight   18.5-24.9 kg/m2 Ideal body weight   25-29.9 kg/m2 Overweight Increased incidence by 20%  30-34.9 kg/m2 Obese (Class I) Increased incidence by 68%  35-39.9 kg/m2 Severe obesity (Class II) Increased incidence by 136%  >40 kg/m2 Extreme obesity (Class III) Increased incidence by 254%   Patient's current BMI Ideal Body weight  Body mass index is 23.4 kg/m. Ideal body weight: 59.3 kg (130 lb 11.7 oz) Adjusted ideal body weight: 61.9 kg (136 lb 7 oz)   BMI Readings from Last 4 Encounters:  01/14/21 23.40 kg/m  12/05/20 23.89 kg/m   10/30/20 24.09 kg/m  07/16/20 24.21 kg/m   Wt Readings from Last 4 Encounters:  01/14/21 145 lb (  65.8 kg)  12/05/20 148 lb (67.1 kg)  10/30/20 149 lb 4 oz (67.7 kg)  07/16/20 150 lb (68 kg)    Psych/Mental status: Alert, oriented x 3 (person, place, & time)       Eyes: PERLA Respiratory: No evidence of acute respiratory distress  Assessment  Primary Diagnosis & Pertinent Problem List: The primary encounter diagnosis was 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 Diagnoses of Chronic knee pain (Right), Pain and swelling of knee (Right), S/P revision of total knee (Right), Chronic pain syndrome, Pharmacologic therapy, Disorder of skeletal system, Problems influencing health status, and Vitamin B12 deficiency were also pertinent to this visit.  Visit Diagnosis (New problems to examiner): 1. 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 Chronic knee pain (Right)   3. Pain and swelling of knee (Right)   4. S/P revision of total knee (Right)   5. Chronic pain syndrome   6. Pharmacologic therapy   7. Disorder of skeletal system   8. Problems influencing health status   9. Vitamin B12 deficiency    Plan of Care (Initial workup plan)  Note: Ms. Gaba was reminded that as per protocol, today's visit has been an evaluation only. We have not taken over the patient's controlled substance management.  Problem-specific plan: No problem-specific Assessment & Plan notes found for this encounter.   Lab Orders     Compliance Drug Analysis, Ur     Magnesium     Vitamin B12     Sedimentation rate     25-Hydroxy vitamin D Lcms D2+D3     C-reactive protein     CBC with Differential/Platelet  Imaging Orders     MR KNEE RIGHT WO CONTRAST Referral Orders  No referral(s) requested today    Procedure Orders     GENICULAR NERVE BLOCK Pharmacotherapy (current): Medications ordered:  No orders of the defined types were placed in this  encounter.  Medications administered during this visit: Arelis D. Vadala had no medications administered during this visit.   Pharmacological management options:  Opioid Analgesics: The patient was informed that there is no guarantee that she would be a candidate for opioid analgesics. The decision will be made following CDC guidelines. This decision will be based on the results of diagnostic studies, as well as Ms. Mahr's risk profile.   Membrane stabilizer: To be determined at a later time  Muscle relaxant: To be determined at a later time  NSAID: To be determined at a later time  Other analgesic(s): To be determined at a later time   Interventional management options: Ms. Bobe was informed that there is no guarantee that she would be a candidate for interventional therapies. The decision will be based on the results of diagnostic studies, as well as Ms. Mallis's risk profile.  Procedure(s) under consideration:  Diagnostic right genicular nerves block #1  Possible right genicular nerves RFA #1    Provider-requested follow-up: Return in about 2 weeks (around 01/28/2021) for procedure day, 2nd visit (40 min) + procedure (w/sedation): (R) Genicular NB #1.  Future Appointments  Date Time Provider [place]  01/22/2021 12:00 PM Einar Pheasant, MD [facility]  02/05/2021 10:15 AM [facility]  02/25/2021  8:20 AM Milinda Pointer, MD [facility] None  07/17/2021 11:00 AM O'Brien-Blaney, Denisa L, LPN [facility]    Note by: Gaspar Cola, MD Date: 01/14/2021; Time: 2:24 PM

## 2021-01-14 ENCOUNTER — Other Ambulatory Visit: Payer: Self-pay

## 2021-01-14 ENCOUNTER — Encounter: Payer: Self-pay | Admitting: Pain Medicine

## 2021-01-14 ENCOUNTER — Other Ambulatory Visit
Admission: RE | Admit: 2021-01-14 | Discharge: 2021-01-14 | Disposition: A | Discharge status: Home / Self Care | Payer: Medicare HMO | Attending: Pain Medicine | Admitting: Pain Medicine

## 2021-01-14 ENCOUNTER — Ambulatory Visit: Payer: Medicare HMO | Admitting: Pain Medicine

## 2021-01-14 VITALS — BP 153/59 | HR 55 | Temp 97.2°F | Ht 66.0 in | Wt 145.0 lb

## 2021-01-14 DIAGNOSIS — G894 Chronic pain syndrome: Secondary | ICD-10-CM | POA: Insufficient documentation

## 2021-01-14 DIAGNOSIS — Z96651 Presence of right artificial knee joint: Secondary | ICD-10-CM | POA: Insufficient documentation

## 2021-01-14 DIAGNOSIS — Z79899 Other long term (current) drug therapy: Secondary | ICD-10-CM | POA: Insufficient documentation

## 2021-01-14 DIAGNOSIS — M899 Disorder of bone, unspecified: Secondary | ICD-10-CM

## 2021-01-14 DIAGNOSIS — M25561 Pain in right knee: Secondary | ICD-10-CM | POA: Insufficient documentation

## 2021-01-14 DIAGNOSIS — M25461 Effusion, right knee: Secondary | ICD-10-CM

## 2021-01-14 DIAGNOSIS — E538 Deficiency of other specified B group vitamins: Secondary | ICD-10-CM | POA: Insufficient documentation

## 2021-01-14 DIAGNOSIS — Z7982 Long term (current) use of aspirin: Secondary | ICD-10-CM | POA: Insufficient documentation

## 2021-01-14 DIAGNOSIS — G8929 Other chronic pain: Secondary | ICD-10-CM | POA: Insufficient documentation

## 2021-01-14 DIAGNOSIS — Z789 Other specified health status: Secondary | ICD-10-CM | POA: Insufficient documentation

## 2021-01-14 LAB — CBC WITH DIFFERENTIAL/PLATELET
Abs Immature Granulocytes: 0.04 10*3/uL (ref 0.00–0.07)
Basophils Absolute: 0 10*3/uL (ref 0.0–0.1)
Basophils Relative: 0 %
Eosinophils Absolute: 0.1 10*3/uL (ref 0.0–0.5)
Eosinophils Relative: 2 %
HCT: 38.2 % (ref 36.0–46.0)
Hemoglobin: 12.8 g/dL (ref 12.0–15.0)
Immature Granulocytes: 1 %
Lymphocytes Relative: 24 %
Lymphs Abs: 2.1 10*3/uL (ref 0.7–4.0)
MCH: 32.4 pg (ref 26.0–34.0)
MCHC: 33.5 g/dL (ref 30.0–36.0)
MCV: 96.7 fL (ref 80.0–100.0)
Monocytes Absolute: 0.7 10*3/uL (ref 0.1–1.0)
Monocytes Relative: 8 %
Neutro Abs: 5.6 10*3/uL (ref 1.7–7.7)
Neutrophils Relative %: 65 %
Platelets: 170 10*3/uL (ref 150–400)
RBC: 3.95 MIL/uL (ref 3.87–5.11)
RDW: 12.8 % (ref 11.5–15.5)
WBC: 8.5 10*3/uL (ref 4.0–10.5)
nRBC: 0 % (ref 0.0–0.2)

## 2021-01-14 LAB — VITAMIN B12: Vitamin B-12: 418 pg/mL (ref 180–914)

## 2021-01-14 LAB — URINE DRUG SCREEN, QUALITATIVE (ARMC ONLY)
Amphetamines, Ur Screen: NOT DETECTED
Barbiturates, Ur Screen: NOT DETECTED
Benzodiazepine, Ur Scrn: NOT DETECTED
Cannabinoid 50 Ng, Ur ~~LOC~~: NOT DETECTED
Cocaine Metabolite,Ur ~~LOC~~: NOT DETECTED
MDMA (Ecstasy)Ur Screen: NOT DETECTED
Methadone Scn, Ur: NOT DETECTED
Opiate, Ur Screen: NOT DETECTED
Phencyclidine (PCP) Ur S: NOT DETECTED
Tricyclic, Ur Screen: NOT DETECTED

## 2021-01-14 LAB — C-REACTIVE PROTEIN: CRP: 3 mg/dL — ABNORMAL HIGH (ref <1.0)

## 2021-01-14 LAB — VITAMIN D 25 HYDROXY (VIT D DEFICIENCY, FRACTURES): Vit D, 25-Hydroxy: 8.33 ng/mL — ABNORMAL LOW (ref 30–100)

## 2021-01-14 LAB — SEDIMENTATION RATE: Sed Rate: 31 mm/hr — ABNORMAL HIGH (ref 0–30)

## 2021-01-14 LAB — MAGNESIUM: Magnesium: 2 mg/dL (ref 1.7–2.4)

## 2021-01-14 NOTE — Patient Instructions (Addendum)
____________________________________________________________________________________________  Preparing for Procedure with Sedation  Procedure appointments are limited to planned procedures: . No Prescription Refills. . No disability issues will be discussed. . No medication changes will be discussed.  Instructions: . Oral Intake: Do not eat or drink anything for at least 8 hours prior to your procedure. (Exception: Blood Pressure Medication. See below.) . Transportation: Unless otherwise stated by your physician, you may drive yourself after the procedure. . Blood Pressure Medicine: Do not forget to take your blood pressure medicine with a sip of water the morning of the procedure. If your Diastolic (lower reading)is above 100 mmHg, elective cases will be cancelled/rescheduled. . Blood thinners: These will need to be stopped for procedures. Notify our staff if you are taking any blood thinners. Depending on which one you take, there will be specific instructions on how and when to stop it. . Diabetics on insulin: Notify the staff so that you can be scheduled 1st case in the morning. If your diabetes requires high dose insulin, take only  of your normal insulin dose the morning of the procedure and notify the staff that you have done so. . Preventing infections: Shower with an antibacterial soap the morning of your procedure. . Build-up your immune system: Take 1000 mg of Vitamin C with every meal (3 times a day) the day prior to your procedure. Marland Kitchen Antibiotics: Inform the staff if you have a condition or reason that requires you to take antibiotics before dental procedures. . Pregnancy: If you are pregnant, call and cancel the procedure. . Sickness: If you have a cold, fever, or any active infections, call and cancel the procedure. . Arrival: You must be in the facility at least 30 minutes prior to your scheduled procedure. . Children: Do not bring children with you. . Dress appropriately:  Bring dark clothing that you would not mind if they get stained. . Valuables: Do not bring any jewelry or valuables.  Reasons to call and reschedule or cancel your procedure: (Following these recommendations will minimize the risk of a serious complication.) . Surgeries: Avoid having procedures within 2 weeks of any surgery. (Avoid for 2 weeks before or after any surgery). . Flu Shots: Avoid having procedures within 2 weeks of a flu shots or . (Avoid for 2 weeks before or after immunizations). . Barium: Avoid having a procedure within 7-10 days after having had a radiological study involving the use of radiological contrast. (Myelograms, Barium swallow or enema study). . Heart attacks: Avoid any elective procedures or surgeries for the initial 6 months after a "Myocardial Infarction" (Heart Attack). . Blood thinners: It is imperative that you stop these medications before procedures. Let us know if you if you take any blood thinner.  . Infection: Avoid procedures during or within two weeks of an infection (including chest colds or gastrointestinal problems). Symptoms associated with infections include: Localized redness, fever, chills, night sweats or profuse sweating, burning sensation when voiding, cough, congestion, stuffiness, runny nose, sore throat, diarrhea, nausea, vomiting, cold or Flu symptoms, recent or current infections. It is specially important if the infection is over the area that we intend to treat. Marland Kitchen Heart and lung problems: Symptoms that may suggest an active cardiopulmonary problem include: cough, chest pain, breathing difficulties or shortness of breath, dizziness, ankle swelling, uncontrolled high or unusually low blood pressure, and/or palpitations. If you are experiencing any of these symptoms, cancel your procedure and contact your primary care physician for an evaluation.  Remember:  Regular Business hours are:  Monday to Thursday 8:00 AM to 4:00 PM  Provider's  Schedule: Milinda Pointer, MD:  Procedure days: Tuesday and Thursday 7:30 AM to 4:00 PM  Gillis Santa, MD:  Procedure days: Monday and Wednesday 7:30 AM to 4:00 PM ____________________________________________________________________________________________  ____________________________________________________________________________________________  General Risks and Possible Complications  Patient Responsibilities: It is important that you read this as it is part of your informed consent. It is our duty to inform you of the risks and possible complications associated with treatments offered to you. It is your responsibility as a patient to read this and to ask questions about anything that is not clear or that you believe was not covered in this document.  Patient's Rights: You have the right to refuse treatment. You also have the right to change your mind, even after initially having agreed to have the treatment done. However, under this last option, if you wait until the last second to change your mind, you may be charged for the materials used up to that point.  Introduction: Medicine is not an Chief Strategy Officer. Everything in Medicine, including the lack of treatment(s), carries the potential for danger, harm, or loss (which is by definition: Risk). In Medicine, a complication is a secondary problem, condition, or disease that can aggravate an already existing one. All treatments carry the risk of possible complications. The fact that a side effects or complications occurs, does not imply that the treatment was conducted incorrectly. It must be clearly understood that these can happen even when everything is done following the highest safety standards.  No treatment: You can choose not to proceed with the proposed treatment alternative. The "PRO(s)" would include: avoiding the risk of complications associated with the therapy. The "CON(s)" would include: not getting any of the treatment  benefits. These benefits fall under one of three categories: diagnostic; therapeutic; and/or palliative. Diagnostic benefits include: getting information which can ultimately lead to improvement of the disease or symptom(s). Therapeutic benefits are those associated with the successful treatment of the disease. Finally, palliative benefits are those related to the decrease of the primary symptoms, without necessarily curing the condition (example: decreasing the pain from a flare-up of a chronic condition, such as incurable terminal cancer).  General Risks and Complications: These are associated to most interventional treatments. They can occur alone, or in combination. They fall under one of the following six (6) categories: no benefit or worsening of symptoms; bleeding; infection; nerve damage; allergic reactions; and/or death. 1. No benefits or worsening of symptoms: In Medicine there are no guarantees, only probabilities. No healthcare provider can ever guarantee that a medical treatment will work, they can only state the probability that it may. Furthermore, there is always the possibility that the condition may worsen, either directly, or indirectly, as a consequence of the treatment. 2. Bleeding: This is more common if the patient is taking a blood thinner, either prescription or over the counter (example: Goody Powders, Fish oil, Aspirin, Garlic, etc.), or if suffering a condition associated with impaired coagulation (example: Hemophilia, cirrhosis of the liver, low platelet counts, etc.). However, even if you do not have one on these, it can still happen. If you have any of these conditions, or take one of these drugs, make sure to notify your treating physician. 3. Infection: This is more common in patients with a compromised immune system, either due to disease (example: diabetes, cancer, human immunodeficiency virus [HIV], etc.), or due to medications or treatments (example: therapies used to treat  cancer and rheumatological  diseases). However, even if you do not have one on these, it can still happen. If you have any of these conditions, or take one of these drugs, make sure to notify your treating physician. 4. Nerve Damage: This is more common when the treatment is an invasive one, but it can also happen with the use of medications, such as those used in the treatment of cancer. The damage can occur to small secondary nerves, or to large primary ones, such as those in the spinal cord and brain. This damage may be temporary or permanent and it may lead to impairments that can range from temporary numbness to permanent paralysis and/or brain death. 5. Allergic Reactions: Any time a substance or material comes in contact with our body, there is the possibility of an allergic reaction. These can range from a mild skin rash (contact dermatitis) to a severe systemic reaction (anaphylactic reaction), which can result in death. 6. Death: In general, any medical intervention can result in death, most of the time due to an unforeseen complication. ____________________________________________________________________________________________

## 2021-01-14 NOTE — Progress Notes (Signed)
Safety precautions to be maintained throughout the outpatient stay will include: orient to surroundings, keep bed in low position, maintain call bell within reach at all times, provide assistance with transfer out of bed and ambulation.  

## 2021-01-15 ENCOUNTER — Ambulatory Visit: Payer: Medicare HMO | Admitting: Internal Medicine

## 2021-01-17 ENCOUNTER — Ambulatory Visit (HOSPITAL_BASED_OUTPATIENT_CLINIC_OR_DEPARTMENT_OTHER): Payer: Medicare HMO | Admitting: Pain Medicine

## 2021-01-17 ENCOUNTER — Other Ambulatory Visit: Payer: Self-pay

## 2021-01-17 ENCOUNTER — Ambulatory Visit
Admission: RE | Admit: 2021-01-17 | Discharge: 2021-01-17 | Disposition: A | Discharge status: Home / Self Care | Payer: Medicare HMO | Source: Ambulatory Visit | Attending: Pain Medicine | Admitting: Pain Medicine

## 2021-01-17 VITALS — BP 159/71 | HR 59 | Temp 97.2°F | Resp 18 | Ht 66.0 in | Wt 145.0 lb

## 2021-01-17 DIAGNOSIS — M25461 Effusion, right knee: Secondary | ICD-10-CM | POA: Diagnosis not present

## 2021-01-17 DIAGNOSIS — M1711 Unilateral primary osteoarthritis, right knee: Secondary | ICD-10-CM

## 2021-01-17 DIAGNOSIS — Z96651 Presence of right artificial knee joint: Secondary | ICD-10-CM | POA: Diagnosis not present

## 2021-01-17 DIAGNOSIS — G8929 Other chronic pain: Secondary | ICD-10-CM

## 2021-01-17 DIAGNOSIS — M25561 Pain in right knee: Secondary | ICD-10-CM | POA: Insufficient documentation

## 2021-01-17 DIAGNOSIS — E559 Vitamin D deficiency, unspecified: Secondary | ICD-10-CM | POA: Insufficient documentation

## 2021-01-17 MED ORDER — LACTATED RINGERS IV SOLN: 1000.0000 mL | Freq: Once | INTRAVENOUS | Status: DC

## 2021-01-17 MED ORDER — LIDOCAINE HCL 2 % IJ SOLN
20.0000 mL | Freq: Once | INTRAMUSCULAR | Status: AC
Start: 2021-01-17 — End: 2021-01-17
  Administered 2021-01-17: 400 mg

## 2021-01-17 MED ORDER — METHYLPREDNISOLONE ACETATE 40 MG/ML IJ SUSP
40.0000 mg | Freq: Once | INTRAMUSCULAR | Status: AC
  Administered 2021-01-17: 40 mg
  Filled 2021-01-17: qty 1

## 2021-01-17 MED ORDER — LIDOCAINE HCL (PF) 2 % IJ SOLN
INTRAMUSCULAR | Status: AC
  Filled 2021-01-17: qty 5

## 2021-01-17 MED ORDER — FENTANYL CITRATE (PF) 100 MCG/2ML IJ SOLN
25.0000 ug | INTRAMUSCULAR | Status: DC | PRN
Start: 2021-01-17 — End: 2021-01-17

## 2021-01-17 MED ORDER — MIDAZOLAM HCL 5 MG/5ML IJ SOLN: 1.0000 mg | INTRAMUSCULAR | Status: DC | PRN

## 2021-01-17 MED ORDER — ROPIVACAINE HCL 2 MG/ML IJ SOLN
INTRAMUSCULAR | Status: AC
  Filled 2021-01-17: qty 20

## 2021-01-17 MED ORDER — ROPIVACAINE HCL 2 MG/ML IJ SOLN
9.0000 mL | Freq: Once | INTRAMUSCULAR | Status: AC
  Administered 2021-01-17: 9 mL

## 2021-01-17 MED ORDER — METHYLPREDNISOLONE ACETATE 80 MG/ML IJ SUSP
INTRAMUSCULAR | Status: AC
  Filled 2021-01-17: qty 1

## 2021-01-17 NOTE — Progress Notes (Signed)
Safety precautions to be maintained throughout the outpatient stay will include: orient to surroundings, keep bed in low position, maintain call bell within reach at all times, provide assistance with transfer out of bed and ambulation.  

## 2021-01-17 NOTE — Patient Instructions (Signed)

## 2021-01-17 NOTE — Progress Notes (Signed)
PROVIDER NOTE: Information contained herein reflects review and annotations entered in association with encounter. Interpretation of such information and data should be left to medically-trained personnel. Information provided to patient can be located elsewhere in the medical record under "Patient Instructions". Document created using STT-dictation technology, any transcriptional errors that may result from process are unintentional.    Patient: Ellen Drake  Service Category: Procedure  Provider: Gaspar Cola, MD  DOB: [birth year]  DOS: 01/17/2021  Location: [facility]  MRN: [id number]  Setting: Ambulatory - outpatient  Referring Provider: Einar Pheasant, MD  Type: Established Patient  Specialty: Interventional Pain Management  PCP: Einar Pheasant, MD   Primary Reason for Visit: Interventional Pain Management Treatment. CC: Knee Pain (right)  Procedure:          Anesthesia, Analgesia, Anxiolysis:  Type: Genicular Nerves Block (Superolateral, Superomedial, and Inferomedial Genicular Nerves)          CPT: 63875      Primary Purpose: Diagnostic Region: Lateral, Anterior, and Medial aspects of the knee joint, above and below the knee joint proper. Level: Superior and inferior to the knee joint. Target Area: For Genicular Nerve block(s), the targets are: the superolateral genicular nerve, located in the lateral distal portion of the femoral shaft as it curves to form the lateral epicondyle, in the region of the distal femoral metaphysis; the superomedial genicular nerve, located in the medial distal portion of the femoral shaft as it curves to form the medial epicondyle; and the inferomedial genicular nerve, located in the medial, proximal portion of the tibial shaft, as it curves to form the medial epicondyle, in the region of the proximal tibial metaphysis. Approach: Anterior, percutaneous, ipsilateral approach. Laterality: Right knee  Type: Moderate (Conscious) Sedation  combined with Local Anesthesia Indication(s): Analgesia and Anxiety Route: Intravenous (IV) IV Access: Secured Sedation: Meaningful verbal contact was maintained at all times during the procedure  Local Anesthetic: Lidocaine 1-2%  Position: Modified Fowler's position with pillows under the targeted knee(s).   Indications: 1. Chronic knee pain (Right)   2. 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 Osteoarthritis of knee (Right)   4. Pain and swelling of knee (Right)    Pain Score: Pre-procedure: 0-No pain/10 Post-procedure: 0-No pain/10   Pre-op H&P Assessment:  Ellen Drake is a 75 y.o. (year old), female patient, seen today for interventional treatment. She  has a past surgical history that includes Back surgery (1992); anterior cervical discectomy and fusion (1998); Knee surgery (Right, 1/16); Colonoscopy (2009); Colon surgery (1998); Posterior laminectomy / decompression lumbar spine (1999); Rotator cuff repair (Left, 2007); Colonoscopy w/ biopsies (11/30/2012); Colectomy (2003); Replacement total knee; Knee arthroscopy w/ synovectomy (Right, 03/10/2014); Colonoscopy w/ biopsies (04/15/2016); Cataract extraction w/PHACO (Right, 02/09/2019); and Breast biopsy (Left). Ellen Drake has a current medication list which includes the following prescription(s): amlodipine, aspirin, atenolol, brimonidine, clotrimazole-betamethasone, gabapentin, latanoprost, lisinopril, nystatin cream, rosuvastatin, sulfasalazine, timolol, and tramadol, and the following Facility-Administered Medications: fentanyl, lactated ringers, and midazolam. Her primarily concern today is the Knee Pain (right)  Initial Vital Signs:  Pulse/HCG Rate: 61  Temp: (!) 97.2 F (36.2 C) Resp: 14 BP: (!) 156/78 SpO2: 100 %  BMI: Estimated body mass index is 23.4 kg/m as calculated from the following:   Height as of this encounter: 5' 6"  (1.676 m).   Weight as of this encounter: 145 lb (65.8 kg).  Risk  Assessment: Allergies: Reviewed. She is allergic to mercaptopurine, lyrica [pregabalin], and dilaudid [  hydromorphone hcl].  Allergy Precautions: None required Coagulopathies: Reviewed. None identified.  Blood-thinner therapy: None at this time Active Infection(s): Reviewed. None identified. Ellen Drake is afebrile  Site Confirmation: Ellen Drake was asked to confirm the procedure and laterality before marking the site Procedure checklist: Completed Consent: Before the procedure and under the influence of no sedative(s), amnesic(s), or anxiolytics, the patient was informed of the treatment options, risks and possible complications. To fulfill our ethical and legal obligations, as recommended by the American Medical Association's Code of Ethics, I have informed the patient of my clinical impression; the nature and purpose of the treatment or procedure; the risks, benefits, and possible complications of the intervention; the alternatives, including doing nothing; the risk(s) and benefit(s) of the alternative treatment(s) or procedure(s); and the risk(s) and benefit(s) of doing nothing. The patient was provided information about the general risks and possible complications associated with the procedure. These may include, but are not limited to: failure to achieve desired goals, infection, bleeding, organ or nerve damage, allergic reactions, paralysis, and death. In addition, the patient was informed of those risks and complications associated to the procedure, such as failure to decrease pain; infection; bleeding; organ or nerve damage with subsequent damage to sensory, motor, and/or autonomic systems, resulting in permanent pain, numbness, and/or weakness of one or several areas of the body; allergic reactions; (i.e.: anaphylactic reaction); and/or death. Furthermore, the patient was informed of those risks and complications associated with the medications. These include, but are not limited to: allergic  reactions (i.e.: anaphylactic or anaphylactoid reaction(s)); adrenal axis suppression; blood sugar elevation that in diabetics may result in ketoacidosis or comma; water retention that in patients with history of congestive heart failure may result in shortness of breath, pulmonary edema, and decompensation with resultant heart failure; weight gain; swelling or edema; medication-induced neural toxicity; particulate matter embolism and blood vessel occlusion with resultant organ, and/or nervous system infarction; and/or aseptic necrosis of one or more joints. Finally, the patient was informed that Medicine is not an exact science; therefore, there is also the possibility of unforeseen or unpredictable risks and/or possible complications that may result in a catastrophic outcome. The patient indicated having understood very clearly. We have given the patient no guarantees and we have made no promises. Enough time was given to the patient to ask questions, all of which were answered to the patient's satisfaction. Ms. Whitelock has indicated that she wanted to continue with the procedure. Attestation: I, the ordering provider, attest that I have discussed with the patient the benefits, risks, side-effects, alternatives, likelihood of achieving goals, and potential problems during recovery for the procedure that I have provided informed consent. Date  Time: 01/17/2021 10:42 AM  Pre-Procedure Preparation:  Monitoring: As per clinic protocol. Respiration, ETCO2, SpO2, BP, heart rate and rhythm monitor placed and checked for adequate function Safety Precautions: Patient was assessed for positional comfort and pressure points before starting the procedure. Time-out: I initiated and conducted the "Time-out" before starting the procedure, as per protocol. The patient was asked to participate by confirming the accuracy of the "Time Out" information. Verification of the correct person, site, and procedure were performed and  confirmed by me, the nursing staff, and the patient. "Time-out" conducted as per Joint Commission's Universal Protocol (UP.01.01.01). Time: 1115  Description of Procedure:          Area Prepped: Entire knee area, from mid-thigh to mid-shin, lateral, anterior, and medial aspects. DuraPrep (Iodine Povacrylex [0.7% available iodine] and Isopropyl Alcohol, 74%  w/w) Safety Precautions: Aspiration looking for blood return was conducted prior to all injections. At no point did we inject any substances, as a needle was being advanced. No attempts were made at seeking any paresthesias. Safe injection practices and needle disposal techniques used. Medications properly checked for expiration dates. SDV (single dose vial) medications used. Description of the Procedure: Protocol guidelines were followed. The patient was placed in position over the procedure table. The target area was identified and the area prepped in the usual manner. Skin & deeper tissues infiltrated with local anesthetic. Appropriate amount of time allowed to pass for local anesthetics to take effect. The procedure needles were then advanced to the target area. Proper needle placement secured. Negative aspiration confirmed. Solution injected in intermittent fashion, asking for systemic symptoms every 0.5cc of injectate. The needles were then removed and the area cleansed, making sure to leave some of the prepping solution back to take advantage of its long term bactericidal properties.  Vitals:   01/17/21 1041 01/17/21 1105 01/17/21 1115 01/17/21 1125  BP: (!) 156/78 140/70 (!) 154/72 (!) 159/71  Pulse: 61 (!) 57 (!) 57 (!) 59  Resp: 14 18 20 18   Temp: (!) 97.2 F (36.2 C)     TempSrc: Temporal     SpO2: 100% 98% 100% 100%  Weight: 145 lb (65.8 kg)     Height: 5' 6"  (1.676 m)       Start Time: 1115 hrs. End Time: 1123 hrs. Materials:  Needle(s) Type: Spinal Needle Gauge: 22G Length: 3.5-in Medication(s): Please see orders for  medications and dosing details.  Imaging Guidance (Non-Spinal):          Type of Imaging Technique: Fluoroscopy Guidance (Non-Spinal) Indication(s): Assistance in needle guidance and placement for procedures requiring needle placement in or near specific anatomical locations not easily accessible without such assistance. Exposure Time: Please see nurses notes. Contrast: None used. Fluoroscopic Guidance: I was personally present during the use of fluoroscopy. "Tunnel Vision Technique" used to obtain the best possible view of the target area. Parallax error corrected before commencing the procedure. "Direction-depth-direction" technique used to introduce the needle under continuous pulsed fluoroscopy. Once target was reached, antero-posterior, oblique, and lateral fluoroscopic projection used confirm needle placement in all planes. Images permanently stored in EMR. Interpretation: No contrast injected. I personally interpreted the imaging intraoperatively. Adequate needle placement confirmed in multiple planes. Permanent images saved into the patient's record.  Antibiotic Prophylaxis:   Anti-infectives (From admission, onward)    None      Indication(s): None identified  Post-operative Assessment:  Post-procedure Vital Signs:  Pulse/HCG Rate: (!) 59 (sb)  Temp: (!) 97.2 F (36.2 C) Resp: 18 BP: (!) 159/71 SpO2: 100 %  EBL: None  Complications: No immediate post-treatment complications observed by team, or reported by patient.  Note: The patient tolerated the entire procedure well. A repeat set of vitals were taken after the procedure and the patient was kept under observation following institutional policy, for this type of procedure. Post-procedural neurological assessment was performed, showing return to baseline, prior to discharge. The patient was provided with post-procedure discharge instructions, including a section on how to identify potential problems. Should any problems  arise concerning this procedure, the patient was given instructions to immediately contact us, at any time, without hesitation. In any case, we plan to contact the patient by telephone for a follow-up status report regarding this interventional procedure.  Comments:  No additional relevant information.  Plan of Care  Orders:  Orders  Placed This Encounter  Procedures   GENICULAR NERVE BLOCK    Scheduling Instructions:     Side(s): Right Knee     Level(s): Superior-Lateral, Superior-Medial, and Inferior-Medial Genicular Nerves     Sedation: Patient's choice.     Timeframe: Today    Order Specific Question:   Where will this procedure be performed?    Answer:   [facility]   DG [facility] C-ARM 1-60 MIN NO REPORT    Intraoperative interpretation by procedural physician at [place].    Standing Status:   Standing    Number of Occurrences:   1    Order Specific Question:   Reason for exam:    Answer:   Assistance in needle guidance and placement for procedures requiring needle placement in or near specific anatomical locations not easily accessible without such assistance.   Informed Consent Details: Physician/Practitioner Attestation; Transcribe to consent form and obtain patient signature    Note: Always confirm laterality of pain with Ms. Nanetta Drake, before procedure. Transcribe to consent form and obtain patient signature.    Order Specific Question:   Physician/Practitioner attestation of informed consent for procedure/surgical case    Answer:   I, the physician/practitioner, attest that I have discussed with the patient the benefits, risks, side effects, alternatives, likelihood of achieving goals and potential problems during recovery for the procedure that I have provided informed consent.    Order Specific Question:   Procedure    Answer:   Block of the genicular nerves of the knee (Genicular Nerves Block)    Order Specific Question:   Physician/Practitioner  performing the procedure    Answer:   Eagle Pitta A. Dossie Arbour, MD    Order Specific Question:   Indication/Reason    Answer:   Chronic knee pain   Provide equipment / supplies at bedside    "Block Tray" (Disposable  single use) Needle type: SpinalSpinal Amount/quantity: 3 Size: Regular (3.5-inch) Gauge: 22G    Standing Status:   Standing    Number of Occurrences:   1    Order Specific Question:   Specify    Answer:   Block Tray    Chronic Opioid Analgesic:  Tramadol 50 mg 1 tab p.o. twice daily (100 mg/day of tramadol) (10 MME) MME/day: 10 mg/day   Medications ordered for procedure: Meds ordered this encounter  Medications   lidocaine (XYLOCAINE) 2 % (with pres) injection 400 mg   lactated ringers infusion 1,000 mL   midazolam (VERSED) 5 MG/5ML injection 1-2 mg    Make sure Flumazenil is available in the pyxis when using this medication. If oversedation occurs, administer 0.2 mg IV over 15 sec. If after 45 sec no response, administer 0.2 mg again over 1 min; may repeat at 1 min intervals; not to exceed 4 doses (1 mg)   fentaNYL (SUBLIMAZE) injection 25-50 mcg    Make sure Narcan is available in the pyxis when using this medication. In the event of respiratory depression (RR< 8/min): Titrate NARCAN (naloxone) in increments of 0.1 to 0.2 mg IV at 2-3 minute intervals, until desired degree of reversal.   ropivacaine (PF) 2 mg/mL (0.2%) (NAROPIN) injection 9 mL   methylPREDNISolone acetate (DEPO-MEDROL) injection 40 mg    Medications administered: We administered lidocaine, ropivacaine (PF) 2 mg/mL (0.2%), and methylPREDNISolone acetate.  See the medical record for exact dosing, route, and time of administration.  Follow-up plan:   Return in about 2 weeks (around 01/31/2021) for procedure day (afternoon F2F) (PPE).  Interventional Therapies  Risk  Complexity Considerations:   Estimated body mass index is 23.4 kg/m as calculated from the following:   Height as of this  encounter: 5' 6"  (1.676 m).   Weight as of this encounter: 145 lb (65.8 kg). WNL   Planned  Pending:   Pending further evaluation   Under consideration:   Diagnostic right genicular nerves block #1  Possible right genicular nerves RFA #1    Completed:   None at this time   Therapeutic  Palliative (PRN) options:   None established    Recent Visits Date Type Provider Dept  01/14/21 Office Visit Milinda Pointer, MD [facility]  Showing recent visits within past 90 days and meeting all other requirements Today's Visits Date Type Provider Dept  01/17/21 Procedure visit Milinda Pointer, MD [facility]  Showing today's visits and meeting all other requirements Future Appointments Date Type Provider Dept  01/31/21 Appointment Milinda Pointer, MD [facility]  02/25/21 Appointment Milinda Pointer, MD [facility]  Showing future appointments within next 90 days and meeting all other requirements Disposition: Discharge home  Discharge (Date  Time): 01/17/2021; 1128 hrs.   Primary Care Physician: Einar Pheasant, MD Location: [facility] Note by: Gaspar Cola, MD Date: 01/17/2021; Time: 4:41 PM  Disclaimer:  Medicine is not an Chief Strategy Officer. The only guarantee in medicine is that nothing is guaranteed. It is important to note that the decision to proceed with this intervention was based on the information collected from the patient. The Data and conclusions were drawn from the patient's questionnaire, the interview, and the physical examination. Because the information was provided in large part by the patient, it cannot be guaranteed that it has not been purposely or unconsciously manipulated. Every effort has been made to obtain as much relevant data as possible for this evaluation. It is important to note that the conclusions that lead to this procedure are derived in large part from the available  data. Always take into account that the treatment will also be dependent on availability of resources and existing treatment guidelines, considered by other Pain Management Practitioners as being common knowledge and practice, at the time of the intervention. For Medico-Legal purposes, it is also important to point out that variation in procedural techniques and pharmacological choices are the acceptable norm. The indications, contraindications, technique, and results of the above procedure should only be interpreted and judged by a Board-Certified Interventional Pain Specialist with extensive familiarity and expertise in the same exact procedure and technique.

## 2021-01-17 NOTE — Progress Notes (Signed)
PROVIDER NOTE: Information contained herein reflects review and annotations entered in association with encounter. Interpretation of such information and data should be left to medically-trained personnel. Information provided to patient can be located elsewhere in the medical record under "Patient Instructions". Document created using STT-dictation technology, any transcriptional errors that may result from process are unintentional.    Patient: Ellen Drake Day Nanetta Batty  Service Category: E/M  Provider: Gaspar Cola, MD  DOB: 11-05-1945  DOS: 01/17/2021  Specialty: Interventional Pain Management  MRN: [id number]  Setting: Ambulatory outpatient  PCP: Einar Pheasant, MD  Type: Established Patient    Referring Provider: Einar Pheasant, MD  Location: Office  Delivery: Face-to-face     Primary Reason(s) for Visit: Encounter for evaluation before starting new chronic pain management plan of care (Level of risk: moderate) CC: Knee Pain (right)  HPI  Ellen Drake is a 75 y.o. year old, female patient, who comes today for a follow-up evaluation to review the test results and decide on a treatment plan. She has Hypertension; Hypercholesterolemia; Crohn's disease ([place]); Ear lesion; Arthritis; Chronic knee pain (Right); Health care maintenance; S/P revision of total knee (Right); Osteoarthritis of knee (Right); Vitamin B12 deficiency; Vaginal burning; Right leg swelling; Abdominal pain; Right shoulder pain; Skin lesion; Crohn's colitis ([place]); Disorder of the skin and subcutaneous tissue, unspecified; Fibrocystic breast disease in female; H/O adenomatous polyp of colon; Synovitis of knee; S/P left colectomy; History of unicompartmental knee replacement (Right); Therapeutic drug monitoring; Chronic pain syndrome; Pharmacologic therapy; Disorder of skeletal system; Problems influencing health status; 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 Her primarily concern today is the Knee Pain (right)  Pain Assessment: Location: Right Knee Radiating: denies Onset: More than a month ago Duration:   Quality: Aching, Throbbing Severity: 0-No pain/10 (subjective, self-reported pain score)  Effect on ADL:   Timing: Constant Modifying factors: medications BP: (!) 159/71  HR: (!) 59 (sb)  Ellen Drake comes in today for a follow-up visit after her initial evaluation on 01/14/2021. Today we went over the results of her tests. These were explained in "Layman's terms". During today's appointment we went over my diagnostic impression, as well as the proposed treatment plan.  Review of initial evaluation: According to the patient the primary area of pain is that of the right knee where she has had 3 prior surgeries including 2 total knee replacements.  She continues to have swelling in the area and her last surgery was approximately 2 to 3 years ago.  She has been trying to control the pain with tramadol 50 mg p.o. twice daily as well as gabapentin 100 mg p.o. at bedtime, both of which to help.  The patient refers previously having been treated by Dr. Holland Falling (physician managing the pain at [facility] in [facility]).  She describes having been dropped by him and then following up with Orvan Falconer, PA.  When asked, the patient indicated that the only thing that they did for her was to give her pain medication.   According to the patient the original surgery was done on 2014 by Dr. Allene Pyo, or apparently has retired.  She was then transferred to Dr. Evalee Jefferson Libelt whom apparently did another surgery around 2018 but also retired.  She was then transferred to Melida Gimenez at [facility] in [place] where she has also been seen by Dorthula Matas, [place].  [phone].   Physical  exam today shows decreased range of motion of the right knee with increase in the diameter of the knee with but when compared to the  left knee.  No redness, no increased temperature and no discharge.  In considering the treatment plan options, Ellen Drake was reminded that I no longer take patients for medication management only. I asked her to let me know if she had no intention of taking advantage of the interventional therapies, so that we could make arrangements to provide this space to someone interested. I also made it clear that undergoing interventional therapies for the purpose of getting pain medications is very inappropriate on the part of a patient, and it will not be tolerated in this practice. This type of behavior would suggest true addiction and therefore it requires referral to an addiction specialist.   Further details on both, my assessment(s), as well as the proposed treatment plan, please see below.  Controlled Substance Pharmacotherapy Assessment REMS (Risk Evaluation and Mitigation Strategy)  Analgesic: Tramadol 50 mg 1 tab p.o. twice daily (100 mg/day of tramadol) (10 MME) MME/day: 10 mg/day  Pill Count: None expected due to no prior prescriptions written by our practice. Landis Martins, RN  01/17/2021 10:44 AM  Sign when Signing Visit Safety precautions to be maintained throughout the outpatient stay will include: orient to surroundings, keep bed in low position, maintain call bell within reach at all times, provide assistance with transfer out of bed and ambulation.    Pharmacokinetics: Liberation and absorption (onset of action): WNL Distribution (time to peak effect): WNL Metabolism and excretion (duration of action): WNL         Pharmacodynamics: Desired effects: Analgesia: Ms. Duchesne reports >50% benefit. Functional ability: Patient reports that medication allows her to accomplish basic ADLs Clinically meaningful improvement in function (CMIF): Sustained CMIF goals met Perceived effectiveness: Described as relatively effective, allowing for increase in activities of daily living  (ADL) Undesirable effects: Side-effects or Adverse reactions: None reported Monitoring: [place] PMP: PDMP not reviewed this encounter. Online review of the past 42-monthperiod previously conducted. Not applicable at this point since we have not taken over the patient's medication management yet. List of other Serum/Urine Drug Screening Test(s):  Lab Results  Component Value Date   COCAINSCRNUR NONE DETECTED 01/14/2021   THCU NONE DETECTED 01/14/2021   List of all UDS test(s) done:  No results found for: TOXASSSELUR, SUMMARY Last UDS on record: No results found for: TOXASSSELUR, SUMMARY UDS interpretation: No unexpected findings.          Medication Assessment Form: Patient introduced to form today Treatment compliance: Treatment may start today if patient agrees with proposed plan. Evaluation of compliance is not applicable at this point Risk Assessment Profile: Aberrant behavior: See initial evaluations. None observed or detected today Comorbid factors increasing risk of overdose: See initial evaluation. No additional risks detected today Opioid risk tool (ORT):  Opioid Risk  01/14/2021  Alcohol 0  Illegal Drugs 0  Rx Drugs 0  Alcohol 0  Illegal Drugs 0  Rx Drugs 0  Age between 16-45 years  0  History of Preadolescent Sexual Abuse 0  Psychological Disease 0  Depression 0  Opioid Risk Tool Scoring 0  Opioid Risk Interpretation Low Risk    ORT Scoring interpretation table:  Score <3 = Low Risk for SUD  Score between 4-7 = Moderate Risk for SUD  Score >8 = High Risk for Opioid Abuse   Risk of substance use disorder (SUD): Low  Risk Mitigation Strategies:  Patient opioid safety counseling: Completed today. Counseling provided to patient as per "Patient Counseling Document". Document signed by patient, attesting to counseling and understanding Patient-Prescriber Agreement (PPA): Obtained today.  Controlled substance notification to other providers: Written and sent  today.  Pharmacologic Plan: Today we may be taking over the patient's pharmacological regimen. See below.             Laboratory Chemistry Profile   Renal Lab Results  Component Value Date   BUN 15 10/30/2020   CREATININE 0.79 10/30/2020   GFR 73.41 10/30/2020     Electrolytes Lab Results  Component Value Date   NA 144 10/30/2020   K 3.7 10/30/2020   CL 107 10/30/2020   CALCIUM 9.7 10/30/2020   MG 2.0 01/14/2021     Hepatic Lab Results  Component Value Date   AST 23 10/30/2020   ALT 16 10/30/2020   ALBUMIN 4.5 10/30/2020   ALKPHOS 73 10/30/2020     ID Lab Results  Component Value Date   SARSCOV2NAA NOT DETECTED 02/04/2019     Bone Lab Results  Component Value Date   VD25OH 8.33 (L) 01/14/2021     Endocrine Lab Results  Component Value Date   GLUCOSE 85 10/30/2020   GLUCOSEU 100 (A) 05/18/2019   HGBA1C 5.7 10/19/2012   TSH 2.19 07/25/2020   FREET4 0.88 02/14/2014     Neuropathy Lab Results  Component Value Date   VITAMINB12 418 01/14/2021   HGBA1C 5.7 10/19/2012     CNS No results found for: COLORCSF, APPEARCSF, RBCCOUNTCSF, WBCCSF, POLYSCSF, LYMPHSCSF, EOSCSF, PROTEINCSF, GLUCCSF, JCVIRUS, CSFOLI, IGGCSF, LABACHR, ACETBL, LABACHR, ACETBL   Inflammation (CRP: Acute  ESR: Chronic) Lab Results  Component Value Date   CRP 3.0 (H) 01/14/2021   ESRSEDRATE 31 (H) 01/14/2021     Rheumatology Lab Results  Component Value Date   RF 36 (H) 10/19/2012   ANA POS (A) 10/19/2012     Coagulation Lab Results  Component Value Date   PLT 170 01/14/2021     Cardiovascular Lab Results  Component Value Date   HGB 12.8 01/14/2021   HCT 38.2 01/14/2021     Screening Lab Results  Component Value Date   SARSCOV2NAA NOT DETECTED 02/04/2019   COVIDSOURCE NASOPHARYNGEAL 02/04/2019     Cancer No results found for: CEA, CA125, LABCA2   Allergens No results found for: ALMOND, APPLE, ASPARAGUS, AVOCADO, BANANA, BARLEY, BASIL, BAYLEAF, GREENBEAN,  LIMABEAN, WHITEBEAN, BEEFIGE, REDBEET, BLUEBERRY, BROCCOLI, CABBAGE, MELON, CARROT, CASEIN, CASHEWNUT, CAULIFLOWER, CELERY     Note: Lab results reviewed.  Recent Diagnostic Imaging Review  Shoulder Imaging: Shoulder-R DG: Results for orders placed during the hospital encounter of 03/16/20 DG Shoulder Right  Narrative CLINICAL DATA:  Persistent right shoulder pain.  No known injury.  EXAM: RIGHT SHOULDER - 2+ VIEW  COMPARISON:  Chest x-ray 12/26/2013.  FINDINGS: Acromioclavicular and glenohumeral degenerative change. Downsloping acromion with prominent subacromial spurring. Right shoulder is high-riding suggesting chronic rotator cuff tear. No acute bony abnormality. No evidence of fracture or dislocation. Prior cervical spine fusion degenerative change thoracic spine.  IMPRESSION: Acromioclavicular and glenohumeral degenerative change. Downsloping acromion with prominent subacromial spurring. Right shoulder is high-riding suggesting chronic rotator cuff tear. No acute abnormality identified.   Electronically Signed By: Marcello Moores  Register On: 03/16/2020 13:22  Shoulder-L DG: Results for orders placed in visit on 10/06/12  Knee Imaging: Knee-R DG 1-2 views: Results for orders placed in visit on 10/30/20 DG Knee 1-2 Views Right  Narrative  CLINICAL DATA:  Right knee pain and swelling, no known injury, initial encounter  EXAM: RIGHT KNEE - 1-2 VIEW  COMPARISON:  None.  FINDINGS: Right knee prosthesis is seen in satisfactory position. No acute abnormality is noted. No joint effusion is seen.  IMPRESSION: Status post knee replacement.  No acute abnormality noted.   Electronically Signed By: Inez Catalina M.D. On: 10/31/2020 15:00  Complexity Note: Imaging results reviewed. Results shared with Ms. Nanetta Batty, using Layman's terms.                        Meds   Current Outpatient Medications:    amLODipine (NORVASC) 10 MG tablet, Take 1 tablet (10 mg total) by  mouth daily., Disp: 90 tablet, Rfl: 1   ASPIRIN 81 PO, Take by mouth daily., Disp: , Rfl:    atenolol (TENORMIN) 50 MG tablet, TAKE 1 TABLET BY MOUTH EVERY DAY, Disp: 90 tablet, Rfl: 1   brimonidine (ALPHAGAN) 0.2 % ophthalmic solution, 1 drop 2 (two) times daily. , Disp: , Rfl:    clotrimazole-betamethasone (LOTRISONE) cream, APPLY TO AFFECTED AREA TWICE A DAY, Disp: 15 g, Rfl: 1   gabapentin (NEURONTIN) 100 MG capsule, Take 100 mg by mouth 3 (three) times daily. 134m only at night, Disp: , Rfl:    latanoprost (XALATAN) 0.005 % ophthalmic solution, Place 1 drop into both eyes at bedtime. , Disp: , Rfl:    lisinopril (ZESTRIL) 40 MG tablet, TAKE 1 TABLET BY MOUTH EVERY DAY, Disp: 90 tablet, Rfl: 1   nystatin cream (MYCOSTATIN), Apply 1 application topically 2 (two) times daily., Disp: 30 g, Rfl: 0   rosuvastatin (CRESTOR) 10 MG tablet, TAKE 1 TABLET BY MOUTH EVERY DAY, Disp: 90 tablet, Rfl: 1   sulfaSALAzine (AZULFIDINE) 500 MG tablet, Take 1,500 mg by mouth 2 (two) times daily. 3 tabs, Disp: , Rfl:    timolol (TIMOPTIC) 0.5 % ophthalmic solution, Place 1 drop into both eyes 2 (two) times daily., Disp: , Rfl: 3   traMADol (ULTRAM) 50 MG tablet, Take 1 tablet (50 mg total) by mouth 2 (two) times daily as needed. (Patient not taking: No sig reported), Disp: 60 tablet, Rfl: 0  Current Facility-Administered Medications:    fentaNYL (SUBLIMAZE) injection 25-50 mcg, 25-50 mcg, Intravenous, Q5 min PRN, NMilinda Pointer MD   lactated ringers infusion 1,000 mL, 1,000 mL, Intravenous, Once, NMilinda Pointer MD   midazolam (VERSED) 5 MG/5ML injection 1-2 mg, 1-2 mg, Intravenous, Q5 min PRN, NMilinda Pointer MD  ROS  Constitutional: Denies any fever or chills Gastrointestinal: No reported hemesis, hematochezia, vomiting, or acute GI distress Musculoskeletal: Denies any acute onset joint swelling, redness, loss of ROM, or weakness Neurological: No reported episodes of acute onset apraxia,  aphasia, dysarthria, agnosia, amnesia, paralysis, loss of coordination, or loss of consciousness  Allergies  Ellen Drake allergic to mercaptopurine, lyrica [pregabalin], and dilaudid [hydromorphone hcl].  PFSH  Drug: Ellen Drake reports no history of drug use. Alcohol:  reports current alcohol use of about 1.0 standard drink of alcohol per week. Tobacco:  reports that she quit smoking about 11 years ago. She has never used smokeless tobacco. Medical:  has a past medical history of Anemia, Colon polyp (2009), Crohn's disease ([place], Diverticulosis, Fibrocystic breast disease, Herniated nucleus pulposus of lumbosacral region, History of chicken pox, Hypercholesterolemia, Hyperlipidemia, Hypertension, Osteoarthritis, and Pulmonary nodule seen on imaging study (2004). Surgical: Ms. CLorah has a past surgical history that includes Back surgery (1992); anterior  cervical discectomy and fusion (1998); Knee surgery (Right, 1/16); Colonoscopy (2009); Colon surgery (1998); Posterior laminectomy / decompression lumbar spine (1999); Rotator cuff repair (Left, 2007); Colonoscopy w/ biopsies (11/30/2012); Colectomy (2003); Replacement total knee; Knee arthroscopy w/ synovectomy (Right, 03/10/2014); Colonoscopy w/ biopsies (04/15/2016); Cataract extraction w/PHACO (Right, 02/09/2019); and Breast biopsy (Left). Family: family history includes CVA in her mother; Hypertension in her mother and sister.  Constitutional Exam  General appearance: Well nourished, well developed, and well hydrated. In no apparent acute distress Vitals:   01/17/21 1041 01/17/21 1105 01/17/21 1115 01/17/21 1125  BP: (!) 156/78 140/70 (!) 154/72 (!) 159/71  Pulse: 61 (!) 57 (!) 57 (!) 59  Resp: 14 18 20 18   Temp: (!) 97.2 F (36.2 C)     TempSrc: Temporal     SpO2: 100% 98% 100% 100%  Weight: 145 lb (65.8 kg)     Height: 5' 6"  (1.676 m)      BMI Assessment: Estimated body mass index is 23.4 kg/m as calculated from the following:    Height as of this encounter: 5' 6"  (1.676 m).   Weight as of this encounter: 145 lb (65.8 kg).  BMI interpretation table: BMI level Category Range association with higher incidence of chronic pain  <18 kg/m2 Underweight   18.5-24.9 kg/m2 Ideal body weight   25-29.9 kg/m2 Overweight Increased incidence by 20%  30-34.9 kg/m2 Obese (Class I) Increased incidence by 68%  35-39.9 kg/m2 Severe obesity (Class II) Increased incidence by 136%  >40 kg/m2 Extreme obesity (Class III) Increased incidence by 254%   Patient's current BMI Ideal Body weight  Body mass index is 23.4 kg/m. Ideal body weight: 59.3 kg (130 lb 11.7 oz) Adjusted ideal body weight: 61.9 kg (136 lb 7 oz)   BMI Readings from Last 4 Encounters:  01/17/21 23.40 kg/m  01/14/21 23.40 kg/m  12/05/20 23.89 kg/m  10/30/20 24.09 kg/m   Wt Readings from Last 4 Encounters:  01/17/21 145 lb (65.8 kg)  01/14/21 145 lb (65.8 kg)  12/05/20 148 lb (67.1 kg)  10/30/20 149 lb 4 oz (67.7 kg)    Psych/Mental status: Alert, oriented x 3 (person, place, & time)       Eyes: PERLA Respiratory: No evidence of acute respiratory distress  Assessment & Plan  Primary Diagnosis & Pertinent Problem List: The primary encounter diagnosis was Chronic knee pain (Right). 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  Visit Diagnosis: 1. Chronic knee pain (Right)   2. 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 Osteoarthritis of knee (Right)   4. Pain and swelling of knee (Right)   5. Chronic pain of right knee   6. Chronic knee pain after total replacement of right knee joint   7. Primary osteoarthritis of right knee   8.  Pain and swelling of knee, right    Problems updated and reviewed during this visit: Problem  Vitamin D Deficiency    Plan of Care  Pharmacotherapy (Medications Ordered): Meds ordered this encounter  Medications   lidocaine (XYLOCAINE) 2 % (with pres) injection 400 mg   lactated ringers infusion 1,000 mL   midazolam (VERSED) 5 MG/5ML injection 1-2 mg    Make  sure Flumazenil is available in the pyxis when using this medication. If oversedation occurs, administer 0.2 mg IV over 15 sec. If after 45 sec no response, administer 0.2 mg again over 1 min; may repeat at 1 min intervals; not to exceed 4 doses (1 mg)   fentaNYL (SUBLIMAZE) injection 25-50 mcg    Make sure Narcan is available in the pyxis when using this medication. In the event of respiratory depression (RR< 8/min): Titrate NARCAN (naloxone) in increments of 0.1 to 0.2 mg IV at 2-3 minute intervals, until desired degree of reversal.   ropivacaine (PF) 2 mg/mL (0.2%) (NAROPIN) injection 9 mL   methylPREDNISolone acetate (DEPO-MEDROL) injection 40 mg    Procedure Orders  GENICULAR NERVE BLOCK   Lab Orders  No laboratory test(s) ordered today   Imaging Orders  DG PAIN CLINIC C-ARM 1-60 MIN NO REPORT   Referral Orders  No referral(s) requested today    Pharmacological management options:  Opioid Analgesics: We'll take over management today. See above orders Membrane stabilizer: Options discussed, including a trial. Muscle relaxant: We have discussed the possibility of a trial NSAID: Trial discussed. Other analgesic(s): To be determined at a later time    Interventional Therapies  Risk  Complexity Considerations:   Estimated body mass index is 23.4 kg/m as calculated from the following:   Height as of this encounter: 5' 6"  (1.676 m).   Weight as of this encounter: 145 lb (65.8 kg). WNL   Planned  Pending:   Pending further evaluation   Under consideration:   Diagnostic right genicular nerves block #1   Possible right genicular nerves RFA #1    Completed:   None at this time   Therapeutic  Palliative (PRN) options:   None established    Provider-requested follow-up: Return in about 2 weeks (around 01/31/2021) for procedure day (afternoon F2F) (PPE). Recent Visits Date Type Provider Dept  01/14/21 Office Visit Milinda Pointer, MD [facility]  Showing recent visits within past 90 days and meeting all other requirements Today's Visits Date Type Provider Dept  01/17/21 Procedure visit Milinda Pointer, MD [facility]  Showing today's visits and meeting all other requirements Future Appointments Date Type Provider Dept  01/31/21 Appointment Milinda Pointer, MD [facility]  02/25/21 Appointment Milinda Pointer, MD [facility]  Showing future appointments within next 90 days and meeting all other requirements Primary Care Physician: Einar Pheasant, MD Note by: Gaspar Cola, MD Date: 01/17/2021; Time: 4:42 PM

## 2021-01-18 ENCOUNTER — Telehealth: Payer: Self-pay

## 2021-01-18 NOTE — Telephone Encounter (Signed)
Called PP. States after the numbness wore off she was having some burning in the knee. Instructed to use heat today and to wait for the steroid to start working. Instructed to call if needed.

## 2021-01-22 ENCOUNTER — Ambulatory Visit (INDEPENDENT_AMBULATORY_CARE_PROVIDER_SITE_OTHER): Payer: Medicare HMO | Admitting: Internal Medicine

## 2021-01-22 ENCOUNTER — Other Ambulatory Visit: Payer: Self-pay

## 2021-01-22 ENCOUNTER — Encounter: Payer: Self-pay | Admitting: Internal Medicine

## 2021-01-22 DIAGNOSIS — K5010 Crohn's disease of large intestine without complications: Secondary | ICD-10-CM | POA: Diagnosis not present

## 2021-01-22 DIAGNOSIS — M25561 Pain in right knee: Secondary | ICD-10-CM | POA: Diagnosis not present

## 2021-01-22 DIAGNOSIS — G8929 Other chronic pain: Secondary | ICD-10-CM | POA: Diagnosis not present

## 2021-01-22 DIAGNOSIS — I10 Essential (primary) hypertension: Secondary | ICD-10-CM

## 2021-01-22 DIAGNOSIS — E538 Deficiency of other specified B group vitamins: Secondary | ICD-10-CM

## 2021-01-22 DIAGNOSIS — E7800 Pure hypercholesterolemia, unspecified: Secondary | ICD-10-CM

## 2021-01-22 MED ORDER — CLOTRIMAZOLE-BETAMETHASONE 1-0.05 % EX CREA: TOPICAL_CREAM | CUTANEOUS | 0 refills | Status: DC

## 2021-01-22 NOTE — Progress Notes (Signed)
Patient ID: Ellen Drake, female   DOB: 07-20-1946, 75 y.o.   MRN: [id number]   Virtual Visit via telephone Note  This visit type was conducted due to national recommendations for restrictions regarding the COVID-19 pandemic (e.g. social distancing).  This format is felt to be most appropriate for this patient at this time.  All issues noted in this document were discussed and addressed.  No physical exam was performed (except for noted visual exam findings with Video Visits).   I connected with [name] by telephone and verified that I am speaking with the correct person using two identifiers. Location patient: car Location provider: work  Persons participating in the virtual visit: patient, provider  The limitations, risks, security and privacy concerns of performing an evaluation and management service by telephone and the availability of in person appointments have been discussed.  It has also been discussed with the patient that there may be a patient responsible charge related to this service. The patient expressed understanding and agreed to proceed.   Reason for visit: follow up appt.   HPI:  Scheduled for f/u of her blood pressure.  Having increased sneezing and watery eyes.  No fever.  Some congestion.  Discussed using steroid nasal spray.  No chest congestion.  No chest pain or sob.  No acid reflux.  No abdominal pain.  Bowels moving.  Persistent knee pain.  Planning for MRI Thursday.  Following with ortho.  Handling stress.  Has been on steroids.    ROS: See pertinent positives and negatives per HPI.  Past Medical History:  Diagnosis Date   Anemia    reslved   Colon polyp 2009   Crohn's disease ([place])    Diverticulosis    Fibrocystic breast disease    Herniated nucleus pulposus of lumbosacral region    History of chicken pox    Hypercholesterolemia    Hyperlipidemia    Hypertension    Osteoarthritis    Pulmonary nodule seen on imaging study 2004   resolved  2007    Past Surgical History:  Procedure Laterality Date   anterior cervical discectomy and fusion  1998   Dr Cherene Altes SURGERY  1992   ruptured disc (Dr Mauri Pole)   BREAST BIOPSY Left    neg-no scar seen   CATARACT EXTRACTION W/PHACO Right 02/09/2019   Procedure: CATARACT EXTRACTION PHACO AND INTRAOCULAR LENS PLACEMENT ([place]) [place]  RIGHT HEALON 5, VISION BLUE;  Surgeon: Leandrew Koyanagi, MD;  Location: [facility];  Service: Ophthalmology;  Laterality: Right;   COLECTOMY  2003   [facility]   COLONOSCOPY  2009   COLONOSCOPY W/ BIOPSIES  11/30/2012   Procedure: COLONOSCOPY W/BIOPSY; Surgeon: Colvin Caroli, MD; Location: [facility]; Service: Gastroenterology;;   COLONOSCOPY W/ BIOPSIES  04/15/2016   Procedure: Colonoscopy ; Surgeon: Colvin Caroli, MD; Location: [place]; Service: Gastroenterology; Laterality: N/A;   KNEE ARTHROSCOPY W/ SYNOVECTOMY Right 03/10/2014   limited   KNEE SURGERY Right 1/16   [facility] (Dr. Sherlynn Carbon)   [place] / DECOMPRESSION LUMBAR SPINE  1999   REPLACEMENT TOTAL KNEE     ROTATOR CUFF REPAIR Left 2007   acute open; accident    Family History  Problem Relation Age of Onset   CVA Mother    Hypertension Mother    Hypertension Sister        brain tumor   Breast cancer Neg Hx    Colon  cancer Neg Hx     SOCIAL HX: reviewed.    Current Outpatient Medications:    amLODipine (NORVASC) 10 MG tablet, Take 1 tablet (10 mg total) by mouth daily., Disp: 90 tablet, Rfl: 1   ASPIRIN 81 PO, Take by mouth daily., Disp: , Rfl:    atenolol (TENORMIN) 50 MG tablet, TAKE 1 TABLET BY MOUTH EVERY DAY, Disp: 90 tablet, Rfl: 1   brimonidine (ALPHAGAN) 0.2 % ophthalmic solution, 1 drop 2 (two) times daily. , Disp: , Rfl:    gabapentin (NEURONTIN) 100 MG capsule, Take 100 mg by mouth 3 (three) times daily. 168m only at night, Disp: , Rfl:    latanoprost (XALATAN) 0.005 %  ophthalmic solution, Place 1 drop into both eyes at bedtime. , Disp: , Rfl:    lisinopril (ZESTRIL) 40 MG tablet, TAKE 1 TABLET BY MOUTH EVERY DAY, Disp: 90 tablet, Rfl: 1   nystatin cream (MYCOSTATIN), Apply 1 application topically 2 (two) times daily., Disp: 30 g, Rfl: 0   rosuvastatin (CRESTOR) 10 MG tablet, TAKE 1 TABLET BY MOUTH EVERY DAY, Disp: 90 tablet, Rfl: 1   sulfaSALAzine (AZULFIDINE) 500 MG tablet, Take 1,500 mg by mouth 2 (two) times daily. 3 tabs, Disp: , Rfl:    timolol (TIMOPTIC) 0.5 % ophthalmic solution, Place 1 drop into both eyes 2 (two) times daily., Disp: , Rfl: 3   traMADol (ULTRAM) 50 MG tablet, Take 1 tablet (50 mg total) by mouth 2 (two) times daily as needed., Disp: 60 tablet, Rfl: 0   clotrimazole-betamethasone (LOTRISONE) cream, APPLY TO AFFECTED AREA TWICE A DAY, Disp: 30 g, Rfl: 0  EXAM:  GENERAL: alert. Sounds to be in no acute distress.  Answering questions appropriately.    PSYCH/NEURO: pleasant and cooperative, no obvious depression or anxiety, speech and thought processing grossly intact  ASSESSMENT AND PLAN:  Discussed the following assessment and plan:  Problem List Items Addressed This Visit     [place]    Stable.  Continue sulfasalazine.  Followed by Dr WRedmond Pulling        Hypercholesterolemia    Continue crestor.  Follow lipid panel and liver function tests.        Hypertension    Continue lisinopril, amlodipine and atenolol.   Follow pressures.  Follow metabolic panel. Hopefully if can get pain under better control, blood pressure will improve.         Vitamin B12 deficiency    Continue B12 injections.        Chronic knee pain (Right) (Chronic)    Persistent knee pain.  Has seen multiple specialist.  Being followed at pain clinic now.  Scheduled for MRI this week.  Follow.         Return in about 8 weeks (around 03/19/2021) for follow up appt (349m).   I discussed the assessment and treatment plan with the patient.  The patient was provided an opportunity to ask questions and all were answered. The patient agreed with the plan and demonstrated an understanding of the instructions.   The patient was advised to call back or seek an in-person evaluation if the symptoms worsen or if the condition fails to improve as anticipated.  I provided 22 minutes of non-face-to-face time during this encounter.   ChEinar PheasantMD

## 2021-01-24 ENCOUNTER — Ambulatory Visit
Admission: RE | Admit: 2021-01-24 | Discharge: 2021-01-24 | Disposition: A | Discharge status: Home / Self Care | Payer: Medicare HMO | Source: Ambulatory Visit | Attending: Pain Medicine | Admitting: Pain Medicine

## 2021-01-24 ENCOUNTER — Other Ambulatory Visit: Payer: Self-pay

## 2021-01-24 DIAGNOSIS — Z96651 Presence of right artificial knee joint: Secondary | ICD-10-CM

## 2021-01-24 DIAGNOSIS — G8929 Other chronic pain: Secondary | ICD-10-CM | POA: Diagnosis not present

## 2021-01-24 DIAGNOSIS — M25561 Pain in right knee: Secondary | ICD-10-CM | POA: Diagnosis not present

## 2021-01-24 DIAGNOSIS — M25461 Effusion, right knee: Secondary | ICD-10-CM | POA: Insufficient documentation

## 2021-01-29 ENCOUNTER — Other Ambulatory Visit: Payer: Self-pay | Admitting: Internal Medicine

## 2021-01-31 ENCOUNTER — Ambulatory Visit: Payer: Medicare HMO | Admitting: Pain Medicine

## 2021-02-03 ENCOUNTER — Telehealth: Payer: Self-pay | Admitting: Internal Medicine

## 2021-02-03 NOTE — Assessment & Plan Note (Signed)
Persistent knee pain.  Has seen multiple specialist.  Being followed at pain clinic now.  Scheduled for MRI this week.  Follow.

## 2021-02-03 NOTE — Assessment & Plan Note (Signed)
Stable.  Continue sulfasalazine.  Followed by Dr Redmond Pulling.

## 2021-02-03 NOTE — Assessment & Plan Note (Signed)
Continue crestor.  Follow lipid panel and liver function tests.

## 2021-02-03 NOTE — Assessment & Plan Note (Signed)
Continue B12 injections.

## 2021-02-03 NOTE — Assessment & Plan Note (Addendum)
Continue lisinopril, amlodipine and atenolol.   Follow pressures.  Follow metabolic panel. Hopefully if can get pain under better control, blood pressure will improve.

## 2021-02-03 NOTE — Telephone Encounter (Signed)
Needs an 8 week f/u appt scheduled.  Thanks.

## 2021-02-04 NOTE — Telephone Encounter (Signed)
LMTCB and schedule

## 2021-02-05 ENCOUNTER — Other Ambulatory Visit: Payer: Self-pay

## 2021-02-05 ENCOUNTER — Encounter: Payer: Self-pay | Admitting: Pain Medicine

## 2021-02-05 ENCOUNTER — Ambulatory Visit (INDEPENDENT_AMBULATORY_CARE_PROVIDER_SITE_OTHER): Payer: Medicare HMO

## 2021-02-05 ENCOUNTER — Ambulatory Visit: Payer: Medicare HMO | Attending: Pain Medicine | Admitting: Pain Medicine

## 2021-02-05 VITALS — BP 151/61 | HR 61 | Temp 96.9°F | Resp 16 | Ht 77.0 in | Wt 145.0 lb

## 2021-02-05 DIAGNOSIS — M25461 Effusion, right knee: Secondary | ICD-10-CM | POA: Diagnosis not present

## 2021-02-05 DIAGNOSIS — M25561 Pain in right knee: Secondary | ICD-10-CM | POA: Diagnosis not present

## 2021-02-05 DIAGNOSIS — M792 Neuralgia and neuritis, unspecified: Secondary | ICD-10-CM | POA: Insufficient documentation

## 2021-02-05 DIAGNOSIS — Z96651 Presence of right artificial knee joint: Secondary | ICD-10-CM

## 2021-02-05 DIAGNOSIS — Z79899 Other long term (current) drug therapy: Secondary | ICD-10-CM

## 2021-02-05 DIAGNOSIS — E538 Deficiency of other specified B group vitamins: Secondary | ICD-10-CM

## 2021-02-05 DIAGNOSIS — G8929 Other chronic pain: Secondary | ICD-10-CM | POA: Insufficient documentation

## 2021-02-05 DIAGNOSIS — G894 Chronic pain syndrome: Secondary | ICD-10-CM | POA: Diagnosis not present

## 2021-02-05 DIAGNOSIS — Z79891 Long term (current) use of opiate analgesic: Secondary | ICD-10-CM | POA: Diagnosis not present

## 2021-02-05 MED ORDER — GABAPENTIN 100 MG PO CAPS: ORAL_CAPSULE | ORAL | 0 refills | Status: DC

## 2021-02-05 MED ORDER — CYANOCOBALAMIN 1000 MCG/ML IJ SOLN
1000.0000 ug | Freq: Once | INTRAMUSCULAR | Status: AC
  Administered 2021-02-05: 1000 ug via INTRAMUSCULAR

## 2021-02-05 MED ORDER — TRAMADOL HCL 50 MG PO TABS: 50.0000 mg | ORAL_TABLET | Freq: Three times a day (TID) | ORAL | 0 refills | Status: DC | PRN

## 2021-02-05 NOTE — Progress Notes (Signed)
Patient presented for B 12 injection to left deltoid, patient voiced no concerns nor showed any signs of distress during injection. 

## 2021-02-05 NOTE — Progress Notes (Signed)
PROVIDER NOTE: Information contained herein reflects review and annotations entered in association with encounter. Interpretation of such information and data should be left to medically-trained personnel. Information provided to patient can be located elsewhere in the medical record under "Patient Instructions". Document created using STT-dictation technology, any transcriptional errors that may result from process are unintentional.    Patient: Ellen Drake Nanetta Batty  Service Category: E/M  Provider: Gaspar Cola, MD  DOB: 1946/06/28  DOS: 02/05/2021  Specialty: Interventional Pain Management  MRN: [id number]  Setting: Ambulatory outpatient  PCP: Einar Pheasant, MD  Type: Established Patient    Referring Provider: Einar Pheasant, MD  Location: Office  Delivery: Face-to-face     HPI  Ellen Drake, a 75 y.o. year old female, is here today because of her Chronic pain of right knee [M25.561, G89.29]. Ms. Henton primary complain today is Knee Pain (right) Last encounter: My last encounter with her was on 01/17/2021. 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 Pain Assessment: Severity of Chronic pain is reported as a 10-Worst pain ever/10. Location: Knee Right/denies. Onset: More than a month ago. Quality: Other (Comment) (stinging). Timing: Constant. Modifying factor(s): medications. Vitals:  height is 6' 5"  (1.956 m) and weight is 145 lb (65.8 kg). Her temporal temperature is 96.9 F (36.1 C) (abnormal). Her blood pressure is 151/61 (abnormal) and her  pulse is 61. Her respiration is 16 and oxygen saturation is 99%.   Reason for encounter: post-procedure assessment.  The patient indicated having attained a 100% relief of the pain for the duration of the local anesthetic.  Unfortunately, once that local anesthetic wore off, the pain started coming back.  Today we went over the results of the MRI and it would appear that she does not have any significant fluid accumulation and that what we see is primarily edema of the muscles around the knee joint.  Because the patient got slight relief of the pain with the local anesthetic, I have offered her radiofrequency ablation of the cluneal nerves.  Today I took the time to explain to her in detail what the procedure consisted of, how long it should last, and what it involves.  After all of this information was given to the patient she has decided to proceed with it.  Today I have also taken over her gabapentin so asked to adjusted and titrated to effect.  She is currently taking 100 mg p.o. twice daily without any significant benefit or side effect.  Today I have instructed the patient to begin taking 200 mg at bedtime and then the morning tablet to move it to noon.  The reason for this last change is because she indicated getting a little sleepy after taking that tablet in the morning.  Today I have also taken over her tramadol.  This when she refers not having anything left at home.  This 1 I have increased from twice daily to 3 times daily.  Once I have the patient on a stable regimen, with the exception of the of the tramadol, we will transfer the other none opioids to the PCP for continued management.  Post-Procedure Evaluation  Procedure (01/17/2021): Diagnostic right genicular nerves block #1 under fluoroscopic guidance and IV sedation Pre-procedure pain level: 0/10 Post-procedure: 0/10 (100% relief)  Anxiolysis: Moderate conscious sedation.  Effectiveness during initial hour after procedure  (Ultra-Short Term Relief): 100 %.  Local anesthetic used: Long-acting (4-6 hours) Effectiveness: Defined as any analgesic benefit obtained secondary to the administration of local anesthetics. This carries significant diagnostic value as to the etiological location, or anatomical origin, of the pain. Duration of benefit is expected to coincide with the duration of the local anesthetic used.  Effectiveness during initial 4-6 hours after procedure (Short-Term Relief): 100 %.  Long-term benefit: Defined as any relief past the pharmacologic duration of the local anesthetics.  Effectiveness past the initial 6 hours after procedure (Long-Term Relief): 0 %.  Benefits, current: Defined as benefit present at the time of this evaluation.   Analgesia:  Back to baseline Function: Back to baseline ROM: Back to baseline  Pharmacotherapy Assessment  Analgesic: Tramadol 50 mg 1 tab p.o. twice daily (100 mg/Drake of tramadol) (10 MME) MME/Drake: 10 mg/Drake   Monitoring: [place] PMP: PDMP reviewed during this encounter.       Pharmacotherapy: No side-effects or adverse reactions reported. Compliance: No problems identified. Effectiveness: Clinically acceptable.  Landis Martins, RN  02/05/2021  3:09 PM  Sign when Signing Visit Safety precautions to be maintained throughout the outpatient stay will include: orient to surroundings, keep bed in low position, maintain call bell within reach at all times, provide assistance with transfer out of bed and ambulation.     UDS:  No results found for: SUMMARY   ROS  Constitutional: Denies any fever or chills Gastrointestinal: No reported hemesis, hematochezia, vomiting, or acute GI distress Musculoskeletal: Denies any acute onset joint swelling, redness, loss of ROM, or weakness Neurological: No reported episodes of acute onset apraxia, aphasia, dysarthria, agnosia, amnesia, paralysis, loss of coordination, or loss of consciousness  Medication Review  Aspirin,  amLODipine, atenolol, brimonidine, clotrimazole-betamethasone, gabapentin, latanoprost, lisinopril, nystatin cream, rosuvastatin, sulfaSALAzine, timolol, and traMADol  History Review  Allergy: Ellen Drake is allergic to mercaptopurine, lyrica [pregabalin], and dilaudid [hydromorphone hcl]. Drug: Ellen Drake  reports no history of drug use. Alcohol:  reports current alcohol use of about 1.0 standard drink of alcohol per week. Tobacco:  reports that she quit smoking about 11 years ago. Her smoking use included cigarettes. She has never used smokeless tobacco. Social: Ellen Drake  reports that she quit smoking about 11 years ago. Her smoking use included cigarettes. She has never used smokeless tobacco. She reports current alcohol use of about 1.0 standard drink of alcohol per week. She reports that she does not use drugs. Medical:  has a past medical history of Anemia, Colon polyp (2009), Crohn's disease ([place]), Diverticulosis, Fibrocystic breast disease, Herniated nucleus pulposus of lumbosacral region, History of chicken pox, Hypercholesterolemia, Hyperlipidemia, Hypertension, Osteoarthritis, and Pulmonary nodule seen on imaging study (2004). Surgical: Ms. Henandez  has a past surgical history that includes Back surgery (1992); anterior cervical discectomy and fusion (1998); Knee surgery (Right, 1/16); Colonoscopy (2009); Colon surgery (1998); Posterior laminectomy / decompression lumbar spine (1999); Rotator cuff repair (Left, 2007); Colonoscopy w/ biopsies (11/30/2012); Colectomy (2003); Replacement total knee; Knee arthroscopy w/ synovectomy (Right, 03/10/2014); Colonoscopy w/ biopsies (04/15/2016); Cataract extraction w/PHACO (Right, 02/09/2019); and Breast biopsy (Left). Family: family history includes CVA in her mother; Hypertension in her mother and sister.  Laboratory Chemistry Profile   Renal Lab Results  Component Value Date   BUN 15 10/30/2020  CREATININE 0.79 10/30/2020   GFR 73.41 10/30/2020     Hepatic Lab Results  Component Value Date   AST 23 10/30/2020   ALT 16 10/30/2020   ALBUMIN 4.5 10/30/2020   ALKPHOS 73 10/30/2020    Electrolytes Lab Results  Component Value Date   NA 144 10/30/2020   K 3.7 10/30/2020   CL 107 10/30/2020   CALCIUM 9.7 10/30/2020   MG 2.0 01/14/2021    Bone Lab Results  Component Value Date   VD25OH 8.33 (L) 01/14/2021    Inflammation (CRP: Acute Phase) (ESR: Chronic Phase) Lab Results  Component Value Date   CRP 3.0 (H) 01/14/2021   ESRSEDRATE 31 (H) 01/14/2021          Note: Above Lab results reviewed.  Recent Imaging Review  MR KNEE RIGHT WO CONTRAST CLINICAL DATA:  Right knee pain, history of total knee replacement  EXAM: MRI OF THE RIGHT KNEE WITHOUT CONTRAST  TECHNIQUE: Multiplanar, multisequence MR imaging of the knee was performed. No intravenous contrast was administered.  COMPARISON:  Right knee radiograph 10/30/2020  FINDINGS: Bones/Joint/Cartilage  There is a total knee arthroplasty with associated susceptibility artifact. There is no acute osseous abnormality visualized around the prosthesis.  Ligaments  Visualized portions are grossly intact.  Muscles and Tendons There is mild intramuscular edema posteriorly involving the popliteus and proximal soleus muscle.  Soft tissue No focal fluid collection.  Small joint effusion.  IMPRESSION: Total knee arthroplasty with significant associated susceptibility artifact. No acute osseous abnormality visualized within the limitations of the study.  Mild intramuscular edema posteriorly involving the popliteus and proximal soleus muscle, which could represent a mild muscle strain.  No focal fluid collection.  Small joint effusion.  Electronically Signed   By: Maurine Simmering   On: 01/24/2021 15:20 Note: Reviewed        Physical Exam  General appearance: Well nourished, well developed, and well hydrated. In no apparent acute distress Mental status: Alert,  oriented x 3 (person, place, & time)       Respiratory: No evidence of acute respiratory distress Eyes: PERLA Vitals: BP (!) 151/61 (BP Location: Right Arm, Patient Position: Sitting, Cuff Size: Normal)   Pulse 61   Temp (!) 96.9 F (36.1 C) (Temporal)   Resp 16   Ht 6' 5"  (1.956 m)   Wt 145 lb (65.8 kg)   LMP 08/13/1995   SpO2 99%   BMI 17.19 kg/m  BMI: Estimated body mass index is 17.19 kg/m as calculated from the following:   Height as of this encounter: 6' 5"  (1.956 m).   Weight as of this encounter: 145 lb (65.8 kg). Ideal: Ideal body weight: 84.6 kg (186 lb 8.1 oz)  Assessment   Status Diagnosis  Controlled Controlled Controlled 1. Chronic knee pain (Right)   2. 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 Pain and swelling of knee (Right)   4. S/P revision of total knee (Right)   5. Chronic pain syndrome   6. Neurogenic pain   7. Pharmacologic therapy   8. Chronic use of opiate for therapeutic purpose   9. Encounter for medication management      Updated Problems: Problem  Neurogenic Pain    Plan of Care  Problem-specific:  No problem-specific Assessment & Plan notes found for this encounter.  Ellen Drake has a current medication list which includes the following long-term medication(s): amlodipine, atenolol, lisinopril, rosuvastatin, sulfasalazine, gabapentin, and tramadol.  Pharmacotherapy (Medications  Ordered): Meds ordered this encounter  Medications   gabapentin (NEURONTIN) 100 MG capsule    Sig: Take 1 capsule (100 mg total) by mouth daily with lunch AND 2 capsules (200 mg total) at bedtime. 173m only at night.    Dispense:  90 capsule    Refill:  0   traMADol (ULTRAM) 50 MG tablet    Sig: Take 1 tablet (50 mg total) by mouth 3 (three) times daily as needed.    Dispense:  60 tablet    Refill:  0   Orders:  Orders Placed This Encounter  Procedures   Radiofrequency,Genicular    Standing Status:   Future     Standing Expiration Date:   02/05/2022    Scheduling Instructions:     Side(s): Right Knee     Level(s): Superior-Lateral, Superior-Medial, and Inferior-Medial Genicular Nerve(s)     Sedation: With Sedation.     Scheduling Timeframe: As soon as pre-approved    Order Specific Question:   Where will this procedure be performed?    Answer:   [facility]   Compliance Drug Analysis, Ur    Volume: 30 ml(s). Minimum 3 ml of urine is needed. Document temperature of fresh sample. Indications: Long term (current) use of opiate analgesic ((X32.440 Test#: [id number](Comprehensive Profile)    Order Specific Question:   Release to patient    Answer:   Immediate   Nursing Instructions:    1. Medication Agreement: Please go over agreement with the patient. Have the patient read and sign the agreement. Provide patient with a copy of the signed agreement. 2. Make sure that the patient has completed the ORT (Opioid Risk Tool). 3. Provide the patient with a copy of our "Medicatiion Policy", "Medication Recommendations and Reminders", and "CBD information". 4. Remind the patient to always bring their medications and medication bottles (even if empty) to all appointments except for procedure appointments.    Scheduling Instructions:     Sign "Medication Agreement", complete "Opioid Risk Tool", inform patient of our practice "Medication Policies" (Pill counts, always bring bottles, except on procedure days).   Nursing communication    Scheduling Instructions:     Complete/update the opioid risk tool (ORT) questionnaire.   Follow-up plan:   Return for RFA (429m): (R) Genicular RFA #1 (ASAA).     Interventional Therapies  Risk  Complexity Considerations:   Estimated body mass index is 23.4 kg/m as calculated from the following:   Height as of this encounter: 5' 6"  (1.676 m).   Weight as of this encounter: 145 lb (65.8 kg). WNL   Planned  Pending:   Pending further evaluation   Under  consideration:   Diagnostic right genicular nerves block #1  Possible right genicular nerves RFA #1    Completed:   None at this time   Therapeutic  Palliative (PRN) options:   None established     Recent Visits Date Type Provider Dept  01/17/21 Procedure visit NaMilinda PointerMD [facility]  01/14/21 Office Visit NaMilinda PointerMD [facility]  Showing recent visits within past 90 days and meeting all other requirements Today's Visits Date Type Provider Dept  02/05/21 Office Visit NaMilinda PointerMD [facility]  Showing today's visits and meeting all other requirements Future Appointments Date Type Provider Dept  02/25/21 Appointment NaMilinda PointerMD [facility]  Showing future appointments within next 90 days and meeting all other requirements I discussed the assessment and treatment plan with the patient.  The patient was provided an opportunity to ask questions and all were answered. The patient agreed with the plan and demonstrated an understanding of the instructions.  Patient advised to call back or seek an in-person evaluation if the symptoms or condition worsens.  Duration of encounter: 38 minutes.  Note by: Gaspar Cola, MD Date: 02/05/2021; Time: 7:51 PM

## 2021-02-05 NOTE — Progress Notes (Signed)
Safety precautions to be maintained throughout the outpatient stay will include: orient to surroundings, keep bed in low position, maintain call bell within reach at all times, provide assistance with transfer out of bed and ambulation.  

## 2021-02-05 NOTE — Patient Instructions (Signed)
____________________________________________________________________________________________  Preparing for Procedure with Sedation  Procedure appointments are limited to planned procedures: No Prescription Refills. No disability issues will be discussed. No medication changes will be discussed.  Instructions: Oral Intake: Do not eat or drink anything for at least 8 hours prior to your procedure. (Exception: Blood Pressure Medication. See below.) Transportation: Unless otherwise stated by your physician, you may drive yourself after the procedure. Blood Pressure Medicine: Do not forget to take your blood pressure medicine with a sip of water the morning of the procedure. If your Diastolic (lower reading)is above 100 mmHg, elective cases will be cancelled/rescheduled. Blood thinners: These will need to be stopped for procedures. Notify our staff if you are taking any blood thinners. Depending on which one you take, there will be specific instructions on how and when to stop it. Diabetics on insulin: Notify the staff so that you can be scheduled 1st case in the morning. If your diabetes requires high dose insulin, take only  of your normal insulin dose the morning of the procedure and notify the staff that you have done so. Preventing infections: Shower with an antibacterial soap the morning of your procedure. Build-up your immune system: Take 1000 mg of Vitamin C with every meal (3 times a day) the day prior to your procedure. Antibiotics: Inform the staff if you have a condition or reason that requires you to take antibiotics before dental procedures. Pregnancy: If you are pregnant, call and cancel the procedure. Sickness: If you have a cold, fever, or any active infections, call and cancel the procedure. Arrival: You must be in the facility at least 30 minutes prior to your scheduled procedure. Children: Do not bring children with you. Dress appropriately: Bring dark clothing that you would  not mind if they get stained. Valuables: Do not bring any jewelry or valuables.  Reasons to call and reschedule or cancel your procedure: (Following these recommendations will minimize the risk of a serious complication.) Surgeries: Avoid having procedures within 2 weeks of any surgery. (Avoid for 2 weeks before or after any surgery). Flu Shots: Avoid having procedures within 2 weeks of a flu shots or . (Avoid for 2 weeks before or after immunizations). Barium: Avoid having a procedure within 7-10 days after having had a radiological study involving the use of radiological contrast. (Myelograms, Barium swallow or enema study). Heart attacks: Avoid any elective procedures or surgeries for the initial 6 months after a "Myocardial Infarction" (Heart Attack). Blood thinners: It is imperative that you stop these medications before procedures. Let us know if you if you take any blood thinner.  Infection: Avoid procedures during or within two weeks of an infection (including chest colds or gastrointestinal problems). Symptoms associated with infections include: Localized redness, fever, chills, night sweats or profuse sweating, burning sensation when voiding, cough, congestion, stuffiness, runny nose, sore throat, diarrhea, nausea, vomiting, cold or Flu symptoms, recent or current infections. It is specially important if the infection is over the area that we intend to treat. Heart and lung problems: Symptoms that may suggest an active cardiopulmonary problem include: cough, chest pain, breathing difficulties or shortness of breath, dizziness, ankle swelling, uncontrolled high or unusually low blood pressure, and/or palpitations. If you are experiencing any of these symptoms, cancel your procedure and contact your primary care physician for an evaluation.  Remember:  Regular Business hours are:  Monday to Thursday 8:00 AM to 4:00 PM  Provider's Schedule: Milinda Pointer, MD:  Procedure days: Tuesday and  Thursday 7:30 AM  to 4:00 PM  Gillis Santa, MD:  Procedure days: Monday and Wednesday 7:30 AM to 4:00 PM ____________________________________________________________________________________________  ____________________________________________________________________________________________  General Risks and Possible Complications  Patient Responsibilities: It is important that you read this as it is part of your informed consent. It is our duty to inform you of the risks and possible complications associated with treatments offered to you. It is your responsibility as a patient to read this and to ask questions about anything that is not clear or that you believe was not covered in this document.  Patient's Rights: You have the right to refuse treatment. You also have the right to change your mind, even after initially having agreed to have the treatment done. However, under this last option, if you wait until the last second to change your mind, you may be charged for the materials used up to that point.  Introduction: Medicine is not an Chief Strategy Officer. Everything in Medicine, including the lack of treatment(s), carries the potential for danger, harm, or loss (which is by definition: Risk). In Medicine, a complication is a secondary problem, condition, or disease that can aggravate an already existing one. All treatments carry the risk of possible complications. The fact that a side effects or complications occurs, does not imply that the treatment was conducted incorrectly. It must be clearly understood that these can happen even when everything is done following the highest safety standards.  No treatment: You can choose not to proceed with the proposed treatment alternative. The "PRO(s)" would include: avoiding the risk of complications associated with the therapy. The "CON(s)" would include: not getting any of the treatment benefits. These benefits fall under one of three categories: diagnostic;  therapeutic; and/or palliative. Diagnostic benefits include: getting information which can ultimately lead to improvement of the disease or symptom(s). Therapeutic benefits are those associated with the successful treatment of the disease. Finally, palliative benefits are those related to the decrease of the primary symptoms, without necessarily curing the condition (example: decreasing the pain from a flare-up of a chronic condition, such as incurable terminal cancer).  General Risks and Complications: These are associated to most interventional treatments. They can occur alone, or in combination. They fall under one of the following six (6) categories: no benefit or worsening of symptoms; bleeding; infection; nerve damage; allergic reactions; and/or death. No benefits or worsening of symptoms: In Medicine there are no guarantees, only probabilities. No healthcare provider can ever guarantee that a medical treatment will work, they can only state the probability that it may. Furthermore, there is always the possibility that the condition may worsen, either directly, or indirectly, as a consequence of the treatment. Bleeding: This is more common if the patient is taking a blood thinner, either prescription or over the counter (example: Goody Powders, Fish oil, Aspirin, Garlic, etc.), or if suffering a condition associated with impaired coagulation (example: Hemophilia, cirrhosis of the liver, low platelet counts, etc.). However, even if you do not have one on these, it can still happen. If you have any of these conditions, or take one of these drugs, make sure to notify your treating physician. Infection: This is more common in patients with a compromised immune system, either due to disease (example: diabetes, cancer, human immunodeficiency virus [HIV], etc.), or due to medications or treatments (example: therapies used to treat cancer and rheumatological diseases). However, even if you do not have one on  these, it can still happen. If you have any of these conditions, or take one of  these drugs, make sure to notify your treating physician. Nerve Damage: This is more common when the treatment is an invasive one, but it can also happen with the use of medications, such as those used in the treatment of cancer. The damage can occur to small secondary nerves, or to large primary ones, such as those in the spinal cord and brain. This damage may be temporary or permanent and it may lead to impairments that can range from temporary numbness to permanent paralysis and/or brain death. Allergic Reactions: Any time a substance or material comes in contact with our body, there is the possibility of an allergic reaction. These can range from a mild skin rash (contact dermatitis) to a severe systemic reaction (anaphylactic reaction), which can result in death. Death: In general, any medical intervention can result in death, most of the time due to an unforeseen complication. ____________________________________________________________________________________________

## 2021-02-06 DIAGNOSIS — Z79899 Other long term (current) drug therapy: Secondary | ICD-10-CM | POA: Diagnosis not present

## 2021-02-06 DIAGNOSIS — G894 Chronic pain syndrome: Secondary | ICD-10-CM | POA: Diagnosis not present

## 2021-02-14 LAB — COMPLIANCE DRUG ANALYSIS, UR

## 2021-02-19 DIAGNOSIS — Z79891 Long term (current) use of opiate analgesic: Secondary | ICD-10-CM | POA: Insufficient documentation

## 2021-02-19 NOTE — Progress Notes (Signed)
PROVIDER NOTE: Information contained herein reflects review and annotations entered in association with encounter. Interpretation of such information and data should be left to medically-trained personnel. Information provided to patient can be located elsewhere in the medical record under "Patient Instructions". Document created using STT-dictation technology, any transcriptional errors that may result from process are unintentional.    Patient: Ellen Drake  Service Category: E/M  Provider: Gaspar Cola, MD  DOB: Jul 18, 1946  DOS: 02/20/2021  Specialty: Interventional Pain Management  MRN: [id number]  Setting: Ambulatory outpatient  PCP: Ellen Pheasant, MD  Type: Established Patient    Referring Provider: Einar Pheasant, MD  Location: Office  Delivery: Face-to-face     HPI  Ellen Drake, a 75 y.o. year old female, is here today because of her Chronic pain syndrome [G89.4]. Ms. Beers primary complain today is Knee Pain (right) Last encounter: My last encounter with her was on 02/05/2021. Pertinent problems: 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 Pain Assessment: Severity of Chronic pain is reported as a 5 /10. Location: Knee Right/denies. Onset: More than a month ago. Quality: Aching, Constant. Timing: Constant. Modifying factor(s): medications, rest. Vitals:  height is 5' 6"  (1.676 m) and weight is 145 lb (65.8 kg). Her temporal temperature is 96.8 F (36 C) (abnormal). Her blood pressure is 144/65 (abnormal) and her pulse is 55 (abnormal). Her respiration  is 16 and oxygen saturation is 99%.   Reason for encounter: medication management.   The patient indicates doing well with the current medication regimen. No adverse reactions or side effects reported to the medications.  The patient's last UDS came back without the tramadol.  Today we will repeat that test.  The patient indicated having taken her medicine this morning.  We also have the patient scheduled to return on 03/07/2021 for a diagnostic right genicular nerve block.  If she gets good relief of the pain with the diagnostic injection, we will consider radiofrequency ablation.  RTCB: 05/06/2021  Pharmacotherapy Assessment  Analgesic: Tramadol 50 mg 1 tab p.o. twice daily (100 mg/day of tramadol) (10 MME) MME/day: 10 mg/day   Monitoring: [place] PMP: PDMP reviewed during this encounter.       Pharmacotherapy: No side-effects or adverse reactions reported. Compliance: No problems identified. Effectiveness: Clinically acceptable.  Ignatius Specking, RN  02/20/2021  1:53 PM  Sign when Signing Visit Nursing Pain Medication Assessment:  Safety precautions to be maintained throughout the outpatient stay will include: orient to surroundings, keep bed in low position, maintain call bell within reach at all times, provide assistance with transfer out of bed and ambulation.  Medication Inspection Compliance: Pill count conducted under aseptic conditions, in front of the patient. Neither the pills nor the bottle was removed from the patient's sight at any time. Once count was completed pills were immediately returned to the patient in their original bottle.  Medication: Tramadol (Ultram) Pill/Patch Count:  33 of 60 pills remain Pill/Patch Appearance: Markings consistent with prescribed medication Bottle Appearance: Standard pharmacy container. Clearly labeled. Filled Date: 6 / 45 / 2022 Last Medication intake:  Today     UDS:  Summary  Date  Value Ref Range Status  02/06/2021 Note  Final    Comment:     ==================================================================== Compliance Drug Analysis, Ur ==================================================================== Test                             Result       Flag       Units  Drug Present and Declared for Prescription Verification   Gabapentin                     PRESENT      EXPECTED   Atenolol                       PRESENT      EXPECTED  Drug Present not Declared for Prescription Verification   Acetaminophen                  PRESENT      UNEXPECTED  Drug Absent but Declared for Prescription Verification   Tramadol                       Not Detected UNEXPECTED ng/mg creat   Salicylate                     Not Detected UNEXPECTED    Aspirin, as indicated in the declared medication list, is not always    detected even when used as directed.  ==================================================================== Test                      Result    Flag   Units      Ref Range   Creatinine              255              mg/dL      >=20 ==================================================================== Declared Medications:  The flagging and interpretation on this report are based on the  following declared medications.  Unexpected results may arise from  inaccuracies in the declared medications.   **Note: The testing scope of this panel includes these medications:   Atenolol  Gabapentin  Tramadol (Ultram)   **Note: The testing scope of this panel does not include small to  moderate amounts of these reported medications:   Aspirin   **Note: The testing scope of this panel does not include the  following reported medications:   Amlodipine  Betamethasone (Lotrisone)  Brimonidine  Clotrimazole (Lotrisone)  Eye Drop  Lisinopril  Nystatin  Rosuvastatin  Sulfasalazine  Timolol ==================================================================== For clinical consultation, please call [phone]. ====================================================================      ROS  Constitutional: Denies any fever or chills Gastrointestinal: No reported hemesis, hematochezia, vomiting, or acute GI distress Musculoskeletal: Denies any acute onset joint swelling, redness, loss of ROM, or weakness Neurological: No reported episodes of acute onset apraxia, aphasia, dysarthria, agnosia, amnesia, paralysis, loss of coordination, or loss of consciousness  Medication Review  Aspirin, amLODipine, atenolol, brimonidine, clotrimazole-betamethasone, gabapentin, latanoprost, lisinopril, nystatin cream, rosuvastatin, sulfaSALAzine, timolol, and traMADol  History Review  Allergy: Ms. Morgenthaler is allergic to mercaptopurine, lyrica [pregabalin], and dilaudid [hydromorphone hcl]. Drug: Ms. Zogg  reports no history of drug use. Alcohol:  reports current alcohol use of about 1.0 standard drink of alcohol per week. Tobacco:  reports that she quit smoking about 11 years ago. Her smoking use included cigarettes. She has never used smokeless tobacco.  Social: Ms. Flott  reports that she quit smoking about 11 years ago. Her smoking use included cigarettes. She has never used smokeless tobacco. She reports current alcohol use of about 1.0 standard drink of alcohol per week. She reports that she does not use drugs. Medical:  has a past medical history of Anemia, Colon polyp (2009), Crohn's disease ([place]), Diverticulosis, Fibrocystic breast disease, Herniated nucleus pulposus of lumbosacral region, History of chicken pox, Hypercholesterolemia, Hyperlipidemia, Hypertension, Osteoarthritis, and Pulmonary nodule seen on imaging study (2004). Surgical: Ms. Rahal  has a past surgical history that includes Back surgery (1992); anterior cervical discectomy and fusion (1998); Knee surgery (Right, 1/16); Colonoscopy (2009); Colon surgery (1998); Posterior laminectomy / decompression lumbar spine (1999); Rotator cuff  repair (Left, 2007); Colonoscopy w/ biopsies (11/30/2012); Colectomy (2003); Replacement total knee; Knee arthroscopy w/ synovectomy (Right, 03/10/2014); Colonoscopy w/ biopsies (04/15/2016); Cataract extraction w/PHACO (Right, 02/09/2019); and Breast biopsy (Left). Family: family history includes CVA in her mother; Hypertension in her mother and sister.  Laboratory Chemistry Profile   Renal Lab Results  Component Value Date   BUN 15 10/30/2020   CREATININE 0.79 10/30/2020   GFR 73.41 10/30/2020    Hepatic Lab Results  Component Value Date   AST 23 10/30/2020   ALT 16 10/30/2020   ALBUMIN 4.5 10/30/2020   ALKPHOS 73 10/30/2020    Electrolytes Lab Results  Component Value Date   NA 144 10/30/2020   K 3.7 10/30/2020   CL 107 10/30/2020   CALCIUM 9.7 10/30/2020   MG 2.0 01/14/2021    Bone Lab Results  Component Value Date   VD25OH 8.33 (L) 01/14/2021    Inflammation (CRP: Acute Phase) (ESR: Chronic Phase) Lab Results  Component Value Date   CRP 3.0 (H) 01/14/2021   ESRSEDRATE 31 (H) 01/14/2021       Note: Above Lab results reviewed.  Recent Imaging Review  MR KNEE RIGHT WO CONTRAST CLINICAL DATA:  Right knee pain, history of total knee replacement  EXAM: MRI OF THE RIGHT KNEE WITHOUT CONTRAST  TECHNIQUE: Multiplanar, multisequence MR imaging of the knee was performed. No intravenous contrast was administered.  COMPARISON:  Right knee radiograph 10/30/2020  FINDINGS: Bones/Joint/Cartilage  There is a total knee arthroplasty with associated susceptibility artifact. There is no acute osseous abnormality visualized around the prosthesis.  Ligaments  Visualized portions are grossly intact.  Muscles and Tendons There is mild intramuscular edema posteriorly involving the popliteus and proximal soleus muscle.  Soft tissue No focal fluid collection.  Small joint effusion.  IMPRESSION: Total knee arthroplasty with significant associated  susceptibility artifact. No acute osseous abnormality visualized within the limitations of the study.  Mild intramuscular edema posteriorly involving the popliteus and proximal soleus muscle, which could represent a mild muscle strain.  No focal fluid collection.  Small joint effusion.  Electronically Signed   By: Maurine Simmering   On: 01/24/2021 15:20 Note: Reviewed        Physical Exam  General appearance: Well nourished, well developed, and well hydrated. In no apparent acute distress Mental status: Alert, oriented x 3 (person, place, & time)       Respiratory: No evidence of acute respiratory distress Eyes: PERLA Vitals: BP (!) 144/65 (BP Location: Right Arm, Patient Position: Sitting, Cuff Size: Normal)   Pulse (!) 55   Temp (!) 96.8 F (36 C) (Temporal)   Resp 16   Ht 5' 6"  (1.676 m)   Wt 145 lb (65.8 kg)   LMP 08/13/1995   SpO2  99%   BMI 23.40 kg/m  BMI: Estimated body mass index is 23.4 kg/m as calculated from the following:   Height as of this encounter: 5' 6"  (1.676 m).   Weight as of this encounter: 145 lb (65.8 kg). Ideal: Ideal body weight: 59.3 kg (130 lb 11.7 oz) Adjusted ideal body weight: 61.9 kg (136 lb 7 oz)  Assessment   Status Diagnosis  Controlled Controlled Controlled 1. Chronic pain syndrome   2. Chronic knee pain (Right)   3. 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 Pharmacologic therapy   5. Chronic use of opiate for therapeutic purpose   6. Encounter for medication management      Updated Problems: No problems updated.   Plan of Care  Problem-specific:  No problem-specific Assessment & Plan notes found for this encounter.  Ms. Ashantia Day Nguyen has a current medication list which includes the following long-term medication(s): amlodipine, atenolol, gabapentin, lisinopril, rosuvastatin, sulfasalazine, and [START ON 03/07/2021] tramadol.  Pharmacotherapy (Medications Ordered): Meds ordered this encounter   Medications   traMADol (ULTRAM) 50 MG tablet    Sig: Take 1 tablet (50 mg total) by mouth 2 (two) times daily.    Dispense:  60 tablet    Refill:  1    Not a duplicate. Do NOT delete! Dispense 1 day early if closed on fill date. Warn not to take CNS-depressants 8 hours before or after taking opioid. Do not send refill request. Renewal requires appointment.    Orders:  Orders Placed This Encounter  Procedures   ToxASSURE Select 13 (MW), Urine    Volume: 30 ml(s). Minimum 3 ml of urine is needed. Document temperature of fresh sample. Indications: Long term (current) use of opiate analgesic (G62.694)    Order Specific Question:   Release to patient    Answer:   Immediate    Follow-up plan:   Return for scheduled encounter.     Interventional Therapies  Risk  Complexity Considerations:   Estimated body mass index is 23.4 kg/m as calculated from the following:   Height as of this encounter: 5' 6"  (1.676 m).   Weight as of this encounter: 145 lb (65.8 kg). WNL   Planned  Pending:   Pending further evaluation   Under consideration:   Diagnostic right genicular nerves block #1  Possible right genicular nerves RFA #1    Completed:   None at this time   Therapeutic  Palliative (PRN) options:   None established      Recent Visits Date Type Provider Dept  02/05/21 Office Visit Milinda Pointer, MD [facility]  01/17/21 Procedure visit Milinda Pointer, MD [facility]  01/14/21 Office Visit Milinda Pointer, MD [facility]  Showing recent visits within past 90 days and meeting all other requirements Today's Visits Date Type Provider Dept  02/20/21 Office Visit Milinda Pointer, MD [facility]  Showing today's visits and meeting all other requirements Future Appointments Date Type Provider Dept  03/07/21 Appointment Milinda Pointer, MD [facility]  Showing future appointments within next 90 days and meeting all  other requirements I discussed the assessment and treatment plan with the patient. The patient was provided an opportunity to ask questions and all were answered. The patient agreed with the plan and demonstrated an understanding of the instructions.  Patient advised to call back or seek an in-person evaluation if the symptoms or condition worsens.  Duration of encounter: 30 minutes.  Note by:  Ellen Cola, MD Date: 02/20/2021; Time: 3:05 PM

## 2021-02-20 ENCOUNTER — Encounter: Payer: Self-pay | Admitting: Pain Medicine

## 2021-02-20 ENCOUNTER — Other Ambulatory Visit: Payer: Self-pay

## 2021-02-20 ENCOUNTER — Ambulatory Visit: Payer: Medicare HMO | Attending: Pain Medicine | Admitting: Pain Medicine

## 2021-02-20 VITALS — BP 144/65 | HR 55 | Temp 96.8°F | Resp 16 | Ht 66.0 in | Wt 145.0 lb

## 2021-02-20 DIAGNOSIS — G8929 Other chronic pain: Secondary | ICD-10-CM

## 2021-02-20 DIAGNOSIS — M25561 Pain in right knee: Secondary | ICD-10-CM | POA: Insufficient documentation

## 2021-02-20 DIAGNOSIS — Z96651 Presence of right artificial knee joint: Secondary | ICD-10-CM | POA: Diagnosis not present

## 2021-02-20 DIAGNOSIS — Z79899 Other long term (current) drug therapy: Secondary | ICD-10-CM | POA: Diagnosis not present

## 2021-02-20 DIAGNOSIS — G894 Chronic pain syndrome: Secondary | ICD-10-CM | POA: Diagnosis not present

## 2021-02-20 DIAGNOSIS — Z79891 Long term (current) use of opiate analgesic: Secondary | ICD-10-CM | POA: Insufficient documentation

## 2021-02-20 MED ORDER — TRAMADOL HCL 50 MG PO TABS: 50.0000 mg | ORAL_TABLET | Freq: Two times a day (BID) | ORAL | 1 refills | Status: DC

## 2021-02-20 NOTE — Progress Notes (Signed)
Nursing Pain Medication Assessment:  Safety precautions to be maintained throughout the outpatient stay will include: orient to surroundings, keep bed in low position, maintain call bell within reach at all times, provide assistance with transfer out of bed and ambulation.  Medication Inspection Compliance: Pill count conducted under aseptic conditions, in front of the patient. Neither the pills nor the bottle was removed from the patient's sight at any time. Once count was completed pills were immediately returned to the patient in their original bottle.  Medication: Tramadol (Ultram) Pill/Patch Count:  33 of 60 pills remain Pill/Patch Appearance: Markings consistent with prescribed medication Bottle Appearance: Standard pharmacy container. Clearly labeled. Filled Date: 6 / 60 / 2022 Last Medication intake:  Today

## 2021-02-20 NOTE — Patient Instructions (Signed)
____________________________________________________________________________________________  Preparing for Procedure with Sedation  Procedure appointments are limited to planned procedures: No Prescription Refills. No disability issues will be discussed. No medication changes will be discussed.  Instructions: Oral Intake: Do not eat or drink anything for at least 8 hours prior to your procedure. (Exception: Blood Pressure Medication. See below.) Transportation: Unless otherwise stated by your physician, you may drive yourself after the procedure. Blood Pressure Medicine: Do not forget to take your blood pressure medicine with a sip of water the morning of the procedure. If your Diastolic (lower reading)is above 100 mmHg, elective cases will be cancelled/rescheduled. Blood thinners: These will need to be stopped for procedures. Notify our staff if you are taking any blood thinners. Depending on which one you take, there will be specific instructions on how and when to stop it. Diabetics on insulin: Notify the staff so that you can be scheduled 1st case in the morning. If your diabetes requires high dose insulin, take only  of your normal insulin dose the morning of the procedure and notify the staff that you have done so. Preventing infections: Shower with an antibacterial soap the morning of your procedure. Build-up your immune system: Take 1000 mg of Vitamin C with every meal (3 times a day) the day prior to your procedure. Antibiotics: Inform the staff if you have a condition or reason that requires you to take antibiotics before dental procedures. Pregnancy: If you are pregnant, call and cancel the procedure. Sickness: If you have a cold, fever, or any active infections, call and cancel the procedure. Arrival: You must be in the facility at least 30 minutes prior to your scheduled procedure. Children: Do not bring children with you. Dress appropriately: Bring dark clothing that you would  not mind if they get stained. Valuables: Do not bring any jewelry or valuables.  Reasons to call and reschedule or cancel your procedure: (Following these recommendations will minimize the risk of a serious complication.) Surgeries: Avoid having procedures within 2 weeks of any surgery. (Avoid for 2 weeks before or after any surgery). Flu Shots: Avoid having procedures within 2 weeks of a flu shots or . (Avoid for 2 weeks before or after immunizations). Barium: Avoid having a procedure within 7-10 days after having had a radiological study involving the use of radiological contrast. (Myelograms, Barium swallow or enema study). Heart attacks: Avoid any elective procedures or surgeries for the initial 6 months after a "Myocardial Infarction" (Heart Attack). Blood thinners: It is imperative that you stop these medications before procedures. Let us know if you if you take any blood thinner.  Infection: Avoid procedures during or within two weeks of an infection (including chest colds or gastrointestinal problems). Symptoms associated with infections include: Localized redness, fever, chills, night sweats or profuse sweating, burning sensation when voiding, cough, congestion, stuffiness, runny nose, sore throat, diarrhea, nausea, vomiting, cold or Flu symptoms, recent or current infections. It is specially important if the infection is over the area that we intend to treat. Heart and lung problems: Symptoms that may suggest an active cardiopulmonary problem include: cough, chest pain, breathing difficulties or shortness of breath, dizziness, ankle swelling, uncontrolled high or unusually low blood pressure, and/or palpitations. If you are experiencing any of these symptoms, cancel your procedure and contact your primary care physician for an evaluation.  Remember:  Regular Business hours are:  Monday to Thursday 8:00 AM to 4:00 PM  Provider's Schedule: Milinda Pointer, MD:  Procedure days: Tuesday and  Thursday 7:30 AM  to 4:00 PM  Gillis Santa, MD:  Procedure days: Monday and Wednesday 7:30 AM to 4:00 PM ____________________________________________________________________________________________  ____________________________________________________________________________________________  Medication Rules  Purpose: To inform patients, and their family members, of our rules and regulations.  Applies to: All patients receiving prescriptions (written or electronic).  Pharmacy of record: Pharmacy where electronic prescriptions will be sent. If written prescriptions are taken to a different pharmacy, please inform the nursing staff. The pharmacy listed in the electronic medical record should be the one where you would like electronic prescriptions to be sent.  Electronic prescriptions: In compliance with the [place] (STOP) Act of 2017 (Session Lanny Cramp [phone]), effective August 11, 2018, all controlled substances must be electronically prescribed. Calling prescriptions to the pharmacy will cease to exist.  Prescription refills: Only during scheduled appointments. Applies to all prescriptions.  NOTE: The following applies primarily to controlled substances (Opioid* Pain Medications).   Type of encounter (visit): For patients receiving controlled substances, face-to-face visits are required. (Not an option or up to the patient.)  Patient's responsibilities: Pain Pills: Bring all pain pills to every appointment (except for procedure appointments). Pill Bottles: Bring pills in original pharmacy bottle. Always bring the newest bottle. Bring bottle, even if empty. Medication refills: You are responsible for knowing and keeping track of what medications you take and those you need refilled. The day before your appointment: write a list of all prescriptions that need to be refilled. The day of the appointment: give the list to the admitting nurse.  Prescriptions will be written only during appointments. No prescriptions will be written on procedure days. If you forget a medication: it will not be "Called in", "Faxed", or "electronically sent". You will need to get another appointment to get these prescribed. No early refills. Do not call asking to have your prescription filled early. Prescription Accuracy: You are responsible for carefully inspecting your prescriptions before leaving our office. Have the discharge nurse carefully go over each prescription with you, before taking them home. Make sure that your name is accurately spelled, that your address is correct. Check the name and dose of your medication to make sure it is accurate. Check the number of pills, and the written instructions to make sure they are clear and accurate. Make sure that you are given enough medication to last until your next medication refill appointment. Taking Medication: Take medication as prescribed. When it comes to controlled substances, taking less pills or less frequently than prescribed is permitted and encouraged. Never take more pills than instructed. Never take medication more frequently than prescribed.  Inform other Doctors: Always inform, all of your healthcare providers, of all the medications you take. Pain Medication from other Providers: You are not allowed to accept any additional pain medication from any other Doctor or Healthcare provider. There are two exceptions to this rule. (see below) In the event that you require additional pain medication, you are responsible for notifying us, as stated below. Cough Medicine: Often these contain an opioid, such as codeine or hydrocodone. Never accept or take cough medicine containing these opioids if you are already taking an opioid* medication. The combination may cause respiratory failure and death. Medication Agreement: You are responsible for carefully reading and following our Medication Agreement. This  must be signed before receiving any prescriptions from our practice. Safely store a copy of your signed Agreement. Violations to the Agreement will result in no further prescriptions. (Additional copies of our Medication Agreement are available upon request.) Laws,  Rules, & Regulations: All patients are expected to follow all Federal and [employer], [employer], Rules, [employer] [facility]. Ignorance of the Laws does not constitute a valid excuse.  Illegal drugs and Controlled Substances: The use of illegal substances (including, but not limited to marijuana and its derivatives) and/or the illegal use of any controlled substances is strictly prohibited. Violation of this rule may result in the immediate and permanent discontinuation of any and all prescriptions being written by our practice. The use of any illegal substances is prohibited. Adopted CDC guidelines & recommendations: Target dosing levels will be at or below 60 MME/day. Use of benzodiazepines** is not recommended.  Exceptions: There are only two exceptions to the rule of not receiving pain medications from other Healthcare Providers. Exception #1 (Emergencies): In the event of an emergency (i.e.: accident requiring emergency care), you are allowed to receive additional pain medication. However, you are responsible for: As soon as you are able, call our office [phone], at any time of the day or night, and leave a message stating your name, the date and nature of the emergency, and the name and dose of the medication prescribed. In the event that your call is answered by a member of our staff, make sure to document and save the date, time, and the name of the person that took your information.  Exception #2 (Planned Surgery): In the event that you are scheduled by another doctor or dentist to have any type of surgery or procedure, you are allowed (for a period no longer than 30 days), to receive additional pain medication, for the acute post-op pain.  However, in this case, you are responsible for picking up a copy of our "Post-op Pain Management for Surgeons" handout, and giving it to your surgeon or dentist. This document is available at our office, and does not require an appointment to obtain it. Simply go to our office during business hours (Monday-Thursday from 8:00 AM to 4:00 PM) (Friday 8:00 AM to 12:00 Noon) or if you have a scheduled appointment with [place], prior to your surgery, and ask for it by name. In addition, you are responsible for: calling our office [phone], at any time of the day or night, and leaving a message stating your name, name of your surgeon, type of surgery, and date of procedure or surgery. Failure to comply with your responsibilities may result in termination of therapy involving the controlled substances.  *Opioid medications include: morphine, codeine, oxycodone, oxymorphone, hydrocodone, hydromorphone, meperidine, tramadol, tapentadol, buprenorphine, fentanyl, methadone. **Benzodiazepine medications include: diazepam (Valium), alprazolam (Xanax), clonazepam (Klonopine), lorazepam (Ativan), clorazepate (Tranxene), chlordiazepoxide (Librium), estazolam (Prosom), oxazepam (Serax), temazepam (Restoril), triazolam (Halcion) (Last updated: 07/09/2020) ____________________________________________________________________________________________  ____________________________________________________________________________________________  Medication Recommendations and Reminders  Applies to: All patients receiving prescriptions (written and/or electronic).  Medication Rules & Regulations: These rules and regulations exist for your safety and that of others. They are not flexible and neither are we. Dismissing or ignoring them will be considered "non-compliance" with medication therapy, resulting in complete and irreversible termination of such therapy. (See document titled "Medication Rules" for more details.) In all  conscience, because of safety reasons, we cannot continue providing a therapy where the patient does not follow instructions.  Pharmacy of record:  Definition: This is the pharmacy where your electronic prescriptions will be sent.  We do not endorse any particular pharmacy, however, we have experienced problems with [facility] not securing enough medication supply for the community. We do not restrict you in your choice of  pharmacy. However, once we write for your prescriptions, we will NOT be re-sending more prescriptions to fix restricted supply problems created by your pharmacy, or your insurance.  The pharmacy listed in the electronic medical record should be the one where you want electronic prescriptions to be sent. If you choose to change pharmacy, simply notify our nursing staff.  Recommendations: Keep all of your pain medications in a safe place, under lock and key, even if you live alone. We will NOT replace lost, stolen, or damaged medication. After you fill your prescription, take 1 week's worth of pills and put them away in a safe place. You should keep a separate, properly labeled bottle for this purpose. The remainder should be kept in the original bottle. Use this as your primary supply, until it runs out. Once it's gone, then you know that you have 1 week's worth of medicine, and it is time to come in for a prescription refill. If you do this correctly, it is unlikely that you will ever run out of medicine. To make sure that the above recommendation works, it is very important that you make sure your medication refill appointments are scheduled at least 1 week before you run out of medicine. To do this in an effective manner, make sure that you do not leave the office without scheduling your next medication management appointment. Always ask the nursing staff to show you in your prescription , when your medication will be running out. Then arrange for the receptionist to get you a return  appointment, at least 7 days before you run out of medicine. Do not wait until you have 1 or 2 pills left, to come in. This is very poor planning and does not take into consideration that we may need to cancel appointments due to bad weather, sickness, or emergencies affecting our staff. DO NOT ACCEPT A "Partial Fill": If for any reason your pharmacy does not have enough pills/tablets to completely fill or refill your prescription, do not allow for a "partial fill". The law allows the pharmacy to complete that prescription within 72 hours, without requiring a new prescription. If they do not fill the rest of your prescription within those 72 hours, you will need a separate prescription to fill the remaining amount, which we will NOT provide. If the reason for the partial fill is your insurance, you will need to talk to the pharmacist about payment alternatives for the remaining tablets, but again, DO NOT ACCEPT A PARTIAL FILL, unless you can trust your pharmacist to obtain the remainder of the pills within 72 hours.  Prescription refills and/or changes in medication(s):  Prescription refills, and/or changes in dose or medication, will be conducted only during scheduled medication management appointments. (Applies to both, written and electronic prescriptions.) No refills on procedure days. No medication will be changed or started on procedure days. No changes, adjustments, and/or refills will be conducted on a procedure day. Doing so will interfere with the diagnostic portion of the procedure. No phone refills. No medications will be "called into the pharmacy". No Fax refills. No weekend refills. No Holliday refills. No after hours refills.  Remember:  Business hours are:  Monday to Thursday 8:00 AM to 4:00 PM Provider's Schedule: Milinda Pointer, MD - Appointments are:  Medication management: Monday and Wednesday 8:00 AM to 4:00 PM Procedure day: Tuesday and Thursday 7:30 AM to 4:00 PM Gillis Santa, MD - Appointments are:  Medication management: Tuesday and Thursday 8:00 AM to 4:00 PM  Procedure day: Monday and Wednesday 7:30 AM to 4:00 PM (Last update: 02/29/2020) ____________________________________________________________________________________________  ____________________________________________________________________________________________  CBD (cannabidiol) WARNING  Applicable to: All individuals currently taking or considering taking CBD (cannabidiol) and, more important, all patients taking opioid analgesic controlled substances (pain medication). (Example: oxycodone; oxymorphone; hydrocodone; hydromorphone; morphine; methadone; tramadol; tapentadol; fentanyl; buprenorphine; butorphanol; dextromethorphan; meperidine; codeine; etc.)  Legal status: CBD remains a Schedule I drug prohibited for any use. CBD is illegal with one exception. In the [place], CBD has a limited Transport planner (FDA) approval for the treatment of two specific types of epilepsy disorders. Only one CBD product has been approved by the FDA for this purpose: "Epidiolex". FDA is aware that some companies are marketing products containing cannabis and cannabis-derived compounds in ways that violate the [employer], Drug and Cosmetic Act [facility] Act) and that may put the health and safety of consumers at risk. The FDA, a Federal agency, has not enforced the CBD status since 2018.   Legality: Some manufacturers ship CBD products nationally, which is illegal. Often such products are sold online and are therefore available throughout the country. CBD is openly sold in head shops and health food stores in some states where such sales have not been explicitly legalized. Selling unapproved products with unsubstantiated therapeutic claims is not only a violation of the law, but also can put patients at risk, as these products have not been proven to be safe or effective. Federal illegality makes it  difficult to conduct research on CBD.  Reference: "FDA Regulation of Cannabis and Cannabis-Derived Products, Including Cannabidiol (CBD)" - [employer]  Warning: CBD is not FDA approved and has not undergo the same manufacturing controls as prescription drugs.  This means that the purity and safety of available CBD may be questionable. Most of the time, despite manufacturer's claims, it is contaminated with THC (delta-9-tetrahydrocannabinol - the chemical in marijuana responsible for the "HIGH").  When this is the case, the [facility] contaminant will trigger a positive urine drug screen (UDS) test for Marijuana (carboxy-THC). Because a positive UDS for any illicit substance is a violation of our medication agreement, your opioid analgesics (pain medicine) may be permanently discontinued.  MORE ABOUT CBD  General Information: CBD  is a derivative of the Marijuana (cannabis sativa) plant discovered in 59. It is one of the 113 identified substances found in Marijuana. It accounts for up to 40% of the plant's extract. As of 2018, preliminary clinical studies on CBD included research for the treatment of anxiety, movement disorders, and pain. CBD is available and consumed in multiple forms, including inhalation of smoke or vapor, as an aerosol spray, and by mouth. It may be supplied as an oil containing CBD, capsules, dried cannabis, or as a liquid solution. CBD is thought not to be as psychoactive as THC (delta-9-tetrahydrocannabinol - the chemical in marijuana responsible for the "HIGH"). Studies suggest that CBD may interact with different biological target receptors in the body, including cannabinoid and other neurotransmitter receptors. As of 2018 the mechanism of action for its biological effects has not been determined.  Side-effects  Adverse reactions: Dry mouth, diarrhea, decreased  appetite, fatigue, drowsiness, malaise, weakness, sleep disturbances, and others.  Drug interactions: CBC may interact with other medications such as blood-thinners. (Last update: 03/17/2020) ____________________________________________________________________________________________  ____________________________________________________________________________________________  Drug Holidays (Slow)  What is a "Drug Holiday"? Drug Holiday: is the name given to the period of time during which a patient stops taking a medication(s) for the purpose of eliminating tolerance  to the drug.  Benefits Improved effectiveness of opioids. Decreased opioid dose needed to achieve benefits. Improved pain with lesser dose.  What is tolerance? Tolerance: is the progressive decreased in effectiveness of a drug due to its repetitive use. With repetitive use, the body gets use to the medication and as a consequence, it loses its effectiveness. This is a common problem seen with opioid pain medications. As a result, a larger dose of the drug is needed to achieve the same effect that used to be obtained with a smaller dose.  How long should a "Drug Holiday" last? You should stay off of the pain medicine for at least 14 consecutive days. (2 weeks)  Should I stop the medicine "cold [place]"? No. You should always coordinate with your Pain Specialist so that he/she can provide you with the correct medication dose to make the transition as smoothly as possible.  How do I stop the medicine? Slowly. You will be instructed to decrease the daily amount of pills that you take by one (1) pill every seven (7) days. This is called a "slow downward taper" of your dose. For example: if you normally take four (4) pills per day, you will be asked to drop this dose to three (3) pills per day for seven (7) days, then to two (2) pills per day for seven (7) days, then to one (1) per day for seven (7) days, and at the end of those  last seven (7) days, this is when the "Drug Holiday" would start.   Will I have withdrawals? By doing a "slow downward taper" like this one, it is unlikely that you will experience any significant withdrawal symptoms. Typically, what triggers withdrawals is the sudden stop of a high dose opioid therapy. Withdrawals can usually be avoided by slowly decreasing the dose over a prolonged period of time. If you do not follow these instructions and decide to stop your medication abruptly, withdrawals may be possible.  What are withdrawals? Withdrawals: refers to the wide range of symptoms that occur after stopping or dramatically reducing opiate drugs after heavy and prolonged use. Withdrawal symptoms do not occur to patients that use low dose opioids, or those who take the medication sporadically. Contrary to benzodiazepine (example: Valium, Xanax, etc.) or alcohol withdrawals ("Delirium Tremens"), opioid withdrawals are not lethal. Withdrawals are the physical manifestation of the body getting rid of the excess receptors.  Expected Symptoms Early symptoms of withdrawal may include: Agitation Anxiety Muscle aches Increased tearing Insomnia Runny nose Sweating Yawning  Late symptoms of withdrawal may include: Abdominal cramping Diarrhea Dilated pupils Goose bumps Nausea Vomiting  Will I experience withdrawals? Due to the slow nature of the taper, it is very unlikely that you will experience any.  What is a slow taper? Taper: refers to the gradual decrease in dose.  (Last update: 02/29/2020) ____________________________________________________________________________________________

## 2021-02-25 ENCOUNTER — Encounter: Payer: Medicare HMO | Admitting: Pain Medicine

## 2021-02-26 ENCOUNTER — Ambulatory Visit: Payer: Medicare HMO | Admitting: Pain Medicine

## 2021-02-26 LAB — TOXASSURE SELECT 13 (MW), URINE

## 2021-03-07 ENCOUNTER — Ambulatory Visit (INDEPENDENT_AMBULATORY_CARE_PROVIDER_SITE_OTHER): Payer: Medicare HMO

## 2021-03-07 ENCOUNTER — Ambulatory Visit (HOSPITAL_BASED_OUTPATIENT_CLINIC_OR_DEPARTMENT_OTHER): Payer: Medicare HMO | Admitting: Pain Medicine

## 2021-03-07 ENCOUNTER — Ambulatory Visit
Admission: RE | Admit: 2021-03-07 | Discharge: 2021-03-07 | Disposition: A | Discharge status: Home / Self Care | Payer: Medicare HMO | Source: Ambulatory Visit | Attending: Pain Medicine | Admitting: Pain Medicine

## 2021-03-07 ENCOUNTER — Other Ambulatory Visit: Payer: Self-pay

## 2021-03-07 ENCOUNTER — Encounter: Payer: Self-pay | Admitting: Pain Medicine

## 2021-03-07 VITALS — BP 141/70 | HR 52 | Temp 97.5°F | Resp 18 | Ht 66.0 in | Wt 145.0 lb

## 2021-03-07 DIAGNOSIS — M25461 Effusion, right knee: Secondary | ICD-10-CM

## 2021-03-07 DIAGNOSIS — M1711 Unilateral primary osteoarthritis, right knee: Secondary | ICD-10-CM

## 2021-03-07 DIAGNOSIS — Z96651 Presence of right artificial knee joint: Secondary | ICD-10-CM | POA: Diagnosis not present

## 2021-03-07 DIAGNOSIS — M25561 Pain in right knee: Secondary | ICD-10-CM | POA: Diagnosis not present

## 2021-03-07 DIAGNOSIS — G8918 Other acute postprocedural pain: Secondary | ICD-10-CM | POA: Diagnosis not present

## 2021-03-07 DIAGNOSIS — E538 Deficiency of other specified B group vitamins: Secondary | ICD-10-CM

## 2021-03-07 DIAGNOSIS — G8929 Other chronic pain: Secondary | ICD-10-CM

## 2021-03-07 MED ORDER — HYDROCODONE-ACETAMINOPHEN 5-325 MG PO TABS: 1.0000 | ORAL_TABLET | Freq: Two times a day (BID) | ORAL | 0 refills | Status: AC | PRN

## 2021-03-07 MED ORDER — METHYLPREDNISOLONE ACETATE 80 MG/ML IJ SUSP
80.0000 mg | Freq: Once | INTRAMUSCULAR | Status: AC
  Administered 2021-03-07: 80 mg
  Filled 2021-03-07: qty 1

## 2021-03-07 MED ORDER — LIDOCAINE HCL (PF) 2 % IJ SOLN
INTRAMUSCULAR | Status: AC
  Filled 2021-03-07: qty 5

## 2021-03-07 MED ORDER — ROPIVACAINE HCL 2 MG/ML IJ SOLN
9.0000 mL | Freq: Once | INTRAMUSCULAR | Status: AC
  Administered 2021-03-07: 9 mL via PERINEURAL

## 2021-03-07 MED ORDER — ROPIVACAINE HCL 2 MG/ML IJ SOLN
INTRAMUSCULAR | Status: AC
  Filled 2021-03-07: qty 20

## 2021-03-07 MED ORDER — CYANOCOBALAMIN 1000 MCG/ML IJ SOLN
1000.0000 ug | Freq: Once | INTRAMUSCULAR | Status: AC
  Administered 2021-03-07: 1000 ug via INTRAMUSCULAR

## 2021-03-07 MED ORDER — MIDAZOLAM HCL 5 MG/5ML IJ SOLN
1.0000 mg | INTRAMUSCULAR | Status: DC | PRN
  Administered 2021-03-07 (×2): 1 mg via INTRAVENOUS
  Filled 2021-03-07: qty 5

## 2021-03-07 MED ORDER — LACTATED RINGERS IV SOLN
1000.0000 mL | Freq: Once | INTRAVENOUS | Status: AC
Start: 2021-03-07 — End: 2021-03-07
  Administered 2021-03-07: 1000 mL via INTRAVENOUS

## 2021-03-07 MED ORDER — LIDOCAINE HCL 2 % IJ SOLN
20.0000 mL | Freq: Once | INTRAMUSCULAR | Status: AC
Start: 2021-03-07 — End: 2021-03-07
  Administered 2021-03-07: 400 mg

## 2021-03-07 NOTE — Patient Instructions (Addendum)
___________________________________________________________________________________________  Post-Radiofrequency (RF) Discharge Instructions  You have just completed a Radiofrequency Neurotomy.  The following instructions will provide you with information and guidelines for self-care upon discharge.  If at any time you have questions or concerns please call your physician. DO NOT DRIVE YOURSELF!!  Instructions: Apply ice: Fill a plastic sandwich bag with crushed ice. Cover it with a small towel and apply to injection site. Apply for 15 minutes then remove x 15 minutes. Repeat sequence on day of procedure, until you go to bed. The purpose is to minimize swelling and discomfort after procedure. Apply heat: Apply heat to procedure site starting the day following the procedure. The purpose is to treat any soreness and discomfort from the procedure. Food intake: No eating limitations, unless stipulated above.  Nevertheless, if you have had sedation, you may experience some nausea.  In this case, it may be wise to wait at least two hours prior to resuming regular diet. Physical activities: Keep activities to a minimum for the first 8 hours after the procedure. For the first 24 hours after the procedure, do not drive a motor vehicle,  Operate heavy machinery, power tools, or handle any weapons.  Consider walking with the use of an assistive device or accompanied by an adult for the first 24 hours.  Do not drink alcoholic beverages including beer.  Do not make any important decisions or sign any legal documents. Go home and rest today.  Resume activities tomorrow, as tolerated.  Use caution in moving about as you may experience mild leg weakness.  Use caution in cooking, use of household electrical appliances and climbing steps. Driving: If you have received any sedation, you are not allowed to drive for 24 hours after your procedure. Blood thinner: Restart your blood thinner 6 hours after your procedure. (Only  for those taking blood thinners) Insulin: As soon as you can eat, you may resume your normal dosing schedule. (Only for those taking insulin) Medications: May resume pre-procedure medications.  Do not take any drugs, other than what has been prescribed to you. Infection prevention: Keep procedure site clean and dry. Post-procedure Pain Diary: Extremely important that this be done correctly and accurately. Recorded information will be used to determine the next step in treatment. Pain evaluated is that of treated area only. Do not include pain from an untreated area. Complete every hour, on the hour, for the initial 8 hours. Set an alarm to help you do this part accurately. Do not go to sleep and have it completed later. It will not be accurate. Follow-up appointment: Keep your follow-up appointment after the procedure. Usually 2-6 weeks after radiofrequency. Bring you pain diary. The information collected will be essential for your long-term care.   Expect: From numbing medicine (AKA: Local Anesthetics): Numbness or decrease in pain. Onset: Full effect within 15 minutes of injected. Duration: It will depend on the type of local anesthetic used. On the average, 1 to 8 hours.  From steroids (when added): Decrease in swelling or inflammation. Once inflammation is improved, relief of the pain will follow. Onset of benefits: Depends on the amount of swelling present. The more swelling, the longer it will take for the benefits to be seen. In some cases, up to 10 days. Duration: Steroids will stay in the system x 2 weeks. Duration of benefits will depend on multiple posibilities including persistent irritating factors. From procedure: Some discomfort is to be expected once the numbing medicine wears off. In the case of radiofrequency procedures,  this may last as long as 6 weeks. Additional post-procedure pain medication is provided for this. Discomfort is minimized if ice aPain Management Discharge  Instructions  General Discharge Instructions :  If you need to reach your doctor call: Monday-Friday 8:00 am - 4:00 pm at [phone] or toll free [phone].  After clinic hours [phone] to have operator reach doctor.  Bring all of your medication bottles to all your appointments in the pain clinic.  To cancel or reschedule your appointment with Pain Management please remember to call 24 hours in advance to avoid a fee.  Refer to the educational materials which you have been given on: General Risks, I had my Procedure. Discharge Instructions, Post Sedation.  Post Procedure Instructions:  The drugs you were given will stay in your system until tomorrow, so for the next 24 hours you should not drive, make any legal decisions or drink any alcoholic beverages.  You may eat anything you prefer, but it is better to start with liquids then soups and crackers, and gradually work up to solid foods.  Please notify your doctor immediately if you have any unusual bleeding, trouble breathing or pain that is not related to your normal pain.  Depending on the type of procedure that was done, some parts of your body may feel week and/or numb.  This usually clears up by tonight or the next day.  Walk with the use of an assistive device or accompanied by an adult for the 24 hours.  You may use ice on the affected area for the first 24 hours.  Put ice in a Ziploc bag and cover with a towel and place against area 15 minutes on 15 minutes off.  You may switch to heat after 24 hours.Radiofrequency Lesioning Radiofrequency lesioning is a procedure that is performed to relieve pain. The procedure is often used for back, neck, or arm pain. Radiofrequency lesioning involves the use of a machine that creates radio waves to make heat. During the procedure, the heat is applied to the nerve that carries the pain signal. The heat damages the nerve and interferes with the pain signal. Pain relief usuallystarts  about 2 weeks after the procedure and lasts for 6 months to 1 year. You will be awake during the procedure. You will need to be able to talk withthe health care provider during the procedure. Tell a health care provider about: Any allergies you have. All medicines you are taking, including vitamins, herbs, eye drops, creams, and over-the-counter medicines. Any problems you or family members have had with anesthetic medicines. Any blood disorders you have. Any surgeries you have had. Any medical conditions you have or have had. Whether you are pregnant or may be pregnant. What are the risks? Generally, this is a safe procedure. However, problems may occur, including: Pain or soreness at the injection site. Allergic reaction to medicines given during the procedure. Bleeding. Infection at the injection site. Damage to nerves or blood vessels. What happens before the procedure? Staying hydrated Follow instructions from your health care provider about hydration, which may include: Up to 2 hours before the procedure - you may continue to drink clear liquids, such as water, clear fruit juice, black coffee, and plain tea. Eating and drinking Follow instructions from your health care provider about eating and drinking, which may include: 8 hours before the procedure - stop eating heavy meals or foods, such as meat, fried foods, or fatty foods. 6 hours before the procedure - stop eating light meals  or foods, such as toast or cereal. 6 hours before the procedure - stop drinking milk or drinks that contain milk. 2 hours before the procedure - stop drinking clear liquids. Medicines Ask your health care provider about: Changing or stopping your regular medicines. This is especially important if you are taking diabetes medicines or blood thinners. Taking medicines such as aspirin and ibuprofen. These medicines can thin your blood. Do not take these medicines unless your health care provider tells you  to take them. Taking over-the-counter medicines, vitamins, herbs, and supplements. General instructions Plan to have someone take you home from the hospital or clinic. If you will be going home right after the procedure, plan to have someone with you for 24 hours. Ask your health care provider what steps will be taken to help prevent infection. These may include: Removing hair at the procedure site. Washing skin with a germ-killing soap. Taking antibiotic medicine. What happens during the procedure?  An IV will be inserted into one of your veins. You will be given one or more of the following: A medicine to help you relax (sedative). A medicine to numb the area (local anesthetic). Your health care provider will insert a radiofrequency needle into the area to be treated. This is done with the help of a type of X-ray (fluoroscopy). A wire that carries the radio waves (electrode) will be put through the radiofrequency needle. An electrical pulse will be sent through the electrode to verify the correct nerve that is causing your pain. You will feel a tingling sensation, and you may have muscle twitching. The tissue around the needle tip will be heated by an electric current that comes from the radiofrequency machine. This will numb the nerves. The needle will be removed. A bandage (dressing) will be put on the insertion area. The procedure may vary among health care providers and hospitals. What happens after the procedure? Your blood pressure, heart rate, breathing rate, and blood oxygen level will be monitored until you leave the hospital or clinic. Return to your normal activities as told by your health care provider. Ask your health care provider what activities are safe for you. Do not drive for 24 hours if you were given a sedative during your procedure. Summary Radiofrequency lesioning is a procedure that is performed to relieve pain. The procedure is often used for back, neck, or arm  pain. Radiofrequency lesioning involves the use of a machine that creates radio waves to make heat. Plan to have someone take you home from the hospital or clinic. Do not drive for 24 hours if you were given a sedative during your procedure. Return to your normal activities as told by your health care provider. Ask your health care provider what activities are safe for you. This information is not intended to replace advice given to you by your health care provider. Make sure you discuss any questions you have with your healthcare provider. Document Revised: 04/15/2018 Document Reviewed: 04/15/2018 Elsevier Patient Education  [place] are applied as instructed.  Call if: You experience numbness and weakness that gets worse with time, as opposed to wearing off. He experience any unusual bleeding, difficulty breathing, or loss of the ability to control your bowel and bladder. (This applies to Spinal procedures only) You experience any redness, swelling, heat, red streaks, elevated temperature, fever, or any other signs of a possible infection.  Emergency Numbers: [facility] business hours (Monday - Thursday, 8:00 AM - 4:00 PM) (Friday, 9:00 AM -  12:00 Noon): [phone] After hours: [phone] ____________________________________________________________________________________________

## 2021-03-07 NOTE — Progress Notes (Signed)
Patient presented for B 12 injection to right deltoid, patient voiced no concerns nor showed any signs of distress during injection. 

## 2021-03-07 NOTE — Progress Notes (Signed)
PROVIDER NOTE: Information contained herein reflects review and annotations entered in association with encounter. Interpretation of such information and data should be left to medically-trained personnel. Information provided to patient can be located elsewhere in the medical record under "Patient Instructions". Document created using STT-dictation technology, any transcriptional errors that may result from process are unintentional.    Patient: Ellen Drake  Service Category: Procedure  Provider: Gaspar Cola, MD  DOB: 13-Mar-1946  DOS: 03/07/2021  Location: [facility]  MRN: [id number]  Setting: Ambulatory - outpatient  Referring Provider: Einar Pheasant, MD  Type: Established Patient  Specialty: Interventional Pain Management  PCP: Einar Pheasant, MD   Primary Reason for Visit: Interventional Pain Management Treatment. CC: Knee Pain (right)  Procedure:          Anesthesia, Analgesia, Anxiolysis:  Type: Therapeutic Superolateral, Superomedial, and Inferomedial, Genicular Nerve Radiofrequency Ablation (destruction).            Region: Lateral, Anterior, and Medial aspects of the knee joint, above and below the knee joint proper. Level: Superior and inferior to the knee joint. Laterality: Right  Type: Minimal (Conscious) Anxiolysis combined with Local Anesthesia Indication(s): Analgesia and Anxiety Route: Intravenous (IV) IV Access: Secured Sedation: Meaningful verbal contact was maintained at all times during the procedure  Local Anesthetic: Lidocaine 1-2%  Position: Supine   Indications: 1. 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 Chronic knee pain (Right)   3. History of unicompartmental knee replacement (Right)   4. Osteoarthritis of knee (Right)   5. Pain and swelling of knee (Right)   6. S/P revision of total knee (Right)    Ellen Drake has been dealing with the above chronic pain for longer than three months and has either  failed to respond, was unable to tolerate, or simply did not get enough benefit from other more conservative therapies including, but not limited to: 1. Over-the-counter medications 2. Anti-inflammatory medications 3. Muscle relaxants 4. Membrane stabilizers 5. Opioids 6. Physical therapy and/or chiropractic manipulation 7. Modalities (Heat, ice, etc.) 8. Invasive techniques such as nerve blocks. Ellen Drake has attained more than 50% relief of the pain from a series of diagnostic injections conducted in separate occasions.  Pain Score: Pre-procedure: 10-Worst pain ever/10 Post-procedure: 0-No pain/10  Pre-op H&P Assessment:  [name] is a 75 y.o. (year old), female patient, seen today for interventional treatment. She  has a past surgical history that includes Back surgery (1992); anterior cervical discectomy and fusion (1998); Knee surgery (Right, 1/16); Colonoscopy (2009); Colon surgery (1998); Posterior laminectomy / decompression lumbar spine (1999); Rotator cuff repair (Left, 2007); Colonoscopy w/ biopsies (11/30/2012); Colectomy (2003); Replacement total knee; Knee arthroscopy w/ synovectomy (Right, 03/10/2014); Colonoscopy w/ biopsies (04/15/2016); Cataract extraction w/PHACO (Right, 02/09/2019); and Breast biopsy (Left). Ellen Drake has a current medication list which includes the following prescription(s): amlodipine, aspirin, atenolol, brimonidine, clotrimazole-betamethasone, gabapentin, hydrocodone-acetaminophen, [START ON 03/14/2021] hydrocodone-acetaminophen, latanoprost, lisinopril, nystatin cream, rosuvastatin, sulfasalazine, timolol, and tramadol, and the following Facility-Administered Medications: midazolam. Her primarily concern today is the Knee Pain (right)  Initial Vital Signs:  Pulse/HCG Rate: 60ECG Heart Rate: (!) 55 Temp: (!) 97.5 F (36.4 C) Resp: 14 BP: 123/74 SpO2: 99 %  BMI: Estimated body mass index is 23.4 kg/m as calculated from the following:   Height as of  this encounter: 5' 6"  (1.676 m).   Weight as of this encounter: 145 lb (65.8 kg).  Risk Assessment: Allergies: Reviewed. She is allergic to mercaptopurine,  lyrica [pregabalin], and dilaudid [hydromorphone hcl].  Allergy Precautions: None required Coagulopathies: Reviewed. None identified.  Blood-thinner therapy: None at this time Active Infection(s): Reviewed. None identified. Ellen Drake is afebrile  Site Confirmation: Ellen Drake was asked to confirm the procedure and laterality before marking the site Procedure checklist: Completed Consent: Before the procedure and under the influence of no sedative(s), amnesic(s), or anxiolytics, the patient was informed of the treatment options, risks and possible complications. To fulfill our ethical and legal obligations, as recommended by the American Medical Association's Code of Ethics, I have informed the patient of my clinical impression; the nature and purpose of the treatment or procedure; the risks, benefits, and possible complications of the intervention; the alternatives, including doing nothing; the risk(s) and benefit(s) of the alternative treatment(s) or procedure(s); and the risk(s) and benefit(s) of doing nothing. The patient was provided information about the general risks and possible complications associated with the procedure. These may include, but are not limited to: failure to achieve desired goals, infection, bleeding, organ or nerve damage, allergic reactions, paralysis, and death. In addition, the patient was informed of those risks and complications associated to the procedure, such as failure to decrease pain; infection; bleeding; organ or nerve damage with subsequent damage to sensory, motor, and/or autonomic systems, resulting in permanent pain, numbness, and/or weakness of one or several areas of the body; allergic reactions; (i.e.: anaphylactic reaction); and/or death. Furthermore, the patient was informed of those risks and  complications associated with the medications. These include, but are not limited to: allergic reactions (i.e.: anaphylactic or anaphylactoid reaction(s)); adrenal axis suppression; blood sugar elevation that in diabetics may result in ketoacidosis or comma; water retention that in patients with history of congestive heart failure may result in shortness of breath, pulmonary edema, and decompensation with resultant heart failure; weight gain; swelling or edema; medication-induced neural toxicity; particulate matter embolism and blood vessel occlusion with resultant organ, and/or nervous system infarction; and/or aseptic necrosis of one or more joints. Finally, the patient was informed that Medicine is not an exact science; therefore, there is also the possibility of unforeseen or unpredictable risks and/or possible complications that may result in a catastrophic outcome. The patient indicated having understood very clearly. We have given the patient no guarantees and we have made no promises. Enough time was given to the patient to ask questions, all of which were answered to the patient's satisfaction. Ms. Westermeyer has indicated that she wanted to continue with the procedure. Attestation: I, the ordering provider, attest that I have discussed with the patient the benefits, risks, side-effects, alternatives, likelihood of achieving goals, and potential problems during recovery for the procedure that I have provided informed consent. Date  Time: 03/07/2021 12:11 PM  Pre-Procedure Preparation:  Monitoring: As per clinic protocol. Respiration, ETCO2, SpO2, BP, heart rate and rhythm monitor placed and checked for adequate function Safety Precautions: Patient was assessed for positional comfort and pressure points before starting the procedure. Time-out: I initiated and conducted the "Time-out" before starting the procedure, as per protocol. The patient was asked to participate by confirming the accuracy of the  "Time Out" information. Verification of the correct person, site, and procedure were performed and confirmed by me, the nursing staff, and the patient. "Time-out" conducted as per Joint Commission's Universal Protocol (UP.01.01.01). Time: 1239  Description of Procedure:          Target Area: For Genicular Nerve radiofrequency ablation (destruction), the targets are: the superolateral genicular nerve, located in the lateral distal  portion of the femoral shaft as it curves to form the lateral epicondyle, in the region of the distal femoral metaphysis; the superomedial genicular nerve, located in the medial distal portion of the femoral shaft as it curves to form the medial epicondyle; and the inferomedial genicular nerve, located in the medial, proximal portion of the tibial shaft, as it curves to form the medial epicondyle, in the region of the proximal tibial metaphysis. Approach: Anterior, ipsilateral approach. Area Prepped: Entire knee area, from mid-thigh to mid-shin, lateral, anterior, and medial aspects. DuraPrep (Iodine Povacrylex [0.7% available iodine] and Isopropyl Alcohol, 74% w/w) Safety Precautions: Aspiration looking for blood return was conducted prior to all injections. At no point did we inject any substances, as a needle was being advanced. No attempts were made at seeking any paresthesias. Safe injection practices and needle disposal techniques used. Medications properly checked for expiration dates. SDV (single dose vial) medications used. Description of the Procedure: Protocol guidelines were followed. The patient was placed in position over the procedure table. The target area was identified and the area prepped in the usual manner. The skin and muscle were infiltrated with local anesthetic. Appropriate amount of time allowed to pass for local anesthetics to take effect. Radiofrequency needles were introduced to the target area using fluoroscopic guidance. Using the NeuroTherm NT1100  Radiofrequency Generator, sensory stimulation using 50 Hz was used to locate & identify the nerve, making sure that the needle was positioned such that there was no sensory stimulation below 0.3 V or above 0.7 V. Stimulation using 2 Hz was used to evaluate the motor component. Care was taken not to lesion any nerves that demonstrated motor stimulation of the lower extremities at an output of less than 2.5 times that of the sensory threshold, or a maximum of 2.0 V. Once satisfactory placement of the needles was achieved, the numbing solution was slowly injected after negative aspiration. After waiting for at least 2 minutes, the ablation was performed at 80 degrees C for 60 seconds, using regular Radiofrequency settings. Once the procedure was completed, the needles were then removed and the area cleansed, making sure to leave some of the prepping solution back to take advantage of its long term bactericidal properties. Intra-operative Compliance: Compliant  Vitals:   03/07/21 1315 03/07/21 1325 03/07/21 1335 03/07/21 1344  BP: 131/61 129/67 124/70 (!) 141/70  Pulse: (!) 52     Resp: 16 18 20 18   Temp:      TempSrc:      SpO2: 99% 96% 99% 100%  Weight:      Height:        Start Time: 1239 hrs. End Time: 1315 hrs. Materials & Medications:  Needle(s) Type: Teflon-coated, curved tip, Radiofrequency needle(s) Gauge: 22G Length: 10cm Medication(s): Please see orders for medications and dosing details.  Imaging Guidance (Non-Spinal):          Type of Imaging Technique: Fluoroscopy Guidance (Non-Spinal) Indication(s): Assistance in needle guidance and placement for procedures requiring needle placement in or near specific anatomical locations not easily accessible without such assistance. Exposure Time: Please see nurses notes. Contrast: Before injecting any contrast, we confirmed that the patient did not have an allergy to iodine, shellfish, or radiological contrast. Once satisfactory needle  placement was completed at the desired level, radiological contrast was injected. Contrast injected under live fluoroscopy. No contrast complications. See chart for type and volume of contrast used. Fluoroscopic Guidance: I was personally present during the use of fluoroscopy. "Tunnel Vision Technique" used to  obtain the best possible view of the target area. Parallax error corrected before commencing the procedure. "Direction-depth-direction" technique used to introduce the needle under continuous pulsed fluoroscopy. Once target was reached, antero-posterior, oblique, and lateral fluoroscopic projection used confirm needle placement in all planes. Images permanently stored in EMR. Interpretation: I personally interpreted the imaging intraoperatively. Adequate needle placement confirmed in multiple planes. Appropriate spread of contrast into desired area was observed. No evidence of afferent or efferent intravascular uptake. Permanent images saved into the patient's record.  Antibiotic Prophylaxis:   Anti-infectives (From admission, onward)    None      Indication(s): None identified  Post-operative Assessment:  Post-procedure Vital Signs:  Pulse/HCG Rate: (!) 52 (sb)(!) 56 Temp: (!) 97.5 F (36.4 C) Resp: 18 BP: (!) 141/70 SpO2: 100 %  EBL: None  Complications: No immediate post-treatment complications observed by team, or reported by patient.  Note: The patient tolerated the entire procedure well. A repeat set of vitals were taken after the procedure and the patient was kept under observation following institutional policy, for this type of procedure. Post-procedural neurological assessment was performed, showing return to baseline, prior to discharge. The patient was provided with post-procedure discharge instructions, including a section on how to identify potential problems. Should any problems arise concerning this procedure, the patient was given instructions to immediately contact  us, at any time, without hesitation. In any case, we plan to contact the patient by telephone for a follow-up status report regarding this interventional procedure.  Comments:  No additional relevant information.  Plan of Care  Orders:  Orders Placed This Encounter  Procedures   Radiofrequency,Genicular    Scheduling Instructions:     Side(s): Right Knee     Level(s): Superior-Lateral, Superior-Medial, and Inferior-Medial Genicular Nerve(s)     Sedation: With Sedation.     Timeframe: Today    Order Specific Question:   Where will this procedure be performed?    Answer:   [facility]   DG PAIN [facility] C-ARM 1-60 MIN NO REPORT    Intraoperative interpretation by procedural physician at [place].    Standing Status:   Standing    Number of Occurrences:   1    Order Specific Question:   Reason for exam:    Answer:   Assistance in needle guidance and placement for procedures requiring needle placement in or near specific anatomical locations not easily accessible without such assistance.   Informed Consent Details: Physician/Practitioner Attestation; Transcribe to consent form and obtain patient signature    Note: Always confirm laterality of pain with [name], before procedure. Transcribe to consent form and obtain patient signature.    Order Specific Question:   Physician/Practitioner attestation of informed consent for procedure/surgical case    Answer:   I, the physician/practitioner, attest that I have discussed with the patient the benefits, risks, side effects, alternatives, likelihood of achieving goals and potential problems during recovery for the procedure that I have provided informed consent.    Order Specific Question:   Procedure    Answer:   Radiofrequency ablation of the genicular nerves of the knee    Order Specific Question:   Physician/Practitioner performing the procedure    Answer:   Amaziah Raisanen A. Dossie Arbour, MD    Order Specific Question:    Indication/Reason    Answer:   Chronic knee pain   Provide equipment / supplies at bedside    "Radiofrequency Tray"; Large hemostat (x1); Small hemostat (x1); Towels (x8); 4x4 sterile  sponge pack (x1) Needle type: Teflon-coated Radiofrequency Needle (Disposable  single use) Size: Regular Quantity: 3    Standing Status:   Standing    Number of Occurrences:   1    Order Specific Question:   Specify    Answer:   Radiofrequency Tray    Chronic Opioid Analgesic:  Tramadol 50 mg 1 tab p.o. twice daily (100 mg/day of tramadol) (10 MME) MME/day: 10 mg/day   Medications ordered for procedure: Meds ordered this encounter  Medications   lidocaine (XYLOCAINE) 2 % (with pres) injection 400 mg   lactated ringers infusion 1,000 mL   midazolam (VERSED) 5 MG/5ML injection 1-2 mg    Make sure Flumazenil is available in the pyxis when using this medication. If oversedation occurs, administer 0.2 mg IV over 15 sec. If after 45 sec no response, administer 0.2 mg again over 1 min; may repeat at 1 min intervals; not to exceed 4 doses (1 mg)   methylPREDNISolone acetate (DEPO-MEDROL) injection 80 mg   ropivacaine (PF) 2 mg/mL (0.2%) (NAROPIN) injection 9 mL   HYDROcodone-acetaminophen (NORCO/VICODIN) 5-325 MG tablet    Sig: Take 1 tablet by mouth 2 (two) times daily as needed for up to 7 days for severe pain. Must last 7 days.    Dispense:  14 tablet    Refill:  0    For acute post-operative pain. Not to be refilled. Must last 7 days.   HYDROcodone-acetaminophen (NORCO/VICODIN) 5-325 MG tablet    Sig: Take 1 tablet by mouth 2 (two) times daily as needed for up to 7 days for severe pain. Must last for 7 days.    Dispense:  14 tablet    Refill:  0    For acute post-operative pain. Not to be refilled. Must last 7 days.    Medications administered: We administered lidocaine, lactated ringers, midazolam, methylPREDNISolone acetate, and ropivacaine (PF) 2 mg/mL (0.2%).  See the medical record for exact  dosing, route, and time of administration.  Follow-up plan:   Return in about 6 weeks (around 04/18/2021) for (VV) PM Proc-day (T,Th) (PPE).      Interventional Therapies  Risk  Complexity Considerations:   Estimated body mass index is 23.4 kg/m as calculated from the following:   Height as of this encounter: 5' 6"  (1.676 m).   Weight as of this encounter: 145 lb (65.8 kg). WNL   Planned  Pending:   Pending further evaluation   Under consideration:   Diagnostic right genicular nerves block #1  Possible right genicular nerves RFA #1    Completed:   None at this time   Therapeutic  Palliative (PRN) options:   None established       Recent Visits Date Type Provider Dept  02/20/21 Office Visit Milinda Pointer, MD [facility]  02/05/21 Office Visit Milinda Pointer, MD [facility]  01/17/21 Procedure visit Milinda Pointer, MD [facility]  01/14/21 Office Visit Milinda Pointer, MD [facility]  Showing recent visits within past 90 days and meeting all other requirements Today's Visits Date Type Provider Dept  03/07/21 Procedure visit Milinda Pointer, MD [facility]  Showing today's visits and meeting all other requirements Future Appointments Date Type Provider Dept  04/23/21 Appointment Milinda Pointer, MD [facility]  Showing future appointments within next 90 days and meeting all other requirements Disposition: Discharge home  Discharge (Date  Time): 03/07/2021; 1345 hrs.   Primary Care Physician: Einar Pheasant, MD Location: [facility]  Facility Note by: Gaspar Cola, MD Date: 03/07/2021; Time: 4:46 PM  Disclaimer:  Medicine is not an Chief Strategy Officer. The only guarantee in medicine is that nothing is guaranteed. It is important to note that the decision to proceed with this intervention was based on the information collected from the patient. The Data and conclusions  were drawn from the patient's questionnaire, the interview, and the physical examination. Because the information was provided in large part by the patient, it cannot be guaranteed that it has not been purposely or unconsciously manipulated. Every effort has been made to obtain as much relevant data as possible for this evaluation. It is important to note that the conclusions that lead to this procedure are derived in large part from the available data. Always take into account that the treatment will also be dependent on availability of resources and existing treatment guidelines, considered by other Pain Management Practitioners as being common knowledge and practice, at the time of the intervention. For Medico-Legal purposes, it is also important to point out that variation in procedural techniques and pharmacological choices are the acceptable norm. The indications, contraindications, technique, and results of the above procedure should only be interpreted and judged by a Board-Certified Interventional Pain Specialist with extensive familiarity and expertise in the same exact procedure and technique.

## 2021-03-08 ENCOUNTER — Telehealth: Payer: Self-pay

## 2021-03-08 NOTE — Telephone Encounter (Signed)
Post procedure phone call. Patient states she is doing good.  

## 2021-03-14 ENCOUNTER — Other Ambulatory Visit: Payer: Self-pay | Admitting: Pain Medicine

## 2021-03-14 ENCOUNTER — Other Ambulatory Visit: Payer: Self-pay | Admitting: Internal Medicine

## 2021-03-14 DIAGNOSIS — M25561 Pain in right knee: Secondary | ICD-10-CM

## 2021-03-14 DIAGNOSIS — G8929 Other chronic pain: Secondary | ICD-10-CM

## 2021-03-14 DIAGNOSIS — Z79891 Long term (current) use of opiate analgesic: Secondary | ICD-10-CM

## 2021-03-14 DIAGNOSIS — Z96651 Presence of right artificial knee joint: Secondary | ICD-10-CM

## 2021-03-14 DIAGNOSIS — Z79899 Other long term (current) drug therapy: Secondary | ICD-10-CM

## 2021-03-14 DIAGNOSIS — G894 Chronic pain syndrome: Secondary | ICD-10-CM

## 2021-03-18 ENCOUNTER — Telehealth: Payer: Self-pay | Admitting: Internal Medicine

## 2021-03-18 NOTE — Telephone Encounter (Signed)
If she is having increased nasal congestion and sinus pressure, then I recommend mucinex.  Continue saline nasal spray - flush nose 2x/day and can use nasacort nasal spray (2 sprays each nostril one time per day - do this in the evening).  This spray used to be prescription but is over the counter now.  If cough, can add delsym.  If persistent symptoms, will need to be evaluated.

## 2021-03-18 NOTE — Telephone Encounter (Signed)
Patient has been informed.

## 2021-03-18 NOTE — Telephone Encounter (Signed)
Patient informed, Due to the high volume of calls and your symptoms we have to forward your call to our Triage Nurse to expedient your call. Please hold for the transfer.  Patient transferred to Access Nurse. Due to not feeling better after seeing Dr.Scott 1 month ago and given a nasal spray.No available openings in office or virtual.

## 2021-03-18 NOTE — Telephone Encounter (Signed)
Patient was seen By Dr Nicki Reaper a month ago and was given nasal spray. Patient stated sx are still there. Wanted to know what to take otc or given different medication. Patient refused UC/ED per access nurse.

## 2021-03-27 ENCOUNTER — Telehealth: Payer: Self-pay | Admitting: Pain Medicine

## 2021-03-27 NOTE — Telephone Encounter (Signed)
Patient states she called pharmacy to refill meds and was told they do not have any scripts for Tramadol. In chart there is one she should have been able to pick up on 03-07-21 with 1 refill. Please call pharmacy and advise patient when she can fill this script. Thank you

## 2021-03-27 NOTE — Telephone Encounter (Signed)
Pharmacy called and script was there. Patient called and informed.

## 2021-04-09 ENCOUNTER — Ambulatory Visit (INDEPENDENT_AMBULATORY_CARE_PROVIDER_SITE_OTHER): Payer: Medicare HMO

## 2021-04-09 ENCOUNTER — Other Ambulatory Visit: Payer: Self-pay

## 2021-04-09 DIAGNOSIS — E538 Deficiency of other specified B group vitamins: Secondary | ICD-10-CM | POA: Diagnosis not present

## 2021-04-09 MED ORDER — CYANOCOBALAMIN 1000 MCG/ML IJ SOLN
1000.0000 ug | Freq: Once | INTRAMUSCULAR | Status: AC
  Administered 2021-04-09: 1000 ug via INTRAMUSCULAR

## 2021-04-09 NOTE — Progress Notes (Signed)
Patient presented for B 12 injection to left deltoid, patient voiced no concerns nor showed any signs of distress during injection. 

## 2021-04-12 ENCOUNTER — Other Ambulatory Visit: Payer: Self-pay | Admitting: Internal Medicine

## 2021-04-22 NOTE — Progress Notes (Signed)
Patient: Ellen Drake  Service Category: E/M  Provider: Gaspar Cola, MD  DOB: [birth year]  DOS: 04/23/2021  Location: Office  MRN: [id number]  Setting: Ambulatory outpatient  Referring Provider: Einar Pheasant, MD  Type: Established Patient  Specialty: Interventional Pain Management  PCP: Einar Pheasant, MD  Location: Remote location  Delivery: TeleHealth     Virtual Encounter - Pain Management PROVIDER NOTE: Information contained herein reflects review and annotations entered in association with encounter. Interpretation of such information and data should be left to medically-trained personnel. Information provided to patient can be located elsewhere in the medical record under "Patient Instructions". Document created using STT-dictation technology, any transcriptional errors that may result from process are unintentional.    Contact & Pharmacy Preferred: [phone] Home: [phone] (home) Mobile: There is no such number on file (mobile). E-mail: No e-mail address on record  [id number] [address]: [phone]: [phone]  Pre-screening  Ms. CRieraoffered "in-person" vs "virtual" encounter. She indicated preferring virtual for this encounter.   Reason COVID-19*  Social distancing based on CDC and AMA recommendations.   I contacted [name] on 04/23/2021 via telephone.      I clearly identified myself as FGaspar Cola MD. I verified that I was speaking with the correct person using two identifiers (Name: BSarieDay CGayton and date of birth: [birth year].  Consent I sought verbal advanced consent [facility]. I informed Ms. CDepaulaof possible security and privacy concerns, risks, and limitations associated with providing "not-in-person" medical evaluation and management services. I also informed Ms. CSalvadorof the availability of  "in-person" appointments. Finally, I informed her that there would be a charge for the virtual visit and that she could be  personally, fully or partially, financially responsible for it. Ms. CIllingworthexpressed understanding and agreed to proceed.   Historic Elements   [name] a 75o. year old, female patient evaluated today after our last contact on 03/27/2021. [name] has a past medical history of Anemia, Colon polyp (2009), Crohn's disease ([place], Diverticulosis, Fibrocystic breast disease, Herniated nucleus pulposus of lumbosacral region, History of chicken pox, Hypercholesterolemia, Hyperlipidemia, Hypertension, Osteoarthritis, and Pulmonary nodule seen on imaging study (2004). She also  has a past surgical history that includes Back surgery (1992); anterior cervical discectomy and fusion (1998); Knee surgery (Right, 1/16); Colonoscopy (2009); Colon surgery (1998); Posterior laminectomy / decompression lumbar spine (1999); Rotator cuff repair (Left, 2007); Colonoscopy w/ biopsies (11/30/2012); Colectomy (2003); Replacement total knee; Knee arthroscopy w/ synovectomy (Right, 03/10/2014); Colonoscopy w/ biopsies (04/15/2016); Cataract extraction w/PHACO (Right, 02/09/2019); and Breast biopsy (Left). Ms. CFillerhas a current medication list which includes the following prescription(s): amlodipine, aspirin, atenolol, brimonidine, clotrimazole-betamethasone, latanoprost, lisinopril, nystatin cream, rosuvastatin, sulfasalazine, timolol, gabapentin, and tramadol. She  reports that she quit smoking about 11 years ago. Her smoking use included cigarettes. She has never used smokeless tobacco. She reports current alcohol use of about 1.0 standard drink per week. She reports that she does not use drugs. Ms. CRockfordis allergic to mercaptopurine, lyrica [pregabalin], and dilaudid [hydromorphone hcl].   HPI  Today, she is being contacted for a post-procedure assessment.  Follow-up evaluation indicates  the patient to have attained excellent relief of her knee pain from the genicular radiofrequency ablation.  She indicates having attained 100% relief of the pain for approximately 2 weeks  which then started going down to about a 90% ongoing improvement.  She is still having some swelling and mechanical problems, but she admits the pain to have improved significantly.   The patient indicates doing well with the current medication regimen. No adverse reactions or side effects reported to the medications.  Today I have refilled her tramadol and gabapentin and I will see her back in about 30 days for medication management.  Post-Procedure Evaluation  Procedure (03/07/2021):  Procedure:           Anesthesia, Analgesia, Anxiolysis:  Type: Therapeutic Superolateral, Superomedial, and Inferomedial, Genicular Nerve Radiofrequency Ablation (destruction).            Region: Lateral, Anterior, and Medial aspects of the knee joint, above and below the knee joint proper. Level: Superior and inferior to the knee joint. Laterality: Right   Type: Minimal (Conscious) Anxiolysis combined with Local Anesthesia Indication(s): Analgesia and Anxiety Route: Intravenous (IV) IV Access: Secured Sedation: Meaningful verbal contact was maintained at all times during the procedure  Local Anesthetic: Lidocaine 1-2%   Position: Supine    Indications: 1. 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 Chronic knee pain (Right)   3. History of unicompartmental knee replacement (Right)   4. Osteoarthritis of knee (Right)   5. Pain and swelling of knee (Right)   6. S/P revision of total knee (Right)     [name] has been dealing with the above chronic pain for longer than three months and has either failed to respond, was unable to tolerate, or simply did not get enough benefit from other more conservative therapies including, but not limited to: 1. Over-the-counter medications 2. Anti-inflammatory  medications 3. Muscle relaxants 4. Membrane stabilizers 5. Opioids 6. Physical therapy and/or chiropractic manipulation 7. Modalities (Heat, ice, etc.) 8. Invasive techniques such as nerve blocks. Ms. Ladson has attained more than 50% relief of the pain from a series of diagnostic injections conducted in separate occasions.   Pain Score: Pre-procedure: 10-Worst pain ever/10 Post-procedure: 0-No pain/10  Anxiolysis: Minimal anxiolysis  Effectiveness during initial hour after procedure (Ultra-Short Term Relief): 100 %.  Local anesthetic used: Long-acting (4-6 hours) Effectiveness: Defined as any analgesic benefit obtained secondary to the administration of local anesthetics. This carries significant diagnostic value as to the etiological location, or anatomical origin, of the pain. Duration of benefit is expected to coincide with the duration of the local anesthetic used.  Effectiveness during initial 4-6 hours after procedure (Short-Term Relief): 100 %.  Long-term benefit: Defined as any relief past the pharmacologic duration of the local anesthetics.  Effectiveness past the initial 6 hours after procedure (Long-Term Relief): 100 %.  Benefits, current: Defined as benefit present at the time of this evaluation.   Analgesia: The patient indicates still having some swelling, but the pain is much better.  She indicates having an ongoing 90% improvement in her pain. Function: Somewhat improved ROM: Somewhat improved   Pharmacotherapy Assessment   Analgesic: Tramadol 50 mg 1 tab p.o. twice daily (100 mg/day of tramadol) (10 MME) MME/day: 10 mg/day   Monitoring: [place] PMP: PDMP reviewed during this encounter.       Pharmacotherapy: No side-effects or adverse reactions reported. Compliance: No problems identified. Effectiveness: Clinically acceptable. Plan: Refer to "POC". UDS:  Summary  Date Value Ref Range Status  02/20/2021 Note  Final    Comment:     ==================================================================== [employer] Select 13 (MW) ==================================================================== Test  Result       Flag       Units  Drug Present and Declared for Prescription Verification   Tramadol                       >3968        EXPECTED   ng/mg creat   O-Desmethyltramadol            >3968        EXPECTED   ng/mg creat   N-Desmethyltramadol            1874         EXPECTED   ng/mg creat    Source of tramadol is a prescription medication. O-desmethyltramadol    and N-desmethyltramadol are expected metabolites of tramadol.  ==================================================================== Test                      Result    Flag   Units      Ref Range   Creatinine              126              mg/dL      >=20 ==================================================================== Declared Medications:  The flagging and interpretation on this report are based on the  following declared medications.  Unexpected results may arise from  inaccuracies in the declared medications.   **Note: The testing scope of this panel includes these medications:   Tramadol (Ultram)   **Note: The testing scope of this panel does not include the  following reported medications:   Amlodipine  Aspirin  Atenolol  Betamethasone (Lotrisone)  Clotrimazole (Lotrisone)  Eye Drop  Gabapentin  Lisinopril  Nystatin  Rosuvastatin  Sulfasalazine ==================================================================== For clinical consultation, please call [phone]. ====================================================================      Laboratory Chemistry Profile   Renal Lab Results  Component Value Date   BUN 15 10/30/2020   CREATININE 0.79 10/30/2020   GFR 73.41 10/30/2020    Hepatic Lab Results  Component Value Date   AST 23 10/30/2020   ALT 16 10/30/2020   ALBUMIN 4.5 10/30/2020    ALKPHOS 73 10/30/2020    Electrolytes Lab Results  Component Value Date   NA 144 10/30/2020   K 3.7 10/30/2020   CL 107 10/30/2020   CALCIUM 9.7 10/30/2020   MG 2.0 01/14/2021    Bone Lab Results  Component Value Date   VD25OH 8.33 (L) 01/14/2021    Inflammation (CRP: Acute Phase) (ESR: Chronic Phase) Lab Results  Component Value Date   CRP 3.0 (H) 01/14/2021   ESRSEDRATE 31 (H) 01/14/2021         Note: Above Lab results reviewed.  Imaging  DG PAIN CLINIC C-ARM 1-60 MIN NO REPORT Fluoro was used, but no Radiologist interpretation will be provided.  Please refer to "NOTES" tab for provider progress note.  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  Plan of Care  Problem-specific:  No problem-specific Assessment & Plan notes found for this encounter.  Ms. Shealeigh Day Cantave has a current medication list which includes the following long-term medication(s): amlodipine, atenolol, lisinopril, rosuvastatin, sulfasalazine, gabapentin, and tramadol.  Pharmacotherapy (Medications Ordered): Meds ordered this encounter  Medications   gabapentin (NEURONTIN) 100 MG capsule  Sig: Take 1 capsule (100 mg total) by mouth daily with lunch AND 2 capsules (200 mg total) at bedtime. 126m only at night.    Dispense:  90 capsule    Refill:  0   traMADol (ULTRAM) 50 MG tablet    Sig: Take 1 tablet (50 mg total) by mouth 2 (two) times daily.    Dispense:  60 tablet    Refill:  0    Not a duplicate. Do NOT delete! Dispense 1 day early if closed on fill date. Warn not to take CNS-depressants 8 hours before or after taking opioid. Do not send refill request. Renewal requires appointment.   Orders:  No orders of the defined types were placed  in this encounter.  Follow-up plan:   Return in about 1 month (around 05/23/2021) for Eval-day(M,W), (F2F), (MM).     Interventional Therapies  Risk  Complexity Considerations:   Estimated body mass index is 23.4 kg/m as calculated from the following:   Height as of this encounter: _0  (1.676 m).   Weight as of this encounter: 145 lb (65.8 kg). WNL   Planned  Pending:   Pending further evaluation   Under consideration:   Diagnostic right genicular nerves block #1  Possible right genicular nerves RFA #1    Completed:   None at this time   Therapeutic  Palliative (PRN) options:   None established        Recent Visits Date Type Provider Dept  03/07/21 Procedure visit NMilinda Pointer MD [facility]  02/20/21 Office Visit NMilinda Pointer MD [facility]  02/05/21 Office Visit NMilinda Pointer MD [facility]  Showing recent visits within past 90 days and meeting all other requirements Today's Visits Date Type Provider Dept  04/23/21 Telemedicine NMilinda Pointer MD [facility]  Showing today's visits and meeting all other requirements Future Appointments No visits were found meeting these conditions. Showing future appointments within next 90 days and meeting all other requirements I discussed the assessment and treatment plan with the patient. The patient was provided an opportunity to ask questions and all were answered. The patient agreed with the plan and demonstrated an understanding of the instructions.  Patient advised to call back or seek an in-person evaluation if the symptoms or condition worsens.  Duration of encounter: 12 minutes.  Note by: FGaspar Cola MD Date: 04/23/2021; Time: 4:32 PM

## 2021-04-23 ENCOUNTER — Other Ambulatory Visit: Payer: Self-pay

## 2021-04-23 ENCOUNTER — Ambulatory Visit: Payer: Medicare HMO | Attending: Pain Medicine | Admitting: Pain Medicine

## 2021-04-23 DIAGNOSIS — Z96651 Presence of right artificial knee joint: Secondary | ICD-10-CM

## 2021-04-23 DIAGNOSIS — M25561 Pain in right knee: Secondary | ICD-10-CM

## 2021-04-23 DIAGNOSIS — M792 Neuralgia and neuritis, unspecified: Secondary | ICD-10-CM | POA: Diagnosis not present

## 2021-04-23 DIAGNOSIS — Z79899 Other long term (current) drug therapy: Secondary | ICD-10-CM

## 2021-04-23 DIAGNOSIS — Z79891 Long term (current) use of opiate analgesic: Secondary | ICD-10-CM | POA: Diagnosis not present

## 2021-04-23 DIAGNOSIS — G8929 Other chronic pain: Secondary | ICD-10-CM

## 2021-04-23 DIAGNOSIS — G894 Chronic pain syndrome: Secondary | ICD-10-CM

## 2021-04-23 MED ORDER — TRAMADOL HCL 50 MG PO TABS: 50.0000 mg | ORAL_TABLET | Freq: Two times a day (BID) | ORAL | 0 refills | Status: DC

## 2021-04-23 MED ORDER — GABAPENTIN 100 MG PO CAPS: ORAL_CAPSULE | ORAL | 0 refills | Status: DC

## 2021-04-24 ENCOUNTER — Telehealth: Payer: Self-pay | Admitting: Internal Medicine

## 2021-04-24 NOTE — Telephone Encounter (Signed)
Patient called in stating that she wants a cortisone shot for her left shoulder that she has had pain in for the last 3-4 months.Dr.Scott's next opening would be 10/28, offered to schedule patient with another provider but patient declined.Please advise.

## 2021-04-25 NOTE — Telephone Encounter (Signed)
Called patient to let her know that we do not do cortisone shots. She has an appt with pain clinic with in the next couple of weeks she is going to discuss with them. Pt was over due for follow up with pcp. Scheduled patient while on the phone.

## 2021-04-27 DIAGNOSIS — M7542 Impingement syndrome of left shoulder: Secondary | ICD-10-CM | POA: Diagnosis not present

## 2021-05-07 NOTE — Progress Notes (Signed)
PROVIDER NOTE: Information contained herein reflects review and annotations entered in association with encounter. Interpretation of such information and data should be left to medically-trained personnel. Information provided to patient can be located elsewhere in the medical record under "Patient Instructions". Document created using STT-dictation technology, any transcriptional errors that may result from process are unintentional.    Patient: Ellen Drake  Service Category: E/M  Provider: Gaspar Cola, MD  DOB: [birth year]  DOS: 05/08/2021  Specialty: Interventional Pain Management  MRN: [id number]  Setting: Ambulatory outpatient  PCP: Einar Pheasant, MD  Type: Established Patient    Referring Provider: Einar Pheasant, MD  Location: Office  Delivery: Face-to-face     HPI  Ellen Drake, a 75 y.o. year old female, is here today because of her Chronic pain of right knee [M25.561, G89.29]. Ellen Drake primary complain today is Knee Pain (right) Last encounter: My last encounter with her was on 03/27/2021. 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 Pain Assessment: Severity of Chronic pain is reported as a 0-No pain/10. Location: Knee Right/denies. Onset: More than a month ago. Quality: Throbbing, Aching. Timing: Intermittent. Modifying factor(s): medication. Vitals:  height is 5' 5"  (1.651 m) and weight is 140 lb (63.5 kg). Her temperature is 96.8 F (36 C) (abnormal). Her blood pressure is 175/78 (abnormal) and her pulse is 57 (abnormal). Her  respiration is 18 and oxygen saturation is 100%.   Reason for encounter: medication management.   The patient indicates doing well with the current medication regimen. No adverse reactions or side effects reported to the medications.  The patient was given a final warning regarding her noncompliance with bringing her bottles and medications to be can it.  The patient was informed that should she do that again, it is very likely that we will not be refilling her medications on that day.  She understood and accepted.  RTCB: 11/19/2021 Nonopioids transferred 05/08/2021: Gabapentin  Pharmacotherapy Assessment  Analgesic: Tramadol 50 mg 1 tab p.o. twice daily (100 mg/day of tramadol) (10 MME) MME/day: 10 mg/day   Monitoring: [place] PMP: PDMP not reviewed this encounter.       Pharmacotherapy: No side-effects or adverse reactions reported. Compliance: No problems identified. Effectiveness: Clinically acceptable.  Dewayne Shorter, RN  05/08/2021  8:17 AM  Sign when Signing Visit Nursing Pain Medication Assessment:  Safety precautions to be maintained throughout the outpatient stay will include: orient to surroundings, keep bed in low position, maintain call bell within reach at all times, provide assistance with transfer out of bed and ambulation.  Medication Inspection Compliance: Ellen Drake did not comply with our request to bring her pills to be counted. She was reminded that bringing the medication bottles, even when empty, is a requirement.  Medication: None brought in. Pill/Patch Count: None available to be counted. Bottle Appearance: No container available. Did not bring bottle(s) to appointment. Filled Date: N/A Last Medication intake:  Yesterday    UDS:  Summary  Date Value Ref Range Status  02/20/2021 Note  Final    Comment:    ==================================================================== [employer] [facility]  (MW) ==================================================================== Test  Result       Flag       Units  Drug Present and Declared for Prescription Verification   Tramadol                       >3968        EXPECTED   ng/mg creat   O-Desmethyltramadol            >3968        EXPECTED   ng/mg creat   N-Desmethyltramadol            1874         EXPECTED   ng/mg creat    Source of tramadol is a prescription medication. O-desmethyltramadol    and N-desmethyltramadol are expected metabolites of tramadol.  ==================================================================== Test                      Result    Flag   Units      Ref Range   Creatinine              126              mg/dL      >=20 ==================================================================== Declared Medications:  The flagging and interpretation on this report are based on the  following declared medications.  Unexpected results may arise from  inaccuracies in the declared medications.   **Note: The testing scope of this panel includes these medications:   Tramadol (Ultram)   **Note: The testing scope of this panel does not include the  following reported medications:   Amlodipine  Aspirin  Atenolol  Betamethasone (Lotrisone)  Clotrimazole (Lotrisone)  Eye Drop  Gabapentin  Lisinopril  Nystatin  Rosuvastatin  Sulfasalazine ==================================================================== For clinical consultation, please call [phone]. ====================================================================      ROS  Constitutional: Denies any fever or chills Gastrointestinal: No reported hemesis, hematochezia, vomiting, or acute GI distress Musculoskeletal: Denies any acute onset joint swelling, redness, loss of ROM, or weakness Neurological: No reported episodes of acute onset apraxia, aphasia, dysarthria, agnosia, amnesia, paralysis, loss of coordination, or loss  of consciousness  Medication Review  Aspirin, amLODipine, atenolol, brimonidine, clotrimazole-betamethasone, gabapentin, latanoprost, lisinopril, nystatin cream, rosuvastatin, sulfaSALAzine, timolol, and traMADol  History Review  Allergy: Ellen Drake is allergic to mercaptopurine, lyrica [pregabalin], and dilaudid [hydromorphone hcl]. Drug: Ellen Drake  reports no history of drug use. Alcohol:  reports current alcohol use of about 1.0 standard drink per week. Tobacco:  reports that she quit smoking about 11 years ago. Her smoking use included cigarettes. She has never used smokeless tobacco. Social: Ellen Drake  reports that she quit smoking about 11 years ago. Her smoking use included cigarettes. She has never used smokeless tobacco. She reports current alcohol use of about 1.0 standard drink per week. She reports that she does not use drugs. Medical:  has a past medical history of Anemia, Colon polyp (2009), Crohn's disease ([place]), Diverticulosis, Fibrocystic breast disease, Herniated nucleus pulposus of lumbosacral region, History of chicken pox, Hypercholesterolemia, Hyperlipidemia, Hypertension, Osteoarthritis, and Pulmonary nodule seen on imaging study (2004). Surgical: Ellen Drake  has a past surgical history that includes Back surgery (1992); anterior cervical discectomy and fusion (1998); Knee surgery (Right, 1/16); Colonoscopy (2009); Colon surgery (1998); Posterior laminectomy / decompression lumbar spine (1999); Rotator cuff repair (Left, 2007); Colonoscopy w/ biopsies (11/30/2012); Colectomy (2003); Replacement total knee; Knee arthroscopy w/ synovectomy (Right, 03/10/2014); Colonoscopy w/ biopsies (04/15/2016); Cataract extraction w/PHACO (Right, 02/09/2019);  and Breast biopsy (Left). Family: family history includes CVA in her mother; Hypertension in her mother and sister.  Laboratory Chemistry Profile   Renal Lab Results  Component Value Date   BUN 15 10/30/2020   CREATININE 0.79  10/30/2020   GFR 73.41 10/30/2020    Hepatic Lab Results  Component Value Date   AST 23 10/30/2020   ALT 16 10/30/2020   ALBUMIN 4.5 10/30/2020   ALKPHOS 73 10/30/2020    Electrolytes Lab Results  Component Value Date   NA 144 10/30/2020   K 3.7 10/30/2020   CL 107 10/30/2020   CALCIUM 9.7 10/30/2020   MG 2.0 01/14/2021    Bone Lab Results  Component Value Date   VD25OH 8.33 (L) 01/14/2021    Inflammation (CRP: Acute Phase) (ESR: Chronic Phase) Lab Results  Component Value Date   CRP 3.0 (H) 01/14/2021   ESRSEDRATE 31 (H) 01/14/2021         Note: Above Lab results reviewed.  Recent Imaging Review  DG PAIN CLINIC C-ARM 1-60 MIN NO REPORT Fluoro was used, but no Radiologist interpretation will be provided.  Please refer to "NOTES" tab for provider progress note. Note: Reviewed        Physical Exam  General appearance: Well nourished, well developed, and well hydrated. In no apparent acute distress Mental status: Alert, oriented x 3 (person, place, & time)       Respiratory: No evidence of acute respiratory distress Eyes: PERLA Vitals: BP (!) 175/78   Pulse (!) 57   Temp (!) 96.8 F (36 C)   Resp 18   Ht 5' 5"  (1.651 m)   Wt 140 lb (63.5 kg)   LMP 08/13/1995   SpO2 100%   BMI 23.30 kg/m  BMI: Estimated body mass index is 23.3 kg/m as calculated from the following:   Height as of this encounter: 5' 5"  (1.651 m).   Weight as of this encounter: 140 lb (63.5 kg). Ideal: Ideal body weight: 57 kg (125 lb 10.6 oz) Adjusted ideal body weight: 59.6 kg (131 lb 6.4 oz)  Assessment   Status Diagnosis  Controlled Controlled Controlled 1. Chronic knee pain (Right)   2. 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 Chronic pain syndrome   4. Pharmacologic therapy   5. Chronic use of opiate for therapeutic purpose   6. Encounter for medication management   7. Neurogenic pain      Updated Problems: No problems updated.  Plan of Care   Problem-specific:  No problem-specific Assessment & Plan notes found for this encounter.  Ellen Drake has a current medication list which includes the following long-term medication(s): amlodipine, atenolol, lisinopril, rosuvastatin, sulfasalazine, [START ON 05/23/2021] gabapentin, and [START ON 05/23/2021] tramadol.  Pharmacotherapy (Medications Ordered): Meds ordered this encounter  Medications   traMADol (ULTRAM) 50 MG tablet    Sig: Take 1 tablet (50 mg total) by mouth 2 (two) times daily.    Dispense:  60 tablet    Refill:  5    Not a duplicate. Do NOT delete! Dispense 1 day early if closed on fill date. Warn not to take CNS-depressants 8 hours before or after taking opioid. Do not send refill request. Renewal requires appointment.   gabapentin (NEURONTIN) 100 MG capsule    Sig: Take 2 capsules (200 mg total) by mouth at bedtime. 139m only at night    Dispense:  60 capsule    Refill:  5  Orders:  No orders of the defined types were placed in this encounter.  Follow-up plan:   Return in about 6 months (around 11/19/2021) for Eval-day (M,W), (F2F), (MM).     Interventional Therapies  Risk  Complexity Considerations:   Estimated body mass index is 23.4 kg/m as calculated from the following:   Height as of this encounter: 5' 6"  (1.676 m).   Weight as of this encounter: 145 lb (65.8 kg). WNL   Planned  Pending:   Pending further evaluation   Under consideration:   Diagnostic right genicular nerves block #1  Possible right genicular nerves RFA #1    Completed:   None at this time   Therapeutic  Palliative (PRN) options:   None established    Recent Visits Date Type Provider Dept  04/23/21 Telemedicine Milinda Pointer, MD [facility]  03/07/21 Procedure visit Milinda Pointer, MD [facility]  02/20/21 Office Visit Milinda Pointer, MD [facility]  Showing recent visits within past 90 days and meeting all other  requirements Today's Visits Date Type Provider Dept  05/08/21 Office Visit Milinda Pointer, MD [facility]  Showing today's visits and meeting all other requirements Future Appointments Date Type Provider Dept  07/24/21 Appointment Milinda Pointer, MD [facility]  Showing future appointments within next 90 days and meeting all other requirements I discussed the assessment and treatment plan with the patient. The patient was provided an opportunity to ask questions and all were answered. The patient agreed with the plan and demonstrated an understanding of the instructions.  Patient advised to call back or seek an in-person evaluation if the symptoms or condition worsens.  Duration of encounter: 30 minutes.  Note by: Gaspar Cola, MD Date: 05/08/2021; Time: 9:24 AM

## 2021-05-08 ENCOUNTER — Ambulatory Visit: Payer: Medicare HMO | Attending: Pain Medicine | Admitting: Pain Medicine

## 2021-05-08 ENCOUNTER — Encounter: Payer: Self-pay | Admitting: Pain Medicine

## 2021-05-08 ENCOUNTER — Other Ambulatory Visit: Payer: Self-pay

## 2021-05-08 VITALS — BP 175/78 | HR 57 | Temp 96.8°F | Resp 18 | Ht 65.0 in | Wt 140.0 lb

## 2021-05-08 DIAGNOSIS — Z96651 Presence of right artificial knee joint: Secondary | ICD-10-CM | POA: Insufficient documentation

## 2021-05-08 DIAGNOSIS — Z79899 Other long term (current) drug therapy: Secondary | ICD-10-CM | POA: Diagnosis not present

## 2021-05-08 DIAGNOSIS — M792 Neuralgia and neuritis, unspecified: Secondary | ICD-10-CM | POA: Insufficient documentation

## 2021-05-08 DIAGNOSIS — M25561 Pain in right knee: Secondary | ICD-10-CM | POA: Diagnosis not present

## 2021-05-08 DIAGNOSIS — G894 Chronic pain syndrome: Secondary | ICD-10-CM | POA: Diagnosis not present

## 2021-05-08 DIAGNOSIS — Z79891 Long term (current) use of opiate analgesic: Secondary | ICD-10-CM | POA: Diagnosis not present

## 2021-05-08 DIAGNOSIS — G8929 Other chronic pain: Secondary | ICD-10-CM | POA: Insufficient documentation

## 2021-05-08 MED ORDER — GABAPENTIN 100 MG PO CAPS: 200.0000 mg | ORAL_CAPSULE | Freq: Every day | ORAL | 5 refills | Status: DC

## 2021-05-08 MED ORDER — TRAMADOL HCL 50 MG PO TABS: 50.0000 mg | ORAL_TABLET | Freq: Two times a day (BID) | ORAL | 5 refills | Status: DC

## 2021-05-08 NOTE — Patient Instructions (Signed)
____________________________________________________________________________________________  Medication Rules  Purpose: To inform patients, and their family members, of our rules and regulations.  Applies to: All patients receiving prescriptions (written or electronic).  Pharmacy of record: Pharmacy where electronic prescriptions will be sent. If written prescriptions are taken to a different pharmacy, please inform the nursing staff. The pharmacy listed in the electronic medical record should be the one where you would like electronic prescriptions to be sent.  Electronic prescriptions: In compliance with the [place] (STOP) Act of 2017 (Session Lanny Cramp [id number]), effective August 11, 2018, all controlled substances must be electronically prescribed. Calling prescriptions to the pharmacy will cease to exist.  Prescription refills: Only during scheduled appointments. Applies to all prescriptions.  NOTE: The following applies primarily to controlled substances (Opioid* Pain Medications).   Type of encounter (visit): For patients receiving controlled substances, face-to-face visits are required. (Not an option or up to the patient.)  Patient's responsibilities: Pain Pills: Bring all pain pills to every appointment (except for procedure appointments). Pill Bottles: Bring pills in original pharmacy bottle. Always bring the newest bottle. Bring bottle, even if empty. Medication refills: You are responsible for knowing and keeping track of what medications you take and those you need refilled. The day before your appointment: write a list of all prescriptions that need to be refilled. The day of the appointment: give the list to the admitting nurse. Prescriptions will be written only during appointments. No prescriptions will be written on procedure days. If you forget a medication: it will not be "Called in", "Faxed", or "electronically sent". You will  need to get another appointment to get these prescribed. No early refills. Do not call asking to have your prescription filled early. Prescription Accuracy: You are responsible for carefully inspecting your prescriptions before leaving our office. Have the discharge nurse carefully go over each prescription with you, before taking them home. Make sure that your name is accurately spelled, that your address is correct. Check the name and dose of your medication to make sure it is accurate. Check the number of pills, and the written instructions to make sure they are clear and accurate. Make sure that you are given enough medication to last until your next medication refill appointment. Taking Medication: Take medication as prescribed. When it comes to controlled substances, taking less pills or less frequently than prescribed is permitted and encouraged. Never take more pills than instructed. Never take medication more frequently than prescribed.  Inform other Doctors: Always inform, all of your healthcare providers, of all the medications you take. Pain Medication from other Providers: You are not allowed to accept any additional pain medication from any other Doctor or Healthcare provider. There are two exceptions to this rule. (see below) In the event that you require additional pain medication, you are responsible for notifying us, as stated below. Cough Medicine: Often these contain an opioid, such as codeine or hydrocodone. Never accept or take cough medicine containing these opioids if you are already taking an opioid* medication. The combination may cause respiratory failure and death. Medication Agreement: You are responsible for carefully reading and following our Medication Agreement. This must be signed before receiving any prescriptions from our practice. Safely store a copy of your signed Agreement. Violations to the Agreement will result in no further prescriptions. (Additional copies of our  Medication Agreement are available upon request.) Laws, Rules, & Regulations: All patients are expected to follow all Federal and [employer], Rules, [employer]. Ignorance of  the Laws does not constitute a valid excuse.  Illegal drugs and Controlled Substances: The use of illegal substances (including, but not limited to marijuana and its derivatives) and/or the illegal use of any controlled substances is strictly prohibited. Violation of this rule may result in the immediate and permanent discontinuation of any and all prescriptions being written by our practice. The use of any illegal substances is prohibited. Adopted CDC guidelines & recommendations: Target dosing levels will be at or below 60 MME/day. Use of benzodiazepines** is not recommended.  Exceptions: There are only two exceptions to the rule of not receiving pain medications from other Healthcare Providers. Exception #1 (Emergencies): In the event of an emergency (i.e.: accident requiring emergency care), you are allowed to receive additional pain medication. However, you are responsible for: As soon as you are able, call our office [phone], at any time of the day or night, and leave a message stating your name, the date and nature of the emergency, and the name and dose of the medication prescribed. In the event that your call is answered by a member of our staff, make sure to document and save the date, time, and the name of the person that took your information.  Exception #2 (Planned Surgery): In the event that you are scheduled by another doctor or dentist to have any type of surgery or procedure, you are allowed (for a period no longer than 30 days), to receive additional pain medication, for the acute post-op pain. However, in this case, you are responsible for picking up a copy of our "Post-op Pain Management for Surgeons" handout, and giving it to your surgeon or dentist. This document is available at our office, and  does not require an appointment to obtain it. Simply go to our office during business hours (Monday-Thursday from 8:00 AM to 4:00 PM) (Friday 8:00 AM to 12:00 Noon) or if you have a scheduled appointment with [place], prior to your surgery, and ask for it by name. In addition, you are responsible for: calling our office [phone], at any time of the day or night, and leaving a message stating your name, name of your surgeon, type of surgery, and date of procedure or surgery. Failure to comply with your responsibilities may result in termination of therapy involving the controlled substances.  *Opioid medications include: morphine, codeine, oxycodone, oxymorphone, hydrocodone, hydromorphone, meperidine, tramadol, tapentadol, buprenorphine, fentanyl, methadone. **Benzodiazepine medications include: diazepam (Valium), alprazolam (Xanax), clonazepam (Klonopine), lorazepam (Ativan), clorazepate (Tranxene), chlordiazepoxide (Librium), estazolam (Prosom), oxazepam (Serax), temazepam (Restoril), triazolam (Halcion) (Last updated: 07/09/2020) ____________________________________________________________________________________________  ____________________________________________________________________________________________  Medication Recommendations and Reminders  Applies to: All patients receiving prescriptions (written and/or electronic).  Medication Rules & Regulations: These rules and regulations exist for your safety and that of others. They are not flexible and neither are we. Dismissing or ignoring them will be considered "non-compliance" with medication therapy, resulting in complete and irreversible termination of such therapy. (See document titled "Medication Rules" for more details.) In all conscience, because of safety reasons, we cannot continue providing a therapy where the patient does not follow instructions.  Pharmacy of record:  Definition: This is the pharmacy where your electronic  prescriptions will be sent.  We do not endorse any particular pharmacy, however, we have experienced problems with [facility] not securing enough medication supply for the community. We do not restrict you in your choice of pharmacy. However, once we write for your prescriptions, we will NOT be re-sending more prescriptions to fix restricted supply problems  created by your pharmacy, or your insurance.  The pharmacy listed in the electronic medical record should be the one where you want electronic prescriptions to be sent. If you choose to change pharmacy, simply notify our nursing staff.  Recommendations: Keep all of your pain medications in a safe place, under lock and key, even if you live alone. We will NOT replace lost, stolen, or damaged medication. After you fill your prescription, take 1 week's worth of pills and put them away in a safe place. You should keep a separate, properly labeled bottle for this purpose. The remainder should be kept in the original bottle. Use this as your primary supply, until it runs out. Once it's gone, then you know that you have 1 week's worth of medicine, and it is time to come in for a prescription refill. If you do this correctly, it is unlikely that you will ever run out of medicine. To make sure that the above recommendation works, it is very important that you make sure your medication refill appointments are scheduled at least 1 week before you run out of medicine. To do this in an effective manner, make sure that you do not leave the office without scheduling your next medication management appointment. Always ask the nursing staff to show you in your prescription , when your medication will be running out. Then arrange for the receptionist to get you a return appointment, at least 7 days before you run out of medicine. Do not wait until you have 1 or 2 pills left, to come in. This is very poor planning and does not take into consideration that we may need to  cancel appointments due to bad weather, sickness, or emergencies affecting our staff. DO NOT ACCEPT A "Partial Fill": If for any reason your pharmacy does not have enough pills/tablets to completely fill or refill your prescription, do not allow for a "partial fill". The law allows the pharmacy to complete that prescription within 72 hours, without requiring a new prescription. If they do not fill the rest of your prescription within those 72 hours, you will need a separate prescription to fill the remaining amount, which we will NOT provide. If the reason for the partial fill is your insurance, you will need to talk to the pharmacist about payment alternatives for the remaining tablets, but again, DO NOT ACCEPT A PARTIAL FILL, unless you can trust your pharmacist to obtain the remainder of the pills within 72 hours.  Prescription refills and/or changes in medication(s):  Prescription refills, and/or changes in dose or medication, will be conducted only during scheduled medication management appointments. (Applies to both, written and electronic prescriptions.) No refills on procedure days. No medication will be changed or started on procedure days. No changes, adjustments, and/or refills will be conducted on a procedure day. Doing so will interfere with the diagnostic portion of the procedure. No phone refills. No medications will be "called into the pharmacy". No Fax refills. No weekend refills. No Holliday refills. No after hours refills.  Remember:  Business hours are:  Monday to Thursday 8:00 AM to 4:00 PM Provider's Schedule: Milinda Pointer, MD - Appointments are:  Medication management: Monday and Wednesday 8:00 AM to 4:00 PM Procedure day: Tuesday and Thursday 7:30 AM to 4:00 PM Gillis Santa, MD - Appointments are:  Medication management: Tuesday and Thursday 8:00 AM to 4:00 PM Procedure day: Monday and Wednesday 7:30 AM to 4:00 PM (Last update:  02/29/2020) ____________________________________________________________________________________________

## 2021-05-08 NOTE — Progress Notes (Signed)
Nursing Pain Medication Assessment:  Safety precautions to be maintained throughout the outpatient stay will include: orient to surroundings, keep bed in low position, maintain call bell within reach at all times, provide assistance with transfer out of bed and ambulation.  Medication Inspection Compliance: Ms. Kaser did not comply with our request to bring her pills to be counted. She was reminded that bringing the medication bottles, even when empty, is a requirement.  Medication: None brought in. Pill/Patch Count: None available to be counted. Bottle Appearance: No container available. Did not bring bottle(s) to appointment. Filled Date: N/A Last Medication intake:  Yesterday

## 2021-05-13 ENCOUNTER — Telehealth: Payer: Self-pay | Admitting: *Deleted

## 2021-05-13 NOTE — Telephone Encounter (Signed)
Spoke with [facility] in [place], we have received a fax for Rx clarification.  The sig has 2 different directions as to how to take the Gabapentin.  I have sent a bubble to [facility] for clarification and a new Rx to be sent to pharmacy.   I have checked your note from 05/08/21 and the sig does have a discrepancy and the pharmacy will require another Rx to clarify the directions for the Rx.

## 2021-05-14 ENCOUNTER — Other Ambulatory Visit: Payer: Self-pay

## 2021-05-14 ENCOUNTER — Ambulatory Visit (INDEPENDENT_AMBULATORY_CARE_PROVIDER_SITE_OTHER): Payer: Medicare HMO

## 2021-05-14 DIAGNOSIS — E538 Deficiency of other specified B group vitamins: Secondary | ICD-10-CM

## 2021-05-14 MED ORDER — CYANOCOBALAMIN 1000 MCG/ML IJ SOLN
1000.0000 ug | Freq: Once | INTRAMUSCULAR | Status: AC
  Administered 2021-05-14: 1000 ug via INTRAMUSCULAR

## 2021-05-14 NOTE — Progress Notes (Signed)
Patient presented for B 12 injection to left deltoid, patient voiced no concerns nor showed any signs of distress during injection. 

## 2021-05-20 ENCOUNTER — Other Ambulatory Visit: Payer: Self-pay | Admitting: Internal Medicine

## 2021-05-29 DIAGNOSIS — Z5181 Encounter for therapeutic drug level monitoring: Secondary | ICD-10-CM | POA: Diagnosis not present

## 2021-05-29 DIAGNOSIS — Z9049 Acquired absence of other specified parts of digestive tract: Secondary | ICD-10-CM | POA: Diagnosis not present

## 2021-05-29 DIAGNOSIS — K5010 Crohn's disease of large intestine without complications: Secondary | ICD-10-CM | POA: Diagnosis not present

## 2021-05-29 DIAGNOSIS — Z23 Encounter for immunization: Secondary | ICD-10-CM | POA: Diagnosis not present

## 2021-06-05 ENCOUNTER — Encounter: Payer: Medicare HMO | Admitting: Pain Medicine

## 2021-06-06 ENCOUNTER — Other Ambulatory Visit: Payer: Self-pay

## 2021-06-06 ENCOUNTER — Ambulatory Visit (INDEPENDENT_AMBULATORY_CARE_PROVIDER_SITE_OTHER): Payer: Medicare HMO | Admitting: Internal Medicine

## 2021-06-06 VITALS — BP 104/70 | HR 66 | Temp 98.4°F | Resp 16 | Ht 65.0 in | Wt 147.6 lb

## 2021-06-06 DIAGNOSIS — K5010 Crohn's disease of large intestine without complications: Secondary | ICD-10-CM | POA: Diagnosis not present

## 2021-06-06 DIAGNOSIS — M25561 Pain in right knee: Secondary | ICD-10-CM | POA: Diagnosis not present

## 2021-06-06 DIAGNOSIS — G8929 Other chronic pain: Secondary | ICD-10-CM

## 2021-06-06 DIAGNOSIS — R051 Acute cough: Secondary | ICD-10-CM | POA: Diagnosis not present

## 2021-06-06 DIAGNOSIS — E7800 Pure hypercholesterolemia, unspecified: Secondary | ICD-10-CM

## 2021-06-06 DIAGNOSIS — R059 Cough, unspecified: Secondary | ICD-10-CM | POA: Diagnosis not present

## 2021-06-06 DIAGNOSIS — I10 Essential (primary) hypertension: Secondary | ICD-10-CM | POA: Diagnosis not present

## 2021-06-06 NOTE — Progress Notes (Signed)
Patient ID: Ellen Drake, female   DOB: Mar 28, 1946, 75 y.o.   MRN: [id number]   Virtual Visit via telephone Note  This visit type was conducted due to national recommendations for restrictions regarding the COVID-19 pandemic (e.g. social distancing).  This format is felt to be most appropriate for this patient at this time.  All issues noted in this document were discussed and addressed.  No physical exam was performed (except for noted visual exam findings with Video Visits).   I connected with Ellen Drake today by telephone and verified that I am speaking with the correct person using two identifiers. Location patient: home Location provider: work  Persons participating in the telephone visit: patient, provider  The limitations, risks, security and privacy concerns of performing an evaluation and management service by telephone and the availability of in person appointments have been discussed. It has also been discussed with the patient that there may be a patient responsible charge related to this service. The patient expressed understanding and agreed to proceed.  Reason for visit: follow up appt.   HPI: Follow up regarding her blood pressure.  Also has a history of Crohn's.  Saw Dr Redmond Pulling 05/29/21.  Stable.  Recommended f/u in one year.  States blood pressure ok.  Does report - starting yesterday - runny nose.  No fever.  Emesis x 2 yesterday am.  Increased cough.  Sneezing.  No chest pain or tightness.  No sob.     ROS: See pertinent positives and negatives per HPI.  Past Medical History:  Diagnosis Date   Anemia    reslved   Colon polyp 2009   Crohn's disease ([place])    Diverticulosis    Fibrocystic breast disease    Herniated nucleus pulposus of lumbosacral region    History of chicken pox    Hypercholesterolemia    Hyperlipidemia    Hypertension    Osteoarthritis    Pulmonary nodule seen on imaging study 2004   resolved 2007    Past Surgical History:  Procedure  Laterality Date   anterior cervical discectomy and fusion  1998   Dr Cherene Altes SURGERY  1992   ruptured disc (Dr Mauri Pole)   BREAST BIOPSY Left    neg-no scar seen   CATARACT EXTRACTION W/PHACO Right 02/09/2019   Procedure: CATARACT EXTRACTION PHACO AND INTRAOCULAR LENS PLACEMENT ([place]) [place]  RIGHT HEALON 5, VISION BLUE;  Surgeon: Leandrew Koyanagi, MD;  Location: [facility];  Service: Ophthalmology;  Laterality: Right;   COLECTOMY  2003   [facility]   COLONOSCOPY  2009   COLONOSCOPY W/ BIOPSIES  11/30/2012   Procedure: COLONOSCOPY W/BIOPSY; Surgeon: Colvin Caroli, MD; Location: [facility]; Service: Gastroenterology;;   COLONOSCOPY W/ BIOPSIES  04/15/2016   Procedure: Colonoscopy ; Surgeon: Colvin Caroli, MD; Location: [facility]; Service: Gastroenterology; Laterality: N/A;   KNEE ARTHROSCOPY W/ SYNOVECTOMY Right 03/10/2014   limited   KNEE SURGERY Right 1/16   [facility] (Dr. Sherlynn Carbon)   [place] / DECOMPRESSION LUMBAR SPINE  1999   REPLACEMENT TOTAL KNEE     ROTATOR CUFF REPAIR Left 2007   acute open; accident    Family History  Problem Relation Age of Onset   CVA Mother    Hypertension Mother    Hypertension Sister        brain tumor   Breast cancer Neg Hx    Colon cancer Neg Hx  SOCIAL HX: reviewed.    Current Outpatient Medications:    amLODipine (NORVASC) 10 MG tablet, TAKE 1 TABLET EVERY DAY, Disp: 90 tablet, Rfl: 1   ASPIRIN 81 PO, Take by mouth daily., Disp: , Rfl:    atenolol (TENORMIN) 50 MG tablet, TAKE 1 TABLET EVERY DAY, Disp: 90 tablet, Rfl: 1   brimonidine (ALPHAGAN) 0.2 % ophthalmic solution, 1 drop 2 (two) times daily. , Disp: , Rfl:    clotrimazole-betamethasone (LOTRISONE) cream, APPLY TO AFFECTED AREA TWICE A DAY, Disp: 30 g, Rfl: 0   gabapentin (NEURONTIN) 100 MG capsule, Take 2 capsules (200 mg total) by mouth at bedtime. 17m only at night, Disp: 60  capsule, Rfl: 5   latanoprost (XALATAN) 0.005 % ophthalmic solution, Place 1 drop into both eyes at bedtime. , Disp: , Rfl:    lisinopril (ZESTRIL) 40 MG tablet, TAKE 1 TABLET EVERY DAY, Disp: 90 tablet, Rfl: 1   nystatin cream (MYCOSTATIN), Apply 1 application topically 2 (two) times daily., Disp: 30 g, Rfl: 0   ondansetron (ZOFRAN) 4 MG tablet, Take 1 tablet (4 mg total) by mouth every 12 (twelve) hours as needed for nausea or vomiting., Disp: 10 tablet, Rfl: 0   rosuvastatin (CRESTOR) 10 MG tablet, TAKE 1 TABLET EVERY DAY, Disp: 90 tablet, Rfl: 1   sulfaSALAzine (AZULFIDINE) 500 MG tablet, Take 1,500 mg by mouth 2 (two) times daily. 3 tabs, Disp: , Rfl:    timolol (TIMOPTIC) 0.5 % ophthalmic solution, Place 1 drop into both eyes 2 (two) times daily., Disp: , Rfl: 3   traMADol (ULTRAM) 50 MG tablet, Take 1 tablet (50 mg total) by mouth 2 (two) times daily., Disp: 60 tablet, Rfl: 5  EXAM:  VITALS per patient if applicable: 1211/94 GENERAL: alert. Sounds to be in no acute distress.  Answering questions appropriately.   PSYCH/NEURO: pleasant and cooperative, no obvious depression or anxiety, speech and thought processing grossly intact  ASSESSMENT AND PLAN:  Discussed the following assessment and plan:  Problem List Items Addressed This Visit     Cough - Primary    Nasal congestion and cough as outlined.  Robitussin DM, saline nasal spray and steroid nasal spray as directed.   No chest pain or sob.  Will check covid/ RSV and Flu swab. Discussed quarantine guidelines. Discussed oral antivirals if positive.  Rest. Fluids.  Await culture results.  Call with update.  Evaluation if any change.       Relevant Orders   COVID-19, Flu A+B and RSV (Completed)   [place]    Followed by Dr WRedmond Pulling  Just evaluated.  Stable.  Recommended f/u in one year - due 05/2022      [place]    Stable.  Continue sulfasalazine.  Followed by Dr WRedmond Pulling       Hypercholesterolemia     Continue crestor.  Follow lipid panel and liver function tests.       Relevant Orders   Lipid panel   Hepatic function panel   Hypertension    Continue lisinopril, amlodipine and atenolol.   Follow pressures.  Follow metabolic panel.       Relevant Orders   Basic metabolic panel   CBC with Differential/Platelet   TSH   Chronic knee pain (Right) (Chronic)    Persistent increased pain.  Has seen multiple specialist and had several procedures. No relief.  Chronic pain and swelling.  Not a surgical candidate now.  Being followed by pain clinic.  Return in about 7 weeks (around 07/25/2021) for follow up appt (1mn) - 6-8 weeks.  .   I discussed the assessment and treatment plan with the patient. The patient was provided an opportunity to ask questions and all were answered. The patient agreed with the plan and demonstrated an understanding of the instructions.   The patient was advised to call back or seek an in-person evaluation if the symptoms worsen or if the condition fails to improve as anticipated.  I provided 23 minutes of non-face-to-face time during this encounter.   CEinar Pheasant MD

## 2021-06-06 NOTE — Patient Instructions (Signed)
Saline nasal spray - flush nose 2x/day  Robitussin DM twice a day as needed for cough and congestion.

## 2021-06-07 ENCOUNTER — Telehealth: Payer: Self-pay | Admitting: Internal Medicine

## 2021-06-07 ENCOUNTER — Other Ambulatory Visit: Payer: Self-pay

## 2021-06-07 LAB — COVID-19, FLU A+B AND RSV
Influenza A, NAA: NOT DETECTED
Influenza B, NAA: NOT DETECTED
RSV, NAA: NOT DETECTED
SARS-CoV-2, NAA: DETECTED — AB

## 2021-06-07 MED ORDER — MOLNUPIRAVIR EUA 200MG CAPSULE: 4.0000 | ORAL_CAPSULE | Freq: Two times a day (BID) | ORAL | 0 refills | Status: AC

## 2021-06-07 MED ORDER — ONDANSETRON HCL 4 MG PO TABS: 4.0000 mg | ORAL_TABLET | Freq: Two times a day (BID) | ORAL | 0 refills | Status: DC | PRN

## 2021-06-07 NOTE — Telephone Encounter (Signed)
Robitussin for congestion.  If cough - delsym cough syrup.  Ok to send in zofran 91m q 12 hours prn nausea #10 with no refills.  Rest.  Fluids.  Bland foods.  If any worsening or change in symptoms, needs to be evaluated.

## 2021-06-07 NOTE — Telephone Encounter (Signed)
Patient is sick on the stomach. Was told to call back if not better.

## 2021-06-07 NOTE — Telephone Encounter (Signed)
Patient confirmed she is not having any sob. Chest tightness etc. She is a little more congested. Still achy. She is nauseous but has not vomited today. Advised that we are still waiting on her swab to result to determine best treatment options.

## 2021-06-07 NOTE — Telephone Encounter (Signed)
Called Ellen Drake.  Discussed covid positive.  Discussed treatment and quarantine guidelines.  Agreeable to start antiviral.  Molnupiravir rx sent in to pharmacy.  See note.  Discussed if any change or worsening symptoms she is to be evaluated.

## 2021-06-07 NOTE — Telephone Encounter (Signed)
FYI- just checked results. Patient is COVID positive.

## 2021-06-07 NOTE — Telephone Encounter (Signed)
Patient aware of below. Zofran sent to pharmacy. Swab still pending.

## 2021-06-07 NOTE — Telephone Encounter (Signed)
Just checked again- still waiting for results of swab. You mentioned sending in zofran for nausea. I can send in just need to confirm dose and directions that you would like to send.

## 2021-06-11 NOTE — Telephone Encounter (Signed)
Pt called wanting to speak with nurse to touch base about past covid results/ symptoms. Pt was advised to call back to let us know how she is feeling. [phone]

## 2021-06-11 NOTE — Telephone Encounter (Signed)
Patient was calling to let us know that she is feeling better. Did not need anything at this time.

## 2021-06-13 ENCOUNTER — Telehealth: Payer: Self-pay | Admitting: Internal Medicine

## 2021-06-13 NOTE — Telephone Encounter (Signed)
Patient called and wanted to speak to [place]. She wanted to know if Larena Glassman showed the papers to Dr Nicki Reaper. She will be in the office on 06/14/2021 at 9:30 for a b12 and would like to pick up the papers.

## 2021-06-13 NOTE — Telephone Encounter (Signed)
Cannot find Handi cap placard paperwork?

## 2021-06-14 ENCOUNTER — Ambulatory Visit (INDEPENDENT_AMBULATORY_CARE_PROVIDER_SITE_OTHER): Payer: Medicare HMO

## 2021-06-14 ENCOUNTER — Other Ambulatory Visit: Payer: Self-pay

## 2021-06-14 DIAGNOSIS — E538 Deficiency of other specified B group vitamins: Secondary | ICD-10-CM | POA: Diagnosis not present

## 2021-06-14 MED ORDER — CYANOCOBALAMIN 1000 MCG/ML IJ SOLN
1000.0000 ug | Freq: Once | INTRAMUSCULAR | Status: AC
  Administered 2021-06-14: 1000 ug via INTRAMUSCULAR

## 2021-06-14 NOTE — Telephone Encounter (Signed)
Paper work completed and placed up front for patient.

## 2021-06-14 NOTE — Telephone Encounter (Signed)
Patient aware.

## 2021-06-14 NOTE — Progress Notes (Signed)
Patient presented for B 12 injection to left deltoid, patient voiced no concerns nor showed any signs of distress during injection. 

## 2021-06-16 ENCOUNTER — Encounter: Payer: Self-pay | Admitting: Internal Medicine

## 2021-06-16 DIAGNOSIS — R059 Cough, unspecified: Secondary | ICD-10-CM | POA: Insufficient documentation

## 2021-06-16 NOTE — Assessment & Plan Note (Signed)
Followed by Dr Redmond Pulling.  Just evaluated.  Stable.  Recommended f/u in one year - due 05/2022

## 2021-06-16 NOTE — Assessment & Plan Note (Signed)
Continue crestor.  Follow lipid panel and liver function tests.

## 2021-06-16 NOTE — Assessment & Plan Note (Signed)
Persistent increased pain.  Has seen multiple specialist and had several procedures. No relief.  Chronic pain and swelling.  Not a surgical candidate now.  Being followed by pain clinic.

## 2021-06-16 NOTE — Assessment & Plan Note (Addendum)
Nasal congestion and cough as outlined.  Robitussin DM, saline nasal spray and steroid nasal spray as directed.   No chest pain or sob.  Will check covid/ RSV and Flu swab. Discussed quarantine guidelines. Discussed oral antivirals if positive.  Rest. Fluids.  Await culture results.  Call with update.  Evaluation if any change.

## 2021-06-16 NOTE — Assessment & Plan Note (Signed)
Continue lisinopril, amlodipine and atenolol.   Follow pressures.  Follow metabolic panel.

## 2021-06-16 NOTE — Assessment & Plan Note (Signed)
Stable.  Continue sulfasalazine.  Followed by Dr Redmond Pulling.

## 2021-07-16 ENCOUNTER — Ambulatory Visit: Payer: Medicare HMO

## 2021-07-17 ENCOUNTER — Ambulatory Visit (INDEPENDENT_AMBULATORY_CARE_PROVIDER_SITE_OTHER): Payer: Medicare HMO

## 2021-07-17 VITALS — Ht 65.0 in | Wt 140.0 lb

## 2021-07-17 DIAGNOSIS — Z0000 Encounter for general adult medical examination without abnormal findings: Secondary | ICD-10-CM | POA: Diagnosis not present

## 2021-07-17 NOTE — Patient Instructions (Addendum)
Ellen Drake , Thank you for taking time to come for your Medicare Wellness Visit. I appreciate your ongoing commitment to your health goals. Please review the following plan we discussed and let me know if I can assist you in the future.   These are the goals we discussed:  Goals       Patient Stated     Follow up with Primary Care Provider (pt-stated)      As needed        This is a list of the screening recommended for you and due dates:  Health Maintenance  Topic Date Due   COVID-19 Vaccine (5 - Booster for Pfizer series) 08/02/2021*   Zoster (Shingles) Vaccine (1 of 2) 10/15/2021*   Tetanus Vaccine  07/17/2022*   Hepatitis C Screening: USPSTF Recommendation to screen - Ages 18-79 yo.  07/17/2022*   Mammogram  11/12/2021   Colon Cancer Screening  12/01/2022   Pneumonia Vaccine  Completed   Flu Shot  Completed   DEXA scan (bone density measurement)  Completed   HPV Vaccine  Aged Out  *Topic was postponed. The date shown is not the original due date.    Advanced directives: not yet completed  Conditions/risks identified: none new  Follow up in one year for your annual wellness visit    Preventive Care 65 Years and Older, Female Preventive care refers to lifestyle choices and visits with your health care provider that can promote health and wellness. What does preventive care include? A yearly physical exam. This is also called an annual well check. Dental exams once or twice a year. Routine eye exams. Ask your health care provider how often you should have your eyes checked. Personal lifestyle choices, including: Daily care of your teeth and gums. Regular physical activity. Eating a healthy diet. Avoiding tobacco and drug use. Limiting alcohol use. Practicing safe sex. Taking low-dose aspirin every day. Taking vitamin and mineral supplements as recommended by your health care provider. What happens during an annual well check? The services and screenings done by  your health care provider during your annual well check will depend on your age, overall health, lifestyle risk factors, and family history of disease. Counseling  Your health care provider may ask you questions about your: Alcohol use. Tobacco use. Drug use. Emotional well-being. Home and relationship well-being. Sexual activity. Eating habits. History of falls. Memory and ability to understand (cognition). Work and work Statistician. Reproductive health. Screening  You may have the following tests or measurements: Height, weight, and BMI. Blood pressure. Lipid and cholesterol levels. These may be checked every 5 years, or more frequently if you are over 49 years old. Skin check. Lung cancer screening. You may have this screening every year starting at age 93 if you have a 30-pack-year history of smoking and currently smoke or have quit within the past 15 years. Fecal occult blood test (FOBT) of the stool. You may have this test every year starting at age 27. Flexible sigmoidoscopy or colonoscopy. You may have a sigmoidoscopy every 5 years or a colonoscopy every 10 years starting at age 5. Hepatitis C blood test. Hepatitis B blood test. Sexually transmitted disease (STD) testing. Diabetes screening. This is done by checking your blood sugar (glucose) after you have not eaten for a while (fasting). You may have this done every 1-3 years. Bone density scan. This is done to screen for osteoporosis. You may have this done starting at age 17. Mammogram. This may be done every 1-2  years. Talk to your health care provider about how often you should have regular mammograms. Talk with your health care provider about your test results, treatment options, and if necessary, the need for more tests. Vaccines  Your health care provider may recommend certain vaccines, such as: Influenza vaccine. This is recommended every year. Tetanus, diphtheria, and acellular pertussis (Tdap, Td) vaccine. You  may need a Td booster every 10 years. Zoster vaccine. You may need this after age 25. Pneumococcal 13-valent conjugate (PCV13) vaccine. One dose is recommended after age 93. Pneumococcal polysaccharide (PPSV23) vaccine. One dose is recommended after age 44. Talk to your health care provider about which screenings and vaccines you need and how often you need them. This information is not intended to replace advice given to you by your health care provider. Make sure you discuss any questions you have with your health care provider. Document Released: 08/24/2015 Document Revised: 04/16/2016 Document Reviewed: 05/29/2015 Elsevier Interactive Patient Education  2017 [place] Prevention in the Home Falls can cause injuries. They can happen to people of all ages. There are many things you can do to make your home safe and to help prevent falls. What can I do on the outside of my home? Regularly fix the edges of walkways and driveways and fix any cracks. Remove anything that might make you trip as you walk through a door, such as a raised step or threshold. Trim any bushes or trees on the path to your home. Use bright outdoor lighting. Clear any walking paths of anything that might make someone trip, such as rocks or tools. Regularly check to see if handrails are loose or broken. Make sure that both sides of any steps have handrails. Any raised decks and porches should have guardrails on the edges. Have any leaves, snow, or ice cleared regularly. Use sand or salt on walking paths during winter. Clean up any spills in your garage right away. This includes oil or grease spills. What can I do in the bathroom? Use night lights. Install grab bars by the toilet and in the tub and shower. Do not use towel bars as grab bars. Use non-skid mats or decals in the tub or shower. If you need to sit down in the shower, use a plastic, non-slip stool. Keep the floor dry. Clean up any water that spills  on the floor as soon as it happens. Remove soap buildup in the tub or shower regularly. Attach bath mats securely with double-sided non-slip rug tape. Do not have throw rugs and other things on the floor that can make you trip. What can I do in the bedroom? Use night lights. Make sure that you have a light by your bed that is easy to reach. Do not use any sheets or blankets that are too big for your bed. They should not hang down onto the floor. Have a firm chair that has side arms. You can use this for support while you get dressed. Do not have throw rugs and other things on the floor that can make you trip. What can I do in the kitchen? Clean up any spills right away. Avoid walking on wet floors. Keep items that you use a lot in easy-to-reach places. If you need to reach something above you, use a strong step stool that has a grab bar. Keep electrical cords out of the way. Do not use floor polish or wax that makes floors slippery. If you must use wax, use non-skid floor  wax. Do not have throw rugs and other things on the floor that can make you trip. What can I do with my stairs? Do not leave any items on the stairs. Make sure that there are handrails on both sides of the stairs and use them. Fix handrails that are broken or loose. Make sure that handrails are as long as the stairways. Check any carpeting to make sure that it is firmly attached to the stairs. Fix any carpet that is loose or worn. Avoid having throw rugs at the top or bottom of the stairs. If you do have throw rugs, attach them to the floor with carpet tape. Make sure that you have a light switch at the top of the stairs and the bottom of the stairs. If you do not have them, ask someone to add them for you. What else can I do to help prevent falls? Wear shoes that: Do not have high heels. Have rubber bottoms. Are comfortable and fit you well. Are closed at the toe. Do not wear sandals. If you use a stepladder: Make  sure that it is fully opened. Do not climb a closed stepladder. Make sure that both sides of the stepladder are locked into place. Ask someone to hold it for you, if possible. Clearly mark and make sure that you can see: Any grab bars or handrails. First and last steps. Where the edge of each step is. Use tools that help you move around (mobility aids) if they are needed. These include: Canes. Walkers. Scooters. Crutches. Turn on the lights when you go into a dark area. Replace any light bulbs as soon as they burn out. Set up your furniture so you have a clear path. Avoid moving your furniture around. If any of your floors are uneven, fix them. If there are any pets around you, be aware of where they are. Review your medicines with your doctor. Some medicines can make you feel dizzy. This can increase your chance of falling. Ask your doctor what other things that you can do to help prevent falls. This information is not intended to replace advice given to you by your health care provider. Make sure you discuss any questions you have with your health care provider. Document Released: 05/24/2009 Document Revised: 01/03/2016 Document Reviewed: 09/01/2014 Elsevier Interactive Patient Education  2017 [place].  Opioid Pain Medicine Management Opioids are powerful medicines that are used to treat moderate to severe pain. When used for short periods of time, they can help you to: Sleep better. Do better in physical or occupational therapy. Feel better in the first few days after an injury. Recover from surgery. Opioids should be taken with the supervision of a trained health care provider. They should be taken for the shortest period of time possible. This is because opioids can be addictive, and the longer you take opioids, the greater your risk of addiction. This addiction can also be called opioid use disorder. What are the risks? Using opioid pain medicines for longer than 3 days  increases your risk of side effects. Side effects include: Constipation. Nausea and vomiting. Breathing difficulties (respiratory depression). Drowsiness. Confusion. Opioid use disorder. Itching. Taking opioid pain medicine for a long period of time can affect your ability to do daily tasks. It also puts you at risk for: Motor vehicle crashes. Depression. Suicide. Heart attack. Overdose, which can be life-threatening. What is a pain treatment plan? A pain treatment plan is an agreement between you and your health care provider.  Pain is unique to each person, and treatments vary depending on your condition. To manage your pain, you and your health care provider need to work together. To help you do this: Discuss the goals of your treatment, including how much pain you might expect to have and how you will manage the pain. Review the risks and benefits of taking opioid medicines. Remember that a good treatment plan uses more than one approach and minimizes the chance of side effects. Be honest about the amount of medicines you take and about any drug or alcohol use. Get pain medicine prescriptions from only one health care provider. Pain can be managed with many types of alternative treatments. Ask your health care provider to refer you to one or more specialists who can help you manage pain through: Physical or occupational therapy. Counseling (cognitive behavioral therapy). Good nutrition. Biofeedback. Massage. Meditation. Non-opioid medicine. Following a gentle exercise program. How to use opioid pain medicine Taking medicine Take your pain medicine exactly as told by your health care provider. Take it only when you need it. If your pain gets less severe, you may take less than your prescribed dose if your health care provider approves. If you are not having pain, do nottake pain medicine unless your health care provider tells you to take it. If your pain is severe, do nottry to  treat it yourself by taking more pills than instructed on your prescription. Contact your health care provider for help. Write down the times when you take your pain medicine. It is easy to become confused while on pain medicine. Writing the time can help you avoid overdose. Take other over-the-counter or prescription medicines only as told by your health care provider. Keeping yourself and others safe  While you are taking opioid pain medicine: Do not drive, use machinery, or power tools. Do not sign legal documents. Do not drink alcohol. Do not take sleeping pills. Do not supervise children by yourself. Do not do activities that require climbing or being in high places. Do not go to a lake, river, ocean, spa, or swimming pool. Do not share your pain medicine with anyone. Keep pain medicine in a locked cabinet or in a secure area where pets and children cannot reach it. Stopping your use of opioids If you have been taking opioid medicine for more than a few weeks, you may need to slowly decrease (taper) how much you take until you stop completely. Tapering your use of opioids can decrease your risk of symptoms of withdrawal, such as: Pain and cramping in the abdomen. Nausea. Sweating. Sleepiness. Restlessness. Uncontrollable shaking (tremors). Cravings for the medicine. Do not attempt to taper your use of opioids on your own. Talk with your health care provider about how to do this. Your health care provider may prescribe a step-down schedule based on how much medicine you are taking and how long you have been taking it. Getting rid of leftover pills Do not save any leftover pills. Get rid of leftover pills safely by: Taking the medicine to a prescription take-back program. This is usually offered by the county or law enforcement. Bringing them to a pharmacy that has a drug disposal container. Flushing them down the toilet. Check the label or package insert of your medicine to see  whether this is safe to do. Throwing them out in the trash. Check the label or package insert of your medicine to see whether this is safe to do. If it is safe to throw it out,  remove the medicine from the original container, put it into a sealable bag or container, and mix it with used coffee grounds, food scraps, dirt, or cat litter before putting it in the trash. Follow these instructions at home: Activity Do exercises as told by your health care provider. Avoid activities that make your pain worse. Return to your normal activities as told by your health care provider. Ask your health care provider what activities are safe for you. General instructions You may need to take these actions to prevent or treat constipation: Drink enough fluid to keep your urine pale yellow. Take over-the-counter or prescription medicines. Eat foods that are high in fiber, such as beans, whole grains, and fresh fruits and vegetables. Limit foods that are high in fat and processed sugars, such as fried or sweet foods. Keep all follow-up visits. This is important. Where to find support If you have been taking opioids for a long time, you may benefit from receiving support for quitting from a local support group or counselor. Ask your health care provider for a referral to these resources in your area. Where to find more information Centers for Disease Control and Prevention (CDC): http://www.wolf.info/ [place] Food and Drug Administration (FDA): [place] Get help right away if: You may have taken too much of an opioid (overdosed). Common symptoms of an overdose: Your breathing is slower or more shallow than normal. You have a very slow heartbeat (pulse). You have slurred speech. You have nausea and vomiting. Your pupils become very small. You have other potential symptoms: You are very confused. You faint or feel like you will faint. You have cold, clammy skin. You have blue lips or fingernails. You have thoughts  of harming yourself or harming others. These symptoms may represent a serious problem that is an emergency. Do not wait to see if the symptoms will go away. Get medical help right away. Call your local emergency services (911 in the [place] Do not drive yourself to the hospital.  If you ever feel like you may hurt yourself or others, or have thoughts about taking your own life, get help right away. Go to your nearest emergency department or: Call your local emergency services (911 in the [place] Call the [facility] (770) 677-7391 in the [place] Call a suicide crisis helpline, such as the [place] at (817)521-6569 or 988 in the [place]. This is open 24 hours a day in the [place] Text the Crisis Text Line at 219-822-5888 (in the [place].). Summary Opioid medicines can help you manage moderate to severe pain for a short period of time. A pain treatment plan is an agreement between you and your health care provider. Discuss the goals of your treatment, including how much pain you might expect to have and how you will manage the pain. If you think that you or someone else may have taken too much of an opioid, get medical help right away. This information is not intended to replace advice given to you by your health care provider. Make sure you discuss any questions you have with your health care provider. Document Revised: 02/20/2021 Document Reviewed: 11/07/2020 Elsevier Patient Education  [place].

## 2021-07-17 NOTE — Progress Notes (Addendum)
Subjective:   Ellen Drake is a 75 y.o. female who presents for Medicare Annual (Subsequent) preventive examination.  Review of Systems    No ROS.  Medicare Wellness Virtual Visit.  Visual/audio telehealth visit, UTA vital signs.   See social history for additional risk factors.   Cardiac Risk Factors include: advanced age (>65mn, >>38women);hypertension     Objective:    Today's Vitals   07/17/21 1117  Weight: 140 lb (63.5 kg)  Height: 5' 5"  (1.651 m)   Body mass index is 23.3 kg/m.  Advanced Directives 07/17/2021 05/08/2021 03/07/2021 02/05/2021 01/17/2021 07/16/2020 07/14/2019  Does Patient Have a Medical Advance Directive? No No No No No No No  Does patient want to make changes to medical advance directive? - - - - - No - Patient declined -  Would patient like information on creating a medical advance directive? No - Patient declined No - Patient declined - - - - Yes (MAU/Ambulatory/Procedural Areas - Information given)    Current Medications (verified) Outpatient Encounter Medications as of 07/17/2021  Medication Sig   amLODipine (NORVASC) 10 MG tablet TAKE 1 TABLET EVERY DAY   ASPIRIN 81 PO Take by mouth daily.   atenolol (TENORMIN) 50 MG tablet TAKE 1 TABLET EVERY DAY   brimonidine (ALPHAGAN) 0.2 % ophthalmic solution 1 drop 2 (two) times daily.    clotrimazole-betamethasone (LOTRISONE) cream APPLY TO AFFECTED AREA TWICE A DAY   gabapentin (NEURONTIN) 100 MG capsule Take 2 capsules (200 mg total) by mouth at bedtime. 1064monly at night   latanoprost (XALATAN) 0.005 % ophthalmic solution Place 1 drop into both eyes at bedtime.    lisinopril (ZESTRIL) 40 MG tablet TAKE 1 TABLET EVERY DAY   nystatin cream (MYCOSTATIN) Apply 1 application topically 2 (two) times daily.   ondansetron (ZOFRAN) 4 MG tablet Take 1 tablet (4 mg total) by mouth every 12 (twelve) hours as needed for nausea or vomiting.   rosuvastatin (CRESTOR) 10 MG tablet TAKE 1 TABLET EVERY DAY    sulfaSALAzine (AZULFIDINE) 500 MG tablet Take 1,500 mg by mouth 2 (two) times daily. 3 tabs   timolol (TIMOPTIC) 0.5 % ophthalmic solution Place 1 drop into both eyes 2 (two) times daily.   traMADol (ULTRAM) 50 MG tablet Take 1 tablet (50 mg total) by mouth 2 (two) times daily.   No facility-administered encounter medications on file as of 07/17/2021.    Allergies (verified) Mercaptopurine, Lyrica [pregabalin], and Dilaudid [hydromorphone hcl]   History: Past Medical History:  Diagnosis Date   Anemia    reslved   Colon polyp 2009   [place]   Diverticulosis    Fibrocystic breast disease    Herniated nucleus pulposus of lumbosacral region    History of chicken pox    Hypercholesterolemia    Hyperlipidemia    Hypertension    Osteoarthritis    Pulmonary nodule seen on imaging study 2004   resolved 2007   Past Surgical History:  Procedure Laterality Date   anterior cervical discectomy and fusion  1998   Dr DeCherene AltesURGERY  1992   ruptured disc (Dr CaMauri Pole  BREAST BIOPSY Left    neg-no scar seen   CATARACT EXTRACTION W/PHACO Right 02/09/2019   Procedure: CATARACT EXTRACTION PHACO AND INTRAOCULAR LENS PLACEMENT ([facility], VISION BLUE;  Surgeon: BrLeandrew KoyanagiMD;  [facility]: [facility];  Laterality: Right;   COLECTOMY  2003  [place]   COLONOSCOPY  2009   COLONOSCOPY W/ BIOPSIES  11/30/2012   Procedure: COLONOSCOPY W/BIOPSY; Surgeon: Colvin Caroli, MD; Location: [place]; Service: Gastroenterology;;   COLONOSCOPY W/ BIOPSIES  04/15/2016   Procedure: Colonoscopy ; Surgeon: Colvin Caroli, MD; Location: [place]; Service: Gastroenterology; Laterality: N/A;   KNEE ARTHROSCOPY W/ SYNOVECTOMY Right 03/10/2014   limited   KNEE SURGERY Right 1/16   [facility] (Dr. Sherlynn Carbon)   [place] / DECOMPRESSION LUMBAR SPINE  1999    REPLACEMENT TOTAL KNEE     ROTATOR CUFF REPAIR Left 2007   acute open; accident   Family History  Problem Relation Age of Onset   CVA Mother    Hypertension Mother    Hypertension Sister        brain tumor   Breast cancer Neg Hx    Colon cancer Neg Hx    Social History   Socioeconomic History   Marital status: Married    Spouse name: Not on file   Number of children: 0   Years of education: 12   Highest education level: Not on file  Occupational History   Not on file  Tobacco Use   Smoking status: Former    Types: Cigarettes    Quit date: 09/10/2009    Years since quitting: 11.8   Smokeless tobacco: Never  Vaping Use   Vaping Use: Never used  Substance and Sexual Activity   Alcohol use: Yes    Alcohol/week: 1.0 standard drink    Types: 1 Glasses of wine per week    Comment: wine occasional; socially   Drug use: No   Sexual activity: Yes  Other Topics Concern   Not on file  Social History Narrative   Not on file   Social Determinants of Health   Financial Resource Strain: Low Risk    Difficulty of Paying Living Expenses: Not hard at all  Food Insecurity: No Food Insecurity   Worried About Charity fundraiser in the Last Year: Never true   [place] in the Last Year: Never true  Transportation Needs: No Transportation Needs   Lack of Transportation (Medical): No   Lack of Transportation (Non-Medical): No  Physical Activity: Unknown   Days of Exercise per Week: 0 days   Minutes of Exercise per Session: Not on file  Stress: No Stress Concern Present   Feeling of Stress : Not at all  Social Connections: Unknown   Frequency of Communication with Friends and Family: More than three times a week   Frequency of Social Gatherings with Friends and Family: More than three times a week   Attends Religious Services: Not on Electrical engineer or Organizations: Yes   Attends Music therapist: More than 4 times per year   Marital Status:  Married    Tobacco Counseling Counseling given: Not Answered   Clinical Intake:  Pre-visit preparation completed: Yes        Diabetes: No  How often do you need to have someone help you when you read instructions, pamphlets, or other written materials from your doctor or pharmacy?: 1 - Never Interpreter Needed?: No      Activities of Daily Living In your present state of health, do you have any difficulty performing the following activities: 07/17/2021  Hearing? N  Vision? N  Difficulty concentrating or making decisions? N  Walking or climbing stairs? Y  Comment  Paces self. Chronic R knee pain  Dressing or bathing? N  Doing errands, shopping? N  Preparing Food and eating ? N  Using the Toilet? N  In the past six months, have you accidently leaked urine? N  Do you have problems with loss of bowel control? N  Managing your Medications? N  Managing your Finances? N  Housekeeping or managing your Housekeeping? N  Some recent data might be hidden    Patient Care Team: Einar Pheasant, MD as PCP - General (Internal Medicine)  Indicate any recent Medical Services you may have received from other than [facility] providers in the past year (date may be approximate).     Assessment:   This is a routine wellness examination for Ellen Drake.  Virtual Visit via Telephone Note  I connected with  Arnoldo Hooker Day Raj on 07/17/21 at 11:15 AM EST by telephone and verified that I am speaking with the correct person using two identifiers.  Persons participating in the virtual visit: patient/Nurse Health Advisor   I discussed the limitations, risks, security and privacy concerns of performing an evaluation and management service by telephone and the availability of in person appointments. The patient expressed understanding and agreed to proceed.  Interactive audio and video telecommunications were attempted between this nurse and patient, however failed, due to patient having technical  difficulties OR patient did not have access to video capability.  We continued and completed visit with audio only.  Some vital signs may be absent or patient reported.   Hearing/Vision screen Hearing Screening - Comments:: Patient is able to hear conversational tones without difficulty.  No issues reported. Vision Screening - Comments:: Followed by [facility]  Wears glasses  Semi-annual visits  Dietary issues and exercise activities discussed: Current Exercise Habits: Home exercise routine, Intensity: Mild Healthy, low salt Good water intake   Goals Addressed   None    Depression Screen PHQ 2/9 Scores 07/17/2021 05/08/2021 03/07/2021 02/05/2021 01/22/2021 01/17/2021 07/16/2020  PHQ - 2 Score 0 0 0 0 0 0 0    Fall Risk Fall Risk  07/17/2021 05/08/2021 03/07/2021 02/05/2021 01/22/2021  Falls in the past year? 0 0 0 0 0  Number falls in past yr: - - - - 0  Injury with Fall? - - - - 0  Follow up Falls evaluation completed - - - Falls evaluation completed    FALL RISK PREVENTION PERTAINING TO THE HOME: Home free of loose throw rugs in walkways, pet beds, electrical cords, etc? Yes  Adequate lighting in your home to reduce risk of falls? Yes   ASSISTIVE DEVICES UTILIZED TO PREVENT FALLS: Life alert? No  Use of a cane, walker or w/c? Yes , cane as needed Grab bars in the bathroom? No  Shower chair or bench in shower? No  Elevated toilet seat or a handicapped toilet? No   TIMED UP AND GO: Was the test performed? No .   Cognitive Function: MMSE - Mini Mental State Exam 07/21/2016 07/20/2015  Orientation to time 5 5  Orientation to Place 5 5  Registration 3 3  Attention/ Calculation 5 5  Recall 2 3  Recall-comments 2 out of 3 recalled -  Language- name 2 objects 2 2  Language- repeat 1 1  Language- follow 3 step command 3 3  Language- read & follow direction 1 1  Write a sentence 1 1  Copy design 1 1  Total score 29 30     6CIT Screen 07/16/2020 07/14/2019  What Year?  0  points 0 points  What month? 0 points 0 points  What time? 0 points 0 points  Count back from 20 0 points 0 points  Months in reverse 0 points 0 points    Immunizations Immunization History  Administered Date(s) Administered   Fluad Quad(high Dose 65+) 07/26/2019, 06/20/2020, 05/29/2021   Influenza Split 05/13/2011, 08/12/2012, 04/29/2014   Influenza, High Dose Seasonal PF 07/14/2016, 07/15/2017, 04/29/2018   Influenza, Seasonal, Injecte, Preservative Fre 06/05/2014   Influenza,inj,Quad PF,6+ Mos 05/06/2013, 06/08/2015   Moderna Sars-Covid-2 Vaccination 02/26/2021   PFIZER(Purple Top)SARS-COV-2 Vaccination 09/21/2019, 10/12/2019, 06/06/2020   Pneumococcal Conjugate-13 07/21/2016   Pneumococcal Polysaccharide-23 09/01/2014   TDAP status: Due, Education has been provided regarding the importance of this vaccine. Advised may receive this vaccine at local pharmacy or Health Dept. Aware to provide a copy of the vaccination record if obtained from local pharmacy or Health Dept. Verbalized acceptance and understanding. Deferred.   Shingrix Completed?: No.    Education has been provided regarding the importance of this vaccine. Patient has been advised to call insurance company to determine out of pocket expense if they have not yet received this vaccine. Advised may also receive vaccine at local pharmacy or Health Dept. Verbalized acceptance and understanding.  Screening Tests Health Maintenance  Topic Date Due   COVID-19 Vaccine (5 - Booster for Pfizer series) 08/02/2021 (Originally 04/23/2021)   Zoster Vaccines- Shingrix (1 of 2) 10/15/2021 (Originally 11/18/1964)   TETANUS/TDAP  07/17/2022 (Originally 11/18/1964)   Hepatitis C Screening  07/17/2022 (Originally 11/19/1963)   MAMMOGRAM  11/12/2021   COLONOSCOPY (Pts 45-14yr Insurance coverage will need to be confirmed)  12/01/2022   Pneumonia Vaccine 75 Years old  Completed   INFLUENZA VACCINE  Completed   DEXA SCAN  Completed   HPV  VACCINES  Aged Out   Health Maintenance There are no preventive care reminders to display for this patient.  Lung Cancer Screening: (Low Dose CT Chest recommended if Age 75-80years, 30 pack-year currently smoking OR have quit w/in 15years.) does not qualify.   Hepatitis C Screening: deferred.   Vision Screening: Recommended annual ophthalmology exams for early detection of glaucoma and other disorders of the eye.  Dental Screening: Recommended annual dental exams for proper oral hygiene  Community Resource Referral / Chronic Care Management: CRR required this visit?  No   CCM required this visit?  No      Plan:   Keep all routine maintenance appointments.   I have personally reviewed and noted the following in the patient's chart:   Medical and social history Use of alcohol, tobacco or illicit drugs  Current medications and supplements including opioid prescriptions. [facility]  Functional ability and status Nutritional status Physical activity Advanced directives List of other physicians Hospitalizations, surgeries, and ER visits in previous 12 months Vitals Screenings to include cognitive, depression, and falls Referrals and appointments  In addition, I have reviewed and discussed with patient certain preventive protocols, quality metrics, and best practice recommendations. A written personalized care plan for preventive services as well as general preventive health recommendations were provided to patient.     OVarney Biles LPN   [id number]

## 2021-07-18 ENCOUNTER — Ambulatory Visit (INDEPENDENT_AMBULATORY_CARE_PROVIDER_SITE_OTHER): Payer: Medicare HMO

## 2021-07-18 ENCOUNTER — Other Ambulatory Visit: Payer: Self-pay

## 2021-07-18 DIAGNOSIS — E538 Deficiency of other specified B group vitamins: Secondary | ICD-10-CM

## 2021-07-18 MED ORDER — CYANOCOBALAMIN 1000 MCG/ML IJ SOLN
1000.0000 ug | Freq: Once | INTRAMUSCULAR | Status: AC
  Administered 2021-07-18: 1000 ug via INTRAMUSCULAR

## 2021-07-18 NOTE — Progress Notes (Signed)
Patient presented for B 12 injection to right deltoid, patient voiced no concerns nor showed any signs of distress during injection. 

## 2021-07-23 NOTE — Progress Notes (Signed)
PROVIDER NOTE: Information contained herein reflects review and annotations entered in association with encounter. Interpretation of such information and data should be left to medically-trained personnel. Information provided to patient can be located elsewhere in the medical record under "Patient Instructions". Document created using STT-dictation technology, any transcriptional errors that may result from process are unintentional.    Patient: Ellen Drake  Service Category: E/M  Provider: Gaspar Cola, MD  DOB: [birth year]  DOS: 07/24/2021  Specialty: Interventional Pain Management  MRN: [id number]  Setting: Ambulatory outpatient  PCP: Einar Pheasant, MD  Type: Established Patient    Referring Provider: Einar Pheasant, MD  Location: Office  Delivery: Face-to-face     HPI  Ms. Island Day Claycomb, a 75 y.o. year old female, is here today because of her Chronic pain of right knee [M25.561, G89.29]. Ms. Tricarico primary complain today is Knee Pain (right) Last encounter: My last encounter with her was on 05/08/2021. Pertinent problems: Ms. Ebey has Arthritis; Chronic knee pain (1ry area of Pain) (Right); S/P revision of total knee (Right); Osteoarthritis of knee (Right); Right leg swelling; Right shoulder pain; Disorder of the skin and subcutaneous tissue, unspecified; Synovitis of knee; History of unicompartmental knee replacement (Right); Chronic pain syndrome; Chronic knee pain s/p TKR (Right); Pain and swelling of knee (Right); and Neurogenic pain on their pertinent problem list. Pain Assessment: Severity of Chronic pain is reported as a 7 /10. Location: Knee Right/Denies. Onset: More than a month ago. Quality: Aching, Constant. Timing: Constant. Modifying factor(s): meds. Vitals:  height is 5' 6"  (1.676 m) and weight is 140 lb (63.5 kg). Her temperature is 97.1 F (36.2 C) (abnormal). Her blood pressure is 150/79 (abnormal) and her pulse is 55 (abnormal). Her respiration is 17 and  oxygen saturation is 100%.   Reason for encounter: medication management.  Inadvertent discrepancy in prescription orders/instructions entered on last prescription for gabapentin pain.  This was corrected and resent today.  SCS info given. Stopped Gabapentin to see if she still needs it.  Today the patient indicated that she stopped the gabapentin a couple days ago because she was experiencing swelling in the right knee.  However, she denied any pedal swelling bilaterally.  This is what I would have expected with swelling of the lower extremities as a consequence of a medication, rather than preferential swelling only in the affected knee.  Furthermore, in reviewing her medical records, swelling of the knee was one of the complaints that she had on her initial evaluation, before she had taken any medications.  However, today I have given her the opportunity to go ahead and do this trial and see if she really needs the medication.  I have reminded her that just like it may take 3 weeks for the gabapentin to reach adequate steady levels in her bloodstream, and may also take that long to get out of her body and therefore if she begins to experience increased pain in the knee 2 to 3 weeks after she has stopped the gabapentin, it may be secondary to having stopped it.  She indicates having more pain in the right knee.  Her genicular nerve radiofrequency ablation was done on 03/07/2021 and on follow-up evaluation 6 weeks later she had indicated that she had attained an ongoing 100% relief of the pain.  If this benefit is now gone, 3 months down the road, this means that the duration of the radiofrequency is more on the short and of the spectrum then on the long end.  Because of this reason, today, rather than offering her a repeat radiofrequency, I have talked to her about other options which include a spinal cord stimulator trial and implant.  Information has been provided to the patient about this and I have  encouraged her to do some reading on the material and then to come back and asked me questions.  If she does decide to proceed with the trial, we will need to send her to a medical psychologist for an evaluation.  Today I did talk to her about all the steps necessary in order to get an implant including the medical psychology evaluation, and the trial.  At this point, it is too early for her to the side and therefore she has agreed to let us know later on if this is something that interests her.  For the time being, we will make no further changes.  I have sent another prescription for the gabapentin to the pharmacy so that she will have it available just in case she decides to go back on it.  RTCB: 11/19/2021 Nonopioids transferred 05/08/2021: Gabapentin  Pharmacotherapy Assessment  Analgesic: Tramadol 50 mg 1 tab p.o. twice daily (100 mg/day of tramadol) (10 MME) MME/day: 10 mg/day   Monitoring: [place] PMP: PDMP reviewed during this encounter.       Pharmacotherapy: No side-effects or adverse reactions reported. Compliance: No problems identified. Effectiveness: Clinically acceptable.  Chauncey Fischer, RN  07/24/2021  8:37 AM  Sign when Signing Visit Nursing Pain Medication Assessment:  Safety precautions to be maintained throughout the outpatient stay will include: orient to surroundings, keep bed in low position, maintain call bell within reach at all times, provide assistance with transfer out of bed and ambulation.  Medication Inspection Compliance: Pill count conducted under aseptic conditions, in front of the patient. Neither the pills nor the bottle was removed from the patient's sight at any time. Once count was completed pills were immediately returned to the patient in their original bottle.  Medication: Tramadol (Ultram) Pill/Patch Count:  25 of 60 pills remain Pill/Patch Appearance: Markings consistent with prescribed medication Bottle Appearance: Standard pharmacy container. Clearly  labeled. Filled Date: 70 / 17 / 2022 Last Medication intake:  Today Safety precautions to be maintained throughout the outpatient stay will include: orient to surroundings, keep bed in low position, maintain call bell within reach at all times, provide assistance with transfer out of bed and ambulation.      UDS:  Summary  Date Value Ref Range Status  02/20/2021 Note  Final    Comment:    ==================================================================== [employer] Select 13 (MW) ==================================================================== Test                             Result       Flag       Units  Drug Present and Declared for Prescription Verification   Tramadol                       >3968        EXPECTED   ng/mg creat   O-Desmethyltramadol            >3968        EXPECTED   ng/mg creat   N-Desmethyltramadol            1874         EXPECTED   ng/mg creat    Source of tramadol is  a prescription medication. O-desmethyltramadol    and N-desmethyltramadol are expected metabolites of tramadol.  ==================================================================== Test                      Result    Flag   Units      Ref Range   Creatinine              126              mg/dL      >=20 ==================================================================== Declared Medications:  The flagging and interpretation on this report are based on the  following declared medications.  Unexpected results may arise from  inaccuracies in the declared medications.   **Note: The testing scope of this panel includes these medications:   Tramadol (Ultram)   **Note: The testing scope of this panel does not include the  following reported medications:   Amlodipine  Aspirin  Atenolol  Betamethasone (Lotrisone)  Clotrimazole (Lotrisone)  Eye Drop  Gabapentin  Lisinopril  Nystatin  Rosuvastatin  Sulfasalazine ==================================================================== For  clinical consultation, please call [phone]. ====================================================================      ROS  Constitutional: Denies any fever or chills Gastrointestinal: No reported hemesis, hematochezia, vomiting, or acute GI distress Musculoskeletal: Denies any acute onset joint swelling, redness, loss of ROM, or weakness Neurological: No reported episodes of acute onset apraxia, aphasia, dysarthria, agnosia, amnesia, paralysis, loss of coordination, or loss of consciousness  Medication Review  Aspirin, amLODipine, atenolol, brimonidine, clotrimazole-betamethasone, gabapentin, latanoprost, lisinopril, nystatin cream, ondansetron, rosuvastatin, sulfaSALAzine, timolol, and traMADol  History Review  Allergy: Ms. Backes is allergic to mercaptopurine, lyrica [pregabalin], and dilaudid [hydromorphone hcl]. Drug: Ms. Weisbecker  reports no history of drug use. Alcohol:  reports current alcohol use of about 1.0 standard drink per week. Tobacco:  reports that she quit smoking about 11 years ago. Her smoking use included cigarettes. She has never used smokeless tobacco. Social: Ms. Coombes  reports that she quit smoking about 11 years ago. Her smoking use included cigarettes. She has never used smokeless tobacco. She reports current alcohol use of about 1.0 standard drink per week. She reports that she does not use drugs. Medical:  has a past medical history of Anemia, Colon polyp (2009), Crohn's disease ([place]), Diverticulosis, Fibrocystic breast disease, Herniated nucleus pulposus of lumbosacral region, History of chicken pox, Hypercholesterolemia, Hyperlipidemia, Hypertension, Osteoarthritis, and Pulmonary nodule seen on imaging study (2004). Surgical: Ms. Huot  has a past surgical history that includes Back surgery (1992); anterior cervical discectomy and fusion (1998); Knee surgery (Right, 1/16); Colonoscopy (2009); Colon surgery (1998); Posterior laminectomy / decompression lumbar  spine (1999); Rotator cuff repair (Left, 2007); Colonoscopy w/ biopsies (11/30/2012); Colectomy (2003); Replacement total knee; Knee arthroscopy w/ synovectomy (Right, 03/10/2014); Colonoscopy w/ biopsies (04/15/2016); Cataract extraction w/PHACO (Right, 02/09/2019); and Breast biopsy (Left). Family: family history includes CVA in her mother; Hypertension in her mother and sister.  Laboratory Chemistry Profile   Renal Lab Results  Component Value Date   BUN 15 10/30/2020   CREATININE 0.79 10/30/2020   GFR 73.41 10/30/2020    Hepatic Lab Results  Component Value Date   AST 23 10/30/2020   ALT 16 10/30/2020   ALBUMIN 4.5 10/30/2020   ALKPHOS 73 10/30/2020    Electrolytes Lab Results  Component Value Date   NA 144 10/30/2020   K 3.7 10/30/2020   CL 107 10/30/2020   CALCIUM 9.7 10/30/2020   MG 2.0 01/14/2021    Bone Lab Results  Component Value Date  VD25OH 8.33 (L) 01/14/2021    Inflammation (CRP: Acute Phase) (ESR: Chronic Phase) Lab Results  Component Value Date   CRP 3.0 (H) 01/14/2021   ESRSEDRATE 31 (H) 01/14/2021         Note: Above Lab results reviewed.  Recent Imaging Review  DG PAIN CLINIC C-ARM 1-60 MIN NO REPORT Fluoro was used, but no Radiologist interpretation will be provided.  Please refer to "NOTES" tab for provider progress note. Note: Reviewed        Physical Exam  General appearance: Well nourished, well developed, and well hydrated. In no apparent acute distress Mental status: Alert, oriented x 3 (person, place, & time)       Respiratory: No evidence of acute respiratory distress Eyes: PERLA Vitals: BP (!) 150/79    Pulse (!) 55    Temp (!) 97.1 F (36.2 C)    Resp 17    Ht 5' 6"  (1.676 m)    Wt 140 lb (63.5 kg)    LMP 08/13/1995    SpO2 100%    BMI 22.60 kg/m  BMI: Estimated body mass index is 22.6 kg/m as calculated from the following:   Height as of this encounter: 5' 6"  (1.676 m).   Weight as of this encounter: 140 lb (63.5 kg). Ideal:  Ideal body weight: 59.3 kg (130 lb 11.7 oz) Adjusted ideal body weight: 61 kg (134 lb 7 oz)  Assessment   Status Diagnosis  Worsening Worsening Stable 1. Chronic knee pain (1ry area of Pain) (Right)   2. Chronic knee pain s/p TKR (Right)   3. Chronic pain syndrome   4. Chronic knee pain (Right)   5. Chronic knee pain after total replacement of knee joint (Right)   6. Neurogenic pain      Updated Problems: Problem  Chronic knee pain s/p TKR (Right)   History of 3 prior knee surgeries with 2 of them being total knee replacement.   Chronic knee pain (1ry area of Pain) (Right)    Plan of Care  Problem-specific:  No problem-specific Assessment & Plan notes found for this encounter.  Ms. Treina Day Lowenstein has a current medication list which includes the following long-term medication(s): amlodipine, atenolol, lisinopril, rosuvastatin, sulfasalazine, tramadol, and gabapentin.  Pharmacotherapy (Medications Ordered): Meds ordered this encounter  Medications   gabapentin (NEURONTIN) 100 MG capsule    Sig: Take 2 capsules (200 mg total) by mouth at bedtime.    Dispense:  60 capsule    Refill:  5    Do not send refill request, medication transferred to PCP. Inform patient renewal requires appointment with PCP. Dispense 1 day early if closed on fill date. Generic permitted.    Orders:  No orders of the defined types were placed in this encounter.  Follow-up plan:   Return in about 4 months (around 11/19/2021) for Eval-day (M,W), (F2F), (MM).     Interventional Therapies  Risk   Complexity Considerations:   Estimated body mass index is 22.6 kg/m as calculated from the following:   Height as of this encounter: 5' 6"  (1.676 m).   Weight as of this encounter: 140 lb (63.5 kg). Allergy: Lyrica, Dilaudid   Planned   Pending:      Under consideration:   Diagnostic right Lumbar spinal cord stimulator trial    Completed:   Diagnostic right genicular NB x1 (01/17/2021)  (100/100/0/0)  Therapeutic right genicular nerve RFA x1 (03/07/2021) (100/100/100/90)    Therapeutic   Palliative (PRN) options:   Palliative  right genicular nerve RFA     Recent Visits Date Type Provider Dept  05/08/21 Office Visit Milinda Pointer, MD [facility]  Showing recent visits within past 90 days and meeting all other requirements Today's Visits Date Type Provider Dept  07/24/21 Office Visit Milinda Pointer, MD [facility]  Showing today's visits and meeting all other requirements Future Appointments No visits were found meeting these conditions. Showing future appointments within next 90 days and meeting all other requirements I discussed the assessment and treatment plan with the patient. The patient was provided an opportunity to ask questions and all were answered. The patient agreed with the plan and demonstrated an understanding of the instructions.  Patient advised to call back or seek an in-person evaluation if the symptoms or condition worsens.  Duration of encounter: 30 minutes.  Note by: Gaspar Cola, MD Date: 07/24/2021; Time: 9:22 AM

## 2021-07-24 ENCOUNTER — Ambulatory Visit: Payer: Medicare HMO | Attending: Pain Medicine | Admitting: Pain Medicine

## 2021-07-24 ENCOUNTER — Encounter: Payer: Self-pay | Admitting: Pain Medicine

## 2021-07-24 ENCOUNTER — Other Ambulatory Visit: Payer: Self-pay

## 2021-07-24 VITALS — BP 150/79 | HR 55 | Temp 97.1°F | Resp 17 | Ht 66.0 in | Wt 140.0 lb

## 2021-07-24 DIAGNOSIS — G894 Chronic pain syndrome: Secondary | ICD-10-CM | POA: Diagnosis not present

## 2021-07-24 DIAGNOSIS — G8929 Other chronic pain: Secondary | ICD-10-CM | POA: Diagnosis not present

## 2021-07-24 DIAGNOSIS — Z96651 Presence of right artificial knee joint: Secondary | ICD-10-CM | POA: Diagnosis not present

## 2021-07-24 DIAGNOSIS — M25561 Pain in right knee: Secondary | ICD-10-CM

## 2021-07-24 DIAGNOSIS — M792 Neuralgia and neuritis, unspecified: Secondary | ICD-10-CM | POA: Diagnosis not present

## 2021-07-24 MED ORDER — GABAPENTIN 100 MG PO CAPS: 200.0000 mg | ORAL_CAPSULE | Freq: Every day | ORAL | 5 refills | Status: DC

## 2021-07-24 NOTE — Patient Instructions (Signed)
Gabapentin Capsules or Tablets What is this medication? GABAPENTIN (GA ba pen tin) treats nerve pain. It may also be used to prevent and control seizures in people with epilepsy. It works by calming overactive nerves in your body. This medicine may be used for other purposes; ask your health care provider or pharmacist if you have questions. COMMON BRAND NAME(S): Active-PAC with Gabapentin, Orpha Bur, Gralise, Neurontin What should I tell my care team before I take this medication? They need to know if you have any of these conditions: Alcohol or substance use disorder Kidney disease Lung or breathing disease Suicidal thoughts, plans, or attempt; a previous suicide attempt by you or a family member An unusual or allergic reaction to gabapentin, other medications, foods, dyes, or preservatives Pregnant or trying to get pregnant Breast-feeding How should I use this medication? Take this medication by mouth with a glass of water. Follow the directions on the prescription label. You can take it with or without food. If it upsets your stomach, take it with food. Take your medication at regular intervals. Do not take it more often than directed. Do not stop taking except on your care team's advice. If you are directed to break the 600 or 800 mg tablets in half as part of your dose, the extra half tablet should be used for the next dose. If you have not used the extra half tablet within 28 days, it should be thrown away. A special MedGuide will be given to you by the pharmacist with each prescription and refill. Be sure to read this information carefully each time. Talk to your care team about the use of this medication in children. While this medication may be prescribed for children as young as 3 years for selected conditions, precautions do apply. Overdosage: If you think you have taken too much of this medicine contact a poison control center or emergency room at once. NOTE: This medicine is only for  you. Do not share this medicine with others. What if I miss a dose? If you miss a dose, take it as soon as you can. If it is almost time for your next dose, take only that dose. Do not take double or extra doses. What may interact with this medication? Alcohol Antihistamines for allergy, cough, and cold Certain medications for anxiety or sleep Certain medications for depression like amitriptyline, fluoxetine, sertraline Certain medications for seizures like phenobarbital, primidone Certain medications for stomach problems General anesthetics like halothane, isoflurane, methoxyflurane, propofol Local anesthetics like lidocaine, pramoxine, tetracaine Medications that relax muscles for surgery Opioid medications for pain Phenothiazines like chlorpromazine, mesoridazine, prochlorperazine, thioridazine This list may not describe all possible interactions. Give your health care provider a list of all the medicines, herbs, non-prescription drugs, or dietary supplements you use. Also tell them if you smoke, drink alcohol, or use illegal drugs. Some items may interact with your medicine. What should I watch for while using this medication? Visit your care team for regular checks on your progress. You may want to keep a record at home of how you feel your condition is responding to treatment. You may want to share this information with your care team at each visit. You should contact your care team if your seizures get worse or if you have any new types of seizures. Do not stop taking this medication or any of your seizure medications unless instructed by your care team. Stopping your medication suddenly can increase your seizures or their severity. This medication may cause serious skin  reactions. They can happen weeks to months after starting the medication. Contact your care team right away if you notice fevers or flu-like symptoms with a rash. The rash may be red or purple and then turn into blisters or  peeling of the skin. Or, you might notice a red rash with swelling of the face, lips or lymph nodes in your neck or under your arms. Wear a medical identification bracelet or chain if you are taking this medication for seizures. Carry a card that lists all your medications. This medication may affect your coordination, reaction time, or judgment. Do not drive or operate machinery until you know how this medication affects you. Sit up or stand slowly to reduce the risk of dizzy or fainting spells. Drinking alcohol with this medication can increase the risk of these side effects. Your mouth may get dry. Chewing sugarless gum or sucking hard candy, and drinking plenty of water may help. Watch for new or worsening thoughts of suicide or depression. This includes sudden changes in mood, behaviors, or thoughts. These changes can happen at any time but are more common in the beginning of treatment or after a change in dose. Call your care team right away if you experience these thoughts or worsening depression. If you become pregnant while using this medication, you may enroll in the [place] Pregnancy Registry by calling [phone]. This registry collects information about the safety of antiepileptic medication use during pregnancy. What side effects may I notice from receiving this medication? Side effects that you should report to your care team as soon as possible: Allergic reactions or angioedema--skin rash, itching, hives, swelling of the face, eyes, lips, tongue, arms, or legs, trouble swallowing or breathing Rash, fever, and swollen lymph nodes Thoughts of suicide or self harm, worsening mood, feelings of depression Trouble breathing Unusual changes in mood or behavior in children after use such as difficulty concentrating, hostility, or restlessness Side effects that usually do not require medical attention (report to your care team if they continue or are  bothersome): Dizziness Drowsiness Nausea Swelling of ankles, feet, or hands Vomiting This list may not describe all possible side effects. Call your doctor for medical advice about side effects. You may report side effects to FDA at 1-800-FDA-1088. Where should I keep my medication? Keep out of reach of children and pets. Store at room temperature between 15 and 30 degrees C (59 and 86 degrees F). Get rid of any unused medication after the expiration date. This medication may cause accidental overdose and death if taken by other adults, children, or pets. To get rid of medications that are no longer needed or have expired: Take the medication to a medication take-back program. Check with your pharmacy or law enforcement to find a location. If you cannot return the medication, check the label or package insert to see if the medication should be thrown out in the garbage or flushed down the toilet. If you are not sure, ask your care team. If it is safe to put it in the trash, empty the medication out of the container. Mix the medication with cat litter, dirt, coffee grounds, or other unwanted substance. Seal the mixture in a bag or container. Put it in the trash. NOTE: This sheet is a summary. It may not cover all possible information. If you have questions about this medicine, talk to your doctor, pharmacist, or health care provider.  2022 Elsevier/Gold Standard (2021-01-28 00:00:00)  ____________________________________________________________________________________________  Medication Rules  Purpose:  To inform patients, and their family members, of our rules and regulations.  Applies to: All patients receiving prescriptions (written or electronic).  Pharmacy of record: Pharmacy where electronic prescriptions will be sent. If written prescriptions are taken to a different pharmacy, please inform the nursing staff. The pharmacy listed in the electronic medical record should be the one where  you would like electronic prescriptions to be sent.  Electronic prescriptions: In compliance with the [place] (STOP) Act of 2017 (Session Lanny Cramp [phone]), effective August 11, 2018, all controlled substances must be electronically prescribed. Calling prescriptions to the pharmacy will cease to exist.  Prescription refills: Only during scheduled appointments. Applies to all prescriptions.  NOTE: The following applies primarily to controlled substances (Opioid* Pain Medications).   Type of encounter (visit): For patients receiving controlled substances, face-to-face visits are required. (Not an option or up to the patient.)  Patient's responsibilities: Pain Pills: Bring all pain pills to every appointment (except for procedure appointments). Pill Bottles: Bring pills in original pharmacy bottle. Always bring the newest bottle. Bring bottle, even if empty. Medication refills: You are responsible for knowing and keeping track of what medications you take and those you need refilled. The day before your appointment: write a list of all prescriptions that need to be refilled. The day of the appointment: give the list to the admitting nurse. Prescriptions will be written only during appointments. No prescriptions will be written on procedure days. If you forget a medication: it will not be "Called in", "Faxed", or "electronically sent". You will need to get another appointment to get these prescribed. No early refills. Do not call asking to have your prescription filled early. Prescription Accuracy: You are responsible for carefully inspecting your prescriptions before leaving our office. Have the discharge nurse carefully go over each prescription with you, before taking them home. Make sure that your name is accurately spelled, that your address is correct. Check the name and dose of your medication to make sure it is accurate. Check the number of pills, and  the written instructions to make sure they are clear and accurate. Make sure that you are given enough medication to last until your next medication refill appointment. Taking Medication: Take medication as prescribed. When it comes to controlled substances, taking less pills or less frequently than prescribed is permitted and encouraged. Never take more pills than instructed. Never take medication more frequently than prescribed.  Inform other Doctors: Always inform, all of your healthcare providers, of all the medications you take. Pain Medication from other Providers: You are not allowed to accept any additional pain medication from any other Doctor or Healthcare provider. There are two exceptions to this rule. (see below) In the event that you require additional pain medication, you are responsible for notifying us, as stated below. Cough Medicine: Often these contain an opioid, such as codeine or hydrocodone. Never accept or take cough medicine containing these opioids if you are already taking an opioid* medication. The combination may cause respiratory failure and death. Medication Agreement: You are responsible for carefully reading and following our Medication Agreement. This must be signed before receiving any prescriptions from our practice. Safely store a copy of your signed Agreement. Violations to the Agreement will result in no further prescriptions. (Additional copies of our Medication Agreement are available upon request.) Laws, Rules, & Regulations: All patients are expected to follow all Federal and [employer], [employer], Rules, [employer] [facility]. Ignorance of the Laws does not constitute a  valid excuse.  Illegal drugs and Controlled Substances: The use of illegal substances (including, but not limited to marijuana and its derivatives) and/or the illegal use of any controlled substances is strictly prohibited. Violation of this rule may result in the immediate and permanent discontinuation of  any and all prescriptions being written by our practice. The use of any illegal substances is prohibited. Adopted CDC guidelines & recommendations: Target dosing levels will be at or below 60 MME/day. Use of benzodiazepines** is not recommended.  Exceptions: There are only two exceptions to the rule of not receiving pain medications from other Healthcare Providers. Exception #1 (Emergencies): In the event of an emergency (i.e.: accident requiring emergency care), you are allowed to receive additional pain medication. However, you are responsible for: As soon as you are able, call our office [phone], at any time of the day or night, and leave a message stating your name, the date and nature of the emergency, and the name and dose of the medication prescribed. In the event that your call is answered by a member of our staff, make sure to document and save the date, time, and the name of the person that took your information.  Exception #2 (Planned Surgery): In the event that you are scheduled by another doctor or dentist to have any type of surgery or procedure, you are allowed (for a period no longer than 30 days), to receive additional pain medication, for the acute post-op pain. However, in this case, you are responsible for picking up a copy of our "Post-op Pain Management for Surgeons" handout, and giving it to your surgeon or dentist. This document is available at our office, and does not require an appointment to obtain it. Simply go to our office during business hours (Monday-Thursday from 8:00 AM to 4:00 PM) (Friday 8:00 AM to 12:00 Noon) or if you have a scheduled appointment with [place], prior to your surgery, and ask for it by name. In addition, you are responsible for: calling our office [phone], at any time of the day or night, and leaving a message stating your name, name of your surgeon, type of surgery, and date of procedure or surgery. Failure to comply with your responsibilities may  result in termination of therapy involving the controlled substances. Medication Agreement Violation. Following the above rules, including your responsibilities will help you in avoiding a Medication Agreement Violation (Breaking your Pain Medication Contract).  *Opioid medications include: morphine, codeine, oxycodone, oxymorphone, hydrocodone, hydromorphone, meperidine, tramadol, tapentadol, buprenorphine, fentanyl, methadone. **Benzodiazepine medications include: diazepam (Valium), alprazolam (Xanax), clonazepam (Klonopine), lorazepam (Ativan), clorazepate (Tranxene), chlordiazepoxide (Librium), estazolam (Prosom), oxazepam (Serax), temazepam (Restoril), triazolam (Halcion) (Last updated: 05/08/2021) ____________________________________________________________________________________________  ____________________________________________________________________________________________  Medication Recommendations and Reminders  Applies to: All patients receiving prescriptions (written and/or electronic).  Medication Rules & Regulations: These rules and regulations exist for your safety and that of others. They are not flexible and neither are we. Dismissing or ignoring them will be considered "non-compliance" with medication therapy, resulting in complete and irreversible termination of such therapy. (See document titled "Medication Rules" for more details.) In all conscience, because of safety reasons, we cannot continue providing a therapy where the patient does not follow instructions.  Pharmacy of record:  Definition: This is the pharmacy where your electronic prescriptions will be sent.  We do not endorse any particular pharmacy, however, we have experienced problems with [facility] not securing enough medication supply for the community. We do not restrict you in your choice of pharmacy. However,  once we write for your prescriptions, we will NOT be re-sending more prescriptions to fix  restricted supply problems created by your pharmacy, or your insurance.  The pharmacy listed in the electronic medical record should be the one where you want electronic prescriptions to be sent. If you choose to change pharmacy, simply notify our nursing staff.  Recommendations: Keep all of your pain medications in a safe place, under lock and key, even if you live alone. We will NOT replace lost, stolen, or damaged medication. After you fill your prescription, take 1 week's worth of pills and put them away in a safe place. You should keep a separate, properly labeled bottle for this purpose. The remainder should be kept in the original bottle. Use this as your primary supply, until it runs out. Once it's gone, then you know that you have 1 week's worth of medicine, and it is time to come in for a prescription refill. If you do this correctly, it is unlikely that you will ever run out of medicine. To make sure that the above recommendation works, it is very important that you make sure your medication refill appointments are scheduled at least 1 week before you run out of medicine. To do this in an effective manner, make sure that you do not leave the office without scheduling your next medication management appointment. Always ask the nursing staff to show you in your prescription , when your medication will be running out. Then arrange for the receptionist to get you a return appointment, at least 7 days before you run out of medicine. Do not wait until you have 1 or 2 pills left, to come in. This is very poor planning and does not take into consideration that we may need to cancel appointments due to bad weather, sickness, or emergencies affecting our staff. DO NOT ACCEPT A "Partial Fill": If for any reason your pharmacy does not have enough pills/tablets to completely fill or refill your prescription, do not allow for a "partial fill". The law allows the pharmacy to complete that prescription within 72  hours, without requiring a new prescription. If they do not fill the rest of your prescription within those 72 hours, you will need a separate prescription to fill the remaining amount, which we will NOT provide. If the reason for the partial fill is your insurance, you will need to talk to the pharmacist about payment alternatives for the remaining tablets, but again, DO NOT ACCEPT A PARTIAL FILL, unless you can trust your pharmacist to obtain the remainder of the pills within 72 hours.  Prescription refills and/or changes in medication(s):  Prescription refills, and/or changes in dose or medication, will be conducted only during scheduled medication management appointments. (Applies to both, written and electronic prescriptions.) No refills on procedure days. No medication will be changed or started on procedure days. No changes, adjustments, and/or refills will be conducted on a procedure day. Doing so will interfere with the diagnostic portion of the procedure. No phone refills. No medications will be "called into the pharmacy". No Fax refills. No weekend refills. No Holliday refills. No after hours refills.  Remember:  Business hours are:  Monday to Thursday 8:00 AM to 4:00 PM Provider's Schedule: Milinda Pointer, MD - Appointments are:  Medication management: Monday and Wednesday 8:00 AM to 4:00 PM Procedure day: Tuesday and Thursday 7:30 AM to 4:00 PM Gillis Santa, MD - Appointments are:  Medication management: Tuesday and Thursday 8:00 AM to 4:00 PM Procedure day:  Monday and Wednesday 7:30 AM to 4:00 PM (Last update: 02/29/2020) ____________________________________________________________________________________________  ____________________________________________________________________________________________  CBD (cannabidiol) & Delta-8 (Delta-8 tetrahydrocannabinol) WARNING  Intro: Cannabidiol (CBD) and tetrahydrocannabinol (THC), are two natural compounds found in plants of  the Cannabis genus. They can both be extracted from hemp or cannabis. Hemp and cannabis come from the Cannabis sativa plant. Both compounds interact with your bodys endocannabinoid system, but they have very different effects. CBD does not produce the high sensation associated with cannabis. Delta-8 tetrahydrocannabinol, also known as delta-8 THC, is a psychoactive substance found in the Cannabis sativa plant, of which marijuana and hemp are two varieties. THC is responsible for the high associated with the illicit use of marijuana.  Applicable to: All individuals currently taking or considering taking CBD (cannabidiol) and, more important, all patients taking opioid analgesic controlled substances (pain medication). (Example: oxycodone; oxymorphone; hydrocodone; hydromorphone; morphine; methadone; tramadol; tapentadol; fentanyl; buprenorphine; butorphanol; dextromethorphan; meperidine; codeine; etc.)  Legal status: CBD remains a Schedule I drug prohibited for any use. CBD is illegal with one exception. In the [place], CBD has a limited Transport planner (FDA) approval for the treatment of two specific types of epilepsy disorders. Only one CBD product has been approved by the FDA for this purpose: "Epidiolex". FDA is aware that some companies are marketing products containing cannabis and cannabis-derived compounds in ways that violate the [employer], Drug and Cosmetic Act [facility] Act) and that may put the health and safety of consumers at risk. The FDA, a Federal agency, has not enforced the CBD status since 2018.   Legality: Some manufacturers ship CBD products nationally, which is illegal. Often such products are sold online and are therefore available throughout the country. CBD is openly sold in head shops and health food stores in some states where such sales have not been explicitly legalized. Selling unapproved products with unsubstantiated therapeutic claims is not only a violation  of the law, but also can put patients at risk, as these products have not been proven to be safe or effective. Federal illegality makes it difficult to conduct research on CBD.  Reference: "FDA Regulation of Cannabis and Cannabis-Derived Products, Including Cannabidiol (CBD)" - [employer]  Warning: CBD is not FDA approved and has not undergo the same manufacturing controls as prescription drugs.  This means that the purity and safety of available CBD may be questionable. Most of the time, despite manufacturer's claims, it is contaminated with THC (delta-9-tetrahydrocannabinol - the chemical in marijuana responsible for the "HIGH").  When this is the case, the [facility] contaminant will trigger a positive urine drug screen (UDS) test for Marijuana (carboxy-THC). Because a positive UDS for any illicit substance is a violation of our medication agreement, your opioid analgesics (pain medicine) may be permanently discontinued. The FDA recently put out a warning about 5 things that everyone should be aware of regarding Delta-8 THC: Delta-8 THC products have not been evaluated or approved by the FDA for safe use and may be marketed in ways that put the public health at risk. The FDA has received adverse event reports involving delta-8 THC-containing products. Delta-8 THC has psychoactive and intoxicating effects. Delta-8 THC manufacturing often involve use of potentially harmful chemicals to create the concentrations of delta-8 THC claimed in the marketplace. The final delta-8 THC product may have potentially harmful by-products (contaminants) due to the chemicals used in the process. Manufacturing of delta-8 THC products may occur in uncontrolled or unsanitary settings, which may lead to the presence of unsafe  contaminants or other potentially harmful substances. Delta-8 THC products should be kept  out of the reach of children and pets.  MORE ABOUT CBD  General Information: CBD was discovered in 86 and it is a derivative of the cannabis sativa genus plants (Marijuana and Hemp). It is one of the 113 identified substances found in Marijuana. It accounts for up to 40% of the plant's extract. As of 2018, preliminary clinical studies on CBD included research for the treatment of anxiety, movement disorders, and pain. CBD is available and consumed in multiple forms, including inhalation of smoke or vapor, as an aerosol spray, and by mouth. It may be supplied as an oil containing CBD, capsules, dried cannabis, or as a liquid solution. CBD is thought not to be as psychoactive as THC (delta-9-tetrahydrocannabinol - the chemical in marijuana responsible for the "HIGH"). Studies suggest that CBD may interact with different biological target receptors in the body, including cannabinoid and other neurotransmitter receptors. As of 2018 the mechanism of action for its biological effects has not been determined.  Side-effects   Adverse reactions: Dry mouth, diarrhea, decreased appetite, fatigue, drowsiness, malaise, weakness, sleep disturbances, and others.  Drug interactions: CBC may interact with other medications such as blood-thinners. (Last update: 05/10/2021) ____________________________________________________________________________________________  ____________________________________________________________________________________________  Drug Holidays (Slow)  What is a "Drug Holiday"? Drug Holiday: is the name given to the period of time during which a patient stops taking a medication(s) for the purpose of eliminating tolerance to the drug.  Benefits Improved effectiveness of opioids. Decreased opioid dose needed to achieve benefits. Improved pain with lesser dose.  What is tolerance? Tolerance: is the progressive decreased in effectiveness of a drug due to its repetitive use. With  repetitive use, the body gets use to the medication and as a consequence, it loses its effectiveness. This is a common problem seen with opioid pain medications. As a result, a larger dose of the drug is needed to achieve the same effect that used to be obtained with a smaller dose.  How long should a "Drug Holiday" last? You should stay off of the pain medicine for at least 14 consecutive days. (2 weeks)  Should I stop the medicine "cold [place]"? No. You should always coordinate with your Pain Specialist so that he/she can provide you with the correct medication dose to make the transition as smoothly as possible.  How do I stop the medicine? Slowly. You will be instructed to decrease the daily amount of pills that you take by one (1) pill every seven (7) days. This is called a "slow downward taper" of your dose. For example: if you normally take four (4) pills per day, you will be asked to drop this dose to three (3) pills per day for seven (7) days, then to two (2) pills per day for seven (7) days, then to one (1) per day for seven (7) days, and at the end of those last seven (7) days, this is when the "Drug Holiday" would start.   Will I have withdrawals? By doing a "slow downward taper" like this one, it is unlikely that you will experience any significant withdrawal symptoms. Typically, what triggers withdrawals is the sudden stop of a high dose opioid therapy. Withdrawals can usually be avoided by slowly decreasing the dose over a prolonged period of time. If you do not follow these instructions and decide to stop your medication abruptly, withdrawals may be possible.  What are withdrawals? Withdrawals: refers to the wide range of symptoms  that occur after stopping or dramatically reducing opiate drugs after heavy and prolonged use. Withdrawal symptoms do not occur to patients that use low dose opioids, or those who take the medication sporadically. Contrary to benzodiazepine (example: Valium,  Xanax, etc.) or alcohol withdrawals (Delirium Tremens), opioid withdrawals are not lethal. Withdrawals are the physical manifestation of the body getting rid of the excess receptors.  Expected Symptoms Early symptoms of withdrawal may include: Agitation Anxiety Muscle aches Increased tearing Insomnia Runny nose Sweating Yawning  Late symptoms of withdrawal may include: Abdominal cramping Diarrhea Dilated pupils Goose bumps Nausea Vomiting  Will I experience withdrawals? Due to the slow nature of the taper, it is very unlikely that you will experience any.  What is a slow taper? Taper: refers to the gradual decrease in dose.  (Last update: 02/29/2020) ____________________________________________________________________________________________   Spinal Cord Stimulation Trial Information A spinal cord stimulation trial is a test to see whether a spinal cord stimulator reduces your pain. A spinal cord stimulator is a small device that is inserted (implanted) in your back. The stimulator has small wires (leads) that connect it to your spinal cord. The stimulator sends electrical pulses through the leads to the spinal cord. This can relieve pain. Settings for the stimulator can be adjusted with a remote device to find the best pain control. Your health care provider may suggest a spinal cord stimulation trial if other treatments for long-lasting (chronic) pain have not worked for you. Spinal cord stimulation may be used to manage pain that is caused by: Coronary artery disease or peripheral vascular disease. Failed back surgery. Phantom limb sensation. Peripheral neuropathy. Complex regional pain syndrome. Other syndromes that involve chronic pain. For the trial, the stimulator is attached to your back instead of inserted under the skin. A trial period is usually 3-5 days, but this can vary among health care providers. After your trial period, you and your health care provider will  discuss whether a permanent spinal cord stimulator is an option for you. The permanent stimulator may be an option depending on: Whether the stimulator reduces your pain during the trial. Whether the stimulator fits into your lifestyle. Whether the cost of the stimulator is covered by your insurance. What are the risks? Generally, a spinal cord stimulation trial is safe. However, problems can occur, including: Bleeding or pain at the insertion site of the leads. Infection at the insertion site or around the leads. Allergic reactions to medicines, devices, or dyes. Damage to the skin, nerves, back muscles, or spinal cord where the leads are placed. Inability to move the legs (paralysis). Numbness in the legs. Inability to control when you urinate or have a bowel movement (incontinence). Spinal fluid leakage. How is a spinal cord stimulator placed for a trial? For a trial period, the stimulator is placed on your skin, not under it. Only the leads that connect the stimulator to the spinal cord are implanted under your skin. The exact location of the stimulator depends on where you have pain. There are two types of surgery for implanting the leads: Noninvasive surgery. In this type of surgery, a small incision is made and needles are used to place the leads under your skin. Open surgery. In this type of surgery, a larger incision is made, and the leads are implanted directly into your back. How should I care for myself during the trial period? Activity Return to your normal activities as told by your health care provider. Ask your health care provider what activities are  safe for you. Do not lift anything that is heavier than 10 lb (4.5 kg), or the limit that you are told. General instructions Follow your health care provider's specific instructions about how to take care of your spinal cord stimulator and your incision. Make sure you write down the following information so that you can share  this information with your health care provider: Your responses to the stimulator. Describe these as told by your health care provider. Your pain level throughout the day. The amount and kind of pain medicine that you take. Take over-the-counter and prescription medicines only as told by your health care provider. Do not take baths, swim, or use a hot tub until your health care provider approves. Tell all health care providers who provide care for you that you have a spinal cord stimulator. This is important information that could affect the medical treatment that you receive. Keep all follow-up visits as told by your health care provider. This is important. When should I seek medical care? During the trial, seek medical care if: You have redness, swelling, or pain around your incision. You have fluid or blood coming from your incision. Your incision feels warm to the touch. You have pus or a bad smell coming from your incision. The bandage (dressing) that covers your incision comes off. Get help right away if: Your pain gets worse. The stimulator leads come out. You develop numbness or weakness in your legs, or you have difficulty walking. You have problems urinating or having a bowel movement. You have a fever. You have symptoms that last for more than 2-3 days. Your symptoms suddenly get worse. Summary A spinal cord stimulator is a small device that sends electrical pulses to your spinal cord. This can relieve pain caused by many different health conditions. Before a permanent stimulator is placed, you will have a trial using a temporary stimulator that is not implanted under your skin. This helps determine if a stimulator will reduce your pain. For the trial, only the leads that connect the stimulator to the spinal cord are implanted under your skin. During the trial period, make sure you write down information about your pain and your responses to the stimulator so that you can share  this information with your health care provider. Keep all follow-up visits as told by your health care provider. This is important. Contact your health care provider if you have symptoms that indicate a problem. This information is not intended to replace advice given to you by your health care provider. Make sure you discuss any questions you have with your health care provider. Document Revised: 01/31/2021 Document Reviewed: 09/01/2018 Elsevier Patient Education  [place].   Spinal Cord Stimulator Implantation A spinal cord stimulator is a small device that makes electrical signals and sends them through wires (leads) to nerves in the spinal cord. This stimulates the nerves and can help relieve long-term (chronic) pain in the back, legs, or arms. Implantation is a procedure to place this device under the skin. This procedure is done if a temporary spinal cord stimulator effectively reduces your pain during a trial period. A spinal cord stimulator may have an electrical pulse generator, a battery, and leads. It may also come with a small remote that you can control. Stimulators with a rechargeable battery may last up to 10 years. Stimulators with a non-rechargeable battery may last 2-5 years before being replaced. Tell a health care provider about: Any allergies you have. All medicines you are taking,  including vitamins, herbs, eye drops, creams, and over-the-counter medicines. Any problems you or family members have had with anesthetic medicines. Any blood disorders you have. Any surgeries you have had. Any medical conditions you have. Whether you are pregnant or may be pregnant. What are the risks? Generally, this is a safe procedure. However, problems may occur, including: Infection. Bleeding. Allergic failing or not working. A lead moving out of place. Inability to move (paralysis). A pocket of clear fluid forming under the skin (seroma). Spinal fluid  leakage. Numbness. Inability to control when you urinate or have a bowel movement (incontinence). What happens before the procedure? Staying hydrated Follow instructions from your health care provider about hydration, which may include: Up to 2 hours before the procedure - you may continue to drink clear liquids, such as water, clear fruit juice, black coffee, and plain tea. Eating and drinking restrictions Follow instructions from your health care provider about eating and drinking, which may include: 8 hours before the procedure - stop eating heavy meals or foods such as meat, fried foods, or fatty foods. 6 hours before the procedure - stop eating light meals or foods, such as toast or cereal. 6 hours before the procedure - stop drinking milk or drinks that contain milk. 2 hours before the procedure - stop drinking clear liquids. Medicines Ask your health care provider about: Changing or stopping your regular medicines. This is especially important if you are taking diabetes medicines or blood thinners. Taking medicines such as aspirin and ibuprofen. These medicines can thin your blood. Do not take these medicines unless your health care provider tells you to take them. Taking over-the-counter medicines, vitamins, herbs, and supplements. General instructions You may be asked to shower with a germ-killing soap. Do not use any products that contain nicotine or tobacco, such as cigarettes and e-cigarettes. If you need help quitting, ask your health care provider. You will have blood tests and physical exams. You may have chest X-rays. You will have an electrocardiogram (ECG) to check the electrical patterns and rhythms of your heart. Plan to have someone take you home from the hospital or clinic. Plan to have a responsible adult care for you for at least 24 hours after you leave the hospital or clinic. This is important. Ask your health care provider what steps will be taken to help prevent  infection. These may include: Removing hair at the surgery site. Washing skin with a germ-killing soap. Antibiotic medicine. What happens during the procedure? An IV will be inserted into one of your veins. You will be given one or more of the following: A medicine to help you relax (sedative). A medicine to numb the area (local anesthetic). A medicine to make you fall asleep (general anesthetic). Dye may be injected into your spinal canal (epidural space) to make it easier to see on X-rays. X-rays may be done during the procedure to help implant the stimulator. A small incision will be made in your back. A small piece of bone may be removed from your spine to make room for the device. Leads will be placed near your spinal cord. X-rays may be done to make sure they are in the right place. The leads will be tested with stimulation, and you will be asked to react to the tests. If you are under general anesthetic, you will be woken up for this part, and then given more medicine to make you fall asleep again. The leads will all be joined to one lead that connects  to the pulse generator (lead wire). The lead wire will be placed under your skin so that it leads from your spine to your abdomen or buttocks. A small incision will be made in your abdomen or buttocks. The pulse generator will be placed in either your abdomen or buttocks and connected to the lead wire. A small pocket of skin will be formed around it. Your incisions will be closed with stitches (sutures). Your incisions will be covered with bandages (dressings). The procedure may vary among health care providers and hospitals. What happens after the procedure? Your blood pressure, heart rate, breathing rate, and blood oxygen level will be monitored until you leave the hospital or clinic. You will be given pain medicine as needed. You will have to drink fluids. Your pulse generator may be programmed. Do not drive until your health care  provider approves. You may have to stay in the hospital after the procedure. Ask your health care provider how long you will stay. Summary A spinal cord stimulator sends electrical pulses through the leads to the spinal cord. This can relieve pain. A spinal cord stimulator may have an electrical pulse generator, a battery, and a small remote that you can control. Plan to have a responsible adult care for you for at least 24 hours after you leave the hospital or clinic. This information is not intended to replace advice given to you by your health care provider. Make sure you discuss any questions you have with your health care provider. Document Revised: 01/31/2021 Document Reviewed: 09/10/2017 Elsevier Patient Education  2022 [employer].

## 2021-07-24 NOTE — Progress Notes (Signed)
Nursing Pain Medication Assessment:  Safety precautions to be maintained throughout the outpatient stay will include: orient to surroundings, keep bed in low position, maintain call bell within reach at all times, provide assistance with transfer out of bed and ambulation.  Medication Inspection Compliance: Pill count conducted under aseptic conditions, in front of the patient. Neither the pills nor the bottle was removed from the patient's sight at any time. Once count was completed pills were immediately returned to the patient in their original bottle.  Medication: Tramadol (Ultram) Pill/Patch Count:  25 of 60 pills remain Pill/Patch Appearance: Markings consistent with prescribed medication Bottle Appearance: Standard pharmacy container. Clearly labeled. Filled Date: 36 / 17 / 2022 Last Medication intake:  Today Safety precautions to be maintained throughout the outpatient stay will include: orient to surroundings, keep bed in low position, maintain call bell within reach at all times, provide assistance with transfer out of bed and ambulation.

## 2021-08-22 ENCOUNTER — Ambulatory Visit (INDEPENDENT_AMBULATORY_CARE_PROVIDER_SITE_OTHER): Payer: Medicare HMO

## 2021-08-22 ENCOUNTER — Other Ambulatory Visit: Payer: Self-pay

## 2021-08-22 DIAGNOSIS — E538 Deficiency of other specified B group vitamins: Secondary | ICD-10-CM | POA: Diagnosis not present

## 2021-08-22 MED ORDER — CYANOCOBALAMIN 1000 MCG/ML IJ SOLN
1000.0000 ug | Freq: Once | INTRAMUSCULAR | Status: AC
  Administered 2021-08-22: 1000 ug via INTRAMUSCULAR

## 2021-08-22 NOTE — Progress Notes (Signed)
Patient presented for B 12 injection to right deltoid, patient voiced no concerns nor showed any signs of distress during injection. 

## 2021-09-02 DIAGNOSIS — M7542 Impingement syndrome of left shoulder: Secondary | ICD-10-CM | POA: Diagnosis not present

## 2021-09-05 DIAGNOSIS — H401133 Primary open-angle glaucoma, bilateral, severe stage: Secondary | ICD-10-CM | POA: Diagnosis not present

## 2021-09-20 DIAGNOSIS — L932 Other local lupus erythematosus: Secondary | ICD-10-CM | POA: Diagnosis not present

## 2021-09-24 ENCOUNTER — Other Ambulatory Visit (INDEPENDENT_AMBULATORY_CARE_PROVIDER_SITE_OTHER): Payer: Medicare HMO

## 2021-09-24 ENCOUNTER — Other Ambulatory Visit: Payer: Self-pay

## 2021-09-24 ENCOUNTER — Ambulatory Visit (INDEPENDENT_AMBULATORY_CARE_PROVIDER_SITE_OTHER): Payer: Medicare HMO

## 2021-09-24 DIAGNOSIS — E538 Deficiency of other specified B group vitamins: Secondary | ICD-10-CM | POA: Diagnosis not present

## 2021-09-24 DIAGNOSIS — E7800 Pure hypercholesterolemia, unspecified: Secondary | ICD-10-CM | POA: Diagnosis not present

## 2021-09-24 DIAGNOSIS — I10 Essential (primary) hypertension: Secondary | ICD-10-CM | POA: Diagnosis not present

## 2021-09-24 LAB — CBC WITH DIFFERENTIAL/PLATELET
Basophils Absolute: 0 10*3/uL (ref 0.0–0.1)
Basophils Relative: 0.5 % (ref 0.0–3.0)
Eosinophils Absolute: 0.1 10*3/uL (ref 0.0–0.7)
Eosinophils Relative: 1.7 % (ref 0.0–5.0)
HCT: 37.9 % (ref 36.0–46.0)
Hemoglobin: 12.5 g/dL (ref 12.0–15.0)
Lymphocytes Relative: 28.3 % (ref 12.0–46.0)
Lymphs Abs: 1.9 10*3/uL (ref 0.7–4.0)
MCHC: 32.9 g/dL (ref 30.0–36.0)
MCV: 96.7 fl (ref 78.0–100.0)
Monocytes Absolute: 0.6 10*3/uL (ref 0.1–1.0)
Monocytes Relative: 9.3 % (ref 3.0–12.0)
Neutro Abs: 4 10*3/uL (ref 1.4–7.7)
Neutrophils Relative %: 60.2 % (ref 43.0–77.0)
Platelets: 165 10*3/uL (ref 150.0–400.0)
RBC: 3.93 Mil/uL (ref 3.87–5.11)
RDW: 13.8 % (ref 11.5–15.5)
WBC: 6.7 10*3/uL (ref 4.0–10.5)

## 2021-09-24 LAB — TSH: TSH: 3.56 u[IU]/mL (ref 0.35–5.50)

## 2021-09-24 MED ORDER — CYANOCOBALAMIN 1000 MCG/ML IJ SOLN
1000.0000 ug | Freq: Once | INTRAMUSCULAR | Status: AC
  Administered 2021-09-24: 1000 ug via INTRAMUSCULAR

## 2021-09-24 NOTE — Progress Notes (Signed)
Patient came in today for B-12 injection given in right deltoid IM. Patient tolerated well with no signs of distress.

## 2021-09-25 ENCOUNTER — Telehealth: Payer: Self-pay | Admitting: Pharmacist

## 2021-09-25 LAB — LIPID PANEL
Cholesterol: 164 mg/dL (ref 0–200)
HDL: 69.2 mg/dL (ref >39.00)
LDL Cholesterol: 70 mg/dL (ref 0–99)
NonHDL: 94.79
Total CHOL/HDL Ratio: 2
Triglycerides: 122 mg/dL (ref 0.0–149.0)
VLDL: 24.4 mg/dL (ref 0.0–40.0)

## 2021-09-25 LAB — BASIC METABOLIC PANEL
BUN: 14 mg/dL (ref 6–23)
CO2: 28 mEq/L (ref 19–32)
Calcium: 9.4 mg/dL (ref 8.4–10.5)
Chloride: 107 mEq/L (ref 96–112)
Creatinine, Ser: 0.83 mg/dL (ref 0.40–1.20)
GFR: 68.75 mL/min (ref >60.00)
Glucose, Bld: 87 mg/dL (ref 70–99)
Potassium: 4 mEq/L (ref 3.5–5.1)
Sodium: 142 mEq/L (ref 135–145)

## 2021-09-25 LAB — HEPATIC FUNCTION PANEL
ALT: 11 U/L (ref 0–35)
AST: 21 U/L (ref 0–37)
Albumin: 4.3 g/dL (ref 3.5–5.2)
Alkaline Phosphatase: 66 U/L (ref 39–117)
Bilirubin, Direct: 0.1 mg/dL (ref 0.0–0.3)
Total Bilirubin: 0.3 mg/dL (ref 0.2–1.2)
Total Protein: 6.5 g/dL (ref 6.0–8.3)

## 2021-09-25 NOTE — Telephone Encounter (Signed)
Please make note on schedule - to document blood pressure (on schedule)

## 2021-09-25 NOTE — Telephone Encounter (Signed)
Patient appearing on report for [place] Metric Hypertension Control due to last documented blood pressure of 150/79 (heart rate 55). Next appointment with PCP is 10/03/21 (next Thursday)  Please consider referral to Pharmacy for medication management.

## 2021-09-26 NOTE — Telephone Encounter (Signed)
Documented on schedule

## 2021-10-03 ENCOUNTER — Ambulatory Visit (INDEPENDENT_AMBULATORY_CARE_PROVIDER_SITE_OTHER): Payer: Medicare HMO | Admitting: Internal Medicine

## 2021-10-03 ENCOUNTER — Other Ambulatory Visit: Payer: Self-pay

## 2021-10-03 DIAGNOSIS — M25512 Pain in left shoulder: Secondary | ICD-10-CM

## 2021-10-03 DIAGNOSIS — M1711 Unilateral primary osteoarthritis, right knee: Secondary | ICD-10-CM | POA: Diagnosis not present

## 2021-10-03 DIAGNOSIS — I10 Essential (primary) hypertension: Secondary | ICD-10-CM

## 2021-10-03 DIAGNOSIS — E7800 Pure hypercholesterolemia, unspecified: Secondary | ICD-10-CM | POA: Diagnosis not present

## 2021-10-03 DIAGNOSIS — E538 Deficiency of other specified B group vitamins: Secondary | ICD-10-CM

## 2021-10-03 DIAGNOSIS — M25511 Pain in right shoulder: Secondary | ICD-10-CM

## 2021-10-03 DIAGNOSIS — K5010 Crohn's disease of large intestine without complications: Secondary | ICD-10-CM | POA: Diagnosis not present

## 2021-10-03 NOTE — Progress Notes (Signed)
Patient ID: Ellen Drake, female   DOB: 02-Aug-1946, 76 y.o.   MRN: [id number]   Subjective:    Patient ID: Ellen Drake, female    DOB: [birth year], 76 y.o.   MRN: [id number]  This visit occurred during the SARS-CoV-2 public health emergency.  Safety protocols were in place, including screening questions prior to the visit, additional usage of staff PPE, and extensive cleaning of exam room while observing appropriate contact time as indicated for disinfecting solutions.   Patient here for a scheduled follow up.   Chief Complaint  Patient presents with   Hypertension   Hyperlipidemia   Shoulder Pain   .   HPI Being followed by pain clinic for chronic knee pain.  Knee not as bad.  Tries to stay active.  No chest pain or sob reported.  Seeing ortho for left shoulder pain - s/p injection.  Being followed by Gi for Crohn's - on sulfasalazine.  Due colonoscopy 05/2022.  No cough or congestion.  No acid reflux reported.  No abdominal pain.  Bowels stable.    Past Medical History:  Diagnosis Date   Anemia    reslved   Colon polyp 2009   Crohn's disease ([place])    Diverticulosis    Fibrocystic breast disease    Herniated nucleus pulposus of lumbosacral region    History of chicken pox    Hypercholesterolemia    Hyperlipidemia    Hypertension    Osteoarthritis    Pulmonary nodule seen on imaging study 2004   resolved 2007   Past Surgical History:  Procedure Laterality Date   anterior cervical discectomy and fusion  1998   Dr Cherene Altes SURGERY  1992   ruptured disc (Dr Mauri Pole)   BREAST BIOPSY Left    neg-no scar seen   CATARACT EXTRACTION W/PHACO Right 02/09/2019   Procedure: CATARACT EXTRACTION PHACO AND INTRAOCULAR LENS PLACEMENT ([place]) [place]  RIGHT HEALON 5, VISION BLUE;  Surgeon: Leandrew Koyanagi, MD;  Location: [facility];  Service: Ophthalmology;  Laterality: Right;   COLECTOMY  2003   [place]   COLONOSCOPY  2009    COLONOSCOPY W/ BIOPSIES  11/30/2012   Procedure: COLONOSCOPY W/BIOPSY; Surgeon: Colvin Caroli, MD; Location: [facility]; Service: Gastroenterology;;   COLONOSCOPY W/ BIOPSIES  04/15/2016   Procedure: Colonoscopy ; Surgeon: Colvin Caroli, MD; Location: [facility]; Service: Gastroenterology; Laterality: N/A;   KNEE ARTHROSCOPY W/ SYNOVECTOMY Right 03/10/2014   limited   KNEE SURGERY Right 1/16   [facility] (Dr. Sherlynn Carbon)   [place] / DECOMPRESSION LUMBAR SPINE  1999   REPLACEMENT TOTAL KNEE     ROTATOR CUFF REPAIR Left 2007   acute open; accident   Family History  Problem Relation Age of Onset   CVA Mother    Hypertension Mother    Hypertension Sister        brain tumor   Breast cancer Neg Hx    Colon cancer Neg Hx    Social History   Socioeconomic History   Marital status: Married    Spouse name: Not on file   Number of children: 0   Years of education: 12   Highest education level: Not on file  Occupational History   Not on file  Tobacco Use   Smoking status: Former    Types: Cigarettes    Quit date: 09/10/2009    Years since quitting: 12.0   Smokeless tobacco: Never  Vaping Use   Vaping Use: Never used  Substance and Sexual Activity   Alcohol use: Yes    Alcohol/week: 1.0 standard drink    Types: 1 Glasses of wine per week    Comment: wine occasional; socially   Drug use: No   Sexual activity: Yes  Other Topics Concern   Not on file  Social History Narrative   Not on file   Social Determinants of Health   Financial Resource Strain: Low Risk    Difficulty of Paying Living Expenses: Not hard at all  Food Insecurity: No Food Insecurity   Worried About Charity fundraiser in the Last Year: Never true   [place] in the Last Year: Never true  Transportation Needs: No Transportation Needs   Lack of Transportation (Medical): No   Lack of Transportation (Non-Medical): No  Physical Activity: Unknown   Days of  Exercise per Week: 0 days   Minutes of Exercise per Session: Not on file  Stress: No Stress Concern Present   Feeling of Stress : Not at all  Social Connections: Unknown   Frequency of Communication with Friends and Family: More than three times a week   Frequency of Social Gatherings with Friends and Family: More than three times a week   Attends Religious Services: Not on Electrical engineer or Organizations: Yes   Attends Archivist Meetings: More than 4 times per year   Marital Status: Married     Review of Systems  Constitutional:  Negative for appetite change and unexpected weight change.  HENT:  Negative for congestion and sinus pressure.   Respiratory:  Negative for cough, chest tightness and shortness of breath.   Cardiovascular:  Negative for chest pain, palpitations and leg swelling.  Gastrointestinal:  Negative for abdominal pain, diarrhea, nausea and vomiting.  Genitourinary:  Negative for difficulty urinating and dysuria.  Musculoskeletal:  Negative for joint swelling and myalgias.  Skin:  Negative for color change and rash.  Neurological:  Negative for dizziness, light-headedness and headaches.  Psychiatric/Behavioral:  Negative for agitation and dysphoric mood.       Objective:     BP 134/72    Pulse 66    Temp 98 F (36.7 C)    Resp 16    Ht 5' 6"  (1.676 m)    Wt 149 lb (67.6 kg)    LMP 08/13/1995    SpO2 98%    BMI 24.05 kg/m  Wt Readings from Last 3 Encounters:  10/03/21 149 lb (67.6 kg)  07/24/21 140 lb (63.5 kg)  07/17/21 140 lb (63.5 kg)    Physical Exam Vitals reviewed.  Constitutional:      General: She is not in acute distress.    Appearance: Normal appearance.  HENT:     Head: Normocephalic and atraumatic.     Right Ear: External ear normal.     Left Ear: External ear normal.  Eyes:     General: No scleral icterus.       Right eye: No discharge.        Left eye: No discharge.     Conjunctiva/sclera: Conjunctivae normal.   Neck:     Thyroid: No thyromegaly.  Cardiovascular:     Rate and Rhythm: Normal rate and regular rhythm.  Pulmonary:     Effort: No respiratory distress.     Breath sounds: Normal breath sounds. No wheezing.  Abdominal:     General: Bowel sounds are  normal.     Palpations: Abdomen is soft.     Tenderness: There is no abdominal tenderness.  Musculoskeletal:        General: No swelling or tenderness.     Cervical back: Neck supple. No tenderness.  Lymphadenopathy:     Cervical: No cervical adenopathy.  Skin:    Findings: No erythema or rash.  Neurological:     Mental Status: She is alert.  Psychiatric:        Mood and Affect: Mood normal.        Behavior: Behavior normal.     Outpatient Encounter Medications as of 10/03/2021  Medication Sig   amLODipine (NORVASC) 10 MG tablet TAKE 1 TABLET EVERY DAY   ASPIRIN 81 PO Take by mouth daily.   atenolol (TENORMIN) 50 MG tablet TAKE 1 TABLET EVERY DAY   brimonidine (ALPHAGAN) 0.2 % ophthalmic solution 1 drop 2 (two) times daily.    clotrimazole-betamethasone (LOTRISONE) cream APPLY TO AFFECTED AREA TWICE A DAY   gabapentin (NEURONTIN) 100 MG capsule Take 2 capsules (200 mg total) by mouth at bedtime.   latanoprost (XALATAN) 0.005 % ophthalmic solution Place 1 drop into both eyes at bedtime.    lisinopril (ZESTRIL) 40 MG tablet TAKE 1 TABLET EVERY DAY   nystatin cream (MYCOSTATIN) Apply 1 application topically 2 (two) times daily.   ondansetron (ZOFRAN) 4 MG tablet Take 1 tablet (4 mg total) by mouth every 12 (twelve) hours as needed for nausea or vomiting.   rosuvastatin (CRESTOR) 10 MG tablet TAKE 1 TABLET EVERY DAY   sulfaSALAzine (AZULFIDINE) 500 MG tablet Take 1,500 mg by mouth 2 (two) times daily. 3 tabs   timolol (TIMOPTIC) 0.5 % ophthalmic solution Place 1 drop into both eyes 2 (two) times daily.   traMADol (ULTRAM) 50 MG tablet Take 1 tablet (50 mg total) by mouth 2 (two) times daily.   No facility-administered encounter  medications on file as of 10/03/2021.     Lab Results  Component Value Date   WBC 6.7 09/24/2021   HGB 12.5 09/24/2021   HCT 37.9 09/24/2021   PLT 165.0 09/24/2021   GLUCOSE 87 09/24/2021   CHOL 164 09/24/2021   TRIG 122.0 09/24/2021   HDL 69.20 09/24/2021   LDLDIRECT 132.5 05/06/2013   LDLCALC 70 09/24/2021   ALT 11 09/24/2021   AST 21 09/24/2021   NA 142 09/24/2021   K 4.0 09/24/2021   CL 107 09/24/2021   CREATININE 0.83 09/24/2021   BUN 14 09/24/2021   CO2 28 09/24/2021   TSH 3.56 09/24/2021   HGBA1C 5.7 10/19/2012    DG PAIN CLINIC C-ARM 1-60 MIN NO REPORT  Result Date: 03/07/2021 Fluoro was used, but no Radiologist interpretation will be provided. Please refer to "NOTES" tab for provider progress note.      Assessment & Plan:   Problem List Items Addressed This Visit     Crohn's disease ([place])    Stable.  Continue sulfasalazine.  Being followed by GI ([facility]).       Hypercholesterolemia    Continue crestor.  Follow lipid panel and liver function tests.       Hypertension    Continue lisinopril, amlodipine and atenolol.   Follow pressures.  Follow metabolic panel.  Have asked her to spot check her pressures and send in readings.       Shoulder pain, bilateral    Has had shoulder pain in both shoulders.  Has seen ortho.  Impingement of left shoulder s/p injection.  Shoulders are doing better.  Follow       Vitamin B12 deficiency    Continue B12 injections.       Osteoarthritis of knee (Right) (Chronic)    Has had several surgeries on her knee.  Now being followed by pain clinic.  Pain is some better.  Follow.         Einar Pheasant, MD

## 2021-10-06 ENCOUNTER — Encounter: Payer: Self-pay | Admitting: Internal Medicine

## 2021-10-07 ENCOUNTER — Encounter: Payer: Self-pay | Admitting: Internal Medicine

## 2021-10-07 NOTE — Assessment & Plan Note (Signed)
Continue B12 injections.

## 2021-10-07 NOTE — Assessment & Plan Note (Signed)
Continue crestor.  Follow lipid panel and liver function tests.

## 2021-10-07 NOTE — Assessment & Plan Note (Signed)
Has had shoulder pain in both shoulders.  Has seen ortho.  Impingement of left shoulder s/p injection.  Shoulders are doing better.  Follow

## 2021-10-07 NOTE — Assessment & Plan Note (Addendum)
Continue lisinopril, amlodipine and atenolol.   Follow pressures.  Follow metabolic panel.  Have asked her to spot check her pressures and send in readings.

## 2021-10-07 NOTE — Assessment & Plan Note (Signed)
Stable.  Continue sulfasalazine.  Being followed by GI ([facility]).

## 2021-10-07 NOTE — Assessment & Plan Note (Signed)
Has had several surgeries on her knee.  Now being followed by pain clinic.  Pain is some better.  Follow.

## 2021-10-22 ENCOUNTER — Ambulatory Visit (INDEPENDENT_AMBULATORY_CARE_PROVIDER_SITE_OTHER): Payer: Medicare HMO | Admitting: *Deleted

## 2021-10-22 ENCOUNTER — Other Ambulatory Visit: Payer: Self-pay

## 2021-10-22 DIAGNOSIS — E538 Deficiency of other specified B group vitamins: Secondary | ICD-10-CM | POA: Diagnosis not present

## 2021-10-22 MED ORDER — CYANOCOBALAMIN 1000 MCG/ML IJ SOLN
1000.0000 ug | Freq: Once | INTRAMUSCULAR | Status: AC
  Administered 2021-10-22: 1000 ug via INTRAMUSCULAR

## 2021-10-22 NOTE — Progress Notes (Signed)
Pt arrived for B12 injection, given in L deltoid. Pt tolerated injection well, showed no signs of distress nor voiced any concerns.  ?

## 2021-10-24 ENCOUNTER — Other Ambulatory Visit: Payer: Self-pay | Admitting: Internal Medicine

## 2021-11-11 NOTE — Progress Notes (Signed)
PROVIDER NOTE: Information contained herein reflects review and annotations entered in association with encounter. Interpretation of such information and data should be left to medically-trained personnel. Information provided to patient can be located elsewhere in the medical record under "Patient Instructions". Document created using STT-dictation technology, any transcriptional errors that may result from process are unintentional.  ?  ?Patient: Ellen Drake  Service Category: E/M  Provider: Gaspar Cola, MD  ?DOB: 1946/01/28  DOS: 11/13/2021  Specialty: Interventional Pain Management  ?MRN: [id number]  Setting: Ambulatory outpatient  PCP: Einar Pheasant, MD  ?Type: Established Patient    Referring Provider: Einar Pheasant, MD  ?Location: Office  Delivery: Face-to-face    ? ?HPI  ?[name], a 76 y.o. year old female, is here today because of her Chronic pain syndrome [G89.4]. [name] primary complain today is Knee Pain (right) ?Last encounter: My last encounter with her was on 07/24/2021. ?Pertinent problems: [name] has Arthritis; Chronic knee pain (1ry area of Pain) (Right); S/P revision of total knee (Right); Osteoarthritis of knee (Right); Right leg swelling; Shoulder pain, bilateral; Disorder of the skin and subcutaneous tissue, unspecified; Synovitis of knee; History of unicompartmental knee replacement (Right); Chronic pain syndrome; Chronic knee pain s/p TKR (Right); Pain and swelling of knee (Right); and Neurogenic pain on their pertinent problem list. ?Pain Assessment: Severity of Chronic pain is reported as a 4 /10. Location: Knee Right/Denies. Onset: More than a month ago. Quality: Aching, Burning, Constant. Timing: Constant. Modifying factor(s): nothing. ?Vitals:  height is 5' 6"  (1.676 m) and weight is 149 lb (67.6 kg). Her temperature is 97 ?F (36.1 ?C) (abnormal). Her blood pressure is 151/63 (abnormal) and her pulse is 55 (abnormal). Her oxygen saturation is 99%.   ? ?Reason for encounter: medication management.   The patient indicates doing well with the current medication regimen. No adverse reactions or side effects reported to the medications.  ? ?The patient indicates still having some swelling and discomfort around the right knee.  This is not as bad as it was before the radiofrequency.  I have informed the patient that when the benefits from the radiofrequency ablation of the genicular nerves were soft, she can let us know and we can again repeated.  She seems to be doing okay with the medication but she continues to be concerned about the swelling.  The last right knee MRI was done around July 2022 and it indicated very little joint effusion but it also indicated edema in the muscles surrounding the knee.  The MRI suggest that it could be some muscle straining.  The patient indicates having had a total of 3 knee replacement on that right knee since 2018 and she continues to have pain and swelling.  She also indicates that she has had the knee aspirated twice with no fluid being drawn.  This confirms that there may be some edema around the knee, but the swelling is not necessarily secondary to fluid in the joint. ? ?Today I have reminded the patient that is for the purpose of long-term management of this pain, I can offer her repeat genicular nerve radiofrequency ablations as well as a spinal cord stimulator trial with possible implant.  However, the patient has pointed out that she looked into it and she is not interested in having a spinal cord stimulator implant.  For this reason, we will simply continue with the medication management for the time being.  The patient denies any low back pain or any  symptoms that may be considered to be radicular. ? ?RTCB: 05/18/2022 ?Nonopioids transferred 05/08/2021: Gabapentin ? ?Pharmacotherapy Assessment  ?Analgesic: Tramadol 50 mg 1 tab p.o. twice daily (100 mg/day of tramadol) (10 MME) ?MME/day: 10 mg/day  ? ?Monitoring: ?[place]  PMP: PDMP reviewed during this encounter.       ?Pharmacotherapy: No side-effects or adverse reactions reported. ?Compliance: No problems identified. ?Effectiveness: Clinically acceptable. ? ?Chauncey Fischer, RN  11/13/2021  9:38 AM  Sign when Signing Visit ?Nursing Pain Medication Assessment:  ?Safety precautions to be maintained throughout the outpatient stay will include: orient to surroundings, keep bed in low position, maintain call bell within reach at all times, provide assistance with transfer out of bed and ambulation.  ?Medication Inspection Compliance: Pill count conducted under aseptic conditions, in front of the patient. Neither the pills nor the bottle was removed from the patient's sight at any time. Once count was completed pills were immediately returned to the patient in their original bottle. ? ?Medication: Tramadol (Ultram) ?Pill/Patch Count:  23 of 60 pills remain ?Pill/Patch Appearance: Markings consistent with prescribed medication ?Bottle Appearance: Standard pharmacy container. Clearly labeled. ?Filled Date: 3 / 10 / 2023 ?Last Medication intake:  TodaySafety precautions to be maintained throughout the outpatient stay will include: orient to surroundings, keep bed in low position, maintain call bell within reach at all times, provide assistance with transfer out of bed and ambulation.  ?   UDS:  ?Summary  ?Date Value Ref Range Status  ?02/20/2021 Note  Final  ?  Comment:  ?  ==================================================================== ?[employer]ox[employer] Select 13 (MW) ?==================================================================== ?Test                             Result       Flag       Units ? ?Drug Present and Declared for Prescription Verification ?  Tramadol                       >3968        EXPECTED   ng/mg creat ?  O-Desmethyltramadol            >3968        EXPECTED   ng/mg creat ?  N-Desmethyltramadol            1874         EXPECTED   ng/mg creat ?   Source of tramadol is a  prescription medication. O-desmethyltramadol ?   and N-desmethyltramadol are expected metabolites of tramadol. ? ?==================================================================== ?Test                      Result    Flag   Units      Ref Range ?  Creatinine              126              mg/dL      >=20 ?==================================================================== ?Declared Medications: ? The flagging and interpretation on this report are based on the ? following declared medications.  Unexpected results may arise from ? inaccuracies in the declared medications. ? ? **Note: The testing scope of this panel includes these medications: ? ? Tramadol (Ultram) ? ? **Note: The testing scope of this panel does not include the ? following reported medications: ? ? Amlodipine ? Aspirin ? Atenolol ? Betamethasone (Lotrisone) ? Clotrimazole (Lotrisone) ? Eye Drop ? Gabapentin ? Lisinopril ?  Nystatin ? Rosuvastatin ? Sulfasalazine ?==================================================================== ?For clinical consultation, please call [phone]. ?==================================================================== ?  ?  ? ?ROS  ?Constitutional: Denies any fever or chills ?Gastrointestinal: No reported hemesis, hematochezia, vomiting, or acute GI distress ?Musculoskeletal: Denies any acute onset joint swelling, redness, loss of ROM, or weakness ?Neurological: No reported episodes of acute onset apraxia, aphasia, dysarthria, agnosia, amnesia, paralysis, loss of coordination, or loss of consciousness ? ?Medication Review  ?Aspirin, amLODipine, atenolol, brimonidine, clotrimazole-betamethasone, gabapentin, latanoprost, lisinopril, nystatin cream, ondansetron, rosuvastatin, sulfaSALAzine, timolol, and traMADol ? ?History Review  ?Allergy: [name] is allergic to mercaptopurine, lyrica [pregabalin], and dilaudid [hydromorphone hcl]. ?Drug: [name]  reports no history of drug use. ?Alcohol:  reports current  alcohol use of about 1.0 standard drink per week. ?Tobacco:  reports that she quit smoking about 12 years ago. Her smoking use included cigarettes. She has never used smokeless tobacco. ?Social: [name]  rep

## 2021-11-12 NOTE — Patient Instructions (Signed)
____________________________________________________________________________________________ ? ?Medication Rules ? ?Purpose: To inform patients, and their family members, of our rules and regulations. ? ?Applies to: All patients receiving prescriptions (written or electronic). ? ?Pharmacy of record: Pharmacy where electronic prescriptions will be sent. If written prescriptions are taken to a different pharmacy, please inform the nursing staff. The pharmacy listed in the electronic medical record should be the one where you would like electronic prescriptions to be sent. ? ?Electronic prescriptions: In compliance with the [place] (STOP) Act of 2017 (Session Lanny Cramp [phone]), effective August 11, 2018, all controlled substances must be electronically prescribed. Calling prescriptions to the pharmacy will cease to exist. ? ?Prescription refills: Only during scheduled appointments. Applies to all prescriptions. ? ?NOTE: The following applies primarily to controlled substances (Opioid* Pain Medications).  ? ?Type of encounter (visit): For patients receiving controlled substances, face-to-face visits are required. (Not an option or up to the patient.) ? ?Patient's responsibilities: ?Pain Pills: Bring all pain pills to every appointment (except for procedure appointments). ?Pill Bottles: Bring pills in original pharmacy bottle. Always bring the newest bottle. Bring bottle, even if empty. ?Medication refills: You are responsible for knowing and keeping track of what medications you take and those you need refilled. ?The day before your appointment: write a list of all prescriptions that need to be refilled. ?The day of the appointment: give the list to the admitting nurse. Prescriptions will be written only during appointments. No prescriptions will be written on procedure days. ?If you forget a medication: it will not be "Called in", "Faxed", or "electronically sent". You will  need to get another appointment to get these prescribed. ?No early refills. Do not call asking to have your prescription filled early. ?Prescription Accuracy: You are responsible for carefully inspecting your prescriptions before leaving our office. Have the discharge nurse carefully go over each prescription with you, before taking them home. Make sure that your name is accurately spelled, that your address is correct. Check the name and dose of your medication to make sure it is accurate. Check the number of pills, and the written instructions to make sure they are clear and accurate. Make sure that you are given enough medication to last until your next medication refill appointment. ?Taking Medication: Take medication as prescribed. When it comes to controlled substances, taking less pills or less frequently than prescribed is permitted and encouraged. ?Never take more pills than instructed. ?Never take medication more frequently than prescribed.  ?Inform other Doctors: Always inform, all of your healthcare providers, of all the medications you take. ?Pain Medication from other Providers: You are not allowed to accept any additional pain medication from any other Doctor or Healthcare provider. There are two exceptions to this rule. (see below) In the event that you require additional pain medication, you are responsible for notifying us, as stated below. ?Cough Medicine: Often these contain an opioid, such as codeine or hydrocodone. Never accept or take cough medicine containing these opioids if you are already taking an opioid* medication. The combination may cause respiratory failure and death. ?Medication Agreement: You are responsible for carefully reading and following our Medication Agreement. This must be signed before receiving any prescriptions from our practice. Safely store a copy of your signed Agreement. Violations to the Agreement will result in no further prescriptions. (Additional copies of our  Medication Agreement are available upon request.) ?Laws, Rules, & Regulations: All patients are expected to follow all Federal and [employer], Rules, [employer]. Ignorance of  the Laws does not constitute a valid excuse.  ?Illegal drugs and Controlled Substances: The use of illegal substances (including, but not limited to marijuana and its derivatives) and/or the illegal use of any controlled substances is strictly prohibited. Violation of this rule may result in the immediate and permanent discontinuation of any and all prescriptions being written by our practice. The use of any illegal substances is prohibited. ?Adopted CDC guidelines & recommendations: Target dosing levels will be at or below 60 MME/day. Use of benzodiazepines** is not recommended. ? ?Exceptions: There are only two exceptions to the rule of not receiving pain medications from other Healthcare Providers. ?Exception #1 (Emergencies): In the event of an emergency (i.e.: accident requiring emergency care), you are allowed to receive additional pain medication. However, you are responsible for: As soon as you are able, call our office [phone], at any time of the day or night, and leave a message stating your name, the date and nature of the emergency, and the name and dose of the medication prescribed. In the event that your call is answered by a member of our staff, make sure to document and save the date, time, and the name of the person that took your information.  ?Exception #2 (Planned Surgery): In the event that you are scheduled by another doctor or dentist to have any type of surgery or procedure, you are allowed (for a period no longer than 30 days), to receive additional pain medication, for the acute post-op pain. However, in this case, you are responsible for picking up a copy of our "Post-op Pain Management for Surgeons" handout, and giving it to your surgeon or dentist. This document is available at our office, and  does not require an appointment to obtain it. Simply go to our office during business hours (Monday-Thursday from 8:00 AM to 4:00 PM) (Friday 8:00 AM to 12:00 Noon) or if you have a scheduled appointment with [place], prior to your surgery, and ask for it by name. In addition, you are responsible for: calling our office [phone], at any time of the day or night, and leaving a message stating your name, name of your surgeon, type of surgery, and date of procedure or surgery. Failure to comply with your responsibilities may result in termination of therapy involving the controlled substances. ?Medication Agreement Violation. Following the above rules, including your responsibilities will help you in avoiding a Medication Agreement Violation (?Breaking your Pain Medication Contract?). ? ?*Opioid medications include: morphine, codeine, oxycodone, oxymorphone, hydrocodone, hydromorphone, meperidine, tramadol, tapentadol, buprenorphine, fentanyl, methadone. ?**Benzodiazepine medications include: diazepam (Valium), alprazolam (Xanax), clonazepam (Klonopine), lorazepam (Ativan), clorazepate (Tranxene), chlordiazepoxide (Librium), estazolam (Prosom), oxazepam (Serax), temazepam (Restoril), triazolam (Halcion) ?(Last updated: 05/08/2021) ?____________________________________________________________________________________________ ? ____________________________________________________________________________________________ ? ?Medication Recommendations and Reminders ? ?Applies to: All patients receiving prescriptions (written and/or electronic). ? ?Medication Rules & Regulations: These rules and regulations exist for your safety and that of others. They are not flexible and neither are we. Dismissing or ignoring them will be considered "non-compliance" with medication therapy, resulting in complete and irreversible termination of such therapy. (See document titled "Medication Rules" for more details.) In all conscience,  because of safety reasons, we cannot continue providing a therapy where the patient does not follow instructions. ? ?Pharmacy of record:  ?Definition: This is the pharmacy where your electronic prescriptions w

## 2021-11-13 ENCOUNTER — Ambulatory Visit: Payer: Medicare HMO | Attending: Pain Medicine | Admitting: Pain Medicine

## 2021-11-13 ENCOUNTER — Encounter: Payer: Self-pay | Admitting: Pain Medicine

## 2021-11-13 VITALS — BP 151/63 | HR 55 | Temp 97.0°F | Ht 66.0 in | Wt 149.0 lb

## 2021-11-13 DIAGNOSIS — Z96651 Presence of right artificial knee joint: Secondary | ICD-10-CM | POA: Insufficient documentation

## 2021-11-13 DIAGNOSIS — G894 Chronic pain syndrome: Secondary | ICD-10-CM | POA: Insufficient documentation

## 2021-11-13 DIAGNOSIS — M25561 Pain in right knee: Secondary | ICD-10-CM | POA: Diagnosis not present

## 2021-11-13 DIAGNOSIS — Z79891 Long term (current) use of opiate analgesic: Secondary | ICD-10-CM | POA: Diagnosis not present

## 2021-11-13 DIAGNOSIS — G8929 Other chronic pain: Secondary | ICD-10-CM | POA: Diagnosis not present

## 2021-11-13 DIAGNOSIS — Z79899 Other long term (current) drug therapy: Secondary | ICD-10-CM | POA: Diagnosis not present

## 2021-11-13 MED ORDER — TRAMADOL HCL 50 MG PO TABS: 50.0000 mg | ORAL_TABLET | Freq: Two times a day (BID) | ORAL | 5 refills | Status: DC

## 2021-11-13 NOTE — Progress Notes (Signed)
Nursing Pain Medication Assessment:  ?Safety precautions to be maintained throughout the outpatient stay will include: orient to surroundings, keep bed in low position, maintain call bell within reach at all times, provide assistance with transfer out of bed and ambulation.  ?Medication Inspection Compliance: Pill count conducted under aseptic conditions, in front of the patient. Neither the pills nor the bottle was removed from the patient's sight at any time. Once count was completed pills were immediately returned to the patient in their original bottle. ? ?Medication: Tramadol (Ultram) ?Pill/Patch Count:  23 of 60 pills remain ?Pill/Patch Appearance: Markings consistent with prescribed medication ?Bottle Appearance: Standard pharmacy container. Clearly labeled. ?Filled Date: 3 / 10 / 2023 ?Last Medication intake:  TodaySafety precautions to be maintained throughout the outpatient stay will include: orient to surroundings, keep bed in low position, maintain call bell within reach at all times, provide assistance with transfer out of bed and ambulation.  ?

## 2021-11-22 ENCOUNTER — Ambulatory Visit (INDEPENDENT_AMBULATORY_CARE_PROVIDER_SITE_OTHER): Payer: Medicare HMO

## 2021-11-22 DIAGNOSIS — E538 Deficiency of other specified B group vitamins: Secondary | ICD-10-CM

## 2021-11-22 MED ORDER — CYANOCOBALAMIN 1000 MCG/ML IJ SOLN
1000.0000 ug | Freq: Once | INTRAMUSCULAR | Status: AC
  Administered 2021-11-22: 1000 ug via INTRAMUSCULAR

## 2021-11-22 NOTE — Progress Notes (Signed)
Patient presented for B 12 injection to right deltoid, patient voiced no concerns nor showed any signs of distress during injection. 

## 2021-11-28 ENCOUNTER — Ambulatory Visit: Payer: Medicare HMO

## 2021-12-23 DIAGNOSIS — M7542 Impingement syndrome of left shoulder: Secondary | ICD-10-CM | POA: Diagnosis not present

## 2021-12-24 ENCOUNTER — Encounter: Payer: Self-pay | Admitting: Internal Medicine

## 2021-12-24 DIAGNOSIS — M25512 Pain in left shoulder: Secondary | ICD-10-CM | POA: Insufficient documentation

## 2021-12-25 ENCOUNTER — Ambulatory Visit
Admission: RE | Admit: 2021-12-25 | Discharge: 2021-12-25 | Disposition: A | Discharge status: Home / Self Care | Payer: Medicare HMO | Source: Ambulatory Visit | Attending: Internal Medicine | Admitting: Internal Medicine

## 2021-12-25 DIAGNOSIS — Z1231 Encounter for screening mammogram for malignant neoplasm of breast: Secondary | ICD-10-CM | POA: Diagnosis not present

## 2021-12-31 ENCOUNTER — Encounter: Payer: Self-pay | Admitting: Internal Medicine

## 2021-12-31 ENCOUNTER — Ambulatory Visit (INDEPENDENT_AMBULATORY_CARE_PROVIDER_SITE_OTHER): Payer: Medicare HMO | Admitting: Internal Medicine

## 2021-12-31 VITALS — BP 130/64 | HR 67 | Temp 97.6°F | Resp 14 | Ht 66.0 in | Wt 147.2 lb

## 2021-12-31 DIAGNOSIS — E538 Deficiency of other specified B group vitamins: Secondary | ICD-10-CM | POA: Diagnosis not present

## 2021-12-31 DIAGNOSIS — I10 Essential (primary) hypertension: Secondary | ICD-10-CM

## 2021-12-31 DIAGNOSIS — E7800 Pure hypercholesterolemia, unspecified: Secondary | ICD-10-CM | POA: Diagnosis not present

## 2021-12-31 DIAGNOSIS — K5010 Crohn's disease of large intestine without complications: Secondary | ICD-10-CM

## 2021-12-31 DIAGNOSIS — M1711 Unilateral primary osteoarthritis, right knee: Secondary | ICD-10-CM | POA: Diagnosis not present

## 2021-12-31 DIAGNOSIS — Z0000 Encounter for general adult medical examination without abnormal findings: Secondary | ICD-10-CM | POA: Diagnosis not present

## 2021-12-31 LAB — HEPATIC FUNCTION PANEL
ALT: 11 U/L (ref 0–35)
AST: 20 U/L (ref 0–37)
Albumin: 4.3 g/dL (ref 3.5–5.2)
Alkaline Phosphatase: 71 U/L (ref 39–117)
Bilirubin, Direct: 0.1 mg/dL (ref 0.0–0.3)
Total Bilirubin: 0.4 mg/dL (ref 0.2–1.2)
Total Protein: 6.6 g/dL (ref 6.0–8.3)

## 2021-12-31 LAB — LIPID PANEL
Cholesterol: 153 mg/dL (ref 0–200)
HDL: 70.4 mg/dL (ref >39.00)
LDL Cholesterol: 70 mg/dL (ref 0–99)
NonHDL: 82.89
Total CHOL/HDL Ratio: 2
Triglycerides: 63 mg/dL (ref 0.0–149.0)
VLDL: 12.6 mg/dL (ref 0.0–40.0)

## 2021-12-31 LAB — BASIC METABOLIC PANEL
BUN: 15 mg/dL (ref 6–23)
CO2: 31 mEq/L (ref 19–32)
Calcium: 9.5 mg/dL (ref 8.4–10.5)
Chloride: 105 mEq/L (ref 96–112)
Creatinine, Ser: 0.82 mg/dL (ref 0.40–1.20)
GFR: 69.63 mL/min (ref >60.00)
Glucose, Bld: 82 mg/dL (ref 70–99)
Potassium: 3.5 mEq/L (ref 3.5–5.1)
Sodium: 142 mEq/L (ref 135–145)

## 2021-12-31 MED ORDER — CYANOCOBALAMIN 1000 MCG/ML IJ SOLN
1000.0000 ug | Freq: Once | INTRAMUSCULAR | Status: AC
  Administered 2021-12-31: 1000 ug via INTRAMUSCULAR

## 2021-12-31 NOTE — Progress Notes (Signed)
Patient ID: Ellen Drake, female   DOB: 11-15-1945, 76 y.o.   MRN: [id number]   Subjective:    Patient ID: Ellen Drake, female    DOB: [birth year], 76 y.o.   MRN: [id number]   Patient here for a physical exam.   Chief Complaint  Patient presents with   Follow-up    Yearly CPE   Hypertension   Hyperlipidemia   .   HPI Increased stress - husband's health issues.  Discussed.  Does not feel needs any further intervention.  No chest pain.  Tries to stay active.  Breathing stable.  No acid reflux.  No abdominal pain.  Bowels moving.  Persistent knee pain.  May be some better.  Has seen multiple specialists.  Due colonoscopy 05/2022.    Past Medical History:  Diagnosis Date   Anemia    reslved   Colon polyp 2009   Crohn's disease ([place])    Diverticulosis    Fibrocystic breast disease    Herniated nucleus pulposus of lumbosacral region    History of chicken pox    Hypercholesterolemia    Hyperlipidemia    Hypertension    Osteoarthritis    Pulmonary nodule seen on imaging study 2004   resolved 2007   Past Surgical History:  Procedure Laterality Date   anterior cervical discectomy and fusion  1998   Dr Cherene Altes SURGERY  1992   ruptured disc (Dr Mauri Pole)   BREAST BIOPSY Left    neg-no scar seen   CATARACT EXTRACTION W/PHACO Right 02/09/2019   Procedure: CATARACT EXTRACTION PHACO AND INTRAOCULAR LENS PLACEMENT ([place]) [place]  RIGHT HEALON 5, VISION BLUE;  Surgeon: Leandrew Koyanagi, MD;  Location: [facility];  Service: Ophthalmology;  Laterality: Right;   COLECTOMY  2003   [place]   COLONOSCOPY  2009   COLONOSCOPY W/ BIOPSIES  11/30/2012   Procedure: COLONOSCOPY W/BIOPSY; Surgeon: Colvin Caroli, MD; Location: [place]; Service: Gastroenterology;;   COLONOSCOPY W/ BIOPSIES  04/15/2016   Procedure: Colonoscopy ; Surgeon: Colvin Caroli, MD; Location: [place]; Service: Gastroenterology;  Laterality: N/A;   KNEE ARTHROSCOPY W/ SYNOVECTOMY Right 03/10/2014   limited   KNEE SURGERY Right 1/16   [facility] (Dr. Sherlynn Carbon)   [place] / DECOMPRESSION LUMBAR SPINE  1999   REPLACEMENT TOTAL KNEE     ROTATOR CUFF REPAIR Left 2007   acute open; accident   Family History  Problem Relation Age of Onset   CVA Mother    Hypertension Mother    Hypertension Sister        brain tumor   Breast cancer Neg Hx    Colon cancer Neg Hx    Social History   Socioeconomic History   Marital status: Married    Spouse name: Not on file   Number of children: 0   Years of education: 12   Highest education level: Not on file  Occupational History   Not on file  Tobacco Use   Smoking status: Former    Types: Cigarettes    Quit date: 09/10/2009    Years since quitting: 12.3   Smokeless tobacco: Never  Vaping Use   Vaping Use: Never used  Substance and Sexual Activity   Alcohol use: Yes    Alcohol/week: 1.0 standard drink    Types: 1 Glasses of wine per week    Comment: wine occasional; socially   Drug use: No   Sexual  activity: Yes  Other Topics Concern   Not on file  Social History Narrative   Not on file   Social Determinants of Health   Financial Resource Strain: Low Risk    Difficulty of Paying Living Expenses: Not hard at all  Food Insecurity: No Food Insecurity   Worried About Charity fundraiser in the Last Year: Never true   [place] in the Last Year: Never true  Transportation Needs: No Transportation Needs   Lack of Transportation (Medical): No   Lack of Transportation (Non-Medical): No  Physical Activity: Unknown   Days of Exercise per Week: 0 days   Minutes of Exercise per Session: Not on file  Stress: No Stress Concern Present   Feeling of Stress : Not at all  Social Connections: Unknown   Frequency of Communication with Friends and Family: More than three times a week   Frequency of Social Gatherings with Friends and Family: More than  three times a week   Attends Religious Services: Not on Electrical engineer or Organizations: Yes   Attends Archivist Meetings: More than 4 times per year   Marital Status: Married     Review of Systems  Constitutional:  Negative for appetite change and unexpected weight change.  HENT:  Negative for congestion, sinus pressure and sore throat.   Eyes:  Negative for pain and visual disturbance.  Respiratory:  Negative for cough, chest tightness and shortness of breath.   Cardiovascular:  Negative for chest pain, palpitations and leg swelling.  Gastrointestinal:  Negative for abdominal pain, constipation, diarrhea and vomiting.  Genitourinary:  Negative for difficulty urinating and dysuria.  Musculoskeletal:  Negative for joint swelling and myalgias.  Skin:  Negative for color change and rash.  Neurological:  Negative for dizziness, light-headedness and headaches.  Hematological:  Negative for adenopathy. Does not bruise/bleed easily.  Psychiatric/Behavioral:  Negative for agitation and dysphoric mood.       Objective:     BP 130/64 (BP Location: Left Arm, Patient Position: Sitting, Cuff Size: Small)   Pulse 67   Temp 97.6 F (36.4 C) (Temporal)   Resp 14   Ht 5' 6"  (1.676 m)   Wt 147 lb 3.2 oz (66.8 kg)   LMP 08/13/1995   SpO2 98%   BMI 23.76 kg/m  Wt Readings from Last 3 Encounters:  12/31/21 147 lb 3.2 oz (66.8 kg)  11/13/21 149 lb (67.6 kg)  10/03/21 149 lb (67.6 kg)    Physical Exam Vitals reviewed.  Constitutional:      General: She is not in acute distress.    Appearance: Normal appearance. She is well-developed.  HENT:     Head: Normocephalic and atraumatic.     Right Ear: External ear normal.     Left Ear: External ear normal.  Eyes:     General: No scleral icterus.       Right eye: No discharge.        Left eye: No discharge.     Conjunctiva/sclera: Conjunctivae normal.  Neck:     Thyroid: No thyromegaly.  Cardiovascular:      Rate and Rhythm: Normal rate and regular rhythm.  Pulmonary:     Effort: No tachypnea, accessory muscle usage or respiratory distress.     Breath sounds: Normal breath sounds. No decreased breath sounds or wheezing.  Chest:  Breasts:    Right: No inverted nipple, mass, nipple discharge or tenderness (no axillary adenopathy).  Left: No inverted nipple, mass, nipple discharge or tenderness (no axilarry adenopathy).  Abdominal:     General: Bowel sounds are normal.     Palpations: Abdomen is soft.     Tenderness: There is no abdominal tenderness.  Musculoskeletal:        General: No swelling or tenderness.     Cervical back: Neck supple.  Lymphadenopathy:     Cervical: No cervical adenopathy.  Skin:    Findings: No erythema or rash.  Neurological:     Mental Status: She is alert and oriented to person, place, and time.  Psychiatric:        Mood and Affect: Mood normal.        Behavior: Behavior normal.     Outpatient Encounter Medications as of 12/31/2021  Medication Sig   amLODipine (NORVASC) 10 MG tablet TAKE 1 TABLET EVERY DAY   ASPIRIN 81 PO Take by mouth daily.   atenolol (TENORMIN) 50 MG tablet TAKE 1 TABLET EVERY DAY   brimonidine (ALPHAGAN) 0.2 % ophthalmic solution 1 drop 2 (two) times daily.    clotrimazole-betamethasone (LOTRISONE) cream APPLY TO AFFECTED AREA TWICE A DAY   gabapentin (NEURONTIN) 100 MG capsule Take 2 capsules (200 mg total) by mouth at bedtime.   latanoprost (XALATAN) 0.005 % ophthalmic solution Place 1 drop into both eyes at bedtime.    lisinopril (ZESTRIL) 40 MG tablet TAKE 1 TABLET EVERY DAY   nystatin cream (MYCOSTATIN) Apply 1 application topically 2 (two) times daily.   ondansetron (ZOFRAN) 4 MG tablet Take 1 tablet (4 mg total) by mouth every 12 (twelve) hours as needed for nausea or vomiting.   rosuvastatin (CRESTOR) 10 MG tablet TAKE 1 TABLET EVERY DAY   sulfaSALAzine (AZULFIDINE) 500 MG tablet Take 1,500 mg by mouth 2 (two) times daily. 3  tabs   timolol (TIMOPTIC) 0.5 % ophthalmic solution Place 1 drop into both eyes 2 (two) times daily.   traMADol (ULTRAM) 50 MG tablet Take 1 tablet (50 mg total) by mouth 2 (two) times daily. Each refill must last 30 days.   [EXPIRED] cyanocobalamin ((VITAMIN B-12)) injection 1,000 mcg    No facility-administered encounter medications on file as of 12/31/2021.     Lab Results  Component Value Date   WBC 6.7 09/24/2021   HGB 12.5 09/24/2021   HCT 37.9 09/24/2021   PLT 165.0 09/24/2021   GLUCOSE 82 12/31/2021   CHOL 153 12/31/2021   TRIG 63.0 12/31/2021   HDL 70.40 12/31/2021   LDLDIRECT 132.5 05/06/2013   LDLCALC 70 12/31/2021   ALT 11 12/31/2021   AST 20 12/31/2021   NA 142 12/31/2021   K 3.5 12/31/2021   CL 105 12/31/2021   CREATININE 0.82 12/31/2021   BUN 15 12/31/2021   CO2 31 12/31/2021   TSH 3.56 09/24/2021   HGBA1C 5.7 10/19/2012    MM 3D SCREEN BREAST BILATERAL  Result Date: 12/25/2021 CLINICAL DATA:  Screening. EXAM: DIGITAL SCREENING BILATERAL MAMMOGRAM WITH TOMOSYNTHESIS AND CAD TECHNIQUE: Bilateral screening digital craniocaudal and mediolateral oblique mammograms were obtained. Bilateral screening digital breast tomosynthesis was performed. The images were evaluated with computer-aided detection. COMPARISON:  Previous exam(s). ACR Breast Density Category b: There are scattered areas of fibroglandular density. FINDINGS: There are no findings suspicious for malignancy. IMPRESSION: No mammographic evidence of malignancy. A result letter of this screening mammogram will be mailed directly to the patient. RECOMMENDATION: Screening mammogram in one year. (Code:[id number]-B-01Y) BI-RADS CATEGORY  1: Negative. Electronically Signed   By: Marcello Moores  Lawrence M.D.   On: 12/25/2021 16:22       Assessment & Plan:   Problem List Items Addressed This Visit     Crohn's disease ([place])    Stable.  Continue sulfasalazine.  Being followed by GI ([facility]).        Health care maintenance     Physical today 12/31/21.  Colonoscopy 04/2019 - tubular adenoma.  Follow up colonoscopy in 3 years.  Mammogram 12/25/21 - Birads I.         Hypercholesterolemia    Continue crestor.  Follow lipid panel and liver function tests.        Relevant Orders   Lipid Profile (Completed)   Hepatic function panel (Completed)   Hypertension    Continue lisinopril, amlodipine and atenolol.   Follow pressures.  Follow metabolic panel.  Have asked her to spot check her pressures and send in readings.        Relevant Orders   Basic Metabolic Panel (BMET) (Completed)   Vitamin B12 deficiency    Continue b12 injections.        Osteoarthritis of knee (Right) (Chronic)    Has had several surgeries on her knee.  Now being followed by pain clinic.  Pain is some better.  Follow.        Other Visit Diagnoses     Routine general medical examination at a health care facility    -  Primary   B12 deficiency       Relevant Medications   cyanocobalamin ((VITAMIN B-12)) injection 1,000 mcg (Completed)        Einar Pheasant, MD

## 2022-01-01 ENCOUNTER — Telehealth: Payer: Self-pay

## 2022-01-01 NOTE — Telephone Encounter (Signed)
LMTCB for lab results.  

## 2022-01-01 NOTE — Telephone Encounter (Signed)
See result note.  

## 2022-01-01 NOTE — Telephone Encounter (Signed)
Patient returned Ellen Drake, CMA's call regarding lab results.

## 2022-01-05 ENCOUNTER — Encounter: Payer: Self-pay | Admitting: Internal Medicine

## 2022-01-05 NOTE — Assessment & Plan Note (Signed)
Continue lisinopril, amlodipine and atenolol.   Follow pressures.  Follow metabolic panel.  Have asked her to spot check her pressures and send in readings.

## 2022-01-05 NOTE — Assessment & Plan Note (Signed)
Physical today 12/31/21.  Colonoscopy 04/2019 - tubular adenoma.  Follow up colonoscopy in 3 years.  Mammogram 12/25/21 - Birads I.

## 2022-01-05 NOTE — Assessment & Plan Note (Signed)
Has had several surgeries on her knee.  Now being followed by pain clinic.  Pain is some better.  Follow.

## 2022-01-05 NOTE — Assessment & Plan Note (Signed)
Stable.  Continue sulfasalazine.  Being followed by GI ([facility]).

## 2022-01-05 NOTE — Assessment & Plan Note (Signed)
Continue b12 injections.

## 2022-01-05 NOTE — Assessment & Plan Note (Signed)
Continue crestor.  Follow lipid panel and liver function tests.

## 2022-01-13 ENCOUNTER — Other Ambulatory Visit: Payer: Self-pay | Admitting: Internal Medicine

## 2022-02-04 ENCOUNTER — Ambulatory Visit (INDEPENDENT_AMBULATORY_CARE_PROVIDER_SITE_OTHER): Payer: Medicare HMO

## 2022-02-04 DIAGNOSIS — E538 Deficiency of other specified B group vitamins: Secondary | ICD-10-CM

## 2022-02-04 MED ORDER — CYANOCOBALAMIN 1000 MCG/ML IJ SOLN
1000.0000 ug | Freq: Once | INTRAMUSCULAR | Status: AC
  Administered 2022-02-04: 1000 ug via INTRAMUSCULAR

## 2022-02-04 NOTE — Progress Notes (Signed)
Patient presented for B 12 injection to left deltoid, patient voiced no concerns nor showed any signs of distress during injection. 

## 2022-02-21 ENCOUNTER — Other Ambulatory Visit: Payer: Self-pay | Admitting: Pain Medicine

## 2022-02-21 DIAGNOSIS — Z79899 Other long term (current) drug therapy: Secondary | ICD-10-CM

## 2022-02-21 DIAGNOSIS — G8929 Other chronic pain: Secondary | ICD-10-CM

## 2022-02-21 DIAGNOSIS — G894 Chronic pain syndrome: Secondary | ICD-10-CM

## 2022-02-21 DIAGNOSIS — Z79891 Long term (current) use of opiate analgesic: Secondary | ICD-10-CM

## 2022-02-24 ENCOUNTER — Telehealth: Payer: Self-pay | Admitting: Pain Medicine

## 2022-02-24 NOTE — Telephone Encounter (Signed)
Pt stated that when she contact [facility] in [place] she was told her prescription had expires. Patient stated that she is supposed to have 3 more refills. Please give patient a call. Thanks

## 2022-02-24 NOTE — Telephone Encounter (Signed)
I called [facility], there is a prescription ready to be picked up. Patient notified.

## 2022-03-06 ENCOUNTER — Ambulatory Visit (INDEPENDENT_AMBULATORY_CARE_PROVIDER_SITE_OTHER): Payer: Medicare HMO

## 2022-03-06 DIAGNOSIS — E538 Deficiency of other specified B group vitamins: Secondary | ICD-10-CM | POA: Diagnosis not present

## 2022-03-06 MED ORDER — CYANOCOBALAMIN 1000 MCG/ML IJ SOLN
1000.0000 ug | Freq: Once | INTRAMUSCULAR | Status: AC
  Administered 2022-03-06: 1000 ug via INTRAMUSCULAR

## 2022-03-06 NOTE — Progress Notes (Signed)
presents today for injection per MD orders. B12 injection administered IM in left Upper Arm. Administration without incident. Patient tolerated well.  Ellen Drake,cma

## 2022-03-11 DIAGNOSIS — H401133 Primary open-angle glaucoma, bilateral, severe stage: Secondary | ICD-10-CM | POA: Diagnosis not present

## 2022-03-11 DIAGNOSIS — Z0100 Encounter for examination of eyes and vision without abnormal findings: Secondary | ICD-10-CM | POA: Diagnosis not present

## 2022-03-12 ENCOUNTER — Ambulatory Visit (INDEPENDENT_AMBULATORY_CARE_PROVIDER_SITE_OTHER): Payer: Medicare HMO | Admitting: Internal Medicine

## 2022-03-12 ENCOUNTER — Encounter: Payer: Self-pay | Admitting: Internal Medicine

## 2022-03-12 DIAGNOSIS — K5010 Crohn's disease of large intestine without complications: Secondary | ICD-10-CM | POA: Diagnosis not present

## 2022-03-12 DIAGNOSIS — E7800 Pure hypercholesterolemia, unspecified: Secondary | ICD-10-CM | POA: Diagnosis not present

## 2022-03-12 DIAGNOSIS — M1711 Unilateral primary osteoarthritis, right knee: Secondary | ICD-10-CM | POA: Diagnosis not present

## 2022-03-12 DIAGNOSIS — E538 Deficiency of other specified B group vitamins: Secondary | ICD-10-CM

## 2022-03-12 DIAGNOSIS — H401133 Primary open-angle glaucoma, bilateral, severe stage: Secondary | ICD-10-CM | POA: Diagnosis not present

## 2022-03-12 DIAGNOSIS — I10 Essential (primary) hypertension: Secondary | ICD-10-CM

## 2022-03-12 MED ORDER — HYDROCHLOROTHIAZIDE 12.5 MG PO CAPS: 12.5000 mg | ORAL_CAPSULE | Freq: Every day | ORAL | 2 refills | Status: DC

## 2022-03-12 NOTE — Progress Notes (Signed)
Patient ID: Shaylen Day Thayne, female   DOB: [birth year], 76 y.o.   MRN: [id number]   Subjective:    Patient ID: Arnoldo Hooker Day Eble, female    DOB: Oct 09, 1945, 76 y.o.   MRN: [id number]   Patient here for a scheduled follow up.  Marland Kitchen   HPI Here to follow up regarding her blood pressure.  Increased stress recently with her husband's medical issues.  Discussed.  Husband is doing better now.  Decreased stress.  Feels better.  Tries to stay active. No chest pain or sob reported.  No abdominal pain.  Bowels moving.  Reviewed blood pressure readings - outside checks - 140s/70-80.     Past Medical History:  Diagnosis Date   Anemia    reslved   Colon polyp 2009   Crohn's disease ([place])    Diverticulosis    Fibrocystic breast disease    Herniated nucleus pulposus of lumbosacral region    History of chicken pox    Hypercholesterolemia    Hyperlipidemia    Hypertension    Osteoarthritis    Pulmonary nodule seen on imaging study 2004   resolved 2007   Past Surgical History:  Procedure Laterality Date   anterior cervical discectomy and fusion  1998   Dr Cherene Altes SURGERY  1992   ruptured disc (Dr Mauri Pole)   BREAST BIOPSY Left    neg-no scar seen   CATARACT EXTRACTION W/PHACO Right 02/09/2019   Procedure: CATARACT EXTRACTION PHACO AND INTRAOCULAR LENS PLACEMENT ([place]) [place]  RIGHT HEALON 5, VISION BLUE;  Surgeon: Leandrew Koyanagi, MD;  Location: [place];  Service: Ophthalmology;  Laterality: Right;   COLECTOMY  2003   [place]   COLONOSCOPY  2009   COLONOSCOPY W/ BIOPSIES  11/30/2012   Procedure: COLONOSCOPY W/BIOPSY; Surgeon: Colvin Caroli, MD; Location: [place]; Service: Gastroenterology;;   COLONOSCOPY W/ BIOPSIES  04/15/2016   Procedure: Colonoscopy ; Surgeon: Colvin Caroli, MD; Location: [place]; Service: Gastroenterology; Laterality: N/A;   KNEE ARTHROSCOPY W/ SYNOVECTOMY Right 03/10/2014   limited    KNEE SURGERY Right 1/16   [facility] (Dr. Sherlynn Carbon)   [place] / DECOMPRESSION LUMBAR SPINE  1999   REPLACEMENT TOTAL KNEE     ROTATOR CUFF REPAIR Left 2007   acute open; accident   Family History  Problem Relation Age of Onset   CVA Mother    Hypertension Mother    Hypertension Sister        brain tumor   Breast cancer Neg Hx    Colon cancer Neg Hx    Social History   Socioeconomic History   Marital status: Married    Spouse name: Not on file   Number of children: 0   Years of education: 12   Highest education level: Not on file  Occupational History   Not on file  Tobacco Use   Smoking status: Former    Types: Cigarettes    Quit date: 09/10/2009    Years since quitting: 12.5   Smokeless tobacco: Never  Vaping Use   Vaping Use: Never used  Substance and Sexual Activity   Alcohol use: Yes    Alcohol/week: 1.0 standard drink of alcohol    Types: 1 Glasses of wine per week    Comment: wine occasional; socially   Drug use: No   Sexual activity: Yes  Other Topics Concern   Not on file  Social History Narrative  Not on file   Social Determinants of Health   Financial Resource Strain: Low Risk  (07/17/2021)   Overall Financial Resource Strain (CARDIA)    Difficulty of Paying Living Expenses: Not hard at all  Food Insecurity: No Food Insecurity (07/17/2021)   Hunger Vital Sign    Worried About Running Out of Food in the Last Year: Never true    Ran Out of Food in the Last Year: Never true  Transportation Needs: No Transportation Needs (07/17/2021)   PRAPARE - Hydrologist (Medical): No    Lack of Transportation (Non-Medical): No  Physical Activity: Unknown (07/17/2021)   Exercise Vital Sign    Days of Exercise per Week: 0 days    Minutes of Exercise per Session: Not on file  Stress: No Stress Concern Present (07/17/2021)   [place]    Feeling of Stress  : Not at all  Social Connections: Unknown (07/17/2021)   Social Connection and Isolation Panel [NHANES]    Frequency of Communication with Friends and Family: More than three times a week    Frequency of Social Gatherings with Friends and Family: More than three times a week    Attends Religious Services: Not on Advertising copywriter or Organizations: Yes    Attends Archivist Meetings: More than 4 times per year    Marital Status: Married     Review of Systems  Constitutional:  Negative for appetite change and unexpected weight change.  HENT:  Negative for congestion and sinus pressure.   Respiratory:  Negative for cough, chest tightness and shortness of breath.   Cardiovascular:  Negative for chest pain, palpitations and leg swelling.  Gastrointestinal:  Negative for abdominal pain, diarrhea, nausea and vomiting.  Genitourinary:  Negative for difficulty urinating and dysuria.  Musculoskeletal:  Negative for myalgias.       Persistent knee pain.  Chronic.   Skin:  Negative for color change and rash.  Neurological:  Negative for dizziness, light-headedness and headaches.  Psychiatric/Behavioral:  Negative for agitation and dysphoric mood.        Objective:     BP 128/72 (BP Location: Left Arm, Patient Position: Sitting, Cuff Size: Small)   Pulse 85   Temp 97.9 F (36.6 C) (Temporal)   Resp 16   Ht 5' 6"  (1.676 m)   Wt 145 lb 6.4 oz (66 kg)   LMP 08/13/1995   SpO2 98%   BMI 23.47 kg/m  Wt Readings from Last 3 Encounters:  03/12/22 145 lb 6.4 oz (66 kg)  12/31/21 147 lb 3.2 oz (66.8 kg)  11/13/21 149 lb (67.6 kg)    Physical Exam Vitals reviewed.  Constitutional:      General: She is not in acute distress.    Appearance: Normal appearance.  HENT:     Head: Normocephalic and atraumatic.     Right Ear: External ear normal.     Left Ear: External ear normal.  Eyes:     General: No scleral icterus.       Right eye: No discharge.        Left eye:  No discharge.     Conjunctiva/sclera: Conjunctivae normal.  Neck:     Thyroid: No thyromegaly.  Cardiovascular:     Rate and Rhythm: Normal rate and regular rhythm.  Pulmonary:     Effort: No respiratory distress.     Breath sounds: Normal breath  sounds. No wheezing.  Abdominal:     General: Bowel sounds are normal.     Palpations: Abdomen is soft.     Tenderness: There is no abdominal tenderness.  Musculoskeletal:        General: No swelling or tenderness.     Cervical back: Neck supple. No tenderness.  Lymphadenopathy:     Cervical: No cervical adenopathy.  Skin:    Findings: No erythema or rash.  Neurological:     Mental Status: She is alert.  Psychiatric:        Mood and Affect: Mood normal.        Behavior: Behavior normal.      Outpatient Encounter Medications as of 03/12/2022  Medication Sig   amLODipine (NORVASC) 10 MG tablet TAKE 1 TABLET EVERY DAY   ASPIRIN 81 PO Take by mouth daily.   atenolol (TENORMIN) 50 MG tablet TAKE 1 TABLET EVERY DAY   brimonidine (ALPHAGAN) 0.2 % ophthalmic solution 1 drop 2 (two) times daily.    clotrimazole-betamethasone (LOTRISONE) cream APPLY TO AFFECTED AREA TWICE A DAY   gabapentin (NEURONTIN) 100 MG capsule Take 2 capsules (200 mg total) by mouth at bedtime.   hydrochlorothiazide (MICROZIDE) 12.5 MG capsule Take 1 capsule (12.5 mg total) by mouth daily.   latanoprost (XALATAN) 0.005 % ophthalmic solution Place 1 drop into both eyes at bedtime.    lisinopril (ZESTRIL) 40 MG tablet TAKE 1 TABLET EVERY DAY   nystatin cream (MYCOSTATIN) Apply 1 application topically 2 (two) times daily.   ondansetron (ZOFRAN) 4 MG tablet Take 1 tablet (4 mg total) by mouth every 12 (twelve) hours as needed for nausea or vomiting.   rosuvastatin (CRESTOR) 10 MG tablet TAKE 1 TABLET EVERY DAY   sulfaSALAzine (AZULFIDINE) 500 MG tablet Take 1,500 mg by mouth 2 (two) times daily. 3 tabs   timolol (TIMOPTIC) 0.5 % ophthalmic solution Place 1 drop into both  eyes 2 (two) times daily.   traMADol (ULTRAM) 50 MG tablet Take 1 tablet (50 mg total) by mouth 2 (two) times daily. Each refill must last 30 days.   No facility-administered encounter medications on file as of 03/12/2022.     Lab Results  Component Value Date   WBC 6.7 09/24/2021   HGB 12.5 09/24/2021   HCT 37.9 09/24/2021   PLT 165.0 09/24/2021   GLUCOSE 82 12/31/2021   CHOL 153 12/31/2021   TRIG 63.0 12/31/2021   HDL 70.40 12/31/2021   LDLDIRECT 132.5 05/06/2013   LDLCALC 70 12/31/2021   ALT 11 12/31/2021   AST 20 12/31/2021   NA 142 12/31/2021   K 3.5 12/31/2021   CL 105 12/31/2021   CREATININE 0.82 12/31/2021   BUN 15 12/31/2021   CO2 31 12/31/2021   TSH 3.56 09/24/2021   HGBA1C 5.7 10/19/2012    MM 3D SCREEN BREAST BILATERAL  Result Date: 12/25/2021 CLINICAL DATA:  Screening. EXAM: DIGITAL SCREENING BILATERAL MAMMOGRAM WITH TOMOSYNTHESIS AND CAD TECHNIQUE: Bilateral screening digital craniocaudal and mediolateral oblique mammograms were obtained. Bilateral screening digital breast tomosynthesis was performed. The images were evaluated with computer-aided detection. COMPARISON:  Previous exam(s). ACR Breast Density Category b: There are scattered areas of fibroglandular density. FINDINGS: There are no findings suspicious for malignancy. IMPRESSION: No mammographic evidence of malignancy. A result letter of this screening mammogram will be mailed directly to the patient. RECOMMENDATION: Screening mammogram in one year. (Code:SM-B-01Y) BI-RADS CATEGORY  1: Negative. Electronically Signed   By: Evangeline Dakin M.D.   On: 12/25/2021 16:22  Assessment & Plan:   Problem List Items Addressed This Visit     Crohn's colitis ([place])    Stable.  Continue sulfasalazine.  Being followed by GI ([facility]).       Hypercholesterolemia    Continue crestor.  Follow lipid panel and liver function tests.       Relevant Medications   hydrochlorothiazide (MICROZIDE) 12.5 MG capsule    Other Relevant Orders   Hepatic function panel   Lipid panel   Hypertension    Continue lisinopril, amlodipine and atenolol.  Given persistent elevation, add hctz 12.62m q day.  Follow pressures.  Follow metabolic panel.  Have asked her to spot check her pressures and send in readings.       Relevant Medications   hydrochlorothiazide (MICROZIDE) 12.5 MG capsule   Other Relevant Orders   Basic metabolic panel   Vitamin BP94deficiency    Continue B12 injections.       Osteoarthritis of knee (Right) (Chronic)    Has had several surgeries on her knee.  Now being followed by pain clinic.  Pain stable.  Follow.         CEinar Pheasant MD

## 2022-03-23 ENCOUNTER — Encounter: Payer: Self-pay | Admitting: Internal Medicine

## 2022-03-23 NOTE — Assessment & Plan Note (Signed)
Continue crestor.  Follow lipid panel and liver function tests.

## 2022-03-23 NOTE — Assessment & Plan Note (Signed)
Continue lisinopril, amlodipine and atenolol.  Given persistent elevation, add hctz 12.52m q day.  Follow pressures.  Follow metabolic panel.  Have asked her to spot check her pressures and send in readings.

## 2022-03-23 NOTE — Assessment & Plan Note (Signed)
Has had several surgeries on her knee.  Now being followed by pain clinic.  Pain stable.  Follow.

## 2022-03-23 NOTE — Assessment & Plan Note (Signed)
Continue B12 injections.

## 2022-03-23 NOTE — Assessment & Plan Note (Signed)
Stable.  Continue sulfasalazine.  Being followed by GI ([facility]).

## 2022-03-27 ENCOUNTER — Telehealth: Payer: Self-pay

## 2022-03-27 NOTE — Telephone Encounter (Signed)
When I called to speak with patient's husband she stated that there was some forms that were to be filled out & sent to [facility]. She was wondering had this been done already & faxed?

## 2022-04-01 NOTE — Telephone Encounter (Signed)
Pt advised papers were completed and faxed.

## 2022-04-01 NOTE — Telephone Encounter (Signed)
Do you have a copy of the driving form.  I think has already been faxed.  Need to confirm

## 2022-04-08 ENCOUNTER — Ambulatory Visit (INDEPENDENT_AMBULATORY_CARE_PROVIDER_SITE_OTHER): Payer: Medicare HMO

## 2022-04-08 DIAGNOSIS — E538 Deficiency of other specified B group vitamins: Secondary | ICD-10-CM | POA: Diagnosis not present

## 2022-04-08 MED ORDER — CYANOCOBALAMIN 1000 MCG/ML IJ SOLN
1000.0000 ug | Freq: Once | INTRAMUSCULAR | Status: AC
  Administered 2022-04-08: 1000 ug via INTRAMUSCULAR

## 2022-04-08 NOTE — Progress Notes (Signed)
Pt presented today for b12 injection. Right deltoid, IM. Pt voiced no concerns nor showed any signs of distress during injection.

## 2022-04-23 ENCOUNTER — Ambulatory Visit (INDEPENDENT_AMBULATORY_CARE_PROVIDER_SITE_OTHER): Payer: Medicare HMO | Admitting: Internal Medicine

## 2022-04-23 ENCOUNTER — Encounter: Payer: Self-pay | Admitting: Internal Medicine

## 2022-04-23 VITALS — BP 126/66 | HR 60 | Temp 97.6°F | Ht 66.0 in | Wt 146.6 lb

## 2022-04-23 DIAGNOSIS — M1711 Unilateral primary osteoarthritis, right knee: Secondary | ICD-10-CM | POA: Diagnosis not present

## 2022-04-23 DIAGNOSIS — Z23 Encounter for immunization: Secondary | ICD-10-CM

## 2022-04-23 DIAGNOSIS — K5010 Crohn's disease of large intestine without complications: Secondary | ICD-10-CM | POA: Diagnosis not present

## 2022-04-23 DIAGNOSIS — I10 Essential (primary) hypertension: Secondary | ICD-10-CM

## 2022-04-23 DIAGNOSIS — E538 Deficiency of other specified B group vitamins: Secondary | ICD-10-CM

## 2022-04-23 DIAGNOSIS — E7800 Pure hypercholesterolemia, unspecified: Secondary | ICD-10-CM

## 2022-04-23 LAB — BASIC METABOLIC PANEL
BUN: 18 mg/dL (ref 6–23)
CO2: 30 mEq/L (ref 19–32)
Calcium: 9.9 mg/dL (ref 8.4–10.5)
Chloride: 104 mEq/L (ref 96–112)
Creatinine, Ser: 0.81 mg/dL (ref 0.40–1.20)
GFR: 70.51 mL/min (ref >60.00)
Glucose, Bld: 83 mg/dL (ref 70–99)
Potassium: 4 mEq/L (ref 3.5–5.1)
Sodium: 141 mEq/L (ref 135–145)

## 2022-04-23 LAB — LIPID PANEL
Cholesterol: 151 mg/dL (ref 0–200)
HDL: 70.3 mg/dL (ref >39.00)
LDL Cholesterol: 64 mg/dL (ref 0–99)
NonHDL: 80.39
Total CHOL/HDL Ratio: 2
Triglycerides: 83 mg/dL (ref 0.0–149.0)
VLDL: 16.6 mg/dL (ref 0.0–40.0)

## 2022-04-23 LAB — HEPATIC FUNCTION PANEL
ALT: 17 U/L (ref 0–35)
AST: 27 U/L (ref 0–37)
Albumin: 4.1 g/dL (ref 3.5–5.2)
Alkaline Phosphatase: 62 U/L (ref 39–117)
Bilirubin, Direct: 0.1 mg/dL (ref 0.0–0.3)
Total Bilirubin: 0.4 mg/dL (ref 0.2–1.2)
Total Protein: 7.2 g/dL (ref 6.0–8.3)

## 2022-04-23 NOTE — Progress Notes (Signed)
Patient ID: Ellen Drake, female   DOB: [birth year], 76 y.o.   MRN: [id number]   Subjective:    Patient ID: Ellen Drake, female    DOB: 13-Jun-1946, 76 y.o.   MRN: [id number]   Patient here for  Chief Complaint  Patient presents with   Follow-up    Follow up on hypertension   .   HPI Reports she is doing better overall.  Husband has been doing better.  Decreased stress.  Tries to stay active.  No chest pain.  Breathing stable.  No acid reflux.  No abdominal pain.  Bowels moving.  Persistent knee pain and swelling.  Has seen multiple specialists and now being followed at pain clinic.  Has f/u 05/12/22.  Stable.  Blood pressure doing well.  F/u with GI 06/2022.    Past Medical History:  Diagnosis Date   Anemia    reslved   Colon polyp 2009   Crohn's disease ([place])    Diverticulosis    Fibrocystic breast disease    Herniated nucleus pulposus of lumbosacral region    History of chicken pox    Hypercholesterolemia    Hyperlipidemia    Hypertension    Osteoarthritis    Pulmonary nodule seen on imaging study 2004   resolved 2007   Past Surgical History:  Procedure Laterality Date   anterior cervical discectomy and fusion  1998   Dr Cherene Altes SURGERY  1992   ruptured disc (Dr Mauri Pole)   BREAST BIOPSY Left    neg-no scar seen   CATARACT EXTRACTION W/PHACO Right 02/09/2019   Procedure: CATARACT EXTRACTION PHACO AND INTRAOCULAR LENS PLACEMENT ([place]) [place]  RIGHT HEALON 5, VISION BLUE;  Surgeon: Leandrew Koyanagi, MD;  Location: [place];  Service: Ophthalmology;  Laterality: Right;   COLECTOMY  2003   [place]   COLONOSCOPY  2009   COLONOSCOPY W/ BIOPSIES  11/30/2012   Procedure: COLONOSCOPY W/BIOPSY; Surgeon: [place] Caroli, MD; Location: [place]; Service: Gastroenterology;;   COLONOSCOPY W/ BIOPSIES  04/15/2016   Procedure: Colonoscopy ; Surgeon: Colvin Caroli, MD; Location: [place];  Service: Gastroenterology; Laterality: N/A;   KNEE ARTHROSCOPY W/ SYNOVECTOMY Right 03/10/2014   limited   KNEE SURGERY Right 1/16   [facility] (Dr. Sherlynn Carbon)   [place] / DECOMPRESSION LUMBAR SPINE  1999   REPLACEMENT TOTAL KNEE     ROTATOR CUFF REPAIR Left 2007   acute open; accident   Family History  Problem Relation Age of Onset   CVA Mother    Hypertension Mother    Hypertension Sister        brain tumor   Breast cancer Neg Hx    Colon cancer Neg Hx    Social History   Socioeconomic History   Marital status: Married    Spouse name: Not on file   Number of children: 0   Years of education: 12   Highest education level: Not on file  Occupational History   Not on file  Tobacco Use   Smoking status: Former    Types: Cigarettes    Quit date: 09/10/2009    Years since quitting: 12.6   Smokeless tobacco: Never  Vaping Use   Vaping Use: Never used  Substance and Sexual Activity   Alcohol use: Yes    Alcohol/week: 1.0 standard drink of alcohol    Types: 1 Glasses of wine per week    Comment: wine occasional;  socially   Drug use: No   Sexual activity: Yes  Other Topics Concern   Not on file  Social History Narrative   Not on file   Social Determinants of Health   Financial Resource Strain: Low Risk  (07/17/2021)   Overall Financial Resource Strain (CARDIA)    Difficulty of Paying Living Expenses: Not hard at all  Food Insecurity: No Food Insecurity (07/17/2021)   Hunger Vital Sign    Worried About Running Out of Food in the Last Year: Never true    Ran Out of Food in the Last Year: Never true  Transportation Needs: No Transportation Needs (07/17/2021)   PRAPARE - Hydrologist (Medical): No    Lack of Transportation (Non-Medical): No  Physical Activity: Unknown (07/17/2021)   Exercise Vital Sign    Days of Exercise per Week: 0 days    Minutes of Exercise per Session: Not on file  Stress: No Stress Concern Present  (07/17/2021)   [place]    Feeling of Stress : Not at all  Social Connections: Unknown (07/17/2021)   Social Connection and Isolation Panel [NHANES]    Frequency of Communication with Friends and Family: More than three times a week    Frequency of Social Gatherings with Friends and Family: More than three times a week    Attends Religious Services: Not on Advertising copywriter or Organizations: Yes    Attends Archivist Meetings: More than 4 times per year    Marital Status: Married     Review of Systems  Constitutional:  Negative for appetite change and unexpected weight change.  HENT:  Negative for congestion and sinus pressure.   Respiratory:  Negative for cough, chest tightness and shortness of breath.   Cardiovascular:  Negative for chest pain, palpitations and leg swelling.  Gastrointestinal:  Negative for abdominal pain, diarrhea, nausea and vomiting.  Genitourinary:  Negative for difficulty urinating and dysuria.  Musculoskeletal:  Negative for myalgias.       Knee swelling as outlined.   Skin:  Negative for color change and rash.  Neurological:  Negative for dizziness, light-headedness and headaches.  Psychiatric/Behavioral:  Negative for agitation and dysphoric mood.        Objective:     BP 126/66 (BP Location: Left Arm, Patient Position: Sitting, Cuff Size: Normal)   Pulse 60   Temp 97.6 F (36.4 C) (Oral)   Ht 5' 6"  (1.676 m)   Wt 146 lb 9.6 oz (66.5 kg)   LMP 08/13/1995   SpO2 98%   BMI 23.66 kg/m  Wt Readings from Last 3 Encounters:  04/23/22 146 lb 9.6 oz (66.5 kg)  03/12/22 145 lb 6.4 oz (66 kg)  12/31/21 147 lb 3.2 oz (66.8 kg)    Physical Exam Vitals reviewed.  Constitutional:      General: She is not in acute distress.    Appearance: Normal appearance.  HENT:     Head: Normocephalic and atraumatic.     Right Ear: External ear normal.     Left Ear: External  ear normal.  Eyes:     General: No scleral icterus.       Right eye: No discharge.        Left eye: No discharge.     Conjunctiva/sclera: Conjunctivae normal.  Neck:     Thyroid: No thyromegaly.  Cardiovascular:     Rate and Rhythm:  Normal rate and regular rhythm.  Pulmonary:     Effort: No respiratory distress.     Breath sounds: Normal breath sounds. No wheezing.  Abdominal:     General: Bowel sounds are normal.     Palpations: Abdomen is soft.     Tenderness: There is no abdominal tenderness.  Musculoskeletal:        General: No swelling or tenderness.     Cervical back: Neck supple. No tenderness.  Lymphadenopathy:     Cervical: No cervical adenopathy.  Skin:    Findings: No erythema or rash.  Neurological:     Mental Status: She is alert.  Psychiatric:        Mood and Affect: Mood normal.        Behavior: Behavior normal.      Outpatient Encounter Medications as of 04/23/2022  Medication Sig   amLODipine (NORVASC) 10 MG tablet TAKE 1 TABLET EVERY DAY   ASPIRIN 81 PO Take by mouth daily.   atenolol (TENORMIN) 50 MG tablet TAKE 1 TABLET EVERY DAY   brimonidine (ALPHAGAN) 0.2 % ophthalmic solution 1 drop 2 (two) times daily.    clotrimazole-betamethasone (LOTRISONE) cream APPLY TO AFFECTED AREA TWICE A DAY   gabapentin (NEURONTIN) 100 MG capsule Take 2 capsules (200 mg total) by mouth at bedtime.   hydrochlorothiazide (MICROZIDE) 12.5 MG capsule Take 1 capsule (12.5 mg total) by mouth daily.   latanoprost (XALATAN) 0.005 % ophthalmic solution Place 1 drop into both eyes at bedtime.    lisinopril (ZESTRIL) 40 MG tablet TAKE 1 TABLET EVERY DAY   nystatin cream (MYCOSTATIN) Apply 1 application topically 2 (two) times daily.   rosuvastatin (CRESTOR) 10 MG tablet TAKE 1 TABLET EVERY DAY   sulfaSALAzine (AZULFIDINE) 500 MG tablet Take 1,500 mg by mouth 2 (two) times daily. 3 tabs   timolol (TIMOPTIC) 0.5 % ophthalmic solution Place 1 drop into both eyes 2 (two) times daily.    traMADol (ULTRAM) 50 MG tablet Take 1 tablet (50 mg total) by mouth 2 (two) times daily. Each refill must last 30 days.   [DISCONTINUED] ondansetron (ZOFRAN) 4 MG tablet Take 1 tablet (4 mg total) by mouth every 12 (twelve) hours as needed for nausea or vomiting. (Patient not taking: Reported on 04/23/2022)   No facility-administered encounter medications on file as of 04/23/2022.     Lab Results  Component Value Date   WBC 6.7 09/24/2021   HGB 12.5 09/24/2021   HCT 37.9 09/24/2021   PLT 165.0 09/24/2021   GLUCOSE 83 04/23/2022   CHOL 151 04/23/2022   TRIG 83.0 04/23/2022   HDL 70.30 04/23/2022   LDLDIRECT 132.5 05/06/2013   LDLCALC 64 04/23/2022   ALT 17 04/23/2022   AST 27 04/23/2022   NA 141 04/23/2022   K 4.0 04/23/2022   CL 104 04/23/2022   CREATININE 0.81 04/23/2022   BUN 18 04/23/2022   CO2 30 04/23/2022   TSH 3.56 09/24/2021   HGBA1C 5.7 10/19/2012    MM 3D SCREEN BREAST BILATERAL  Result Date: 12/25/2021 CLINICAL DATA:  Screening. EXAM: DIGITAL SCREENING BILATERAL MAMMOGRAM WITH TOMOSYNTHESIS AND CAD TECHNIQUE: Bilateral screening digital craniocaudal and mediolateral oblique mammograms were obtained. Bilateral screening digital breast tomosynthesis was performed. The images were evaluated with computer-aided detection. COMPARISON:  Previous exam(s). ACR Breast Density Category b: There are scattered areas of fibroglandular density. FINDINGS: There are no findings suspicious for malignancy. IMPRESSION: No mammographic evidence of malignancy. A result letter of this screening mammogram will be mailed  directly to the patient. RECOMMENDATION: Screening mammogram in one year. (Code:SM-B-01Y) BI-RADS CATEGORY  1: Negative. Electronically Signed   By: Evangeline Dakin M.D.   On: 12/25/2021 16:22       Assessment & Plan:   Problem List Items Addressed This Visit     Crohn's colitis ([place])    Stable.  Continue sulfasalazine.  Being followed by GI ([facility]).  Has f/u planned  06/2022.       Hypercholesterolemia    Continue crestor.  Follow lipid panel and liver function tests.       Hypertension    Continue lisinopril, amlodipine and atenolol.  Given persistent elevation, added hctz 12.68m q day last visit.  Pressures is better.  Continue current medication regimen.  Follow pressures.  Follow metabolic panel.       Vitamin B12 deficiency    Continue b12 injections.       Osteoarthritis of knee (Right) (Chronic)    Has had several surgeries on her knee.  Now being followed by pain clinic.  Pain stable.  Follow.       Other Visit Diagnoses     Need for immunization against influenza    -  Primary   Relevant Orders   Flu Vaccine QUAD High Dose(Fluad) (Completed)        CEinar Pheasant MD

## 2022-05-03 ENCOUNTER — Encounter: Payer: Self-pay | Admitting: Internal Medicine

## 2022-05-03 NOTE — Assessment & Plan Note (Signed)
Stable.  Continue sulfasalazine.  Being followed by GI ([facility]).  Has f/u planned 06/2022.

## 2022-05-03 NOTE — Assessment & Plan Note (Signed)
Has had several surgeries on her knee.  Now being followed by pain clinic.  Pain stable.  Follow.

## 2022-05-03 NOTE — Assessment & Plan Note (Signed)
Continue lisinopril, amlodipine and atenolol.  Given persistent elevation, added hctz 12.58m q day last visit.  Pressures is better.  Continue current medication regimen.  Follow pressures.  Follow metabolic panel.

## 2022-05-03 NOTE — Assessment & Plan Note (Signed)
Continue b12 injections.

## 2022-05-03 NOTE — Assessment & Plan Note (Signed)
Continue crestor.  Follow lipid panel and liver function tests.

## 2022-05-04 ENCOUNTER — Other Ambulatory Visit: Payer: Self-pay | Admitting: Internal Medicine

## 2022-05-04 ENCOUNTER — Other Ambulatory Visit: Payer: Self-pay | Admitting: Pain Medicine

## 2022-05-04 DIAGNOSIS — M792 Neuralgia and neuritis, unspecified: Secondary | ICD-10-CM

## 2022-05-04 DIAGNOSIS — G8929 Other chronic pain: Secondary | ICD-10-CM

## 2022-05-09 ENCOUNTER — Ambulatory Visit (INDEPENDENT_AMBULATORY_CARE_PROVIDER_SITE_OTHER): Payer: Medicare HMO

## 2022-05-09 DIAGNOSIS — E538 Deficiency of other specified B group vitamins: Secondary | ICD-10-CM | POA: Diagnosis not present

## 2022-05-09 MED ORDER — CYANOCOBALAMIN 1000 MCG/ML IJ SOLN
1000.0000 ug | Freq: Once | INTRAMUSCULAR | Status: AC
  Administered 2022-05-09: 1000 ug via INTRAMUSCULAR

## 2022-05-09 NOTE — Progress Notes (Signed)
Pt presented for her B12 injection. Pt was identified through two identifiers. Pt tolerated shot well in left deltoid.

## 2022-05-12 ENCOUNTER — Ambulatory Visit: Payer: Medicare HMO | Admitting: Pain Medicine

## 2022-05-13 ENCOUNTER — Ambulatory Visit: Payer: Medicare HMO | Attending: Pain Medicine | Admitting: Pain Medicine

## 2022-05-13 ENCOUNTER — Other Ambulatory Visit: Payer: Self-pay | Admitting: Pain Medicine

## 2022-05-13 ENCOUNTER — Encounter: Payer: Self-pay | Admitting: Pain Medicine

## 2022-05-13 VITALS — BP 131/65 | HR 57 | Temp 97.2°F | Resp 18 | Ht 66.0 in | Wt 142.0 lb

## 2022-05-13 DIAGNOSIS — Z79899 Other long term (current) drug therapy: Secondary | ICD-10-CM | POA: Insufficient documentation

## 2022-05-13 DIAGNOSIS — G8929 Other chronic pain: Secondary | ICD-10-CM

## 2022-05-13 DIAGNOSIS — M25561 Pain in right knee: Secondary | ICD-10-CM | POA: Diagnosis not present

## 2022-05-13 DIAGNOSIS — G894 Chronic pain syndrome: Secondary | ICD-10-CM | POA: Insufficient documentation

## 2022-05-13 DIAGNOSIS — Z79891 Long term (current) use of opiate analgesic: Secondary | ICD-10-CM | POA: Diagnosis not present

## 2022-05-13 DIAGNOSIS — M792 Neuralgia and neuritis, unspecified: Secondary | ICD-10-CM

## 2022-05-13 DIAGNOSIS — Z96651 Presence of right artificial knee joint: Secondary | ICD-10-CM | POA: Diagnosis not present

## 2022-05-13 MED ORDER — TRAMADOL HCL 50 MG PO TABS: 50.0000 mg | ORAL_TABLET | Freq: Two times a day (BID) | ORAL | 5 refills | Status: DC

## 2022-05-13 MED ORDER — PREDNISONE 20 MG PO TABS: ORAL_TABLET | ORAL | 0 refills | Status: AC

## 2022-05-13 NOTE — Progress Notes (Addendum)
PROVIDER NOTE: Information contained herein reflects review and annotations entered in association with encounter. Interpretation of such information and data should be left to medically-trained personnel. Information provided to patient can be located elsewhere in the medical record under "Patient Instructions". Document created using STT-dictation technology, any transcriptional errors that may result from process are unintentional.    Patient: Ellen Drake  Service Category: E/M  Provider:  A , MD  DOB: 03/14/1946  DOS: 05/13/2022  Referring Provider: Scott, Charlene, MD  MRN: [id number]  Specialty: Interventional Pain Management  PCP: Scott, Charlene, MD  Type: Established Patient  Setting: Ambulatory outpatient    Location: Office  Delivery: Face-to-face     HPI  Ellen Drake, a 76 y.o. year old female, is here today because of her Chronic pain syndrome [G89.4]. Ellen Drake's primary complain today is Knee Pain (right) Last encounter: My last encounter with her was on 05/04/2022. Pertinent problems: Ellen Drake has Arthritis; Chronic knee pain (1ry area of Pain) (Right); S/P revision of total knee (Right); Osteoarthritis of knee (Right); Right leg swelling; Shoulder pain, bilateral; Disorder of the skin and subcutaneous tissue, unspecified; Synovitis of knee; History of unicompartmental knee replacement (Right); Chronic pain syndrome; Chronic knee pain s/p TKR (Right); Pain and swelling of knee (Right); Neurogenic pain; and Left shoulder pain on their pertinent problem list. Pain Assessment: Severity of Chronic pain is reported as a 6 /10. Location: Knee Right/denies. Onset: More than a month ago. Quality: Stabbing. Timing: Constant. Modifying factor(s): nothing. Vitals:  height is 5' 6" (1.676 m) and weight is 142 lb (64.4 kg). Her temperature is 97.2 F (36.2 C) (abnormal). Her blood pressure is 131/65 and her pulse is 57 (abnormal). Her respiration is 18 and oxygen  saturation is 99%.   Reason for encounter: medication management.  The patient indicates doing well with the current medication regimen. No adverse reactions or side effects reported to the medications.   Routine UDS ordered today.   RTCB: 11/14/2022 Nonopioids transferred 05/08/2021: Gabapentin  Pharmacotherapy Assessment  Analgesic: Tramadol 50 mg 1 tab p.o. twice daily (100 mg/day of tramadol) (10 MME) MME/day: 10 mg/day   Monitoring: [place] PMP: PDMP reviewed during this encounter.       Pharmacotherapy: No side-effects or adverse reactions reported. Compliance: No problems identified. Effectiveness: Clinically acceptable.  Tice, Kori M, RN  05/13/2022  2:06 PM  Signed Nursing Pain Medication Assessment:  Safety precautions to be maintained throughout the outpatient stay will include: orient to surroundings, keep bed in low position, maintain call bell within reach at all times, provide assistance with transfer out of bed and ambulation.  Medication Inspection Compliance: Pill count conducted under aseptic conditions, in front of the patient. Neither the pills nor the bottle was removed from the patient's sight at any time. Once count was completed pills were immediately returned to the patient in their original bottle.  Medication: Tramadol (Ultram) Pill/Patch Count:  53 of 60 pills remain Pill/Patch Appearance: Markings consistent with prescribed medication Bottle Appearance: Standard pharmacy container. Clearly labeled. Filled Date: 09 / 28 / 2023 Last Medication intake:  Today    No results found for: "CBDTHCR" No results found for: "D8THCCBX" No results found for: "D9THCCBX"  UDS:  Summary  Date Value Ref Range Status  02/20/2021 Note  Final    Comment:    ==================================================================== ToxASSU[employer] Select 13 (MW) ==================================================================== Test                               Result       Flag        Units  Drug Present and Declared for Prescription Verification   Tramadol                       >3968        EXPECTED   ng/mg creat   O-Desmethyltramadol            >3968        EXPECTED   ng/mg creat   N-Desmethyltramadol            1874         EXPECTED   ng/mg creat    Source of tramadol is a prescription medication. O-desmethyltramadol    and N-desmethyltramadol are expected metabolites of tramadol.  ==================================================================== Test                      Result    Flag   Units      Ref Range   Creatinine              126              mg/dL      >=20 ==================================================================== Declared Medications:  The flagging and interpretation on this report are based on the  following declared medications.  Unexpected results may arise from  inaccuracies in the declared medications.   **Note: The testing scope of this panel includes these medications:   Tramadol (Ultram)   **Note: The testing scope of this panel does not include the  following reported medications:   Amlodipine  Aspirin  Atenolol  Betamethasone (Lotrisone)  Clotrimazole (Lotrisone)  Eye Drop  Gabapentin  Lisinopril  Nystatin  Rosuvastatin  Sulfasalazine ==================================================================== For clinical consultation, please call [phone]. ====================================================================       ROS  Constitutional: Denies any fever or chills Gastrointestinal: No reported hemesis, hematochezia, vomiting, or acute GI distress Musculoskeletal: Denies any acute onset joint swelling, redness, loss of ROM, or weakness Neurological: No reported episodes of acute onset apraxia, aphasia, dysarthria, agnosia, amnesia, paralysis, loss of coordination, or loss of consciousness  Medication Review  Aspirin, amLODipine, atenolol, brimonidine, clotrimazole-betamethasone, gabapentin,  hydrochlorothiazide, latanoprost, lisinopril, nystatin cream, predniSONE, rosuvastatin, sulfaSALAzine, timolol, and traMADol  History Review  Allergy: Ellen Drake is allergic to mercaptopurine, lyrica [pregabalin], and dilaudid [hydromorphone hcl]. Drug: Ellen Drake  reports no history of drug use. Alcohol:  reports current alcohol use of about 1.0 standard drink of alcohol per week. Tobacco:  reports that she quit smoking about 12 years ago. Her smoking use included cigarettes. She has never used smokeless tobacco. Social: Ellen Drake  reports that she quit smoking about 12 years ago. Her smoking use included cigarettes. She has never used smokeless tobacco. She reports current alcohol use of about 1.0 standard drink of alcohol per week. She reports that she does not use drugs. Medical:  has a past medical history of Anemia, Colon polyp (2009), Crohn's disease (HCC), Diverticulosis, Fibrocystic breast disease, Herniated nucleus pulposus of lumbosacral region, History of chicken pox, Hypercholesterolemia, Hyperlipidemia, Hypertension, Osteoarthritis, and Pulmonary nodule seen on imaging study (2004). Surgical: Ellen Drake  has a past surgical history that includes Back surgery (1992); anterior cervical discectomy and fusion (1998); Knee surgery (Right, 1/16); Colonoscopy (2009); Colon surgery (1998); Posterior laminectomy / decompression lumbar spine (1999); Rotator cuff repair (Left, 2007); Colonoscopy w/ biopsies (11/30/2012); Colectomy (2003); Replacement total knee; Knee arthroscopy w/ synovectomy (Right, 03/10/2014); Colonoscopy   w/ biopsies (04/15/2016); Cataract extraction w/PHACO (Right, 02/09/2019); and Breast biopsy (Left). Family: family history includes CVA in her mother; Hypertension in her mother and sister.  Laboratory Chemistry Profile   Renal Lab Results  Component Value Date   BUN 18 04/23/2022   CREATININE 0.81 04/23/2022   GFR 70.51 04/23/2022    Hepatic Lab Results  Component  Value Date   AST 27 04/23/2022   ALT 17 04/23/2022   ALBUMIN 4.1 04/23/2022   ALKPHOS 62 04/23/2022    Electrolytes Lab Results  Component Value Date   NA 141 04/23/2022   K 4.0 04/23/2022   CL 104 04/23/2022   CALCIUM 9.9 04/23/2022   MG 2.0 01/14/2021    Bone Lab Results  Component Value Date   VD25OH 8.33 (L) 01/14/2021    Inflammation (CRP: Acute Phase) (ESR: Chronic Phase) Lab Results  Component Value Date   CRP 3.0 (H) 01/14/2021   ESRSEDRATE 31 (H) 01/14/2021         Note: Above Lab results reviewed.  Recent Imaging Review  MM 3D SCREEN BREAST BILATERAL CLINICAL DATA:  Screening.  EXAM: DIGITAL SCREENING BILATERAL MAMMOGRAM WITH TOMOSYNTHESIS AND CAD  TECHNIQUE: Bilateral screening digital craniocaudal and mediolateral oblique mammograms were obtained. Bilateral screening digital breast tomosynthesis was performed. The images were evaluated with computer-aided detection.  COMPARISON:  Previous exam(s).  ACR Breast Density Category b: There are scattered areas of fibroglandular density.  FINDINGS: There are no findings suspicious for malignancy.  IMPRESSION: No mammographic evidence of malignancy. A result letter of this screening mammogram will be mailed directly to the patient.  RECOMMENDATION: Screening mammogram in one year. (Code:SM-B-01Y)  BI-RADS CATEGORY  1: Negative.  Electronically Signed   By: Thomas  Lawrence M.D.   On: 12/25/2021 16:22 Note: Reviewed        Physical Exam  General appearance: Well nourished, well developed, and well hydrated. In no apparent acute distress Mental status: Alert, oriented x 3 (person, place, & time)       Respiratory: No evidence of acute respiratory distress Eyes: PERLA Vitals: BP 131/65   Pulse (!) 57   Temp (!) 97.2 F (36.2 C)   Resp 18   Ht 5' 6" (1.676 m)   Wt 142 lb (64.4 kg)   LMP 08/13/1995   SpO2 99%   BMI 22.92 kg/m  BMI: Estimated body mass index is 22.92 kg/m as calculated  from the following:   Height as of this encounter: 5' 6" (1.676 m).   Weight as of this encounter: 142 lb (64.4 kg). Ideal: Ideal body weight: 59.3 kg (130 lb 11.7 oz) Adjusted ideal body weight: 61.3 kg (135 lb 3.8 oz)  Assessment   Diagnosis Status  1. Chronic pain syndrome   2. 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 Chronic knee pain (1ry area of Pain) (Right)   4. Chronic knee pain s/p TKR (Right)   5. Pharmacologic therapy   6. Chronic use of opiate for therapeutic purpose   7. Encounter for medication management   8. Encounter for chronic pain management    Controlled Controlled Controlled   Updated Problems: Problem  Left Shoulder Pain   Saw [facility] 12/23/21 - s/p injection.    Cough     Plan of Care  Problem-specific:  No problem-specific Assessment & Plan notes found for this encounter.  Ellen Drake has a current medication list which includes the following long-term medication(s): amlodipine, atenolol, gabapentin, hydrochlorothiazide,   lisinopril, rosuvastatin, sulfasalazine, and [START ON 05/18/2022] tramadol.  Pharmacotherapy (Medications Ordered): Meds ordered this encounter  Medications   traMADol (ULTRAM) 50 MG tablet    Sig: Take 1 tablet (50 mg total) by mouth 2 (two) times daily. Each refill must last 30 days.    Dispense:  60 tablet    Refill:  5    DO NOT: delete (not duplicate); no partial-fill (will deny script to complete), no refill request (F/U required). DISPENSE: 1 day early if closed on fill date. WARN: No CNS-depressants within 8 hrs of med.   predniSONE (DELTASONE) 20 MG tablet    Sig: Take 3 tablets (60 mg total) by mouth daily with breakfast for 3 days, THEN 2 tablets (40 mg total) daily with breakfast for 3 days, THEN 1 tablet (20 mg total) daily with breakfast for 3 days.    Dispense:  18 tablet    Refill:  0   Orders:  Orders Placed This Encounter  Procedures   ToxASSURE Select 13 (MW), Urine     Volume: 30 ml(s). Minimum 3 ml of urine is needed. Document temperature of fresh sample. Indications: Long term (current) use of opiate analgesic (Z79.891)    Order Specific Question:   Release to patient    Answer:   Immediate   Follow-up plan:   No follow-ups on file.     Interventional Therapies  Risk  Complexity Considerations:   Estimated body mass index is 22.6 kg/m as calculated from the following:   Height as of this encounter: 5' 6" (1.676 m).   Weight as of this encounter: 140 lb (63.5 kg). Allergy: Lyrica, Dilaudid Note: Does not want SCS    Planned  Pending:      Under consideration:   Diagnostic right Lumbar spinal cord stimulator trial (Does not want.)   Completed:   Diagnostic right genicular NB x1 (01/17/2021) (100/100/0/0)  Therapeutic right genicular nerve RFA x1 (03/07/2021) (100/100/100/90)    Therapeutic  Palliative (PRN) options:   Palliative right genicular nerve RFA     Recent Visits No visits were found meeting these conditions. Showing recent visits within past 90 days and meeting all other requirements Today's Visits Date Type Provider Dept  05/13/22 Office Visit , , MD [facility]  Showing today's visits and meeting all other requirements Future Appointments No visits were found meeting these conditions. Showing future appointments within next 90 days and meeting all other requirements  I discussed the assessment and treatment plan with the patient. The patient was provided an opportunity to ask questions and all were answered. The patient agreed with the plan and demonstrated an understanding of the instructions.  Patient advised to call back or seek an in-person evaluation if the symptoms or condition worsens.  Duration of encounter: 30 minutes.  Total time on encounter, as per AMA guidelines included both the face-to-face and non-face-to-face time personally spent by the physician and/or other qualified health  care professional(s) on the day of the encounter (includes time in activities that require the physician or other qualified health care professional and does not include time in activities normally performed by clinical staff). Physician's time may include the following activities when performed: preparing to see the patient (eg, review of tests, pre-charting review of records) obtaining and/or reviewing separately obtained history performing a medically appropriate examination and/or evaluation counseling and educating the patient/family/caregiver ordering medications, tests, or procedures referring and communicating with other health care professionals (when not separately reported) documenting clinical information in   the electronic or other health record independently interpreting results (not separately reported) and communicating results to the patient/ family/caregiver care coordination (not separately reported)  Note by: Gaspar Cola, MD Date: 05/13/2022; Time: 2:12 PM

## 2022-05-13 NOTE — Addendum Note (Signed)
Addended by: Milinda Pointer A on: 05/13/2022 02:12 PM   Modules accepted: Orders

## 2022-05-13 NOTE — Progress Notes (Signed)
Nursing Pain Medication Assessment:  Safety precautions to be maintained throughout the outpatient stay will include: orient to surroundings, keep bed in low position, maintain call bell within reach at all times, provide assistance with transfer out of bed and ambulation.  Medication Inspection Compliance: Pill count conducted under aseptic conditions, in front of the patient. Neither the pills nor the bottle was removed from the patient's sight at any time. Once count was completed pills were immediately returned to the patient in their original bottle.  Medication: Tramadol (Ultram) Pill/Patch Count:  53 of 60 pills remain Pill/Patch Appearance: Markings consistent with prescribed medication Bottle Appearance: Standard pharmacy container. Clearly labeled. Filled Date: 09 / 28 / 2023 Last Medication intake:  Today

## 2022-05-15 DIAGNOSIS — H401133 Primary open-angle glaucoma, bilateral, severe stage: Secondary | ICD-10-CM | POA: Diagnosis not present

## 2022-05-15 LAB — TOXASSURE SELECT 13 (MW), URINE

## 2022-05-20 ENCOUNTER — Telehealth: Payer: Self-pay | Admitting: Pain Medicine

## 2022-05-20 NOTE — Telephone Encounter (Signed)
Patient says pharmacy told her they do not have a script for tramadol. Also her gabapentin did not get put in at her appt. Last week. Can this be sent in as well.

## 2022-05-21 MED ORDER — GABAPENTIN 100 MG PO CAPS: ORAL_CAPSULE | ORAL | 2 refills | Status: DC

## 2022-05-21 NOTE — Telephone Encounter (Signed)
Rx sent in for gabapentin 159m q am and q hs.  Please notify pt.

## 2022-05-21 NOTE — Addendum Note (Signed)
Addended by: Alisa Graff on: 05/21/2022 04:38 PM   Modules accepted: Orders

## 2022-05-21 NOTE — Telephone Encounter (Signed)
Patient is taking 100 mg at bedtime & one 100 mg in the the morning. I will send in if okay?

## 2022-05-21 NOTE — Telephone Encounter (Signed)
Dr Dossie Arbour will not refill her gabapentin (NEURONTIN) 100 MG capsule (Expired) either, will Dr Nicki Reaper refill?

## 2022-05-21 NOTE — Telephone Encounter (Signed)
I am ok to refill.  Please clarify exactly how she is taking - is 131m (2 q hs correct).  What pharmacy is preferred.

## 2022-05-21 NOTE — Telephone Encounter (Signed)
Pt called and notified that prescription was sent.

## 2022-05-21 NOTE — Telephone Encounter (Signed)
Instructed patient that Dr. Dossie Arbour no longer prescribes non-opioids. Instructed her to call PCP for that.  Informed her that Tramadol is at the pharmacy, states she has already picked it up.

## 2022-05-27 ENCOUNTER — Telehealth: Payer: Self-pay | Admitting: Pain Medicine

## 2022-05-27 NOTE — Telephone Encounter (Signed)
PT stated that her right knee is swollen, and patient is in a lot of pain.Pt stated that she is out of the medication that doctor had prescribed for her. PT wants to know if doctor can prescribed her something else for pain. Please give patient a call. Thanks

## 2022-05-27 NOTE — Telephone Encounter (Signed)
Advised patient to schedule appt to discuss with Dr. Dossie Arbour.

## 2022-05-28 ENCOUNTER — Ambulatory Visit: Payer: Medicare HMO | Attending: Pain Medicine | Admitting: Pain Medicine

## 2022-05-28 ENCOUNTER — Encounter: Payer: Self-pay | Admitting: Pain Medicine

## 2022-05-28 VITALS — BP 135/99 | HR 59 | Temp 97.0°F | Resp 16 | Ht 66.0 in | Wt 142.0 lb

## 2022-05-28 DIAGNOSIS — M25461 Effusion, right knee: Secondary | ICD-10-CM | POA: Diagnosis not present

## 2022-05-28 DIAGNOSIS — G8929 Other chronic pain: Secondary | ICD-10-CM | POA: Insufficient documentation

## 2022-05-28 DIAGNOSIS — Z96651 Presence of right artificial knee joint: Secondary | ICD-10-CM | POA: Insufficient documentation

## 2022-05-28 DIAGNOSIS — M25561 Pain in right knee: Secondary | ICD-10-CM | POA: Insufficient documentation

## 2022-05-28 NOTE — Patient Instructions (Addendum)
______________________________________________________________________  Preparing for Procedure with Sedation  NOTICE: Due to recent regulatory changes, starting on March 11, 2021, procedures requiring intravenous (IV) sedation will no longer be performed at the [facility].  These types of procedures are required to be performed at [facility] ambulatory surgery facility.  We are very sorry for the inconvenience.  Procedure appointments are limited to planned procedures: No Prescription Refills. No disability issues will be discussed. No medication changes will be discussed.  Instructions: Oral Intake: Do not eat or drink anything for at least 8 hours prior to your procedure. (Exception: Blood Pressure Medication. See below.) Transportation: A driver is required. You may not drive yourself after the procedure. Blood Pressure Medicine: Do not forget to take your blood pressure medicine with a sip of water the morning of the procedure. If your Diastolic (lower reading) is above 100 mmHg, elective cases will be cancelled/rescheduled. Blood thinners: These will need to be stopped for procedures. Notify our staff if you are taking any blood thinners. Depending on which one you take, there will be specific instructions on how and when to stop it. Diabetics on insulin: Notify the staff so that you can be scheduled 1st case in the morning. If your diabetes requires high dose insulin, take only  of your normal insulin dose the morning of the procedure and notify the staff that you have done so. Preventing infections: Shower with an antibacterial soap the morning of your procedure. Build-up your immune system: Take 1000 mg of Vitamin C with every meal (3 times a day) the day prior to your procedure. Antibiotics: Inform the staff if you have a condition or reason that requires you to take antibiotics before dental procedures. Pregnancy: If you are pregnant, call and cancel the procedure. Sickness: If  you have a cold, fever, or any active infections, call and cancel the procedure. Arrival: You must be in the facility at least 30 minutes prior to your scheduled procedure. Children: Do not bring children with you. Dress appropriately: There is always the possibility that your clothing may get soiled. Valuables: Do not bring any jewelry or valuables.  Reasons to call and reschedule or cancel your procedure: (Following these recommendations will minimize the risk of a serious complication.) Surgeries: Avoid having procedures within 2 weeks of any surgery. (Avoid for 2 weeks before or after any surgery). Flu Shots: Avoid having procedures within 2 weeks of a flu shots. (Avoid for 2 weeks before or after immunizations). Barium: Avoid having a procedure within 7-10 days after having had a radiological study involving the use of radiological contrast. (Myelograms, Barium swallow or enema study). Heart attacks: Avoid any elective procedures or surgeries for the initial 6 months after a "Myocardial Infarction" (Heart Attack). Blood thinners: It is imperative that you stop these medications before procedures. Let us know if you if you take any blood thinner.  Infection: Avoid procedures during or within two weeks of an infection (including chest colds or gastrointestinal problems). Symptoms associated with infections include: Localized redness, fever, chills, night sweats or profuse sweating, burning sensation when voiding, cough, congestion, stuffiness, runny nose, sore throat, diarrhea, nausea, vomiting, cold or Flu symptoms, recent or current infections. It is specially important if the infection is over the area that we intend to treat. Heart and lung problems: Symptoms that may suggest an active cardiopulmonary problem include: cough, chest pain, breathing difficulties or shortness of breath, dizziness, ankle swelling, uncontrolled high or unusually low blood pressure, and/or palpitations. If you are  experiencing any of these symptoms, cancel your procedure and contact your primary care physician for an evaluation.  Remember:  Regular Business hours are:  Monday to Thursday 8:00 AM to 4:00 PM  Provider's Schedule: Milinda Pointer, MD:  Procedure days: Tuesday and Thursday 7:30 AM to 4:00 PM  Gillis Santa, MD:  Procedure days: Monday and Wednesday 7:30 AM to 4:00 PM ______________________________________________________________________  ____________________________________________________________________________________________  General Risks and Possible Complications  Patient Responsibilities: It is important that you read this as it is part of your informed consent. It is our duty to inform you of the risks and possible complications associated with treatments offered to you. It is your responsibility as a patient to read this and to ask questions about anything that is not clear or that you believe was not covered in this document.  Patient's Rights: You have the right to refuse treatment. You also have the right to change your mind, even after initially having agreed to have the treatment done. However, under this last option, if you wait until the last second to change your mind, you may be charged for the materials used up to that point.  Introduction: Medicine is not an Chief Strategy Officer. Everything in Medicine, including the lack of treatment(s), carries the potential for danger, harm, or loss (which is by definition: Risk). In Medicine, a complication is a secondary problem, condition, or disease that can aggravate an already existing one. All treatments carry the risk of possible complications. The fact that a side effects or complications occurs, does not imply that the treatment was conducted incorrectly. It must be clearly understood that these can happen even when everything is done following the highest safety standards.  No treatment: You can choose not to proceed with the  proposed treatment alternative. The "PRO(s)" would include: avoiding the risk of complications associated with the therapy. The "CON(s)" would include: not getting any of the treatment benefits. These benefits fall under one of three categories: diagnostic; therapeutic; and/or palliative. Diagnostic benefits include: getting information which can ultimately lead to improvement of the disease or symptom(s). Therapeutic benefits are those associated with the successful treatment of the disease. Finally, palliative benefits are those related to the decrease of the primary symptoms, without necessarily curing the condition (example: decreasing the pain from a flare-up of a chronic condition, such as incurable terminal cancer).  General Risks and Complications: These are associated to most interventional treatments. They can occur alone, or in combination. They fall under one of the following six (6) categories: no benefit or worsening of symptoms; bleeding; infection; nerve damage; allergic reactions; and/or death. No benefits or worsening of symptoms: In Medicine there are no guarantees, only probabilities. No healthcare provider can ever guarantee that a medical treatment will work, they can only state the probability that it may. Furthermore, there is always the possibility that the condition may worsen, either directly, or indirectly, as a consequence of the treatment. Bleeding: This is more common if the patient is taking a blood thinner, either prescription or over the counter (example: Goody Powders, Fish oil, Aspirin, Garlic, etc.), or if suffering a condition associated with impaired coagulation (example: Hemophilia, cirrhosis of the liver, low platelet counts, etc.). However, even if you do not have one on these, it can still happen. If you have any of these conditions, or take one of these drugs, make sure to notify your treating physician. Infection: This is more common in patients with a compromised  immune system, either due to disease (example:  diabetes, cancer, human immunodeficiency virus [HIV], etc.), or due to medications or treatments (example: therapies used to treat cancer and rheumatological diseases). However, even if you do not have one on these, it can still happen. If you have any of these conditions, or take one of these drugs, make sure to notify your treating physician. Nerve Damage: This is more common when the treatment is an invasive one, but it can also happen with the use of medications, such as those used in the treatment of cancer. The damage can occur to small secondary nerves, or to large primary ones, such as those in the spinal cord and brain. This damage may be temporary or permanent and it may lead to impairments that can range from temporary numbness to permanent paralysis and/or brain death. Allergic Reactions: Any time a substance or material comes in contact with our body, there is the possibility of an allergic reaction. These can range from a mild skin rash (contact dermatitis) to a severe systemic reaction (anaphylactic reaction), which can result in death. Death: In general, any medical intervention can result in death, most of the time due to an unforeseen complication. ____________________________________________________________________________________________ Radiofrequency Ablation Radiofrequency ablation is a procedure that is performed to relieve pain. The procedure is often used for back, neck, or arm pain. Radiofrequency ablation involves the use of a machine that creates radio waves to make heat. During the procedure, the heat is applied to the nerve that carries the pain signal. The heat damages the nerve and interferes with the pain signal. Pain relief usually starts about 2 weeks after the procedure and lasts for 6 months to 1 year. Tell a health care provider about: Any allergies you have. All medicines you are taking, including vitamins, herbs, eye  drops, creams, and over-the-counter medicines. Any problems you or family members have had with anesthetic medicines. Any bleeding problems you have. Any surgeries you have had. Any medical conditions you have. Whether you are pregnant or may be pregnant. What are the risks? Generally, this is a safe procedure. However, problems may occur, including: Pain or soreness at the injection site. Allergic reaction to medicines given during the procedure. Bleeding. Infection at the injection site. Damage to nerves or blood vessels. What happens before the procedure? When to stop eating and drinking Follow instructions from your health care provider about what you may eat and drink before your procedure. These may include: 8 hours before the procedure Stop eating most foods. Do not eat meat, fried foods, or fatty foods. Eat only light foods, such as toast or crackers. All liquids are okay except energy drinks and alcohol. 6 hours before the procedure Stop eating. Drink only clear liquids, such as water, clear fruit juice, black coffee, plain tea, and sports drinks. Do not drink energy drinks or alcohol. 2 hours before the procedure Stop drinking all liquids. You may be allowed to take medicine with small sips of water. If you do not follow your health care provider's instructions, your procedure may be delayed or canceled. Medicines Ask your health care provider about: Changing or stopping your regular medicines. This is especially important if you are taking diabetes medicines or blood thinners. Taking medicines such as aspirin and ibuprofen. These medicines can thin your blood. Do not take these medicines unless your health care provider tells you to take them. Taking over-the-counter medicines, vitamins, herbs, and supplements. General instructions Ask your health care provider what steps will be taken to help prevent infection. These steps may include:  Removing hair at the procedure  site. Washing skin with a germ-killing soap. Taking antibiotic medicine. If you will be going home right after the procedure, plan to have a responsible adult: Take you home from the hospital or clinic. You will not be allowed to drive. Care for you for the time you are told. What happens during the procedure?  You will be awake during the procedure. You will need to be able to talk with the health care provider during the procedure. An IV will be inserted into one of your veins. You will be given one or more of the following: A medicine to help you relax (sedative). A medicine to numb the area (local anesthetic). Your health care provider will insert a radiofrequency needle into the area to be treated. This is done with the help of fluoroscopy. A wire that carries the radio waves (electrode) will be put through the radiofrequency needle. An electrical pulse will be sent through the electrode to verify the correct nerve that is causing your pain. You will feel a tingling sensation, and you may have muscle twitching. The tissue around the needle tip will be heated by an electric current that comes from the radiofrequency machine. This will numb the nerves. The needle will be removed. A bandage (dressing) will be put on the insertion area. The procedure may vary among health care providers and hospitals. What happens after the procedure? Your blood pressure, heart rate, breathing rate, and blood oxygen level will be monitored until you leave the hospital or clinic. Return to your normal activities as told by your health care provider. Ask your health care provider what activities are safe for you. If you were given a sedative during the procedure, it can affect you for several hours. Do not drive or operate machinery until your health care provider says that it is safe. Summary Radiofrequency ablation is a procedure that is performed to relieve pain. The procedure is often used for back, neck,  or arm pain. Radiofrequency ablation involves the use of a machine that creates radio waves to make heat. Plan to have a responsible adult take you home from the hospital or clinic. Do not drive or operate machinery until your health care provider says that it is safe. Return to your normal activities as told by your health care provider. Ask your health care provider what activities are safe for you. This information is not intended to replace advice given to you by your health care provider. Make sure you discuss any questions you have with your health care provider. Document Revised: 01/15/2021 Document Reviewed: 01/15/2021 [employer] Patient Education  [place].

## 2022-05-28 NOTE — Progress Notes (Signed)
Safety precautions to be maintained throughout the outpatient stay will include: orient to surroundings, keep bed in low position, maintain call bell within reach at all times, provide assistance with transfer out of bed and ambulation.  

## 2022-05-28 NOTE — Progress Notes (Signed)
PROVIDER NOTE: Information contained herein reflects review and annotations entered in association with encounter. Interpretation of such information and data should be left to medically-trained personnel. Information provided to patient can be located elsewhere in the medical record under "Patient Instructions". Document created using STT-dictation technology, any transcriptional errors that may result from process are unintentional.    Patient: Ellen Drake  Service Category: E/M  Provider: Gaspar Cola, MD  DOB: 1945-09-09  DOS: 05/28/2022  Referring Provider: Einar Pheasant, MD  MRN: [id number]  Specialty: Interventional Pain Management  PCP: Einar Pheasant, MD  Type: Established Patient  Setting: Ambulatory outpatient    Location: Office  Delivery: Face-to-face     HPI  Ellen Drake, a 76 y.o. year old female, is here today because of her Chronic pain of right knee [M25.561, G89.29]. Ellen Drake primary complain today is Knee Pain Last encounter: My last encounter with her was on 05/27/2022. Pertinent problems: Ellen Drake has Arthritis; Chronic knee pain (1ry area of Pain) (Right); S/P revision of total knee (Right); Osteoarthritis of knee (Right); Right leg swelling; Shoulder pain, bilateral; Disorder of the skin and subcutaneous tissue, unspecified; Synovitis of knee; History of unicompartmental knee replacement (Right); Chronic pain syndrome; Chronic knee pain s/p TKR (Right); Pain and swelling of knee (Right); Neurogenic pain; and Left shoulder pain on their pertinent problem list. Pain Assessment: Severity of Chronic pain is reported as a 8 /10. Location: Knee Right/denies. Onset: More than a month ago. Quality: Aching, Constant, Sore, Pins and needles, Throbbing. Timing: Constant. Modifying factor(s): Predisone taper. Vitals:  height is _0  (1.676 m) and weight is 142 lb (64.4 kg). Her temperature is 97 F (36.1 C) (abnormal). Her blood pressure is 135/99 (abnormal)  and her pulse is 59 (abnormal). Her respiration is 16 and oxygen saturation is 97%.   Reason for encounter: evaluation of worsening, or previously known (established) problem.  The patient completed the prednisone taper but she is still having pain in the right knee.  According to our records the patient had a total knee replacement on the right side as well as a revision.  Reviewing her chart, I have that there is a documentation from 04/11/2014 where she was seen by Dr. Vilinda Flake [facility]) with a primary diagnosis of synovitis of the knee status post right partial knee replacement.  Today I have also reviewed an MRI of the right knee done on 01/24/2021 which showed mild intramuscular edema involving the popliteus and proximal soleus muscle.  It also showed a small joint effusion.  The MRI was interpreted as possible mild muscle strain.  Toradol/Norflex 30/30 60/60 IM injection today.  RTCB: 11/14/2022 Nonopioids transferred 05/08/2021: Gabapentin  Pharmacotherapy Assessment  Analgesic: Tramadol 50 mg 1 tab p.o. twice daily (100 mg/day of tramadol) (10 MME) MME/day: 10 mg/day   Monitoring:  PMP: PDMP reviewed during this encounter.       Pharmacotherapy: No side-effects or adverse reactions reported. Compliance: No problems identified. Effectiveness: Clinically acceptable.  Ignatius Specking, RN  05/28/2022 11:03 AM  Sign when Signing Visit Safety precautions to be maintained throughout the outpatient stay will include: orient to surroundings, keep bed in low position, maintain call bell within reach at all times, provide assistance with transfer out of bed and ambulation.     No results found for: "CBDTHCR" No results found for: "D8THCCBX" No results found for: "D9THCCBX"  UDS:  Summary  Date Value Ref Range Status  05/13/2022 Note  Final  Comment:    ==================================================================== [employer] Select 13  (MW) ==================================================================== Test                             Result       Flag       Units  Drug Present and Declared for Prescription Verification   Tramadol                       >3226        EXPECTED   ng/mg creat   O-Desmethyltramadol            >3226        EXPECTED   ng/mg creat   N-Desmethyltramadol            1348         EXPECTED   ng/mg creat    Source of tramadol is a prescription medication. O-desmethyltramadol    and N-desmethyltramadol are expected metabolites of tramadol.  ==================================================================== Test                      Result    Flag   Units      Ref Range   Creatinine              155              mg/dL      >=20 ==================================================================== Declared Medications:  The flagging and interpretation on this report are based on the  following declared medications.  Unexpected results may arise from  inaccuracies in the declared medications.   **Note: The testing scope of this panel includes these medications:   Tramadol (Ultram)   **Note: The testing scope of this panel does not include the  following reported medications:   Amlodipine (Norvasc)  Aspirin  Atenolol (Tenormin)  Betamethasone (Lotrisone)  Clotrimazole (Lotrisone)  Eye Drop  Gabapentin (Neurontin)  Hydrochlorothiazide (Microzide)  Lisinopril (Zestril)  Nystatin  Prednisone (Deltasone)  Rosuvastatin (Crestor)  Sulfasalazine (Azulfidine) ==================================================================== For clinical consultation, please call [phone]. ====================================================================       ROS  Constitutional: Denies any fever or chills Gastrointestinal: No reported hemesis, hematochezia, vomiting, or acute GI distress Musculoskeletal: Denies any acute onset joint swelling, redness, loss of ROM, or  weakness Neurological: No reported episodes of acute onset apraxia, aphasia, dysarthria, agnosia, amnesia, paralysis, loss of coordination, or loss of consciousness  Medication Review  Aspirin, amLODipine, atenolol, brimonidine, clotrimazole-betamethasone, gabapentin, hydrochlorothiazide, latanoprost, lisinopril, nystatin cream, rosuvastatin, sulfaSALAzine, timolol, and traMADol  History Review  Allergy: Ellen Drake is allergic to mercaptopurine, lyrica [pregabalin], and dilaudid [hydromorphone hcl]. Drug: Ellen Drake  reports no history of drug use. Alcohol:  reports current alcohol use of about 1.0 standard drink of alcohol per week. Tobacco:  reports that she quit smoking about 12 years ago. Her smoking use included cigarettes. She has never used smokeless tobacco. Social: Ellen Drake  reports that she quit smoking about 12 years ago. Her smoking use included cigarettes. She has never used smokeless tobacco. She reports current alcohol use of about 1.0 standard drink of alcohol per week. She reports that she does not use drugs. Medical:  has a past medical history of Anemia, Colon polyp (2009), Crohn's disease ([place]), Diverticulosis, Fibrocystic breast disease, Herniated nucleus pulposus of lumbosacral region, History of chicken pox, Hypercholesterolemia, Hyperlipidemia, Hypertension, Osteoarthritis, and Pulmonary nodule seen on imaging study (2004). Surgical: Ellen Drake  has a past  surgical history that includes Back surgery (1992); anterior cervical discectomy and fusion (1998); Knee surgery (Right, 1/16); Colonoscopy (2009); Colon surgery (1998); Posterior laminectomy / decompression lumbar spine (1999); Rotator cuff repair (Left, 2007); Colonoscopy w/ biopsies (11/30/2012); Colectomy (2003); Replacement total knee; Knee arthroscopy w/ synovectomy (Right, 03/10/2014); Colonoscopy w/ biopsies (04/15/2016); Cataract extraction w/PHACO (Right, 02/09/2019); and Breast biopsy (Left). Family: family history  includes CVA in her mother; Hypertension in her mother and sister.  Laboratory Chemistry Profile   Renal Lab Results  Component Value Date   BUN 18 04/23/2022   CREATININE 0.81 04/23/2022   GFR 70.51 04/23/2022    Hepatic Lab Results  Component Value Date   AST 27 04/23/2022   ALT 17 04/23/2022   ALBUMIN 4.1 04/23/2022   ALKPHOS 62 04/23/2022    Electrolytes Lab Results  Component Value Date   NA 141 04/23/2022   K 4.0 04/23/2022   CL 104 04/23/2022   CALCIUM 9.9 04/23/2022   MG 2.0 01/14/2021    Bone Lab Results  Component Value Date   VD25OH 8.33 (L) 01/14/2021    Inflammation (CRP: Acute Phase) (ESR: Chronic Phase) Lab Results  Component Value Date   CRP 3.0 (H) 01/14/2021   ESRSEDRATE 31 (H) 01/14/2021         Note: Above Lab results reviewed.  Recent Imaging Review  MM 3D SCREEN BREAST BILATERAL CLINICAL DATA:  Screening.  EXAM: DIGITAL SCREENING BILATERAL MAMMOGRAM WITH TOMOSYNTHESIS AND CAD  TECHNIQUE: Bilateral screening digital craniocaudal and mediolateral oblique mammograms were obtained. Bilateral screening digital breast tomosynthesis was performed. The images were evaluated with computer-aided detection.  COMPARISON:  Previous exam(s).  ACR Breast Density Category b: There are scattered areas of fibroglandular density.  FINDINGS: There are no findings suspicious for malignancy.  IMPRESSION: No mammographic evidence of malignancy. A result letter of this screening mammogram will be mailed directly to the patient.  RECOMMENDATION: Screening mammogram in one year. (Code:SM-B-01Y)  BI-RADS CATEGORY  1: Negative.  Electronically Signed   By: Evangeline Dakin M.D.   On: 12/25/2021 16:22 Note: Reviewed        Physical Exam  General appearance: Well nourished, well developed, and well hydrated. In no apparent acute distress Mental status: Alert, oriented x 3 (person, place, & time)       Respiratory: No evidence of acute respiratory  distress Eyes: PERLA Vitals: BP (!) 135/99   Pulse (!) 59   Temp (!) 97 F (36.1 C)   Resp 16   Ht _0  (1.676 m)   Wt 142 lb (64.4 kg)   LMP 08/13/1995   SpO2 97%   BMI 22.92 kg/m  BMI: Estimated body mass index is 22.92 kg/m as calculated from the following:   Height as of this encounter: _1  (1.676 m).   Weight as of this encounter: 142 lb (64.4 kg). Ideal: Ideal body weight: 59.3 kg (130 lb 11.7 oz) Adjusted ideal body weight: 61.3 kg (135 lb 3.8 oz)  Assessment   Diagnosis Status  1. Chronic knee pain (1ry area of Pain) (Right)   2. Chronic knee pain s/p TKR (Right)   3. Pain and swelling of knee (Right)   4. S/P revision of total knee (Right)    Controlled Controlled Controlled   Updated Problems: No problems updated.   Plan of Care  Problem-specific:  No problem-specific Assessment & Plan notes found for this encounter.  Ellen Drake has a current medication list which includes the following long-term medication(s):  amlodipine, atenolol, gabapentin, hydrochlorothiazide, lisinopril, rosuvastatin, sulfasalazine, and tramadol.  Pharmacotherapy (Medications Ordered): No orders of the defined types were placed in this encounter.  Orders:  Orders Placed This Encounter  Procedures   Radiofrequency,Genicular    Standing Status:   Future    Standing Expiration Date:   08/28/2022    Scheduling Instructions:     Side(s): Right Knee     Level(s): Superior-Lateral, Superior-Medial, and Inferior-Medial Genicular Nerve(s)     Sedation: With Sedation.     Scheduling Timeframe: As soon as pre-approved    Order Specific Question:   Where will this procedure be performed?    Answer:   [facility]   Follow-up plan:   Return for procedure (ECT): (R) Genicular Nerve RFA #2.     Interventional Therapies  Risk  Complexity Considerations:   Estimated body mass index is 22.6 kg/m as calculated from the following:   Height as of this encounter: _0   (1.676 m).   Weight as of this encounter: 140 lb (63.5 kg). Allergy: Lyrica, Dilaudid Note: Does not want SCS    Planned  Pending:   Therapeutic right genicular nerve RFA #2    Under consideration:   Diagnostic right Lumbar spinal cord stimulator trial (Does not want.)   Completed:   Diagnostic right genicular NB x1 (01/17/2021) (100/100/0/0)  Therapeutic right genicular nerve RFA x1 (03/07/2021) (100/100/100/90)    Therapeutic  Palliative (PRN) options:   Palliative right genicular nerve RFA      Recent Visits Date Type Provider Dept  05/13/22 Office Visit Milinda Pointer, MD [facility]  Showing recent visits within past 90 days and meeting all other requirements Today's Visits Date Type Provider Dept  05/28/22 Office Visit Milinda Pointer, MD [facility]  Showing today's visits and meeting all other requirements Future Appointments No visits were found meeting these conditions. Showing future appointments within next 90 days and meeting all other requirements  I discussed the assessment and treatment plan with the patient. The patient was provided an opportunity to ask questions and all were answered. The patient agreed with the plan and demonstrated an understanding of the instructions.  Patient advised to call back or seek an in-person evaluation if the symptoms or condition worsens.  Duration of encounter: 30 minutes.  Total time on encounter, as per AMA guidelines included both the face-to-face and non-face-to-face time personally spent by the physician and/or other qualified health care professional(s) on the day of the encounter (includes time in activities that require the physician or other qualified health care professional and does not include time in activities normally performed by clinical staff). Physician's time may include the following activities when performed: preparing to see the patient (eg, review of tests, pre-charting review of  records) obtaining and/or reviewing separately obtained history performing a medically appropriate examination and/or evaluation counseling and educating the patient/family/caregiver ordering medications, tests, or procedures referring and communicating with other health care professionals (when not separately reported) documenting clinical information in the electronic or other health record independently interpreting results (not separately reported) and communicating results to the patient/ family/caregiver care coordination (not separately reported)  Note by: Gaspar Cola, MD Date: 05/28/2022; Time: 11:23 AM

## 2022-05-29 ENCOUNTER — Telehealth: Payer: Self-pay

## 2022-05-29 NOTE — Telephone Encounter (Signed)
Insurance Treatment Denial Note  Date order was entered:  Order entered by: Milinda Pointer, MD Requested treatment: Genicular RFA Reason for denial: Treatment not covered by plan. Recommended for approval:  Nothing we can do   They say this is investigational and will not be approved, even with a peer to peer the denial will stand.

## 2022-06-09 DIAGNOSIS — Z9049 Acquired absence of other specified parts of digestive tract: Secondary | ICD-10-CM | POA: Diagnosis not present

## 2022-06-09 DIAGNOSIS — K5010 Crohn's disease of large intestine without complications: Secondary | ICD-10-CM | POA: Diagnosis not present

## 2022-06-09 DIAGNOSIS — Z5181 Encounter for therapeutic drug level monitoring: Secondary | ICD-10-CM | POA: Diagnosis not present

## 2022-06-09 DIAGNOSIS — E538 Deficiency of other specified B group vitamins: Secondary | ICD-10-CM | POA: Diagnosis not present

## 2022-06-10 ENCOUNTER — Ambulatory Visit (INDEPENDENT_AMBULATORY_CARE_PROVIDER_SITE_OTHER): Payer: Medicare HMO

## 2022-06-10 DIAGNOSIS — E538 Deficiency of other specified B group vitamins: Secondary | ICD-10-CM | POA: Diagnosis not present

## 2022-06-10 MED ORDER — CYANOCOBALAMIN 1000 MCG/ML IJ SOLN
1000.0000 ug | Freq: Once | INTRAMUSCULAR | Status: AC
  Administered 2022-06-10: 1000 ug via INTRAMUSCULAR

## 2022-06-10 NOTE — Progress Notes (Signed)
Pt arrived for B12 injection, given in R deltoid. Pt tolerated injection well, showed no signs of distress nor voiced any concerns.

## 2022-06-19 ENCOUNTER — Telehealth: Payer: Self-pay | Admitting: Pain Medicine

## 2022-06-19 NOTE — Telephone Encounter (Signed)
Pharmacy states her script has run out. Please call pharmacy to verify what it needs. The script was just sent in 05-15-22

## 2022-06-19 NOTE — Telephone Encounter (Signed)
Script was there. Patient called and informed.

## 2022-06-20 DIAGNOSIS — L932 Other local lupus erythematosus: Secondary | ICD-10-CM | POA: Diagnosis not present

## 2022-07-01 DIAGNOSIS — M7542 Impingement syndrome of left shoulder: Secondary | ICD-10-CM | POA: Diagnosis not present

## 2022-07-10 ENCOUNTER — Ambulatory Visit (INDEPENDENT_AMBULATORY_CARE_PROVIDER_SITE_OTHER): Payer: Medicare HMO

## 2022-07-10 DIAGNOSIS — E538 Deficiency of other specified B group vitamins: Secondary | ICD-10-CM

## 2022-07-10 MED ORDER — CYANOCOBALAMIN 1000 MCG/ML IJ SOLN
1000.0000 ug | Freq: Once | INTRAMUSCULAR | Status: AC
  Administered 2022-07-10: 1000 ug via INTRAMUSCULAR

## 2022-07-10 NOTE — Progress Notes (Signed)
Pt presented for their vitamin B12 injection. Pt was identified through two identifiers. Pt tolerated shot well in their right deltoid.

## 2022-07-11 ENCOUNTER — Ambulatory Visit: Payer: Medicare HMO

## 2022-07-18 ENCOUNTER — Ambulatory Visit (INDEPENDENT_AMBULATORY_CARE_PROVIDER_SITE_OTHER): Payer: Medicare HMO

## 2022-07-18 VITALS — Ht 66.0 in | Wt 142.0 lb

## 2022-07-18 DIAGNOSIS — Z0000 Encounter for general adult medical examination without abnormal findings: Secondary | ICD-10-CM | POA: Diagnosis not present

## 2022-07-18 NOTE — Progress Notes (Signed)
Subjective:   Ellen Drake is a 76 y.o. female who presents for Medicare Annual (Subsequent) preventive examination.  Review of Systems    No ROS.  Medicare Wellness Virtual Visit.  Visual/audio telehealth visit, UTA vital signs.   See social history for additional risk factors.   Cardiac Risk Factors include: advanced age (>75mn, >>73women)     Objective:    Today's Vitals   07/18/22 0936  Weight: 142 lb (64.4 kg)  Height: 5' 6"  (1.676 m)   Body mass index is 22.92 kg/m.     07/18/2022   12:27 PM 05/13/2022    2:01 PM 07/17/2021   11:23 AM 05/08/2021    8:16 AM 03/07/2021   12:14 PM 02/05/2021    3:09 PM 01/17/2021   10:44 AM  Advanced Directives  Does Patient Have a Medical Advance Directive? No No No No No No No  Would patient like information on creating a medical advance directive? No - Patient declined No - Patient declined No - Patient declined No - Patient declined       Current Medications (verified) Outpatient Encounter Medications as of 07/18/2022  Medication Sig   amLODipine (NORVASC) 10 MG tablet TAKE 1 TABLET EVERY DAY   ASPIRIN 81 PO Take by mouth daily.   atenolol (TENORMIN) 50 MG tablet TAKE 1 TABLET EVERY DAY   brimonidine (ALPHAGAN) 0.2 % ophthalmic solution 1 drop 2 (two) times daily.    clotrimazole-betamethasone (LOTRISONE) cream APPLY TO AFFECTED AREA TWICE A DAY   gabapentin (NEURONTIN) 100 MG capsule Take one capsule q am and one capsule q hs   hydrochlorothiazide (MICROZIDE) 12.5 MG capsule TAKE 1 CAPSULE BY MOUTH EVERY DAY   latanoprost (XALATAN) 0.005 % ophthalmic solution Place 1 drop into both eyes at bedtime.    lisinopril (ZESTRIL) 40 MG tablet TAKE 1 TABLET EVERY DAY   nystatin cream (MYCOSTATIN) Apply 1 application topically 2 (two) times daily.   rosuvastatin (CRESTOR) 10 MG tablet TAKE 1 TABLET EVERY DAY   sulfaSALAzine (AZULFIDINE) 500 MG tablet Take 1,500 mg by mouth 2 (two) times daily. 3 tabs   timolol (TIMOPTIC) 0.5 %  ophthalmic solution Place 1 drop into both eyes 2 (two) times daily.   traMADol (ULTRAM) 50 MG tablet Take 1 tablet (50 mg total) by mouth 2 (two) times daily. Each refill must last 30 days.   No facility-administered encounter medications on file as of 07/18/2022.    Allergies (verified) Mercaptopurine, Lyrica [pregabalin], and Dilaudid [hydromorphone hcl]   History: Past Medical History:  Diagnosis Date   Anemia    reslved   Colon polyp 2009   [place]    Diverticulosis    Fibrocystic breast disease    Herniated nucleus pulposus of lumbosacral region    History of chicken pox    Hypercholesterolemia    Hyperlipidemia    Hypertension    Osteoarthritis    Pulmonary nodule seen on imaging study 2004   resolved 2007   Past Surgical History:  Procedure Laterality Date   anterior cervical discectomy and fusion  1998   Dr DCherene AltesSURGERY  1992   ruptured disc (Dr CMauri Pole   BREAST BIOPSY Left    neg-no scar seen   CATARACT EXTRACTION W/PHACO Right 02/09/2019   Procedure: CATARACT EXTRACTION PHACO AND INTRAOCULAR LENS PLACEMENT ([place] RIGHT [place]AL[place] 5, VISION BLUE;  Surgeon: BLeandrew Koyanagi MD;  [place]  Service: Ophthalmology;  Laterality:  Right;   COLECTOMY  2003   [place]   COLONOSCOPY  2009   COLONOSCOPY W/ BIOPSIES  11/30/2012   Procedure: COLONOSCOPY W/BIOPSY; Surgeon: Colvin Caroli, MD; Location: [place]; Service: Gastroenterology;;   COLONOSCOPY W/ BIOPSIES  04/15/2016   Procedure: Colonoscopy ; Surgeon: Colvin Caroli, MD; Location: [place]; Service: Gastroenterology; Laterality: N/A;   KNEE ARTHROSCOPY W/ SYNOVECTOMY Right 03/10/2014   limited   KNEE SURGERY Right 1/16   [facility] (Dr. Sherlynn Carbon)   [place] / DECOMPRESSION LUMBAR SPINE  1999   REPLACEMENT TOTAL KNEE     ROTATOR CUFF REPAIR Left 2007   acute open; accident    Family History  Problem Relation Age of Onset   CVA Mother    Hypertension Mother    Hypertension Sister        brain tumor   Breast cancer Neg Hx    Colon cancer Neg Hx    Social History   Socioeconomic History   Marital status: Married    Spouse name: Not on file   Number of children: 0   Years of education: 12   Highest education level: Not on file  Occupational History   Not on file  Tobacco Use   Smoking status: Former    Types: Cigarettes    Quit date: 09/10/2009    Years since quitting: 12.8   Smokeless tobacco: Never  Vaping Use   Vaping Use: Never used  Substance and Sexual Activity   Alcohol use: Yes    Alcohol/week: 1.0 standard drink of alcohol    Types: 1 Glasses of wine per week    Comment: wine occasional; socially   Drug use: No   Sexual activity: Yes  Other Topics Concern   Not on file  Social History Narrative   Not on file   Social Determinants of Health   Financial Resource Strain: Low Risk  (07/18/2022)   Overall Financial Resource Strain (CARDIA)    Difficulty of Paying Living Expenses: Not hard at all  Food Insecurity: No Food Insecurity (07/18/2022)   Hunger Vital Sign    Worried About Running Out of Food in the Last Year: Never true    Ran Out of Food in the Last Year: Never true  Transportation Needs: No Transportation Needs (07/18/2022)   PRAPARE - Hydrologist (Medical): No    Lack of Transportation (Non-Medical): No  Physical Activity: Unknown (07/17/2021)   Exercise Vital Sign    Days of Exercise per Week: 0 days    Minutes of Exercise per Session: Not on file  Stress: No Stress Concern Present (07/18/2022)   [place]    Feeling of Stress : Not at all  Social Connections: Unknown (07/18/2022)   Social Connection and Isolation Panel [NHANES]    Frequency of Communication with Friends and Family: More than three times a week     Frequency of Social Gatherings with Friends and Family: More than three times a week    Attends Religious Services: Not on Advertising copywriter or Organizations: Yes    Attends Music therapist: More than 4 times per year    Marital Status: Married    Tobacco Counseling Counseling given: Not Answered   Clinical Intake:  Pre-visit preparation completed: Yes        Diabetes: No  How often do you need  to have someone help you when you read instructions, pamphlets, or other written materials from your doctor or pharmacy?: 1 - Never    Interpreter Needed?: No    Activities of Daily Living    07/18/2022   12:28 PM  In your present state of health, do you have any difficulty performing the following activities:  Hearing? 0  Vision? 0  Difficulty concentrating or making decisions? 0  Walking or climbing stairs? 0  Dressing or bathing? 0  Doing errands, shopping? 0  Preparing Food and eating ? N  Using the Toilet? N  In the past six months, have you accidently leaked urine? N  Do you have problems with loss of bowel control? N  Managing your Medications? N  Managing your Finances? N  Housekeeping or managing your Housekeeping? N    Patient Care Team: Einar Pheasant, MD as PCP - General (Internal Medicine)  Indicate any recent Medical Services you may have received from other than [facility] providers in the past year (date may be approximate).     Assessment:   This is a routine wellness examination for Ellen Drake.  I connected with  Arnoldo Hooker Day Castille on 07/18/22 by a audio enabled telemedicine application and verified that I am speaking with the correct person using two identifiers.  Patient Location: Home  Provider Location: Office/Clinic  I discussed the limitations of evaluation and management by telemedicine. The patient expressed understanding and agreed to proceed.   Hearing/Vision screen Hearing Screening - Comments:: Patient is able to  hear conversational tones without difficulty.  No issues reported.  Vision Screening - Comments:: Followed by [facility]  Wears glasses  Semi-annual visits  Dietary issues and exercise activities discussed: Current Exercise Habits: Home exercise routine, Intensity: Mild Regular diet Good water intake   Goals Addressed               This Visit's Progress     Patient Stated     Follow up with Primary Care Provider (pt-stated)        As needed.       Depression Screen    07/18/2022   12:26 PM 05/13/2022    2:00 PM 04/23/2022   11:13 AM 12/31/2021   10:38 AM 07/17/2021   11:22 AM 05/08/2021    8:16 AM 03/07/2021   12:14 PM  PHQ 2/9 Scores  PHQ - 2 Score 0 0 0 0 0 0 0    Fall Risk    07/18/2022   12:28 PM 05/13/2022    2:00 PM 04/23/2022   11:13 AM 12/31/2021   10:38 AM 07/24/2021    8:40 AM  Fall Risk   Falls in the past year? 0 0 0 0 0  Number falls in past yr: 0      Injury with Fall? 0      Risk for fall due to :   No Fall Risks No Fall Risks   Follow up Falls evaluation completed  Falls evaluation completed Falls evaluation completed     [place]: Home free of loose throw rugs in walkways, pet beds, electrical cords, etc? Yes  Adequate lighting in your home to reduce risk of falls? Yes   ASSISTIVE DEVICES UTILIZED TO PREVENT FALLS: Life alert? No  Use of a cane, walker or w/c? Yes , as needed  TIMED UP AND GO: Was the test performed? No .   Cognitive Function:    07/21/2016   10:42 AM  07/20/2015   10:49 AM  MMSE - Mini Mental State Exam  Orientation to time 5 5  Orientation to Place 5 5  Registration 3 3  Attention/ Calculation 5 5  Recall 2 3  Recall-comments 2 out of 3 recalled   Language- name 2 objects 2 2  Language- repeat 1 1  Language- follow 3 step command 3 3  Language- read & follow direction 1 1  Write a sentence 1 1  Copy design 1 1  Total score 29 30        07/18/2022   12:28 PM  07/16/2020   11:27 AM 07/14/2019   12:01 PM  6CIT Screen  What Year? 0 points 0 points 0 points  What month? 0 points 0 points 0 points  What time? 0 points 0 points 0 points  Count back from 20 0 points 0 points 0 points  Months in reverse 0 points 0 points 0 points  Repeat phrase 0 points    Total Score 0 points      Immunizations Immunization History  Administered Date(s) Administered   Fluad Quad(high Dose 65+) 07/26/2019, 06/20/2020, 05/29/2021, 04/23/2022   Influenza Split 05/13/2011, 08/12/2012, 04/29/2014   Influenza, High Dose Seasonal PF 07/14/2016, 07/15/2017, 04/29/2018   Influenza, Seasonal, Injecte, Preservative Fre 06/05/2014   Influenza,inj,Quad PF,6+ Mos 05/06/2013, 06/08/2015   Moderna Sars-Covid-2 Vaccination 02/26/2021   PFIZER(Purple Top)SARS-COV-2 Vaccination 09/21/2019, 10/12/2019, 06/06/2020   Pneumococcal Conjugate-13 07/21/2016   Pneumococcal Polysaccharide-23 09/01/2014   TDAP status: Due, Education has been provided regarding the importance of this vaccine. Advised may receive this vaccine at local pharmacy or Health Dept. Aware to provide a copy of the vaccination record if obtained from local pharmacy or Health Dept. Verbalized acceptance and understanding.  Covid-19 vaccine status: Completed vaccines  Shingrix Completed?: No.    Education has been provided regarding the importance of this vaccine. Patient has been advised to call insurance company to determine out of pocket expense if they have not yet received this vaccine. Advised may also receive vaccine at local pharmacy or Health Dept. Verbalized acceptance and understanding.  Screening Tests Health Maintenance  Topic Date Due   Hepatitis C Screening  Never done   DTaP/Tdap/Td (1 - Tdap) Never done   COVID-19 Vaccine (5 - 2023-24 season) 08/03/2022 (Originally 04/11/2022)   Zoster Vaccines- Shingrix (1 of 2) 10/17/2022 (Originally 11/18/1964)   MAMMOGRAM  12/26/2022   Medicare Annual Wellness  (AWV)  07/19/2023   Pneumonia Vaccine 39+ Years old  Completed   INFLUENZA VACCINE  Completed   DEXA SCAN  Completed   HPV VACCINES  Aged Out   COLONOSCOPY (Pts 45-60yr Insurance coverage will need to be confirmed)  Discontinued   Health Maintenance Health Maintenance Due  Topic Date Due   Hepatitis C Screening  Never done   DTaP/Tdap/Td (1 - Tdap) Never done   Hepatitis C Screening: deferred per patient preference.   Vision Screening: Recommended annual ophthalmology exams for early detection of glaucoma and other disorders of the eye.  Dental Screening: Recommended annual dental exams for proper oral hygiene,  Community Resource Referral / Chronic Care Management: CRR required this visit?  No   CCM required this visit?  No      Plan:     I have personally reviewed and noted the following in the patient's chart:   Medical and social history Use of alcohol, tobacco or illicit drugs  Current medications and supplements including opioid prescriptions. Patient is not currently taking opioid  prescriptions. Functional ability and status Nutritional status Physical activity Advanced directives List of other physicians Hospitalizations, surgeries, and ER visits in previous 12 months Vitals Screenings to include cognitive, depression, and falls Referrals and appointments  In addition, I have reviewed and discussed with patient certain preventive protocols, quality metrics, and best practice recommendations. A written personalized care plan for preventive services as well as general preventive health recommendations were provided to patient.     Leta Jungling, LPN   [id number]/[id number]

## 2022-07-18 NOTE — Patient Instructions (Addendum)
Ellen Drake , Thank you for taking time to come for your Medicare Wellness Visit. I appreciate your ongoing commitment to your health goals. Please review the following plan we discussed and let me know if I can assist you in the future.   These are the goals we discussed:  Goals       Patient Stated     Follow up with Primary Care Provider (pt-stated)      As needed.        This is a list of the screening recommended for you and due dates:  Health Maintenance  Topic Date Due   Hepatitis C Screening: USPSTF Recommendation to screen - Ages 66-79 yo.  Never done   DTaP/Tdap/Td vaccine (1 - Tdap) Never done   COVID-19 Vaccine (5 - 2023-24 season) 08/03/2022*   Zoster (Shingles) Vaccine (1 of 2) 10/17/2022*   Mammogram  12/26/2022   Medicare Annual Wellness Visit  07/19/2023   Pneumonia Vaccine  Completed   Flu Shot  Completed   DEXA scan (bone density measurement)  Completed   HPV Vaccine  Aged Out   Colon Cancer Screening  Discontinued  *Topic was postponed. The date shown is not the original due date.   Conditions/risks identified: none new  Next appointment: Follow up in one year for your annual wellness visit    Preventive Care 65 Years and Older, Female Preventive care refers to lifestyle choices and visits with your health care provider that can promote health and wellness. What does preventive care include? A yearly physical exam. This is also called an annual well check. Dental exams once or twice a year. Routine eye exams. Ask your health care provider how often you should have your eyes checked. Personal lifestyle choices, including: Daily care of your teeth and gums. Regular physical activity. Eating a healthy diet. Avoiding tobacco and drug use. Limiting alcohol use. Practicing safe sex. Taking low-dose aspirin every day. Taking vitamin and mineral supplements as recommended by your health care provider. What happens during an annual well check? The services  and screenings done by your health care provider during your annual well check will depend on your age, overall health, lifestyle risk factors, and family history of disease. Counseling  Your health care provider may ask you questions about your: Alcohol use. Tobacco use. Drug use. Emotional well-being. Home and relationship well-being. Sexual activity. Eating habits. History of falls. Memory and ability to understand (cognition). Work and work Statistician. Reproductive health. Screening  You may have the following tests or measurements: Height, weight, and BMI. Blood pressure. Lipid and cholesterol levels. These may be checked every 5 years, or more frequently if you are over 44 years old. Skin check. Lung cancer screening. You may have this screening every year starting at age 76 if you have a 30-pack-year history of smoking and currently smoke or have quit within the past 15 years. Fecal occult blood test (FOBT) of the stool. You may have this test every year starting at age 31. Flexible sigmoidoscopy or colonoscopy. You may have a sigmoidoscopy every 5 years or a colonoscopy every 10 years starting at age 10. Hepatitis C blood test. Hepatitis B blood test. Sexually transmitted disease (STD) testing. Diabetes screening. This is done by checking your blood sugar (glucose) after you have not eaten for a while (fasting). You may have this done every 1-3 years. Bone density scan. This is done to screen for osteoporosis. You may have this done starting at age 20. Mammogram. This  may be done every 1-2 years. Talk to your health care provider about how often you should have regular mammograms. Talk with your health care provider about your test results, treatment options, and if necessary, the need for more tests. Vaccines  Your health care provider may recommend certain vaccines, such as: Influenza vaccine. This is recommended every year. Tetanus, diphtheria, and acellular pertussis  (Tdap, Td) vaccine. You may need a Td booster every 10 years. Zoster vaccine. You may need this after age 13. Pneumococcal 13-valent conjugate (PCV13) vaccine. One dose is recommended after age 8. Pneumococcal polysaccharide (PPSV23) vaccine. One dose is recommended after age 76. Talk to your health care provider about which screenings and vaccines you need and how often you need them. This information is not intended to replace advice given to you by your health care provider. Make sure you discuss any questions you have with your health care provider. Document Released: 08/24/2015 Document Revised: 04/16/2016 Document Reviewed: 05/29/2015 Elsevier Interactive Patient Education  2017 [place] Prevention in the Home Falls can cause injuries. They can happen to people of all ages. There are many things you can do to make your home safe and to help prevent falls. What can I do on the outside of my home? Regularly fix the edges of walkways and driveways and fix any cracks. Remove anything that might make you trip as you walk through a door, such as a raised step or threshold. Trim any bushes or trees on the path to your home. Use bright outdoor lighting. Clear any walking paths of anything that might make someone trip, such as rocks or tools. Regularly check to see if handrails are loose or broken. Make sure that both sides of any steps have handrails. Any raised decks and porches should have guardrails on the edges. Have any leaves, snow, or ice cleared regularly. Use sand or salt on walking paths during winter. Clean up any spills in your garage right away. This includes oil or grease spills. What can I do in the bathroom? Use night lights. Install grab bars by the toilet and in the tub and shower. Do not use towel bars as grab bars. Use non-skid mats or decals in the tub or shower. If you need to sit down in the shower, use a plastic, non-slip stool. Keep the floor dry. Clean  up any water that spills on the floor as soon as it happens. Remove soap buildup in the tub or shower regularly. Attach bath mats securely with double-sided non-slip rug tape. Do not have throw rugs and other things on the floor that can make you trip. What can I do in the bedroom? Use night lights. Make sure that you have a light by your bed that is easy to reach. Do not use any sheets or blankets that are too big for your bed. They should not hang down onto the floor. Have a firm chair that has side arms. You can use this for support while you get dressed. Do not have throw rugs and other things on the floor that can make you trip. What can I do in the kitchen? Clean up any spills right away. Avoid walking on wet floors. Keep items that you use a lot in easy-to-reach places. If you need to reach something above you, use a strong step stool that has a grab bar. Keep electrical cords out of the way. Do not use floor polish or wax that makes floors slippery. If you must  use wax, use non-skid floor wax. Do not have throw rugs and other things on the floor that can make you trip. What can I do with my stairs? Do not leave any items on the stairs. Make sure that there are handrails on both sides of the stairs and use them. Fix handrails that are broken or loose. Make sure that handrails are as long as the stairways. Check any carpeting to make sure that it is firmly attached to the stairs. Fix any carpet that is loose or worn. Avoid having throw rugs at the top or bottom of the stairs. If you do have throw rugs, attach them to the floor with carpet tape. Make sure that you have a light switch at the top of the stairs and the bottom of the stairs. If you do not have them, ask someone to add them for you. What else can I do to help prevent falls? Wear shoes that: Do not have high heels. Have rubber bottoms. Are comfortable and fit you well. Are closed at the toe. Do not wear sandals. If you  use a stepladder: Make sure that it is fully opened. Do not climb a closed stepladder. Make sure that both sides of the stepladder are locked into place. Ask someone to hold it for you, if possible. Clearly mark and make sure that you can see: Any grab bars or handrails. First and last steps. Where the edge of each step is. Use tools that help you move around (mobility aids) if they are needed. These include: Canes. Walkers. Scooters. Crutches. Turn on the lights when you go into a dark area. Replace any light bulbs as soon as they burn out. Set up your furniture so you have a clear path. Avoid moving your furniture around. If any of your floors are uneven, fix them. If there are any pets around you, be aware of where they are. Review your medicines with your doctor. Some medicines can make you feel dizzy. This can increase your chance of falling. Ask your doctor what other things that you can do to help prevent falls. This information is not intended to replace advice given to you by your health care provider. Make sure you discuss any questions you have with your health care provider. Document Released: 05/24/2009 Document Revised: 01/03/2016 Document Reviewed: 09/01/2014 Elsevier Interactive Patient Education  2017 [employer].

## 2022-08-12 ENCOUNTER — Ambulatory Visit (INDEPENDENT_AMBULATORY_CARE_PROVIDER_SITE_OTHER): Payer: Medicare HMO

## 2022-08-12 DIAGNOSIS — E538 Deficiency of other specified B group vitamins: Secondary | ICD-10-CM

## 2022-08-12 MED ORDER — CYANOCOBALAMIN 1000 MCG/ML IJ SOLN
1000.0000 ug | Freq: Once | INTRAMUSCULAR | Status: AC
  Administered 2022-08-12: 1000 ug via INTRAMUSCULAR

## 2022-08-12 NOTE — Progress Notes (Signed)
Patient presented for B 12 injection to right deltoid, patient voiced no concerns nor showed any signs of distress during injection. 

## 2022-08-20 ENCOUNTER — Emergency Department: Payer: Medicare HMO

## 2022-08-20 ENCOUNTER — Emergency Department
Admission: EM | Admit: 2022-08-20 | Discharge: 2022-08-20 | Disposition: A | Discharge status: Home / Self Care | Payer: Medicare HMO | Attending: Emergency Medicine | Admitting: Emergency Medicine

## 2022-08-20 ENCOUNTER — Other Ambulatory Visit: Payer: Self-pay

## 2022-08-20 ENCOUNTER — Encounter: Payer: Self-pay | Admitting: Emergency Medicine

## 2022-08-20 DIAGNOSIS — S0993XA Unspecified injury of face, initial encounter: Secondary | ICD-10-CM | POA: Diagnosis present

## 2022-08-20 DIAGNOSIS — I10 Essential (primary) hypertension: Secondary | ICD-10-CM | POA: Insufficient documentation

## 2022-08-20 DIAGNOSIS — S80919A Unspecified superficial injury of unspecified knee, initial encounter: Secondary | ICD-10-CM | POA: Diagnosis not present

## 2022-08-20 DIAGNOSIS — S80212A Abrasion, left knee, initial encounter: Secondary | ICD-10-CM

## 2022-08-20 DIAGNOSIS — W01198A Fall on same level from slipping, tripping and stumbling with subsequent striking against other object, initial encounter: Secondary | ICD-10-CM | POA: Diagnosis not present

## 2022-08-20 DIAGNOSIS — W19XXXA Unspecified fall, initial encounter: Secondary | ICD-10-CM | POA: Diagnosis not present

## 2022-08-20 DIAGNOSIS — S01112A Laceration without foreign body of left eyelid and periocular area, initial encounter: Secondary | ICD-10-CM | POA: Diagnosis not present

## 2022-08-20 DIAGNOSIS — S0181XA Laceration without foreign body of other part of head, initial encounter: Secondary | ICD-10-CM

## 2022-08-20 DIAGNOSIS — M25562 Pain in left knee: Secondary | ICD-10-CM | POA: Diagnosis not present

## 2022-08-20 DIAGNOSIS — R58 Hemorrhage, not elsewhere classified: Secondary | ICD-10-CM | POA: Diagnosis not present

## 2022-08-20 DIAGNOSIS — Z23 Encounter for immunization: Secondary | ICD-10-CM | POA: Insufficient documentation

## 2022-08-20 DIAGNOSIS — I6782 Cerebral ischemia: Secondary | ICD-10-CM | POA: Diagnosis not present

## 2022-08-20 DIAGNOSIS — S0990XA Unspecified injury of head, initial encounter: Secondary | ICD-10-CM | POA: Diagnosis not present

## 2022-08-20 MED ORDER — TRAMADOL HCL 50 MG PO TABS
50.0000 mg | ORAL_TABLET | Freq: Once | ORAL | Status: AC
  Administered 2022-08-20: 50 mg via ORAL
  Filled 2022-08-20: qty 1

## 2022-08-20 MED ORDER — TETANUS-DIPHTH-ACELL PERTUSSIS 5-2.5-18.5 LF-MCG/0.5 IM SUSY
0.5000 mL | PREFILLED_SYRINGE | Freq: Once | INTRAMUSCULAR | Status: AC
  Administered 2022-08-20: 0.5 mL via INTRAMUSCULAR
  Filled 2022-08-20: qty 0.5

## 2022-08-20 MED ORDER — LIDOCAINE HCL (PF) 1 % IJ SOLN
5.0000 mL | Freq: Once | INTRAMUSCULAR | Status: AC
  Administered 2022-08-20: 5 mL via INTRADERMAL
  Filled 2022-08-20: qty 5

## 2022-08-20 MED ORDER — BACITRACIN ZINC 500 UNIT/GM EX OINT
TOPICAL_OINTMENT | Freq: Once | CUTANEOUS | Status: AC
  Filled 2022-08-20: qty 0.9

## 2022-08-20 MED ORDER — TRAMADOL HCL 50 MG PO TABS: 50.0000 mg | ORAL_TABLET | Freq: Two times a day (BID) | ORAL | 0 refills | Status: DC | PRN

## 2022-08-20 NOTE — ED Triage Notes (Signed)
Patient to ED via [facility] for a fall. Patient c/o pain on left knee and on left eyebrow- lac noted. Bleeding controlled at this time. Aox4- denies LOC.

## 2022-08-20 NOTE — ED Notes (Signed)
Pt here via [facility] with a mechanical fall in the parking lot. Pt denies LOC or blood thinners. Pt has a small lac on her left eyebrow, A&O. Pt also c/o left knee pain.  178/78 113-cbg 97% RA 60

## 2022-08-20 NOTE — ED Provider Notes (Signed)
[facility] Provider Note    Event Date/Time   First MD Initiated Contact with Patient 08/20/22 1505     (approximate)   History   Fall (/)   HPI  Ellen Drake is a 77 y.o. female with history of hypertension, Crohn's, and as listed in the EMR presents to the emergency department for treatment and evaluation after mechanical, nonsyncopal fall today prior to arrival.  Patient was getting out of her car and struck her foot on the curb.  She fell forward and hit her face and scraped her left knee.  She has a laceration to the lateral aspect of the left eyebrow.  Bleeding well-controlled.  Unknown date of last Tdap.     Physical Exam   Triage Vital Signs: ED Triage Vitals  Enc Vitals Group     BP 08/20/22 1426 (!) 163/72     Pulse Rate 08/20/22 1426 (!) 52     Resp 08/20/22 1426 18     Temp 08/20/22 1426 97.7 F (36.5 C)     Temp Source 08/20/22 1426 Oral     SpO2 08/20/22 1426 100 %     Weight 08/20/22 1425 139 lb (63 kg)     Height 08/20/22 1425 '5\' 5"'$  (1.651 m)     Head Circumference --      Peak Flow --      Pain Score 08/20/22 1425 5     Pain Loc --      Pain Edu? --      Excl. in [place]? --     Most recent vital signs: Vitals:   08/20/22 1426  BP: (!) 163/72  Pulse: (!) 52  Resp: 18  Temp: 97.7 F (36.5 C)  SpO2: 100%     General: Awake, no distress.  CV:  Good peripheral perfusion.  Resp:  Normal effort.  Abd:  No distention.  Other:  3 cm stellate laceration to the lateral aspect of the left eyebrow.  Scant amount of active bleeding. Abrasion to the left knee.  No active bleeding.  Patient able to demonstrate flexion and extension of the knee without pain.   ED Results / Procedures / Treatments   Labs (all labs ordered are listed, but only abnormal results are displayed) Labs Reviewed - No data to display   EKG  Not indicated   RADIOLOGY  CT of the head negative for acute concerns.  DG image of the left knee  viewed and interpreted by me.  No bony abnormality or obvious joint effusion.  Radiology report consistent with the same.  PROCEDURES:  Critical Care performed: No  ..Laceration Repair  Date/Time: 08/20/2022 5:38 PM  Performed by: Victorino Dike, FNP Authorized by: Victorino Dike, FNP   Consent:    Consent obtained:  Verbal   Consent given by:  Patient   Risks discussed:  Poor cosmetic result Universal protocol:    Patient identity confirmed:  Verbally with patient Anesthesia:    Anesthesia method:  Local infiltration   Local anesthetic:  Lidocaine 1% w/o epi Laceration details:    Location:  Face   Face location:  L eyebrow   Length (cm):  3 Pre-procedure details:    Preparation:  Patient was prepped and draped in usual sterile fashion Treatment:    Area cleansed with:  Saline and chlorhexidine   Irrigation method:  Syringe   Debridement:  Minimal   Layers/structures repaired:  Deep subcutaneous Deep subcutaneous:    Suture size:  6-0   Suture material:  Vicryl   Suture technique:  Simple interrupted Skin repair:    Repair method:  Sutures   Suture size:  6-0   Suture material:  Prolene   Suture technique:  Simple interrupted   Number of sutures:  4 Approximation:    Approximation:  Close Repair type:    Repair type:  Simple Post-procedure details:    Dressing:  Antibiotic ointment   Procedure completion:  Tolerated well, no immediate complications    MEDICATIONS ORDERED IN ED: Medications  lidocaine (PF) (XYLOCAINE) 1 % injection 5 mL (5 mLs Intradermal Given by Other 08/20/22 1654)  traMADol (ULTRAM) tablet 50 mg (50 mg Oral Given 08/20/22 1653)  Tdap (BOOSTRIX) injection 0.5 mL (0.5 mLs Intramuscular Given 08/20/22 1653)  bacitracin ointment ( Topical Given 08/20/22 1722)     IMPRESSION / MDM / ASSESSMENT AND PLAN / ED COURSE  I reviewed the triage vital signs and the nursing notes.                              Differential diagnosis includes, but  is not limited to, minor head injury, concussion, subdural hematoma, skull fracture, facial laceration, retained foreign body, open fracture.  Knee abrasion, knee contusion, patellar fracture, tib-fib fracture, tibial plateau fracture  Patient's presentation is most consistent with acute presentation with potential threat to life or bodily function.  77 year old female presenting to the emergency department for treatment and evaluation after mechanical, nonsyncopal fall just prior to arrival.  See HPI for further details.  Exam is reassuring.  She is awake, alert, oriented and in good spirits.  Pupils are equal, round, reactive to light.  She has no pain with extraocular eye movement.  She does have a 3 cm laceration to the lateral aspect of the left eyebrow.  Wound cleaned and repaired as described above.  Tdap updated.  Head CT is negative for acute concerns.  Left knee has a abrasion overlying the patella surface.  No active bleeding.  She is able to ambulate without limp.  X-ray of the knee is negative for any acute concerns.  Wound care discussed with the patient.  She already has an appointment scheduled with her primary care provider in 6 days for routine follow-up.  She will have the sutures removed then.  She is to be evaluated sooner for any sign or symptom of concern.  She will use ice off-and-on throughout the next couple of days to help with swelling and pain.  She was also given a prescription for a few tramadol to help as well.  If she develops any new symptoms or worsening symptoms and she is unable to see your primary care provider she is to return to the emergency department.    FINAL CLINICAL IMPRESSION(S) / ED DIAGNOSES   Final diagnoses:  Facial laceration, initial encounter  Abrasion of left knee, initial encounter   Rx / DC Orders   ED Discharge Orders          Ordered    traMADol (ULTRAM) 50 MG tablet  Every 12 hours PRN        08/20/22 1720           Note:   This document was prepared using Dragon voice recognition software and may include unintentional dictation errors.   Victorino Dike, FNP 08/20/22 Ellen Drake    Ellen Garfinkel, MD 08/21/22 2329

## 2022-08-20 NOTE — ED Notes (Signed)
Pt brought to treatment room after xrays.

## 2022-08-20 NOTE — Discharge Instructions (Signed)
Use an ice pack over your eye/knee 20 minutes per hour while awake for the first day or two.  Do not get the sutured area wet for 24 hours. After 24 hours, shower/bathe as usual and pat the area dry. Change the bandage 2 times per day and apply antibiotic ointment. Leave open to air when at no risk of getting the area dirty, but cover at night before bed. See your PCP or go to Urgent Care in 6 days for suture removal or sooner for signs or concern of infection.

## 2022-08-26 ENCOUNTER — Ambulatory Visit (INDEPENDENT_AMBULATORY_CARE_PROVIDER_SITE_OTHER): Payer: Medicare HMO

## 2022-08-26 ENCOUNTER — Ambulatory Visit
Admission: RE | Admit: 2022-08-26 | Discharge: 2022-08-26 | Disposition: A | Discharge status: Home / Self Care | Payer: Medicare HMO | Source: Ambulatory Visit | Attending: Internal Medicine | Admitting: Internal Medicine

## 2022-08-26 ENCOUNTER — Encounter: Payer: Self-pay | Admitting: Internal Medicine

## 2022-08-26 ENCOUNTER — Ambulatory Visit (INDEPENDENT_AMBULATORY_CARE_PROVIDER_SITE_OTHER): Payer: Medicare HMO | Admitting: Internal Medicine

## 2022-08-26 VITALS — BP 128/78 | HR 63 | Temp 97.9°F | Resp 14 | Ht 65.0 in | Wt 146.0 lb

## 2022-08-26 DIAGNOSIS — M25561 Pain in right knee: Secondary | ICD-10-CM | POA: Diagnosis not present

## 2022-08-26 DIAGNOSIS — I10 Essential (primary) hypertension: Secondary | ICD-10-CM

## 2022-08-26 DIAGNOSIS — S0990XA Unspecified injury of head, initial encounter: Secondary | ICD-10-CM | POA: Insufficient documentation

## 2022-08-26 DIAGNOSIS — Z1159 Encounter for screening for other viral diseases: Secondary | ICD-10-CM | POA: Diagnosis not present

## 2022-08-26 DIAGNOSIS — S0990XD Unspecified injury of head, subsequent encounter: Secondary | ICD-10-CM | POA: Diagnosis not present

## 2022-08-26 DIAGNOSIS — M1711 Unilateral primary osteoarthritis, right knee: Secondary | ICD-10-CM

## 2022-08-26 DIAGNOSIS — K5010 Crohn's disease of large intestine without complications: Secondary | ICD-10-CM | POA: Diagnosis not present

## 2022-08-26 DIAGNOSIS — M25461 Effusion, right knee: Secondary | ICD-10-CM

## 2022-08-26 DIAGNOSIS — E7800 Pure hypercholesterolemia, unspecified: Secondary | ICD-10-CM | POA: Diagnosis not present

## 2022-08-26 DIAGNOSIS — R519 Headache, unspecified: Secondary | ICD-10-CM | POA: Diagnosis not present

## 2022-08-26 DIAGNOSIS — Z471 Aftercare following joint replacement surgery: Secondary | ICD-10-CM | POA: Diagnosis not present

## 2022-08-26 LAB — CBC WITH DIFFERENTIAL/PLATELET
Basophils Absolute: 0 10*3/uL (ref 0.0–0.1)
Basophils Relative: 0.3 % (ref 0.0–3.0)
Eosinophils Absolute: 0.1 10*3/uL (ref 0.0–0.7)
Eosinophils Relative: 0.9 % (ref 0.0–5.0)
HCT: 35.6 % — ABNORMAL LOW (ref 36.0–46.0)
Hemoglobin: 12 g/dL (ref 12.0–15.0)
Lymphocytes Relative: 22.6 % (ref 12.0–46.0)
Lymphs Abs: 1.9 10*3/uL (ref 0.7–4.0)
MCHC: 33.8 g/dL (ref 30.0–36.0)
MCV: 99.2 fl (ref 78.0–100.0)
Monocytes Absolute: 0.8 10*3/uL (ref 0.1–1.0)
Monocytes Relative: 9.3 % (ref 3.0–12.0)
Neutro Abs: 5.8 10*3/uL (ref 1.4–7.7)
Neutrophils Relative %: 66.9 % (ref 43.0–77.0)
Platelets: 169 10*3/uL (ref 150.0–400.0)
RBC: 3.59 Mil/uL — ABNORMAL LOW (ref 3.87–5.11)
RDW: 13.4 % (ref 11.5–15.5)
WBC: 8.6 10*3/uL (ref 4.0–10.5)

## 2022-08-26 LAB — BASIC METABOLIC PANEL
BUN: 19 mg/dL (ref 6–23)
CO2: 30 mEq/L (ref 19–32)
Calcium: 9.9 mg/dL (ref 8.4–10.5)
Chloride: 105 mEq/L (ref 96–112)
Creatinine, Ser: 1.02 mg/dL (ref 0.40–1.20)
GFR: 53.34 mL/min — ABNORMAL LOW (ref >60.00)
Glucose, Bld: 98 mg/dL (ref 70–99)
Potassium: 3.7 mEq/L (ref 3.5–5.1)
Sodium: 143 mEq/L (ref 135–145)

## 2022-08-26 LAB — LIPID PANEL
Cholesterol: 146 mg/dL (ref 0–200)
HDL: 65.3 mg/dL (ref >39.00)
LDL Cholesterol: 62 mg/dL (ref 0–99)
NonHDL: 80.92
Total CHOL/HDL Ratio: 2
Triglycerides: 93 mg/dL (ref 0.0–149.0)
VLDL: 18.6 mg/dL (ref 0.0–40.0)

## 2022-08-26 LAB — HEPATIC FUNCTION PANEL
ALT: 16 U/L (ref 0–35)
AST: 26 U/L (ref 0–37)
Albumin: 4.3 g/dL (ref 3.5–5.2)
Alkaline Phosphatase: 49 U/L (ref 39–117)
Bilirubin, Direct: 0.1 mg/dL (ref 0.0–0.3)
Total Bilirubin: 0.3 mg/dL (ref 0.2–1.2)
Total Protein: 7.1 g/dL (ref 6.0–8.3)

## 2022-08-26 LAB — TSH: TSH: 2.58 u[IU]/mL (ref 0.35–5.50)

## 2022-08-26 NOTE — Progress Notes (Addendum)
Subjective:    Patient ID: Ellen Drake, female    DOB: 04-23-1946, 77 y.o.   MRN: [id number]  Patient here for  Chief Complaint  Patient presents with   Medical Management of Chronic Issues   Suture / Staple Removal    HPI Here to follow up regarding hypercholesterolemia and hypertension.  Followed by [facility] - Crohn's.  Due colonoscopy (was due 2023).  Seeing Noah Charon. She did fall 08/20/22 - evaluated in ER.  Fell after tripping over curb.  Hit face.  Laceration to lateral aspect of the left eyebrow. Four sutures placed. Head CT negative.  Tdap updated. Needs sutures removed today. She reports persistent increased head pain and throbbing in her head - frontal headache.  Request f/u CT scan.  Hematoma around right eye.  No LOC.  No chest pain or sob reported.  No abdominal pain.  Bowels doing well.  She does report some increased swelling in her right knee - s/p the fall.     Past Medical History:  Diagnosis Date   Anemia    reslved   Colon polyp 2009   Crohn's disease ([place])    Diverticulosis    Fibrocystic breast disease    Herniated nucleus pulposus of lumbosacral region    History of chicken pox    Hypercholesterolemia    Hyperlipidemia    Hypertension    Osteoarthritis    Pulmonary nodule seen on imaging study 2004   resolved 2007   Past Surgical History:  Procedure Laterality Date   anterior cervical discectomy and fusion  1998   Dr Cherene Altes SURGERY  1992   ruptured disc (Dr Mauri Pole)   BREAST BIOPSY Left    neg-no scar seen   CATARACT EXTRACTION W/PHACO Right 02/09/2019   Procedure: CATARACT EXTRACTION PHACO AND INTRAOCULAR LENS PLACEMENT ([place]) [place]  RIGHT HEALON 5, VISION BLUE;  Surgeon: Leandrew Koyanagi, MD;  Location: [place];  Service: Ophthalmology;  Laterality: Right;   COLECTOMY  2003   [place]   COLONOSCOPY  2009   COLONOSCOPY W/ BIOPSIES  11/30/2012   Procedure: COLONOSCOPY W/BIOPSY;  Surgeon: Colvin Caroli, MD; Location: [place]; Service: Gastroenterology;;   COLONOSCOPY W/ BIOPSIES  04/15/2016   Procedure: Colonoscopy ; Surgeon: Colvin Caroli, MD; Location: [place]; Service: Gastroenterology; Laterality: N/A;   KNEE ARTHROSCOPY W/ SYNOVECTOMY Right 03/10/2014   limited   KNEE SURGERY Right 1/16    [facility]tho (Dr. Sherlynn Carbon)   [place] / DECOMPRESSION LUMBAR SPINE  1999   REPLACEMENT TOTAL KNEE     ROTATOR CUFF REPAIR Left 2007   acute open; accident   Family History  Problem Relation Age of Onset   CVA Mother    Hypertension Mother    Hypertension Sister        brain tumor   Breast cancer Neg Hx    Colon cancer Neg Hx    Social History   Socioeconomic History   Marital status: Married    Spouse name: Not on file   Number of children: 0   Years of education: 12   Highest education level: Not on file  Occupational History   Not on file  Tobacco Use   Smoking status: Former    Types: Cigarettes    Quit date: 09/10/2009    Years since quitting: 12.9   Smokeless tobacco: Never  Vaping Use   Vaping Use: Never used  Substance  and Sexual Activity   Alcohol use: Yes    Alcohol/week: 1.0 standard drink of alcohol    Types: 1 Glasses of wine per week    Comment: wine occasional; socially   Drug use: No   Sexual activity: Yes  Other Topics Concern   Not on file  Social History Narrative   Not on file   Social Determinants of Health   Financial Resource Strain: Low Risk  (07/18/2022)   Overall Financial Resource Strain (CARDIA)    Difficulty of Paying Living Expenses: Not hard at all  Food Insecurity: No Food Insecurity (07/18/2022)   Hunger Vital Sign    Worried About Running Out of Food in the Last Year: Never true    Ran Out of Food in the Last Year: Never true  Transportation Needs: No Transportation Needs (07/18/2022)   PRAPARE - Hydrologist (Medical): No    Lack of  Transportation (Non-Medical): No  Physical Activity: Unknown (07/17/2021)   Exercise Vital Sign    Days of Exercise per Week: 0 days    Minutes of Exercise per Session: Not on file  Stress: No Stress Concern Present (07/18/2022)   [place]    Feeling of Stress : Not at all  Social Connections: Unknown (07/18/2022)   Social Connection and Isolation Panel [NHANES]    Frequency of Communication with Friends and Family: More than three times a week    Frequency of Social Gatherings with Friends and Family: More than three times a week    Attends Religious Services: Not on Advertising copywriter or Organizations: Yes    Attends Archivist Meetings: More than 4 times per year    Marital Status: Married     Review of Systems  Constitutional:  Negative for appetite change and unexpected weight change.  HENT:  Negative for congestion and sinus pressure.   Respiratory:  Negative for cough, chest tightness and shortness of breath.   Cardiovascular:  Negative for chest pain and palpitations.       No lower extremity edema.   Gastrointestinal:  Negative for abdominal pain, diarrhea, nausea and vomiting.  Genitourinary:  Negative for difficulty urinating and dysuria.  Musculoskeletal:        Right knee pain and swelling.    Skin:  Negative for color change and rash.       Laceration - over right eye.  Four sutures in place.   Neurological:  Negative for dizziness.       Head pain as outlined.    Psychiatric/Behavioral:  Negative for agitation and dysphoric mood.        Objective:     BP 128/78   Pulse 63   Temp 97.9 F (36.6 C)   Resp 14   Ht '5\' 5"'$  (1.651 m)   Wt 146 lb (66.2 kg)   LMP 08/13/1995   SpO2 96%   BMI 24.30 kg/m  Wt Readings from Last 3 Encounters:  08/26/22 146 lb (66.2 kg)  08/20/22 139 lb (63 kg)  07/18/22 142 lb (64.4 kg)    Physical Exam Vitals reviewed.  Constitutional:       General: She is not in acute distress.    Appearance: Normal appearance.  HENT:     Head: Normocephalic.     Comments: Hematoma - peri orbital region.  Increased pain to palpation.  Increased soft tissue swelling. Increased pain to palpation -  frontal     Right Ear: External ear normal.     Left Ear: External ear normal.     Mouth/Throat:     Pharynx: No oropharyngeal exudate or posterior oropharyngeal erythema.  Eyes:     General: No scleral icterus.       Right eye: No discharge.        Left eye: No discharge.     Conjunctiva/sclera: Conjunctivae normal.  Neck:     Thyroid: No thyromegaly.  Cardiovascular:     Rate and Rhythm: Normal rate and regular rhythm.  Pulmonary:     Effort: No respiratory distress.     Breath sounds: Normal breath sounds. No wheezing.  Abdominal:     General: Bowel sounds are normal.     Palpations: Abdomen is soft.     Tenderness: There is no abdominal tenderness.  Musculoskeletal:        General: No tenderness.     Cervical back: Neck supple. No tenderness.     Comments: Increased soft tissue swelling right knee.   Lymphadenopathy:     Cervical: No cervical adenopathy.  Skin:    Findings: No erythema or rash.  Neurological:     Mental Status: She is alert.  Psychiatric:        Mood and Affect: Mood normal.        Behavior: Behavior normal.      Outpatient Encounter Medications as of 08/26/2022  Medication Sig   amLODipine (NORVASC) 10 MG tablet TAKE 1 TABLET EVERY DAY   ASPIRIN 81 PO Take by mouth daily.   atenolol (TENORMIN) 50 MG tablet TAKE 1 TABLET EVERY DAY   brimonidine (ALPHAGAN) 0.2 % ophthalmic solution 1 drop 2 (two) times daily.    clotrimazole-betamethasone (LOTRISONE) cream APPLY TO AFFECTED AREA TWICE A DAY   gabapentin (NEURONTIN) 100 MG capsule Take one capsule q am and one capsule q hs   hydrochlorothiazide (MICROZIDE) 12.5 MG capsule TAKE 1 CAPSULE BY MOUTH EVERY DAY   latanoprost (XALATAN) 0.005 % ophthalmic  solution Place 1 drop into both eyes at bedtime.    lisinopril (ZESTRIL) 40 MG tablet TAKE 1 TABLET EVERY DAY   nystatin cream (MYCOSTATIN) Apply 1 application topically 2 (two) times daily.   rosuvastatin (CRESTOR) 10 MG tablet TAKE 1 TABLET EVERY DAY   sulfaSALAzine (AZULFIDINE) 500 MG tablet Take 1,500 mg by mouth 2 (two) times daily. 3 tabs   timolol (TIMOPTIC) 0.5 % ophthalmic solution Place 1 drop into both eyes 2 (two) times daily.   traMADol (ULTRAM) 50 MG tablet Take 1 tablet (50 mg total) by mouth every 12 (twelve) hours as needed.   No facility-administered encounter medications on file as of 08/26/2022.     Lab Results  Component Value Date   WBC 8.6 08/26/2022   HGB 12.0 08/26/2022   HCT 35.6 (L) 08/26/2022   PLT 169.0 08/26/2022   GLUCOSE 98 08/26/2022   CHOL 146 08/26/2022   TRIG 93.0 08/26/2022   HDL 65.30 08/26/2022   LDLDIRECT 132.5 05/06/2013   LDLCALC 62 08/26/2022   ALT 16 08/26/2022   AST 26 08/26/2022   NA 143 08/26/2022   K 3.7 08/26/2022   CL 105 08/26/2022   CREATININE 1.02 08/26/2022   BUN 19 08/26/2022   CO2 30 08/26/2022   TSH 2.58 08/26/2022   HGBA1C 5.7 10/19/2012    CT Head Wo Contrast  Result Date: 08/20/2022 CLINICAL DATA:  Fall.  Left eyebrow laceration. EXAM: CT HEAD WITHOUT CONTRAST TECHNIQUE:  Contiguous axial images were obtained from the base of the skull through the vertex without intravenous contrast. RADIATION DOSE REDUCTION: This exam was performed according to the departmental dose-optimization program which includes automated exposure control, adjustment of the mA and/or kV according to patient size and/or use of iterative reconstruction technique. COMPARISON:  None Available. FINDINGS: Brain: Moderate low density in the periventricular white matter likely related to small vessel disease. No mass lesion, hemorrhage, hydrocephalus, acute infarct, intra-axial, or extra-axial fluid collection. Vascular: No hyperdense vessel or unexpected  calcification. Intracranial atherosclerosis. Skull: no significant soft tissue swelling.  No skull fracture. Sinuses/Orbits: Normal imaged portions of the orbits and globes. Hypoplastic frontal sinuses. Clear mastoid air cells. Other: None. IMPRESSION: 1. No acute intracranial abnormality. 2. Small vessel ischemic change. Electronically Signed   By: Abigail Miyamoto M.D.   On: 08/20/2022 16:13   DG Knee Complete 4 Views Left  Result Date: 08/20/2022 CLINICAL DATA:  Fall with left knee pain EXAM: LEFT KNEE - COMPLETE 4+ VIEW COMPARISON:  None Available. FINDINGS: No fracture or malalignment. No significant knee effusion. Mild tricompartment degenerative change. Joint space calcifications. IMPRESSION: No acute osseous abnormality. Mild degenerative changes. Chondrocalcinosis. Electronically Signed   By: Donavan Foil M.D.   On: 08/20/2022 15:16       Assessment & Plan:  Crohn's disease of large intestine without complication ([place]) -     CBC with Differential/Platelet  Hypercholesterolemia Assessment & Plan: Continue crestor.  Follow lipid panel and liver function tests.   Orders: -     Hepatic function panel -     TSH -     Lipid panel  Primary hypertension Assessment & Plan: Continue lisinopril, amlodipine and atenolol.  Recently added hctz 12.'5mg'$  q day.   Pressures is better.  Continue current medication regimen.  Follow pressures.  Follow metabolic panel.   Orders: -     Basic metabolic panel  Encounter for hepatitis C screening test for low risk patient -     Hepatitis C antibody  Traumatic injury of head, subsequent encounter Assessment & Plan: Recent fall with head injury.  Increased head pain and throbbing - persistent - frontal region and peri orbital region.  Laceration over left eye.  EOMI.  Sutures removed without problems.  Given persistent increased pain and throbbing - will obtain head CT scan to further evaluate.    Orders: -     CT HEAD WO CONTRAST (5MM); Future  Pain  and swelling of knee (Right) Assessment & Plan: Has had several surgeries on her knee.  Now being followed by pain clinic.  Recent fall.  Increased swelling.  Check knee xray.    Orders: -     DG Knee 1-2 Views Right; Future  Crohn's disease of colon without complication ([place]) Assessment & Plan: Stable.  Continue sulfasalazine.  Being followed by GI ([facility]).  Discussed colonoscopy (per note was due 2023).  Discussed with her today.  States was informed by GI - f/u next year.    Osteoarthritis of knee (Right) Assessment & Plan: Has had several surgeries on her knee.  Now being followed by pain clinic.  Recent fall.  Increased swelling.  Check knee xray.        Einar Pheasant, MD

## 2022-08-26 NOTE — Assessment & Plan Note (Signed)
Recent fall with head injury.  Increased head pain and throbbing - persistent - frontal region and peri orbital region.  Laceration over left eye.  EOMI.  Sutures removed without problems.  Given persistent increased pain and throbbing - will obtain head CT scan to further evaluate.

## 2022-08-27 ENCOUNTER — Encounter: Payer: Self-pay | Admitting: Internal Medicine

## 2022-08-27 ENCOUNTER — Other Ambulatory Visit: Payer: Self-pay

## 2022-08-27 ENCOUNTER — Telehealth: Payer: Self-pay | Admitting: Internal Medicine

## 2022-08-27 DIAGNOSIS — R944 Abnormal results of kidney function studies: Secondary | ICD-10-CM

## 2022-08-27 LAB — HEPATITIS C ANTIBODY: Hepatitis C Ab: NONREACTIVE

## 2022-08-27 NOTE — Telephone Encounter (Signed)
Pt returning call

## 2022-08-27 NOTE — Assessment & Plan Note (Signed)
Has had several surgeries on her knee.  Now being followed by pain clinic.  Recent fall.  Increased swelling.  Check knee xray.

## 2022-08-27 NOTE — Assessment & Plan Note (Signed)
Continue crestor.  Follow lipid panel and liver function tests.  

## 2022-08-27 NOTE — Assessment & Plan Note (Signed)
Stable.  Continue sulfasalazine.  Being followed by GI ([facility]).  Discussed colonoscopy (per note was due 2023).  Discussed with her today.  States was informed by GI - f/u next year.

## 2022-08-27 NOTE — Assessment & Plan Note (Signed)
Continue lisinopril, amlodipine and atenolol.  Recently added hctz 12.'5mg'$  q day.   Pressures is better.  Continue current medication regimen.  Follow pressures.  Follow metabolic panel.

## 2022-08-27 NOTE — Telephone Encounter (Signed)
See result note

## 2022-08-30 ENCOUNTER — Other Ambulatory Visit: Payer: Self-pay | Admitting: Internal Medicine

## 2022-09-01 ENCOUNTER — Telehealth: Payer: Self-pay

## 2022-09-01 NOTE — Telephone Encounter (Signed)
     Patient  visit on 08/21/2019  at [facility] was for Fall, Facial laceration, initial encounter.  Have you been able to follow up with your primary care physician? Yes  The patient was or was not able to obtain any needed medicine or equipment. Patient obtained medication.  Are there diet recommendations that you are having difficulty following? No  Patient expresses understanding of discharge instructions and education provided has no other needs at this time.    [place] Resource Care Guide   [email or web address]  ?? [phone]   [email or web address]: [email or web address]  [email or web address]

## 2022-09-07 NOTE — Progress Notes (Unsigned)
PROVIDER NOTE: Information contained herein reflects review and annotations entered in association with encounter. Interpretation of such information and data should be left to medically-trained personnel. Information provided to patient can be located elsewhere in the medical record under "Patient Instructions". Document created using STT-dictation technology, any transcriptional errors that may result from process are unintentional.    Patient: Ellen Drake  Service Category: E/M  Provider: Gaspar Cola, MD  DOB: Mar 15, 1946  DOS: 09/08/2022  Referring Provider: Einar Pheasant, MD  MRN: [id number]  Specialty: Interventional Pain Management  PCP: Einar Pheasant, MD  Type: Established Patient  Setting: Ambulatory outpatient    Location: Office  Delivery: Face-to-face     HPI  Ellen Drake, a 77 y.o. year old female, is here today because of her No primary diagnosis found.. Ellen Drake's primary complain today is No chief complaint on file. Last encounter: My last encounter with her was on 06/19/2022. Pertinent problems: Ellen Drake has Arthritis; Chronic knee pain (1ry area of Pain) (Right); S/P revision of total knee (Right); Osteoarthritis of knee (Right); Right leg swelling; Shoulder pain, bilateral; Disorder of the skin and subcutaneous tissue, unspecified; Synovitis of knee; History of unicompartmental knee replacement (Right); Chronic pain syndrome; Chronic knee pain s/p TKR (Right); Pain and swelling of knee (Right); Neurogenic pain; and Left shoulder pain on their pertinent problem list. Pain Assessment: Severity of   is reported as a  /10. Location:    / . Onset:  . Quality:  . Timing:  . Modifying factor(s):  Marland Kitchen Vitals:  vitals were not taken for this visit.  BMI: Estimated body mass index is 24.3 kg/m as calculated from the following:   Height as of 08/26/22: '5\' 5"'$  (1.651 m).   Weight as of 08/26/22: 146 lb (66.2 kg).  Reason for encounter:  *** . ***  Pharmacotherapy  Assessment  Analgesic: Tramadol 50 mg 1 tab p.o. twice daily (100 mg/day of tramadol) (10 MME) MME/day: 10 mg/day   Monitoring: [place] PMP: PDMP reviewed during this encounter.       Pharmacotherapy: No side-effects or adverse reactions reported. Compliance: No problems identified. Effectiveness: Clinically acceptable.  No notes on file  No results found for: "CBDTHCR" No results found for: "D8THCCBX" No results found for: "D9THCCBX"  UDS:  Summary  Date Value Ref Range Status  05/13/2022 Note  Final    Comment:    ==================================================================== [employer] [facility] [employer] (MW) ==================================================================== Test                             Result       Flag       Units  Drug Present and Declared for Prescription Verification   Tramadol                       >3226        EXPECTED   ng/mg creat   O-Desmethyltramadol            >3226        EXPECTED   ng/mg creat   N-Desmethyltramadol            1348         EXPECTED   ng/mg creat    Source of tramadol is a prescription medication. O-desmethyltramadol    and N-desmethyltramadol are expected metabolites of tramadol.  ==================================================================== Test  Result    Flag   Units      Ref Range   Creatinine              155              mg/dL      >=20 ==================================================================== Declared Medications:  The flagging and interpretation on this report are based on the  following declared medications.  Unexpected results may arise from  inaccuracies in the declared medications.   **Note: The testing scope of this panel includes these medications:   Tramadol (Ultram)   **Note: The testing scope of this panel does not include the  following reported medications:   Amlodipine (Norvasc)  Aspirin  Atenolol (Tenormin)  Betamethasone (Lotrisone)  Clotrimazole (Lotrisone)   Eye Drop  Gabapentin (Neurontin)  Hydrochlorothiazide (Microzide)  Lisinopril (Zestril)  Nystatin  Prednisone (Deltasone)  Rosuvastatin (Crestor)  Sulfasalazine (Azulfidine) ==================================================================== For clinical consultation, please call [phone]. ====================================================================       ROS  Constitutional: Denies any fever or chills Gastrointestinal: No reported hemesis, hematochezia, vomiting, or acute GI distress Musculoskeletal: Denies any acute onset joint swelling, redness, loss of ROM, or weakness Neurological: No reported episodes of acute onset apraxia, aphasia, dysarthria, agnosia, amnesia, paralysis, loss of coordination, or loss of consciousness  Medication Review  Aspirin, amLODipine, atenolol, brimonidine, clotrimazole-betamethasone, gabapentin, hydrochlorothiazide, latanoprost, lisinopril, nystatin cream, rosuvastatin, sulfaSALAzine, timolol, and traMADol  History Review  Allergy: Ellen Drake is allergic to mercaptopurine, lyrica [pregabalin], and dilaudid [hydromorphone hcl]. Drug: Ellen Drake  reports no history of drug use. Alcohol:  reports current alcohol use of about 1.0 standard drink of alcohol per week. Tobacco:  reports that she quit smoking about 13 years ago. Her smoking use included cigarettes. She has never used smokeless tobacco. Social: Ellen Drake  reports that she quit smoking about 13 years ago. Her smoking use included cigarettes. She has never used smokeless tobacco. She reports current alcohol use of about 1.0 standard drink of alcohol per week. She reports that she does not use drugs. Medical:  has a past medical history of Anemia, Colon polyp (2009), Crohn's disease ([place]), Diverticulosis, Fibrocystic breast disease, Herniated nucleus pulposus of lumbosacral region, History of chicken pox, Hypercholesterolemia, Hyperlipidemia, Hypertension, Osteoarthritis, and  Pulmonary nodule seen on imaging study (2004). Surgical: Ellen Drake  has a past surgical history that includes Back surgery (1992); anterior cervical discectomy and fusion (1998); Knee surgery (Right, 1/16); Colonoscopy (2009); Colon surgery (1998); Posterior laminectomy / decompression lumbar spine (1999); Rotator cuff repair (Left, 2007); Colonoscopy w/ biopsies (11/30/2012); Colectomy (2003); Replacement total knee; Knee arthroscopy w/ synovectomy (Right, 03/10/2014); Colonoscopy w/ biopsies (04/15/2016); Cataract extraction w/PHACO (Right, 02/09/2019); and Breast biopsy (Left). Family: family history includes CVA in her mother; Hypertension in her mother and sister.  Laboratory Chemistry Profile   Renal Lab Results  Component Value Date   BUN 19 08/26/2022   CREATININE 1.02 08/26/2022   GFR 53.34 (L) 08/26/2022    Hepatic Lab Results  Component Value Date   AST 26 08/26/2022   ALT 16 08/26/2022   ALBUMIN 4.3 08/26/2022   ALKPHOS 49 08/26/2022    Electrolytes Lab Results  Component Value Date   NA 143 08/26/2022   K 3.7 08/26/2022   CL 105 08/26/2022   CALCIUM 9.9 08/26/2022   MG 2.0 01/14/2021    Bone Lab Results  Component Value Date   VD25OH 8.33 (L) 01/14/2021    Inflammation (CRP: Acute Phase) (ESR: Chronic Phase) Lab Results  Component  Value Date   CRP 3.0 (H) 01/14/2021   ESRSEDRATE 31 (H) 01/14/2021         Note: Above Lab results reviewed.  Recent Imaging Review  DG Knee 1-2 Views Right CLINICAL DATA:  Right knee pain.  Status post injury.  EXAM: RIGHT KNEE - 1-2 VIEW  COMPARISON:  October 30, 2020  FINDINGS: The patient is status post knee replacement. There is lucency along the medial aspect of the femoral component proximally. This lucency is stable. The remainder of the femoral component is normal in appearance.  The tibial component is in good position with no evidence of failure.  No fracture or dislocation. No joint effusion.  Vascular calcifications noted.  IMPRESSION: 1. The patient is status post right knee replacement. There is lucency along the medial aspect of the femoral component proximally. Lucency could represent loosening but the finding is stable. 2. No acute abnormalities.  Electronically Signed   By: Dorise Bullion III M.D.   On: 08/26/2022 12:02 CT HEAD WO CONTRAST (5MM) CLINICAL DATA:  Fall on 08/20/2022, hit left side of head. Persistent left frontal pain.  EXAM: CT HEAD WITHOUT CONTRAST  TECHNIQUE: Contiguous axial images were obtained from the base of the skull through the vertex without intravenous contrast.  RADIATION DOSE REDUCTION: This exam was performed according to the departmental dose-optimization program which includes automated exposure control, adjustment of the mA and/or kV according to patient size and/or use of iterative reconstruction technique.  COMPARISON:  CT head 08/20/2022  FINDINGS: Brain: There is no acute intracranial hemorrhage, extra-axial fluid collection, or acute infarct  Parenchymal volume is stable and within normal limits. The ventricles are stable in size. Patchy hypodensity in the supratentorial white matter is unchanged, likely reflecting background chronic small-vessel ischemic change  There is no mass lesion. There is no mass effect or midline shift. The pituitary and suprasellar region are normal.  Vascular: No hyperdense vessel or unexpected calcification.  Skull: Normal. Negative for fracture or focal lesion.  Sinuses/Orbits: The imaged paranasal sinuses are clear. The imaged globes and orbits are unremarkable.  Other: None.  IMPRESSION: Stable noncontrast head CT with no acute intracranial pathology.  Electronically Signed   By: Valetta Mole M.D.   On: 08/26/2022 11:47 Note: Reviewed        Physical Exam  General appearance: Well nourished, well developed, and well hydrated. In no apparent acute distress Mental status:  Alert, oriented x 3 (person, place, & time)       Respiratory: No evidence of acute respiratory distress Eyes: PERLA Vitals: LMP 08/13/1995  BMI: Estimated body mass index is 24.3 kg/m as calculated from the following:   Height as of 08/26/22: '5\' 5"'$  (1.651 m).   Weight as of 08/26/22: 146 lb (66.2 kg). Ideal: Ideal body weight: 57 kg (125 lb 10.6 oz) Adjusted ideal body weight: 60.7 kg (133 lb 12.8 oz)  Assessment   Diagnosis Status  No diagnosis found. Controlled Controlled Controlled   Updated Problems: No problems updated.  Plan of Care  Problem-specific:  No problem-specific Assessment & Plan notes found for this encounter.  Ellen Drake has a current medication list which includes the following long-term medication(s): amlodipine, atenolol, gabapentin, hydrochlorothiazide, lisinopril, rosuvastatin, sulfasalazine, and tramadol.  Pharmacotherapy (Medications Ordered): No orders of the defined types were placed in this encounter.  Orders:  No orders of the defined types were placed in this encounter.  Follow-up plan:   No follow-ups on file.     Interventional  Therapies  Risk  Complexity Considerations:   Estimated body mass index is 22.6 kg/m as calculated from the following:   Height as of this encounter: '5\' 6"'$  (1.676 m).   Weight as of this encounter: 140 lb (63.5 kg). Allergy: Lyrica, Dilaudid Note: Does not want SCS    Planned  Pending:   Therapeutic right genicular nerve RFA #2    Under consideration:   Diagnostic right Lumbar spinal cord stimulator trial (Does not want.)   Completed:   Diagnostic right genicular NB x1 (01/17/2021) (100/100/0/0)  Therapeutic right genicular nerve RFA x1 (03/07/2021) (100/100/100/90)    Therapeutic  Palliative (PRN) options:   Palliative right genicular nerve RFA       Recent Visits No visits were found meeting these conditions. Showing recent visits within past 90 days and meeting all other  requirements Future Appointments Date Type Provider Dept  09/08/22 Appointment Milinda Pointer, [facility]  11/03/22 Appointment Milinda Pointer, MD [facility]  Showing future appointments within next 90 days and meeting all other requirements  I discussed the assessment and treatment plan with the patient. The patient was provided an opportunity to ask questions and all were answered. The patient agreed with the plan and demonstrated an understanding of the instructions.  Patient advised to call back or seek an in-person evaluation if the symptoms or condition worsens.  Duration of encounter: *** minutes.  Total time on encounter, as per AMA guidelines included both the face-to-face and non-face-to-face time personally spent by the physician and/or other qualified health care professional(s) on the day of the encounter (includes time in activities that require the physician or other qualified health care professional and does not include time in activities normally performed by clinical staff). Physician's time may include the following activities when performed: Preparing to see the patient (e.g., pre-charting review of records, searching for previously ordered imaging, lab work, and nerve conduction tests) Review of prior analgesic pharmacotherapies. Reviewing PMP Interpreting ordered tests (e.g., lab work, imaging, nerve conduction tests) Performing post-procedure evaluations, including interpretation of diagnostic procedures Obtaining and/or reviewing separately obtained history Performing a medically appropriate examination and/or evaluation Counseling and educating the patient/family/caregiver Ordering medications, tests, or procedures Referring and communicating with other health care professionals (when not separately reported) Documenting clinical information in the electronic or other health record Independently interpreting results (not separately  reported) and communicating results to the patient/ family/caregiver Care coordination (not separately reported)  Note by: Gaspar Cola, MD Date: 09/08/2022; Time: 6:04 PM

## 2022-09-08 ENCOUNTER — Encounter: Payer: Self-pay | Admitting: Pain Medicine

## 2022-09-08 ENCOUNTER — Ambulatory Visit: Payer: Medicare HMO | Attending: Pain Medicine | Admitting: Pain Medicine

## 2022-09-08 VITALS — BP 142/67 | HR 61 | Temp 97.3°F | Resp 18 | Ht 66.0 in | Wt 143.0 lb

## 2022-09-08 DIAGNOSIS — G8929 Other chronic pain: Secondary | ICD-10-CM | POA: Diagnosis present

## 2022-09-08 DIAGNOSIS — M25512 Pain in left shoulder: Secondary | ICD-10-CM | POA: Insufficient documentation

## 2022-09-08 DIAGNOSIS — Z87891 Personal history of nicotine dependence: Secondary | ICD-10-CM | POA: Insufficient documentation

## 2022-09-08 DIAGNOSIS — M1711 Unilateral primary osteoarthritis, right knee: Secondary | ICD-10-CM | POA: Diagnosis not present

## 2022-09-08 DIAGNOSIS — S0990XS Unspecified injury of head, sequela: Secondary | ICD-10-CM | POA: Insufficient documentation

## 2022-09-08 DIAGNOSIS — W19XXXS Unspecified fall, sequela: Secondary | ICD-10-CM | POA: Diagnosis not present

## 2022-09-08 DIAGNOSIS — M25461 Effusion, right knee: Secondary | ICD-10-CM | POA: Diagnosis not present

## 2022-09-08 DIAGNOSIS — G894 Chronic pain syndrome: Secondary | ICD-10-CM | POA: Insufficient documentation

## 2022-09-08 DIAGNOSIS — M25561 Pain in right knee: Secondary | ICD-10-CM | POA: Diagnosis not present

## 2022-09-08 DIAGNOSIS — Z96651 Presence of right artificial knee joint: Secondary | ICD-10-CM | POA: Diagnosis not present

## 2022-09-08 DIAGNOSIS — Z823 Family history of stroke: Secondary | ICD-10-CM | POA: Diagnosis not present

## 2022-09-08 DIAGNOSIS — Z8249 Family history of ischemic heart disease and other diseases of the circulatory system: Secondary | ICD-10-CM | POA: Insufficient documentation

## 2022-09-08 MED ORDER — KETOROLAC TROMETHAMINE 30 MG/ML IJ SOLN
30.0000 mg | Freq: Once | INTRAMUSCULAR | Status: AC
  Administered 2022-09-08: 30 mg via INTRAMUSCULAR
  Filled 2022-09-08: qty 1

## 2022-09-08 MED ORDER — METHOCARBAMOL 1000 MG/10ML IJ SOLN
200.0000 mg | Freq: Once | INTRAMUSCULAR | Status: AC
  Administered 2022-09-08: 200 mg via INTRAMUSCULAR
  Filled 2022-09-08: qty 10

## 2022-09-08 NOTE — Progress Notes (Signed)
Safety precautions to be maintained throughout the outpatient stay will include: orient to surroundings, keep bed in low position, maintain call bell within reach at all times, provide assistance with transfer out of bed and ambulation.   Nursing Pain Medication Assessment:  Safety precautions to be maintained throughout the outpatient stay will include: orient to surroundings, keep bed in low position, maintain call bell within reach at all times, provide assistance with transfer out of bed and ambulation.  Medication Inspection Compliance: Pill count conducted under aseptic conditions, in front of the patient. Neither the pills nor the bottle was removed from the patient's sight at any time. Once count was completed pills were immediately returned to the patient in their original bottle.  Medication: Tramadol (Ultram) Pill/Patch Count:  56 of 60 pills remain Pill/Patch Appearance: Markings consistent with prescribed medication Bottle Appearance: Standard pharmacy container. Clearly labeled. Filled Date: 1 / 64 / 2024 Last Medication intake:  Yesterday

## 2022-09-08 NOTE — Patient Instructions (Signed)

## 2022-09-10 ENCOUNTER — Other Ambulatory Visit (INDEPENDENT_AMBULATORY_CARE_PROVIDER_SITE_OTHER): Payer: Medicare HMO

## 2022-09-10 DIAGNOSIS — R944 Abnormal results of kidney function studies: Secondary | ICD-10-CM | POA: Diagnosis not present

## 2022-09-10 LAB — BASIC METABOLIC PANEL
BUN: 20 mg/dL (ref 6–23)
CO2: 30 mEq/L (ref 19–32)
Calcium: 10 mg/dL (ref 8.4–10.5)
Chloride: 105 mEq/L (ref 96–112)
Creatinine, Ser: 0.92 mg/dL (ref 0.40–1.20)
GFR: 60.36 mL/min (ref >60.00)
Glucose, Bld: 93 mg/dL (ref 70–99)
Potassium: 3.7 mEq/L (ref 3.5–5.1)
Sodium: 142 mEq/L (ref 135–145)

## 2022-09-15 ENCOUNTER — Ambulatory Visit (INDEPENDENT_AMBULATORY_CARE_PROVIDER_SITE_OTHER): Payer: Medicare HMO

## 2022-09-15 ENCOUNTER — Ambulatory Visit: Payer: Medicare HMO

## 2022-09-15 DIAGNOSIS — E538 Deficiency of other specified B group vitamins: Secondary | ICD-10-CM | POA: Diagnosis not present

## 2022-09-15 MED ORDER — CYANOCOBALAMIN 1000 MCG/ML IJ SOLN
1000.0000 ug | Freq: Once | INTRAMUSCULAR | Status: AC
  Administered 2022-09-15: 1000 ug via INTRAMUSCULAR

## 2022-09-15 NOTE — Progress Notes (Signed)
Pt presented for their vitamin B12 injection. Pt was identified through two identifiers. Pt tolerated shot well in their left  deltoid.  

## 2022-09-22 DIAGNOSIS — H401133 Primary open-angle glaucoma, bilateral, severe stage: Secondary | ICD-10-CM | POA: Diagnosis not present

## 2022-09-22 DIAGNOSIS — Z0100 Encounter for examination of eyes and vision without abnormal findings: Secondary | ICD-10-CM | POA: Diagnosis not present

## 2022-09-23 ENCOUNTER — Telehealth: Payer: Self-pay

## 2022-09-23 NOTE — Telephone Encounter (Signed)
Insurance Treatment Denial Note  Date order was entered:  Order entered by: Milinda Pointer, MD Requested treatment: Genicular RFA Reason for denial: Treatment not covered by plan. Recommended for approval:  not covered   RFA for knee is not covered by her plan.

## 2022-09-24 DIAGNOSIS — Z0100 Encounter for examination of eyes and vision without abnormal findings: Secondary | ICD-10-CM | POA: Diagnosis not present

## 2022-10-06 ENCOUNTER — Telehealth: Payer: Self-pay | Admitting: Internal Medicine

## 2022-10-06 NOTE — Telephone Encounter (Signed)
Pt called in staying that she would like to have another CT scan of her head. As per pt, something still not right, her eye still hurt and she would like to check it out. Any questions, she can be reach [phone].

## 2022-10-06 NOTE — Telephone Encounter (Signed)
Pt sched to discuss w/ Dr Nicki Reaper 3/6

## 2022-10-15 ENCOUNTER — Ambulatory Visit (INDEPENDENT_AMBULATORY_CARE_PROVIDER_SITE_OTHER): Payer: Medicare HMO | Admitting: Internal Medicine

## 2022-10-15 VITALS — BP 128/70 | HR 71 | Temp 98.2°F | Resp 16 | Ht 66.0 in | Wt 145.0 lb

## 2022-10-15 DIAGNOSIS — R252 Cramp and spasm: Secondary | ICD-10-CM | POA: Diagnosis not present

## 2022-10-15 DIAGNOSIS — I10 Essential (primary) hypertension: Secondary | ICD-10-CM | POA: Diagnosis not present

## 2022-10-15 DIAGNOSIS — E7800 Pure hypercholesterolemia, unspecified: Secondary | ICD-10-CM

## 2022-10-15 DIAGNOSIS — M25461 Effusion, right knee: Secondary | ICD-10-CM | POA: Diagnosis not present

## 2022-10-15 DIAGNOSIS — M25561 Pain in right knee: Secondary | ICD-10-CM

## 2022-10-15 DIAGNOSIS — S0990XD Unspecified injury of head, subsequent encounter: Secondary | ICD-10-CM | POA: Diagnosis not present

## 2022-10-15 DIAGNOSIS — E538 Deficiency of other specified B group vitamins: Secondary | ICD-10-CM | POA: Diagnosis not present

## 2022-10-15 DIAGNOSIS — R42 Dizziness and giddiness: Secondary | ICD-10-CM | POA: Insufficient documentation

## 2022-10-15 DIAGNOSIS — K5010 Crohn's disease of large intestine without complications: Secondary | ICD-10-CM | POA: Diagnosis not present

## 2022-10-15 LAB — CBC WITH DIFFERENTIAL/PLATELET
Basophils Absolute: 0 10*3/uL (ref 0.0–0.1)
Basophils Relative: 0.4 % (ref 0.0–3.0)
Eosinophils Absolute: 0.1 10*3/uL (ref 0.0–0.7)
Eosinophils Relative: 1 % (ref 0.0–5.0)
HCT: 34 % — ABNORMAL LOW (ref 36.0–46.0)
Hemoglobin: 11.6 g/dL — ABNORMAL LOW (ref 12.0–15.0)
Lymphocytes Relative: 22 % (ref 12.0–46.0)
Lymphs Abs: 2.1 10*3/uL (ref 0.7–4.0)
MCHC: 34.1 g/dL (ref 30.0–36.0)
MCV: 99.4 fl (ref 78.0–100.0)
Monocytes Absolute: 0.8 10*3/uL (ref 0.1–1.0)
Monocytes Relative: 8.1 % (ref 3.0–12.0)
Neutro Abs: 6.6 10*3/uL (ref 1.4–7.7)
Neutrophils Relative %: 68.5 % (ref 43.0–77.0)
Platelets: 184 10*3/uL (ref 150.0–400.0)
RBC: 3.43 Mil/uL — ABNORMAL LOW (ref 3.87–5.11)
RDW: 13.5 % (ref 11.5–15.5)
WBC: 9.7 10*3/uL (ref 4.0–10.5)

## 2022-10-15 LAB — BASIC METABOLIC PANEL
BUN: 21 mg/dL (ref 6–23)
CO2: 29 mEq/L (ref 19–32)
Calcium: 9.9 mg/dL (ref 8.4–10.5)
Chloride: 105 mEq/L (ref 96–112)
Creatinine, Ser: 0.95 mg/dL (ref 0.40–1.20)
GFR: 58.04 mL/min — ABNORMAL LOW (ref >60.00)
Glucose, Bld: 83 mg/dL (ref 70–99)
Potassium: 3.4 mEq/L — ABNORMAL LOW (ref 3.5–5.1)
Sodium: 142 mEq/L (ref 135–145)

## 2022-10-15 LAB — MAGNESIUM: Magnesium: 1.6 mg/dL (ref 1.5–2.5)

## 2022-10-15 MED ORDER — CYANOCOBALAMIN 1000 MCG/ML IJ SOLN
1000.0000 ug | Freq: Once | INTRAMUSCULAR | Status: AC
  Administered 2022-10-15: 1000 ug via INTRAMUSCULAR

## 2022-10-15 NOTE — Progress Notes (Signed)
Subjective:    Patient ID: Ellen Drake, female    DOB: 1946-02-19, 77 y.o.   MRN: [id number]  Patient here for  Chief Complaint  Patient presents with   Eye Pain   Dizziness    HPI Here as a work in to discuss eye pain.  I saw her 08/26/22 - f/u appt.  Had a fall 08/20/22 - Fell after tripping over curb. Hit face. Laceration to lateral aspect of the left eyebrow. Four sutures placed. Head CT negative. Sutures removed.  On questioning her, it appears she was doing ok until a couple of weeks ago.  Noticed feeling a little light headed when bending over.  No significant headache.  Some pain to palpation over eye, but eye is ok.  No chest pain or sob.  Legs cramping.  Persistent pain - knee.  Some sweats at night.  Eating.  No abdominal pain.  Bowels stable.     Past Medical History:  Diagnosis Date   Anemia    reslved   Colon polyp 2009   Crohn's disease ([place])    Diverticulosis    Fibrocystic breast disease    Herniated nucleus pulposus of lumbosacral region    History of chicken pox    Hypercholesterolemia    Hyperlipidemia    Hypertension    Osteoarthritis    Pulmonary nodule seen on imaging study 2004   resolved 2007   Past Surgical History:  Procedure Laterality Date   anterior cervical discectomy and fusion  1998   Dr Cherene Altes SURGERY  1992   ruptured disc (Dr Mauri Pole)   BREAST BIOPSY Left    neg-no scar seen   CATARACT EXTRACTION W/PHACO Right 02/09/2019   Procedure: CATARACT EXTRACTION PHACO AND INTRAOCULAR LENS PLACEMENT ([place]) [place]  RIGHT HEALON 5, VISION BLUE;  Surgeon: Leandrew Koyanagi, MD;  Location: [facility];  Service: Ophthalmology;  Laterality: Right;   COLECTOMY  2003   [place]   COLONOSCOPY  2009   COLONOSCOPY W/ BIOPSIES  11/30/2012   Procedure: COLONOSCOPY W/BIOPSY; Surgeon: Colvin Caroli, MD; Location: [facility]; Service: Gastroenterology;;   COLONOSCOPY W/ BIOPSIES  04/15/2016    Procedure: Colonoscopy ; Surgeon: Colvin Caroli, MD; Location: [place]; Service: Gastroenterology; Laterality: N/A;   KNEE ARTHROSCOPY W/ SYNOVECTOMY Right 03/10/2014   limited   KNEE SURGERY Right 1/16   [facility] (Dr. Sherlynn Carbon)   [facility] / DECOMPRESSION LUMBAR SPINE  1999   REPLACEMENT TOTAL KNEE     ROTATOR CUFF REPAIR Left 2007   acute open; accident   Family History  Problem Relation Age of Onset   CVA Mother    Hypertension Mother    Hypertension Sister        brain tumor   Breast cancer Neg Hx    Colon cancer Neg Hx    Social History   Socioeconomic History   Marital status: Married    Spouse name: Not on file   Number of children: 0   Years of education: 12   Highest education level: Not on file  Occupational History   Not on file  Tobacco Use   Smoking status: Former    Types: Cigarettes    Quit date: 09/10/2009    Years since quitting: 13.1   Smokeless tobacco: Never  Vaping Use   Vaping Use: Never used  Substance and Sexual Activity   Alcohol use: Yes    Alcohol/week:  1.0 standard drink of alcohol    Types: 1 Glasses of wine per week    Comment: wine occasional; socially   Drug use: No   Sexual activity: Yes  Other Topics Concern   Not on file  Social History Narrative   Not on file   Social Determinants of Health   Financial Resource Strain: Low Risk  (07/18/2022)   Overall Financial Resource Strain (CARDIA)    Difficulty of Paying Living Expenses: Not hard at all  Food Insecurity: No Food Insecurity (07/18/2022)   Hunger Vital Sign    Worried About Running Out of Food in the Last Year: Never true    Ran Out of Food in the Last Year: Never true  Transportation Needs: No Transportation Needs (07/18/2022)   PRAPARE - Hydrologist (Medical): No    Lack of Transportation (Non-Medical): No  Physical Activity: Unknown (07/17/2021)   Exercise Vital Sign    Days of Exercise per Week: 0 days     Minutes of Exercise per Session: Not on file  Stress: No Stress Concern Present (07/18/2022)   [place]    Feeling of Stress : Not at all  Social Connections: Unknown (07/18/2022)   Social Connection and Isolation Panel [NHANES]    Frequency of Communication with Friends and Family: More than three times a week    Frequency of Social Gatherings with Friends and Family: More than three times a week    Attends Religious Services: Not on Advertising copywriter or Organizations: Yes    Attends Archivist Meetings: More than 4 times per year    Marital Status: Married     Review of Systems  Constitutional:  Positive for fatigue. Negative for appetite change and unexpected weight change.  HENT:  Negative for congestion and sinus pressure.   Respiratory:  Negative for cough, chest tightness and shortness of breath.   Cardiovascular:  Negative for chest pain and palpitations.  Gastrointestinal:  Negative for abdominal pain, diarrhea, nausea and vomiting.  Genitourinary:  Negative for difficulty urinating and dysuria.  Musculoskeletal:  Negative for joint swelling and myalgias.  Skin:  Negative for color change and rash.  Neurological:  Positive for light-headedness.       No increased headache.   Psychiatric/Behavioral:  Negative for agitation and dysphoric mood.        Objective:     BP 128/70   Pulse 71   Temp 98.2 F (36.8 C)   Resp 16   Ht '5\' 6"'$  (1.676 m)   Wt 145 lb (65.8 kg)   LMP 08/13/1995   SpO2 97%   BMI 23.40 kg/m  Wt Readings from Last 3 Encounters:  10/15/22 145 lb (65.8 kg)  09/08/22 143 lb (64.9 kg)  08/26/22 146 lb (66.2 kg)    Physical Exam Vitals reviewed.  Constitutional:      General: She is not in acute distress.    Appearance: Normal appearance.  HENT:     Head: Normocephalic and atraumatic.     Comments: Some tenderness to palpation - over right eye.      Right Ear: External ear normal.     Left Ear: External ear normal.     Ears:     Comments: Cerumen - right.  Eyes:     General: No scleral icterus.       Right eye: No discharge.  Left eye: No discharge.     Conjunctiva/sclera: Conjunctivae normal.  Neck:     Thyroid: No thyromegaly.  Cardiovascular:     Rate and Rhythm: Normal rate and regular rhythm.  Pulmonary:     Effort: No respiratory distress.     Breath sounds: Normal breath sounds. No wheezing.  Abdominal:     General: Bowel sounds are normal.     Palpations: Abdomen is soft.     Tenderness: There is no abdominal tenderness.  Musculoskeletal:        General: No swelling or tenderness.     Cervical back: Neck supple. No tenderness.  Lymphadenopathy:     Cervical: No cervical adenopathy.  Skin:    Findings: No erythema or rash.  Neurological:     Mental Status: She is alert.  Psychiatric:        Mood and Affect: Mood normal.        Behavior: Behavior normal.      Outpatient Encounter Medications as of 10/15/2022  Medication Sig   ASPIRIN 81 PO Take by mouth daily.   brimonidine (ALPHAGAN) 0.2 % ophthalmic solution 1 drop 2 (two) times daily.    clotrimazole-betamethasone (LOTRISONE) cream APPLY TO AFFECTED AREA TWICE A DAY   gabapentin (NEURONTIN) 100 MG capsule Take one capsule q am and one capsule q hs   hydrochlorothiazide (MICROZIDE) 12.5 MG capsule TAKE 1 CAPSULE BY MOUTH EVERY DAY   latanoprost (XALATAN) 0.005 % ophthalmic solution Place 1 drop into both eyes at bedtime.    lisinopril (ZESTRIL) 40 MG tablet TAKE 1 TABLET EVERY DAY   nystatin cream (MYCOSTATIN) Apply 1 application topically 2 (two) times daily.   sulfaSALAzine (AZULFIDINE) 500 MG tablet Take 1,500 mg by mouth 2 (two) times daily. 3 tabs   timolol (TIMOPTIC) 0.5 % ophthalmic solution Place 1 drop into both eyes 2 (two) times daily.   traMADol (ULTRAM) 50 MG tablet Take 1 tablet (50 mg total) by mouth every 12 (twelve) hours as needed.    [DISCONTINUED] amLODipine (NORVASC) 10 MG tablet TAKE 1 TABLET EVERY DAY   [DISCONTINUED] atenolol (TENORMIN) 50 MG tablet TAKE 1 TABLET EVERY DAY   [DISCONTINUED] rosuvastatin (CRESTOR) 10 MG tablet TAKE 1 TABLET EVERY DAY   [EXPIRED] cyanocobalamin (VITAMIN B12) injection 1,000 mcg    No facility-administered encounter medications on file as of 10/15/2022.     Lab Results  Component Value Date   WBC 9.7 10/15/2022   HGB 11.6 (L) 10/15/2022   HCT 34.0 (L) 10/15/2022   PLT 184.0 10/15/2022   GLUCOSE 83 10/15/2022   CHOL 146 08/26/2022   TRIG 93.0 08/26/2022   HDL 65.30 08/26/2022   LDLDIRECT 132.5 05/06/2013   LDLCALC 62 08/26/2022   ALT 16 08/26/2022   AST 26 08/26/2022   NA 142 10/15/2022   K 3.4 (L) 10/15/2022   CL 105 10/15/2022   CREATININE 0.95 10/15/2022   BUN 21 10/15/2022   CO2 29 10/15/2022   TSH 2.58 08/26/2022   HGBA1C 5.7 10/19/2012    DG Knee 1-2 Views Right  Result Date: 08/26/2022 CLINICAL DATA:  Right knee pain.  Status post injury. EXAM: RIGHT KNEE - 1-2 VIEW COMPARISON:  October 30, 2020 FINDINGS: The patient is status post knee replacement. There is lucency along the medial aspect of the femoral component proximally. This lucency is stable. The remainder of the femoral component is normal in appearance. The tibial component is in good position with no evidence of failure. No fracture or dislocation.  No joint effusion. Vascular calcifications noted. IMPRESSION: 1. The patient is status post right knee replacement. There is lucency along the medial aspect of the femoral component proximally. Lucency could represent loosening but the finding is stable. 2. No acute abnormalities. Electronically Signed   By: Dorise Bullion III M.D.   On: 08/26/2022 12:02   CT HEAD WO CONTRAST (5MM)  Result Date: 08/26/2022 CLINICAL DATA:  Fall on 08/20/2022, hit left side of head. Persistent left frontal pain. EXAM: CT HEAD WITHOUT CONTRAST TECHNIQUE: Contiguous axial images were  obtained from the base of the skull through the vertex without intravenous contrast. RADIATION DOSE REDUCTION: This exam was performed according to the departmental dose-optimization program which includes automated exposure control, adjustment of the mA and/or kV according to patient size and/or use of iterative reconstruction technique. COMPARISON:  CT head 08/20/2022 FINDINGS: Brain: There is no acute intracranial hemorrhage, extra-axial fluid collection, or acute infarct Parenchymal volume is stable and within normal limits. The ventricles are stable in size. Patchy hypodensity in the supratentorial white matter is unchanged, likely reflecting background chronic small-vessel ischemic change There is no mass lesion. There is no mass effect or midline shift. The pituitary and suprasellar region are normal. Vascular: No hyperdense vessel or unexpected calcification. Skull: Normal. Negative for fracture or focal lesion. Sinuses/Orbits: The imaged paranasal sinuses are clear. The imaged globes and orbits are unremarkable. Other: None. IMPRESSION: Stable noncontrast head CT with no acute intracranial pathology. Electronically Signed   By: Valetta Mole M.D.   On: 08/26/2022 11:47       Assessment & Plan:  Light headedness Assessment & Plan: Just started two weeks ago.  Not present initially after fall.  Previous CT ok.  Notices more when bends over.  Not orthostatic on exam.  Stay hydrated.  Check electrolytes and cbc as outlined.  Slow position changes and movements.  Follow.    Orders: -     EKG 12-Lead -     CBC with Differential/Platelet  Leg cramps Assessment & Plan: Stay hydrated.  Check electrolytes, magnesium.  Stretches.    Orders: -     Magnesium  Primary hypertension Assessment & Plan: Continue lisinopril, amlodipine and atenolol.  Recently added hctz 12.'5mg'$  q day.   Pressures is better.  Continue current medication regimen.  Follow pressures.  Follow metabolic panel. Not orthostatic on  exam.   Orders: -     Basic metabolic panel  123456 deficiency -     Cyanocobalamin  Crohn's disease of large intestine without complication ([place]) Assessment & Plan: Stable.  Continue sulfasalazine.  Being followed by GI ([facility]).    Hypercholesterolemia Assessment & Plan: Continue crestor.  Follow lipid panel and liver function tests.    Traumatic injury of head, subsequent encounter Assessment & Plan: Previous fall with injury as outlined in last note.  Some residual pain to palpation over eye.  Previous CT ok.  Follow.    Pain and swelling of knee (Right) Assessment & Plan: Has had several surgeries on her knee.  Now being followed by pain clinic.        Einar Pheasant, MD

## 2022-10-16 ENCOUNTER — Ambulatory Visit: Payer: Medicare HMO

## 2022-10-16 ENCOUNTER — Other Ambulatory Visit: Payer: Self-pay | Admitting: Internal Medicine

## 2022-10-17 ENCOUNTER — Other Ambulatory Visit: Payer: Self-pay

## 2022-10-17 DIAGNOSIS — E876 Hypokalemia: Secondary | ICD-10-CM

## 2022-10-17 DIAGNOSIS — R790 Abnormal level of blood mineral: Secondary | ICD-10-CM

## 2022-10-17 MED ORDER — MAGNESIUM OXIDE -MG SUPPLEMENT 400 (240 MG) MG PO TABS: 400.0000 mg | ORAL_TABLET | Freq: Every day | ORAL | 0 refills | Status: DC

## 2022-10-17 MED ORDER — POTASSIUM CHLORIDE CRYS ER 10 MEQ PO TBCR: 10.0000 meq | EXTENDED_RELEASE_TABLET | Freq: Every day | ORAL | 0 refills | Status: DC

## 2022-10-19 ENCOUNTER — Encounter: Payer: Self-pay | Admitting: Internal Medicine

## 2022-10-19 NOTE — Assessment & Plan Note (Addendum)
Continue lisinopril, amlodipine and atenolol.  Recently added hctz 12.'5mg'$  q day.   Pressures is better.  Continue current medication regimen.  Follow pressures.  Follow metabolic panel. Not orthostatic on exam.

## 2022-10-19 NOTE — Assessment & Plan Note (Signed)
Previous fall with injury as outlined in last note.  Some residual pain to palpation over eye.  Previous CT ok.  Follow.

## 2022-10-19 NOTE — Assessment & Plan Note (Signed)
Just started two weeks ago.  Not present initially after fall.  Previous CT ok.  Notices more when bends over.  Not orthostatic on exam.  Stay hydrated.  Check electrolytes and cbc as outlined.  Slow position changes and movements.  Follow.

## 2022-10-19 NOTE — Assessment & Plan Note (Signed)
Stay hydrated.  Check electrolytes, magnesium.  Stretches.

## 2022-10-19 NOTE — Assessment & Plan Note (Signed)
Has had several surgeries on her knee.  Now being followed by pain clinic.

## 2022-10-19 NOTE — Assessment & Plan Note (Signed)
Stable.  Continue sulfasalazine.  Being followed by GI ([facility]).  

## 2022-10-19 NOTE — Assessment & Plan Note (Signed)
Continue crestor. Follow lipid panel and liver function tests.   

## 2022-10-28 ENCOUNTER — Other Ambulatory Visit (INDEPENDENT_AMBULATORY_CARE_PROVIDER_SITE_OTHER): Payer: Medicare HMO

## 2022-10-28 DIAGNOSIS — R790 Abnormal level of blood mineral: Secondary | ICD-10-CM | POA: Diagnosis not present

## 2022-10-28 LAB — MAGNESIUM: Magnesium: 1.9 mg/dL (ref 1.5–2.5)

## 2022-10-28 LAB — POTASSIUM: Potassium: 3.8 mEq/L (ref 3.5–5.1)

## 2022-10-30 ENCOUNTER — Telehealth: Payer: Self-pay

## 2022-10-30 ENCOUNTER — Other Ambulatory Visit: Payer: Self-pay

## 2022-10-30 DIAGNOSIS — E876 Hypokalemia: Secondary | ICD-10-CM

## 2022-10-30 NOTE — Telephone Encounter (Signed)
LMTCB

## 2022-10-30 NOTE — Telephone Encounter (Signed)
-----   Message from Einar Pheasant, MD sent at 10/29/2022  4:42 AM EDT ----- Please call and notify - magnesium level improved.  Continue magnesium supplements - mag oxide 400mg  q day.  Also, potassium level improved.  Confirm on kcl 68meq q day.  If so, continue.  Would like to recheck potassium in 10-14 days.  Also need to confirm how she is doing.  Persistent symptoms? Light headedness?

## 2022-11-02 NOTE — Progress Notes (Unsigned)
PROVIDER NOTE: Information contained herein reflects review and annotations entered in association with encounter. Interpretation of such information and data should be left to medically-trained personnel. Information provided to patient can be located elsewhere in the medical record under "Patient Instructions". Document created using STT-dictation technology, any transcriptional errors that may result from process are unintentional.    Patient: Ellen Drake  Service Category: E/M  Provider: Gaspar Cola, MD  DOB: Mar 26, 1946  DOS: 11/03/2022  Referring Provider: Einar Pheasant, MD  MRN: [id number]  Specialty: Interventional Pain Management  PCP: Einar Pheasant, MD  Type: Established Patient  Setting: Ambulatory outpatient    Location: Office  Delivery: Face-to-face     HPI  Ms. Keta Day Stepanski, a 77 y.o. year old female, is here today because of her No primary diagnosis found.. Ms. Lazenby's primary complain today is No chief complaint on file.  Pertinent problems: Ms. Oaks has Arthritis; Chronic knee pain (1ry area of Pain) (Right); S/P revision of total knee (Right); Osteoarthritis of knee (Right); Right leg swelling; Shoulder pain, bilateral; Disorder of the skin and subcutaneous tissue, unspecified; Synovitis of knee; History of unicompartmental knee replacement (Right); Chronic pain syndrome; Chronic knee pain s/p TKR (Right); Pain and swelling of knee (Right); Neurogenic pain; Left shoulder pain; and Head trauma on their pertinent problem list. Pain Assessment: Severity of   is reported as a  /10. Location:    / . Onset:  . Quality:  . Timing:  . Modifying factor(s):  Marland Kitchen Vitals:  vitals were not taken for this visit.  BMI: Estimated body mass index is 23.4 kg/m as calculated from the following:   Height as of 10/15/22: 5\' 6"  (1.676 m).   Weight as of 10/15/22: 145 lb (65.8 kg). Last encounter: 09/08/2022. Last procedure: Visit date not found.  Reason for encounter:  *** .  ***  Pharmacotherapy Assessment  Analgesic: Tramadol 50 mg 1 tab PO BID (100 mg/day of tramadol) (10 MME) MME/day: 10 mg/day   Monitoring: [place] PMP: PDMP reviewed during this encounter.       Pharmacotherapy: No side-effects or adverse reactions reported. Compliance: No problems identified. Effectiveness: Clinically acceptable.  No notes on file  No results found for: "CBDTHCR" No results found for: "D8THCCBX" No results found for: "D9THCCBX"  UDS:  Summary  Date Value Ref Range Status  05/13/2022 Note  Final    Comment:    ==================================================================== [employer] [facility] 13 (MW) ==================================================================== Test                             Result       Flag       Units  Drug Present and Declared for Prescription Verification   Tramadol                       >3226        EXPECTED   ng/mg creat   O-Desmethyltramadol            >3226        EXPECTED   ng/mg creat   N-Desmethyltramadol            1348         EXPECTED   ng/mg creat    Source of tramadol is a prescription medication. O-desmethyltramadol    and N-desmethyltramadol are expected metabolites of tramadol.  ==================================================================== Test  Result    Flag   Units      Ref Range   Creatinine              155              mg/dL      >=20 ==================================================================== Declared Medications:  The flagging and interpretation on this report are based on the  following declared medications.  Unexpected results may arise from  inaccuracies in the declared medications.   **Note: The testing scope of this panel includes these medications:   Tramadol (Ultram)   **Note: The testing scope of this panel does not include the  following reported medications:   Amlodipine (Norvasc)  Aspirin  Atenolol (Tenormin)  Betamethasone (Lotrisone)  Clotrimazole  (Lotrisone)  Eye Drop  Gabapentin (Neurontin)  Hydrochlorothiazide (Microzide)  Lisinopril (Zestril)  Nystatin  Prednisone (Deltasone)  Rosuvastatin (Crestor)  Sulfasalazine (Azulfidine) ==================================================================== For clinical consultation, please call [phone]. ====================================================================       ROS  Constitutional: Denies any fever or chills Gastrointestinal: No reported hemesis, hematochezia, vomiting, or acute GI distress Musculoskeletal: Denies any acute onset joint swelling, redness, loss of ROM, or weakness Neurological: No reported episodes of acute onset apraxia, aphasia, dysarthria, agnosia, amnesia, paralysis, loss of coordination, or loss of consciousness  Medication Review  Aspirin, amLODipine, atenolol, brimonidine, clotrimazole-betamethasone, gabapentin, hydrochlorothiazide, latanoprost, lisinopril, magnesium oxide, nystatin cream, potassium chloride, rosuvastatin, sulfaSALAzine, timolol, and traMADol  History Review  Allergy: Ms. Warehime is allergic to mercaptopurine, lyrica [pregabalin], and dilaudid [hydromorphone hcl]. Drug: Ms. Radhakrishnan  reports no history of drug use. Alcohol:  reports current alcohol use of about 1.0 standard drink of alcohol per week. Tobacco:  reports that she quit smoking about 13 years ago. Her smoking use included cigarettes. She has never used smokeless tobacco. Social: Ms. Lindenmuth  reports that she quit smoking about 13 years ago. Her smoking use included cigarettes. She has never used smokeless tobacco. She reports current alcohol use of about 1.0 standard drink of alcohol per week. She reports that she does not use drugs. Medical:  has a past medical history of Anemia, Colon polyp (2009), Crohn's disease ([place]), Diverticulosis, Fibrocystic breast disease, Herniated nucleus pulposus of lumbosacral region, History of chicken pox, Hypercholesterolemia,  Hyperlipidemia, Hypertension, Osteoarthritis, and Pulmonary nodule seen on imaging study (2004). Surgical: Ms. Kuwahara  has a past surgical history that includes Back surgery (1992); anterior cervical discectomy and fusion (1998); Knee surgery (Right, 1/16); Colonoscopy (2009); Colon surgery (1998); Posterior laminectomy / decompression lumbar spine (1999); Rotator cuff repair (Left, 2007); Colonoscopy w/ biopsies (11/30/2012); Colectomy (2003); Replacement total knee; Knee arthroscopy w/ synovectomy (Right, 03/10/2014); Colonoscopy w/ biopsies (04/15/2016); Cataract extraction w/PHACO (Right, 02/09/2019); and Breast biopsy (Left). Family: family history includes CVA in her mother; Hypertension in her mother and sister.  Laboratory Chemistry Profile   Renal Lab Results  Component Value Date   BUN 21 10/15/2022   CREATININE 0.95 10/15/2022   GFR 58.04 (L) 10/15/2022    Hepatic Lab Results  Component Value Date   AST 26 08/26/2022   ALT 16 08/26/2022   ALBUMIN 4.3 08/26/2022   ALKPHOS 49 08/26/2022    Electrolytes Lab Results  Component Value Date   NA 142 10/15/2022   K 3.8 10/28/2022   CL 105 10/15/2022   CALCIUM 9.9 10/15/2022   MG 1.9 10/28/2022    Bone Lab Results  Component Value Date   VD25OH 8.33 (L) 01/14/2021    Inflammation (CRP: Acute Phase) (ESR: Chronic Phase)  Lab Results  Component Value Date   CRP 3.0 (H) 01/14/2021   ESRSEDRATE 31 (H) 01/14/2021         Note: Above Lab results reviewed.  Recent Imaging Review  DG Knee 1-2 Views Right CLINICAL DATA:  Right knee pain.  Status post injury.  EXAM: RIGHT KNEE - 1-2 VIEW  COMPARISON:  October 30, 2020  FINDINGS: The patient is status post knee replacement. There is lucency along the medial aspect of the femoral component proximally. This lucency is stable. The remainder of the femoral component is normal in appearance.  The tibial component is in good position with no evidence of failure.  No fracture  or dislocation. No joint effusion. Vascular calcifications noted.  IMPRESSION: 1. The patient is status post right knee replacement. There is lucency along the medial aspect of the femoral component proximally. Lucency could represent loosening but the finding is stable. 2. No acute abnormalities.  Electronically Signed   By: Dorise Bullion III M.D.   On: 08/26/2022 12:02 CT HEAD WO CONTRAST (5MM) CLINICAL DATA:  Fall on 08/20/2022, hit left side of head. Persistent left frontal pain.  EXAM: CT HEAD WITHOUT CONTRAST  TECHNIQUE: Contiguous axial images were obtained from the base of the skull through the vertex without intravenous contrast.  RADIATION DOSE REDUCTION: This exam was performed according to the departmental dose-optimization program which includes automated exposure control, adjustment of the mA and/or kV according to patient size and/or use of iterative reconstruction technique.  COMPARISON:  CT head 08/20/2022  FINDINGS: Brain: There is no acute intracranial hemorrhage, extra-axial fluid collection, or acute infarct  Parenchymal volume is stable and within normal limits. The ventricles are stable in size. Patchy hypodensity in the supratentorial white matter is unchanged, likely reflecting background chronic small-vessel ischemic change  There is no mass lesion. There is no mass effect or midline shift. The pituitary and suprasellar region are normal.  Vascular: No hyperdense vessel or unexpected calcification.  Skull: Normal. Negative for fracture or focal lesion.  Sinuses/Orbits: The imaged paranasal sinuses are clear. The imaged globes and orbits are unremarkable.  Other: None.  IMPRESSION: Stable noncontrast head CT with no acute intracranial pathology.  Electronically Signed   By: Valetta Mole M.D.   On: 08/26/2022 11:47 Note: Reviewed        Physical Exam  General appearance: Well nourished, well developed, and well hydrated. In no  apparent acute distress Mental status: Alert, oriented x 3 (person, place, & time)       Respiratory: No evidence of acute respiratory distress Eyes: PERLA Vitals: LMP 08/13/1995  BMI: Estimated body mass index is 23.4 kg/m as calculated from the following:   Height as of 10/15/22: 5\' 6"  (1.676 m).   Weight as of 10/15/22: 145 lb (65.8 kg). Ideal: Patient weight not recorded  Assessment   Diagnosis Status  No diagnosis found. Controlled Controlled Controlled   Updated Problems: No problems updated.  Plan of Care  Problem-specific:  No problem-specific Assessment & Plan notes found for this encounter.  Ms. Samaree Day Lambie has a current medication list which includes the following long-term medication(s): potassium chloride, amlodipine, atenolol, gabapentin, hydrochlorothiazide, lisinopril, rosuvastatin, sulfasalazine, and tramadol.  Pharmacotherapy (Medications Ordered): No orders of the defined types were placed in this encounter.  Orders:  No orders of the defined types were placed in this encounter.  Follow-up plan:   No follow-ups on file.      Interventional Therapies  Risk  Complexity Considerations:  Allergy: Lyrica, Dilaudid Note: Does not want SCS    Planned  Pending:   Therapeutic right genicular nerve RFA #2    Under consideration:   Diagnostic right Lumbar spinal cord stimulator trial (Does not want.)   Completed:   Diagnostic right genicular NB x1 (01/17/2021) (100/100/0/0)  Therapeutic right genicular nerve RFA x1 (03/07/2021) (100/100/100/90)    Therapeutic  Palliative (PRN) options:   Palliative right genicular nerve RFA         Recent Visits Date Type Provider Dept  09/08/22 Office Visit Milinda Pointer, MD [facility]  Showing recent visits within past 90 days and meeting all other requirements Future Appointments Date Type Provider Dept  11/03/22 Appointment Milinda Pointer, MD [facility]  Showing future  appointments within next 90 days and meeting all other requirements  I discussed the assessment and treatment plan with the patient. The patient was provided an opportunity to ask questions and all were answered. The patient agreed with the plan and demonstrated an understanding of the instructions.  Patient advised to call back or seek an in-person evaluation if the symptoms or condition worsens.  Duration of encounter: *** minutes.  Total time on encounter, as per AMA guidelines included both the face-to-face and non-face-to-face time personally spent by the physician and/or other qualified health care professional(s) on the day of the encounter (includes time in activities that require the physician or other qualified health care professional and does not include time in activities normally performed by clinical staff). Physician's time may include the following activities when performed: Preparing to see the patient (e.g., pre-charting review of records, searching for previously ordered imaging, lab work, and nerve conduction tests) Review of prior analgesic pharmacotherapies. Reviewing PMP Interpreting ordered tests (e.g., lab work, imaging, nerve conduction tests) Performing post-procedure evaluations, including interpretation of diagnostic procedures Obtaining and/or reviewing separately obtained history Performing a medically appropriate examination and/or evaluation Counseling and educating the patient/family/caregiver Ordering medications, tests, or procedures Referring and communicating with other health care professionals (when not separately reported) Documenting clinical information in the electronic or other health record Independently interpreting results (not separately reported) and communicating results to the patient/ family/caregiver Care coordination (not separately reported)  Note by: Gaspar Cola, MD Date: 11/03/2022; Time: 6:36 PM

## 2022-11-03 ENCOUNTER — Encounter: Payer: Self-pay | Admitting: Pain Medicine

## 2022-11-03 ENCOUNTER — Ambulatory Visit: Payer: Medicare HMO | Attending: Pain Medicine | Admitting: Pain Medicine

## 2022-11-03 VITALS — BP 137/63 | HR 50 | Temp 97.0°F | Resp 16 | Ht 66.0 in | Wt 142.0 lb

## 2022-11-03 DIAGNOSIS — G894 Chronic pain syndrome: Secondary | ICD-10-CM | POA: Insufficient documentation

## 2022-11-03 DIAGNOSIS — Z79891 Long term (current) use of opiate analgesic: Secondary | ICD-10-CM | POA: Diagnosis not present

## 2022-11-03 DIAGNOSIS — M25561 Pain in right knee: Secondary | ICD-10-CM | POA: Diagnosis not present

## 2022-11-03 DIAGNOSIS — Z79899 Other long term (current) drug therapy: Secondary | ICD-10-CM | POA: Insufficient documentation

## 2022-11-03 DIAGNOSIS — G8929 Other chronic pain: Secondary | ICD-10-CM | POA: Insufficient documentation

## 2022-11-03 DIAGNOSIS — Z96651 Presence of right artificial knee joint: Secondary | ICD-10-CM | POA: Diagnosis not present

## 2022-11-03 MED ORDER — TRAMADOL HCL 50 MG PO TABS: 50.0000 mg | ORAL_TABLET | Freq: Two times a day (BID) | ORAL | 5 refills | Status: DC

## 2022-11-03 MED ORDER — NALOXONE HCL 4 MG/0.1ML NA LIQD: 1.0000 | NASAL | 0 refills | Status: DC | PRN

## 2022-11-03 NOTE — Patient Instructions (Signed)
____________________________________________________________________________________________  Opioid Pain Medication Update  To: All patients taking opioid pain medications. (I.e.: hydrocodone, hydromorphone, oxycodone, oxymorphone, morphine, codeine, methadone, tapentadol, tramadol, buprenorphine, fentanyl, etc.)  Re: Updated review of side effects and adverse reactions of opioid analgesics, as well as new information about long term effects of this class of medications.  Direct risks of long-term opioid therapy are not limited to opioid addiction and overdose. Potential medical risks include serious fractures, breathing problems during sleep, hyperalgesia, immunosuppression, chronic constipation, bowel obstruction, myocardial infarction, and tooth decay secondary to xerostomia.  Unpredictable adverse effects that can occur even if you take your medication correctly: Cognitive impairment, respiratory depression, and death. Most people think that if they take their medication "correctly", and "as instructed", that they will be safe. Nothing could be farther from the truth. In reality, a significant amount of recorded deaths associated with the use of opioids has occurred in individuals that had taken the medication for a long time, and were taking their medication correctly. The following are examples of how this can happen: Patient taking his/her medication for a long time, as instructed, without any side effects, is given a certain antibiotic or another unrelated medication, which in turn triggers a "Drug-to-drug interaction" leading to disorientation, cognitive impairment, impaired reflexes, respiratory depression or an untoward event leading to serious bodily harm or injury, including death.  Patient taking his/her medication for a long time, as instructed, without any side effects, develops an acute impairment of liver and/or kidney function. This will lead to a rapid inability of the body to  breakdown and eliminate their pain medication, which will result in effects similar to an "overdose", but with the same medicine and dose that they had always taken. This again may lead to disorientation, cognitive impairment, impaired reflexes, respiratory depression or an untoward event leading to serious bodily harm or injury, including death.  A similar problem will occur with patients as they grow older and their liver and kidney function begins to decrease as part of the aging process.  Background information: Historically, the original case for using long-term opioid therapy to treat chronic noncancer pain was based on safety assumptions that subsequent experience has called into question. In 1996, the American Pain Society and the [place] [employer] issued a consensus statement supporting long-term opioid therapy. This statement acknowledged the dangers of opioid prescribing but concluded that the risk for addiction was low; respiratory depression induced by opioids was short-lived, occurred mainly in opioid-naive patients, and was antagonized by pain; tolerance was not a common problem; and efforts to control diversion should not constrain opioid prescribing. This has now proven to be wrong. Experience regarding the risks for opioid addiction, misuse, and overdose in community practice has failed to support these assumptions.  According to the Centers for Disease Control and Prevention, fatal overdoses involving opioid analgesics have increased sharply over the past decade. Currently, more than 96,700 people die from drug overdoses every year. Opioids are a factor in 7 out of every 10 overdose deaths. Deaths from drug overdose have surpassed motor vehicle accidents as the leading cause of death for individuals between the ages of 3 and 36.  Clinical data suggest that neuroendocrine dysfunction may be very common in both men and women, potentially causing hypogonadism, erectile  dysfunction, infertility, decreased libido, osteoporosis, and depression. Recent studies linked higher opioid dose to increased opioid-related mortality. Controlled observational studies reported that long-term opioid therapy may be associated with increased risk for cardiovascular events. Subsequent meta-analysis concluded  that the safety of long-term opioid therapy in elderly patients has not been proven.   Side Effects and adverse reactions: Common side effects: Drowsiness (sedation). Dizziness. Nausea and vomiting. Constipation. Physical dependence -- Dependence often manifests with withdrawal symptoms when opioids are discontinued or decreased. Tolerance -- As you take repeated doses of opioids, you require increased medication to experience the same effect of pain relief. Respiratory depression -- This can occur in healthy people, especially with higher doses. However, people with COPD, asthma or other lung conditions may be even more susceptible to fatal respiratory impairment.  Uncommon side effects: An increased sensitivity to feeling pain and extreme response to pain (hyperalgesia). Chronic use of opioids can lead to this. Delayed gastric emptying (the process by which the contents of your stomach are moved into your small intestine). Muscle rigidity. Immune system and hormonal dysfunction. Quick, involuntary muscle jerks (myoclonus). Arrhythmia. Itchy skin (pruritus). Dry mouth (xerostomia).  Long-term side effects: Chronic constipation. Sleep-disordered breathing (SDB). Increased risk of bone fractures. Hypothalamic-pituitary-adrenal dysregulation. Increased risk of overdose.  RISKS: Fractures and Falls:  Opioids increase the risk and incidence of falls. This is of particular importance in elderly patients.  Endocrine System:  Long-term administration is associated with endocrine abnormalities (endocrinopathies). (Also known as Opioid-induced Endocrinopathy) Influences  on both the hypothalamic-pituitary-adrenal axis?and the hypothalamic-pituitary-gonadal axis have been demonstrated with consequent hypogonadism and adrenal insufficiency in both sexes. Hypogonadism and decreased levels of dehydroepiandrosterone sulfate have been reported in men and women. Endocrine effects include: Amenorrhoea in women (abnormal absence of menstruation) Reduced libido in both sexes Decreased sexual function Erectile dysfunction in men Hypogonadisms (decreased testicular function with shrinkage of testicles) Infertility Depression and fatigue Loss of muscle mass Anxiety Depression Immune suppression Hyperalgesia Weight gain Anemia Osteoporosis Patients (particularly women of childbearing age) should avoid opioids. There is insufficient evidence to recommend routine monitoring of asymptomatic patients taking opioids in the long-term for hormonal deficiencies.  Immune System: Human studies have demonstrated that opioids have an immunomodulating effect. These effects are mediated via opioid receptors both on immune effector cells and in the central nervous system. Opioids have been demonstrated to have adverse effects on antimicrobial response and anti-tumour surveillance. Buprenorphine has been demonstrated to have no impact on immune function.  Opioid Induced Hyperalgesia: Human studies have demonstrated that prolonged use of opioids can lead to a state of abnormal pain sensitivity, sometimes called opioid induced hyperalgesia (OIH). Opioid induced hyperalgesia is not usually seen in the absence of tolerance to opioid analgesia. Clinically, hyperalgesia may be diagnosed if the patient on long-term opioid therapy presents with increased pain. This might be qualitatively and anatomically distinct from pain related to disease progression or to breakthrough pain resulting from development of opioid tolerance. Pain associated with hyperalgesia tends to be more diffuse than the  pre-existing pain and less defined in quality. Management of opioid induced hyperalgesia requires opioid dose reduction.  Cancer: Chronic opioid therapy has been associated with an increased risk of cancer among noncancer patients with chronic pain. This association was more evident in chronic strong opioid users. Chronic opioid consumption causes significant pathological changes in the small intestine and colon. Epidemiological studies have found that there is a link between opium dependence and initiation of gastrointestinal cancers. Cancer is the second leading cause of death after cardiovascular disease. Chronic use of opioids can cause multiple conditions such as GERD, immunosuppression and renal damage as well as carcinogenic effects, which are associated with the incidence of cancers.   Mortality: Long-term opioid use   has been associated with increased mortality among patients with chronic non-cancer pain (CNCP).  Prescription of long-acting opioids for chronic noncancer pain was associated with a significantly increased risk of all-cause mortality, including deaths from causes other than overdose.  Reference: Von Korff M, Kolodny A, Deyo RA, Chou R. Long-term opioid therapy reconsidered. Ann Intern Med. 2011 Sep 6;155(5):325-8. doi: 10.7326/[id number]. PMID: [id number]; PMCIDXX:1631110. Morley Kos, Hayward RA, Dunn KM, [place] KP. Risk of adverse events in patients prescribed long-term opioids: A cohort study in the [place] Clinical Practice Research [employer]. Eur J Pain. 2019 May;23(5):908-922. doi: 10.1002/ejp.1357. Epub 2019 Jan 31. PMID: [id number]. Colameco S, Coren JS, Ciervo CA. Continuous opioid treatment for chronic noncancer pain: a time for moderation in prescribing. Postgrad Med. 2009 Jul;121(4):61-6. doi: 10.3810/pgm.2009.07.2032. PMID: [id number]. Heywood Bene RN, [place] SD, Blazina I, Rosalio Loud, Bougatsos C, Deyo RA. The  effectiveness and risks of long-term opioid therapy for chronic pain: a systematic review for a [employer] of Health Pathways to [employer]. Ann Intern Med. 2015 Feb 17;162(4):276-86. doi: [id number]. PMID: [id number]. Marjory Sneddon [facility], [place] DM. NCHS Data Brief No. 22. [place]: Centers for Disease Control and Prevention; 2009. Sep, Increase in Fatal Poisonings Involving Opioid Analgesics in the [place], 1999-2006. Song IA, Choi HR, Oh TK. Long-term opioid use and mortality in patients with chronic non-cancer pain: Ten-year follow-up study in [place] from 2010 through 2019. EClinicalMedicine. 2022 Jul 18;51:101558. doi: 10.1016/j.eclinm.2022.UB:5887891. PMID: [id number]; PMCIDOX:8550940. Huser, W., Schubert, T., Vogelmann, T. et al. All-cause mortality in patients with long-term opioid therapy compared with non-opioid analgesics for chronic non-cancer pain: a database study. [place] Med 18, 162 (2020). [email or web address] Rashidian H, Roxy Cedar, Malekzadeh R, Haghdoost AA. An Ecological Study of the Association between Opiate Use and Incidence of Cancers. Addict Health. 2016 Fall;8(4):252-260. PMID: [id number]; PMCIDQI:9185013.  Our Goal: Our goal is to control your pain with means other than the use of opioid pain medications.  Our Recommendation: Talk to your physician about coming off of these medications. We can assist you with the tapering down and stopping these medicines. Based on the new information, even if you cannot completely stop the medication, a decrease in the dose may be associated with a lesser risk. Ask for other means of controlling the pain. Decrease or eliminate those factors that significantly contribute to your pain such as smoking, obesity, and a diet heavily tilted towards "inflammatory" nutrients.  Last Updated: 10/08/2022    ____________________________________________________________________________________________     ____________________________________________________________________________________________  Patient Information update  To: All of our patients.  Re: Name change.  It has been made official that our current name, "[facility]"   will soon be changed to "[facility]".   The purpose of this change is to eliminate any confusion created by the concept of our practice being a "Medication Management Pain Clinic". In the past this has led to the misconception that we treat pain primarily by the use of prescription medications.  Nothing can be farther from the truth.   Understanding PAIN MANAGEMENT: To further understand what our practice does, you first have to understand that "Pain Management" is a subspecialty that requires additional training once a physician has completed their specialty training, which can be in either Anesthesia, Neurology, Psychiatry, or Physical Medicine and Rehabilitation (PMR). Each one of these contributes to the final approach taken by each physician to  the management of their patient's pain. To be a "Pain Management Specialist" you must have first completed one of the specialty trainings below.  Anesthesiologists - trained in clinical pharmacology and interventional techniques such as nerve blockade and regional as well as central neuroanatomy. They are trained to block pain before, during, and after surgical interventions.  Neurologists - trained in the diagnosis and pharmacological treatment of complex neurological conditions, such as Multiple Sclerosis, Parkinson's, spinal cord injuries, and other systemic conditions that may be associated with symptoms that may include but are not limited to pain. They tend to rely primarily on the treatment of chronic pain  using prescription medications.  Psychiatrist - trained in conditions affecting the psychosocial wellbeing of patients including but not limited to depression, anxiety, schizophrenia, personality disorders, addiction, and other substance use disorders that may be associated with chronic pain. They tend to rely primarily on the treatment of chronic pain using prescription medications.   Physical Medicine and Rehabilitation (PMR) physicians, also known as physiatrists - trained to treat a wide variety of medical conditions affecting the brain, spinal cord, nerves, bones, joints, ligaments, muscles, and tendons. Their training is primarily aimed at treating patients that have suffered injuries that have caused severe physical impairment. Their training is primarily aimed at the physical therapy and rehabilitation of those patients. They may also work alongside orthopedic surgeons or neurosurgeons using their expertise in assisting surgical patients to recover after their surgeries.  INTERVENTIONAL PAIN MANAGEMENT is sub-subspecialty of Pain Management.  Our physicians are Board-certified in Anesthesia, Pain Management, and Interventional Pain Management.  This meaning that not only have they been trained and Board-certified in their specialty of Anesthesia, and subspecialty of Pain Management, but they have also received further training in the sub-subspecialty of Interventional Pain Management, in order to become Board-certified as INTERVENTIONAL PAIN MANAGEMENT SPECIALIST.    Mission: Our goal is to use our skills in  [place] as alternatives to the chronic use of prescription opioid medications for the treatment of pain. To make this more clear, we have changed our name to reflect what we do and offer. We will continue to offer medication management assessment and recommendations, but we will not be taking over any patient's medication  management.  ____________________________________________________________________________________________     ____________________________________________________________________________________________  National Pain Medication Shortage  The [place] is experiencing worsening drug shortages. These have had a negative widespread effect on patient care and treatment. Not expected to improve any time soon. Predicted to last past 2029.   Drug shortage list (generic names) Oxycodone IR Oxycodone/APAP Oxymorphone IR Hydromorphone Hydrocodone/APAP Morphine  Where is the problem?  Manufacturing and supply level.  Will this shortage affect you?  Only if you take any of the above pain medications.  How? You may be unable to fill your prescription.  Your pharmacist may offer a "partial fill" of your prescription. (Warning: Do not accept partial fills.) Prescriptions partially filled cannot be transferred to another pharmacy. Read our Medication Rules and Regulation. Depending on how much medicine you are dependent on, you may experience withdrawals when unable to get the medication.  Recommendations: Consider ending your dependence on opioid pain medications. Ask your pain specialist to assist you with the process. Consider switching to a medication currently not in shortage, such as Buprenorphine. Talk to your pain specialist about this option. Consider decreasing your pain medication requirements by managing tolerance thru "Drug Holidays". This may help minimize withdrawals, should you run out of medicine. Control your pain thru  the use of non-pharmacological interventional therapies.   Your prescriber: Prescribers cannot be blamed for shortages. Medication manufacturing and supply issues cannot be fixed by the prescriber.   NOTE: The prescriber is not responsible for supplying the medication, or solving supply issues. Work with your pharmacist to solve it. The patient is responsible for  the decision to take or continue taking the medication and for identifying and securing a legal supply source. By law, supplying the medication is the job and responsibility of the pharmacy. The prescriber is responsible for the evaluation, monitoring, and prescribing of these medications.   Prescribers will NOT: Re-issue prescriptions that have been partially filled. Re-issue prescriptions already sent to a pharmacy.  Re-send prescriptions to a different pharmacy because yours did not have your medication. Ask pharmacist to order more medicine or transfer the prescription to another pharmacy. (Read below.)  New 2023 regulation: "April 11, 2022 Revised Regulation Allows DEA-Registered Pharmacies to Transfer Electronic Prescriptions at a Patient's Request [place] Patients now have the ability to request their electronic prescription be transferred to another pharmacy without having to go back to their practitioner to initiate the request. This revised regulation went into effect on Monday, April 07, 2022.     At a patient's request, a DEA-registered retail pharmacy can now transfer an electronic prescription for a controlled substance (schedules II-V) to another DEA-registered retail pharmacy. Prior to this change, patients would have to go through their practitioner to cancel their prescription and have it re-issued to a different pharmacy. The process was taxing and time consuming for both patients and practitioners.    The Drug Enforcement Administration [facility]) published its intent to revise the process for transferring electronic prescriptions on June 29, 2020.  The final rule was published in the federal register on March 06, 2022 and went into effect 30 days later.  Under the final rule, a prescription can only be transferred once between pharmacies, and only if allowed under existing state or other applicable law. The prescription must  remain in its electronic form; may not be altered in any way; and the transfer must be communicated directly between two licensed pharmacists. It's important to note, any authorized refills transfer with the original prescription, which means the entire prescription will be filled at the same pharmacy".  Reference: [employer]at [facility] website announcement)  [email or web address].pdf ([place])   [employer] / Vol. 88, No. 143 / Thursday, March 06, 2022 / Rules and Regulations DEPARTMENT OF JUSTICE  Drug Enforcement Administration  21 CFR Part 1306  [Docket No. [id number]]  RIN [id number] Transfer of Electronic Prescriptions for Schedules II-V Controlled Substances Between Pharmacies for Initial Filling  ____________________________________________________________________________________________     ____________________________________________________________________________________________  Transfer of Pain Medication between Pharmacies  Re: 2023 DEA Clarification on existing regulation  Published on DEA Website: April 11, 2022  Title: Revised Regulation Allows DEA-Registered Pharmacies to Conservator, museum/gallery Prescriptions at a Patient's Request [place]  "Patients now have the ability to request their electronic prescription be transferred to another pharmacy without having to go back to their practitioner to initiate the request. This revised regulation went into effect on Monday, April 07, 2022.     At a patient's request, a DEA-registered retail pharmacy can now transfer an electronic prescription for a controlled substance (schedules II-V) to another DEA-registered retail pharmacy. Prior to this change, patients would have to go through their practitioner to  cancel their prescription  and have it re-issued to a different pharmacy. The process was taxing and time consuming for both patients and practitioners.    The Drug Enforcement Administration (DEA) published its intent to revise the process for transferring electronic prescriptions on June 29, 2020.  The final rule was published in the federal register on March 06, 2022 and went into effect 30 days later.  Under the final rule, a prescription can only be transferred once between pharmacies, and only if allowed under existing state or other applicable law. The prescription must remain in its electronic form; may not be altered in any way; and the transfer must be communicated directly between two licensed pharmacists. It's important to note, any authorized refills transfer with the original prescription, which means the entire prescription will be filled at the same pharmacy."    REFERENCES: 1. DEA website announcement https://www.dea.gov/stories/2023/2023-04/2022-09-01/revised-regulation-allows-dea-registered-pharmacies-transfer  2. Department of Justice website  https://www.govinfo.gov/content/pkg/FR-2022-03-06/pdf/2023-15847.pdf  3. DEPARTMENT OF JUSTICE Drug Enforcement Administration 21 CFR Part 1306 [Docket No. [id number]] RIN [id number] "Transfer of Electronic Prescriptions for Schedules II-V Controlled Substances Between Pharmacies for Initial Filling"  ____________________________________________________________________________________________     _______________________________________________________________________  Medication Rules  Purpose: To inform patients, and their family members, of our medication rules and regulations.  Applies to: All patients receiving prescriptions from our practice (written or electronic).  Pharmacy of record: This is the pharmacy where your electronic prescriptions will be sent. Make sure we have the correct one.  Electronic prescriptions: In  compliance with the [place] Strengthen Opioid Misuse Prevention (STOP) Act of 2017 (Session Law 2017-74/H243), effective August 11, 2018, all controlled substances must be electronically prescribed. Written prescriptions, faxing, or calling prescriptions to a pharmacy will no longer be done.  Prescription refills: These will be provided only during in-person appointments. No medications will be renewed without a "face-to-face" evaluation with your provider. Applies to all prescriptions.  NOTE: The following applies primarily to controlled substances (Opioid* Pain Medications).   Type of encounter (visit): For patients receiving controlled substances, face-to-face visits are required. (Not an option and not up to the patient.)  Patient's responsibilities: Pain Pills: Bring all pain pills to every appointment (except for procedure appointments). Pill Bottles: Bring pills in original pharmacy bottle. Bring bottle, even if empty. Always bring the bottle of the most recent fill.  Medication refills: You are responsible for knowing and keeping track of what medications you are taking and when is it that you will need a refill. The day before your appointment: write a list of all prescriptions that need to be refilled. The day of the appointment: give the list to the admitting nurse. Prescriptions will be written only during appointments. No prescriptions will be written on procedure days. If you forget a medication: it will not be "Called in", "Faxed", or "electronically sent". You will need to get another appointment to get these prescribed. No early refills. Do not call asking to have your prescription filled early. Partial  or short prescriptions: Occasionally your pharmacy may not have enough pills to fill your prescription.  NEVER ACCEPT a partial fill or a prescription that is short of the total amount of pills that you were prescribed.  With controlled substances the law allows 72 hours for  the pharmacy to complete the prescription.  If the prescription is not completed within 72 hours, the pharmacist will require a new prescription to be written. This means that you will be short on your medicine and we WILL NOT send another prescription to complete your original   prescription.  Instead, request the pharmacy to send a carrier to a nearby branch to get enough medication to provide you with your full prescription. Prescription Accuracy: You are responsible for carefully inspecting your prescriptions before leaving our office. Have the discharge nurse carefully go over each prescription with you, before taking them home. Make sure that your name is accurately spelled, that your address is correct. Check the name and dose of your medication to make sure it is accurate. Check the number of pills, and the written instructions to make sure they are clear and accurate. Make sure that you are given enough medication to last until your next medication refill appointment. Taking Medication: Take medication as prescribed. When it comes to controlled substances, taking less pills or less frequently than prescribed is permitted and encouraged. Never take more pills than instructed. Never take the medication more frequently than prescribed.  Inform other Doctors: Always inform, all of your healthcare providers, of all the medications you take. Pain Medication from other Providers: You are not allowed to accept any additional pain medication from any other Doctor or Healthcare provider. There are two exceptions to this rule. (see below) In the event that you require additional pain medication, you are responsible for notifying us, as stated below. Cough Medicine: Often these contain an opioid, such as codeine or hydrocodone. Never accept or take cough medicine containing these opioids if you are already taking an opioid* medication. The combination may cause respiratory failure and death. Medication Agreement:  You are responsible for carefully reading and following our Medication Agreement. This must be signed before receiving any prescriptions from our practice. Safely store a copy of your signed Agreement. Violations to the Agreement will result in no further prescriptions. (Additional copies of our Medication Agreement are available upon request.) Laws, Rules, & Regulations: All patients are expected to follow all Federal and [employer], [employer], Rules, [facility]. Ignorance of the Laws does not constitute a valid excuse.  Illegal drugs and Controlled Substances: The use of illegal substances (including, but not limited to marijuana and its derivatives) and/or the illegal use of any controlled substances is strictly prohibited. Violation of this rule may result in the immediate and permanent discontinuation of any and all prescriptions being written by our practice. The use of any illegal substances is prohibited. Adopted CDC guidelines & recommendations: Target dosing levels will be at or below 60 MME/day. Use of benzodiazepines** is not recommended.  Exceptions: There are only two exceptions to the rule of not receiving pain medications from other Healthcare Providers. Exception #1 (Emergencies): In the event of an emergency (i.e.: accident requiring emergency care), you are allowed to receive additional pain medication. However, you are responsible for: As soon as you are able, call our office [phone], at any time of the day or night, and leave a message stating your name, the date and nature of the emergency, and the name and dose of the medication prescribed. In the event that your call is answered by a member of our staff, make sure to document and save the date, time, and the name of the person that took your information.  Exception #2 (Planned Surgery): In the event that you are scheduled by another doctor or dentist to have any type of surgery or procedure, you are allowed (for a period no  longer than 30 days), to receive additional pain medication, for the acute post-op pain. However, in this case, you are responsible for picking up a copy of  our "Post-op Pain Management for Surgeons" handout, and giving it to your surgeon or dentist. This document is available at our office, and does not require an appointment to obtain it. Simply go to our office during business hours (Monday-Thursday from 8:00 AM to 4:00 PM) (Friday 8:00 AM to 12:00 Noon) or if you have a scheduled appointment with [place], prior to your surgery, and ask for it by name. In addition, you are responsible for: calling our office [phone], at any time of the day or night, and leaving a message stating your name, name of your surgeon, type of surgery, and date of procedure or surgery. Failure to comply with your responsibilities may result in termination of therapy involving the controlled substances. Medication Agreement Violation. Following the above rules, including your responsibilities will help you in avoiding a Medication Agreement Violation ("Breaking your Pain Medication Contract").  Consequences:  Not following the above rules may result in permanent discontinuation of medication prescription therapy.  *Opioid medications include: morphine, codeine, oxycodone, oxymorphone, hydrocodone, hydromorphone, meperidine, tramadol, tapentadol, buprenorphine, fentanyl, methadone. **Benzodiazepine medications include: diazepam (Valium), alprazolam (Xanax), clonazepam (Klonopine), lorazepam (Ativan), clorazepate (Tranxene), chlordiazepoxide (Librium), estazolam (Prosom), oxazepam (Serax), temazepam (Restoril), triazolam (Halcion) (Last updated: 06/03/2022) ______________________________________________________________________    ______________________________________________________________________  Medication Recommendations and Reminders  Applies to: All patients receiving prescriptions (written and/or  electronic).  Medication Rules & Regulations: You are responsible for reading, knowing, and following our "Medication Rules" document. These exist for your safety and that of others. They are not flexible and neither are we. Dismissing or ignoring them is an act of "non-compliance" that may result in complete and irreversible termination of such medication therapy. For safety reasons, "non-compliance" will not be tolerated. As with the [place] fundamental legal principle of "ignorance of the law is no defense", we will accept no excuses for not having read and knowing the content of documents provided to you by our practice.  Pharmacy of record:  Definition: This is the pharmacy where your electronic prescriptions will be sent.  We do not endorse any particular pharmacy. It is up to you and your insurance to decide what pharmacy to use.  We do not restrict you in your choice of pharmacy. However, once we write for your prescriptions, we will NOT be re-sending more prescriptions to fix restricted supply problems created by your pharmacy, or your insurance.  The pharmacy listed in the electronic medical record should be the one where you want electronic prescriptions to be sent. If you choose to change pharmacy, simply notify our nursing staff. Changes will be made only during your regular appointments and not over the phone.  Recommendations: Keep all of your pain medications in a safe place, under lock and key, even if you live alone. We will NOT replace lost, stolen, or damaged medication. We do not accept "Police Reports" as proof of medications having been stolen. After you fill your prescription, take 1 week's worth of pills and put them away in a safe place. You should keep a separate, properly labeled bottle for this purpose. The remainder should be kept in the original bottle. Use this as your primary supply, until it runs out. Once it's gone, then you know that you have 1 week's worth of medicine,  and it is time to come in for a prescription refill. If you do this correctly, it is unlikely that you will ever run out of medicine. To make sure that the above recommendation works, it is very important that you make  sure your medication refill appointments are scheduled at least 1 week before you run out of medicine. To do this in an effective manner, make sure that you do not leave the office without scheduling your next medication management appointment. Always ask the nursing staff to show you in your prescription , when your medication will be running out. Then arrange for the receptionist to get you a return appointment, at least 7 days before you run out of medicine. Do not wait until you have 1 or 2 pills left, to come in. This is very poor planning and does not take into consideration that we may need to cancel appointments due to bad weather, sickness, or emergencies affecting our staff. DO NOT ACCEPT A "Partial Fill": If for any reason your pharmacy does not have enough pills/tablets to completely fill or refill your prescription, do not allow for a "partial fill". The law allows the pharmacy to complete that prescription within 72 hours, without requiring a new prescription. If they do not fill the rest of your prescription within those 72 hours, you will need a separate prescription to fill the remaining amount, which we will NOT provide. If the reason for the partial fill is your insurance, you will need to talk to the pharmacist about payment alternatives for the remaining tablets, but again, DO NOT ACCEPT A PARTIAL FILL, unless you can trust your pharmacist to obtain the remainder of the pills within 72 hours.  Prescription refills and/or changes in medication(s):  Prescription refills, and/or changes in dose or medication, will be conducted only during scheduled medication management appointments. (Applies to both, written and electronic prescriptions.) No refills on procedure days. No  medication will be changed or started on procedure days. No changes, adjustments, and/or refills will be conducted on a procedure day. Doing so will interfere with the diagnostic portion of the procedure. No phone refills. No medications will be "called into the pharmacy". No Fax refills. No weekend refills. No Holliday refills. No after hours refills.  Remember:  Business hours are:  Monday to Thursday 8:00 AM to 4:00 PM Provider's Schedule: Milinda Pointer, MD - Appointments are:  Medication management: Monday and Wednesday 8:00 AM to 4:00 PM Procedure day: Tuesday and Thursday 7:30 AM to 4:00 PM Gillis Santa, MD - Appointments are:  Medication management: Tuesday and Thursday 8:00 AM to 4:00 PM Procedure day: Monday and Wednesday 7:30 AM to 4:00 PM (Last update: 06/03/2022) ______________________________________________________________________    ____________________________________________________________________________________________  Drug Holidays  What is a "Drug Holiday"? Drug Holiday: is the name given to the process of slowly tapering down and temporarily stopping the pain medication for the purpose of decreasing or eliminating tolerance to the drug.  Benefits Improved effectiveness Decreased required effective dose Improved pain control End dependence on high dose therapy Decrease cost of therapy Uncovering "opioid-induced hyperalgesia". (OIH)  What is "opioid hyperalgesia"? It is a paradoxical increase in pain caused by exposure to opioids. Stopping the opioid pain medication, contrary to the expected, it actually decreases or completely eliminates the pain. Ref.: "A comprehensive review of opioid-induced hyperalgesia". Brion Aliment, et.al. Pain Physician. 2011 Mar-Apr;14(2):145-61.  What is tolerance? Tolerance: the progressive loss of effectiveness of a pain medicine due to repetitive use. A common problem of opioid pain medications.  How long should a "Drug  Holiday" last? Effectiveness depends on the patient staying off all opioid pain medicines for a minimum of 14 consecutive days. (2 weeks)  How about just taking less of the medicine? Does not  work. Will not accomplish goal of eliminating the excess receptors.  How about switching to a different pain medicine? (AKA. "Opioid rotation") Does not work. Creates the illusion of effectiveness by taking advantage of inaccurate equivalent dose calculations between different opioids. -This "technique" was promoted by studies funded by [employer], such as [employer], creators of "OxyContin".  Can I stop the medicine "cold [place]"? We do not recommend it. You should always coordinate with your prescribing physician to make the transition as smoothly as possible. Avoid stopping the medicine abruptly without consulting. We recommend a "slow taper".  What is a slow taper? Taper: refers to the gradual decrease in dose.   How do I stop/taper the dose? Slowly. Decrease the daily amount of pills that you take by one (1) pill every seven (7) days. This is called a "slow downward taper". Example: if you normally take four (4) pills per day, drop it to three (3) pills per day for seven (7) days, then to two (2) pills per day for seven (7) days, then to one (1) per day for seven (7) days, and then stop the medicine. The 14 day "Drug Holiday" starts on the first day without medicine.   Will I experience withdrawals? Unlikely with a slow taper.  What triggers withdrawals? Withdrawals are triggered by the sudden/abrupt stop of high dose opioids. Withdrawals can be avoided by slowly decreasing the dose over a prolonged period of time.  What are withdrawals? Symptoms associated with sudden/abrupt reduction/stopping of high-dose, long-term use of pain medication. Withdrawal are seldom seen on low dose therapy, or patients rarely taking opioid medication.  Early Withdrawal Symptoms may  include: Agitation Anxiety Muscle aches Increased tearing Insomnia Runny nose Sweating Yawning  Late symptoms may include: Abdominal cramping Diarrhea Dilated pupils Goose bumps Nausea Vomiting  When could I see withdrawals? Onset: 8-24 hours after last use for most opioids. 12-48 hours for long-acting opioids (i.e.: methadone)  How long could they last? Duration: 4-10 days for most opioids. 14-21 days for long-acting opioids (i.e.: methadone)  What will happen after I complete my "Drug Holiday"? The need and indications for the opioid analgesic will be reviewed before restarting the medication. Dose requirements will likely decrease and the dose will need to be adjusted accordingly.   (Last update: 10/29/2022) ____________________________________________________________________________________________    ____________________________________________________________________________________________  WARNING: CBD (cannabidiol) & Delta (Delta-8 tetrahydrocannabinol) products.   Applicable to:  All individuals currently taking or considering taking CBD (cannabidiol) and, more important, all patients taking opioid analgesic controlled substances (pain medication). (Example: oxycodone; oxymorphone; hydrocodone; hydromorphone; morphine; methadone; tramadol; tapentadol; fentanyl; buprenorphine; butorphanol; dextromethorphan; meperidine; codeine; etc.)  Introduction:  Recently there has been a drive towards the use of "natural" products for the treatment of different conditions, including pain anxiety and sleep disorders. Marijuana and hemp are two varieties of the cannabis genus plants. Marijuana and its derivatives are illegal, while hemp and its derivatives are not. Cannabidiol (CBD) and tetrahydrocannabinol (THC), are two natural compounds found in plants of the Cannabis genus. They can both be extracted from hemp or marijuana. Both compounds interact with your body's endocannabinoid  system in very different ways. CBD is associated with pain relief (analgesia) while THC is associated with the psychoactive effects ("the high") obtained from the use of marijuana products. There are two main types of THC: Delta-9, which comes from the marijuana plant and it is illegal, and Delta-8, which comes from the hemp plant, and it is legal. (Both, Delta-9-THC and Delta-8-THC are psychoactive and  give you "the high".)   Legality:  Marijuana and its derivatives: illegal Hemp and its derivatives: Legal (State dependent) UPDATE: (09/27/2021) The Drug Enforcement Agency ([place]) issued a letter stating that "delta" cannabinoids, including Delta-8-THCO and Delta-9-THCO, synthetically derived from hemp do not qualify as hemp and will be viewed as Schedule I drugs. (Schedule I drugs, substances, or chemicals are defined as drugs with no currently accepted medical use and a high potential for abuse. Some examples of Schedule I drugs are: heroin, lysergic acid diethylamide (LSD), marijuana (cannabis), 3,4-methylenedioxymethamphetamine (ecstasy), methaqualone, and peyote.) (https://jennings.com/)  Legal status of CBD in [place]:  "Conditionally Legal"  Reference: "FDA Regulation of Cannabis and Cannabis-Derived Products, Including Cannabidiol (CBD)" - SeekArtists.com.pt  Warning:  CBD is not FDA approved and has not undergo the same manufacturing controls as prescription drugs.  This means that the purity and safety of available CBD may be questionable. Most of the time, despite manufacturer's claims, it is contaminated with THC (delta-9-tetrahydrocannabinol - the chemical in marijuana responsible for the "HIGH").  When this is the case, the [facility] contaminant will trigger a positive urine drug screen (UDS) test for Marijuana (carboxy-THC).   The FDA recently put out a warning about 5 things that everyone  should be aware of regarding Delta-8 THC: Delta-8 THC products have not been evaluated or approved by the FDA for safe use and may be marketed in ways that put the public health at risk. The FDA has received adverse event reports involving delta-8 THC-containing products. Delta-8 THC has psychoactive and intoxicating effects. Delta-8 THC manufacturing often involve use of potentially harmful chemicals to create the concentrations of delta-8 THC claimed in the marketplace. The final delta-8 THC product may have potentially harmful by-products (contaminants) due to the chemicals used in the process. Manufacturing of delta-8 THC products may occur in uncontrolled or unsanitary settings, which may lead to the presence of unsafe contaminants or other potentially harmful substances. Delta-8 THC products should be kept out of the reach of children and pets.  NOTE: Because a positive UDS for any illicit substance is a violation of our medication agreement, your opioid analgesics (pain medicine) may be permanently discontinued.  MORE ABOUT CBD  General Information: CBD was discovered in 80 and it is a derivative of the cannabis sativa genus plants (Marijuana and Hemp). It is one of the 113 identified substances found in Marijuana. It accounts for up to 40% of the plant's extract. As of 2018, preliminary clinical studies on CBD included research for the treatment of anxiety, movement disorders, and pain. CBD is available and consumed in multiple forms, including inhalation of smoke or vapor, as an aerosol spray, and by mouth. It may be supplied as an oil containing CBD, capsules, dried cannabis, or as a liquid solution. CBD is thought not to be as psychoactive as THC (delta-9-tetrahydrocannabinol - the chemical in marijuana responsible for the "HIGH"). Studies suggest that CBD may interact with different biological target receptors in the body, including cannabinoid and other neurotransmitter receptors. As of  2018 the mechanism of action for its biological effects has not been determined.  Side-effects  Adverse reactions: Dry mouth, diarrhea, decreased appetite, fatigue, drowsiness, malaise, weakness, sleep disturbances, and others.  Drug interactions:  CBD may interact with medications such as blood-thinners. CBD causes drowsiness on its own and it will increase drowsiness caused by other medications, including antihistamines (such as Benadryl), benzodiazepines (Xanax, Ativan, Valium), antipsychotics, antidepressants, opioids, alcohol and supplements such as kava, melatonin and St. John's Wort.  Other drug interactions: Brivaracetam (Briviact); Caffeine; Carbamazepine (Tegretol); Citalopram (Celexa); Clobazam (Onfi); Eslicarbazepine (Aptiom); Everolimus (Zostress); Lithium; Methadone (Dolophine); Rufinamide (Banzel); Sedative medications (CNS depressants); Sirolimus (Rapamune); Stiripentol (Diacomit); Tacrolimus (Prograf); Tamoxifen ; Soltamox); Topiramate (Topamax); Valproate; Warfarin (Coumadin); Zonisamide. (Last update: 07/21/2022) ____________________________________________________________________________________________   ____________________________________________________________________________________________  Naloxone Nasal Spray  Why am I receiving this medication? [place] STOP ACT requires that all patients taking high dose opioids or at risk of opioids respiratory depression, be prescribed an opioid reversal agent, such as Naloxone (AKA: Narcan).  What is this medication? NALOXONE (nal OX one) treats opioid overdose, which causes slow or shallow breathing, severe drowsiness, or trouble staying awake. Call emergency services after using this medication. You may need additional treatment. Naloxone works by reversing the effects of opioids. It belongs to a group of medications called opioid blockers.  COMMON BRAND NAME(S): Kloxxado, Narcan  What should I tell my care team before  I take this medication? They need to know if you have any of these conditions: Heart disease Substance use disorder An unusual or allergic reaction to naloxone, other medications, foods, dyes, or preservatives Pregnant or trying to get pregnant Breast-feeding  When to use this medication? This medication is to be used for the treatment of respiratory depression (less than 8 breaths per minute) secondary to opioid overdose.   How to use this medication? This medication is for use in the nose. Lay the person on their back. Support their neck with your hand and allow the head to tilt back before giving the medication. The nasal spray should be given into 1 nostril. After giving the medication, move the person onto their side. Do not remove or test the nasal spray until ready to use. Get emergency medical help right away after giving the first dose of this medication, even if the person wakes up. You should be familiar with how to recognize the signs and symptoms of a narcotic overdose. If more doses are needed, give the additional dose in the other nostril. Talk to your care team about the use of this medication in children. While this medication may be prescribed for children as young as newborns for selected conditions, precautions do apply.  Naloxone Overdosage: If you think you have taken too much of this medicine contact a poison control center or emergency room at once.  NOTE: This medicine is only for you. Do not share this medicine with others.  What if I miss a dose? This does not apply.  What may interact with this medication? This is only used during an emergency. No interactions are expected during emergency use. This list may not describe all possible interactions. Give your health care provider a list of all the medicines, herbs, non-prescription drugs, or dietary supplements you use. Also tell them if you smoke, drink alcohol, or use illegal drugs. Some items may interact with  your medicine.  What should I watch for while using this medication? Keep this medication ready for use in the case of an opioid overdose. Make sure that you have the phone number of your care team and local hospital ready. You may need to have additional doses of this medication. Each nasal spray contains a single dose. Some emergencies may require additional doses. After use, bring the treated person to the nearest hospital or call 911. Make sure the treating care team knows that the person has received a dose of this medication. You will receive additional instructions on what to do during and after use of this  medication before an emergency occurs.  What side effects may I notice from receiving this medication? Side effects that you should report to your care team as soon as possible: Allergic reactions--skin rash, itching, hives, swelling of the face, lips, tongue, or throat Side effects that usually do not require medical attention (report these to your care team if they continue or are bothersome): Constipation Dryness or irritation inside the nose Headache Increase in blood pressure Muscle spasms Stuffy nose Toothache This list may not describe all possible side effects. Call your doctor for medical advice about side effects. You may report side effects to FDA at 1-800-FDA-1088.  Where should I keep my medication? Because this is an emergency medication, you should keep it with you at all times.  Keep out of the reach of children and pets. Store between 20 and 25 degrees C (68 and 77 degrees F). Do not freeze. Throw away any unused medication after the expiration date. Keep in original box until ready to use.  NOTE: This sheet is a summary. It may not cover all possible information. If you have questions about this medicine, talk to your doctor, pharmacist, or health care provider.   2023 Elsevier/Gold Standard (2021-04-05  00:00:00)  ____________________________________________________________________________________________

## 2022-11-03 NOTE — Progress Notes (Signed)
Nursing Pain Medication Assessment:  Safety precautions to be maintained throughout the outpatient stay will include: orient to surroundings, keep bed in low position, maintain call bell within reach at all times, provide assistance with transfer out of bed and ambulation.  Medication Inspection Compliance: Pill count conducted under aseptic conditions, in front of the patient. Neither the pills nor the bottle was removed from the patient's sight at any time. Once count was completed pills were immediately returned to the patient in their original bottle.  Medication: Tramadol (Ultram) Pill/Patch Count:  22 of 60 pills remain Pill/Patch Appearance: Markings consistent with prescribed medication Bottle Appearance: Standard pharmacy container. Clearly labeled. Filled Date: 2 / 29 / 2024 Last Medication intake:  Yesterday

## 2022-11-08 ENCOUNTER — Other Ambulatory Visit: Payer: Self-pay | Admitting: Internal Medicine

## 2022-11-11 NOTE — Telephone Encounter (Signed)
Ok to send in her potassium and magnesium for 90 day supply? Confirmed directions are correct on rx.

## 2022-11-12 NOTE — Telephone Encounter (Signed)
Rx ok'd for 90 day magnesium.  Sent in 30 day potassium rx - until can see if going to need to stay on medication.

## 2022-11-17 ENCOUNTER — Ambulatory Visit (INDEPENDENT_AMBULATORY_CARE_PROVIDER_SITE_OTHER): Payer: Medicare HMO

## 2022-11-17 DIAGNOSIS — E538 Deficiency of other specified B group vitamins: Secondary | ICD-10-CM | POA: Diagnosis not present

## 2022-11-17 DIAGNOSIS — E876 Hypokalemia: Secondary | ICD-10-CM | POA: Diagnosis not present

## 2022-11-17 LAB — POTASSIUM: Potassium: 3.9 mEq/L (ref 3.5–5.1)

## 2022-11-17 MED ORDER — CYANOCOBALAMIN 1000 MCG/ML IJ SOLN
1000.0000 ug | Freq: Once | INTRAMUSCULAR | Status: AC
  Administered 2022-11-17: 1000 ug via INTRAMUSCULAR

## 2022-11-17 NOTE — Progress Notes (Signed)
Pt presented for their vitamin B12 injection. Pt was identified through two identifiers. Pt tolerated shot well in their right deltoid.  

## 2022-11-18 ENCOUNTER — Telehealth: Payer: Self-pay | Admitting: Pain Medicine

## 2022-11-18 NOTE — Telephone Encounter (Signed)
PT stated that on her last visit doctor told her when she got the pain kit in that she could come by the office and a nurse would show her how to use it. Please stated that she will come tomorrow at 53

## 2022-11-19 NOTE — Telephone Encounter (Signed)
CJ from [facility] contacted this patient.

## 2022-11-24 DIAGNOSIS — E7800 Pure hypercholesterolemia, unspecified: Secondary | ICD-10-CM | POA: Diagnosis not present

## 2022-11-24 DIAGNOSIS — M7989 Other specified soft tissue disorders: Secondary | ICD-10-CM | POA: Diagnosis not present

## 2022-11-24 DIAGNOSIS — G894 Chronic pain syndrome: Secondary | ICD-10-CM | POA: Diagnosis not present

## 2022-11-24 DIAGNOSIS — I7090 Unspecified atherosclerosis: Secondary | ICD-10-CM | POA: Diagnosis not present

## 2022-11-24 DIAGNOSIS — M25472 Effusion, left ankle: Secondary | ICD-10-CM | POA: Diagnosis not present

## 2022-11-24 DIAGNOSIS — M25572 Pain in left ankle and joints of left foot: Secondary | ICD-10-CM | POA: Diagnosis not present

## 2022-11-24 DIAGNOSIS — Z79899 Other long term (current) drug therapy: Secondary | ICD-10-CM | POA: Diagnosis not present

## 2022-11-24 DIAGNOSIS — I10 Essential (primary) hypertension: Secondary | ICD-10-CM | POA: Diagnosis not present

## 2022-11-24 DIAGNOSIS — S99912A Unspecified injury of left ankle, initial encounter: Secondary | ICD-10-CM | POA: Diagnosis not present

## 2022-12-01 ENCOUNTER — Other Ambulatory Visit: Payer: Self-pay | Admitting: Internal Medicine

## 2022-12-06 ENCOUNTER — Other Ambulatory Visit: Payer: Self-pay | Admitting: Internal Medicine

## 2022-12-09 DIAGNOSIS — M25561 Pain in right knee: Secondary | ICD-10-CM | POA: Diagnosis not present

## 2022-12-09 DIAGNOSIS — G894 Chronic pain syndrome: Secondary | ICD-10-CM | POA: Diagnosis not present

## 2022-12-18 ENCOUNTER — Other Ambulatory Visit: Payer: Self-pay | Admitting: Internal Medicine

## 2022-12-18 ENCOUNTER — Ambulatory Visit (INDEPENDENT_AMBULATORY_CARE_PROVIDER_SITE_OTHER): Payer: Medicare HMO

## 2022-12-18 DIAGNOSIS — E538 Deficiency of other specified B group vitamins: Secondary | ICD-10-CM

## 2022-12-18 DIAGNOSIS — Z1231 Encounter for screening mammogram for malignant neoplasm of breast: Secondary | ICD-10-CM

## 2022-12-18 MED ORDER — CYANOCOBALAMIN 1000 MCG/ML IJ SOLN
1000.0000 ug | Freq: Once | INTRAMUSCULAR | Status: AC
  Administered 2022-12-18: 1000 ug via INTRAMUSCULAR

## 2022-12-18 NOTE — Progress Notes (Signed)
Patient presented for B 12 injection to left deltoid, patient voiced no concerns nor showed any signs of distress during injection. 

## 2022-12-24 ENCOUNTER — Other Ambulatory Visit: Payer: Self-pay | Admitting: Internal Medicine

## 2023-01-06 ENCOUNTER — Ambulatory Visit
Admission: RE | Admit: 2023-01-06 | Discharge: 2023-01-06 | Disposition: A | Discharge status: Home / Self Care | Payer: Medicare HMO | Source: Ambulatory Visit | Attending: Internal Medicine | Admitting: Internal Medicine

## 2023-01-06 DIAGNOSIS — Z1231 Encounter for screening mammogram for malignant neoplasm of breast: Secondary | ICD-10-CM | POA: Diagnosis not present

## 2023-01-08 ENCOUNTER — Ambulatory Visit (INDEPENDENT_AMBULATORY_CARE_PROVIDER_SITE_OTHER): Payer: Medicare HMO | Admitting: Internal Medicine

## 2023-01-08 ENCOUNTER — Encounter: Payer: Self-pay | Admitting: Internal Medicine

## 2023-01-08 VITALS — BP 130/70 | HR 84 | Temp 98.2°F | Resp 16 | Ht 66.0 in | Wt 145.0 lb

## 2023-01-08 DIAGNOSIS — E538 Deficiency of other specified B group vitamins: Secondary | ICD-10-CM | POA: Diagnosis not present

## 2023-01-08 DIAGNOSIS — M25561 Pain in right knee: Secondary | ICD-10-CM | POA: Diagnosis not present

## 2023-01-08 DIAGNOSIS — G8929 Other chronic pain: Secondary | ICD-10-CM

## 2023-01-08 DIAGNOSIS — Z0000 Encounter for general adult medical examination without abnormal findings: Secondary | ICD-10-CM | POA: Diagnosis not present

## 2023-01-08 DIAGNOSIS — K5010 Crohn's disease of large intestine without complications: Secondary | ICD-10-CM

## 2023-01-08 DIAGNOSIS — E7800 Pure hypercholesterolemia, unspecified: Secondary | ICD-10-CM

## 2023-01-08 DIAGNOSIS — D649 Anemia, unspecified: Secondary | ICD-10-CM

## 2023-01-08 DIAGNOSIS — I10 Essential (primary) hypertension: Secondary | ICD-10-CM | POA: Diagnosis not present

## 2023-01-08 LAB — IBC + FERRITIN
Ferritin: 63.3 ng/mL (ref 10.0–291.0)
Iron: 77 ug/dL (ref 42–145)
Saturation Ratios: 27.2 % (ref 20.0–50.0)
TIBC: 282.8 ug/dL (ref 250.0–450.0)
Transferrin: 202 mg/dL — ABNORMAL LOW (ref 212.0–360.0)

## 2023-01-08 LAB — CBC WITH DIFFERENTIAL/PLATELET
Basophils Absolute: 0 10*3/uL (ref 0.0–0.1)
Basophils Relative: 0.5 % (ref 0.0–3.0)
Eosinophils Absolute: 0.1 10*3/uL (ref 0.0–0.7)
Eosinophils Relative: 0.7 % (ref 0.0–5.0)
HCT: 35.7 % — ABNORMAL LOW (ref 36.0–46.0)
Hemoglobin: 11.7 g/dL — ABNORMAL LOW (ref 12.0–15.0)
Lymphocytes Relative: 22.2 % (ref 12.0–46.0)
Lymphs Abs: 1.8 10*3/uL (ref 0.7–4.0)
MCHC: 32.7 g/dL (ref 30.0–36.0)
MCV: 97.5 fl (ref 78.0–100.0)
Monocytes Absolute: 0.7 10*3/uL (ref 0.1–1.0)
Monocytes Relative: 8.9 % (ref 3.0–12.0)
Neutro Abs: 5.4 10*3/uL (ref 1.4–7.7)
Neutrophils Relative %: 67.7 % (ref 43.0–77.0)
Platelets: 187 10*3/uL (ref 150.0–400.0)
RBC: 3.66 Mil/uL — ABNORMAL LOW (ref 3.87–5.11)
RDW: 13.5 % (ref 11.5–15.5)
WBC: 8 10*3/uL (ref 4.0–10.5)

## 2023-01-08 LAB — HEPATIC FUNCTION PANEL
ALT: 13 U/L (ref 0–35)
AST: 26 U/L (ref 0–37)
Albumin: 4.3 g/dL (ref 3.5–5.2)
Alkaline Phosphatase: 64 U/L (ref 39–117)
Bilirubin, Direct: 0.1 mg/dL (ref 0.0–0.3)
Total Bilirubin: 0.3 mg/dL (ref 0.2–1.2)
Total Protein: 7.4 g/dL (ref 6.0–8.3)

## 2023-01-08 LAB — BASIC METABOLIC PANEL
BUN: 17 mg/dL (ref 6–23)
CO2: 31 mEq/L (ref 19–32)
Calcium: 9.6 mg/dL (ref 8.4–10.5)
Chloride: 103 mEq/L (ref 96–112)
Creatinine, Ser: 0.86 mg/dL (ref 0.40–1.20)
GFR: 65.29 mL/min (ref >60.00)
Glucose, Bld: 84 mg/dL (ref 70–99)
Potassium: 3.2 mEq/L — ABNORMAL LOW (ref 3.5–5.1)
Sodium: 142 mEq/L (ref 135–145)

## 2023-01-08 LAB — LIPID PANEL
Cholesterol: 158 mg/dL (ref 0–200)
HDL: 67 mg/dL (ref >39.00)
LDL Cholesterol: 75 mg/dL (ref 0–99)
NonHDL: 90.99
Total CHOL/HDL Ratio: 2
Triglycerides: 78 mg/dL (ref 0.0–149.0)
VLDL: 15.6 mg/dL (ref 0.0–40.0)

## 2023-01-08 LAB — VITAMIN B12: Vitamin B-12: 479 pg/mL (ref 211–911)

## 2023-01-08 NOTE — Assessment & Plan Note (Addendum)
Physical today 01/08/23.  Colonoscopy 04/2019 - tubular adenoma.  Follow up colonoscopy in 3 years.  Overdue.  Refer back to GI. Mammogram 01/06/23 - Birads I.

## 2023-01-08 NOTE — Progress Notes (Signed)
Subjective:    Patient ID: Ellen Drake, female    DOB: 1945/08/31, 77 y.o.   MRN: [id number]  Patient here for  Chief Complaint  Patient presents with   Annual Exam    HPI Here for physical exam.  Recently evaluated ER - left ankle pain and swelling.  Xray - no fracture.  Prescribed naproxen.  Better. Continues on lisinopril, amlodipine, atenolol and hctz for blood pressure.  Cronh's has been stable on sulfasalazine. She is due f/u with GI.  Tries to stay active.  Persistent knee pain.  Seeing pain clinic.  No chest pain or sob reported.  No abdominal pain or bowel change reported.     Past Medical History:  Diagnosis Date   Anemia    reslved   Colon polyp 2009   Crohn's disease (HCC)    Diverticulosis    Fibrocystic breast disease    Herniated nucleus pulposus of lumbosacral region    History of chicken pox    Hypercholesterolemia    Hyperlipidemia    Hypertension    Osteoarthritis    Pulmonary nodule seen on imaging study 2004   resolved 2007   Past Surgical History:  Procedure Laterality Date   anterior cervical discectomy and fusion  1998   Dr Alvy Beal SURGERY  1992   ruptured disc (Dr Gerrit Heck)   BREAST BIOPSY Left    neg-no scar seen   CATARACT EXTRACTION W/PHACO Right 02/09/2019   Procedure: CATARACT EXTRACTION PHACO AND INTRAOCULAR LENS PLACEMENT (IOC) KAHOOK DUAL BLADE GONIOTOMY  RIGHT HEALON 5, VISION BLUE;  Surgeon: Lockie Mola, MD;  Location: [facility];  Service: Ophthalmology;  Laterality: Right;   COLECTOMY  2003   COLON SURGERY  1998   COLONOSCOPY  2009   COLONOSCOPY W/ BIOPSIES  11/30/2012   Procedure: COLONOSCOPY W/BIOPSY; Surgeon: Benjie Karvonen, MD; Location: [facility]; Service: Gastroenterology;;   COLONOSCOPY W/ BIOPSIES  04/15/2016   Procedure: Colonoscopy ; Surgeon: Benjie Karvonen, MD; Location: [facility]; Service: Gastroenterology; Laterality: N/A;   KNEE ARTHROSCOPY W/ SYNOVECTOMY  Right 03/10/2014   limited   KNEE SURGERY Right 1/16   [facility] (Dr. Ophelia Shoulder)   POSTERIOR LAMINECTOMY / DECOMPRESSION LUMBAR SPINE  1999   REPLACEMENT TOTAL KNEE     ROTATOR CUFF REPAIR Left 2007   acute open; accident   Family History  Problem Relation Age of Onset   CVA Mother    Hypertension Mother    Hypertension Sister        brain tumor   Breast cancer Neg Hx    Colon cancer Neg Hx    Social History   Socioeconomic History   Marital status: Married    Spouse name: Not on file   Number of children: 0   Years of education: 12   Highest education level: Not on file  Occupational History   Not on file  Tobacco Use   Smoking status: Former    Types: Cigarettes    Quit date: 09/10/2009    Years since quitting: 13.3   Smokeless tobacco: Never  Vaping Use   Vaping Use: Never used  Substance and Sexual Activity   Alcohol use: Yes    Alcohol/week: 1.0 standard drink of alcohol    Types: 1 Glasses of wine per week    Comment: wine occasional; socially   Drug use: No   Sexual activity: Yes  Other Topics Concern   Not on file  Social  History Narrative   Not on file   Social Determinants of Health   Financial Resource Strain: Low Risk  (07/18/2022)   Overall Financial Resource Strain (CARDIA)    Difficulty of Paying Living Expenses: Not hard at all  Food Insecurity: No Food Insecurity (07/18/2022)   Hunger Vital Sign    Worried About Running Out of Food in the Last Year: Never true    Ran Out of Food in the Last Year: Never true  Transportation Needs: No Transportation Needs (07/18/2022)   PRAPARE - Administrator, Civil Service (Medical): No    Lack of Transportation (Non-Medical): No  Physical Activity: Unknown (07/17/2021)   Exercise Vital Sign    Days of Exercise per Week: 0 days    Minutes of Exercise per Session: Not on file  Stress: No Stress Concern Present (07/18/2022)   [employer] of Occupational Health - Occupational Stress  Questionnaire    Feeling of Stress : Not at all  Social Connections: Unknown (07/18/2022)   Social Connection and Isolation Panel [NHANES]    Frequency of Communication with Friends and Family: More than three times a week    Frequency of Social Gatherings with Friends and Family: More than three times a week    Attends Religious Services: Not on Marketing executive or Organizations: Yes    Attends Banker Meetings: More than 4 times per year    Marital Status: Married     Review of Systems  Constitutional:  Negative for appetite change and unexpected weight change.  HENT:  Negative for congestion, sinus pressure and sore throat.   Eyes:  Negative for pain and visual disturbance.  Respiratory:  Negative for cough, chest tightness and shortness of breath.   Cardiovascular:  Negative for chest pain and palpitations.  Gastrointestinal:  Negative for abdominal pain, diarrhea, nausea and vomiting.  Genitourinary:  Negative for difficulty urinating and dysuria.  Musculoskeletal:  Negative for myalgias.       Persistent knee pain as outlined.   Skin:  Negative for color change and rash.  Neurological:  Negative for dizziness and headaches.  Hematological:  Negative for adenopathy. Does not bruise/bleed easily.  Psychiatric/Behavioral:  Negative for agitation and dysphoric mood.        Objective:     BP 130/70   Pulse 84   Temp 98.2 F (36.8 C)   Resp 16   Ht 5\' 6"  (1.676 m)   Wt 145 lb (65.8 kg)   LMP 08/13/1995   SpO2 99%   BMI 23.40 kg/m  Wt Readings from Last 3 Encounters:  01/08/23 145 lb (65.8 kg)  11/03/22 142 lb (64.4 kg)  10/15/22 145 lb (65.8 kg)    Physical Exam Vitals reviewed.  Constitutional:      General: She is not in acute distress.    Appearance: Normal appearance. She is well-developed.  HENT:     Head: Normocephalic and atraumatic.     Right Ear: External ear normal.     Left Ear: External ear normal.  Eyes:     General:  No scleral icterus.       Right eye: No discharge.        Left eye: No discharge.     Conjunctiva/sclera: Conjunctivae normal.  Neck:     Thyroid: No thyromegaly.  Cardiovascular:     Rate and Rhythm: Normal rate and regular rhythm.  Pulmonary:     Effort: No tachypnea, accessory  muscle usage or respiratory distress.     Breath sounds: Normal breath sounds. No decreased breath sounds or wheezing.  Chest:  Breasts:    Right: No inverted nipple, mass, nipple discharge or tenderness (no axillary adenopathy).     Left: No inverted nipple, mass, nipple discharge or tenderness (no axilarry adenopathy).  Abdominal:     General: Bowel sounds are normal.     Palpations: Abdomen is soft.     Tenderness: There is no abdominal tenderness.  Musculoskeletal:        General: No swelling or tenderness.     Cervical back: Neck supple.  Lymphadenopathy:     Cervical: No cervical adenopathy.  Skin:    Findings: No erythema or rash.  Neurological:     Mental Status: She is alert and oriented to person, place, and time.  Psychiatric:        Mood and Affect: Mood normal.        Behavior: Behavior normal.      Outpatient Encounter Medications as of 01/08/2023  Medication Sig   amLODipine (NORVASC) 10 MG tablet TAKE 1 TABLET EVERY DAY   ASPIRIN 81 PO Take by mouth daily.   atenolol (TENORMIN) 50 MG tablet TAKE 1 TABLET EVERY DAY   brimonidine (ALPHAGAN) 0.2 % ophthalmic solution 1 drop 2 (two) times daily.    clotrimazole-betamethasone (LOTRISONE) cream APPLY TO AFFECTED AREA TWICE A DAY   gabapentin (NEURONTIN) 100 MG capsule Take one capsule q am and one capsule q hs   hydrochlorothiazide (MICROZIDE) 12.5 MG capsule TAKE 1 CAPSULE BY MOUTH EVERY DAY   latanoprost (XALATAN) 0.005 % ophthalmic solution Place 1 drop into both eyes at bedtime.    lisinopril (ZESTRIL) 40 MG tablet TAKE 1 TABLET EVERY DAY   magnesium oxide (MAG-OX) 400 (240 Mg) MG tablet TAKE 1 TABLET BY MOUTH EVERY DAY   nystatin  cream (MYCOSTATIN) Apply 1 application topically 2 (two) times daily.   rosuvastatin (CRESTOR) 10 MG tablet TAKE 1 TABLET EVERY DAY   sulfaSALAzine (AZULFIDINE) 500 MG tablet Take 1,500 mg by mouth 2 (two) times daily. 3 tabs   timolol (TIMOPTIC) 0.5 % ophthalmic solution Place 1 drop into both eyes 2 (two) times daily.   traMADol (ULTRAM) 50 MG tablet Take 1 tablet (50 mg total) by mouth 2 (two) times daily. Each refill must last 30 days.   [DISCONTINUED] KLOR-CON M10 10 MEQ tablet TAKE 1 TABLET BY MOUTH EVERY DAY   [DISCONTINUED] naloxone (NARCAN) nasal spray 4 mg/0.1 mL Place 1 spray into the nose as needed for up to 365 doses (for opioid-induced respiratory depresssion). In case of emergency (overdose), spray once into each nostril. If no response within 3 minutes, repeat application and call 911.   No facility-administered encounter medications on file as of 01/08/2023.     Lab Results  Component Value Date   WBC 8.0 01/08/2023   HGB 11.7 (L) 01/08/2023   HCT 35.7 (L) 01/08/2023   PLT 187.0 01/08/2023   GLUCOSE 84 01/08/2023   CHOL 158 01/08/2023   TRIG 78.0 01/08/2023   HDL 67.00 01/08/2023   LDLDIRECT 132.5 05/06/2013   LDLCALC 75 01/08/2023   ALT 13 01/08/2023   AST 26 01/08/2023   NA 142 01/08/2023   K 3.2 (L) 01/08/2023   CL 103 01/08/2023   CREATININE 0.86 01/08/2023   BUN 17 01/08/2023   CO2 31 01/08/2023   TSH 2.58 08/26/2022   HGBA1C 5.7 10/19/2012    MM 3D SCREENING MAMMOGRAM  BILATERAL BREAST  Result Date: 01/07/2023 CLINICAL DATA:  Screening. EXAM: DIGITAL SCREENING BILATERAL MAMMOGRAM WITH TOMOSYNTHESIS AND CAD TECHNIQUE: Bilateral screening digital craniocaudal and mediolateral oblique mammograms were obtained. Bilateral screening digital breast tomosynthesis was performed. The images were evaluated with computer-aided detection. COMPARISON:  Previous exam(s). ACR Breast Density Category b: There are scattered areas of fibroglandular density. FINDINGS: There are  no findings suspicious for malignancy. IMPRESSION: No mammographic evidence of malignancy. A result letter of this screening mammogram will be mailed directly to the patient. RECOMMENDATION: Screening mammogram in one year. (Code:SM-B-01Y) BI-RADS CATEGORY  1: Negative. Electronically Signed   By: Hulan Saas M.D.   On: 01/07/2023 15:41       Assessment & Plan:  Primary hypertension Assessment & Plan: Continue lisinopril, amlodipine and atenolol.  Recently added hctz 12.5mg  q day.   Pressure is better.  Continue current medication regimen.  Follow pressures.  Follow metabolic panel.   Orders: -     Basic metabolic panel  Hypercholesterolemia Assessment & Plan: Continue crestor.  Follow lipid panel and liver function tests.   Orders: -     Hepatic function panel -     Lipid panel  Health care maintenance Assessment & Plan: Physical today 01/08/23.  Colonoscopy 04/2019 - tubular adenoma.  Follow up colonoscopy in 3 years.  Overdue.  Refer back to GI. Mammogram 01/06/23 - Birads I.     Anemia, unspecified type Assessment & Plan: Check cbc.    Orders: -     CBC with Differential/Platelet -     IBC + Ferritin -     Vitamin B12  Chronic knee pain (1ry area of Pain) (Right) Assessment & Plan: Persistent increased pain.  Has seen multiple specialist and had several procedures. No relief.  Chronic pain and swelling.  Not a surgical candidate now.  Being followed by pain clinic.    Crohn's disease of large intestine without complication (HCC) Assessment & Plan: Stable.  Continue sulfasalazine.  Has been followed by GI ([facility]). Overdue f/u.    Orders: -     Ambulatory referral to Gastroenterology  Vitamin B12 deficiency Assessment & Plan: Continue b12 injections.       Dale , MD

## 2023-01-09 ENCOUNTER — Other Ambulatory Visit: Payer: Self-pay

## 2023-01-09 ENCOUNTER — Telehealth: Payer: Self-pay

## 2023-01-09 DIAGNOSIS — E876 Hypokalemia: Secondary | ICD-10-CM

## 2023-01-09 MED ORDER — POTASSIUM CHLORIDE CRYS ER 10 MEQ PO TBCR: 10.0000 meq | EXTENDED_RELEASE_TABLET | Freq: Every day | ORAL | 1 refills | Status: DC

## 2023-01-09 NOTE — Telephone Encounter (Signed)
Patient called note was read. Patient understands she will have labs on 01/22/2023.   Patient stated her pharmacy did not have a prescription for her  potassium . CMA was called and prescription was to be sent.

## 2023-01-09 NOTE — Telephone Encounter (Signed)
-----   Message from Dale Oxford, MD sent at 01/09/2023  4:49 AM EDT ----- Notify - potassium is low.  During her visit, she reported she had been off her potassium.  Needs to restart daily potassium.  Would like to recheck potassium - can do when comes in for B12 01/22/23. Please schedule lab appt.  Hgb stable.  Slightly decreased but stable.  Kidney function improved.  B12 level wnl.  Continue b12 injections. Cholesterol levels are ok.  Liver panel wnl.

## 2023-01-09 NOTE — Telephone Encounter (Signed)
Prescription resent

## 2023-01-12 ENCOUNTER — Encounter: Payer: Self-pay | Admitting: Internal Medicine

## 2023-01-12 NOTE — Assessment & Plan Note (Signed)
Continue b12 injections.  

## 2023-01-12 NOTE — Assessment & Plan Note (Signed)
Persistent increased pain.  Has seen multiple specialist and had several procedures. No relief.  Chronic pain and swelling.  Not a surgical candidate now.  Being followed by pain clinic.  

## 2023-01-12 NOTE — Assessment & Plan Note (Signed)
Stable.  Continue sulfasalazine.  Has been followed by GI ([facility]). Overdue f/u.

## 2023-01-12 NOTE — Assessment & Plan Note (Signed)
Continue crestor. Follow lipid panel and liver function tests.   

## 2023-01-12 NOTE — Assessment & Plan Note (Signed)
Continue lisinopril, amlodipine and atenolol.  Recently added hctz 12.5mg  q day.   Pressure is better.  Continue current medication regimen.  Follow pressures.  Follow metabolic panel.

## 2023-01-12 NOTE — Assessment & Plan Note (Signed)
Check cbc 

## 2023-01-19 ENCOUNTER — Ambulatory Visit: Payer: Medicare HMO

## 2023-01-19 DIAGNOSIS — M7542 Impingement syndrome of left shoulder: Secondary | ICD-10-CM | POA: Diagnosis not present

## 2023-01-22 ENCOUNTER — Ambulatory Visit (INDEPENDENT_AMBULATORY_CARE_PROVIDER_SITE_OTHER): Payer: Medicare HMO

## 2023-01-22 DIAGNOSIS — E876 Hypokalemia: Secondary | ICD-10-CM | POA: Diagnosis not present

## 2023-01-22 DIAGNOSIS — E538 Deficiency of other specified B group vitamins: Secondary | ICD-10-CM | POA: Diagnosis not present

## 2023-01-22 LAB — POTASSIUM: Potassium: 3.5 mEq/L (ref 3.5–5.1)

## 2023-01-22 MED ORDER — CYANOCOBALAMIN 1000 MCG/ML IJ SOLN
1000.0000 ug | Freq: Once | INTRAMUSCULAR | Status: AC
Start: 2023-01-22 — End: 2023-01-22
  Administered 2023-01-22: 1000 ug via INTRAMUSCULAR

## 2023-01-22 NOTE — Progress Notes (Signed)
Patient arrived for a B12 injection and it was administered into her right deltoid. Patient tolerated the injection well and did not show any signs of distress or voiced any concerns.

## 2023-02-04 ENCOUNTER — Telehealth: Payer: Self-pay

## 2023-02-04 NOTE — Telephone Encounter (Signed)
Patient states she would like for Rita Ohara, CMA, to call her back.   Patient states her get up and go, got up and went.  Patient states this medication is not working for her.

## 2023-02-04 NOTE — Telephone Encounter (Signed)
Patient stated that when she takes her magnesium she does not feel good. No energy. She cut back to every other day but symptom did not resolve. She is going to try stopping the magnesium and see if this makes a difference and then call back with update. Confirmed with patient no other acute issues.

## 2023-02-23 ENCOUNTER — Ambulatory Visit: Payer: Medicare HMO | Admitting: *Deleted

## 2023-02-23 DIAGNOSIS — E876 Hypokalemia: Secondary | ICD-10-CM

## 2023-02-23 DIAGNOSIS — E538 Deficiency of other specified B group vitamins: Secondary | ICD-10-CM | POA: Diagnosis not present

## 2023-02-23 LAB — POTASSIUM: Potassium: 3.4 mEq/L — ABNORMAL LOW (ref 3.5–5.1)

## 2023-02-23 MED ORDER — CYANOCOBALAMIN 1000 MCG/ML IJ SOLN
1000.0000 ug | Freq: Once | INTRAMUSCULAR | Status: AC
Start: 2023-02-23 — End: 2023-02-23
  Administered 2023-02-23: 1000 ug via INTRAMUSCULAR

## 2023-02-23 NOTE — Progress Notes (Signed)
Patient arrived for a B12 injection and it was administered into her left deltoid. Patient tolerated the injection well and did not show any signs of distress or voice any concerns. 

## 2023-02-24 ENCOUNTER — Other Ambulatory Visit: Payer: Self-pay

## 2023-02-24 DIAGNOSIS — E876 Hypokalemia: Secondary | ICD-10-CM

## 2023-03-04 DIAGNOSIS — Z96651 Presence of right artificial knee joint: Secondary | ICD-10-CM | POA: Diagnosis not present

## 2023-03-04 DIAGNOSIS — M1711 Unilateral primary osteoarthritis, right knee: Secondary | ICD-10-CM | POA: Diagnosis not present

## 2023-03-06 ENCOUNTER — Other Ambulatory Visit: Payer: Self-pay | Admitting: Student

## 2023-03-06 DIAGNOSIS — Z96651 Presence of right artificial knee joint: Secondary | ICD-10-CM

## 2023-03-09 ENCOUNTER — Telehealth: Payer: Self-pay | Admitting: *Deleted

## 2023-03-09 ENCOUNTER — Other Ambulatory Visit (INDEPENDENT_AMBULATORY_CARE_PROVIDER_SITE_OTHER): Payer: Medicare HMO

## 2023-03-09 DIAGNOSIS — E876 Hypokalemia: Secondary | ICD-10-CM | POA: Diagnosis not present

## 2023-03-09 DIAGNOSIS — Z96651 Presence of right artificial knee joint: Secondary | ICD-10-CM | POA: Diagnosis not present

## 2023-03-09 LAB — POTASSIUM: Potassium: 4.1 mEq/L (ref 3.5–5.1)

## 2023-03-09 LAB — SEDIMENTATION RATE: Sed Rate: 32 mm/hr — ABNORMAL HIGH (ref 0–30)

## 2023-03-09 LAB — C-REACTIVE PROTEIN: CRP: 4.9 mg/dL (ref 0.5–20.0)

## 2023-03-09 NOTE — Telephone Encounter (Signed)
Pt brought in orders from [facility] that were needed prior to her surgery.  I have placed orders for CRP & Sed Rate & CC'd ordering provider: Attn: Rosalene Billings, PA  Pt aware that we could not use their form unless we ordered them under Dr. Lorin Picket & send to requesting provider.

## 2023-03-10 NOTE — Telephone Encounter (Signed)
Error

## 2023-03-12 ENCOUNTER — Other Ambulatory Visit: Payer: Medicare HMO

## 2023-03-12 ENCOUNTER — Encounter
Admission: RE | Admit: 2023-03-12 | Discharge: 2023-03-12 | Disposition: A | Discharge status: Home / Self Care | Payer: Medicare HMO | Source: Ambulatory Visit | Attending: Student | Admitting: Student

## 2023-03-12 DIAGNOSIS — Z96651 Presence of right artificial knee joint: Secondary | ICD-10-CM | POA: Insufficient documentation

## 2023-03-12 MED ORDER — TECHNETIUM TC 99M MEDRONATE IV KIT
20.0000 | PACK | Freq: Once | INTRAVENOUS | Status: AC | PRN
  Administered 2023-03-12: 21.22 via INTRAVENOUS

## 2023-03-16 ENCOUNTER — Encounter: Payer: Self-pay | Admitting: Internal Medicine

## 2023-03-16 ENCOUNTER — Ambulatory Visit (INDEPENDENT_AMBULATORY_CARE_PROVIDER_SITE_OTHER): Payer: Medicare HMO | Admitting: Internal Medicine

## 2023-03-16 ENCOUNTER — Emergency Department (HOSPITAL_COMMUNITY): Payer: Medicare HMO

## 2023-03-16 ENCOUNTER — Other Ambulatory Visit: Payer: Self-pay

## 2023-03-16 ENCOUNTER — Telehealth: Payer: Self-pay | Admitting: *Deleted

## 2023-03-16 ENCOUNTER — Encounter (HOSPITAL_COMMUNITY): Payer: Self-pay

## 2023-03-16 ENCOUNTER — Inpatient Hospital Stay (HOSPITAL_COMMUNITY)
Admission: EM | Admit: 2023-03-16 | Discharge: 2023-03-20 | DRG: 243 | Disposition: A | Discharge status: Home / Self Care | Payer: Medicare HMO | Attending: Cardiology | Admitting: Cardiology

## 2023-03-16 VITALS — BP 130/68 | HR 28 | Temp 97.5°F | Ht 66.0 in | Wt 145.2 lb

## 2023-03-16 DIAGNOSIS — R001 Bradycardia, unspecified: Secondary | ICD-10-CM | POA: Diagnosis present

## 2023-03-16 DIAGNOSIS — Z981 Arthrodesis status: Secondary | ICD-10-CM | POA: Diagnosis not present

## 2023-03-16 DIAGNOSIS — Z87891 Personal history of nicotine dependence: Secondary | ICD-10-CM | POA: Diagnosis not present

## 2023-03-16 DIAGNOSIS — K5090 Crohn's disease, unspecified, without complications: Secondary | ICD-10-CM | POA: Diagnosis present

## 2023-03-16 DIAGNOSIS — I700 Atherosclerosis of aorta: Secondary | ICD-10-CM | POA: Diagnosis not present

## 2023-03-16 DIAGNOSIS — N179 Acute kidney failure, unspecified: Secondary | ICD-10-CM | POA: Diagnosis not present

## 2023-03-16 DIAGNOSIS — I10 Essential (primary) hypertension: Secondary | ICD-10-CM | POA: Diagnosis present

## 2023-03-16 DIAGNOSIS — N6019 Diffuse cystic mastopathy of unspecified breast: Secondary | ICD-10-CM | POA: Diagnosis present

## 2023-03-16 DIAGNOSIS — Z885 Allergy status to narcotic agent status: Secondary | ICD-10-CM | POA: Diagnosis not present

## 2023-03-16 DIAGNOSIS — Z9049 Acquired absence of other specified parts of digestive tract: Secondary | ICD-10-CM | POA: Diagnosis not present

## 2023-03-16 DIAGNOSIS — R5383 Other fatigue: Secondary | ICD-10-CM | POA: Diagnosis not present

## 2023-03-16 DIAGNOSIS — Z8249 Family history of ischemic heart disease and other diseases of the circulatory system: Secondary | ICD-10-CM

## 2023-03-16 DIAGNOSIS — Z888 Allergy status to other drugs, medicaments and biological substances status: Secondary | ICD-10-CM

## 2023-03-16 DIAGNOSIS — K5010 Crohn's disease of large intestine without complications: Secondary | ICD-10-CM | POA: Diagnosis not present

## 2023-03-16 DIAGNOSIS — Z86010 Personal history of colonic polyps: Secondary | ICD-10-CM

## 2023-03-16 DIAGNOSIS — Z96659 Presence of unspecified artificial knee joint: Secondary | ICD-10-CM | POA: Diagnosis present

## 2023-03-16 DIAGNOSIS — Z823 Family history of stroke: Secondary | ICD-10-CM | POA: Diagnosis not present

## 2023-03-16 DIAGNOSIS — Z7982 Long term (current) use of aspirin: Secondary | ICD-10-CM | POA: Diagnosis not present

## 2023-03-16 DIAGNOSIS — Z79899 Other long term (current) drug therapy: Secondary | ICD-10-CM

## 2023-03-16 DIAGNOSIS — Z961 Presence of intraocular lens: Secondary | ICD-10-CM | POA: Diagnosis present

## 2023-03-16 DIAGNOSIS — I080 Rheumatic disorders of both mitral and aortic valves: Secondary | ICD-10-CM | POA: Diagnosis present

## 2023-03-16 DIAGNOSIS — E876 Hypokalemia: Secondary | ICD-10-CM | POA: Diagnosis not present

## 2023-03-16 DIAGNOSIS — I441 Atrioventricular block, second degree: Secondary | ICD-10-CM | POA: Diagnosis present

## 2023-03-16 DIAGNOSIS — R6889 Other general symptoms and signs: Secondary | ICD-10-CM

## 2023-03-16 DIAGNOSIS — Z9841 Cataract extraction status, right eye: Secondary | ICD-10-CM

## 2023-03-16 DIAGNOSIS — E7800 Pure hypercholesterolemia, unspecified: Secondary | ICD-10-CM | POA: Diagnosis present

## 2023-03-16 DIAGNOSIS — R079 Chest pain, unspecified: Secondary | ICD-10-CM | POA: Diagnosis not present

## 2023-03-16 LAB — BASIC METABOLIC PANEL
Anion gap: 10 (ref 5–15)
BUN: 19 mg/dL (ref 8–23)
CO2: 24 mmol/L (ref 22–32)
Calcium: 9.3 mg/dL (ref 8.9–10.3)
Chloride: 107 mmol/L (ref 98–111)
Creatinine, Ser: 1.02 mg/dL — ABNORMAL HIGH (ref 0.44–1.00)
GFR, Estimated: 57 mL/min — ABNORMAL LOW (ref >60)
Glucose, Bld: 82 mg/dL (ref 70–99)
Potassium: 3.3 mmol/L — ABNORMAL LOW (ref 3.5–5.1)
Sodium: 141 mmol/L (ref 135–145)

## 2023-03-16 LAB — CBC
HCT: 33.3 % — ABNORMAL LOW (ref 36.0–46.0)
Hemoglobin: 10.8 g/dL — ABNORMAL LOW (ref 12.0–15.0)
MCH: 31.8 pg (ref 26.0–34.0)
MCHC: 32.4 g/dL (ref 30.0–36.0)
MCV: 97.9 fL (ref 80.0–100.0)
Platelets: 169 10*3/uL (ref 150–400)
RBC: 3.4 MIL/uL — ABNORMAL LOW (ref 3.87–5.11)
RDW: 13.7 % (ref 11.5–15.5)
WBC: 7.2 10*3/uL (ref 4.0–10.5)
nRBC: 0 % (ref 0.0–0.2)

## 2023-03-16 LAB — TSH: TSH: 1.789 u[IU]/mL (ref 0.350–4.500)

## 2023-03-16 MED ORDER — BRIMONIDINE TARTRATE 0.2 % OP SOLN
1.0000 [drp] | Freq: Two times a day (BID) | OPHTHALMIC | Status: DC
  Administered 2023-03-16 – 2023-03-20 (×8): 1 [drp] via OPHTHALMIC
  Filled 2023-03-16: qty 5

## 2023-03-16 MED ORDER — GABAPENTIN 100 MG PO CAPS: 100.0000 mg | ORAL_CAPSULE | Freq: Two times a day (BID) | ORAL | Status: DC

## 2023-03-16 MED ORDER — HYDRALAZINE HCL 20 MG/ML IJ SOLN
5.0000 mg | INTRAMUSCULAR | Status: DC | PRN
  Administered 2023-03-16 – 2023-03-18 (×3): 5 mg via INTRAVENOUS
  Filled 2023-03-16 (×2): qty 1

## 2023-03-16 MED ORDER — HYDRALAZINE HCL 20 MG/ML IJ SOLN
2.0000 mg | INTRAMUSCULAR | Status: DC | PRN
  Filled 2023-03-16: qty 1

## 2023-03-16 MED ORDER — SULFASALAZINE 500 MG PO TABS
1500.0000 mg | ORAL_TABLET | Freq: Two times a day (BID) | ORAL | Status: DC
  Administered 2023-03-16 – 2023-03-20 (×8): 1500 mg via ORAL
  Filled 2023-03-16 (×8): qty 3

## 2023-03-16 MED ORDER — ROSUVASTATIN CALCIUM 5 MG PO TABS
10.0000 mg | ORAL_TABLET | Freq: Every day | ORAL | Status: DC
  Administered 2023-03-17 – 2023-03-20 (×4): 10 mg via ORAL
  Filled 2023-03-16 (×4): qty 2

## 2023-03-16 MED ORDER — TIMOLOL MALEATE 0.5 % OP SOLN
1.0000 [drp] | Freq: Two times a day (BID) | OPHTHALMIC | Status: DC
  Administered 2023-03-17 – 2023-03-20 (×6): 1 [drp] via OPHTHALMIC
  Filled 2023-03-16: qty 5

## 2023-03-16 MED ORDER — ONDANSETRON HCL 4 MG/2ML IJ SOLN
4.0000 mg | Freq: Four times a day (QID) | INTRAMUSCULAR | Status: DC | PRN
  Administered 2023-03-18 – 2023-03-19 (×2): 4 mg via INTRAVENOUS
  Filled 2023-03-16 (×2): qty 2

## 2023-03-16 MED ORDER — ACETAMINOPHEN 325 MG PO TABS
650.0000 mg | ORAL_TABLET | ORAL | Status: DC | PRN
  Administered 2023-03-17 – 2023-03-20 (×2): 650 mg via ORAL
  Filled 2023-03-16 (×3): qty 2

## 2023-03-16 MED ORDER — ENOXAPARIN SODIUM 40 MG/0.4ML IJ SOSY
40.0000 mg | PREFILLED_SYRINGE | INTRAMUSCULAR | Status: DC
  Administered 2023-03-16 – 2023-03-17 (×2): 40 mg via SUBCUTANEOUS
  Filled 2023-03-16 (×2): qty 0.4

## 2023-03-16 MED ORDER — LATANOPROST 0.005 % OP SOLN
1.0000 [drp] | Freq: Every day | OPHTHALMIC | Status: DC
  Administered 2023-03-16 – 2023-03-19 (×4): 1 [drp] via OPHTHALMIC
  Filled 2023-03-16: qty 2.5

## 2023-03-16 NOTE — Progress Notes (Signed)
Subjective:    Patient ID: Ellen Drake, female    DOB: [birth year], 77 y.o.   MRN: [id number]  Patient here for  Chief Complaint  Patient presents with   Fatigue   Bradycardia    HPI Here as a work in appt - to review medications. Worked in - due to noticing 2 weeks ago, decreased energy. She is currently on lisinopril, amlodipine and atenolol. Recently added hctz 12.5mg  q day. Blood pressure had been doing better.  Reports - fatigue and decreased energy.  No dizziness or light headedness.  No headache.  No syncope or near syncope.  No chest pain.  Some sob with exertion.  Reports she is unable to walk across the room without giving out and having to sit down. No nausea or vomiting.  No abdominal pain or diarrhea.    Past Medical History:  Diagnosis Date   Anemia    reslved   Colon polyp 2009   Crohn's disease (HCC)    Diverticulosis    Fibrocystic breast disease    Herniated nucleus pulposus of lumbosacral region    History of chicken pox    Hypercholesterolemia    Hyperlipidemia    Hypertension    Osteoarthritis    Pulmonary nodule seen on imaging study 2004   resolved 2007   Past Surgical History:  Procedure Laterality Date   anterior cervical discectomy and fusion  1998   Dr Alvy Beal SURGERY  1992   ruptured disc (Dr Gerrit Heck)   BREAST BIOPSY Left    neg-no scar seen   CATARACT EXTRACTION W/PHACO Right 02/09/2019   Procedure: CATARACT EXTRACTION PHACO AND INTRAOCULAR LENS PLACEMENT (IOC) KAHOOK DUAL BLADE GONIOTOMY  RIGHT HEALON 5, VISION BLUE;  Surgeon: Lockie Mola, MD;  Location: [facility];  Service: Ophthalmology;  Laterality: Right;   COLECTOMY  2003   COLON SURGERY  1998   COLONOSCOPY  2009   COLONOSCOPY W/ BIOPSIES  11/30/2012   Procedure: COLONOSCOPY W/BIOPSY; Surgeon: Benjie Karvonen, MD; Location: [facility]; Service: Gastroenterology;;   COLONOSCOPY W/ BIOPSIES  04/15/2016   Procedure: Colonoscopy ; Surgeon: Benjie Karvonen, MD; Location: [facility]; Service: Gastroenterology; Laterality: N/A;   KNEE ARTHROSCOPY W/ SYNOVECTOMY Right 03/10/2014   limited   KNEE SURGERY Right 1/16   [facility] (Dr. Ophelia Shoulder)   POSTERIOR LAMINECTOMY / DECOMPRESSION LUMBAR SPINE  1999   REPLACEMENT TOTAL KNEE     ROTATOR CUFF REPAIR Left 2007   acute open; accident   Family History  Problem Relation Age of Onset   CVA Mother    Hypertension Mother    Hypertension Sister        brain tumor   Breast cancer Neg Hx    Colon cancer Neg Hx    Social History   Socioeconomic History   Marital status: Married    Spouse name: Not on file   Number of children: 0   Years of education: 12   Highest education level: Not on file  Occupational History   Not on file  Tobacco Use   Smoking status: Former    Current packs/day: 0.00    Types: Cigarettes    Quit date: 09/10/2009    Years since quitting: 13.5   Smokeless tobacco: Never  Vaping Use   Vaping status: Never Used  Substance and Sexual Activity   Alcohol use: Yes    Alcohol/week: 1.0 standard drink of alcohol    Types: 1 Glasses  of wine per week    Comment: wine occasional; socially   Drug use: No   Sexual activity: Yes  Other Topics Concern   Not on file  Social History Narrative   Not on file   Social Determinants of Health   Financial Resource Strain: Low Risk  (07/18/2022)   Overall Financial Resource Strain (CARDIA)    Difficulty of Paying Living Expenses: Not hard at all  Food Insecurity: No Food Insecurity (07/18/2022)   Hunger Vital Sign    Worried About Running Out of Food in the Last Year: Never true    Ran Out of Food in the Last Year: Never true  Transportation Needs: No Transportation Needs (07/18/2022)   PRAPARE - Administrator, Civil Service (Medical): No    Lack of Transportation (Non-Medical): No  Physical Activity: Unknown (07/17/2021)   Exercise Vital Sign    Days of Exercise per Week: 0 days    Minutes  of Exercise per Session: Not on file  Stress: No Stress Concern Present (07/18/2022)   [employer] of Occupational Health - Occupational Stress Questionnaire    Feeling of Stress : Not at all  Social Connections: Unknown (07/18/2022)   Social Connection and Isolation Panel [NHANES]    Frequency of Communication with Friends and Family: More than three times a week    Frequency of Social Gatherings with Friends and Family: More than three times a week    Attends Religious Services: Not on Marketing executive or Organizations: Yes    Attends Banker Meetings: More than 4 times per year    Marital Status: Married     Review of Systems  Constitutional:  Positive for fatigue. Negative for appetite change and unexpected weight change.  HENT:  Negative for congestion and sinus pressure.   Respiratory:  Positive for shortness of breath. Negative for cough and chest tightness.   Cardiovascular:  Negative for chest pain, palpitations and leg swelling.  Gastrointestinal:  Negative for abdominal pain, diarrhea, nausea and vomiting.  Genitourinary:  Negative for difficulty urinating and dysuria.  Musculoskeletal:  Negative for joint swelling and myalgias.  Skin:  Negative for color change and rash.  Neurological:  Negative for dizziness and headaches.  Psychiatric/Behavioral:  Negative for agitation and dysphoric mood.        Objective:     BP 130/68   Pulse (!) 28   Temp (!) 97.5 F (36.4 C) (Oral)   Ht 5\' 6"  (1.676 m)   Wt 145 lb 3.2 oz (65.9 kg)   LMP 08/13/1995   SpO2 97%   BMI 23.44 kg/m  Wt Readings from Last 3 Encounters:  03/16/23 145 lb 4.5 oz (65.9 kg)  03/16/23 145 lb 3.2 oz (65.9 kg)  01/08/23 145 lb (65.8 kg)    Physical Exam Vitals reviewed.  Constitutional:      General: She is not in acute distress.    Appearance: Normal appearance.  HENT:     Head: Normocephalic and atraumatic.     Right Ear: External ear normal.     Left Ear:  External ear normal.  Eyes:     General: No scleral icterus.       Right eye: No discharge.        Left eye: No discharge.     Conjunctiva/sclera: Conjunctivae normal.  Neck:     Thyroid: No thyromegaly.  Cardiovascular:     Rate and Rhythm: Regular rhythm.  Comments: SB- ventricular rate approximately 30.  Pulmonary:     Effort: No respiratory distress.     Breath sounds: Normal breath sounds. No wheezing.  Abdominal:     General: Bowel sounds are normal.     Palpations: Abdomen is soft.     Tenderness: There is no abdominal tenderness.  Musculoskeletal:        General: No swelling or tenderness.     Cervical back: Neck supple. No tenderness.  Lymphadenopathy:     Cervical: No cervical adenopathy.  Skin:    Findings: No erythema or rash.  Neurological:     Mental Status: She is alert.  Psychiatric:        Mood and Affect: Mood normal.        Behavior: Behavior normal.      Outpatient Encounter Medications as of 03/16/2023  Medication Sig   amLODipine (NORVASC) 10 MG tablet TAKE 1 TABLET EVERY DAY   ASPIRIN 81 PO Take by mouth daily.   brimonidine (ALPHAGAN) 0.2 % ophthalmic solution 1 drop 2 (two) times daily.    clotrimazole-betamethasone (LOTRISONE) cream APPLY TO AFFECTED AREA TWICE A DAY   gabapentin (NEURONTIN) 100 MG capsule Take one capsule q am and one capsule q hs   hydrochlorothiazide (MICROZIDE) 12.5 MG capsule TAKE 1 CAPSULE BY MOUTH EVERY DAY   latanoprost (XALATAN) 0.005 % ophthalmic solution Place 1 drop into both eyes at bedtime.    lisinopril (ZESTRIL) 40 MG tablet TAKE 1 TABLET EVERY DAY   magnesium oxide (MAG-OX) 400 (240 Mg) MG tablet TAKE 1 TABLET BY MOUTH EVERY DAY   nystatin cream (MYCOSTATIN) Apply 1 application topically 2 (two) times daily.   potassium chloride (KLOR-CON M10) 10 MEQ tablet Take 1 tablet (10 mEq total) by mouth daily.   rosuvastatin (CRESTOR) 10 MG tablet TAKE 1 TABLET EVERY DAY   sulfaSALAzine (AZULFIDINE) 500 MG tablet Take  1,500 mg by mouth 2 (two) times daily. 3 tabs   timolol (TIMOPTIC) 0.5 % ophthalmic solution Place 1 drop into both eyes 2 (two) times daily.   traMADol (ULTRAM) 50 MG tablet Take 1 tablet (50 mg total) by mouth 2 (two) times daily. Each refill must last 30 days.   [DISCONTINUED] atenolol (TENORMIN) 50 MG tablet TAKE 1 TABLET EVERY DAY   No facility-administered encounter medications on file as of 03/16/2023.     Lab Results  Component Value Date   WBC 8.0 01/08/2023   HGB 11.7 (L) 01/08/2023   HCT 35.7 (L) 01/08/2023   PLT 187.0 01/08/2023   GLUCOSE 84 01/08/2023   CHOL 158 01/08/2023   TRIG 78.0 01/08/2023   HDL 67.00 01/08/2023   LDLDIRECT 132.5 05/06/2013   LDLCALC 75 01/08/2023   ALT 13 01/08/2023   AST 26 01/08/2023   NA 142 01/08/2023   K 4.1 03/09/2023   CL 103 01/08/2023   CREATININE 0.86 01/08/2023   BUN 17 01/08/2023   CO2 31 01/08/2023   TSH 2.58 08/26/2022   HGBA1C 5.7 10/19/2012       Assessment & Plan:  Bradycardia Assessment & Plan: EKG with high degree heart block as outlined.  Will hold atenolol.  Blood pressure recheck 130/68. Symptomatic with minimal exertion.  Increased fatigue and decreased energy.  Some sob.  Discussed with cardiology.  To ER for further evaluation.  Pt in agreement.  Husband to drive.  [facility] notified.   Orders: -     EKG 12-Lead  Crohn's disease of large intestine without complication (  HCC) Assessment & Plan: Stable.  Continue sulfasalazine.  Has been followed by GI ([facility]).     Hypercholesterolemia Assessment & Plan: Continue crestor.  Follow lipid panel and liver function tests.    Primary hypertension Assessment & Plan: Continue lisinopril, amlodipine.  Recently added hctz 12.5mg  q day.   Pressure is better.  Stop atenolol secondary to heart block. To ER as outlined.  Follow pressures. Follow metabolic panel.       Dale [place], MD

## 2023-03-16 NOTE — Telephone Encounter (Signed)
Call received from Dr. Lorin Picket requesting Dr. Azucena Cecil (DOD) review the patient's EKG from her office visit with Primary Care today showing a heart rate of 29 bpm.   EKG reviewed by Dr. Azucena Cecil and he agreed with EKG findings that the patient is in high degree AV Block (Mobitz II). He recommends that the patient be evaluated in the ER at [facility].   I called and spoke with Dr. Lorin Picket with Dr. Merita Norton recommendations.  She confirmed the patient is also on atenolol and did take her dose this morning. The patient is having symptoms.  Dr. Azucena Cecil was made aware and advised to discontinue atenolol and still have the patient evaluated at [facility].  Dr. Lorin Picket voices understanding of Dr. Merita Norton recommendations and is agreeable.

## 2023-03-16 NOTE — Progress Notes (Signed)
   03/16/23 1701  Vitals  Temp 99.1 F (37.3 C)  Temp Source Oral  BP (!) 198/57  MAP (mmHg) 97  BP Location Left Arm  BP Method Automatic  Patient Position (if appropriate) Sitting  Pulse Rate (!) 33  Pulse Rate Source Monitor  ECG Heart Rate (!) 34  Resp 18  MEWS COLOR  MEWS Score Color Yellow  Oxygen Therapy  SpO2 98 %  O2 Device Room Air  Pain Assessment  Pain Scale 0-10  Pain Score 0  MEWS Score  MEWS Temp 0  MEWS Systolic 0  MEWS Pulse 2  MEWS RR 0  MEWS LOC 0  MEWS Score 2   Patient arrived to the unit from the ED with 0/10 pain. Patient ambulated from stretcher to bathroom to bed with personal cane in hand. Patients VS obtained and stable. Tele monitor applied and CCMD notified.

## 2023-03-16 NOTE — ED Provider Notes (Signed)
[place] EMERGENCY DEPARTMENT AT [facility] Provider Note   CSN: [id number] Arrival date & time: 03/16/23  1254     History  Chief Complaint  Patient presents with   Abnormal ECG    Ellen Drake is a 77 y.o. female with history of hypertension, hypercholesterolemia, Crohn's disease, presents with concern for shortness of breath ongoing for last couple weeks.  Denies any chest pain, palpitations, dizziness, abdominal pain.  She was evaluated by her primary care doctor earlier today who found a heart degree block on EKG and told to come to the ER.  HPI     Home Medications Prior to Admission medications   Medication Sig Start Date End Date Taking? Authorizing Provider  amLODipine (NORVASC) 10 MG tablet TAKE 1 TABLET EVERY DAY 10/16/22   Dale [place], MD  ASPIRIN 81 PO Take by mouth daily.    [provider]  brimonidine (ALPHAGAN) 0.2 % ophthalmic solution 1 drop 2 (two) times daily.     [provider]  clotrimazole-betamethasone (LOTRISONE) cream APPLY TO AFFECTED AREA TWICE A DAY 03/14/21   Dale [place], MD  gabapentin (NEURONTIN) 100 MG capsule Take one capsule q am and one capsule q hs 05/21/22   Dale Hemphill, MD  hydrochlorothiazide (MICROZIDE) 12.5 MG capsule TAKE 1 CAPSULE BY MOUTH EVERY DAY 12/25/22   Dale Burns, MD  latanoprost (XALATAN) 0.005 % ophthalmic solution Place 1 drop into both eyes at bedtime.     [provider]  lisinopril (ZESTRIL) 40 MG tablet TAKE 1 TABLET EVERY DAY 01/13/22   Dale [place], MD  magnesium oxide (MAG-OX) 400 (240 Mg) MG tablet TAKE 1 TABLET BY MOUTH EVERY DAY 11/12/22   Dale Cromwell, MD  nystatin cream (MYCOSTATIN) Apply 1 application topically 2 (two) times daily. 05/11/19   Dale Dade, MD  potassium chloride (KLOR-CON M10) 10 MEQ tablet Take 1 tablet (10 mEq total) by mouth daily. 01/09/23   Dale Montfort, MD  rosuvastatin (CRESTOR) 10 MG tablet TAKE 1 TABLET EVERY DAY 10/16/22    Dale [place], MD  sulfaSALAzine (AZULFIDINE) 500 MG tablet Take 1,500 mg by mouth 2 (two) times daily. 3 tabs    [provider]  timolol (TIMOPTIC) 0.5 % ophthalmic solution Place 1 drop into both eyes 2 (two) times daily. 11/26/17   [provider]  traMADol (ULTRAM) 50 MG tablet Take 1 tablet (50 mg total) by mouth 2 (two) times daily. Each refill must last 30 days. 11/06/22 05/05/23  Delano Metz, MD      Allergies    Mercaptopurine, Lyrica [pregabalin], and Dilaudid [hydromorphone hcl]    Review of Systems   Review of Systems  Respiratory:  Positive for shortness of breath.     Physical Exam Updated Vital Signs BP (!) 144/42   Pulse (!) 32   Temp 97.7 F (36.5 C) (Oral)   Resp 18   Ht 5\' 6"  (1.676 m)   Wt 65.9 kg   LMP 08/13/1995   SpO2 100%   BMI 23.45 kg/m  Physical Exam Vitals and nursing note reviewed.  Constitutional:      General: She is not in acute distress.    Appearance: She is well-developed.  HENT:     Head: Normocephalic and atraumatic.  Eyes:     Conjunctiva/sclera: Conjunctivae normal.  Cardiovascular:     Rate and Rhythm: Regular rhythm. Bradycardia present.     Heart sounds: No murmur heard. Pulmonary:     Effort: Pulmonary effort is normal. No  respiratory distress.     Breath sounds: Normal breath sounds.  Abdominal:     Palpations: Abdomen is soft.     Tenderness: There is no abdominal tenderness.  Musculoskeletal:        General: No swelling.     Cervical back: Neck supple.  Skin:    General: Skin is warm and dry.     Capillary Refill: Capillary refill takes less than 2 seconds.  Neurological:     Mental Status: She is alert.  Psychiatric:        Mood and Affect: Mood normal.     ED Results / Procedures / Treatments   Labs (all labs ordered are listed, but only abnormal results are displayed) Labs Reviewed  BASIC METABOLIC PANEL  CBC  TSH    EKG EKG Interpretation Date/Time:  Monday March 16 2023  12:58:56 EDT Ventricular Rate:  30 PR Interval:  148 QRS Duration:  78 QT Interval:  540 QTC Calculation: 381 R Axis:   50  Text Interpretation: Complete vs Mobitz Type 2 heart block no acute ST changes Confirmed by Pricilla Loveless [phone]) on 03/16/2023 1:35:51 PM  Radiology DG Chest 2 View  Result Date: 03/16/2023 CLINICAL DATA:  cp EXAM: CHEST - 2 VIEW COMPARISON:  None Available. FINDINGS: No pleural effusion. No pneumothorax. No airspace opacity. Normal cardiac and mediastinal contours. No radiographically apparent displaced rib fractures. Visualized upper abdomen is unremarkable. Vertebral body heights are maintained. Partially imaged cervical spinal fusion hardware in place. Degenerative changes of the bilateral glenohumeral joints, left-greater-than-right. IMPRESSION: No focal airspace opacity. Electronically Signed   By: Lorenza Cambridge M.D.   On: 03/16/2023 14:14    Procedures Procedures    Medications Ordered in ED Medications  acetaminophen (TYLENOL) tablet 650 mg (has no administration in time range)  ondansetron (ZOFRAN) injection 4 mg (has no administration in time range)  enoxaparin (LOVENOX) injection 40 mg (has no administration in time range)  brimonidine (ALPHAGAN) 0.2 % ophthalmic solution 1 drop (has no administration in time range)  gabapentin (NEURONTIN) capsule 100 mg (has no administration in time range)  latanoprost (XALATAN) 0.005 % ophthalmic solution 1 drop (has no administration in time range)  rosuvastatin (CRESTOR) tablet 10 mg (has no administration in time range)  sulfaSALAzine (AZULFIDINE) tablet 1,500 mg (has no administration in time range)  timolol (TIMOPTIC) 0.5 % ophthalmic solution 1 drop (has no administration in time range)  hydrALAZINE (APRESOLINE) injection 2 mg (has no administration in time range)    ED Course/ Medical Decision Making/ A&P                                 Medical Decision Making Amount and/or Complexity of Data  Reviewed Labs: ordered. Radiology: ordered.  Risk Decision regarding hospitalization.   77 y.o. female with pertinent past medical history of hypertension, hyperlipidemia, Crohn's presents to the ED for concern of dyspnea ongoing for a couple weeks, now with new EKG finding  Differential diagnosis includes but is not limited to symptomatic bradycardia, heart block, ACS  ED Course:  Patient presents after being evaluated by her PCP earlier today for new onset shortness of breath for the past couple of weeks.  She was found to have a heart block on EKG and told to come to the ER for further evaluation.  She denies any other symptoms such as chest pain, palpitations, dizziness. Cardiology consulted who will come see the patient and  provide further recommendations.  Cardiology admitted the patient for further observation and management After discussion with cardiology, they do not feel she needs troponins.  These are canceled.  Impression: Symptomatic bradycardia Mobitz type II heart block  Disposition:  Admitted to cardiology service  Cardiac Monitoring: / EKG: The patient was maintained on a cardiac monitor.  I personally viewed and interpreted the cardiac monitored which showed an underlying rhythm of: Bradycardia, Mobitz 2 heart block   Consultations Obtained: I requested consultation with the Cardiology PA Perlie Gold,  and discussed lab and imaging findings as well as pertinent plan - they recommend: Observation during washout period of atenolol, obtain echocardiogram and check TSH   External records from outside source obtained and reviewed including PCP visit from earlier today   Co morbidities that complicate the patient evaluation  hypertension, hyperlipidemia, Crohn's             Final Clinical Impression(s) / ED Diagnoses Final diagnoses:  Mobitz type 2 second degree heart block    Rx / DC Orders ED Discharge Orders     None         Arabella Merles, PA-C 03/16/23 1458    Pricilla Loveless, MD 03/16/23 [phone]

## 2023-03-16 NOTE — ED Notes (Signed)
ED TO INPATIENT HANDOFF REPORT  ED Nurse Name and Phone #: , RN [phone]  S Name/Age/Gender Ellen Drake 77 y.o. female Room/Bed: 046C/046C  Code Status   Code Status: Full Code  Home/SNF/Other Home Patient oriented to: self, place, time, and situation Is this baseline? Yes   Triage Complete: Triage complete  Chief Complaint Second degree AV block, Mobitz type II [I44.1]  Triage Note Pt sent by PCP for abnormal EKG. Pt c/o dizziness and SOB w/walkingx2wks. Pt's PCP spoke w/cardiology and was told to sen her here.   Allergies Allergies  Allergen Reactions   Mercaptopurine Nausea Only and Other (See Comments)   Lyrica [Pregabalin]     Hand swelling   Dilaudid [Hydromorphone Hcl] Rash    Level of Care/Admitting Diagnosis ED Disposition     ED Disposition  Admit   Condition  --   Comment  Hospital Area: [facility] [[id number]]  Level of Care: Telemetry Cardiac [103]  May place patient in observation at [facility] or [facility] if equivalent level of care is available:: No  Covid Evaluation: Asymptomatic - no recent exposure (last 10 days) testing not required  Diagnosis: Second degree AV block, Mobitz type II [960454]  Admitting Physician: Jodelle Red [[id number]]  Attending Physician: Jodelle Red [[id number]]          B Medical/Surgery History Past Medical History:  Diagnosis Date   Anemia    reslved   Colon polyp 2009   Crohn's disease (HCC)    Diverticulosis    Fibrocystic breast disease    Herniated nucleus pulposus of lumbosacral region    History of chicken pox    Hypercholesterolemia    Hyperlipidemia    Hypertension    Osteoarthritis    Pulmonary nodule seen on imaging study 2004   resolved 2007   Past Surgical History:  Procedure Laterality Date   anterior cervical discectomy and fusion  1998   Dr Alvy Beal SURGERY  1992   ruptured disc (Dr Gerrit Heck)   BREAST BIOPSY Left    neg-no scar seen    CATARACT EXTRACTION W/PHACO Right 02/09/2019   Procedure: CATARACT EXTRACTION PHACO AND INTRAOCULAR LENS PLACEMENT (IOC) KAHOOK DUAL BLADE GONIOTOMY  RIGHT HEALON 5, VISION BLUE;  Surgeon: Lockie Mola, MD;  [facility];  Service: Ophthalmology;  Laterality: Right;   COLECTOMY  2003   COLON SURGERY  1998   COLONOSCOPY  2009   COLONOSCOPY W/ BIOPSIES  11/30/2012   Procedure: COLONOSCOPY W/BIOPSY; Surgeon: Benjie Karvonen, MD; Location: [facility]; Service: Gastroenterology;;   COLONOSCOPY W/ BIOPSIES  04/15/2016   Procedure: Colonoscopy ; Surgeon: Benjie Karvonen, MD; Location: [facility]; Service: Gastroenterology; Laterality: N/A;   KNEE ARTHROSCOPY W/ SYNOVECTOMY Right 03/10/2014   limited   KNEE SURGERY Right 1/16   [facility] (Dr. Ophelia Shoulder)   POSTERIOR LAMINECTOMY / DECOMPRESSION LUMBAR SPINE  1999   REPLACEMENT TOTAL KNEE     ROTATOR CUFF REPAIR Left 2007   acute open; accident     A IV Location/Drains/Wounds Patient Lines/Drains/Airways Status     Active Line/Drains/Airways     Name Placement date Placement time Site Days   Peripheral IV 03/16/23 20 G Left Antecubital 03/16/23  1408  Antecubital  less than 1            Intake/Output Last 24 hours No intake or output data in the 24 hours ending 03/16/23 1531  Labs/Imaging No results found for this  or any previous visit (from the past 48 hour(s)). DG Chest 2 View  Result Date: 03/16/2023 CLINICAL DATA:  cp EXAM: CHEST - 2 VIEW COMPARISON:  None Available. FINDINGS: No pleural effusion. No pneumothorax. No airspace opacity. Normal cardiac and mediastinal contours. No radiographically apparent displaced rib fractures. Visualized upper abdomen is unremarkable. Vertebral body heights are maintained. Partially imaged cervical spinal fusion hardware in place. Degenerative changes of the bilateral glenohumeral joints, left-greater-than-right. IMPRESSION: No focal airspace  opacity. Electronically Signed   By: Lorenza Cambridge M.D.   On: 03/16/2023 14:14    Pending Labs Unresulted Labs (From admission, onward)     Start     Ordered   03/23/23 0500  Creatinine, serum  (enoxaparin (LOVENOX)    CrCl >/= 30 ml/min)  Weekly,   R     Comments: while on enoxaparin therapy    03/16/23 1441   03/17/23 0500  Lipoprotein A (LPA)  Tomorrow morning,   R        03/16/23 1441   03/17/23 0500  Lipid panel  Tomorrow morning,   R        03/16/23 1441   03/17/23 0500  CBC  Tomorrow morning,   R        03/16/23 1441   03/17/23 0500  Basic metabolic panel  Tomorrow morning,   R        03/16/23 1441   03/16/23 1403  TSH  Once,   URGENT        03/16/23 1402   03/16/23 1310  Basic metabolic panel  Once,   STAT        03/16/23 1310   03/16/23 1310  CBC  Once,   STAT        03/16/23 1310            Vitals/Pain Today's Vitals   03/16/23 1301 03/16/23 1308  BP: (!) 144/42   Pulse: (!) 32   Resp: 18   Temp: 97.7 F (36.5 C)   TempSrc: Oral   SpO2: 100%   Weight:  65.9 kg  Height:  5\' 6"  (1.676 m)  PainSc:  0-No pain    Isolation Precautions No active isolations  Medications Medications  acetaminophen (TYLENOL) tablet 650 mg (has no administration in time range)  ondansetron (ZOFRAN) injection 4 mg (has no administration in time range)  enoxaparin (LOVENOX) injection 40 mg (has no administration in time range)  brimonidine (ALPHAGAN) 0.2 % ophthalmic solution 1 drop (has no administration in time range)  gabapentin (NEURONTIN) capsule 100 mg (has no administration in time range)  latanoprost (XALATAN) 0.005 % ophthalmic solution 1 drop (has no administration in time range)  rosuvastatin (CRESTOR) tablet 10 mg (has no administration in time range)  sulfaSALAzine (AZULFIDINE) tablet 1,500 mg (has no administration in time range)  timolol (TIMOPTIC) 0.5 % ophthalmic solution 1 drop (has no administration in time range)  hydrALAZINE (APRESOLINE) injection 2 mg  (has no administration in time range)    Mobility walks     Focused Assessments    R Recommendations: See Admitting Provider Note  Report given to:   Additional Notes: Patient recently stopped taking atenolol and will be observed to be sure that her heart rate responds as she gets the medication out of her system.

## 2023-03-16 NOTE — Progress Notes (Addendum)
   03/16/23 1751  Vitals  BP (!) 179/67  Pulse Rate (!) 32  ECG Heart Rate (!) 32  MEWS COLOR  MEWS Score Color Yellow  Oxygen Therapy  SpO2 96 %  Pain Assessment  Pain Scale 0-10  Pain Score 0  MEWS Score  MEWS Temp 0  MEWS Systolic 0  MEWS Pulse 2  MEWS RR 0  MEWS LOC 0  MEWS Score 2   RN administered 5mg  IV hydralazine for SBP > 170s. RN spoke to [place] of cardiology to note patient's HR has dropped to a HR of 29. Patient asymptomatic lying in bed. Plan of care continues. No pads applied to a patient per cardiology recs;

## 2023-03-16 NOTE — Assessment & Plan Note (Signed)
Continue lisinopril, amlodipine.  Recently added hctz 12.5mg  q day.   Pressure is better.  Stop atenolol secondary to heart block. To ER as outlined.  Follow pressures. Follow metabolic panel.

## 2023-03-16 NOTE — Assessment & Plan Note (Signed)
Continue crestor.  Follow lipid panel and liver function tests.  

## 2023-03-16 NOTE — ED Notes (Signed)
Report received from previous nurse

## 2023-03-16 NOTE — Consult Note (Signed)
Cardiology Consultation   Patient ID: Ellen Drake MRN: [id number]; DOB: 05-Nov-1945  Admit date: 03/16/2023 Date of Consult: 03/16/2023  PCP:  Ellen [place], MD   [facility] Providers Cardiologist:  None        Patient Profile:   Ellen Drake is a 77 y.o. female with a hx of hypertension, hyperlipidemia, Crohn's disease, fibrocystic breast disease who is being seen 03/16/2023 for the evaluation of symptomatic second degree type II AVB at the request of Ellen Drake.  History of Present Illness:   Ellen Drake was seen acutely today by her PCP for evaluation of increasing fatigue and exertional intolerance x2 weeks. In clinic, she was noted to be bradycardic and an ECG revealed HR in the 20s with second degree type II AVB. Patient's PCP Ellen Drake contacted Ellen Drake with [facility] and was advised to send patient to the ED.  In the ED, patient continues to have second degree type II AVB. Fortunately she is without symptoms while at rest. She confirms that about 2 weeks ago, she began to feel unusually fatigued and intolerant of exertion. She has been having to stop and take breaks after walking across a room, not normal for her. Denies any dizziness with ambulation. Also has not had chest pain or shortness of breath at rest/orthopnea. Denies history of thyroid disease. Admits to chronically feeling cold but otherwise denies symptoms of hypothyroidism such as weight gain, abnormal sleep, lower extremity edema, hair loss. No history of cardiovascular disease. She has been on Atenolol for years without issue.   Past Medical History:  Diagnosis Date   Anemia    reslved   Colon polyp 2009   Crohn's disease (HCC)    Diverticulosis    Fibrocystic breast disease    Herniated nucleus pulposus of lumbosacral region    History of chicken pox    Hypercholesterolemia    Hyperlipidemia    Hypertension    Osteoarthritis    Pulmonary nodule seen on imaging study 2004    resolved 2007    Past Surgical History:  Procedure Laterality Date   anterior cervical discectomy and fusion  1998   Dr Alvy Beal SURGERY  1992   ruptured disc (Dr Gerrit Heck)   BREAST BIOPSY Left    neg-no scar seen   CATARACT EXTRACTION W/PHACO Right 02/09/2019   Procedure: CATARACT EXTRACTION PHACO AND INTRAOCULAR LENS PLACEMENT (IOC) KAHOOK DUAL BLADE GONIOTOMY  RIGHT HEALON 5, VISION BLUE;  Surgeon: Lockie Mola, MD;  Location: [facility];  Service: Ophthalmology;  Laterality: Right;   COLECTOMY  2003   COLON SURGERY  1998   COLONOSCOPY  2009   COLONOSCOPY W/ BIOPSIES  11/30/2012   Procedure: COLONOSCOPY W/BIOPSY; Surgeon: Benjie Karvonen, MD; Location: [facility]; Service: Gastroenterology;;   COLONOSCOPY W/ BIOPSIES  04/15/2016   Procedure: Colonoscopy ; Surgeon: Benjie Karvonen, MD; Location: [facility]; Service: Gastroenterology; Laterality: N/A;   KNEE ARTHROSCOPY W/ SYNOVECTOMY Right 03/10/2014   limited   KNEE SURGERY Right 1/16   [facility] (Dr. Ophelia Shoulder)   POSTERIOR LAMINECTOMY / DECOMPRESSION LUMBAR SPINE  1999   REPLACEMENT TOTAL KNEE     ROTATOR CUFF REPAIR Left 2007   acute open; accident     Home Medications:  Prior to Admission medications   Medication Sig Start Date End Date Taking? Authorizing Provider  amLODipine (NORVASC) 10 MG tablet TAKE 1 TABLET EVERY DAY 10/16/22   Ellen [place], MD  ASPIRIN [phone]  PO Take by mouth daily.    [provider]  brimonidine (ALPHAGAN) 0.2 % ophthalmic solution 1 drop 2 (two) times daily.     [provider]  clotrimazole-betamethasone (LOTRISONE) cream APPLY TO AFFECTED AREA TWICE A DAY 03/14/21   Ellen [place], MD  gabapentin (NEURONTIN) 100 MG capsule Take one capsule q am and one capsule q hs 05/21/22   Ellen [place], MD  hydrochlorothiazide (MICROZIDE) 12.5 MG capsule TAKE 1 CAPSULE BY MOUTH EVERY DAY 12/25/22   Ellen [place], MD  latanoprost (XALATAN)  0.005 % ophthalmic solution Place 1 drop into both eyes at bedtime.     [provider]  lisinopril (ZESTRIL) 40 MG tablet TAKE 1 TABLET EVERY DAY 01/13/22   Ellen [place], MD  magnesium oxide (MAG-OX) 400 (240 Mg) MG tablet TAKE 1 TABLET BY MOUTH EVERY DAY 11/12/22   Ellen [place], MD  nystatin cream (MYCOSTATIN) Apply 1 application topically 2 (two) times daily. 05/11/19   Ellen [place], MD  potassium chloride (KLOR-CON M10) 10 MEQ tablet Take 1 tablet (10 mEq total) by mouth daily. 01/09/23   Ellen [place], MD  rosuvastatin (CRESTOR) 10 MG tablet TAKE 1 TABLET EVERY DAY 10/16/22   Ellen [place], MD  sulfaSALAzine (AZULFIDINE) 500 MG tablet Take 1,500 mg by mouth 2 (two) times daily. 3 tabs    [provider]  timolol (TIMOPTIC) 0.5 % ophthalmic solution Place 1 drop into both eyes 2 (two) times daily. 11/26/17   [provider]  traMADol (ULTRAM) 50 MG tablet Take 1 tablet (50 mg total) by mouth 2 (two) times daily. Each refill must last 30 days. 11/06/22 05/05/23  Ellen [place], MD    Inpatient Medications: Scheduled Meds:  Continuous Infusions:  PRN Meds:   Allergies:    Allergies  Allergen Reactions   Mercaptopurine Nausea Only and Other (See Comments)   Lyrica [Pregabalin]     Hand swelling   Dilaudid [Hydromorphone Hcl] Rash    Social History:   Social History   Socioeconomic History   Marital status: Married    Spouse name: Not on file   Number of children: 0   Years of education: 12   Highest education level: Not on file  Occupational History   Not on file  Tobacco Use   Smoking status: Former    Current packs/day: 0.00    Types: Cigarettes    Quit date: 09/10/2009    Years since quitting: 13.5   Smokeless tobacco: Never  Vaping Use   Vaping status: Never Used  Substance and Sexual Activity   Alcohol use: Yes    Alcohol/week: 1.0 standard drink of alcohol    Types: 1 Glasses of wine per week    Comment: wine occasional;  socially   Drug use: No   Sexual activity: Yes  Other Topics Concern   Not on file  Social History Narrative   Not on file   Social Determinants of Health   Financial Resource Strain: Low Risk  (07/18/2022)   Overall Financial Resource Strain (CARDIA)    Difficulty of Paying Living Expenses: Not hard at all  Food Insecurity: No Food Insecurity (07/18/2022)   Hunger Vital Sign    Worried About Running Out of Food in the Last Year: Never true    Ran Out of Food in the Last Year: Never true  Transportation Needs: No Transportation Needs (07/18/2022)   PRAPARE - Administrator, Civil Service (Medical): No    Lack of Transportation (Non-Medical): No  Physical Activity: Unknown (07/17/2021)   Exercise Vital Sign    Days of Exercise per Week: 0 days    Minutes of Exercise per Session: Not on file  Stress: No Stress Concern Present (07/18/2022)   [employer] of Occupational Health - Occupational Stress Questionnaire    Feeling of Stress : Not at all  Social Connections: Unknown (07/18/2022)   Social Connection and Isolation Panel [NHANES]    Frequency of Communication with Friends and Family: More than three times a week    Frequency of Social Gatherings with Friends and Family: More than three times a week    Attends Religious Services: Not on file    Active Member of Clubs or Organizations: Yes    Attends Banker Meetings: More than 4 times per year    Marital Status: Married  Catering manager Violence: Not At Risk (07/18/2022)   Humiliation, Afraid, Rape, and Kick questionnaire    Fear of Current or Ex-Partner: No    Emotionally Abused: No    Physically Abused: No    Sexually Abused: No    Family History:    Family History  Problem Relation Age of Onset   CVA Mother    Hypertension Mother    Hypertension Sister        brain tumor   Breast cancer Neg Hx    Colon cancer Neg Hx      ROS:  Please see the history of present illness.   All other  ROS reviewed and negative.     Physical Exam/Data:   Vitals:   03/16/23 1301 03/16/23 1308  BP: (!) 144/42   Pulse: (!) 32   Resp: 18   Temp: 97.7 F (36.5 C)   TempSrc: Oral   SpO2: 100%   Weight:  65.9 kg  Height:  5\' 6"  (1.676 m)   No intake or output data in the 24 hours ending 03/16/23 1347    03/16/2023    1:08 PM 03/16/2023   11:23 AM 01/08/2023   10:48 AM  Last 3 Weights  Weight (lbs) 145 lb 4.5 oz 145 lb 3.2 oz 145 lb  Weight (kg) 65.9 kg 65.862 kg 65.772 kg     Body mass index is 23.45 kg/m.  General:  Well nourished, well developed, in no acute distress HEENT: normal Neck: no JVD Vascular: No carotid bruits; Distal pulses 2+ bilaterally Cardiac:  normal S1, S2; bradycardia; no murmur  Lungs:  clear to auscultation bilaterally, no wheezing, rhonchi or rales  Abd: soft, nontender, no hepatomegaly  Ext: no edema Musculoskeletal:  No deformities, BUE and BLE strength normal and equal Skin: warm and dry  Neuro:  CNs 2-12 intact, no focal abnormalities noted Psych:  Normal affect   EKG:  The EKG was personally reviewed and demonstrates:  second degree type II AVG without acute ischemic changes Telemetry:  Telemetry was personally reviewed and demonstrates:  second degree type II AVB with HR upper 20s-upper 30s.   Relevant CV Studies:  No prior studies.  Laboratory Data:  High Sensitivity Troponin:  No results for input(s): "TROPONINIHS" in the last 720 hours.   ChemistryNo results for input(s): "NA", "K", "CL", "CO2", "GLUCOSE", "BUN", "CREATININE", "CALCIUM", "MG", "GFRNONAA", "GFRAA", "ANIONGAP" in the last 168 hours.  No results for input(s): "PROT", "ALBUMIN", "AST", "ALT", "ALKPHOS", "BILITOT" in the last 168 hours. Lipids No results for input(s): "CHOL", "TRIG", "HDL", "LABVLDL", "LDLCALC", "CHOLHDL" in the last 168 hours.  HematologyNo results for input(s): "WBC", "RBC", "HGB", "HCT", "  MCV", "MCH", "MCHC", "RDW", "PLT" in the last 168 hours. Thyroid No  results for input(s): "TSH", "FREET4" in the last 168 hours.  BNPNo results for input(s): "BNP", "PROBNP" in the last 168 hours.  DDimer No results for input(s): "DDIMER" in the last 168 hours.   Radiology/Studies:  No results found.   Assessment and Plan:   Second degree type II AVB (new)  Newly noted heart block at PCP office today where patient was seen for worsening fatigue and exertional tolerance x2 weeks.   Suspicious for conductive disease but will need Atenolol washout first before pacemaker consideration. With half-life of 6-7 hours, total washout timeframe likely 30-35 hours. If patient continues with heart block and symptoms of fatigue at that point, would need EP evaluation for pacemaker.  Check echocardiogram Check TSH with T3/T4 reflex.  Hypertension  Patient moderately hypertensive in the ED, 144/42. Her home regimen consists of Amlodipine 10mg , hydrochlorothiazide 12.5mg , Lisinopril 40mg , Atenolol 50mg .  Given symptomatic bradycardia, would favor tolerating higher BP as Atenolol washes out.   Hyperlipidemia  LDL direct 132 as of May 2024. Appears that patient has been on 10mg  Crestor for several years by chart review but patient reports restarting just a few months ago. LDL goal <100 in patient without CAD. Will check lipoprotein A and lipid panel in the morning. For now, continue with Crestor 10mg  dosing.   Crohn's disease  Continue home Sulfasalazine.  Risk Assessment/Risk Scores:                For questions or updates, please contact [facility] Please consult [email or web address] for contact info under    Signed, Perlie Gold, PA-C  03/16/2023 1:47 PM

## 2023-03-16 NOTE — ED Triage Notes (Signed)
Pt sent by PCP for abnormal EKG. Pt c/o dizziness and SOB w/walkingx2wks. Pt's PCP spoke w/cardiology and was told to sen her here.

## 2023-03-16 NOTE — Assessment & Plan Note (Addendum)
Stable.  Continue sulfasalazine.  Has been followed by GI ([facility]).

## 2023-03-16 NOTE — Patient Instructions (Signed)
Stop atenolol

## 2023-03-16 NOTE — Assessment & Plan Note (Signed)
EKG with high degree heart block as outlined.  Will hold atenolol.  Blood pressure recheck 130/68. Symptomatic with minimal exertion.  Increased fatigue and decreased energy.  Some sob.  Discussed with cardiology.  To ER for further evaluation.  Pt in agreement.  Husband to drive.  Ellen Drake ER notified.

## 2023-03-16 NOTE — H&P (Signed)
Please see consult note, which serves as H&P  Jodelle Red, MD, PhD, [facility] [place]  [facility]  [place]  [facility] at [facility] [address] [place], [place] [address] [phone]

## 2023-03-17 ENCOUNTER — Observation Stay (HOSPITAL_COMMUNITY): Payer: Medicare HMO

## 2023-03-17 DIAGNOSIS — Z96659 Presence of unspecified artificial knee joint: Secondary | ICD-10-CM | POA: Diagnosis present

## 2023-03-17 DIAGNOSIS — I700 Atherosclerosis of aorta: Secondary | ICD-10-CM | POA: Diagnosis not present

## 2023-03-17 DIAGNOSIS — Z9049 Acquired absence of other specified parts of digestive tract: Secondary | ICD-10-CM | POA: Diagnosis not present

## 2023-03-17 DIAGNOSIS — Z8249 Family history of ischemic heart disease and other diseases of the circulatory system: Secondary | ICD-10-CM | POA: Diagnosis not present

## 2023-03-17 DIAGNOSIS — I10 Essential (primary) hypertension: Secondary | ICD-10-CM | POA: Diagnosis present

## 2023-03-17 DIAGNOSIS — Z885 Allergy status to narcotic agent status: Secondary | ICD-10-CM | POA: Diagnosis not present

## 2023-03-17 DIAGNOSIS — R001 Bradycardia, unspecified: Secondary | ICD-10-CM | POA: Diagnosis present

## 2023-03-17 DIAGNOSIS — I441 Atrioventricular block, second degree: Secondary | ICD-10-CM | POA: Diagnosis present

## 2023-03-17 DIAGNOSIS — R6889 Other general symptoms and signs: Secondary | ICD-10-CM | POA: Diagnosis not present

## 2023-03-17 DIAGNOSIS — Z87891 Personal history of nicotine dependence: Secondary | ICD-10-CM | POA: Diagnosis not present

## 2023-03-17 DIAGNOSIS — R5383 Other fatigue: Secondary | ICD-10-CM | POA: Diagnosis not present

## 2023-03-17 DIAGNOSIS — I080 Rheumatic disorders of both mitral and aortic valves: Secondary | ICD-10-CM | POA: Diagnosis present

## 2023-03-17 DIAGNOSIS — Z79899 Other long term (current) drug therapy: Secondary | ICD-10-CM | POA: Diagnosis not present

## 2023-03-17 DIAGNOSIS — N6019 Diffuse cystic mastopathy of unspecified breast: Secondary | ICD-10-CM | POA: Diagnosis present

## 2023-03-17 DIAGNOSIS — Z981 Arthrodesis status: Secondary | ICD-10-CM | POA: Diagnosis not present

## 2023-03-17 DIAGNOSIS — Z961 Presence of intraocular lens: Secondary | ICD-10-CM | POA: Diagnosis present

## 2023-03-17 DIAGNOSIS — E7800 Pure hypercholesterolemia, unspecified: Secondary | ICD-10-CM | POA: Diagnosis present

## 2023-03-17 DIAGNOSIS — Z7982 Long term (current) use of aspirin: Secondary | ICD-10-CM | POA: Diagnosis not present

## 2023-03-17 DIAGNOSIS — N179 Acute kidney failure, unspecified: Secondary | ICD-10-CM | POA: Diagnosis not present

## 2023-03-17 DIAGNOSIS — Z888 Allergy status to other drugs, medicaments and biological substances status: Secondary | ICD-10-CM | POA: Diagnosis not present

## 2023-03-17 DIAGNOSIS — K5090 Crohn's disease, unspecified, without complications: Secondary | ICD-10-CM | POA: Diagnosis present

## 2023-03-17 DIAGNOSIS — Z823 Family history of stroke: Secondary | ICD-10-CM | POA: Diagnosis not present

## 2023-03-17 DIAGNOSIS — E876 Hypokalemia: Secondary | ICD-10-CM | POA: Diagnosis not present

## 2023-03-17 DIAGNOSIS — Z86010 Personal history of colonic polyps: Secondary | ICD-10-CM | POA: Diagnosis not present

## 2023-03-17 DIAGNOSIS — Z9841 Cataract extraction status, right eye: Secondary | ICD-10-CM | POA: Diagnosis not present

## 2023-03-17 LAB — LIPID PANEL
Cholesterol: 135 mg/dL (ref 0–200)
HDL: 62 mg/dL (ref >40)
LDL Cholesterol: 57 mg/dL (ref 0–99)
Total CHOL/HDL Ratio: 2.2 RATIO
Triglycerides: 82 mg/dL (ref <150)
VLDL: 16 mg/dL (ref 0–40)

## 2023-03-17 LAB — ECHOCARDIOGRAM COMPLETE
AR max vel: 2.06 cm2
AV Area VTI: 2.03 cm2
AV Area mean vel: 1.78 cm2
AV Mean grad: 10 mmHg
AV Peak grad: 19.9 mmHg
Ao pk vel: 2.23 m/s
Height: 66 in
S' Lateral: 2.8 cm
Weight: 2248 oz

## 2023-03-17 MED ORDER — TRAMADOL HCL 50 MG PO TABS
50.0000 mg | ORAL_TABLET | Freq: Two times a day (BID) | ORAL | Status: DC | PRN
  Administered 2023-03-17 – 2023-03-20 (×6): 50 mg via ORAL
  Filled 2023-03-17 (×6): qty 1

## 2023-03-17 NOTE — TOC Initial Note (Signed)
Transition of Care [facility]) - Initial/Assessment Note    Patient Details  Name: Ellen Drake MRN: [id number] Date of Birth: 09/10/1945  Transition of Care [facility]) CM/SW Contact:    Leone Haven, RN Phone Number: 03/17/2023, 11:52 AM  Clinical Narrative:                 From home with spouse, she has PCP, Dr. Lorin Picket, she has insurance on file.  She states she currently does not have any HH services in place at this time.  She has two canes that she uses when ambulating.  She states her spouse will transport her home at dc.  Her spouse and her sister, Darnelle Catalan is her support system. [facility] [place].   Expected Discharge Plan: Home w Home Health Services Barriers to Discharge: Continued Medical Work up   Patient Goals and CMS Choice Patient states their goals for this hospitalization and ongoing recovery are:: return home   Choice offered to / list presented to : NA      Expected Discharge Plan and Services In-house Referral: NA Discharge Planning Services: CM Consult Post Acute Care Choice: NA Living arrangements for the past 2 months: Single Family Home                 DME Arranged: N/A DME Agency: NA       HH Arranged: NA          Prior Living Arrangements/Services Living arrangements for the past 2 months: Single Family Home Lives with:: Spouse Patient language and need for interpreter reviewed:: Yes Do you feel safe going back to the place where you live?: Yes      Need for Family Participation in Patient Care: Yes (Comment) Care giver support system in place?: Yes (comment) Current home services: DME (2 canes) Criminal Activity/Legal Involvement Pertinent to Current Situation/Hospitalization: No - Comment as needed  Activities of Daily Living Home Assistive Devices/Equipment: Cane (specify quad or straight), Eyeglasses ADL Screening (condition at time of admission) Patient's cognitive ability adequate to safely complete  daily activities?: Yes Is the patient deaf or have difficulty hearing?: No Does the patient have difficulty seeing, even when wearing glasses/contacts?: No Does the patient have difficulty concentrating, remembering, or making decisions?: No Patient able to express need for assistance with ADLs?: Yes Does the patient have difficulty dressing or bathing?: No Independently performs ADLs?: Yes (appropriate for developmental age) Does the patient have difficulty walking or climbing stairs?: No Weakness of Legs: None Weakness of Arms/Hands: None  Permission Sought/Granted Permission sought to share information with : Case Manager Permission granted to share information with : Yes, Verbal Permission Granted              Emotional Assessment   Attitude/Demeanor/Rapport: Engaged Affect (typically observed): Appropriate Orientation: : Oriented to Self, Oriented to Place, Oriented to  Time, Oriented to Situation Alcohol / Substance Use: Not Applicable Psych Involvement: No (comment)  Admission diagnosis:  Mobitz type 2 second degree heart block [I44.1] Second degree AV block, Mobitz type II [I44.1] Patient Active Problem List   Diagnosis Date Noted   Bradycardia 03/16/2023   Second degree AV block, Mobitz type II 03/16/2023   Anemia 01/08/2023   Light headedness 10/15/2022   Leg cramps 10/15/2022   Falls, sequela 09/08/2022   Head trauma 08/26/2022   Left shoulder pain 12/24/2021   Cough 06/16/2021   Chronic use of opiate for therapeutic purpose 02/19/2021   Neurogenic pain  02/05/2021   Vitamin D deficiency 01/17/2021   Chronic knee pain s/p TKR (Right) 01/14/2021   Pain and swelling of knee (Right) 01/14/2021   Fibrocystic breast disease in female 01/13/2021   Chronic pain syndrome 01/13/2021   Pharmacologic therapy 01/13/2021   Disorder of skeletal system 01/13/2021   Problems influencing health status 01/13/2021   Skin lesion 03/17/2020   Shoulder pain, bilateral  03/16/2020   Right leg swelling 05/18/2019   Abdominal pain 05/18/2019   Vaginal burning 05/15/2019   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   Osteoarthritis of knee (Right) 03/23/2017   Vitamin B12 deficiency 12/17/2015   Health care maintenance 10/02/2014   Chronic knee pain (1ry area of Pain) (Right) 06/15/2014   Disorder of the skin and subcutaneous tissue, unspecified 06/15/2014   History of unicompartmental knee replacement (Right) 06/15/2014    Class: History of   Synovitis of knee 03/16/2014   Ear lesion 05/08/2013   Arthritis 05/08/2013   H/O adenomatous polyp of colon 11/29/2012   Hypertension 08/12/2012   Hypercholesterolemia 08/12/2012   Crohn's disease (HCC) 08/12/2012   S/P left colectomy 01/26/2003   Therapeutic drug monitoring 01/26/2003   Crohn's colitis (HCC) 01/25/2002   PCP:  Dale Cathcart, MD Pharmacy:   [phone] [place] [place] - [address] [place] [address] Phone: [phone] Fax: [phone]  [facility] - [place], [place] - [address] [place] [address] Phone: [phone] Fax: [phone]     Social Determinants of Health (SDOH) Social History: SDOH Screenings   Food Insecurity: No Food Insecurity (03/17/2023)  Housing: Low Risk  (03/17/2023)  Transportation Needs: No Transportation Needs (03/17/2023)  Utilities: Not At Risk (03/17/2023)  Depression (PHQ2-9): Low Risk  (11/03/2022)  Financial Resource Strain: Low Risk  (07/18/2022)  Physical Activity: Unknown (07/17/2021)  Social Connections: Unknown (07/18/2022)  Stress: No Stress Concern Present (07/18/2022)  Tobacco Use: Medium Risk (03/16/2023)   SDOH Interventions:     Readmission Risk Interventions     No data to display

## 2023-03-17 NOTE — Plan of Care (Signed)

## 2023-03-17 NOTE — Progress Notes (Signed)
  Echocardiogram 2D Echocardiogram has been performed.  Delcie Roch 03/17/2023, 5:12 PM

## 2023-03-17 NOTE — Progress Notes (Signed)
   03/17/23 1235  Vitals  Pulse Rate (!) 42  ECG Heart Rate (!) 42  MEWS COLOR  MEWS Score Color Green  Oxygen Therapy  SpO2 98 %  O2 Device Room Air  Pain Assessment  Pain Scale 0-10  Pain Score 9  Pain Type Chronic pain  Pain Location Knee  Pain Orientation Right  Pain Descriptors / Indicators Throbbing  Pain Frequency Intermittent  Pain Onset On-going  Pain Intervention(s) Medication (See eMAR)  MEWS Score  MEWS Temp 0  MEWS Systolic 0  MEWS Pulse 1  MEWS RR 0  MEWS LOC 0  MEWS Score 1   Patient called out to nurses station stating she is "out of breathe". RN rounded, patient shows no signs of distress, lying in bed with family at bedside. RN assessed lung sounds, clear in all lobes. Patient saturations is 98% on RA. Patient relayed to RN that she felt short of breathe while eating so "I stopped eating and it calmed down". Patient expresses 9/10 chronic knee pain, wanted home tramadol and refused PO tylenol. RN relayed to cardiology per patient's condition and med request. Cardiology to order PO tramadol and to continue to monitor patients.

## 2023-03-17 NOTE — Progress Notes (Signed)
   03/17/23 1749  Vitals  Temp 97.8 F (36.6 C)  Temp Source Oral  BP (!) 161/47  MAP (mmHg) 81  BP Location Left Arm  BP Method Automatic  Patient Position (if appropriate) Lying  Pulse Rate (!) 41  Pulse Rate Source Monitor  ECG Heart Rate (!) 40  Resp 16  MEWS COLOR  MEWS Score Color Green  Oxygen Therapy  SpO2 98 %  O2 Device Room Air  Pain Assessment  Pain Scale 0-10  Pain Score 0  MEWS Score  MEWS Temp 0  MEWS Systolic 0  MEWS Pulse 1  MEWS RR 0  MEWS LOC 0  MEWS Score 1   Patient notified nursing staff, chest discomfort post ECHO. RN rounded and obtain vitals and are stable. Patient relayed to RN during ECHO, "ECHO tech pressed hard on chest and neck". RN made aware. RN relayed to patient to notify staff if discomfort worsened. Patient verbalize understanding.

## 2023-03-17 NOTE — Plan of Care (Signed)
  Problem: Health Behavior/Discharge Planning: Goal: Ability to manage health-related needs will improve Outcome: Progressing   Problem: Clinical Measurements: Goal: Ability to maintain clinical measurements within normal limits will improve Outcome: Progressing   

## 2023-03-17 NOTE — Progress Notes (Signed)
Rounding Note    Patient Name: Ellen Drake Date of Encounter: 03/17/2023  [facility] Cardiologist: Jodelle Red, MD   Subjective   No acute events overnight. Remains in 2:1 AV block.  Inpatient Medications    Scheduled Meds:  brimonidine  1 drop Both Eyes BID   enoxaparin (LOVENOX) injection  40 mg Subcutaneous Q24H   latanoprost  1 drop Both Eyes QHS   rosuvastatin  10 mg Oral Daily   sulfaSALAzine  1,500 mg Oral BID   timolol  1 drop Both Eyes BID   Continuous Infusions:  PRN Meds: acetaminophen, hydrALAZINE, ondansetron (ZOFRAN) IV   Vital Signs    Vitals:   03/16/23 2000 03/17/23 0017 03/17/23 0430 03/17/23 0924  BP: (!) 161/38 (!) 166/49 (!) 174/52 (!) 130/46  Pulse:  (!) 36 (!) 47 (!) 42  Resp:  20 20 16   Temp:  98.4 F (36.9 C) 98 F (36.7 C) 98.4 F (36.9 C)  TempSrc:  Oral Oral Oral  SpO2:  97% 95% 97%  Weight:  63.7 kg    Height:        Intake/Output Summary (Last 24 hours) at 03/17/2023 1031 Last data filed at 03/17/2023 0820 Gross per 24 hour  Intake 420 ml  Output --  Net 420 ml      03/17/2023   12:17 AM 03/16/2023    1:08 PM 03/16/2023   11:23 AM  Last 3 Weights  Weight (lbs) 140 lb 8 oz 145 lb 4.5 oz 145 lb 3.2 oz  Weight (kg) 63.73 kg 65.9 kg 65.862 kg      Telemetry    2:1 AV block - Personally Reviewed  ECG    No new - Personally Reviewed  Physical Exam   GEN: No acute distress.   Neck: No JVD Cardiac: RRR, no murmurs, rubs, or gallops.  Respiratory: Clear to auscultation bilaterally. GI: Soft, nontender, non-distended  MS: No edema; No deformity. Neuro:  Nonfocal  Psych: Normal affect   Labs    High Sensitivity Troponin:  No results for input(s): "TROPONINIHS" in the last 720 hours.   Chemistry Recent Labs  Lab 03/16/23 1404 03/17/23 0042  NA 141 139  K 3.3* 3.1*  CL 107 105  CO2 24 24  GLUCOSE 82 104*  BUN 19 22  CREATININE 1.02* 1.09*  CALCIUM 9.3 9.5  GFRNONAA 57* 52*  ANIONGAP  10 10    Lipids  Recent Labs  Lab 03/17/23 0042  CHOL 135  TRIG 82  HDL 62  LDLCALC 57  CHOLHDL 2.2    Hematology Recent Labs  Lab 03/16/23 1404 03/17/23 0042  WBC 7.2 9.2  RBC 3.40* 3.38*  HGB 10.8* 11.1*  HCT 33.3* 33.1*  MCV 97.9 97.9  MCH 31.8 32.8  MCHC 32.4 33.5  RDW 13.7 13.8  PLT 169 173   Thyroid  Recent Labs  Lab 03/16/23 1404  TSH 1.789    BNPNo results for input(s): "BNP", "PROBNP" in the last 168 hours.  DDimer No results for input(s): "DDIMER" in the last 168 hours.   Radiology    DG Chest 2 View  Result Date: 03/16/2023 CLINICAL DATA:  cp EXAM: CHEST - 2 VIEW COMPARISON:  None Available. FINDINGS: No pleural effusion. No pneumothorax. No airspace opacity. Normal cardiac and mediastinal contours. No radiographically apparent displaced rib fractures. Visualized upper abdomen is unremarkable. Vertebral body heights are maintained. Partially imaged cervical spinal fusion hardware in place. Degenerative changes of the bilateral glenohumeral joints, left-greater-than-right.  IMPRESSION: No focal airspace opacity. Electronically Signed   By: Lorenza Cambridge M.D.   On: 03/16/2023 14:14    Cardiac Studies   Echo ordered  Patient Profile     77 y.o. female with a hx of hypertension, hyperlipidemia, Crohn's disease, fibrocystic breast disease who is admitted for the evaluation of symptomatic second degree type II AVB  Assessment & Plan    Exertional intolerance/fatigue Second degree AV block type II -hold nodal agents, last dose 03/16/23 AM. Will need 48 hour washout. If block persists, will need to consult EP for pacemaker placement -echo pending -TSH normal -have preliminarily discussed with EP; if remains in high grade block tomorrow AM (48 hours out), EP will see and discuss if pacemaker appropriate. Will make NPO at midnight tonight in case pacemaker needs to be implanted tomorrow. Discussed with patient, she is amenable.   Hypertension -allow for some  permissive hypertension given bradycardia  Hyperlipidemia -LDL direct 132 as of May 2024. Appears that patient has been on 10mg  Crestor for several years by chart review but patient reports restarting just a few months ago. LDL goal <100 in patient without CAD. Will check lipoprotein A and lipid panel in the morning. For now, continue with Crestor 10mg  dosing.    Crohn's disease  -Continue home Sulfasalazine.  For questions or updates, please contact [facility] Please consult [email or web address] for contact info under        Signed, Jodelle Red, MD  03/17/2023, 10:31 AM

## 2023-03-18 DIAGNOSIS — E876 Hypokalemia: Secondary | ICD-10-CM

## 2023-03-18 DIAGNOSIS — I441 Atrioventricular block, second degree: Secondary | ICD-10-CM | POA: Diagnosis not present

## 2023-03-18 LAB — CBC
HCT: 34.5 % — ABNORMAL LOW (ref 36.0–46.0)
Hemoglobin: 11.4 g/dL — ABNORMAL LOW (ref 12.0–15.0)
MCH: 32.1 pg (ref 26.0–34.0)
MCHC: 33 g/dL (ref 30.0–36.0)
MCV: 97.2 fL (ref 80.0–100.0)
Platelets: 195 10*3/uL (ref 150–400)
RBC: 3.55 MIL/uL — ABNORMAL LOW (ref 3.87–5.11)
RDW: 14.2 % (ref 11.5–15.5)
WBC: 8.4 10*3/uL (ref 4.0–10.5)
nRBC: 0 % (ref 0.0–0.2)

## 2023-03-18 LAB — BASIC METABOLIC PANEL
Anion gap: 6 (ref 5–15)
BUN: 25 mg/dL — ABNORMAL HIGH (ref 8–23)
CO2: 27 mmol/L (ref 22–32)
Calcium: 9.4 mg/dL (ref 8.9–10.3)
Chloride: 107 mmol/L (ref 98–111)
Creatinine, Ser: 1.11 mg/dL — ABNORMAL HIGH (ref 0.44–1.00)
GFR, Estimated: 51 mL/min — ABNORMAL LOW (ref >60)
Glucose, Bld: 109 mg/dL — ABNORMAL HIGH (ref 70–99)
Potassium: 4 mmol/L (ref 3.5–5.1)
Sodium: 140 mmol/L (ref 135–145)

## 2023-03-18 LAB — SURGICAL PCR SCREEN
MRSA, PCR: NEGATIVE
Staphylococcus aureus: NEGATIVE

## 2023-03-18 MED ORDER — CHLORHEXIDINE GLUCONATE 4 % EX SOLN
60.0000 mL | Freq: Once | CUTANEOUS | Status: AC
  Administered 2023-03-18: 4 via TOPICAL
  Filled 2023-03-18: qty 60

## 2023-03-18 MED ORDER — SODIUM CHLORIDE 0.9 % IV SOLN: INTRAVENOUS | Status: DC

## 2023-03-18 MED ORDER — SODIUM CHLORIDE 0.9% FLUSH
3.0000 mL | Freq: Two times a day (BID) | INTRAVENOUS | Status: DC
  Administered 2023-03-18 – 2023-03-20 (×4): 3 mL via INTRAVENOUS

## 2023-03-18 MED ORDER — SODIUM CHLORIDE 0.9% FLUSH: 3.0000 mL | INTRAVENOUS | Status: DC | PRN

## 2023-03-18 MED ORDER — POTASSIUM CHLORIDE CRYS ER 20 MEQ PO TBCR
80.0000 meq | EXTENDED_RELEASE_TABLET | Freq: Once | ORAL | Status: AC
  Administered 2023-03-18: 80 meq via ORAL
  Filled 2023-03-18: qty 4

## 2023-03-18 MED ORDER — SODIUM CHLORIDE 0.9 % IV SOLN: 250.0000 mL | INTRAVENOUS | Status: DC

## 2023-03-18 MED ORDER — CHLORHEXIDINE GLUCONATE 4 % EX SOLN
60.0000 mL | Freq: Once | CUTANEOUS | Status: AC
  Administered 2023-03-19: 4 via TOPICAL
  Filled 2023-03-18: qty 15

## 2023-03-18 NOTE — Progress Notes (Signed)
   03/18/23 1137  Vitals  Temp (!) 97.5 F (36.4 C)  Temp Source Oral  BP (!) 142/128  MAP (mmHg) 135  BP Location Right Arm  BP Method Automatic  Patient Position (if appropriate) Sitting  Pulse Rate (!) 39  Pulse Rate Source Monitor  ECG Heart Rate (!) 38  Resp 16  MEWS COLOR  MEWS Score Color Yellow  Oxygen Therapy  SpO2 99 %  O2 Device Room Air  Pain Assessment  Pain Scale 0-10  Pain Score 0  MEWS Score  MEWS Temp 0  MEWS Systolic 0  MEWS Pulse 2  MEWS RR 0  MEWS LOC 0  MEWS Score 2   Patient is chronic yellow MEWs due to decreased HR. Patient is in a 2:1 block and plan to have PPM 03/19/23. MD is aware. Plan of care continues.

## 2023-03-18 NOTE — Plan of Care (Signed)
  Problem: Clinical Measurements: Goal: Will remain free from infection Outcome: Progressing Goal: Diagnostic test results will improve Outcome: Progressing   Problem: Activity: Goal: Risk for activity intolerance will decrease Outcome: Progressing   

## 2023-03-18 NOTE — Progress Notes (Signed)
Rounding Note    Patient Name: Ellen Drake Date of Encounter: 03/18/2023  [facility] Cardiologist: Jodelle Red, MD   Subjective   Had some mild nausea this AM. Remains in 2:1 AV block. Was NPO, but on discussion with EP team no availability for pacer today, will place diet order and plan for device tomorrow.  Inpatient Medications    Scheduled Meds:  brimonidine  1 drop Both Eyes BID   latanoprost  1 drop Both Eyes QHS   potassium chloride  80 mEq Oral Once   rosuvastatin  10 mg Oral Daily   sulfaSALAzine  1,500 mg Oral BID   timolol  1 drop Both Eyes BID   Continuous Infusions:  PRN Meds: acetaminophen, hydrALAZINE, ondansetron (ZOFRAN) IV, traMADol   Vital Signs    Vitals:   03/18/23 0527 03/18/23 0731 03/18/23 0745 03/18/23 0800  BP:  (!) 172/48 (!) 140/48 (!) 154/47  Pulse:  (!) 39 (!) 44 (!) 42  Resp:  16    Temp:  97.7 F (36.5 C)    TempSrc:  Oral    SpO2:  94% 95% 95%  Weight: 65.8 kg     Height:        Intake/Output Summary (Last 24 hours) at 03/18/2023 0928 Last data filed at 03/17/2023 1750 Gross per 24 hour  Intake 120 ml  Output --  Net 120 ml      03/18/2023    5:27 AM 03/17/2023   12:17 AM 03/16/2023    1:08 PM  Last 3 Weights  Weight (lbs) 145 lb 1 oz 140 lb 8 oz 145 lb 4.5 oz  Weight (kg) 65.8 kg 63.73 kg 65.9 kg      Telemetry    2:1 AV block - Personally Reviewed  ECG    No new - Personally Reviewed  Physical Exam   GEN: Well nourished, well developed in no acute distress NECK: No JVD CARDIAC: regular rhythm, normal S1 and S2, no rubs or gallops. No murmur. VASCULAR: Radial pulses 2+ bilaterally.  RESPIRATORY:  Clear to auscultation without rales, wheezing or rhonchi  ABDOMEN: Soft, non-tender, non-distended MUSCULOSKELETAL:  Moves all 4 limbs independently SKIN: Warm and dry, no edema NEUROLOGIC:  No focal neuro deficits noted. PSYCHIATRIC:  Normal affect    Labs    High Sensitivity Troponin:  No  results for input(s): "TROPONINIHS" in the last 720 hours.   Chemistry Recent Labs  Lab 03/16/23 1404 03/17/23 0042  NA 141 139  K 3.3* 3.1*  CL 107 105  CO2 24 24  GLUCOSE 82 104*  BUN 19 22  CREATININE 1.02* 1.09*  CALCIUM 9.3 9.5  GFRNONAA 57* 52*  ANIONGAP 10 10    Lipids  Recent Labs  Lab 03/17/23 0042  CHOL 135  TRIG 82  HDL 62  LDLCALC 57  CHOLHDL 2.2    Hematology Recent Labs  Lab 03/16/23 1404 03/17/23 0042  WBC 7.2 9.2  RBC 3.40* 3.38*  HGB 10.8* 11.1*  HCT 33.3* 33.1*  MCV 97.9 97.9  MCH 31.8 32.8  MCHC 32.4 33.5  RDW 13.7 13.8  PLT 169 173   Thyroid  Recent Labs  Lab 03/16/23 1404  TSH 1.789    BNPNo results for input(s): "BNP", "PROBNP" in the last 168 hours.  DDimer No results for input(s): "DDIMER" in the last 168 hours.   Radiology    ECHOCARDIOGRAM COMPLETE  Result Date: 03/17/2023    ECHOCARDIOGRAM REPORT   Patient Name:  Ellen Drake Date of Exam: 03/17/2023 Medical Rec #:  [id number]         Height:       66.0 in Accession #:    [id number]        Weight:       140.5 lb Date of Birth:  May 01, 1946         BSA:          1.721 m Patient Age:    77 years          BP:           156/43 mmHg Patient Gender: F                 HR:           44 bpm. Exam Location:  Inpatient Procedure: 2D Echo, Cardiac Doppler and Color Doppler Indications:    second degree heart block  History:        Patient has no prior history of Echocardiogram examinations.                 Risk Factors:Hypertension and Dyslipidemia.  Sonographer:    Delcie Roch RDCS Referring Phys: [phone]   IMPRESSIONS  1. Intracavitary gradient. Peak gradient 1.7 m/s. Peak gradient 5.5 mmHg. Left ventricular ejection fraction, by estimation, is 60 to 65%. The left ventricle has normal function. The left ventricle has no regional wall motion abnormalities. Left ventricular diastolic parameters are consistent with Grade I diastolic dysfunction (impaired relaxation).  2.  Right ventricular systolic function is normal. The right ventricular size is normal.  3. Left atrial size was mildly dilated.  4. The mitral valve is normal in structure. Mild mitral valve regurgitation. No evidence of mitral stenosis.  5. Aortic outflow gradients are mildly elevated. The aortic valve appears normal. Cannot exclude a subvalvular or supravalvular membrane. The aortic valve is tricuspid. Aortic valve regurgitation is not visualized. Mild aortic valve stenosis. Aortic valve area, by VTI measures 2.03 cm. Aortic valve mean gradient measures 10.0 mmHg. Aortic valve Vmax measures 2.23 m/s.  6. The inferior vena cava is normal in size with greater than 50% respiratory variability, suggesting right atrial pressure of 3 mmHg. FINDINGS  Left Ventricle: Intracavitary gradient. Peak gradient 1.7 m/s. Peak gradient 5.5 mmHg. Left ventricular ejection fraction, by estimation, is 60 to 65%. The left ventricle has normal function. The left ventricle has no regional wall motion abnormalities.  The left ventricular internal cavity size was normal in size. There is no left ventricular hypertrophy. Left ventricular diastolic parameters are consistent with Grade I diastolic dysfunction (impaired relaxation). Right Ventricle: The right ventricular size is normal. No increase in right ventricular wall thickness. Right ventricular systolic function is normal. Left Atrium: Left atrial size was mildly dilated. Right Atrium: Right atrial size was normal in size. Pericardium: Trivial pericardial effusion is present. Mitral Valve: The mitral valve is normal in structure. Mild mitral valve regurgitation. No evidence of mitral valve stenosis. Tricuspid Valve: The tricuspid valve is normal in structure. Tricuspid valve regurgitation is not demonstrated. No evidence of tricuspid stenosis. Aortic Valve: Aortic outflow gradients are mildly elevated. The aortic valve appears normal. Cannot exclude a subvalvular or supravalvular  membrane. The aortic valve is tricuspid. Aortic valve regurgitation is not visualized. Mild aortic stenosis is present. Aortic valve mean gradient measures 10.0 mmHg. Aortic valve peak gradient measures 19.9 mmHg. Aortic valve area, by VTI measures 2.03 cm. Pulmonic Valve: The pulmonic valve was normal in structure. Pulmonic valve  regurgitation is not visualized. No evidence of pulmonic stenosis. Aorta: The aortic root is normal in size and structure. Venous: The inferior vena cava is normal in size with greater than 50% respiratory variability, suggesting right atrial pressure of 3 mmHg. IAS/Shunts: No atrial level shunt detected by color flow Doppler.  LEFT VENTRICLE PLAX 2D LVIDd:         4.20 cm   Diastology LVIDs:         2.80 cm   LV e' medial:  13.60 cm/s LV PW:         0.90 cm   LV e' lateral: 16.30 cm/s LV IVS:        0.90 cm LVOT diam:     1.80 cm LV SV:         112 LV SV Index:   65 LVOT Area:     2.54 cm  RIGHT VENTRICLE             IVC RV Basal diam:  2.50 cm     IVC diam: 1.40 cm RV S prime:     13.10 cm/s TAPSE (M-mode): 2.3 cm LEFT ATRIUM             Index        RIGHT ATRIUM          Index LA diam:        3.40 cm 1.98 cm/m   RA Area:     9.88 cm LA Vol (A2C):   67.8 ml 39.39 ml/m  RA Volume:   18.70 ml 10.87 ml/m LA Vol (A4C):   48.1 ml 27.95 ml/m LA Biplane Vol: 57.0 ml 33.12 ml/m  AORTIC VALVE AV Area (Vmax):    2.06 cm AV Area (Vmean):   1.78 cm AV Area (VTI):     2.03 cm AV Vmax:           223.00 cm/s AV Vmean:          147.000 cm/s AV VTI:            0.553 m AV Peak Grad:      19.9 mmHg AV Mean Grad:      10.0 mmHg LVOT Vmax:         180.50 cm/s LVOT Vmean:        103.000 cm/s LVOT VTI:          0.442 m LVOT/AV VTI ratio: 0.80  AORTA Ao Root diam: 2.60 cm Ao Asc diam:  2.70 cm  SHUNTS Systemic VTI:  0.44 m Systemic Diam: 1.80 cm Chilton Si MD Electronically signed by Chilton Si MD Signature Date/Time: 03/17/2023/5:30:16 PM    Final    DG Chest 2 View  Result Date:  03/16/2023 CLINICAL DATA:  cp EXAM: CHEST - 2 VIEW COMPARISON:  None Available. FINDINGS: No pleural effusion. No pneumothorax. No airspace opacity. Normal cardiac and mediastinal contours. No radiographically apparent displaced rib fractures. Visualized upper abdomen is unremarkable. Vertebral body heights are maintained. Partially imaged cervical spinal fusion hardware in place. Degenerative changes of the bilateral glenohumeral joints, left-greater-than-right. IMPRESSION: No focal airspace opacity. Electronically Signed   By: Lorenza Cambridge M.D.   On: 03/16/2023 14:14    Cardiac Studies   Echo reviewed  Patient Profile     77 y.o. female with a hx of hypertension, hyperlipidemia, Crohn's disease, fibrocystic breast disease who is admitted for the evaluation of symptomatic second degree type II AVB  Assessment & Plan    Exertional intolerance/fatigue Second degree  AV block type II -hold nodal agents, last dose 03/16/23 AM.  -now 48 hours out, consulted EP for pacemaker placement. No room today, likely placement tomorrow -echo with EF 60-65%, no severe valve disease -TSH normal   Hypertension -allow for some permissive hypertension given bradycardia  Hypokalemia -repleting today, ordered repeat BMET for early afternoon  Hyperlipidemia -LDL direct 132 as of May 2024. Appears that patient has been on 10mg  Crestor for several years by chart review but patient reports restarting just a few months ago. LDL goal <100 in patient without CAD. Will check lipoprotein A and lipid panel in the morning. For now, continue with Crestor 10mg  dosing.    Crohn's disease  -Continue home Sulfasalazine.  For questions or updates, please contact [facility] Please consult [email or web address] for contact info under        Signed, Jodelle Red, MD  03/18/2023, 9:28 AM

## 2023-03-18 NOTE — Consult Note (Addendum)
Cardiology Consultation   Patient ID: Ellen Drake MRN: [id number]; DOB: Dec 26, 1945  Admit date: 03/16/2023 Date of Consult: 03/18/2023  PCP:  Dale Solon, MD   [facility] Providers Cardiologist:  Jodelle Red, MD   {    Patient Profile:   Ellen Drake is a 77 y.o. female with a hx of Chron's disease, HTN, HLD who is being seen 03/18/2023 for the evaluation of advanced heart block at the request of Dr. Cristal Deer.  History of Present Illness:   Ellen Drake sought attention with her PMD 2/2 progressive fatigue, worsened exertional capacity found to be bradycardic and sent to the ER She was found in 2:1 AV block, admitted her home Atenolol held.  Despite drug wash out no >48 hours she remains in heart block and EP is asked on board  BP has remained stable  LABS K+ 3.3 > 3.2 BUN/Creat 22/1.09 (baseline looks about 0.9-1.0) WBC 9.2 H/H 11/33 Plts 173 TSH 1.789  HOME MEDS Atenolol 50mg  daily (last dose 8/5 morning) Timolol No other potential nodal blocking agents noted  She has had some mild nausea and reflux this AM, starting to ease off No CP, SOB, not lightheaded or lethargic She denies any hx of dizziness, lightheaded, no syncope, though for 3+ weeks or so she has noted a clear reduction in exertional capacity, generally weak, fatigued, unusually tired. Simple tasks making her feel winded, very tired.   Past Medical History:  Diagnosis Date   Anemia    reslved   Colon polyp 2009   Crohn's disease (HCC)    Diverticulosis    Fibrocystic breast disease    Herniated nucleus pulposus of lumbosacral region    History of chicken pox    Hypercholesterolemia    Hyperlipidemia    Hypertension    Osteoarthritis    Pulmonary nodule seen on imaging study 2004   resolved 2007    Past Surgical History:  Procedure Laterality Date   anterior cervical discectomy and fusion  1998   Dr Alvy Beal SURGERY  1992   ruptured disc (Dr  Gerrit Heck)   BREAST BIOPSY Left    neg-no scar seen   CATARACT EXTRACTION W/PHACO Right 02/09/2019   Procedure: CATARACT EXTRACTION PHACO AND INTRAOCULAR LENS PLACEMENT (IOC) KAHOOK DUAL BLADE GONIOTOMY  RIGHT HEALON 5, VISION BLUE;  Surgeon: Lockie Mola, MD;  Location: [facility];  Service: Ophthalmology;  Laterality: Right;   COLECTOMY  2003   COLON SURGERY  1998   COLONOSCOPY  2009   COLONOSCOPY W/ BIOPSIES  11/30/2012   Procedure: COLONOSCOPY W/BIOPSY; Surgeon: Benjie Karvonen, MD; Location: [facility]; Service: Gastroenterology;;   COLONOSCOPY W/ BIOPSIES  04/15/2016   Procedure: Colonoscopy ; Surgeon: Benjie Karvonen, MD; Location: [facility]; Service: Gastroenterology; Laterality: N/A;   KNEE ARTHROSCOPY W/ SYNOVECTOMY Right 03/10/2014   limited   KNEE SURGERY Right 1/16   [facility] (Dr. Ophelia Shoulder)   POSTERIOR LAMINECTOMY / DECOMPRESSION LUMBAR SPINE  1999   REPLACEMENT TOTAL KNEE     ROTATOR CUFF REPAIR Left 2007   acute open; accident     Home Medications:  Prior to Admission medications   Medication Sig Start Date End Date Taking? Authorizing Provider  amLODipine (NORVASC) 10 MG tablet TAKE 1 TABLET EVERY DAY 10/16/22  Yes Dale [place], MD  ASPIRIN 81 PO Take 1 tablet by mouth daily.   Yes [provider]  atenolol (TENORMIN) 50 MG tablet Take 50 mg by  mouth daily.   Yes [provider]  hydrochlorothiazide (MICROZIDE) 12.5 MG capsule TAKE 1 CAPSULE BY MOUTH EVERY DAY 12/25/22  Yes Dale [place], MD  latanoprost (XALATAN) 0.005 % ophthalmic solution Place 1 drop into both eyes at bedtime.    Yes [provider]  potassium chloride (KLOR-CON M10) 10 MEQ tablet Take 1 tablet (10 mEq total) by mouth daily. Patient taking differently: Take 20 mEq by mouth daily. 01/09/23  Yes Dale [place], MD  rosuvastatin (CRESTOR) 10 MG tablet TAKE 1 TABLET EVERY DAY 10/16/22  Yes Dale [place], MD  sulfaSALAzine  (AZULFIDINE) 500 MG tablet Take 1,500 mg by mouth 2 (two) times daily. 3 tabs   Yes [provider]  timolol (TIMOPTIC) 0.5 % ophthalmic solution Place 1 drop into both eyes 2 (two) times daily. 11/26/17  Yes [provider]  traMADol (ULTRAM) 50 MG tablet Take 1 tablet (50 mg total) by mouth 2 (two) times daily. Each refill must last 30 days. 11/06/22 05/05/23 Yes Delano Metz, MD  triamcinolone cream (KENALOG) 0.1 % Apply 1 Application topically as needed (prn). As needed 02/06/23  Yes [provider]  magnesium oxide (MAG-OX) 400 (240 Mg) MG tablet TAKE 1 TABLET BY MOUTH EVERY DAY 11/12/22   Dale [place], MD    Inpatient Medications: Scheduled Meds:  brimonidine  1 drop Both Eyes BID   latanoprost  1 drop Both Eyes QHS   potassium chloride  80 mEq Oral Once   rosuvastatin  10 mg Oral Daily   sulfaSALAzine  1,500 mg Oral BID   timolol  1 drop Both Eyes BID   Continuous Infusions:  PRN Meds: acetaminophen, hydrALAZINE, ondansetron (ZOFRAN) IV, traMADol  Allergies:    Allergies  Allergen Reactions   Mercaptopurine Nausea Only and Other (See Comments)   Lyrica [Pregabalin]     Hand swelling   Dilaudid [Hydromorphone Hcl] Rash    Social History:   Social History   Socioeconomic History   Marital status: Married    Spouse name: Not on file   Number of children: 0   Years of education: 12   Highest education level: Not on file  Occupational History   Not on file  Tobacco Use   Smoking status: Former    Current packs/day: 0.00    Types: Cigarettes    Quit date: 09/10/2009    Years since quitting: 13.5   Smokeless tobacco: Never  Vaping Use   Vaping status: Never Used  Substance and Sexual Activity   Alcohol use: Yes    Alcohol/week: 1.0 standard drink of alcohol    Types: 1 Glasses of wine per week    Comment: wine occasional; socially   Drug use: No   Sexual activity: Yes  Other Topics Concern   Not on file  Social History Narrative    Not on file   Social Determinants of Health   Financial Resource Strain: Low Risk  (07/18/2022)   Overall Financial Resource Strain (CARDIA)    Difficulty of Paying Living Expenses: Not hard at all  Food Insecurity: No Food Insecurity (03/17/2023)   Hunger Vital Sign    Worried About Running Out of Food in the Last Year: Never true    Ran Out of Food in the Last Year: Never true  Transportation Needs: No Transportation Needs (03/17/2023)   PRAPARE - Administrator, Civil Service (Medical): No    Lack of Transportation (Non-Medical): No  Physical Activity: Unknown (07/17/2021)   Exercise Vital Sign  Days of Exercise per Week: 0 days    Minutes of Exercise per Session: Not on file  Stress: No Stress Concern Present (07/18/2022)   [employer] of Occupational Health - Occupational Stress Questionnaire    Feeling of Stress : Not at all  Social Connections: Unknown (07/18/2022)   Social Connection and Isolation Panel [NHANES]    Frequency of Communication with Friends and Family: More than three times a week    Frequency of Social Gatherings with Friends and Family: More than three times a week    Attends Religious Services: Not on file    Active Member of Clubs or Organizations: Yes    Attends Banker Meetings: More than 4 times per year    Marital Status: Married  Catering manager Violence: Not At Risk (03/17/2023)   Humiliation, Afraid, Rape, and Kick questionnaire    Fear of Current or Ex-Partner: No    Emotionally Abused: No    Physically Abused: No    Sexually Abused: No    Family History:    Family History  Problem Relation Age of Onset   CVA Mother    Hypertension Mother    Hypertension Sister        brain tumor   Breast cancer Neg Hx    Colon cancer Neg Hx      ROS:  Please see the history of present illness.  All other ROS reviewed and negative.     Physical Exam/Data:   Vitals:   03/18/23 0527 03/18/23 0731 03/18/23 0745 03/18/23  0800  BP:  (!) 172/48 (!) 140/48 (!) 154/47  Pulse:  (!) 39 (!) 44 (!) 42  Resp:  16    Temp:  97.7 F (36.5 C)    TempSrc:  Oral    SpO2:  94% 95% 95%  Weight: 65.8 kg     Height:        Intake/Output Summary (Last 24 hours) at 03/18/2023 0943 Last data filed at 03/17/2023 1750 Gross per 24 hour  Intake 120 ml  Output --  Net 120 ml      03/18/2023    5:27 AM 03/17/2023   12:17 AM 03/16/2023    1:08 PM  Last 3 Weights  Weight (lbs) 145 lb 1 oz 140 lb 8 oz 145 lb 4.5 oz  Weight (kg) 65.8 kg 63.73 kg 65.9 kg     Body mass index is 23.41 kg/m.  General:  Well nourished, well developed, in no acute distress HEENT: normal Neck: no JVD Vascular: No carotid bruits Cardiac:  RRR; no murmurs, gallops or rubs Lungs: CTA b/l, no wheezing, rhonchi or rales  Abd: soft, nontender Ext: no edema Musculoskeletal:  No deformities Skin: warm and dry  Neuro:  no gross focal abnormalities noted Psych:  Normal affect   EKG:  The EKG was personally reviewed and demonstrates:   2:1 AVblock 30bpm, narrow QRS, baseline artifact  OLD 11-06-2045 SR 74bpm, IVCD  Telemetry:  Telemetry was personally reviewed and demonstrates:   2:1 AVblock, 40's   Relevant CV Studies:   03/17/23: TTE  1. Intracavitary gradient. Peak gradient 1.7 m/s. Peak gradient 5.5 mmHg.  Left ventricular ejection fraction, by estimation, is 60 to 65%. The left  ventricle has normal function. The left ventricle has no regional wall  motion abnormalities. Left  ventricular diastolic parameters are consistent with Grade I diastolic  dysfunction (impaired relaxation).   2. Right ventricular systolic function is normal. The right ventricular  size is normal.  3. Left atrial size was mildly dilated.   4. The mitral valve is normal in structure. Mild mitral valve  regurgitation. No evidence of mitral stenosis.   5. Aortic outflow gradients are mildly elevated. The aortic valve appears  normal. Cannot exclude a subvalvular or  supravalvular membrane. The aortic  valve is tricuspid. Aortic valve regurgitation is not visualized. Mild  aortic valve stenosis. Aortic  valve area, by VTI measures 2.03 cm. Aortic valve mean gradient measures  10.0 mmHg. Aortic valve Vmax measures 2.23 m/s.   6. The inferior vena cava is normal in size with greater than 50%  respiratory variability, suggesting right atrial pressure of 3 mmHg.    Laboratory Data:  High Sensitivity Troponin:  No results for input(s): "TROPONINIHS" in the last 720 hours.   Chemistry Recent Labs  Lab 03/16/23 1404 03/17/23 0042  NA 141 139  K 3.3* 3.1*  CL 107 105  CO2 24 24  GLUCOSE 82 104*  BUN 19 22  CREATININE 1.02* 1.09*  CALCIUM 9.3 9.5  GFRNONAA 57* 52*  ANIONGAP 10 10    No results for input(s): "PROT", "ALBUMIN", "AST", "ALT", "ALKPHOS", "BILITOT" in the last 168 hours. Lipids  Recent Labs  Lab 03/17/23 0042  CHOL 135  TRIG 82  HDL 62  LDLCALC 57  CHOLHDL 2.2    Hematology Recent Labs  Lab 03/16/23 1404 03/17/23 0042  WBC 7.2 9.2  RBC 3.40* 3.38*  HGB 10.8* 11.1*  HCT 33.3* 33.1*  MCV 97.9 97.9  MCH 31.8 32.8  MCHC 32.4 33.5  RDW 13.7 13.8  PLT 169 173   Thyroid  Recent Labs  Lab 03/16/23 1404  TSH 1.789    BNPNo results for input(s): "BNP", "PROBNP" in the last 168 hours.  DDimer No results for input(s): "DDIMER" in the last 168 hours.   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 Hypokalemia Already addressed by attending team     Risk Assessment/Risk Scores:     For questions or updates, please contact [facility] Please consult [email or web address] for contact  info under    Signed, Sheilah Pigeon, PA-C  03/18/2023 9:43 AM  I have seen and examined this patient with [name].  Agree with above, note added to reflect my findings.  Patient with a history as above presented to the hospital with progressive fatigue and worsening shortness of breath.  EKG showed 2-1 AV block.  She is currently on atenolol which has been stopped.  Despite drug washout for greater than 48 hours, she is continue to have 2-1 AV block.  This morning she feels mildly fatigued with some nausea, but otherwise without complaint.  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  Atenolol has had time to washout.  She  need pacemaker  implant.   plan for tomorrow. Hypokalemia: Repletion per primary team   M.  MD 03/18/2023 10:43 AM

## 2023-03-19 ENCOUNTER — Inpatient Hospital Stay (HOSPITAL_COMMUNITY)
Admission: EM | Disposition: A | Discharge status: Home / Self Care | Payer: Self-pay | Source: Home / Self Care | Attending: Cardiology

## 2023-03-19 DIAGNOSIS — I441 Atrioventricular block, second degree: Secondary | ICD-10-CM | POA: Diagnosis not present

## 2023-03-19 HISTORY — PX: PACEMAKER IMPLANT: EP1218

## 2023-03-19 SURGERY — PACEMAKER IMPLANT

## 2023-03-19 MED ORDER — HYDRALAZINE HCL 20 MG/ML IJ SOLN
INTRAMUSCULAR | Status: DC | PRN
  Administered 2023-03-19: 5 mg via INTRAVENOUS

## 2023-03-19 MED ORDER — SODIUM CHLORIDE 0.9 % IV SOLN
INTRAVENOUS | Status: AC
  Filled 2023-03-19: qty 2

## 2023-03-19 MED ORDER — CEFAZOLIN SODIUM-DEXTROSE 2-4 GM/100ML-% IV SOLN
2.0000 g | INTRAVENOUS | Status: AC
  Administered 2023-03-19: 2 g via INTRAVENOUS

## 2023-03-19 MED ORDER — LIDOCAINE HCL (PF) 1 % IJ SOLN
INTRAMUSCULAR | Status: DC | PRN
  Administered 2023-03-19: 80 mL

## 2023-03-19 MED ORDER — HEPARIN (PORCINE) IN NACL 1000-0.9 UT/500ML-% IV SOLN
INTRAVENOUS | Status: DC | PRN
  Administered 2023-03-19: 500 mL

## 2023-03-19 MED ORDER — CEFAZOLIN SODIUM-DEXTROSE 2-4 GM/100ML-% IV SOLN
INTRAVENOUS | Status: AC
  Filled 2023-03-19: qty 100

## 2023-03-19 MED ORDER — LIDOCAINE HCL 1 % IJ SOLN
INTRAMUSCULAR | Status: AC
  Filled 2023-03-19: qty 20

## 2023-03-19 MED ORDER — LIDOCAINE HCL 1 % IJ SOLN
INTRAMUSCULAR | Status: AC
  Filled 2023-03-19: qty 60

## 2023-03-19 MED ORDER — HYDRALAZINE HCL 20 MG/ML IJ SOLN
INTRAMUSCULAR | Status: AC
  Filled 2023-03-19: qty 1

## 2023-03-19 MED ORDER — CEFAZOLIN SODIUM-DEXTROSE 1-4 GM/50ML-% IV SOLN
1.0000 g | Freq: Four times a day (QID) | INTRAVENOUS | Status: AC
  Administered 2023-03-19 – 2023-03-20 (×3): 1 g via INTRAVENOUS
  Filled 2023-03-19 (×3): qty 50

## 2023-03-19 MED ORDER — SODIUM CHLORIDE 0.9 % IV SOLN
80.0000 mg | INTRAVENOUS | Status: AC
  Administered 2023-03-19: 80 mg

## 2023-03-19 MED ORDER — HYDRALAZINE HCL 20 MG/ML IJ SOLN: 5.0000 mg | INTRAMUSCULAR | Status: DC | PRN

## 2023-03-19 MED ORDER — AMLODIPINE BESYLATE 10 MG PO TABS
10.0000 mg | ORAL_TABLET | Freq: Every day | ORAL | Status: DC
  Administered 2023-03-19 – 2023-03-20 (×2): 10 mg via ORAL
  Filled 2023-03-19 (×2): qty 1

## 2023-03-19 SURGICAL SUPPLY — 14 items
CABLE SURGICAL S-101-97-12 (CABLE) ×2 IMPLANT
CATH RIGHTSITE C315HIS02 (CATHETERS) IMPLANT
IPG PACE AZUR XT DR MRI W1DR01 (Pacemaker) IMPLANT
LEAD CAPSURE NOVUS 5076-52CM (Lead) IMPLANT
LEAD SELECT SECURE 3830 383069 (Lead) IMPLANT
PACE AZURE XT DR MRI W1DR01 (Pacemaker) ×1 IMPLANT
PAD DEFIB RADIO PHYSIO CONN (PAD) ×2 IMPLANT
SELECT SECURE 3830 383069 (Lead) ×1 IMPLANT
SHEATH 7FR PRELUDE SNAP 13 (SHEATH) IMPLANT
SHEATH 7FR PRELUDE SNAP 25 (SHEATH) IMPLANT
SHEATH PROBE COVER 6X72 (BAG) IMPLANT
SLITTER 6232ADJ (MISCELLANEOUS) IMPLANT
TRAY PACEMAKER INSERTION (PACKS) ×2 IMPLANT
WIRE HI TORQ VERSACORE-J 145CM (WIRE) IMPLANT

## 2023-03-19 NOTE — H&P (View-Only) (Signed)
Rounding Note    Patient Name: Ellen Drake Date of Encounter: 03/19/2023  [facility] Cardiologist: Jodelle Red, MD   Subjective   Feels well, no ongoing nausea, no CP, SOB  Inpatient Medications    Scheduled Meds:  brimonidine  1 drop Both Eyes BID   chlorhexidine  60 mL Topical Once   gentamicin (GARAMYCIN) 80 mg in sodium chloride 0.9 % 500 mL irrigation  80 mg Irrigation To SSTC   latanoprost  1 drop Both Eyes QHS   rosuvastatin  10 mg Oral Daily   sodium chloride flush  3 mL Intravenous Q12H   sulfaSALAzine  1,500 mg Oral BID   timolol  1 drop Both Eyes BID   Continuous Infusions:  sodium chloride 50 mL/hr at 03/19/23 0549   sodium chloride      ceFAZolin (ANCEF) IV     PRN Meds: acetaminophen, hydrALAZINE, ondansetron (ZOFRAN) IV, sodium chloride flush, traMADol   Vital Signs    Vitals:   03/19/23 0014 03/19/23 0414 03/19/23 0738 03/19/23 0755  BP: (!) 143/41 (!) 153/45 (!) 158/52 (!) 152/44  Pulse: (!) 43 (!) 40 (!) 40 (!) 40  Resp: 18 18 17    Temp: 97.8 F (36.6 C) 98.6 F (37 C) 98.4 F (36.9 C)   TempSrc: Oral Oral Oral   SpO2: 93% 95% 94% 94%  Weight:  63.7 kg    Height:        Intake/Output Summary (Last 24 hours) at 03/19/2023 0850 Last data filed at 03/18/2023 2209 Gross per 24 hour  Intake 460 ml  Output --  Net 460 ml      03/19/2023    4:14 AM 03/18/2023    5:27 AM 03/17/2023   12:17 AM  Last 3 Weights  Weight (lbs) 140 lb 8 oz 145 lb 1 oz 140 lb 8 oz  Weight (kg) 63.73 kg 65.8 kg 63.73 kg      Telemetry    Remains in 2:1 AVblock, 40's - Personally Reviewed  ECG    No new EKGs - Personally Reviewed  Physical Exam   GEN: No acute distress.   Neck: No JVD Cardiac: RRR, bradycardic,  no murmurs, rubs, or gallops.  Respiratory: CTA b/l. GI: Soft, nontender, non-distended  MS: No edema; No deformity. Neuro:  Nonfocal  Psych: Normal affect   Labs    High Sensitivity Troponin:  No results for input(s):  "TROPONINIHS" in the last 720 hours.   Chemistry Recent Labs  Lab 03/17/23 0042 03/18/23 1250 03/19/23 0046  NA 139 140 140  K 3.1* 4.0 4.1  CL 105 107 110  CO2 24 27 23   GLUCOSE 104* 109* 99  BUN 22 25* 31*  CREATININE 1.09* 1.11* 1.35*  CALCIUM 9.5 9.4 9.1  GFRNONAA 52* 51* 40*  ANIONGAP 10 6 7     Lipids  Recent Labs  Lab 03/17/23 0042  CHOL 135  TRIG 82  HDL 62  LDLCALC 57  CHOLHDL 2.2    Hematology Recent Labs  Lab 03/17/23 0042 03/18/23 1250 03/19/23 0046  WBC 9.2 8.4 8.5  RBC 3.38* 3.55* 3.18*  HGB 11.1* 11.4* 10.3*  HCT 33.1* 34.5* 31.3*  MCV 97.9 97.2 98.4  MCH 32.8 32.1 32.4  MCHC 33.5 33.0 32.9  RDW 13.8 14.2 14.2  PLT 173 195 165   Thyroid  Recent Labs  Lab 03/16/23 1404  TSH 1.789    BNPNo results for input(s): "BNP", "PROBNP" in the last 168 hours.  DDimer No  results for input(s): "DDIMER" in the last 168 hours.   Radiology      Cardiac Studies   03/17/23: TTE  1. Intracavitary gradient. Peak gradient 1.7 m/s. Peak gradient 5.5 mmHg.  Left ventricular ejection fraction, by estimation, is 60 to 65%. The left  ventricle has normal function. The left ventricle has no regional wall  motion abnormalities. Left  ventricular diastolic parameters are consistent with Grade I diastolic  dysfunction (impaired relaxation).   2. Right ventricular systolic function is normal. The right ventricular  size is normal.   3. Left atrial size was mildly dilated.   4. The mitral valve is normal in structure. Mild mitral valve  regurgitation. No evidence of mitral stenosis.   5. Aortic outflow gradients are mildly elevated. The aortic valve appears  normal. Cannot exclude a subvalvular or supravalvular membrane. The aortic  valve is tricuspid. Aortic valve regurgitation is not visualized. Mild  aortic valve stenosis. Aortic  valve area, by VTI measures 2.03 cm. Aortic valve mean gradient measures  10.0 mmHg. Aortic valve Vmax measures 2.23 m/s.   6.  The inferior vena cava is normal in size with greater than 50%  respiratory variability, suggesting right atrial pressure of 3 mmHg.   Patient Profile     77 y.o. 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 Elberta Fortis has seen her again this morning, revisited procedure, she has no further follow up questions, remains agreeable to proceed  Clears for breakfast, NPO at 1000   For questions or updates, please contact [facility] Please consult [email or web address] for contact info under        Signed, Sheilah Pigeon, PA-C  03/19/2023, 8:50 AM    I have seen and examined this patient with [name].  Agree with above, note added to reflect my findings.  Thing while lying in bed.  Remains in 2-1 AV block.  GEN: Well nourished, well developed, in no acute distress  HEENT: normal  Neck: no JVD, carotid bruits, or masses Cardiac: RRR no edema  Respiratory:  clear to auscultation bilaterally, normal work of breathing GI: soft, nontender, nondistended, + BS MS: no deformity or atrophy  Skin: warm and dry Neuro:  Strength and sensation are intact Psych: euthymic mood, full affect   2-1 second-degree AV block: No reversible causes noted.  She is feeling well today.   plan for pacemaker implant.  Risk and benefits have been discussed.  Bleeding, tamponade, infection, pneumothorax, lead dislodgment, MI, stroke, renal failure.  She understands these risks and is agreed to the procedure.   M.  MD 03/19/2023 9:55 AM

## 2023-03-19 NOTE — Progress Notes (Signed)
Pt's BP was 185/70.   IV hydralazine before transferring to the unit didn't bring down pt's BP. Notified Cardiology and got an order of 10 mg Novasc. Pt was complaining of Nausea after drinking water and gave IV zofran. Reported off to night shift.

## 2023-03-19 NOTE — Progress Notes (Addendum)
Rounding Note    Patient Name: Ellen Drake Date of Encounter: 03/19/2023  [facility] Cardiologist: Jodelle Red, MD   Subjective   Feels well, no ongoing nausea, no CP, SOB  Inpatient Medications    Scheduled Meds:  brimonidine  1 drop Both Eyes BID   chlorhexidine  60 mL Topical Once   gentamicin (GARAMYCIN) 80 mg in sodium chloride 0.9 % 500 mL irrigation  80 mg Irrigation To SSTC   latanoprost  1 drop Both Eyes QHS   rosuvastatin  10 mg Oral Daily   sodium chloride flush  3 mL Intravenous Q12H   sulfaSALAzine  1,500 mg Oral BID   timolol  1 drop Both Eyes BID   Continuous Infusions:  sodium chloride 50 mL/hr at 03/19/23 0549   sodium chloride      ceFAZolin (ANCEF) IV     PRN Meds: acetaminophen, hydrALAZINE, ondansetron (ZOFRAN) IV, sodium chloride flush, traMADol   Vital Signs    Vitals:   03/19/23 0014 03/19/23 0414 03/19/23 0738 03/19/23 0755  BP: (!) 143/41 (!) 153/45 (!) 158/52 (!) 152/44  Pulse: (!) 43 (!) 40 (!) 40 (!) 40  Resp: 18 18 17    Temp: 97.8 F (36.6 C) 98.6 F (37 C) 98.4 F (36.9 C)   TempSrc: Oral Oral Oral   SpO2: 93% 95% 94% 94%  Weight:  63.7 kg    Height:        Intake/Output Summary (Last 24 hours) at 03/19/2023 0850 Last data filed at 03/18/2023 2209 Gross per 24 hour  Intake 460 ml  Output --  Net 460 ml      03/19/2023    4:14 AM 03/18/2023    5:27 AM 03/17/2023   12:17 AM  Last 3 Weights  Weight (lbs) 140 lb 8 oz 145 lb 1 oz 140 lb 8 oz  Weight (kg) 63.73 kg 65.8 kg 63.73 kg      Telemetry    Remains in 2:1 AVblock, 40's - Personally Reviewed  ECG    No new EKGs - Personally Reviewed  Physical Exam   GEN: No acute distress.   Neck: No JVD Cardiac: RRR, bradycardic,  no murmurs, rubs, or gallops.  Respiratory: CTA b/l. GI: Soft, nontender, non-distended  MS: No edema; No deformity. Neuro:  Nonfocal  Psych: Normal affect   Labs    High Sensitivity Troponin:  No results for input(s):  "TROPONINIHS" in the last 720 hours.   Chemistry Recent Labs  Lab 03/17/23 0042 03/18/23 1250 03/19/23 0046  NA 139 140 140  K 3.1* 4.0 4.1  CL 105 107 110  CO2 24 27 23   GLUCOSE 104* 109* 99  BUN 22 25* 31*  CREATININE 1.09* 1.11* 1.35*  CALCIUM 9.5 9.4 9.1  GFRNONAA 52* 51* 40*  ANIONGAP 10 6 7     Lipids  Recent Labs  Lab 03/17/23 0042  CHOL 135  TRIG 82  HDL 62  LDLCALC 57  CHOLHDL 2.2    Hematology Recent Labs  Lab 03/17/23 0042 03/18/23 1250 03/19/23 0046  WBC 9.2 8.4 8.5  RBC 3.38* 3.55* 3.18*  HGB 11.1* 11.4* 10.3*  HCT 33.1* 34.5* 31.3*  MCV 97.9 97.2 98.4  MCH 32.8 32.1 32.4  MCHC 33.5 33.0 32.9  RDW 13.8 14.2 14.2  PLT 173 195 165   Thyroid  Recent Labs  Lab 03/16/23 1404  TSH 1.789    BNPNo results for input(s): "BNP", "PROBNP" in the last 168 hours.  DDimer No  results for input(s): "DDIMER" in the last 168 hours.   Radiology      Cardiac Studies   03/17/23: TTE  1. Intracavitary gradient. Peak gradient 1.7 m/s. Peak gradient 5.5 mmHg.  Left ventricular ejection fraction, by estimation, is 60 to 65%. The left  ventricle has normal function. The left ventricle has no regional wall  motion abnormalities. Left  ventricular diastolic parameters are consistent with Grade I diastolic  dysfunction (impaired relaxation).   2. Right ventricular systolic function is normal. The right ventricular  size is normal.   3. Left atrial size was mildly dilated.   4. The mitral valve is normal in structure. Mild mitral valve  regurgitation. No evidence of mitral stenosis.   5. Aortic outflow gradients are mildly elevated. The aortic valve appears  normal. Cannot exclude a subvalvular or supravalvular membrane. The aortic  valve is tricuspid. Aortic valve regurgitation is not visualized. Mild  aortic valve stenosis. Aortic  valve area, by VTI measures 2.03 cm. Aortic valve mean gradient measures  10.0 mmHg. Aortic valve Vmax measures 2.23 m/s.   6.  The inferior vena cava is normal in size with greater than 50%  respiratory variability, suggesting right atrial pressure of 3 mmHg.   Patient Profile     77 y.o. 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 Elberta Fortis has seen her again this morning, revisited procedure, she has no further follow up questions, remains agreeable to proceed  Clears for breakfast, NPO at 1000   For questions or updates, please contact [facility] Please consult [email or web address] for contact info under        Signed, Sheilah Pigeon, PA-C  03/19/2023, 8:50 AM    I have seen and examined this patient with [name].  Agree with above, note added to reflect my findings.  Thing while lying in bed.  Remains in 2-1 AV block.  GEN: Well nourished, well developed, in no acute distress  HEENT: normal  Neck: no JVD, carotid bruits, or masses Cardiac: RRR no edema  Respiratory:  clear to auscultation bilaterally, normal work of breathing GI: soft, nontender, nondistended, + BS MS: no deformity or atrophy  Skin: warm and dry Neuro:  Strength and sensation are intact Psych: euthymic mood, full affect   2-1 second-degree AV block: No reversible causes noted.  She is feeling well today.   plan for pacemaker implant.  Risk and benefits have been discussed.  Bleeding, tamponade, infection, pneumothorax, lead dislodgment, MI, stroke, renal failure.  She understands these risks and is agreed to the procedure.   M.  MD 03/19/2023 9:55 AM

## 2023-03-19 NOTE — Plan of Care (Signed)

## 2023-03-19 NOTE — Progress Notes (Signed)
Rounding Note    Patient Name: Ellen Drake Date of Encounter: 03/19/2023  [facility] Cardiologist: Jodelle Red, MD   Subjective   Planned for pacemaker implantation today. Feeling well this AM, eating clears for breakfast.  Inpatient Medications    Scheduled Meds:  brimonidine  1 drop Both Eyes BID   chlorhexidine  60 mL Topical Once   gentamicin (GARAMYCIN) 80 mg in sodium chloride 0.9 % 500 mL irrigation  80 mg Irrigation To SSTC   latanoprost  1 drop Both Eyes QHS   rosuvastatin  10 mg Oral Daily   sodium chloride flush  3 mL Intravenous Q12H   sulfaSALAzine  1,500 mg Oral BID   timolol  1 drop Both Eyes BID   Continuous Infusions:  sodium chloride 50 mL/hr at 03/19/23 0549   sodium chloride      ceFAZolin (ANCEF) IV     PRN Meds: acetaminophen, hydrALAZINE, ondansetron (ZOFRAN) IV, sodium chloride flush, traMADol   Vital Signs    Vitals:   03/19/23 0014 03/19/23 0414 03/19/23 0738 03/19/23 0755  BP: (!) 143/41 (!) 153/45 (!) 158/52 (!) 152/44  Pulse: (!) 43 (!) 40 (!) 40 (!) 40  Resp: 18 18 17    Temp: 97.8 F (36.6 C) 98.6 F (37 C) 98.4 F (36.9 C)   TempSrc: Oral Oral Oral   SpO2: 93% 95% 94% 94%  Weight:  63.7 kg    Height:        Intake/Output Summary (Last 24 hours) at 03/19/2023 0944 Last data filed at 03/18/2023 2209 Gross per 24 hour  Intake 460 ml  Output --  Net 460 ml      03/19/2023    4:14 AM 03/18/2023    5:27 AM 03/17/2023   12:17 AM  Last 3 Weights  Weight (lbs) 140 lb 8 oz 145 lb 1 oz 140 lb 8 oz  Weight (kg) 63.73 kg 65.8 kg 63.73 kg      Telemetry    2:1 AV block - Personally Reviewed  ECG    No new - Personally Reviewed  Physical Exam   GEN: Well nourished, well developed in no acute distress NECK: No JVD CARDIAC: regular rhythm, normal S1 and S2, no rubs or gallops. No murmur. VASCULAR: Radial pulses 2+ bilaterally.  RESPIRATORY:  Clear to auscultation without rales, wheezing or rhonchi   ABDOMEN: Soft, non-tender, non-distended MUSCULOSKELETAL:  Moves all 4 limbs independently SKIN: Warm and dry, no edema NEUROLOGIC:  No focal neuro deficits noted. PSYCHIATRIC:  Normal affect    Labs    High Sensitivity Troponin:  No results for input(s): "TROPONINIHS" in the last 720 hours.   Chemistry Recent Labs  Lab 03/17/23 0042 03/18/23 1250 03/19/23 0046  NA 139 140 140  K 3.1* 4.0 4.1  CL 105 107 110  CO2 24 27 23   GLUCOSE 104* 109* 99  BUN 22 25* 31*  CREATININE 1.09* 1.11* 1.35*  CALCIUM 9.5 9.4 9.1  GFRNONAA 52* 51* 40*  ANIONGAP 10 6 7     Lipids  Recent Labs  Lab 03/17/23 0042  CHOL 135  TRIG 82  HDL 62  LDLCALC 57  CHOLHDL 2.2    Hematology Recent Labs  Lab 03/17/23 0042 03/18/23 1250 03/19/23 0046  WBC 9.2 8.4 8.5  RBC 3.38* 3.55* 3.18*  HGB 11.1* 11.4* 10.3*  HCT 33.1* 34.5* 31.3*  MCV 97.9 97.2 98.4  MCH 32.8 32.1 32.4  MCHC 33.5 33.0 32.9  RDW 13.8 14.2 14.2  PLT 173 195 165   Thyroid  Recent Labs  Lab 03/16/23 1404  TSH 1.789    BNPNo results for input(s): "BNP", "PROBNP" in the last 168 hours.  DDimer No results for input(s): "DDIMER" in the last 168 hours.   Radiology    ECHOCARDIOGRAM COMPLETE  Result Date: 03/17/2023    ECHOCARDIOGRAM REPORT   Patient Name:   MARIZELA HOLIMAN Date of Exam: 03/17/2023 Medical Rec #:  [id number]         Height:       66.0 in Accession #:    [id number]        Weight:       140.5 lb Date of Birth:  1946/03/16         BSA:          1.721 m Patient Age:    77 years          BP:           156/43 mmHg Patient Gender: F                 HR:           44 bpm. Exam Location:  Inpatient Procedure: 2D Echo, Cardiac Doppler and Color Doppler Indications:    second degree heart block  History:        Patient has no prior history of Echocardiogram examinations.                 Risk Factors:Hypertension and Dyslipidemia.  Sonographer:    Delcie Roch RDCS Referring Phys: [phone]    IMPRESSIONS  1. Intracavitary gradient. Peak gradient 1.7 m/s. Peak gradient 5.5 mmHg. Left ventricular ejection fraction, by estimation, is 60 to 65%. The left ventricle has normal function. The left ventricle has no regional wall motion abnormalities. Left ventricular diastolic parameters are consistent with Grade I diastolic dysfunction (impaired relaxation).  2. Right ventricular systolic function is normal. The right ventricular size is normal.  3. Left atrial size was mildly dilated.  4. The mitral valve is normal in structure. Mild mitral valve regurgitation. No evidence of mitral stenosis.  5. Aortic outflow gradients are mildly elevated. The aortic valve appears normal. Cannot exclude a subvalvular or supravalvular membrane. The aortic valve is tricuspid. Aortic valve regurgitation is not visualized. Mild aortic valve stenosis. Aortic valve area, by VTI measures 2.03 cm. Aortic valve mean gradient measures 10.0 mmHg. Aortic valve Vmax measures 2.23 m/s.  6. The inferior vena cava is normal in size with greater than 50% respiratory variability, suggesting right atrial pressure of 3 mmHg. FINDINGS  Left Ventricle: Intracavitary gradient. Peak gradient 1.7 m/s. Peak gradient 5.5 mmHg. Left ventricular ejection fraction, by estimation, is 60 to 65%. The left ventricle has normal function. The left ventricle has no regional wall motion abnormalities.  The left ventricular internal cavity size was normal in size. There is no left ventricular hypertrophy. Left ventricular diastolic parameters are consistent with Grade I diastolic dysfunction (impaired relaxation). Right Ventricle: The right ventricular size is normal. No increase in right ventricular wall thickness. Right ventricular systolic function is normal. Left Atrium: Left atrial size was mildly dilated. Right Atrium: Right atrial size was normal in size. Pericardium: Trivial pericardial effusion is present. Mitral Valve: The mitral valve is normal in  structure. Mild mitral valve regurgitation. No evidence of mitral valve stenosis. Tricuspid Valve: The tricuspid valve is normal in structure. Tricuspid valve regurgitation is not demonstrated. No evidence of tricuspid stenosis. Aortic Valve: Aortic  outflow gradients are mildly elevated. The aortic valve appears normal. Cannot exclude a subvalvular or supravalvular membrane. The aortic valve is tricuspid. Aortic valve regurgitation is not visualized. Mild aortic stenosis is present. Aortic valve mean gradient measures 10.0 mmHg. Aortic valve peak gradient measures 19.9 mmHg. Aortic valve area, by VTI measures 2.03 cm. Pulmonic Valve: The pulmonic valve was normal in structure. Pulmonic valve regurgitation is not visualized. No evidence of pulmonic stenosis. Aorta: The aortic root is normal in size and structure. Venous: The inferior vena cava is normal in size with greater than 50% respiratory variability, suggesting right atrial pressure of 3 mmHg. IAS/Shunts: No atrial level shunt detected by color flow Doppler.  LEFT VENTRICLE PLAX 2D LVIDd:         4.20 cm   Diastology LVIDs:         2.80 cm   LV e' medial:  13.60 cm/s LV PW:         0.90 cm   LV e' lateral: 16.30 cm/s LV IVS:        0.90 cm LVOT diam:     1.80 cm LV SV:         112 LV SV Index:   65 LVOT Area:     2.54 cm  RIGHT VENTRICLE             IVC RV Basal diam:  2.50 cm     IVC diam: 1.40 cm RV S prime:     13.10 cm/s TAPSE (M-mode): 2.3 cm LEFT ATRIUM             Index        RIGHT ATRIUM          Index LA diam:        3.40 cm 1.98 cm/m   RA Area:     9.88 cm LA Vol (A2C):   67.8 ml 39.39 ml/m  RA Volume:   18.70 ml 10.87 ml/m LA Vol (A4C):   48.1 ml 27.95 ml/m LA Biplane Vol: 57.0 ml 33.12 ml/m  AORTIC VALVE AV Area (Vmax):    2.06 cm AV Area (Vmean):   1.78 cm AV Area (VTI):     2.03 cm AV Vmax:           223.00 cm/s AV Vmean:          147.000 cm/s AV VTI:            0.553 m AV Peak Grad:      19.9 mmHg AV Mean Grad:      10.0 mmHg LVOT  Vmax:         180.50 cm/s LVOT Vmean:        103.000 cm/s LVOT VTI:          0.442 m LVOT/AV VTI ratio: 0.80  AORTA Ao Root diam: 2.60 cm Ao Asc diam:  2.70 cm  SHUNTS Systemic VTI:  0.44 m Systemic Diam: 1.80 cm Chilton Si MD Electronically signed by Chilton Si MD Signature Date/Time: 03/17/2023/5:30:16 PM    Final     Cardiac Studies   Echo reviewed  Patient Profile     77 y.o. female with a hx of hypertension, hyperlipidemia, Crohn's disease, fibrocystic breast disease who is admitted for the evaluation of symptomatic second degree type II AVB  Assessment & Plan    Exertional intolerance/fatigue Second degree AV block type II -hold nodal agents, last dose 03/16/23 AM.  -EP has seen, planned for pacemaker today -echo with EF  60-65%, no severe valve disease -TSH normal   Hypertension -allow for some permissive hypertension given bradycardia -restart home amlodipine post pacemaker  Hyperlipidemia -LDL 57, lpa 70 -continue rosuvastatin    Crohn's disease  -Continue home Sulfasalazine.  For questions or updates, please contact [facility] Please consult [email or web address] for contact info under     Signed, Jodelle Red, MD  03/19/2023, 9:44 AM

## 2023-03-19 NOTE — Interval H&P Note (Signed)
History and Physical Interval Note:  03/19/2023 2:30 PM  Ellen Drake  has presented today for surgery, with the diagnosis of heart block.  The various methods of treatment have been discussed with the patient and family. After consideration of risks, benefits and other options for treatment, the patient has consented to  Procedure(s): PACEMAKER IMPLANT (N/A) as a surgical intervention.  The patient's history has been reviewed, patient examined, no change in status, stable for surgery.  I have reviewed the patient's chart and labs.  Questions were answered to the patient's satisfaction.      [employer]

## 2023-03-20 ENCOUNTER — Inpatient Hospital Stay (HOSPITAL_COMMUNITY): Payer: Medicare HMO

## 2023-03-20 ENCOUNTER — Other Ambulatory Visit (HOSPITAL_COMMUNITY): Payer: Self-pay

## 2023-03-20 ENCOUNTER — Encounter (HOSPITAL_COMMUNITY): Payer: Self-pay | Admitting: Cardiology

## 2023-03-20 DIAGNOSIS — I441 Atrioventricular block, second degree: Secondary | ICD-10-CM | POA: Diagnosis not present

## 2023-03-20 LAB — BASIC METABOLIC PANEL
Anion gap: 11 (ref 5–15)
BUN: 24 mg/dL — ABNORMAL HIGH (ref 8–23)
CO2: 20 mmol/L — ABNORMAL LOW (ref 22–32)
Calcium: 8.9 mg/dL (ref 8.9–10.3)
Chloride: 106 mmol/L (ref 98–111)
Creatinine, Ser: 1 mg/dL (ref 0.44–1.00)
GFR, Estimated: 58 mL/min — ABNORMAL LOW (ref >60)
Glucose, Bld: 65 mg/dL — ABNORMAL LOW (ref 70–99)
Potassium: 3.5 mmol/L (ref 3.5–5.1)
Sodium: 137 mmol/L (ref 135–145)

## 2023-03-20 MED ORDER — CARVEDILOL 12.5 MG PO TABS
12.5000 mg | ORAL_TABLET | Freq: Two times a day (BID) | ORAL | 11 refills | Status: DC
  Filled 2023-03-20: qty 60, 30d supply, fill #0

## 2023-03-20 MED FILL — Cefazolin Sodium-Dextrose IV Solution 2 GM/100ML-4%: INTRAVENOUS | Qty: 100 | Status: AC

## 2023-03-20 MED FILL — Gentamicin Sulfate Inj 40 MG/ML: INTRAMUSCULAR | Qty: 80 | Status: AC

## 2023-03-20 NOTE — Discharge Instructions (Addendum)
After Your Pacemaker   You have a [employer]onic Pacemaker  ACTIVITY Do not lift your arm above shoulder height for 1 week after your procedure. After 7 days, you may progress as below.  You should remove your sling 24 hours after your procedure, unless otherwise instructed by your provider.     Friday March 27, 2023  Saturday March 28, 2023 Sunday March 29, 2023 Monday March 30, 2023   Do not lift, push, pull, or carry anything over 10 pounds with the affected arm until 6 weeks (Friday May 01, 2023 ) after your procedure.   You may drive AFTER your wound check, unless you have been told otherwise by your provider.   Ask your healthcare provider when you can go back to work   INCISION/Dressing  Monitor your Pacemaker site for redness, swelling, and drainage. Call the device clinic at [phone] if you experience these symptoms or fever/chills.  If your incision is sealed with Steri-strips or staples, you may shower 7 days after your procedure or when told by your provider. Do not remove the steri-strips or let the shower hit directly on your site. You may wash around your site with soap and water.    If you were discharged in a sling, please do not wear this during the day more than 48 hours after your surgery unless otherwise instructed. This may increase the risk of stiffness and soreness in your shoulder.   Avoid lotions, ointments, or perfumes over your incision until it is well-healed.  You may use a hot tub or a pool AFTER your wound check appointment if the incision is completely closed.  Pacemaker Alerts:  Some alerts are vibratory and others beep. These are NOT emergencies. Please call our office to let us know. If this occurs at night or on weekends, it can wait until the next business day. Send a remote transmission.  If your device is capable of reading fluid status (for heart failure), you will be offered monthly monitoring to review this with you.   DEVICE  MANAGEMENT Remote monitoring is used to monitor your pacemaker from home. This monitoring is scheduled every 91 days by our office. It allows [place] to keep an eye on the functioning of your device to ensure it is working properly. You will routinely see your Electrophysiologist annually (more often if necessary).   You should receive your ID card for your new device in 4-8 weeks. Keep this card with you at all times once received. Consider wearing a medical alert bracelet or necklace.  Your Pacemaker may be MRI compatible. This will be discussed at your next office visit/wound check.  You should avoid contact with strong electric or magnetic fields.   Do not use amateur (ham) radio equipment or electric (arc) welding torches. MP3 player headphones with magnets should not be used. Some devices are safe to use if held at least 12 inches (30 cm) from your Pacemaker. These include power tools, lawn mowers, and speakers. If you are unsure if something is safe to use, ask your health care provider.  When using your cell phone, hold it to the ear that is on the opposite side from the Pacemaker. Do not leave your cell phone in a pocket over the Pacemaker.  You may safely use electric blankets, heating pads, computers, and microwave ovens.  Call the office right away if: You have chest pain. You feel more short of breath than you have felt before. You feel more light-headed than you have  felt before. Your incision starts to open up.  This information is not intended to replace advice given to you by your health care provider. Make sure you discuss any questions you have with your health care provider.

## 2023-03-20 NOTE — Plan of Care (Signed)
  Problem: Education: Goal: Knowledge of General Education information will improve Description: Including pain rating scale, medication(s)/side effects and non-pharmacologic comfort measures Outcome: Adequate for Discharge   Problem: Health Behavior/Discharge Planning: Goal: Ability to manage health-related needs will improve Outcome: Adequate for Discharge   Problem: Clinical Measurements: Goal: Ability to maintain clinical measurements within normal limits will improve Outcome: Adequate for Discharge Goal: Will remain free from infection Outcome: Adequate for Discharge Goal: Diagnostic test results will improve Outcome: Adequate for Discharge Goal: Respiratory complications will improve Outcome: Adequate for Discharge Goal: Cardiovascular complication will be avoided Outcome: Adequate for Discharge   Problem: Activity: Goal: Risk for activity intolerance will decrease Outcome: Adequate for Discharge   Problem: Nutrition: Goal: Adequate nutrition will be maintained Outcome: Adequate for Discharge   Problem: Coping: Goal: Level of anxiety will decrease Outcome: Adequate for Discharge   Problem: Elimination: Goal: Will not experience complications related to bowel motility Outcome: Adequate for Discharge Goal: Will not experience complications related to urinary retention Outcome: Adequate for Discharge   Problem: Pain Managment: Goal: General experience of comfort will improve Outcome: Adequate for Discharge   Problem: Safety: Goal: Ability to remain free from injury will improve Outcome: Adequate for Discharge   Problem: Skin Integrity: Goal: Risk for impaired skin integrity will decrease Outcome: Adequate for Discharge   Problem: Education: Goal: Knowledge of cardiac device and self-care will improve Outcome: Adequate for Discharge Goal: Ability to safely manage health related needs after discharge will improve Outcome: Adequate for Discharge Goal:  Individualized Educational Video(s) Outcome: Adequate for Discharge   Problem: Cardiac: Goal: Ability to achieve and maintain adequate cardiopulmonary perfusion will improve Outcome: Adequate for Discharge   

## 2023-03-20 NOTE — Consult Note (Signed)
   [facility] CM Inpatient Consult   03/20/2023  [name] [birth year] [id number]  [facility] [[facility]]  Accountable Care Organization [ACO] Patient: Ellen Drake for  insurance  Primary Care Provider:  Dale [place], MD  with [facility] is listed to provide the transition of care follow up calls and appointments  Patient screened for 3 day hospitalization with noted low risk score for unplanned readmission risk and to assess for potential [facility]  [[facility]] Care Management service needs for post hospital transition for care coordination.  Review of patient's electronic medical record reveals patient is from home with family and received a Pacemaker on 03/19/23 for diagnosis of Heart Block.  1030 am: Met with patient and husband at the bedside regarding post hospital follow up needs and to alert them of potential follow up calls from PCP [facility] team likely Monday. Reviewed for other care coordination needs and patient and husband states, "nothing they can think of right now." Encouraged them to contact PCP for any concerns for follow up.   Plan:   Referral request for community care coordination:anticipate the community [facility] for follow up for community review.  Of note, [facility] Care Management/Population Health does not replace or interfere with any arrangements made by the Inpatient Transition of Care team.  For questions contact:   Charlesetta Shanks, RN BSN CCM [facility]  [phone] business mobile phone Toll free office [phone]  *Concierge Line  [phone] Fax number: [phone] [email or web address]

## 2023-03-20 NOTE — Progress Notes (Signed)
Pt discharge info given with Husband at the bedside. IV Tele and [place]  removed, assisted pt with Dressing d/t PM restrictions.

## 2023-03-20 NOTE — TOC Transition Note (Signed)
Transition of Care [facility]) - CM/SW Discharge Note   Patient Details  Name: Ellen Drake MRN: [id number] Date of Birth: 11/07/1945  [facility]) CM/SW Contact:  Leone Haven, RN Phone Number: 03/20/2023, 10:18 AM   Clinical Narrative:    Patient is for dc today, TOC to fill meds, spouse to transport her home.      Barriers to Discharge: Continued Medical Work up   Patient Goals and CMS Choice   Choice offered to / list presented to : NA  Discharge Placement                         Discharge Plan and Services Additional resources added to the After Visit Summary for   In-house Referral: NA Discharge Planning Services: CM Consult Post Acute Care Choice: NA          DME Arranged: N/A DME Agency: NA       HH Arranged: NA          Social Determinants of Health (SDOH) Interventions SDOH Screenings   Food Insecurity: No Food Insecurity (03/17/2023)  Housing: Low Risk  (03/17/2023)  Transportation Needs: No Transportation Needs (03/17/2023)  Utilities: Not At Risk (03/17/2023)  Depression (PHQ2-9): Low Risk  (11/03/2022)  Financial Resource Strain: Low Risk  (07/18/2022)  Physical Activity: Unknown (07/17/2021)  Social Connections: Unknown (07/18/2022)  Stress: No Stress Concern Present (07/18/2022)  Tobacco Use: Medium Risk (03/16/2023)     Readmission Risk Interventions     No data to display

## 2023-03-20 NOTE — Discharge Summary (Cosign Needed)
DISCHARGE SUMMARY    Patient ID: Ellen Drake,  MRN: [id number], DOB/AGE: 1946/06/07 77 y.o.  Admit date: 03/16/2023 Discharge date: 03/20/2023  Primary Care Physician: Dale Hen[place], MD  Primary Cardiologist: new, Dr. Cristal Deer Electrophysiologist: new, Dr. Elberta Fortis  Primary Discharge Diagnosis:  Symptomatic bradycardia  2.   Advanced heart block       2:1 AVblock status post pacemaker implantation this admission  Secondary Discharge Diagnosis:  HTN Chron's disease HLD  Allergies  Allergen Reactions   Mercaptopurine Nausea Only and Other (See Comments)   Lyrica [Pregabalin]     Hand swelling   Dilaudid [Hydromorphone Hcl] Rash     Procedures This Admission:  1.  Implantation of a MDT  dual chamber PPM on 03/19/23 by Dr Elberta Fortis.   There were no immediate post procedure complications. CXR on 03/20/23 demonstrated no pneumothorax status post device implantation.   Brief HPI: Ellen Drake is a 77 y.o. female sought medical attention with progressive and unusual fatigue, DOE, found to be bradycardic with 2:1 AV block, admitted for further evaluation her home atenolol held.  Hospital Course:  The patient was admitted, BP stable, HR in the 40's, home BB held, labs with mild hypokalemia, her timolol not felt to be cause advanced heart block and continued.  Despite washout of her BB,  2:1 block persisted, LVEF 60-65%, and underwent implantation of a PPM with details as outlined in the procedure report.  She was monitored on telemetry overnight which demonstrated SR/ V pacing.  Left chest was without hematoma or ecchymosis.  The device was interrogated and found to be functioning normally.  CXR was obtained and demonstrated no pneumothorax status post device implantation.  Wound care, arm mobility, and restrictions were reviewed with the patient.  The patient feels well, denies any CP/SOB, with  minimal site discomfort.  She was examined by Dr. Elberta Fortis and considered  stable for discharge to home.   AKI with f/u labs this morning with Creat back down  Will change her home atenolol > Coreg 12.5mg  BID  Physical Exam: Vitals:   03/19/23 1938 03/20/23 0023 03/20/23 0504 03/20/23 0732  BP: (!) 182/87 (!) 142/60 (!) 150/68 (!) 153/70  Pulse:  89 85 92  Resp: 20 17 19 17   Temp: 97.9 F (36.6 C) 98.3 F (36.8 C) 97.6 F (36.4 C) 97.6 F (36.4 C)  TempSrc: Oral Oral Oral Oral  SpO2: 95% 93% 92% 93%  Weight:   64.5 kg   Height:        GEN- The patient is well appearing, alert and oriented x 3 today.   HEENT: normocephalic, atraumatic; sclera clear, conjunctiva pink; hearing intact; oropharynx clear; neck supple, no JVP Lungs-  CTA b/l, normal work of breathing.  No wheezes, rales, rhonchi Heart- RRR, no murmurs, rubs or gallops, PMI not laterally displaced GI- soft, non-tender, non-distended Extremities- no clubbing, cyanosis, or edema MS- no significant deformity or atrophy Skin- warm and dry, no rash or lesion, left chest without hematoma/ecchymosis Psych- euthymic mood, full affect Neuro- no gross deficits   Labs:   Lab Results  Component Value Date   WBC 8.5 03/19/2023   HGB 10.3 (L) 03/19/2023   HCT 31.3 (L) 03/19/2023   MCV 98.4 03/19/2023   PLT 165 03/19/2023    Recent Labs  Lab 03/20/23 0832  NA 137  K 3.5  CL 106  CO2 20*  BUN 24*  CREATININE 1.00  CALCIUM 8.9  GLUCOSE 65*  Discharge Medications:  Allergies as of 03/20/2023       Reactions   Mercaptopurine Nausea Only, Other (See Comments)   Lyrica [pregabalin]    Hand swelling   Dilaudid [hydromorphone Hcl] Rash        Medication List     STOP taking these medications    atenolol 50 MG tablet Commonly known as: TENORMIN       TAKE these medications    amLODipine 10 MG tablet Commonly known as: NORVASC TAKE 1 TABLET EVERY DAY   ASPIRIN 81 PO Take 1 tablet by mouth daily.   carvedilol 12.5 MG tablet Commonly known as: Coreg Take 1 tablet  (12.5 mg total) by mouth 2 (two) times daily.   hydrochlorothiazide 12.5 MG capsule Commonly known as: MICROZIDE TAKE 1 CAPSULE BY MOUTH EVERY DAY   latanoprost 0.005 % ophthalmic solution Commonly known as: XALATAN Place 1 drop into both eyes at bedtime.   magnesium oxide 400 (240 Mg) MG tablet Commonly known as: MAG-OX TAKE 1 TABLET BY MOUTH EVERY DAY   potassium chloride 10 MEQ tablet Commonly known as: Klor-Con M10 Take 1 tablet (10 mEq total) by mouth daily. What changed: how much to take   rosuvastatin 10 MG tablet Commonly known as: CRESTOR TAKE 1 TABLET EVERY DAY   sulfaSALAzine 500 MG tablet Commonly known as: AZULFIDINE Take 1,500 mg by mouth 2 (two) times daily. 3 tabs   timolol 0.5 % ophthalmic solution Commonly known as: TIMOPTIC Place 1 drop into both eyes 2 (two) times daily.   traMADol 50 MG tablet Commonly known as: ULTRAM Take 1 tablet (50 mg total) by mouth 2 (two) times daily. Each refill must last 30 days.   triamcinolone cream 0.1 % Commonly known as: KENALOG Apply 1 Application topically as needed (prn). As needed        Disposition: Home Discharge Instructions     Diet - low sodium heart healthy   Complete by: As directed    Increase activity slowly   Complete by: As directed         Duration of Discharge Encounter: Greater than 30 minutes including physician time.  Norma Fredrickson, PA-C 03/20/2023 9:55 AM   I have seen and examined this patient with [name].  Agree with above, note added to reflect my findings.  On exam, RRR, pacemaker site well healing.  She is now status post pacemaker for second degree AV block.  Device functioning appropriately.  Chest x-ray and interrogation without issue.  Plan for discharge today with follow-up in device clinic.  Will M. Camnitz MD 03/26/2023 12:20 PM

## 2023-03-20 NOTE — Care Management Important Message (Signed)
Important Message  Patient Details  Name: Ellen Drake MRN: [id number] Date of Birth: 1946/03/09   Medicare Important Message Given:  Yes     Renie Ora 03/20/2023, 8:13 AM

## 2023-03-23 ENCOUNTER — Telehealth: Payer: Self-pay | Admitting: *Deleted

## 2023-03-23 NOTE — Transitions of Care (Post Inpatient/ED Visit) (Signed)
   03/23/2023  Name: Ellen Drake MRN: [id number] DOB: 10/12/1945  Today's TOC FU Call Status: Today's TOC FU Call Status:: Unsuccessful Call (1st Attempt) Unsuccessful Call (1st Attempt) Date: 03/23/23  Attempted to reach the patient regarding the most recent Inpatient/ED visit.  Follow Up Plan: Additional outreach attempts will be made to reach the patient to complete the Transitions of Care (Post Inpatient/ED visit) call.   Gean Maidens BSN RN [facility] [phone]

## 2023-03-23 NOTE — Transitions of Care (Post Inpatient/ED Visit) (Signed)
03/23/2023  Name: Ellen Drake MRN: [id number] DOB: 1945-09-19  Today's TOC FU Call Status: Today's TOC FU Call Status:: Successful TOC FU Call Completed TOC FU Call Complete Date: 03/23/23  Transition Care Management Follow-up Telephone Call Date of Discharge: 03/20/23 Discharge Facility: [facility]) Type of Discharge: Inpatient Admission Primary Inpatient Discharge Diagnosis:: Second degree AV block, Mobitz type II How have you been since you were released from the hospital?: Better (I am a little sore, but I can even tell I am breathing better) Any questions or concerns?: No  Items Reviewed: Did you receive and understand the discharge instructions provided?: Yes Medications obtained,verified, and reconciled?: Yes (Medications Reviewed) Any new allergies since your discharge?: Yes Dietary orders reviewed?: No Do you have support at home?: Yes People in Home: spouse Name of Support/Comfort Primary Source: Riley Lam  Medications Reviewed Today: Medications Reviewed Today     Reviewed by Luella Cook, RN (Case Manager) on 03/23/23 at 1123  Med List Status: <None>   Medication Order Taking? Sig Documenting Provider Last Dose Status Informant  amLODipine (NORVASC) 10 MG tablet [id number] Yes TAKE 1 TABLET EVERY DAY Dale [place], MD Taking Active Self, Pharmacy Records  ASPIRIN 81 PO [id number] Yes Take 1 tablet by mouth daily. [provider] Taking Active Self, Pharmacy Records  carvedilol (COREG) 12.5 MG tablet [id number] Yes Take 1 tablet (12.5 mg total) by mouth 2 (two) times daily. Sheilah Pigeon, PA-C Taking Active   hydrochlorothiazide (MICROZIDE) 12.5 MG capsule [id number] Yes TAKE 1 CAPSULE BY MOUTH EVERY DAY Dale [place], MD Taking Active Self, Pharmacy Records  latanoprost (XALATAN) 0.005 % ophthalmic solution [id number] Yes Place 1 drop into both eyes at bedtime.  [provider] Taking Active Self, Pharmacy Records  magnesium oxide  (MAG-OX) 400 (240 Mg) MG tablet [id number] Yes TAKE 1 TABLET BY MOUTH EVERY DAY Dale Bruce, MD Taking Active Self, Pharmacy Records  potassium chloride (KLOR-CON M10) 10 MEQ tablet [id number] Yes Take 1 tablet (10 mEq total) by mouth daily.  Patient taking differently: Take 20 mEq by mouth daily.   Dale [place], MD Taking Active Self, Pharmacy Records  rosuvastatin (CRESTOR) 10 MG tablet [id number] Yes TAKE 1 TABLET EVERY DAY Dale  Plain, MD Taking Active Self, Pharmacy Records  sulfaSALAzine (AZULFIDINE) 500 MG tablet [id number] Yes Take 1,500 mg by mouth 2 (two) times daily. 3 tabs [provider] Taking Active Self, Pharmacy Records  timolol (TIMOPTIC) 0.5 % ophthalmic solution [id number] Yes Place 1 drop into both eyes 2 (two) times daily. [provider] Taking Active Self, Pharmacy Records  traMADol (ULTRAM) 50 MG tablet [id number] Yes Take 1 tablet (50 mg total) by mouth 2 (two) times daily. Each refill must last 30 days. Delano Metz, MD Taking Active Self, Pharmacy Records  triamcinolone cream (KENALOG) 0.1 % [id number] Yes Apply 1 Application topically as needed (prn). As needed [provider] Taking Active Self, Pharmacy Records  Med List Note Concepcion Elk, RN 11/03/22 1016): MR 05-05-23 uds 05-13-2022            Home Care and Equipment/Supplies: Were Home Health Services Ordered?: NA Any new equipment or medical supplies ordered?: NA  Functional Questionnaire: Do you need assistance with bathing/showering or dressing?: Yes Do you need assistance with meal preparation?: Yes Do you need assistance with eating?: No Do you have difficulty maintaining continence: No Do you need assistance with getting out of bed/getting out of a chair/moving?: No (I am using my walker so I don't  fall) Do you have difficulty managing or taking your medications?: No  Follow up appointments reviewed: PCP Follow-up appointment confirmed?: NA Specialist  Hospital Follow-up appointment confirmed?: Yes Date of Specialist follow-up appointment?: 04/02/23 Follow-Up Specialty Provider:: Cardiology for Pacemaker wound check Do you need transportation to your follow-up appointment?: No Do you understand care options if your condition(s) worsen?: Yes-patient verbalized understanding  SDOH Interventions Today    Flowsheet Row Most Recent Value  SDOH Interventions   Food Insecurity Interventions Intervention Not Indicated  Housing Interventions Intervention Not Indicated  Transportation Interventions Intervention Not Indicated, Patient Resources (Friends/Family)      TOC Interventions Today    Flowsheet Row Most Recent Value  TOC Interventions   TOC Interventions Discussed/Reviewed TOC Interventions Discussed, TOC Interventions Reviewed      Interventions Today    Flowsheet Row Most Recent Value  General Interventions   General Interventions Discussed/Reviewed General Interventions Discussed, General Interventions Reviewed, Doctor Visits, Referral to Nurse  [Referred to [name] for Care Coordination]  Doctor Visits Discussed/Reviewed Doctor Visits Discussed  Pharmacy Interventions   Pharmacy Dicussed/Reviewed Pharmacy Topics Discussed      Gean Maidens BSN RN [facility] [phone]

## 2023-03-24 ENCOUNTER — Telehealth: Payer: Self-pay | Admitting: Internal Medicine

## 2023-03-24 ENCOUNTER — Other Ambulatory Visit: Payer: Self-pay | Admitting: Internal Medicine

## 2023-03-25 ENCOUNTER — Other Ambulatory Visit: Payer: Self-pay

## 2023-03-25 MED ORDER — POTASSIUM CHLORIDE CRYS ER 10 MEQ PO TBCR: 20.0000 meq | EXTENDED_RELEASE_TABLET | Freq: Two times a day (BID) | ORAL | 2 refills | Status: DC

## 2023-03-25 NOTE — Telephone Encounter (Signed)
New rx sent in with updated directions.

## 2023-03-25 NOTE — Telephone Encounter (Signed)
Med refilled. Patient had bone scan done by another provider. Will need to obtain result from them

## 2023-03-25 NOTE — Telephone Encounter (Signed)
Prescription Request  03/25/2023  LOV: 03/16/2023  What is the name of the medication or equipment? potassium chloride (KLOR-CON M10) 10 MEQ tablet  Have you contacted your pharmacy to request a refill? Yes   Which pharmacy would you like this sent to?   [id number] - [place], [place] - [address] [place] [address] Phone: [phone] Fax: [phone]   Patient notified that their request is being sent to the clinical staff for review and that they should receive a response within 2 business days.   Please advise at [facility] [phone]   Pt also stated that provider has changed to 2 pills a day, so that's why she's running out so quick.  Pt also asked for her bone density results.

## 2023-03-25 NOTE — Telephone Encounter (Signed)
Patient is aware and is going to call [facility]

## 2023-03-26 ENCOUNTER — Ambulatory Visit: Payer: Self-pay

## 2023-03-26 ENCOUNTER — Ambulatory Visit: Payer: Medicare HMO

## 2023-03-27 NOTE — Patient Outreach (Unsigned)
  Care Coordination   Follow Up Visit Note   04/01/2023 Name: Mary-Jane Day Groenewold MRN: [id number] DOB: 05-26-1946  [name] Cumbo is a 77 y.o. year old female who sees Dale Kirkwood, MD for primary care. I spoke with  [name] [name] by phone today.  What matters to the patients health and wellness today?  Patient states she is doing well.  She denies any pain at pacemaker insertion site.  She reports scant amount of drainage on site dressing.  She states she is scheduled for procedure follow up appointment on 04/02/23.  Patient states she is not able to move left arm as of today. She states she is able to move her left arm at different levels over the next several days.       Goals Addressed             This Visit's Progress    Continued improvement post surgical procedure.       Interventions Today    Flowsheet Row Most Recent Value  Chronic Disease   Chronic disease during today's visit Other  [pacemaker implantation]  General Interventions   General Interventions Discussed/Reviewed General Interventions Reviewed, Doctor Visits  [evaluation of current treatment plan regarding status post pacemaker implant and patients adherence  to plan as established by provider.  Assessed for insertion site pain and/ or signs and symptoms infection]  Doctor Visits Discussed/Reviewed Doctor Visits Reviewed  Maeola Sarah provider visit.  Advised to keep follow up appointments with providers]  Exercise Interventions   Exercise Discussed/Reviewed Physical Activity  [Assessed patients current recommended activity status post pacemaker implantation]  Education Interventions   Education Provided Provided Education  [Reviewed signs/ symptoms of infection.  Advised to monitor site for these symptoms and notify provider if noticed.]  Pharmacy Interventions   Pharmacy Dicussed/Reviewed Pharmacy Topics Reviewed  [medications reviewed. Advised to take medication as prescribed.]           Patient  Stated          SDOH assessments and interventions completed:  Yes  SDOH Interventions Today    Flowsheet Row Most Recent Value  SDOH Interventions   Food Insecurity Interventions Intervention Not Indicated  Housing Interventions Intervention Not Indicated  Transportation Interventions Intervention Not Indicated        Care Coordination Interventions:  No, not indicated   Follow up plan: Follow up call scheduled for 04/16/23    Encounter Outcome:  Pt. Visit Completed   [name] RN,BSN,CCM [facility] Care Coordination [phone] direct line l

## 2023-04-01 NOTE — Patient Instructions (Signed)
   After Your Pacemaker   Monitor your pacemaker site for redness, swelling, and drainage. Call the device clinic at [phone] if you experience these symptoms or fever/chills.  Your incision was closed with Steri-strips or staples:  You may shower 7 days after your procedure and wash your incision with soap and water. Avoid lotions, ointments, or perfumes over your incision until it is well-healed.  You may use a hot tub or a pool after your wound check appointment if the incision is completely closed.  Do not lift, push or pull greater than 10 pounds with the affected arm until 6 weeks after your procedure. UNTIL AFTER SEPTEMBER 19TH.  There are no other restrictions in arm movement after your wound check appointment.  You may drive, unless driving has been restricted by your healthcare providers.  Remote monitoring is used to monitor your pacemaker from home. This monitoring is scheduled every 91 days by our office. It allows [place] to keep an eye on the functioning of your device to ensure it is working properly. You will routinely see your Electrophysiologist annually (more often if necessary).

## 2023-04-01 NOTE — Patient Instructions (Signed)
Visit Information  Thank you for taking time to visit with me today. Please don't hesitate to contact me if I can be of assistance to you.   Following are the goals we discussed today:  Monitor pacemaker site for signs/ symptoms of infection and reports to provider if noticed.  Keep follow up appointment with provider Adhere to recommended activity level per provider instruction   Our next appointment is by telephone on 04/16/23 at 11 am  Please call the care guide team at [phone] if you need to cancel or reschedule your appointment.   If you are experiencing a Mental Health or Behavioral Health Crisis or need someone to talk to, please call the Suicide and Crisis Lifeline: 988 call 1-800-273-TALK (toll free, 24 hour hotline)  The patient verbalized understanding of instructions, educational materials, and care plan provided today and agreed to receive a mailed copy of patient instructions, educational materials, and care plan.   [name] RN,BSN,CCM [facility] Care Coordination [phone] direct line

## 2023-04-02 ENCOUNTER — Ambulatory Visit: Payer: Medicare HMO | Attending: Cardiovascular Disease

## 2023-04-02 DIAGNOSIS — I441 Atrioventricular block, second degree: Secondary | ICD-10-CM

## 2023-04-02 LAB — CUP PACEART INCLINIC DEVICE CHECK
Date Time Interrogation Session: 20240822183357
Implantable Lead Connection Status: 753985
Implantable Lead Connection Status: 753985
Implantable Lead Implant Date: 20240808
Implantable Lead Implant Date: 20240808
Implantable Lead Location: 753859
Implantable Lead Location: 753860
Implantable Lead Model: 3830
Implantable Lead Model: 5076
Implantable Pulse Generator Implant Date: 20240808

## 2023-04-02 NOTE — Progress Notes (Signed)

## 2023-04-06 ENCOUNTER — Ambulatory Visit (INDEPENDENT_AMBULATORY_CARE_PROVIDER_SITE_OTHER): Payer: Medicare HMO

## 2023-04-06 DIAGNOSIS — E538 Deficiency of other specified B group vitamins: Secondary | ICD-10-CM

## 2023-04-06 MED ORDER — CYANOCOBALAMIN 1000 MCG/ML IJ SOLN
1000.0000 ug | Freq: Once | INTRAMUSCULAR | Status: AC
Start: 2023-04-06 — End: 2023-04-06
  Administered 2023-04-06: 1000 ug via INTRAMUSCULAR

## 2023-04-06 NOTE — Progress Notes (Signed)
Pt presented for their vitamin B12 injection. Pt was identified through two identifiers. Pt tolerated shot well in their right deltoid.  

## 2023-04-08 DIAGNOSIS — T8484XA Pain due to internal orthopedic prosthetic devices, implants and grafts, initial encounter: Secondary | ICD-10-CM | POA: Diagnosis not present

## 2023-04-08 DIAGNOSIS — Z96651 Presence of right artificial knee joint: Secondary | ICD-10-CM | POA: Diagnosis not present

## 2023-04-16 ENCOUNTER — Ambulatory Visit: Payer: Self-pay

## 2023-04-16 ENCOUNTER — Other Ambulatory Visit (HOSPITAL_COMMUNITY): Payer: Self-pay

## 2023-04-16 ENCOUNTER — Telehealth: Payer: Self-pay

## 2023-04-16 NOTE — Telephone Encounter (Signed)
Spoke with Pt.  She states the last 2 mornings she has woken up with neck and rib pain.  She states when she places ice on it it resolves.  No current discomfort.  Per Pt she has been trying not to sleep on her left side because of her pacemaker.    Advised Pt ok to sleep on her left side at this point.   Per Pt's description of pain, sounds musculoskeletal in nature due to sleeping position relieved by ice.  She states pacemaker site is WNL.  States it "looks good" and not having any discomfort.  She will continue to monitor and call device clinic back if any further discomfort.

## 2023-04-16 NOTE — Telephone Encounter (Signed)
Pt states she been waking up with neck pains and under her shoulder pain. I had her send in a transmission for the nurse to review. She put ice on it.

## 2023-04-16 NOTE — Patient Instructions (Signed)
Visit Information  Thank you for taking time to visit with me today. Please don't hesitate to contact me if I can be of assistance to you.   Following are the goals we discussed today:   Goals Addressed             This Visit's Progress    Continued improvement post surgical procedure.       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  Assessed for signs/ symptoms of infection, new/ ongoing symptoms.]  Doctor Visits Discussed/Reviewed Doctor Visits Reviewed  [reviewed upcoming provider visits.  Advised to keep follow up appointments with providers as recommended.]  Exercise Interventions   Exercise Discussed/Reviewed Physical Activity  [assesed patients current recommended activiy level.]  Education Interventions   Education Provided Provided Education  [patient advised to call surgeon office to report new onset of 3 day symptoms of left shoulder/neck pain. Reviewed signs / symptoms of infection.]  Pharmacy Interventions   Pharmacy Dicussed/Reviewed Pharmacy Topics Reviewed  [medications reviewed with patient.  Discussed importance of medication compliance.  Advised patient to contact pharmacy that filled prescription for carvediolol and have them transfer prescription to her local pharmacy.]              Our next appointment is by telephone on 05/14/23 at 10:30 am  Please call the care guide team at [phone] if you need to cancel or reschedule your appointment.   If you are experiencing a Mental Health or Behavioral Health Crisis or need someone to talk to, please call the Suicide and Crisis Lifeline: 988 call 1-800-273-TALK  (toll free, 24 hour hotline)  The patient verbalized understanding of instructions, educational materials, and care plan provided today and agreed to receive a mailed copy of patient instructions, educational materials, and care plan.   [name] RN,BSN,CCM [facility] Care Coordination [phone] direct line

## 2023-04-16 NOTE — Patient Outreach (Signed)
  Care Coordination   Follow Up Visit Note   04/16/2023 Name: Ellen Drake MRN: [id number] DOB: 09/28/1945  Ellen Drake is a 77 y.o. year old female who sees Dale [place], MD for primary care. I spoke with  Ellen Hong Day [name] by phone today.  What matters to the patients health and wellness today?  Patient denies any drainage or swelling from pacemaker site. She states bandage has been removed and she is now able to shower.  Patient states she's been having left shoulder pain that radiates to her neck. She states she notices this more in the morning after waking up and contributed it to sleeping awkward.  She states she is adhering to the activity/ lifting restrictions. Denies any additional symptoms accompanying the shoulder / neck pain.  Patient states her prescription for carvedilol was filled in another city and she wants to know if she can have it filled in the area she lives.    Goals Addressed             This Visit's Progress    Continued improvement post surgical procedure.       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  Assessed for signs/ symptoms of infection, new/ ongoing symptoms.]  Doctor Visits Discussed/Reviewed Doctor Visits Reviewed  [reviewed upcoming provider visits.  Advised to keep follow up appointments with providers as recommended.]  Exercise Interventions   Exercise Discussed/Reviewed Physical Activity  [assesed patients current recommended activiy level.]  Education Interventions   Education Provided Provided Education  [patient advised to call surgeon office to report new onset of 3 day symptoms of  left shoulder/neck pain. Reviewed signs / symptoms of infection.]  Pharmacy Interventions   Pharmacy Dicussed/Reviewed Pharmacy Topics Reviewed  [medications reviewed with patient.  Discussed importance of medication compliance.  Advised patient to contact pharmacy that filled prescription for carvediolol and have them transfer prescription to her local pharmacy.]              SDOH assessments and interventions completed:  No     Care Coordination Interventions:  Yes, provided   Follow up plan: Follow up call scheduled for 05/14/23    Encounter Outcome:  Patient Visit Completed   [name] RN,BSN,CCM [facility] Care Coordination [phone] direct line

## 2023-04-27 ENCOUNTER — Encounter: Payer: Medicare HMO | Admitting: Pain Medicine

## 2023-04-28 ENCOUNTER — Telehealth: Payer: Self-pay

## 2023-04-28 NOTE — Telephone Encounter (Signed)
Patient reports of pain on the top of her shoulder (sore when she presses on muscle). Denies any swelling in left arm, hand or pacemaker. Patient states she thinks she slept wrong as she also had a crick in her neck. Advised patient to call her PCP to follow up. Patient voiced understanding.

## 2023-04-28 NOTE — Telephone Encounter (Signed)
The patient states her left shoulder is swollen a little bit and it hurts. I let her speak with Leigh, rn.

## 2023-04-29 ENCOUNTER — Encounter: Payer: Medicare HMO | Admitting: Pain Medicine

## 2023-05-03 ENCOUNTER — Other Ambulatory Visit: Payer: Self-pay | Admitting: Internal Medicine

## 2023-05-04 NOTE — Telephone Encounter (Signed)
Please clarify what dose of potassium she is currently taking.  Ok to refill, just want to clarify dose.

## 2023-05-05 NOTE — Telephone Encounter (Signed)
Pt taking Potassium 10 mEq 2 tabs BID.  Refill sent.

## 2023-05-07 ENCOUNTER — Ambulatory Visit: Payer: Medicare HMO

## 2023-05-08 ENCOUNTER — Ambulatory Visit: Payer: Medicare HMO

## 2023-05-13 ENCOUNTER — Encounter: Payer: Self-pay | Admitting: Internal Medicine

## 2023-05-13 ENCOUNTER — Ambulatory Visit: Payer: Medicare HMO | Admitting: Internal Medicine

## 2023-05-13 VITALS — BP 138/78 | HR 66 | Temp 97.9°F | Resp 17 | Ht 66.0 in | Wt 143.2 lb

## 2023-05-13 DIAGNOSIS — K5010 Crohn's disease of large intestine without complications: Secondary | ICD-10-CM

## 2023-05-13 DIAGNOSIS — I10 Essential (primary) hypertension: Secondary | ICD-10-CM

## 2023-05-13 DIAGNOSIS — M25511 Pain in right shoulder: Secondary | ICD-10-CM | POA: Diagnosis not present

## 2023-05-13 DIAGNOSIS — D649 Anemia, unspecified: Secondary | ICD-10-CM

## 2023-05-13 DIAGNOSIS — M1711 Unilateral primary osteoarthritis, right knee: Secondary | ICD-10-CM

## 2023-05-13 DIAGNOSIS — M25512 Pain in left shoulder: Secondary | ICD-10-CM

## 2023-05-13 DIAGNOSIS — I441 Atrioventricular block, second degree: Secondary | ICD-10-CM

## 2023-05-13 DIAGNOSIS — E7800 Pure hypercholesterolemia, unspecified: Secondary | ICD-10-CM

## 2023-05-13 DIAGNOSIS — E538 Deficiency of other specified B group vitamins: Secondary | ICD-10-CM | POA: Diagnosis not present

## 2023-05-13 DIAGNOSIS — R001 Bradycardia, unspecified: Secondary | ICD-10-CM | POA: Diagnosis not present

## 2023-05-13 LAB — HEPATIC FUNCTION PANEL
ALT: 8 U/L (ref 0–35)
AST: 19 U/L (ref 0–37)
Albumin: 4.1 g/dL (ref 3.5–5.2)
Alkaline Phosphatase: 67 U/L (ref 39–117)
Bilirubin, Direct: 0.1 mg/dL (ref 0.0–0.3)
Total Bilirubin: 0.3 mg/dL (ref 0.2–1.2)
Total Protein: 6.8 g/dL (ref 6.0–8.3)

## 2023-05-13 LAB — CBC WITH DIFFERENTIAL/PLATELET
Basophils Absolute: 0 10*3/uL (ref 0.0–0.1)
Basophils Relative: 0.4 % (ref 0.0–3.0)
Eosinophils Absolute: 0.1 10*3/uL (ref 0.0–0.7)
Eosinophils Relative: 1.2 % (ref 0.0–5.0)
HCT: 34.2 % — ABNORMAL LOW (ref 36.0–46.0)
Hemoglobin: 11.2 g/dL — ABNORMAL LOW (ref 12.0–15.0)
Lymphocytes Relative: 19 % (ref 12.0–46.0)
Lymphs Abs: 1.5 10*3/uL (ref 0.7–4.0)
MCHC: 32.6 g/dL (ref 30.0–36.0)
MCV: 98.6 fL (ref 78.0–100.0)
Monocytes Absolute: 0.9 10*3/uL (ref 0.1–1.0)
Monocytes Relative: 10.8 % (ref 3.0–12.0)
Neutro Abs: 5.5 10*3/uL (ref 1.4–7.7)
Neutrophils Relative %: 68.6 % (ref 43.0–77.0)
Platelets: 187 10*3/uL (ref 150.0–400.0)
RBC: 3.47 Mil/uL — ABNORMAL LOW (ref 3.87–5.11)
RDW: 13.6 % (ref 11.5–15.5)
WBC: 8.1 10*3/uL (ref 4.0–10.5)

## 2023-05-13 LAB — BASIC METABOLIC PANEL
BUN: 16 mg/dL (ref 6–23)
CO2: 30 meq/L (ref 19–32)
Calcium: 9.9 mg/dL (ref 8.4–10.5)
Chloride: 106 meq/L (ref 96–112)
Creatinine, Ser: 0.89 mg/dL (ref 0.40–1.20)
GFR: 62.51 mL/min (ref >60.00)
Glucose, Bld: 90 mg/dL (ref 70–99)
Potassium: 3.9 meq/L (ref 3.5–5.1)
Sodium: 142 meq/L (ref 135–145)

## 2023-05-13 LAB — TSH: TSH: 2.22 u[IU]/mL (ref 0.35–5.50)

## 2023-05-13 LAB — IBC + FERRITIN
Ferritin: 63.8 ng/mL (ref 10.0–291.0)
Iron: 74 ug/dL (ref 42–145)
Saturation Ratios: 26.6 % (ref 20.0–50.0)
TIBC: 278.6 ug/dL (ref 250.0–450.0)
Transferrin: 199 mg/dL — ABNORMAL LOW (ref 212.0–360.0)

## 2023-05-13 LAB — VITAMIN B12: Vitamin B-12: >1501 pg/mL — ABNORMAL HIGH (ref 211–911)

## 2023-05-13 MED ORDER — MAGNESIUM OXIDE -MG SUPPLEMENT 400 (240 MG) MG PO TABS: 1.0000 | ORAL_TABLET | Freq: Every day | ORAL | 1 refills | Status: DC

## 2023-05-13 MED ORDER — CYANOCOBALAMIN 1000 MCG/ML IJ SOLN
1000.0000 ug | Freq: Once | INTRAMUSCULAR | Status: AC
Start: 2023-05-13 — End: 2023-05-13
  Administered 2023-05-13: 1000 ug via INTRAMUSCULAR

## 2023-05-13 NOTE — Progress Notes (Signed)
ECHOCARDIOGRAM REPORT   Patient Name:   Ellen Drake Date of Exam: 03/17/2023 Medical Rec #:  [id number]         Height:       66.0 in Accession #:    [id number]        Weight:       140.5 lb Date of Birth:  07-13-1946         BSA:          1.721 m Patient Age:    77 years          BP:           156/43 mmHg Patient Gender: F                 HR:           44 bpm. Exam Location:  Inpatient Procedure: 2D Echo, Cardiac Doppler and Color Doppler Indications:    second degree heart block  History:        Patient has no prior history of  Echocardiogram examinations.                 Risk Factors:Hypertension and Dyslipidemia.  Sonographer:    Delcie Roch RDCS Referring Phys: [phone] BRIDGETTE CHRISTOPHER IMPRESSIONS  1. Intracavitary gradient. Peak gradient 1.7 m/s. Peak gradient 5.5 mmHg. Left ventricular ejection fraction, by estimation, is 60 to 65%. The left ventricle has normal function. The left ventricle has no regional wall motion abnormalities. Left ventricular diastolic parameters are consistent with Grade I diastolic dysfunction (impaired relaxation).  2. Right ventricular systolic function is normal. The right ventricular size is normal.  3. Left atrial size was mildly dilated.  4. The mitral valve is normal in structure. Mild mitral valve regurgitation. No evidence of mitral stenosis.  5. Aortic outflow gradients are mildly elevated. The aortic valve appears normal. Cannot exclude a subvalvular or supravalvular membrane. The aortic valve is tricuspid. Aortic valve regurgitation is not visualized. Mild aortic valve stenosis. Aortic valve area, by VTI measures 2.03 cm. Aortic valve mean gradient measures 10.0 mmHg. Aortic valve Vmax measures 2.23 m/s.  6. The inferior vena cava is normal in size with greater than 50% respiratory variability, suggesting right atrial pressure of 3 mmHg. FINDINGS  Left Ventricle: Intracavitary gradient. Peak gradient 1.7 m/s. Peak gradient 5.5 mmHg. Left ventricular ejection fraction, by estimation, is 60 to 65%. The left ventricle has normal function. The left ventricle has no regional wall motion abnormalities.  The left ventricular internal cavity size was normal in size. There is no left ventricular hypertrophy. Left ventricular diastolic parameters are consistent with Grade I diastolic dysfunction (impaired relaxation). Right Ventricle: The right ventricular size is normal. No increase in right ventricular wall thickness. Right ventricular systolic function is normal. Left Atrium: Left atrial size  was mildly dilated. Right Atrium: Right atrial size was normal in size. Pericardium: Trivial pericardial effusion is present. Mitral Valve: The mitral valve is normal in structure. Mild mitral valve regurgitation. No evidence of mitral valve stenosis. Tricuspid Valve: The tricuspid valve is normal in structure. Tricuspid valve regurgitation is not demonstrated. No evidence of tricuspid stenosis. Aortic Valve: Aortic outflow gradients are mildly elevated. The aortic valve appears normal. Cannot exclude a subvalvular or supravalvular membrane. The aortic valve is tricuspid. Aortic valve regurgitation is not visualized. Mild aortic stenosis is present. Aortic valve mean gradient measures 10.0 mmHg. Aortic valve peak gradient measures 19.9 mmHg. Aortic valve area, by VTI measures 2.03 cm. Pulmonic Valve: The  Subjective:    Patient ID: Ellen Drake, female    DOB: 1946-02-27, 77 y.o.   MRN: [id number]  Patient here for  Chief Complaint  Patient presents with   Medical Management of Chronic Issues    4 mth f/u    HPI Here to follow up regarding hypercholesterolemia, hypertension and bradycardia. 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 Was admitted 03/16/23 - 03/20/23 - with 2:1 block.  Beta blocker held. Despite washout of her BB,  2:1 block persisted, LVEF 60-65%, and underwent implantation of a PPM.  Started on carvedilol. She is feeling better overall.  Energy is better.  No dizziness or light headedness.  Soreness over the pacemaker site.  No redness.  She is having increased left shoulder pain.  Limited rom.  She has had left shoulder issues previously and required cortisone injections.  No previous limited rom.  Since her hospitalization, she has noticed increased shoulder pain and limited rom.  Eating.  No abdominal pain or bowel change.    Past Medical History:  Diagnosis Date   Anemia    reslved   Colon polyp 2009   Crohn's disease (HCC)    Diverticulosis    Fibrocystic breast disease    Herniated nucleus pulposus of lumbosacral region    History of chicken pox    Hypercholesterolemia    Hyperlipidemia    Hypertension    Osteoarthritis    Pulmonary nodule seen on imaging study 2004   resolved 2007   Past Surgical History:  Procedure Laterality Date   anterior cervical discectomy and fusion  1998   Dr Alvy Beal SURGERY  1992   ruptured disc (Dr Gerrit Heck)   BREAST BIOPSY Left    neg-no scar seen   CATARACT EXTRACTION W/PHACO Right 02/09/2019   Procedure: CATARACT EXTRACTION PHACO AND INTRAOCULAR LENS PLACEMENT (IOC) KAHOOK DUAL BLADE GONIOTOMY  RIGHT HEALON 5, VISION BLUE;  Surgeon: Lockie Mola, MD;  Location: [facility];  Service: Ophthalmology;  Laterality: Right;   COLECTOMY  2003   COLON SURGERY  1998   COLONOSCOPY  2009    COLONOSCOPY W/ BIOPSIES  11/30/2012   Procedure: COLONOSCOPY W/BIOPSY; Surgeon: Benjie Karvonen, MD; Location: [facility]; Service: Gastroenterology;;   COLONOSCOPY W/ BIOPSIES  04/15/2016   Procedure: Colonoscopy ; Surgeon: Benjie Karvonen, MD; Location: [facility]; Service: Gastroenterology; Laterality: N/A;   KNEE ARTHROSCOPY W/ SYNOVECTOMY Right 03/10/2014   limited   KNEE SURGERY Right 1/16   [facility] (Dr. Ophelia Shoulder)   PACEMAKER IMPLANT N/A 03/19/2023   Procedure: PACEMAKER IMPLANT;  Surgeon: Regan Lemming, MD;  Location: [facility];  Service: Cardiovascular;  Laterality: N/A;   POSTERIOR LAMINECTOMY / DECOMPRESSION LUMBAR SPINE  1999   REPLACEMENT TOTAL KNEE     ROTATOR CUFF REPAIR Left 2007   acute open; accident   Family History  Problem Relation Age of Onset   CVA Mother    Hypertension Mother    Hypertension Sister        brain tumor   Breast cancer Neg Hx    Colon cancer Neg Hx    Social History   Socioeconomic History   Marital status: Married    Spouse name: Not on file   Number of children: 0   Years of education: 12   Highest education level: Not on file  Occupational History   Not on file  Tobacco Use  ECHOCARDIOGRAM REPORT   Patient Name:   Ellen Drake Date of Exam: 03/17/2023 Medical Rec #:  [id number]         Height:       66.0 in Accession #:    [id number]        Weight:       140.5 lb Date of Birth:  07-13-1946         BSA:          1.721 m Patient Age:    77 years          BP:           156/43 mmHg Patient Gender: F                 HR:           44 bpm. Exam Location:  Inpatient Procedure: 2D Echo, Cardiac Doppler and Color Doppler Indications:    second degree heart block  History:        Patient has no prior history of  Echocardiogram examinations.                 Risk Factors:Hypertension and Dyslipidemia.  Sonographer:    Delcie Roch RDCS Referring Phys: [phone] BRIDGETTE CHRISTOPHER IMPRESSIONS  1. Intracavitary gradient. Peak gradient 1.7 m/s. Peak gradient 5.5 mmHg. Left ventricular ejection fraction, by estimation, is 60 to 65%. The left ventricle has normal function. The left ventricle has no regional wall motion abnormalities. Left ventricular diastolic parameters are consistent with Grade I diastolic dysfunction (impaired relaxation).  2. Right ventricular systolic function is normal. The right ventricular size is normal.  3. Left atrial size was mildly dilated.  4. The mitral valve is normal in structure. Mild mitral valve regurgitation. No evidence of mitral stenosis.  5. Aortic outflow gradients are mildly elevated. The aortic valve appears normal. Cannot exclude a subvalvular or supravalvular membrane. The aortic valve is tricuspid. Aortic valve regurgitation is not visualized. Mild aortic valve stenosis. Aortic valve area, by VTI measures 2.03 cm. Aortic valve mean gradient measures 10.0 mmHg. Aortic valve Vmax measures 2.23 m/s.  6. The inferior vena cava is normal in size with greater than 50% respiratory variability, suggesting right atrial pressure of 3 mmHg. FINDINGS  Left Ventricle: Intracavitary gradient. Peak gradient 1.7 m/s. Peak gradient 5.5 mmHg. Left ventricular ejection fraction, by estimation, is 60 to 65%. The left ventricle has normal function. The left ventricle has no regional wall motion abnormalities.  The left ventricular internal cavity size was normal in size. There is no left ventricular hypertrophy. Left ventricular diastolic parameters are consistent with Grade I diastolic dysfunction (impaired relaxation). Right Ventricle: The right ventricular size is normal. No increase in right ventricular wall thickness. Right ventricular systolic function is normal. Left Atrium: Left atrial size  was mildly dilated. Right Atrium: Right atrial size was normal in size. Pericardium: Trivial pericardial effusion is present. Mitral Valve: The mitral valve is normal in structure. Mild mitral valve regurgitation. No evidence of mitral valve stenosis. Tricuspid Valve: The tricuspid valve is normal in structure. Tricuspid valve regurgitation is not demonstrated. No evidence of tricuspid stenosis. Aortic Valve: Aortic outflow gradients are mildly elevated. The aortic valve appears normal. Cannot exclude a subvalvular or supravalvular membrane. The aortic valve is tricuspid. Aortic valve regurgitation is not visualized. Mild aortic stenosis is present. Aortic valve mean gradient measures 10.0 mmHg. Aortic valve peak gradient measures 19.9 mmHg. Aortic valve area, by VTI measures 2.03 cm. Pulmonic Valve: The  Subjective:    Patient ID: Ellen Drake, female    DOB: 1946-02-27, 77 y.o.   MRN: [id number]  Patient here for  Chief Complaint  Patient presents with   Medical Management of Chronic Issues    4 mth f/u    HPI Here to follow up regarding hypercholesterolemia, hypertension and bradycardia. 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 Was admitted 03/16/23 - 03/20/23 - with 2:1 block.  Beta blocker held. Despite washout of her BB,  2:1 block persisted, LVEF 60-65%, and underwent implantation of a PPM.  Started on carvedilol. She is feeling better overall.  Energy is better.  No dizziness or light headedness.  Soreness over the pacemaker site.  No redness.  She is having increased left shoulder pain.  Limited rom.  She has had left shoulder issues previously and required cortisone injections.  No previous limited rom.  Since her hospitalization, she has noticed increased shoulder pain and limited rom.  Eating.  No abdominal pain or bowel change.    Past Medical History:  Diagnosis Date   Anemia    reslved   Colon polyp 2009   Crohn's disease (HCC)    Diverticulosis    Fibrocystic breast disease    Herniated nucleus pulposus of lumbosacral region    History of chicken pox    Hypercholesterolemia    Hyperlipidemia    Hypertension    Osteoarthritis    Pulmonary nodule seen on imaging study 2004   resolved 2007   Past Surgical History:  Procedure Laterality Date   anterior cervical discectomy and fusion  1998   Dr Alvy Beal SURGERY  1992   ruptured disc (Dr Gerrit Heck)   BREAST BIOPSY Left    neg-no scar seen   CATARACT EXTRACTION W/PHACO Right 02/09/2019   Procedure: CATARACT EXTRACTION PHACO AND INTRAOCULAR LENS PLACEMENT (IOC) KAHOOK DUAL BLADE GONIOTOMY  RIGHT HEALON 5, VISION BLUE;  Surgeon: Lockie Mola, MD;  Location: [facility];  Service: Ophthalmology;  Laterality: Right;   COLECTOMY  2003   COLON SURGERY  1998   COLONOSCOPY  2009    COLONOSCOPY W/ BIOPSIES  11/30/2012   Procedure: COLONOSCOPY W/BIOPSY; Surgeon: Benjie Karvonen, MD; Location: [facility]; Service: Gastroenterology;;   COLONOSCOPY W/ BIOPSIES  04/15/2016   Procedure: Colonoscopy ; Surgeon: Benjie Karvonen, MD; Location: [facility]; Service: Gastroenterology; Laterality: N/A;   KNEE ARTHROSCOPY W/ SYNOVECTOMY Right 03/10/2014   limited   KNEE SURGERY Right 1/16   [facility] (Dr. Ophelia Shoulder)   PACEMAKER IMPLANT N/A 03/19/2023   Procedure: PACEMAKER IMPLANT;  Surgeon: Regan Lemming, MD;  Location: [facility];  Service: Cardiovascular;  Laterality: N/A;   POSTERIOR LAMINECTOMY / DECOMPRESSION LUMBAR SPINE  1999   REPLACEMENT TOTAL KNEE     ROTATOR CUFF REPAIR Left 2007   acute open; accident   Family History  Problem Relation Age of Onset   CVA Mother    Hypertension Mother    Hypertension Sister        brain tumor   Breast cancer Neg Hx    Colon cancer Neg Hx    Social History   Socioeconomic History   Marital status: Married    Spouse name: Not on file   Number of children: 0   Years of education: 12   Highest education level: Not on file  Occupational History   Not on file  Tobacco Use  ECHOCARDIOGRAM REPORT   Patient Name:   Ellen Drake Date of Exam: 03/17/2023 Medical Rec #:  [id number]         Height:       66.0 in Accession #:    [id number]        Weight:       140.5 lb Date of Birth:  07-13-1946         BSA:          1.721 m Patient Age:    77 years          BP:           156/43 mmHg Patient Gender: F                 HR:           44 bpm. Exam Location:  Inpatient Procedure: 2D Echo, Cardiac Doppler and Color Doppler Indications:    second degree heart block  History:        Patient has no prior history of  Echocardiogram examinations.                 Risk Factors:Hypertension and Dyslipidemia.  Sonographer:    Delcie Roch RDCS Referring Phys: [phone] BRIDGETTE CHRISTOPHER IMPRESSIONS  1. Intracavitary gradient. Peak gradient 1.7 m/s. Peak gradient 5.5 mmHg. Left ventricular ejection fraction, by estimation, is 60 to 65%. The left ventricle has normal function. The left ventricle has no regional wall motion abnormalities. Left ventricular diastolic parameters are consistent with Grade I diastolic dysfunction (impaired relaxation).  2. Right ventricular systolic function is normal. The right ventricular size is normal.  3. Left atrial size was mildly dilated.  4. The mitral valve is normal in structure. Mild mitral valve regurgitation. No evidence of mitral stenosis.  5. Aortic outflow gradients are mildly elevated. The aortic valve appears normal. Cannot exclude a subvalvular or supravalvular membrane. The aortic valve is tricuspid. Aortic valve regurgitation is not visualized. Mild aortic valve stenosis. Aortic valve area, by VTI measures 2.03 cm. Aortic valve mean gradient measures 10.0 mmHg. Aortic valve Vmax measures 2.23 m/s.  6. The inferior vena cava is normal in size with greater than 50% respiratory variability, suggesting right atrial pressure of 3 mmHg. FINDINGS  Left Ventricle: Intracavitary gradient. Peak gradient 1.7 m/s. Peak gradient 5.5 mmHg. Left ventricular ejection fraction, by estimation, is 60 to 65%. The left ventricle has normal function. The left ventricle has no regional wall motion abnormalities.  The left ventricular internal cavity size was normal in size. There is no left ventricular hypertrophy. Left ventricular diastolic parameters are consistent with Grade I diastolic dysfunction (impaired relaxation). Right Ventricle: The right ventricular size is normal. No increase in right ventricular wall thickness. Right ventricular systolic function is normal. Left Atrium: Left atrial size  was mildly dilated. Right Atrium: Right atrial size was normal in size. Pericardium: Trivial pericardial effusion is present. Mitral Valve: The mitral valve is normal in structure. Mild mitral valve regurgitation. No evidence of mitral valve stenosis. Tricuspid Valve: The tricuspid valve is normal in structure. Tricuspid valve regurgitation is not demonstrated. No evidence of tricuspid stenosis. Aortic Valve: Aortic outflow gradients are mildly elevated. The aortic valve appears normal. Cannot exclude a subvalvular or supravalvular membrane. The aortic valve is tricuspid. Aortic valve regurgitation is not visualized. Mild aortic stenosis is present. Aortic valve mean gradient measures 10.0 mmHg. Aortic valve peak gradient measures 19.9 mmHg. Aortic valve area, by VTI measures 2.03 cm. Pulmonic Valve: The  Subjective:    Patient ID: Ellen Drake, female    DOB: 1946-02-27, 77 y.o.   MRN: [id number]  Patient here for  Chief Complaint  Patient presents with   Medical Management of Chronic Issues    4 mth f/u    HPI Here to follow up regarding hypercholesterolemia, hypertension and bradycardia. 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 Was admitted 03/16/23 - 03/20/23 - with 2:1 block.  Beta blocker held. Despite washout of her BB,  2:1 block persisted, LVEF 60-65%, and underwent implantation of a PPM.  Started on carvedilol. She is feeling better overall.  Energy is better.  No dizziness or light headedness.  Soreness over the pacemaker site.  No redness.  She is having increased left shoulder pain.  Limited rom.  She has had left shoulder issues previously and required cortisone injections.  No previous limited rom.  Since her hospitalization, she has noticed increased shoulder pain and limited rom.  Eating.  No abdominal pain or bowel change.    Past Medical History:  Diagnosis Date   Anemia    reslved   Colon polyp 2009   Crohn's disease (HCC)    Diverticulosis    Fibrocystic breast disease    Herniated nucleus pulposus of lumbosacral region    History of chicken pox    Hypercholesterolemia    Hyperlipidemia    Hypertension    Osteoarthritis    Pulmonary nodule seen on imaging study 2004   resolved 2007   Past Surgical History:  Procedure Laterality Date   anterior cervical discectomy and fusion  1998   Dr Alvy Beal SURGERY  1992   ruptured disc (Dr Gerrit Heck)   BREAST BIOPSY Left    neg-no scar seen   CATARACT EXTRACTION W/PHACO Right 02/09/2019   Procedure: CATARACT EXTRACTION PHACO AND INTRAOCULAR LENS PLACEMENT (IOC) KAHOOK DUAL BLADE GONIOTOMY  RIGHT HEALON 5, VISION BLUE;  Surgeon: Lockie Mola, MD;  Location: [facility];  Service: Ophthalmology;  Laterality: Right;   COLECTOMY  2003   COLON SURGERY  1998   COLONOSCOPY  2009    COLONOSCOPY W/ BIOPSIES  11/30/2012   Procedure: COLONOSCOPY W/BIOPSY; Surgeon: Benjie Karvonen, MD; Location: [facility]; Service: Gastroenterology;;   COLONOSCOPY W/ BIOPSIES  04/15/2016   Procedure: Colonoscopy ; Surgeon: Benjie Karvonen, MD; Location: [facility]; Service: Gastroenterology; Laterality: N/A;   KNEE ARTHROSCOPY W/ SYNOVECTOMY Right 03/10/2014   limited   KNEE SURGERY Right 1/16   [facility] (Dr. Ophelia Shoulder)   PACEMAKER IMPLANT N/A 03/19/2023   Procedure: PACEMAKER IMPLANT;  Surgeon: Regan Lemming, MD;  Location: [facility];  Service: Cardiovascular;  Laterality: N/A;   POSTERIOR LAMINECTOMY / DECOMPRESSION LUMBAR SPINE  1999   REPLACEMENT TOTAL KNEE     ROTATOR CUFF REPAIR Left 2007   acute open; accident   Family History  Problem Relation Age of Onset   CVA Mother    Hypertension Mother    Hypertension Sister        brain tumor   Breast cancer Neg Hx    Colon cancer Neg Hx    Social History   Socioeconomic History   Marital status: Married    Spouse name: Not on file   Number of children: 0   Years of education: 12   Highest education level: Not on file  Occupational History   Not on file  Tobacco Use

## 2023-05-14 ENCOUNTER — Ambulatory Visit: Payer: Self-pay

## 2023-05-14 NOTE — Patient Instructions (Signed)
Visit Information  Thank you for taking time to visit with me today. Please don't hesitate to contact me if I can be of assistance to you.   Following are the goals we discussed today:   Goals Addressed             This Visit's Progress    Continued improvement post surgical procedure.       Interventions Today    Flowsheet Row Most Recent Value  Chronic Disease   Chronic disease during today's visit Other  [bradycardia, status post pacemaker implant,  left shoulder pain]  General Interventions   General Interventions Discussed/Reviewed General Interventions Reviewed, Doctor Visits, Labs  Doctor Visits Discussed/Reviewed Doctor Visits Reviewed  Exercise Interventions   Exercise Discussed/Reviewed Physical Activity  [Discussed patients ROM in left arm.]  Education Interventions   Education Provided Provided Education  [Advised to continue ongoing monitoring of BP and pulse at least 2-3 times per week and record. Advised to schedule follow up appoitment with orthopedic regarding left shoulder]  Pharmacy Interventions   Pharmacy Dicussed/Reviewed Pharmacy Topics Reviewed              Our next appointment is by telephone on 06/29/23 at 10 am  Please call the care guide team at [phone] if you need to cancel or reschedule your appointment.   If you are experiencing a Mental Health or Behavioral Health Crisis or need someone to talk to, please call the Suicide and Crisis Lifeline: 988 call 1-800-273-TALK (toll free, 24 hour hotline)  The patient verbalized understanding of instructions, educational materials, and care plan provided today and agreed to receive a mailed copy of patient instructions, educational materials, and care plan.   [name] RN,BSN,CCM [facility] Care Coordination [phone] direct line

## 2023-05-14 NOTE — Patient Outreach (Signed)
Care Coordination   Follow Up Visit Note   05/14/2023 Name: Ellen Drake MRN: [id number] DOB: Mar 11, 1946  Ellen Drake is a 77 y.o. year old female who sees Dale Enterprise, MD for primary care. I spoke with  Ellen Drake Ellen Drake by phone today.  What matters to the patients health and wellness today?  Patient states she is doing well since recent surgery for pacemaker. She reports ongoing monitoring of her blood pressure and pulse.  She reports BP on yesterday was 130/70 with pulse of 66.  She denies having any further dizziness/ lightheadedness since having pacemaker.  Patient states she continues to have left shoulder pain. Reports pain level today is a 5 on 10 point scale.  She reports having a follow up visit with her primary care provider on yesterday and was advised to follow up with the orthopedic doctor.    Goals Addressed             This Visit's Progress    Continued improvement post surgical procedure.       Interventions Today    Flowsheet Row Most Recent Value  Chronic Disease   Chronic disease during today's visit Other  [bradycardia, status post pacemaker implant,  left shoulder pain]  General Interventions   General Interventions Discussed/Reviewed General Interventions Reviewed, Doctor Visits, Labs  Doctor Visits Discussed/Reviewed Doctor Visits Reviewed  Exercise Interventions   Exercise Discussed/Reviewed Physical Activity  [Discussed patients ROM in left arm.]  Education Interventions   Education Provided Provided Education  [Advised to continue ongoing monitoring of BP and pulse at least 2-3 times per week and record. Advised to schedule follow up appoitment with orthopedic regarding left shoulder]  Pharmacy Interventions   Pharmacy Dicussed/Reviewed Pharmacy Topics Reviewed              SDOH assessments and interventions completed:  No     Care Coordination Interventions:  Yes, provided   Follow up plan: Follow up call scheduled for  06/29/23    Encounter Outcome:  Patient Visit Completed   [name] RN,BSN,CCM [facility] Care Coordination [phone] direct line

## 2023-05-17 ENCOUNTER — Encounter: Payer: Self-pay | Admitting: Internal Medicine

## 2023-05-17 NOTE — Assessment & Plan Note (Signed)
Check cbc 

## 2023-05-17 NOTE — Assessment & Plan Note (Signed)
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 Was admitted 03/16/23 - 03/20/23 - with 2:1 block.  Beta blocker held. Despite washout of her BB,  2:1 block persisted, LVEF 60-65%, and underwent implantation of a PPM.  Started on carvedilol. She is feeling better overall.  Energy is better.  No dizziness or light headedness.

## 2023-05-17 NOTE — Assessment & Plan Note (Signed)
Continue lisinopril, amlodipine, hydrochlorothiazide.  Recently added carvedilol - after pacemaker placement. Follow pressures. Follow metabolic panel.

## 2023-05-17 NOTE — Assessment & Plan Note (Signed)
Has had several surgeries on her knee.  Now being followed by pain clinic.

## 2023-05-17 NOTE — Assessment & Plan Note (Signed)
Continue crestor.  Follow lipid panel and liver function tests.  

## 2023-05-17 NOTE — Assessment & Plan Note (Signed)
Has been receiving B12 injections.  Recheck B12 level today.  May be able to switch to oral B12.

## 2023-05-17 NOTE — Assessment & Plan Note (Signed)
S/p pacemaker placement 03/2023.  Doing well. Continue f/u with cardiology.

## 2023-05-17 NOTE — Assessment & Plan Note (Signed)
Stable.  Continue sulfasalazine.  Has been followed by GI ([facility]).

## 2023-05-17 NOTE — Assessment & Plan Note (Signed)
Has had shoulder pain in both shoulders.  Has seen ortho.  Impingement of left shoulder s/p injection.  Since her pacemaker placement, has had increased left shoulder pain and some limited rob.  Discussed further evaluation, including xray, f/u with ortho.  She declines. Continue gentle stretches. Notify me if changes her mind.

## 2023-05-18 ENCOUNTER — Telehealth: Payer: Self-pay

## 2023-05-18 ENCOUNTER — Other Ambulatory Visit: Payer: Self-pay

## 2023-05-18 MED ORDER — HYDROCHLOROTHIAZIDE 12.5 MG PO CAPS: 12.5000 mg | ORAL_CAPSULE | Freq: Every day | ORAL | 1 refills | Status: DC

## 2023-05-18 NOTE — Telephone Encounter (Signed)
The pt called stating that she is having some swelling by the device site and it has been hurting for two days. I asked her if she can come in today at 2 but her car is being fixed and she cannot come until tomorrow. I scheduled her for Tomorrow at 2pm.

## 2023-05-19 ENCOUNTER — Ambulatory Visit: Payer: Medicare HMO | Attending: Internal Medicine

## 2023-05-19 ENCOUNTER — Other Ambulatory Visit: Payer: Self-pay | Admitting: *Deleted

## 2023-05-19 ENCOUNTER — Other Ambulatory Visit: Payer: Self-pay

## 2023-05-19 ENCOUNTER — Telehealth: Payer: Self-pay | Admitting: Cardiology

## 2023-05-19 DIAGNOSIS — I441 Atrioventricular block, second degree: Secondary | ICD-10-CM

## 2023-05-19 MED ORDER — CARVEDILOL 12.5 MG PO TABS: 12.5000 mg | ORAL_TABLET | Freq: Two times a day (BID) | ORAL | 11 refills | Status: DC

## 2023-05-19 NOTE — Telephone Encounter (Signed)
*  STAT* If patient is at the pharmacy, call can be transferred to refill team.   1. Which medications need to be refilled? (please list name of each medication and dose if known) carvedilol (COREG) 12.5 MG tablet   2. Which pharmacy/location (including street and city if local pharmacy) is medication to be sent to?  [facility] - [place], [place] - [address]      3. Do they need a 30 day or 90 day supply? 90 day    Pt is out of medication

## 2023-05-19 NOTE — Telephone Encounter (Signed)
Pt has an upcoming appt with Dr. Elberta Fortis on 06/25/23. Would Dr. Elberta Fortis like to refill this medication until appt time? Please address

## 2023-05-19 NOTE — Progress Notes (Signed)
Patient seen today for swelling at left shoulder. Should has swelling and warm to touch. Patient normally gets cortisone shots every month although has not had since device was placed. Patient does not have any swelling in left arm or at device site. Device site appears remarkable. No redness, swelling or drainage noted. Patient advised to contact her ortho provider to discuss her infections. Pt voiced understanding and agreeable to plan.

## 2023-05-20 NOTE — Telephone Encounter (Signed)
Pt not home when I called to discuss. Informed gentleman that answered I will try to call her back tomorrow/Friday.

## 2023-05-21 DIAGNOSIS — M75121 Complete rotator cuff tear or rupture of right shoulder, not specified as traumatic: Secondary | ICD-10-CM | POA: Diagnosis not present

## 2023-05-22 MED ORDER — CARVEDILOL 12.5 MG PO TABS: 12.5000 mg | ORAL_TABLET | Freq: Two times a day (BID) | ORAL | 3 refills | Status: DC

## 2023-05-22 NOTE — Telephone Encounter (Signed)
Pt would like Rx sent to [facility]. Sent in as requested. Pt appreciates the help with this.

## 2023-05-26 NOTE — Patient Instructions (Incomplete)

## 2023-05-26 NOTE — Progress Notes (Unsigned)
PROVIDER NOTE: Information contained herein reflects review and annotations entered in association with encounter. Interpretation of such information and data should be left to medically-trained personnel. Information provided to patient can be located elsewhere in the medical record under "Patient Instructions". Document created using STT-dictation technology, any transcriptional errors that may result from process are unintentional.    Patient: Ellen Drake Ellen Drake  Service Category: E/M  Provider: Oswaldo Done, MD  DOB: March 29, 1946  DOS: 05/27/2023  Referring Provider: Dale [place], MD  MRN: [id number]  Specialty: Interventional Pain Management  PCP: Dale Fair[place], MD  Type: Established Patient  Setting: Ambulatory outpatient    Location: Office  Delivery: Face-to-face     HPI  Ellen Drake, a 77 y.o. year old female, is here today because of her No primary diagnosis found.. Ms. Shoun's primary complain today is No chief complaint on file.  Pertinent problems: Ellen Drake has Arthritis; Chronic knee pain (1ry area of Pain) (Right); S/P revision of total knee (Right); Osteoarthritis of knee (Right); Right leg swelling; Shoulder pain, bilateral; Disorder of the skin and subcutaneous tissue, unspecified; Synovitis of knee; History of unicompartmental knee replacement (Right); Chronic pain syndrome; Chronic knee pain s/p TKR (Right); Pain and swelling of knee (Right); Neurogenic pain; Left shoulder pain; Head trauma; and Leg cramps on their pertinent problem list. Pain Assessment: Severity of   is reported as a  /10. Location:    / . Onset:  . Quality:  . Timing:  . Modifying factor(s):  Marland Kitchen Vitals:  vitals were not taken for this visit.  BMI: Estimated body mass index is 23.12 kg/m as calculated from the following:   Height as of 05/13/23: 5\' 6"  (1.676 m).   Weight as of 05/13/23: 143 lb 4 oz (65 kg). Last encounter: 11/03/2022. Last procedure: Visit date not found.  Reason for  encounter: medication management. ***  Pharmacotherapy Assessment  Analgesic: Tramadol 50 mg 1 tab PO BID (100 mg/Drake of tramadol) (10 MME) MME/Drake: 10 mg/Drake   Monitoring: [place] PMP: PDMP reviewed during this encounter.       Pharmacotherapy: No side-effects or adverse reactions reported. Compliance: No problems identified. Effectiveness: Clinically acceptable.  No notes on file  No results found for: "CBDTHCR" No results found for: "D8THCCBX" No results found for: "D9THCCBX"  UDS:  Summary  Date Value Ref Range Status  05/13/2022 Note  Final    Comment:    ==================================================================== [employer] Select 13 (MW) ==================================================================== Test                             Result       Flag       Units  Drug Present and Declared for Prescription Verification   Tramadol                       >3226        EXPECTED   ng/mg creat   O-Desmethyltramadol            >3226        EXPECTED   ng/mg creat   N-Desmethyltramadol            1348         EXPECTED   ng/mg creat    Source of tramadol is a prescription medication. O-desmethyltramadol    and N-desmethyltramadol are expected metabolites of tramadol.  ==================================================================== Test  Result    Flag   Units      Ref Range   Creatinine              155              mg/dL      >=14 ==================================================================== Declared Medications:  The flagging and interpretation on this report are based on the  following declared medications.  Unexpected results may arise from  inaccuracies in the declared medications.   **Note: The testing scope of this panel includes these medications:   Tramadol (Ultram)   **Note: The testing scope of this panel does not include the  following reported medications:   Amlodipine (Norvasc)  Aspirin  Atenolol (Tenormin)   Betamethasone (Lotrisone)  Clotrimazole (Lotrisone)  Eye Drop  Gabapentin (Neurontin)  Hydrochlorothiazide (Microzide)  Lisinopril (Zestril)  Nystatin  Prednisone (Deltasone)  Rosuvastatin (Crestor)  Sulfasalazine (Azulfidine) ==================================================================== For clinical consultation, please call [phone]. ====================================================================       ROS  Constitutional: Denies any fever or chills Gastrointestinal: No reported hemesis, hematochezia, vomiting, or acute GI distress Musculoskeletal: Denies any acute onset joint swelling, redness, loss of ROM, or weakness Neurological: No reported episodes of acute onset apraxia, aphasia, dysarthria, agnosia, amnesia, paralysis, loss of coordination, or loss of consciousness  Medication Review  Aspirin, amLODipine, carvedilol, hydrochlorothiazide, latanoprost, magnesium oxide, potassium chloride, rosuvastatin, sulfaSALAzine, timolol, traMADol, and triamcinolone cream  History Review  Allergy: Ellen Drake is allergic to mercaptopurine, lyrica [pregabalin], and dilaudid [hydromorphone hcl]. Drug: Ellen Drake  reports no history of drug use. Alcohol:  reports current alcohol use of about 1.0 standard drink of alcohol per week. Tobacco:  reports that she quit smoking about 13 years ago. Her smoking use included cigarettes. She has never used smokeless tobacco. Social: Ellen Drake  reports that she quit smoking about 13 years ago. Her smoking use included cigarettes. She has never used smokeless tobacco. She reports current alcohol use of about 1.0 standard drink of alcohol per week. She reports that she does not use drugs. Medical:  has a past medical history of Anemia, Colon polyp (2009), Crohn's disease (HCC), Diverticulosis, Fibrocystic breast disease, Herniated nucleus pulposus of lumbosacral region, History of chicken pox, Hypercholesterolemia, Hyperlipidemia,  Hypertension, Osteoarthritis, and Pulmonary nodule seen on imaging study (2004). Surgical: Ellen Drake  has a past surgical history that includes Back surgery (1992); anterior cervical discectomy and fusion (1998); Knee surgery (Right, 1/16); Colonoscopy (2009); Colon surgery (1998); Posterior laminectomy / decompression lumbar spine (1999); Rotator cuff repair (Left, 2007); Colonoscopy w/ biopsies (11/30/2012); Colectomy (2003); Replacement total knee; Knee arthroscopy w/ synovectomy (Right, 03/10/2014); Colonoscopy w/ biopsies (04/15/2016); Cataract extraction w/PHACO (Right, 02/09/2019); Breast biopsy (Left); and PACEMAKER IMPLANT (N/A, 03/19/2023). Family: family history includes CVA in her mother; Hypertension in her mother and sister.  Laboratory Chemistry Profile   Renal Lab Results  Component Value Date   BUN 16 05/13/2023   CREATININE 0.89 05/13/2023   GFR 62.51 05/13/2023   GFRNONAA 58 (L) 03/20/2023    Hepatic Lab Results  Component Value Date   AST 19 05/13/2023   ALT 8 05/13/2023   ALBUMIN 4.1 05/13/2023   ALKPHOS 67 05/13/2023    Electrolytes Lab Results  Component Value Date   NA 142 05/13/2023   K 3.9 05/13/2023   CL 106 05/13/2023   CALCIUM 9.9 05/13/2023   MG 1.9 10/28/2022    Bone Lab Results  Component Value Date   VD25OH 8.33 (L) 01/14/2021    Inflammation (CRP:  Acute Phase) (ESR: Chronic Phase) Lab Results  Component Value Date   CRP 4.9 03/09/2023   ESRSEDRATE 32 (H) 03/09/2023         Note: Above Lab results reviewed.  Recent Imaging Review  CUP PACEART INCLINIC DEVICE CHECK Wound check appointment. Steri-strips removed. Wound without redness or edema. Incision edges approximated, wound well healed. Normal device function. Thresholds, sensing, and impedances consistent with implant measurements. Device programmed at  3.5V/auto capture programmed on for extra safety margin until 3 month visit. Histogram distribution appropriate for patient and level of  activity. No mode switches or high ventricular rates noted. Patient educated about wound care, arm mobility, lifting  restrictions. ROV in 3 months with implanting physician.  SEE ATTACHMENT FOR FULL INTERROGATION DETAILS, MW,RN.Syliva Overman, RN Note: Reviewed        Physical Exam  General appearance: Well nourished, well developed, and well hydrated. In no apparent acute distress Mental status: Alert, oriented x 3 (person, place, & time)       Respiratory: No evidence of acute respiratory distress Eyes: PERLA Vitals: LMP 08/13/1995  BMI: Estimated body mass index is 23.12 kg/m as calculated from the following:   Height as of 05/13/23: 5\' 6"  (1.676 m).   Weight as of 05/13/23: 143 lb 4 oz (65 kg). Ideal: Ideal body weight: 59.3 kg (130 lb 11.7 oz) Adjusted ideal body weight: 61.6 kg (135 lb 11.8 oz)  Assessment   Diagnosis Status  No diagnosis found. Controlled Controlled Controlled   Updated Problems: No problems updated.  Plan of Care  Problem-specific:  No problem-specific Assessment & Plan notes found for this encounter.  Ms. Tranese Drake Lickliter has a current medication list which includes the following long-term medication(s): amlodipine, carvedilol, hydrochlorothiazide, klor-con m10, rosuvastatin, sulfasalazine, and tramadol.  Pharmacotherapy (Medications Ordered): No orders of the defined types were placed in this encounter.  Orders:  No orders of the defined types were placed in this encounter.  Follow-up plan:   No follow-ups on file.      Interventional Therapies  Risk  Complexity Considerations:   Allergy: Lyrica, Dilaudid Note: Does not want SCS    Planned  Pending:   Therapeutic right genicular nerve RFA #2    Under consideration:   Diagnostic right Lumbar spinal cord stimulator trial (Does not want.)   Completed:   Diagnostic right genicular NB x1 (01/17/2021) (100/100/0/0)  Therapeutic right genicular nerve RFA x1 (03/07/2021) (100/100/100/90)     Therapeutic  Palliative (PRN) options:   Palliative right genicular nerve RFA    Pharmacotherapy  Nonopioids transferred 05/08/2021: Gabapentin        Recent Visits No visits were found meeting these conditions. Showing recent visits within past 90 days and meeting all other requirements Future Appointments Date Type Provider Dept  05/27/23 Appointment Delano Metz, MD [facility]  Showing future appointments within next 90 days and meeting all other requirements  I discussed the assessment and treatment plan with the patient. The patient was provided an opportunity to ask questions and all were answered. The patient agreed with the plan and demonstrated an understanding of the instructions.  Patient advised to call back or seek an in-person evaluation if the symptoms or condition worsens.  Duration of encounter: *** minutes.  Total time on encounter, as per AMA guidelines included both the face-to-face and non-face-to-face time personally spent by the physician and/or other qualified health care professional(s) on the Drake of the encounter (includes time in activities that require the physician or  other qualified health care professional and does not include time in activities normally performed by clinical staff). Physician's time may include the following activities when performed: Preparing to see the patient (e.g., pre-charting review of records, searching for previously ordered imaging, lab work, and nerve conduction tests) Review of prior analgesic pharmacotherapies. Reviewing PMP Interpreting ordered tests (e.g., lab work, imaging, nerve conduction tests) Performing post-procedure evaluations, including interpretation of diagnostic procedures Obtaining and/or reviewing separately obtained history Performing a medically appropriate examination and/or evaluation Counseling and educating the patient/family/caregiver Ordering medications, tests, or  procedures Referring and communicating with other health care professionals (when not separately reported) Documenting clinical information in the electronic or other health record Independently interpreting results (not separately reported) and communicating results to the patient/ family/caregiver Care coordination (not separately reported)  Note by: Oswaldo Done, MD Date: 05/27/2023; Time: 10:45 AM

## 2023-05-27 ENCOUNTER — Encounter: Payer: Self-pay | Admitting: Pain Medicine

## 2023-05-27 ENCOUNTER — Ambulatory Visit: Payer: Medicare HMO | Attending: Pain Medicine | Admitting: Pain Medicine

## 2023-05-27 VITALS — BP 151/62 | HR 78 | Temp 97.2°F | Ht 66.0 in | Wt 142.0 lb

## 2023-05-27 DIAGNOSIS — G894 Chronic pain syndrome: Secondary | ICD-10-CM | POA: Insufficient documentation

## 2023-05-27 DIAGNOSIS — Z96651 Presence of right artificial knee joint: Secondary | ICD-10-CM | POA: Insufficient documentation

## 2023-05-27 DIAGNOSIS — M25561 Pain in right knee: Secondary | ICD-10-CM | POA: Insufficient documentation

## 2023-05-27 DIAGNOSIS — Z79899 Other long term (current) drug therapy: Secondary | ICD-10-CM | POA: Insufficient documentation

## 2023-05-27 DIAGNOSIS — Z79891 Long term (current) use of opiate analgesic: Secondary | ICD-10-CM | POA: Insufficient documentation

## 2023-05-27 DIAGNOSIS — G8929 Other chronic pain: Secondary | ICD-10-CM | POA: Insufficient documentation

## 2023-05-27 MED ORDER — TRAMADOL HCL 50 MG PO TABS
50.0000 mg | ORAL_TABLET | Freq: Two times a day (BID) | ORAL | 5 refills | Status: DC
Start: 2023-05-27 — End: 2023-12-22

## 2023-05-27 NOTE — Progress Notes (Signed)
Safety precautions to be maintained throughout the outpatient stay will include: orient to surroundings, keep bed in low position, maintain call bell within reach at all times, provide assistance with transfer out of bed and ambulation.   Nursing Pain Medication Assessment:  Safety precautions to be maintained throughout the outpatient stay will include: orient to surroundings, keep bed in low position, maintain call bell within reach at all times, provide assistance with transfer out of bed and ambulation.  Medication Inspection Compliance: Pill count conducted under aseptic conditions, in front of the patient. Neither the pills nor the bottle was removed from the patient's sight at any time. Once count was completed pills were immediately returned to the patient in their original bottle.  Medication: Tramadol (Ultram) Pill/Patch Count:  12 of 60 pills remain Pill/Patch Appearance: Markings consistent with prescribed medication Bottle Appearance: Standard pharmacy container. Clearly labeled. Filled Date: 52 / 21 / 2024 Last Medication intake:  Yesterday

## 2023-05-31 LAB — TOXASSURE SELECT 13 (MW), URINE

## 2023-06-08 DIAGNOSIS — H401133 Primary open-angle glaucoma, bilateral, severe stage: Secondary | ICD-10-CM | POA: Diagnosis not present

## 2023-06-08 DIAGNOSIS — H2512 Age-related nuclear cataract, left eye: Secondary | ICD-10-CM | POA: Diagnosis not present

## 2023-06-08 DIAGNOSIS — Z961 Presence of intraocular lens: Secondary | ICD-10-CM | POA: Diagnosis not present

## 2023-06-11 ENCOUNTER — Telehealth: Payer: Self-pay | Admitting: Cardiology

## 2023-06-11 NOTE — Telephone Encounter (Signed)
Pre-operative Risk Assessment    Patient Name: Ellen Drake  DOB: 24-Feb-1946 MRN: [id number]      Request for Surgical Clearance    Procedure: FIRST CLEANING, COULD BE DEEP CLEANING UNSURE AT THIS TIME.   Date of Surgery:  Clearance TBD                                 Surgeon:   Surgeon's Group or Practice Name: [facility]  Phone number:  [phone]  Fax number:  [phone]    Type of Clearance Requested:   - Medical    Type of Anesthesia:  Not Indicated   Additional requests/questions:  Does this patient need antibiotics?  Signed, Regis Bill   06/11/2023, 10:28 AM

## 2023-06-11 NOTE — Telephone Encounter (Signed)
Called to say that havent did the exam yet to know how many extraction. Stated they just need the questions at the bottom of the form answer and signed. Please advise

## 2023-06-11 NOTE — Telephone Encounter (Signed)
Patient Name: Ellen Drake  DOB: 1946/04/26 MRN: [id number]  Primary Cardiologist: Jodelle Red, MD  Chart reviewed as part of pre-operative protocol coverage.   Dental extractions of 1-2 teeth and dental cleanings are considered low risk procedures per guidelines and generally do not require any specific cardiac clearance. It is also generally accepted that for extractions of 1-2 teeth and dental cleanings, there is no need to interrupt blood thinner therapy.  SBE prophylaxis is not required for the patient from a cardiac standpoint.  I will route this recommendation to the requesting party via Epic fax function and remove from pre-op pool.  Please call with questions.  Carlos Levering, NP 06/11/2023, 2:28 PM

## 2023-06-15 ENCOUNTER — Ambulatory Visit: Payer: Medicare HMO

## 2023-06-15 NOTE — Progress Notes (Deleted)
Pt presented for their vitamin B12 injection. Pt was identified through two identifiers. Pt tolerated shot well in their left or right deltoid.  

## 2023-06-16 ENCOUNTER — Telehealth: Payer: Self-pay

## 2023-06-16 ENCOUNTER — Encounter: Payer: Medicare HMO | Admitting: Cardiology

## 2023-06-16 NOTE — Telephone Encounter (Signed)
Insurance Treatment Denial Note  Date order was entered:  Order entered by: Delano Metz, MD Requested treatment: RFA genicular Reason for denial: Treatment not covered by plan. Recommended for approval:  not covered, not medically necessary

## 2023-06-17 DIAGNOSIS — E538 Deficiency of other specified B group vitamins: Secondary | ICD-10-CM | POA: Diagnosis not present

## 2023-06-17 DIAGNOSIS — K5010 Crohn's disease of large intestine without complications: Secondary | ICD-10-CM | POA: Diagnosis not present

## 2023-06-17 DIAGNOSIS — D126 Benign neoplasm of colon, unspecified: Secondary | ICD-10-CM | POA: Diagnosis not present

## 2023-06-17 DIAGNOSIS — Z9049 Acquired absence of other specified parts of digestive tract: Secondary | ICD-10-CM | POA: Diagnosis not present

## 2023-06-17 DIAGNOSIS — Z5181 Encounter for therapeutic drug level monitoring: Secondary | ICD-10-CM | POA: Diagnosis not present

## 2023-06-19 ENCOUNTER — Ambulatory Visit (INDEPENDENT_AMBULATORY_CARE_PROVIDER_SITE_OTHER): Payer: Medicare HMO

## 2023-06-19 DIAGNOSIS — I441 Atrioventricular block, second degree: Secondary | ICD-10-CM

## 2023-06-19 LAB — CUP PACEART REMOTE DEVICE CHECK
Battery Remaining Longevity: 146 mo
Battery Voltage: 3.19 V
Brady Statistic AP VP Percent: 15.41 %
Brady Statistic AP VS Percent: 0 %
Brady Statistic AS VP Percent: 84.58 %
Brady Statistic AS VS Percent: 0.01 %
Brady Statistic RA Percent Paced: 15.38 %
Brady Statistic RV Percent Paced: 99.99 %
Date Time Interrogation Session: 20241107193251
Implantable Lead Connection Status: 753985
Implantable Lead Connection Status: 753985
Implantable Lead Implant Date: 20240808
Implantable Lead Implant Date: 20240808
Implantable Lead Location: 753859
Implantable Lead Location: 753860
Implantable Lead Model: 3830
Implantable Lead Model: 5076
Implantable Pulse Generator Implant Date: 20240808
Lead Channel Impedance Value: 304 Ohm
Lead Channel Impedance Value: 361 Ohm
Lead Channel Impedance Value: 399 Ohm
Lead Channel Impedance Value: 627 Ohm
Lead Channel Pacing Threshold Amplitude: 0.375 V
Lead Channel Pacing Threshold Amplitude: 1.125 V
Lead Channel Pacing Threshold Pulse Width: 0.4 ms
Lead Channel Pacing Threshold Pulse Width: 0.4 ms
Lead Channel Sensing Intrinsic Amplitude: 26.125 mV
Lead Channel Sensing Intrinsic Amplitude: 26.125 mV
Lead Channel Sensing Intrinsic Amplitude: 3.25 mV
Lead Channel Sensing Intrinsic Amplitude: 3.25 mV
Lead Channel Setting Pacing Amplitude: 1.5 V
Lead Channel Setting Pacing Amplitude: 2.25 V
Lead Channel Setting Pacing Pulse Width: 0.4 ms
Lead Channel Setting Sensing Sensitivity: 1.2 mV
Zone Setting Status: 755011

## 2023-06-23 DIAGNOSIS — L932 Other local lupus erythematosus: Secondary | ICD-10-CM | POA: Diagnosis not present

## 2023-06-25 ENCOUNTER — Encounter: Payer: Medicare HMO | Admitting: Cardiology

## 2023-06-29 ENCOUNTER — Ambulatory Visit: Payer: Self-pay

## 2023-06-29 NOTE — Patient Outreach (Signed)
  Care Coordination   Follow Up Visit Note   06/29/2023 Name: Ellen Drake MRN: [id number] DOB: 1946/04/22  Ellen Drake is a 77 y.o. year old female who sees Dale Shirley, MD for primary care. I spoke with  Ellen Drake by phone today.  What matters to the patients health and wellness today?  Patient reports ongoing soreness at pacemaker implant site.  Patient denies any redness and/ or swelling at pacemaker site.  She states she is scheduled for a visit with the cardiologist on 07/02/23.  Patient reports having follow up visit regarding her left shoulder pain and having cortisone shot.  Patient reports having some pain relief with injection.      Goals Addressed             This Visit's Progress    Continued improvement post surgical procedure.       Interventions Today    Flowsheet Row Most Recent Value  Chronic Disease   Chronic disease during today's visit Other  [s/p pacemaker implant/ pain at sight,  left shoulder pain]  General Interventions   General Interventions Discussed/Reviewed General Interventions Reviewed, Doctor Visits  [evaluation of current treatment plan for mentioned health conditions and patients adherence to plan as established by provider. Assessed for ongoing pacemaker implant site pain and left shoulder pain]  Doctor Visits Discussed/Reviewed Doctor Visits Reviewed  [facility] patient has follow up visit scheduled with cardiology office.]  Pharmacy Interventions   Pharmacy Dicussed/Reviewed Pharmacy Topics Reviewed  [reviewed medication list. Advised to take medications as prescribed.]  Safety Interventions   Safety Discussed/Reviewed Safety Reviewed  Pearletha Furl for fall]              SDOH assessments and interventions completed:  No     Care Coordination Interventions:  Yes, provided   Follow up plan: Follow up call scheduled for 08/27/23    Encounter Outcome:  Patient Visit Completed   [name] RN,BSN,CCM [facility]   [facility], [facility] coordinator / Case Manager Phone: [phone]

## 2023-06-29 NOTE — Patient Instructions (Signed)
Visit Information  Thank you for taking time to visit with me today. Please don't hesitate to contact me if I can be of assistance to you.   Following are the goals we discussed today:   Goals Addressed             This Visit's Progress    Continued improvement post surgical procedure.       Interventions Today    Flowsheet Row Most Recent Value  Chronic Disease   Chronic disease during today's visit Other  [s/p pacemaker implant/ pain at sight,  left shoulder pain]  General Interventions   General Interventions Discussed/Reviewed General Interventions Reviewed, Doctor Visits  [evaluation of current treatment plan for mentioned health conditions and patients adherence to plan as established by provider. Assessed for ongoing pacemaker implant site pain and left shoulder pain]  Doctor Visits Discussed/Reviewed Doctor Visits Reviewed  [facility] patient has follow up visit scheduled with cardiology office.]  Pharmacy Interventions   Pharmacy Dicussed/Reviewed Pharmacy Topics Reviewed  [reviewed medication list. Advised to take medications as prescribed.]  Safety Interventions   Safety Discussed/Reviewed Safety Reviewed  Pearletha Furl for fall]              Our next appointment is by telephone on 08/27/23 at 10  am  Please call the care guide team at [phone] if you need to cancel or reschedule your appointment.   If you are experiencing a Mental Health or Behavioral Health Crisis or need someone to talk to, please call the Suicide and Crisis Lifeline: 988 call 1-800-273-TALK (toll free, 24 hour hotline)  Patient verbalizes understanding of instructions and care plan provided today and agrees to view in MyChart. Active MyChart status and patient understanding of how to access instructions and care plan via MyChart confirmed with patient.     [name] RN,BSN,CCM [place]  [facility], [facility] coordinator / Case Manager Phone:  [phone]

## 2023-06-30 NOTE — Progress Notes (Signed)
Remote pacemaker transmission.   

## 2023-07-01 NOTE — Progress Notes (Unsigned)
  Electrophysiology Office Note:   ID:  Ellen, Drake Jan 08, 1946, MRN [id number]  Primary Cardiologist: Jodelle Red, MD Electrophysiologist: Will Jorja Loa, MD  {Click to update primary MD,subspecialty MD or APP then REFRESH:1}    History of Present Illness:   Ellen Drake is a 77 y.o. 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   Since last being seen in our clinic the patient reports doing ***.  she denies chest pain, palpitations, dyspnea, PND, orthopnea, nausea, vomiting, dizziness, syncope, edema, weight gain, or early satiety.   Review of systems complete and found to be negative unless listed in HPI.   EP Information / Studies Reviewed:    EKG is ordered today. Personal review as below.       PPM Interrogation-  reviewed in detail today,  See PACEART report.  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

## 2023-07-02 ENCOUNTER — Encounter: Payer: Self-pay | Admitting: Student

## 2023-07-02 ENCOUNTER — Ambulatory Visit: Payer: Medicare HMO | Attending: Student | Admitting: Student

## 2023-07-02 VITALS — BP 146/80 | HR 66 | Ht 66.0 in | Wt 148.6 lb

## 2023-07-02 DIAGNOSIS — I10 Essential (primary) hypertension: Secondary | ICD-10-CM

## 2023-07-02 DIAGNOSIS — I441 Atrioventricular block, second degree: Secondary | ICD-10-CM

## 2023-07-02 LAB — CUP PACEART INCLINIC DEVICE CHECK
Battery Remaining Longevity: 146 mo
Battery Voltage: 3.19 V
Brady Statistic AP VP Percent: 15.95 %
Brady Statistic AP VS Percent: 0 %
Brady Statistic AS VP Percent: 84.04 %
Brady Statistic AS VS Percent: 0.01 %
Brady Statistic RA Percent Paced: 15.92 %
Brady Statistic RV Percent Paced: 99.99 %
Date Time Interrogation Session: 20241121130540
Implantable Lead Connection Status: 753985
Implantable Lead Connection Status: 753985
Implantable Lead Implant Date: 20240808
Implantable Lead Implant Date: 20240808
Implantable Lead Location: 753859
Implantable Lead Location: 753860
Implantable Lead Model: 3830
Implantable Lead Model: 5076
Implantable Pulse Generator Implant Date: 20240808
Lead Channel Impedance Value: 304 Ohm
Lead Channel Impedance Value: 380 Ohm
Lead Channel Impedance Value: 418 Ohm
Lead Channel Impedance Value: 627 Ohm
Lead Channel Pacing Threshold Amplitude: 0.5 V
Lead Channel Pacing Threshold Amplitude: 1.125 V
Lead Channel Pacing Threshold Pulse Width: 0.4 ms
Lead Channel Pacing Threshold Pulse Width: 0.4 ms
Lead Channel Sensing Intrinsic Amplitude: 26.125 mV
Lead Channel Sensing Intrinsic Amplitude: 26.125 mV
Lead Channel Sensing Intrinsic Amplitude: 3.375 mV
Lead Channel Sensing Intrinsic Amplitude: 3.625 mV
Lead Channel Setting Pacing Amplitude: 1.5 V
Lead Channel Setting Pacing Amplitude: 2.25 V
Lead Channel Setting Pacing Pulse Width: 0.4 ms
Lead Channel Setting Sensing Sensitivity: 1.2 mV
Zone Setting Status: 755011

## 2023-07-02 NOTE — Patient Instructions (Signed)
Medication Instructions:  Your physician recommends that you continue on your current medications as directed. Please refer to the Current Medication list given to you today.  *If you need a refill on your cardiac medications before your next appointment, please call your pharmacy*  Lab Work: None ordered If you have labs (blood work) drawn today and your tests are completely normal, you will receive your results only by: MyChart Message (if you have MyChart) OR A paper copy in the mail If you have any lab test that is abnormal or we need to change your treatment, we will call you to review the results.  Follow-Up: At [facility], you and your health needs are our priority.  As part of our continuing mission to provide you with exceptional heart care, we have created designated Provider Care Teams.  These Care Teams include your primary Cardiologist (physician) and Advanced Practice Providers (APPs -  Physician Assistants and Nurse Practitioners) who all work together to provide you with the care you need, when you need it.  We recommend signing up for the patient portal called "MyChart".  Sign up information is provided on this After Visit Summary.  MyChart is used to connect with patients for Virtual Visits (Telemedicine).  Patients are able to view lab/test results, encounter notes, upcoming appointments, etc.  Non-urgent messages can be sent to your provider as well.   To learn more about what you can do with MyChart, go to [email or web address].    Your next appointment:   6 month(s)  Provider:   Will Camnitz, MD  

## 2023-07-12 NOTE — Progress Notes (Signed)
Subjective:    Patient ID: Ellen Drake, female    DOB: [birth year], 77 y.o.   MRN: [id number]  Patient here for  Chief Complaint  Patient presents with   Medical Management of Chronic Issues    HPI Here to follow up regarding hypercholesterolemia, hypertension and bradycardia. Was admitted 03/16/23 - 03/20/23 - with 2:1 block. Beta blocker held. Despite washout of her BB, 2:1 block persisted, LVEF 60-65%, and underwent implantation of a PPM. Started on carvedilol.  Had f/u with cardiology 07/02/23 - normal PPM function. Had f/u with GI 06/17/23 - f/u Crohn's. Recommended continuing sulfasalazine.  Also recommended starting folic acid. Wanted to hold on f/u colonoscopy. She is taking the folic acid. Feels her bowels are stable. No chest pain or sob. Reports some nasal stuffiness and eye watering.  Saw ophthalmology. Eyes ok.  No increased pressure in her eyes.  No chest congestion or cough.  Appetite is good. No abdominal pain.  Some neck soreness - associates with her left shoulder pain.  S/p injection - helped some.  Continues to f/u with ortho.  Persistent right knee pain.  Also reports cramps - feet.  Notices at night.  Some numbness/tingling toes.  States her blood pressure is doing well.  Most outside readings 130 systolic.    Past Medical History:  Diagnosis Date   Anemia    reslved   Colon polyp 2009   Crohn's disease (HCC)    Diverticulosis    Fibrocystic breast disease    Herniated nucleus pulposus of lumbosacral region    History of chicken pox    Hypercholesterolemia    Hyperlipidemia    Hypertension    Osteoarthritis    Pulmonary nodule seen on imaging study 2004   resolved 2007   Past Surgical History:  Procedure Laterality Date   anterior cervical discectomy and fusion  1998   Dr Alvy Beal SURGERY  1992   ruptured disc (Dr Gerrit Heck)   BREAST BIOPSY Left    neg-no scar seen   CATARACT EXTRACTION W/PHACO Right 02/09/2019   Procedure: CATARACT EXTRACTION PHACO AND  INTRAOCULAR LENS PLACEMENT (IOC) KAHOOK DUAL BLADE GONIOTOMY  RIGHT HEALON 5, VISION BLUE;  Surgeon: Lockie Mola, MD;  Location: [facility];  Service: Ophthalmology;  Laterality: Right;   COLECTOMY  2003   COLON SURGERY  1998   COLONOSCOPY  2009   COLONOSCOPY W/ BIOPSIES  11/30/2012   Procedure: COLONOSCOPY W/BIOPSY; Surgeon: Benjie Karvonen, MD; Location: [facility]; Service: Gastroenterology;;   COLONOSCOPY W/ BIOPSIES  04/15/2016   Procedure: Colonoscopy ; Surgeon: Benjie Karvonen, MD; Location: [facility]; Service: Gastroenterology; Laterality: N/A;   KNEE ARTHROSCOPY W/ SYNOVECTOMY Right 03/10/2014   limited   KNEE SURGERY Right 1/16   [facility] (Dr. Ophelia Shoulder)   PACEMAKER IMPLANT N/A 03/19/2023   Procedure: PACEMAKER IMPLANT;  Surgeon: Regan Lemming, MD;  Location: [facility];  Service: Cardiovascular;  Laterality: N/A;   POSTERIOR LAMINECTOMY / DECOMPRESSION LUMBAR SPINE  1999   REPLACEMENT TOTAL KNEE     ROTATOR CUFF REPAIR Left 2007   acute open; accident   Family History  Problem Relation Age of Onset   CVA Mother    Hypertension Mother    Hypertension Sister        brain tumor   Breast cancer Neg Hx    Colon cancer Neg Hx    Social History   Socioeconomic History   Marital status: Married  Spouse name: Not on file   Number of children: 0   Years of education: 30   Highest education level: Not on file  Occupational History   Not on file  Tobacco Use   Smoking status: Former    Current packs/day: 0.00    Types: Cigarettes    Quit date: 09/10/2009    Years since quitting: 13.8   Smokeless tobacco: Never  Vaping Use   Vaping status: Never Used  Substance and Sexual Activity   Alcohol use: Yes    Alcohol/week: 1.0 standard drink of alcohol    Types: 1 Glasses of wine per week    Comment: wine occasional; socially   Drug use: No   Sexual activity: Yes  Other Topics Concern   Not on file  Social  History Narrative   Not on file   Social Determinants of Health   Financial Resource Strain: Low Risk  (07/18/2022)   Overall Financial Resource Strain (CARDIA)    Difficulty of Paying Living Expenses: Not hard at all  Food Insecurity: No Food Insecurity (03/26/2023)   Hunger Vital Sign    Worried About Running Out of Food in the Last Year: Never true    Ran Out of Food in the Last Year: Never true  Transportation Needs: No Transportation Needs (03/26/2023)   PRAPARE - Administrator, Civil Service (Medical): No    Lack of Transportation (Non-Medical): No  Physical Activity: Unknown (07/17/2021)   Exercise Vital Sign    Days of Exercise per Week: 0 days    Minutes of Exercise per Session: Not on file  Stress: No Stress Concern Present (07/18/2022)   [employer] of Occupational Health - Occupational Stress Questionnaire    Feeling of Stress : Not at all  Social Connections: Unknown (07/18/2022)   Social Connection and Isolation Panel [NHANES]    Frequency of Communication with Friends and Family: More than three times a week    Frequency of Social Gatherings with Friends and Family: More than three times a week    Attends Religious Services: Not on Marketing executive or Organizations: Yes    Attends Banker Meetings: More than 4 times per year    Marital Status: Married     Review of Systems  Constitutional:  Negative for appetite change and unexpected weight change.  HENT:  Negative for sinus pressure.        Nasal congestion - dripping.   Respiratory:  Negative for cough, chest tightness and shortness of breath.   Cardiovascular:  Negative for chest pain, palpitations and leg swelling.  Gastrointestinal:  Negative for abdominal pain, diarrhea, nausea and vomiting.  Genitourinary:  Negative for difficulty urinating and dysuria.  Musculoskeletal:  Negative for myalgias.       Left shoulder/neck pain as outlined. Persistent right knee pain.    Skin:  Negative for color change and rash.  Neurological:  Negative for dizziness and headaches.  Psychiatric/Behavioral:  Negative for agitation and dysphoric mood.        Objective:     BP 126/72   Pulse 75   Temp 98 F (36.7 C)   Resp 16   Ht 5\' 6"  (1.676 m)   Wt 145 lb (65.8 kg)   LMP 08/13/1995   SpO2 98%   BMI 23.40 kg/m  Wt Readings from Last 3 Encounters:  07/13/23 145 lb (65.8 kg)  07/02/23 148 lb 9.6 oz (67.4 kg)  05/27/23 142  lb (64.4 kg)    Physical Exam Vitals reviewed.  Constitutional:      General: She is not in acute distress.    Appearance: Normal appearance.  HENT:     Head: Normocephalic and atraumatic.     Right Ear: External ear normal.     Left Ear: External ear normal.     Mouth/Throat:     Pharynx: No oropharyngeal exudate or posterior oropharyngeal erythema.  Eyes:     General: No scleral icterus.       Right eye: No discharge.        Left eye: No discharge.     Conjunctiva/sclera: Conjunctivae normal.  Neck:     Thyroid: No thyromegaly.  Cardiovascular:     Rate and Rhythm: Normal rate and regular rhythm.  Pulmonary:     Effort: No respiratory distress.     Breath sounds: Normal breath sounds. No wheezing.  Abdominal:     General: Bowel sounds are normal.     Palpations: Abdomen is soft.     Tenderness: There is no abdominal tenderness.  Musculoskeletal:        General: No swelling or tenderness.     Cervical back: Neck supple. No tenderness.     Comments: Increased pain with rotation of her neck - left lateral neck.   Lymphadenopathy:     Cervical: No cervical adenopathy.  Skin:    Findings: No erythema or rash.  Neurological:     Mental Status: She is alert.     Comments: Decreased sensation - toes - improves mid foot.   Psychiatric:        Mood and Affect: Mood normal.        Behavior: Behavior normal.      Outpatient Encounter Medications as of 07/13/2023  Medication Sig   amLODipine (NORVASC) 10 MG tablet TAKE 1  TABLET EVERY DAY   ASPIRIN 81 PO Take 1 tablet by mouth daily.   carvedilol (COREG) 12.5 MG tablet Take 1 tablet (12.5 mg total) by mouth 2 (two) times daily.   folic acid (FOLVITE) 1 MG tablet Take 1 mg by mouth daily.   hydrochlorothiazide (MICROZIDE) 12.5 MG capsule Take 1 capsule (12.5 mg total) by mouth daily.   latanoprost (XALATAN) 0.005 % ophthalmic solution Place 1 drop into both eyes at bedtime.    magnesium oxide (MAG-OX) 400 (240 Mg) MG tablet Take 1 tablet (400 mg total) by mouth daily.   potassium chloride (KLOR-CON M10) 10 MEQ tablet TAKE 2 TABLETS BY MOUTH 2 TIMES DAILY.   rosuvastatin (CRESTOR) 10 MG tablet TAKE 1 TABLET EVERY DAY   sulfaSALAzine (AZULFIDINE) 500 MG tablet Take 1,500 mg by mouth 2 (two) times daily. 3 tabs   timolol (TIMOPTIC) 0.5 % ophthalmic solution Place 1 drop into both eyes 2 (two) times daily.   traMADol (ULTRAM) 50 MG tablet Take 1 tablet (50 mg total) by mouth 2 (two) times daily. Each refill must last 30 days.   triamcinolone cream (KENALOG) 0.1 % Apply 1 Application topically as needed (prn). As needed   No facility-administered encounter medications on file as of 07/13/2023.     Lab Results  Component Value Date   WBC 7.0 07/13/2023   HGB 11.9 (L) 07/13/2023   HCT 35.9 (L) 07/13/2023   PLT 180.0 07/13/2023   GLUCOSE 84 07/13/2023   CHOL 176 07/13/2023   TRIG 129.0 07/13/2023   HDL 68.10 07/13/2023   LDLDIRECT 132.5 05/06/2013   LDLCALC 82 07/13/2023  ALT 10 07/13/2023   AST 20 07/13/2023   NA 144 07/13/2023   K 3.8 07/13/2023   CL 106 07/13/2023   CREATININE 0.87 07/13/2023   BUN 19 07/13/2023   CO2 31 07/13/2023   TSH 2.22 05/13/2023   HGBA1C 5.7 10/19/2012    ECHOCARDIOGRAM COMPLETE  Result Date: 03/17/2023    ECHOCARDIOGRAM REPORT   Patient Name:   NASHLA PAULES Bilal Date of Exam: 03/17/2023 Medical Rec #:  [id number]         Height:       66.0 in Accession #:    [id number]        Weight:       140.5 lb Date of Birth:  [birth year]          BSA:          1.721 m Patient Age:    77 years          BP:           156/43 mmHg Patient Gender: F                 HR:           44 bpm. Exam Location:  Inpatient Procedure: 2D Echo, Cardiac Doppler and Color Doppler Indications:    second degree heart block  History:        Patient has no prior history of Echocardiogram examinations.                 Risk Factors:Hypertension and Dyslipidemia.  Sonographer:    Delcie Roch RDCS Referring Phys: [phone] BRIDGETTE CHRISTOPHER IMPRESSIONS  1. Intracavitary gradient. Peak gradient 1.7 m/s. Peak gradient 5.5 mmHg. Left ventricular ejection fraction, by estimation, is 60 to 65%. The left ventricle has normal function. The left ventricle has no regional wall motion abnormalities. Left ventricular diastolic parameters are consistent with Grade I diastolic dysfunction (impaired relaxation).  2. Right ventricular systolic function is normal. The right ventricular size is normal.  3. Left atrial size was mildly dilated.  4. The mitral valve is normal in structure. Mild mitral valve regurgitation. No evidence of mitral stenosis.  5. Aortic outflow gradients are mildly elevated. The aortic valve appears normal. Cannot exclude a subvalvular or supravalvular membrane. The aortic valve is tricuspid. Aortic valve regurgitation is not visualized. Mild aortic valve stenosis. Aortic valve area, by VTI measures 2.03 cm. Aortic valve mean gradient measures 10.0 mmHg. Aortic valve Vmax measures 2.23 m/s.  6. The inferior vena cava is normal in size with greater than 50% respiratory variability, suggesting right atrial pressure of 3 mmHg. FINDINGS  Left Ventricle: Intracavitary gradient. Peak gradient 1.7 m/s. Peak gradient 5.5 mmHg. Left ventricular ejection fraction, by estimation, is 60 to 65%. The left ventricle has normal function. The left ventricle has no regional wall motion abnormalities.  The left ventricular internal cavity size was normal in size. There is no left  ventricular hypertrophy. Left ventricular diastolic parameters are consistent with Grade I diastolic dysfunction (impaired relaxation). Right Ventricle: The right ventricular size is normal. No increase in right ventricular wall thickness. Right ventricular systolic function is normal. Left Atrium: Left atrial size was mildly dilated. Right Atrium: Right atrial size was normal in size. Pericardium: Trivial pericardial effusion is present. Mitral Valve: The mitral valve is normal in structure. Mild mitral valve regurgitation. No evidence of mitral valve stenosis. Tricuspid Valve: The tricuspid valve is normal in structure. Tricuspid valve regurgitation is not demonstrated. No evidence of tricuspid stenosis. Aortic  Valve: Aortic outflow gradients are mildly elevated. The aortic valve appears normal. Cannot exclude a subvalvular or supravalvular membrane. The aortic valve is tricuspid. Aortic valve regurgitation is not visualized. Mild aortic stenosis is present. Aortic valve mean gradient measures 10.0 mmHg. Aortic valve peak gradient measures 19.9 mmHg. Aortic valve area, by VTI measures 2.03 cm. Pulmonic Valve: The pulmonic valve was normal in structure. Pulmonic valve regurgitation is not visualized. No evidence of pulmonic stenosis. Aorta: The aortic root is normal in size and structure. Venous: The inferior vena cava is normal in size with greater than 50% respiratory variability, suggesting right atrial pressure of 3 mmHg. IAS/Shunts: No atrial level shunt detected by color flow Doppler.  LEFT VENTRICLE PLAX 2D LVIDd:         4.20 cm   Diastology LVIDs:         2.80 cm   LV e' medial:  13.60 cm/s LV PW:         0.90 cm   LV e' lateral: 16.30 cm/s LV IVS:        0.90 cm LVOT diam:     1.80 cm LV SV:         112 LV SV Index:   65 LVOT Area:     2.54 cm  RIGHT VENTRICLE             IVC RV Basal diam:  2.50 cm     IVC diam: 1.40 cm RV S prime:     13.10 cm/s TAPSE (M-mode): 2.3 cm LEFT ATRIUM             Index         RIGHT ATRIUM          Index LA diam:        3.40 cm 1.98 cm/m   RA Area:     9.88 cm LA Vol (A2C):   67.8 ml 39.39 ml/m  RA Volume:   18.70 ml 10.87 ml/m LA Vol (A4C):   48.1 ml 27.95 ml/m LA Biplane Vol: 57.0 ml 33.12 ml/m  AORTIC VALVE AV Area (Vmax):    2.06 cm AV Area (Vmean):   1.78 cm AV Area (VTI):     2.03 cm AV Vmax:           223.00 cm/s AV Vmean:          147.000 cm/s AV VTI:            0.553 m AV Peak Grad:      19.9 mmHg AV Mean Grad:      10.0 mmHg LVOT Vmax:         180.50 cm/s LVOT Vmean:        103.000 cm/s LVOT VTI:          0.442 m LVOT/AV VTI ratio: 0.80  AORTA Ao Root diam: 2.60 cm Ao Asc diam:  2.70 cm  SHUNTS Systemic VTI:  0.44 m Systemic Diam: 1.80 cm Chilton Si MD Electronically signed by Chilton Si MD Signature Date/Time: 03/17/2023/5:30:16 PM    Final    DG Chest 2 View  Result Date: 03/16/2023 CLINICAL DATA:  cp EXAM: CHEST - 2 VIEW COMPARISON:  None Available. FINDINGS: No pleural effusion. No pneumothorax. No airspace opacity. Normal cardiac and mediastinal contours. No radiographically apparent displaced rib fractures. Visualized upper abdomen is unremarkable. Vertebral body heights are maintained. Partially imaged cervical spinal fusion hardware in place. Degenerative changes of the bilateral glenohumeral joints, left-greater-than-right. IMPRESSION: No focal airspace opacity.  Electronically Signed   By: Lorenza Cambridge M.D.   On: 03/16/2023 14:14       Assessment & Plan:  Primary hypertension Assessment & Plan: Continue lisinopril, amlodipine, hydrochlorothiazide.  Recently added carvedilol - after pacemaker placement. Follow pressures. Follow metabolic panel.   Orders: -     Basic metabolic panel  Hypercholesterolemia Assessment & Plan: Continue crestor.  Follow lipid panel and liver function tests.   Orders: -     Hepatic function panel -     Lipid panel -     CBC with Differential/Platelet  Vitamin D deficiency Assessment & Plan: Check  vitamin D level with labs today.   Orders: -     VITAMIN D 25 Hydroxy (Vit-D Deficiency, Fractures)  Foot cramps Assessment & Plan: Stay hydrated.  Check electrolytes and magnesium.   Orders: -     Magnesium -     CBC with Differential/Platelet  Need for influenza vaccination -     Flu Vaccine Trivalent High Dose (Fluad)  Vitamin B12 deficiency Assessment & Plan: Receiving B12 injections.    Bilateral shoulder pain, unspecified chronicity Assessment & Plan: Has had shoulder pain in both shoulders.  Has seen ortho.  Impingement of left shoulder s/p injection.  Helped some. Continue gentle stretches. Continue f/u with ortho.   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  Doing well. Continue f/u with cardiology.    Pain and swelling of knee (Right) Assessment & Plan: Has had several surgeries on her knee.  Now being followed by pain clinic.     Leg cramps Assessment & Plan: Stay hydrated.  Check electrolytes, magnesium.  Stretches.     Crohn's disease of large intestine without complication (HCC) Assessment & Plan: Stable.  Continue sulfasalazine.  Has been followed by GI ([facility]).  Wanted to hold on colonoscopy.    Environmental allergies Assessment & Plan: Saline nasal spray/steroid nasal spray.  Follow.       [place], MD

## 2023-07-13 ENCOUNTER — Encounter: Payer: Self-pay | Admitting: Internal Medicine

## 2023-07-13 ENCOUNTER — Ambulatory Visit (INDEPENDENT_AMBULATORY_CARE_PROVIDER_SITE_OTHER): Payer: Medicare HMO | Admitting: Internal Medicine

## 2023-07-13 VITALS — BP 126/72 | HR 75 | Temp 98.0°F | Resp 16 | Ht 66.0 in | Wt 145.0 lb

## 2023-07-13 DIAGNOSIS — I441 Atrioventricular block, second degree: Secondary | ICD-10-CM

## 2023-07-13 DIAGNOSIS — Z23 Encounter for immunization: Secondary | ICD-10-CM | POA: Diagnosis not present

## 2023-07-13 DIAGNOSIS — M25561 Pain in right knee: Secondary | ICD-10-CM | POA: Diagnosis not present

## 2023-07-13 DIAGNOSIS — M25461 Effusion, right knee: Secondary | ICD-10-CM

## 2023-07-13 DIAGNOSIS — E538 Deficiency of other specified B group vitamins: Secondary | ICD-10-CM | POA: Diagnosis not present

## 2023-07-13 DIAGNOSIS — R252 Cramp and spasm: Secondary | ICD-10-CM | POA: Diagnosis not present

## 2023-07-13 DIAGNOSIS — M25511 Pain in right shoulder: Secondary | ICD-10-CM

## 2023-07-13 DIAGNOSIS — I10 Essential (primary) hypertension: Secondary | ICD-10-CM | POA: Diagnosis not present

## 2023-07-13 DIAGNOSIS — M25512 Pain in left shoulder: Secondary | ICD-10-CM

## 2023-07-13 DIAGNOSIS — E7800 Pure hypercholesterolemia, unspecified: Secondary | ICD-10-CM | POA: Diagnosis not present

## 2023-07-13 DIAGNOSIS — K5010 Crohn's disease of large intestine without complications: Secondary | ICD-10-CM

## 2023-07-13 DIAGNOSIS — E559 Vitamin D deficiency, unspecified: Secondary | ICD-10-CM | POA: Diagnosis not present

## 2023-07-13 DIAGNOSIS — Z9109 Other allergy status, other than to drugs and biological substances: Secondary | ICD-10-CM

## 2023-07-14 LAB — CBC WITH DIFFERENTIAL/PLATELET
Basophils Absolute: 0 10*3/uL (ref 0.0–0.1)
Basophils Relative: 0.5 % (ref 0.0–3.0)
Eosinophils Absolute: 0.1 10*3/uL (ref 0.0–0.7)
Eosinophils Relative: 1.7 % (ref 0.0–5.0)
HCT: 35.9 % — ABNORMAL LOW (ref 36.0–46.0)
Hemoglobin: 11.9 g/dL — ABNORMAL LOW (ref 12.0–15.0)
Lymphocytes Relative: 21.3 % (ref 12.0–46.0)
Lymphs Abs: 1.5 10*3/uL (ref 0.7–4.0)
MCHC: 33.1 g/dL (ref 30.0–36.0)
MCV: 97 fL (ref 78.0–100.0)
Monocytes Absolute: 0.6 10*3/uL (ref 0.1–1.0)
Monocytes Relative: 8.6 % (ref 3.0–12.0)
Neutro Abs: 4.8 10*3/uL (ref 1.4–7.7)
Neutrophils Relative %: 67.9 % (ref 43.0–77.0)
Platelets: 180 10*3/uL (ref 150.0–400.0)
RBC: 3.7 Mil/uL — ABNORMAL LOW (ref 3.87–5.11)
RDW: 14.3 % (ref 11.5–15.5)
WBC: 7 10*3/uL (ref 4.0–10.5)

## 2023-07-14 LAB — BASIC METABOLIC PANEL
BUN: 19 mg/dL (ref 6–23)
CO2: 31 meq/L (ref 19–32)
Calcium: 10 mg/dL (ref 8.4–10.5)
Chloride: 106 meq/L (ref 96–112)
Creatinine, Ser: 0.87 mg/dL (ref 0.40–1.20)
GFR: 64.16 mL/min (ref >60.00)
Glucose, Bld: 84 mg/dL (ref 70–99)
Potassium: 3.8 meq/L (ref 3.5–5.1)
Sodium: 144 meq/L (ref 135–145)

## 2023-07-14 LAB — HEPATIC FUNCTION PANEL
ALT: 10 U/L (ref 0–35)
AST: 20 U/L (ref 0–37)
Albumin: 4.2 g/dL (ref 3.5–5.2)
Alkaline Phosphatase: 62 U/L (ref 39–117)
Bilirubin, Direct: 0 mg/dL (ref 0.0–0.3)
Total Bilirubin: 0.3 mg/dL (ref 0.2–1.2)
Total Protein: 6.9 g/dL (ref 6.0–8.3)

## 2023-07-14 LAB — LIPID PANEL
Cholesterol: 176 mg/dL (ref 0–200)
HDL: 68.1 mg/dL (ref >39.00)
LDL Cholesterol: 82 mg/dL (ref 0–99)
NonHDL: 107.64
Total CHOL/HDL Ratio: 3
Triglycerides: 129 mg/dL (ref 0.0–149.0)
VLDL: 25.8 mg/dL (ref 0.0–40.0)

## 2023-07-14 LAB — VITAMIN D 25 HYDROXY (VIT D DEFICIENCY, FRACTURES): VITD: <7 ng/mL — ABNORMAL LOW (ref 30.00–100.00)

## 2023-07-14 LAB — MAGNESIUM: Magnesium: 1.8 mg/dL (ref 1.5–2.5)

## 2023-07-17 ENCOUNTER — Other Ambulatory Visit: Payer: Self-pay

## 2023-07-17 MED ORDER — VITAMIN D (ERGOCALCIFEROL) 1.25 MG (50000 UNIT) PO CAPS: 50000.0000 [IU] | ORAL_CAPSULE | ORAL | 0 refills | Status: DC

## 2023-07-17 MED ORDER — VITAMIN D (ERGOCALCIFEROL) 1.25 MG (50000 UNIT) PO CAPS: 50000.0000 [IU] | ORAL_CAPSULE | ORAL | 1 refills | Status: DC

## 2023-07-17 NOTE — Addendum Note (Signed)
Addended by: Charm Barges on: 07/17/2023 01:00 PM   Modules accepted: Orders

## 2023-07-17 NOTE — Progress Notes (Signed)
Vit D ordered.

## 2023-07-18 ENCOUNTER — Encounter: Payer: Self-pay | Admitting: Internal Medicine

## 2023-07-18 DIAGNOSIS — Z9109 Other allergy status, other than to drugs and biological substances: Secondary | ICD-10-CM | POA: Insufficient documentation

## 2023-07-18 NOTE — Assessment & Plan Note (Signed)
Stay hydrated.  Check electrolytes and magnesium.

## 2023-07-18 NOTE — Assessment & Plan Note (Signed)
Has had shoulder pain in both shoulders.  Has seen ortho.  Impingement of left shoulder s/p injection.  Helped some. Continue gentle stretches. Continue f/u with ortho.

## 2023-07-18 NOTE — Assessment & Plan Note (Signed)
Stable.  Continue sulfasalazine.  Has been followed by GI ([facility]).  Wanted to hold on colonoscopy.

## 2023-07-18 NOTE — Assessment & Plan Note (Signed)
S/p pacemaker placement 03/2023.  Doing well. Continue f/u with cardiology.

## 2023-07-18 NOTE — Assessment & Plan Note (Signed)
Check vitamin D level with labs today.

## 2023-07-18 NOTE — Assessment & Plan Note (Signed)
Continue lisinopril, amlodipine, hydrochlorothiazide.  Recently added carvedilol - after pacemaker placement. Follow pressures. Follow metabolic panel.

## 2023-07-18 NOTE — Assessment & Plan Note (Signed)
Receiving B12 injections.  °

## 2023-07-18 NOTE — Assessment & Plan Note (Signed)
Stay hydrated.  Check electrolytes, magnesium.  Stretches.

## 2023-07-18 NOTE — Assessment & Plan Note (Signed)
Has had several surgeries on her knee.  Now being followed by pain clinic.

## 2023-07-18 NOTE — Assessment & Plan Note (Signed)
Continue crestor.  Follow lipid panel and liver function tests.  

## 2023-07-18 NOTE — Assessment & Plan Note (Signed)
Saline nasal spray/steroid nasal spray.  Follow.

## 2023-07-19 NOTE — Progress Notes (Unsigned)
PROVIDER NOTE: Information contained herein reflects review and annotations entered in association with encounter. Interpretation of such information and data should be left to medically-trained personnel. Information provided to patient can be located elsewhere in the medical record under "Patient Instructions". Document created using STT-dictation technology, any transcriptional errors that may result from process are unintentional.    Patient: Ellen Drake Ellen Drake  Service Category: E/M  Provider: Oswaldo Done, MD  DOB: [birth year]  DOS: 07/20/2023  Referring Provider: Dale De Witt, MD  MRN: [id number]  Specialty: Interventional Pain Management  PCP: Dale [place], MD  Type: Established Patient  Setting: Ambulatory outpatient    Location: Office  Delivery: Face-to-face     HPI  Ellen Drake, a 77 y.o. year old female, is here today because of her Chronic pain of right knee [M25.561, G89.29]. Ellen Drake primary complain today is No chief complaint on file.  Pertinent problems: Ellen Drake has Arthritis; Chronic knee pain (1ry area of Pain) (Right); S/P revision of total knee (Right); Primary osteoarthritis of right knee; Right leg swelling; Shoulder pain, bilateral; Synovitis of knee; History of unicompartmental knee replacement (Right); Chronic pain syndrome; Chronic knee pain after total replacement of right knee joint; Pain and swelling of knee (Right); Neurogenic pain; Left shoulder pain; Leg cramps; and Chronic pain disorder on their pertinent problem list. Pain Assessment: Severity of   is reported as a  /10. Location:    / . Onset:  . Quality:  . Timing:  . Modifying factor(s):  Marland Kitchen Vitals:  vitals were not taken for this visit.  BMI: Estimated body mass index is 23.4 kg/m as calculated from the following:   Height as of 07/13/23: 5\' 6"  (1.676 m).   Weight as of 07/13/23: 145 lb (65.8 kg). Last encounter: 05/27/2023. Last procedure: Visit date not found.  Reason for  encounter: evaluation of worsening, or previously known (established) problem. ***  Discussed the use of AI scribe software for clinical note transcription with the patient, who gave verbal consent to proceed.  History of Present Illness           Pharmacotherapy Assessment  Analgesic: Tramadol 50 mg 1 tab PO BID (100 mg/Drake of tramadol) (10 MME) MME/Drake: 10 mg/Drake   Monitoring: [place] PMP: PDMP reviewed during this encounter.       Pharmacotherapy: No side-effects or adverse reactions reported. Compliance: No problems identified. Effectiveness: Clinically acceptable.  No notes on file  No results found for: "CBDTHCR" No results found for: "D8THCCBX" No results found for: "D9THCCBX"  UDS:  Summary  Date Value Ref Range Status  05/27/2023 Note  Final    Comment:    ==================================================================== [employer] Select 13 (MW) ==================================================================== Test                             Result       Flag       Units  Drug Present and Declared for Prescription Verification   Tramadol                       >3185        EXPECTED   ng/mg creat   O-Desmethyltramadol            >3185        EXPECTED   ng/mg creat   N-Desmethyltramadol            468  EXPECTED   ng/mg creat    Source of tramadol is a prescription medication. O-desmethyltramadol    and N-desmethyltramadol are expected metabolites of tramadol.  ==================================================================== Test                      Result    Flag   Units      Ref Range   Creatinine              157              mg/dL      >=02 ==================================================================== Declared Medications:  The flagging and interpretation on this report are based on the  following declared medications.  Unexpected results may arise from  inaccuracies in the declared medications.   **Note: The testing scope of this panel  includes these medications:   Tramadol (Ultram)   **Note: The testing scope of this panel does not include the  following reported medications:   Amlodipine (Norvasc)  Aspirin  Carvedilol (Coreg)  Hydrochlorothiazide  Latanoprost (Xalatan)  Magnesium (Mag-Ox)  Potassium (Klor-Con)  Rosuvastatin (Crestor)  Sulfasalazine (Azulfidine)  Timolol (Timoptic)  Triamcinolone (Kenalog) ==================================================================== For clinical consultation, please call [phone]. ====================================================================       ROS  Constitutional: Denies any fever or chills Gastrointestinal: No reported hemesis, hematochezia, vomiting, or acute GI distress Musculoskeletal: Denies any acute onset joint swelling, redness, loss of ROM, or weakness Neurological: No reported episodes of acute onset apraxia, aphasia, dysarthria, agnosia, amnesia, paralysis, loss of coordination, or loss of consciousness  Medication Review  Aspirin, Vitamin D (Ergocalciferol), amLODipine, carvedilol, folic acid, hydrochlorothiazide, latanoprost, magnesium oxide, potassium chloride, rosuvastatin, sulfaSALAzine, timolol, traMADol, and triamcinolone cream  History Review  Allergy: Ellen Drake is allergic to mercaptopurine, lyrica [pregabalin], and dilaudid [hydromorphone hcl]. Drug: Ellen Drake  reports no history of drug use. Alcohol:  reports current alcohol use of about 1.0 standard drink of alcohol per week. Tobacco:  reports that she quit smoking about 13 years ago. Her smoking use included cigarettes. She has never used smokeless tobacco. Social: Ellen Drake  reports that she quit smoking about 13 years ago. Her smoking use included cigarettes. She has never used smokeless tobacco. She reports current alcohol use of about 1.0 standard drink of alcohol per week. She reports that she does not use drugs. Medical:  has a past medical history of Anemia, Colon  polyp (2009), Crohn's disease (HCC), Diverticulosis, Fibrocystic breast disease, Herniated nucleus pulposus of lumbosacral region, History of chicken pox, Hypercholesterolemia, Hyperlipidemia, Hypertension, Osteoarthritis, and Pulmonary nodule seen on imaging study (2004). Surgical: Ms. Babayev  has a past surgical history that includes Back surgery (1992); anterior cervical discectomy and fusion (1998); Knee surgery (Right, 1/16); Colonoscopy (2009); Colon surgery (1998); Posterior laminectomy / decompression lumbar spine (1999); Rotator cuff repair (Left, 2007); Colonoscopy w/ biopsies (11/30/2012); Colectomy (2003); Replacement total knee; Knee arthroscopy w/ synovectomy (Right, 03/10/2014); Colonoscopy w/ biopsies (04/15/2016); Cataract extraction w/PHACO (Right, 02/09/2019); Breast biopsy (Left); and PACEMAKER IMPLANT (N/A, 03/19/2023). Family: family history includes CVA in her mother; Hypertension in her mother and sister.  Laboratory Chemistry Profile   Renal Lab Results  Component Value Date   BUN 19 07/13/2023   CREATININE 0.87 07/13/2023   GFR 64.16 07/13/2023   GFRNONAA 58 (L) 03/20/2023    Hepatic Lab Results  Component Value Date   AST 20 07/13/2023   ALT 10 07/13/2023   ALBUMIN 4.2 07/13/2023   ALKPHOS 62 07/13/2023    Electrolytes Lab Results  Component Value Date   NA 144 07/13/2023   K 3.8 07/13/2023   CL 106 07/13/2023   CALCIUM 10.0 07/13/2023   MG 1.8 07/13/2023    Bone Lab Results  Component Value Date   VD25OH <7.00 (L) 07/13/2023    Inflammation (CRP: Acute Phase) (ESR: Chronic Phase) Lab Results  Component Value Date   CRP 4.9 03/09/2023   ESRSEDRATE 32 (H) 03/09/2023         Note: Above Lab results reviewed.  Recent Imaging Review  CUP PACEART INCLINIC DEVICE CHECK Pacemaker check in clinic. Normal device function. Thresholds, sensing, impedance's consistent with previous measurements. No episodes. Device programmed at appropriate safety margins.  Histogram distribution appropriate for patient activity level.  Estimated longevity 79yr28mo. Patient enrolled in remote follow-up. Patient education completed. Note: Reviewed        Physical Exam  General appearance: Well nourished, well developed, and well hydrated. In no apparent acute distress Mental status: Alert, oriented x 3 (person, place, & time)       Respiratory: No evidence of acute respiratory distress Eyes: PERLA Vitals: LMP 08/13/1995  BMI: Estimated body mass index is 23.4 kg/m as calculated from the following:   Height as of 07/13/23: 5\' 6"  (1.676 m).   Weight as of 07/13/23: 145 lb (65.8 kg). Ideal: Ideal body weight: 59.3 kg (130 lb 11.7 oz) Adjusted ideal body weight: 61.9 kg (136 lb 7 oz)  Assessment   Diagnosis Status  1. Chronic knee pain (1ry area of Pain) (Right)   2. Chronic knee pain after total replacement of right knee joint   3. Pain and swelling of knee (Right)   4. Primary osteoarthritis of right knee    Controlled Controlled Controlled   Updated Problems: No problems updated.  Plan of Care  Problem-specific:  Assessment and Plan            Ms. Makynli Drake Madera has a current medication list which includes the following long-term medication(s): amlodipine, carvedilol, hydrochlorothiazide, klor-con m10, rosuvastatin, sulfasalazine, and tramadol.  Pharmacotherapy (Medications Ordered): No orders of the defined types were placed in this encounter.  Orders:  No orders of the defined types were placed in this encounter.  Follow-up plan:   No follow-ups on file.      Interventional Therapies  Risk Factors  Considerations  Medical Comorbidities:  Allergy: Lyrica, Dilaudid  Note: Does not want SCS  Pacemaker in situ  Crohn's  HTN  Bradycardia  2nd Degree AV Block, Mobitz type II  Vit D & B12 Deficiency    Planned  Pending:   Therapeutic right genicular nerve RFA #2    Under consideration:   Therapeutic right genicular nerve  RFA #2  Diagnostic right Lumbar spinal cord stimulator trial (Does not want.)   Completed:   Diagnostic right genicular NB x1 (01/17/2021) (100/100/0/0)  Therapeutic right genicular nerve RFA x1 (03/07/2021) (100/100/100/90)    Therapeutic  Palliative (PRN) options:   Palliative right genicular nerve RFA    Completed by other providers:   None reported   Pharmacotherapy  Nonopioids transferred 05/08/2021: Gabapentin        Recent Visits Date Type Provider Dept  05/27/23 Office Visit Delano Metz, MD [facility]  Showing recent visits within past 90 days and meeting all other requirements Future Appointments Date Type Provider Dept  07/20/23 Appointment Delano Metz, MD [facility]  Showing future appointments within next 90 days and meeting all other requirements  I discussed the assessment and treatment  plan with the patient. The patient was provided an opportunity to ask questions and all were answered. The patient agreed with the plan and demonstrated an understanding of the instructions.  Patient advised to call back or seek an in-person evaluation if the symptoms or condition worsens.  Duration of encounter: *** minutes.  Total time on encounter, as per AMA guidelines included both the face-to-face and non-face-to-face time personally spent by the physician and/or other qualified health care professional(s) on the Drake of the encounter (includes time in activities that require the physician or other qualified health care professional and does not include time in activities normally performed by clinical staff). Physician's time may include the following activities when performed: Preparing to see the patient (e.g., pre-charting review of records, searching for previously ordered imaging, lab work, and nerve conduction tests) Review of prior analgesic pharmacotherapies. Reviewing PMP Interpreting ordered tests (e.g., lab work, imaging, nerve  conduction tests) Performing post-procedure evaluations, including interpretation of diagnostic procedures Obtaining and/or reviewing separately obtained history Performing a medically appropriate examination and/or evaluation Counseling and educating the patient/family/caregiver Ordering medications, tests, or procedures Referring and communicating with other health care professionals (when not separately reported) Documenting clinical information in the electronic or other health record Independently interpreting results (not separately reported) and communicating results to the patient/ family/caregiver Care coordination (not separately reported)  Note by: Oswaldo Done, MD Date: 07/20/2023; Time: 6:08 PM

## 2023-07-20 ENCOUNTER — Encounter: Payer: Self-pay | Admitting: Pain Medicine

## 2023-07-20 ENCOUNTER — Ambulatory Visit: Payer: Medicare HMO | Attending: Pain Medicine | Admitting: Pain Medicine

## 2023-07-20 VITALS — BP 139/63 | HR 64 | Temp 97.3°F | Ht 66.0 in | Wt 145.0 lb

## 2023-07-20 DIAGNOSIS — M25561 Pain in right knee: Secondary | ICD-10-CM | POA: Diagnosis not present

## 2023-07-20 DIAGNOSIS — M1711 Unilateral primary osteoarthritis, right knee: Secondary | ICD-10-CM | POA: Diagnosis not present

## 2023-07-20 DIAGNOSIS — M25461 Effusion, right knee: Secondary | ICD-10-CM | POA: Diagnosis not present

## 2023-07-20 DIAGNOSIS — G8929 Other chronic pain: Secondary | ICD-10-CM | POA: Insufficient documentation

## 2023-07-20 DIAGNOSIS — Z96651 Presence of right artificial knee joint: Secondary | ICD-10-CM | POA: Insufficient documentation

## 2023-07-20 NOTE — Progress Notes (Signed)
Safety precautions to be maintained throughout the outpatient stay will include: orient to surroundings, keep bed in low position, maintain call bell within reach at all times, provide assistance with transfer out of bed and ambulation.  

## 2023-07-24 ENCOUNTER — Ambulatory Visit (INDEPENDENT_AMBULATORY_CARE_PROVIDER_SITE_OTHER): Payer: Medicare HMO | Admitting: *Deleted

## 2023-07-24 VITALS — Ht 66.0 in | Wt 142.0 lb

## 2023-07-24 DIAGNOSIS — Z0000 Encounter for general adult medical examination without abnormal findings: Secondary | ICD-10-CM | POA: Diagnosis not present

## 2023-07-24 NOTE — Patient Instructions (Signed)
Ellen Drake , Thank you for taking time to come for your Medicare Wellness Visit. I appreciate your ongoing commitment to your health goals. Please review the following plan we discussed and let me know if I can assist you in the future.   Referrals/Orders/Follow-Ups/Clinician Recommendations: Consider updating your shingles vaccines  This is a list of the screening recommended for you and due dates:  Health Maintenance  Topic Date Due   Zoster (Shingles) Vaccine (1 of 2) 08/13/2023*   COVID-19 Vaccine (5 - 2024-25 season) 05/12/2024*   Mammogram  01/06/2024   Medicare Annual Wellness Visit  07/23/2024   DTaP/Tdap/Td vaccine (2 - Td or Tdap) 08/20/2032   Pneumonia Vaccine  Completed   Flu Shot  Completed   DEXA scan (bone density measurement)  Completed   Hepatitis C Screening  Completed   HPV Vaccine  Aged Out   Colon Cancer Screening  Discontinued  *Topic was postponed. The date shown is not the original due date.    Advanced directives: (Declined) Advance directive discussed with you today. Even though you declined this today, please call our office should you change your mind, and we can give you the proper paperwork for you to fill out.  Next Medicare Annual Wellness Visit scheduled for next year: Yes 07/26/24 @ 10:10    Managing Pain Without Opioids Opioids are strong medicines used to treat moderate to severe pain. For some people, especially those who have long-term (chronic) pain, opioids may not be the best choice for pain management due to: Side effects like nausea, constipation, and sleepiness. The risk of addiction (opioid use disorder). The longer you take opioids, the greater your risk of addiction. Pain that lasts for more than 3 months is called chronic pain. Managing chronic pain usually requires more than one approach and is often provided by a team of health care providers working together (multidisciplinary approach). Pain management may be done at a pain management  center or pain clinic. How to manage pain without the use of opioids Use non-opioid medicines Non-opioid medicines for pain may include: Over-the-counter or prescription non-steroidal anti-inflammatory drugs (NSAIDs). These may be the first medicines used for pain. They work well for muscle and bone pain, and they reduce swelling. Acetaminophen. This over-the-counter medicine may work well for milder pain but not swelling. Antidepressants. These may be used to treat chronic pain. A certain type of antidepressant (tricyclics) is often used. These medicines are given in lower doses for pain than when used for depression. Anticonvulsants. These are usually used to treat seizures but may also reduce nerve (neuropathic) pain. Muscle relaxants. These relieve pain caused by sudden muscle tightening (spasms). You may also use a pain medicine that is applied to the skin as a patch, cream, or gel (topical analgesic), such as a numbing medicine. These may cause fewer side effects than medicines taken by mouth. Do certain therapies as directed Some therapies can help with pain management. They include: Physical therapy. You will do exercises to gain strength and flexibility. A physical therapist may teach you exercises to move and stretch parts of your body that are weak, stiff, or painful. You can learn these exercises at physical therapy visits and practice them at home. Physical therapy may also involve: Massage. Heat wraps or applying heat or cold to affected areas. Electrical signals that interrupt pain signals (transcutaneous electrical nerve stimulation, TENS). Weak lasers that reduce pain and swelling (low-level laser therapy). Signals from your body that help you learn to regulate pain (  biofeedback). Occupational therapy. This helps you to learn ways to function at home and work with less pain. Recreational therapy. This involves trying new activities or hobbies, such as a physical activity or  drawing. Mental health therapy, including: Cognitive behavioral therapy (CBT). This helps you learn coping skills for dealing with pain. Acceptance and commitment therapy (ACT) to change the way you think and react to pain. Relaxation therapies, including muscle relaxation exercises and mindfulness-based stress reduction. Pain management counseling. This may be individual, family, or group counseling.  Receive medical treatments Medical treatments for pain management include: Nerve block injections. These may include a pain blocker and anti-inflammatory medicines. You may have injections: Near the spine to relieve chronic back or neck pain. Into joints to relieve back or joint pain. Into nerve areas that supply a painful area to relieve body pain. Into muscles (trigger point injections) to relieve some painful muscle conditions. A medical device placed near your spine to help block pain signals and relieve nerve pain or chronic back pain (spinal cord stimulation device). Acupuncture. Follow these instructions at home Medicines Take over-the-counter and prescription medicines only as told by your health care provider. If you are taking pain medicine, ask your health care providers about possible side effects to watch out for. Do not drive or use heavy machinery while taking prescription opioid pain medicine. Lifestyle  Do not use drugs or alcohol to reduce pain. If you drink alcohol, limit how much you have to: 0-1 drink a day for women who are not pregnant. 0-2 drinks a day for men. Know how much alcohol is in a drink. In the [place] one drink equals one 12 oz bottle of beer (355 mL), one 5 oz glass of wine (148 mL), or one 1 oz glass of hard liquor (44 mL). Do not use any products that contain nicotine or tobacco. These products include cigarettes, chewing tobacco, and vaping devices, such as e-cigarettes. If you need help quitting, ask your health care provider. Eat a healthy diet and  maintain a healthy weight. Poor diet and excess weight may make pain worse. Eat foods that are high in fiber. These include fresh fruits and vegetables, whole grains, and beans. Limit foods that are high in fat and processed sugars, such as fried and sweet foods. Exercise regularly. Exercise lowers stress and may help relieve pain. Ask your health care provider what activities and exercises are safe for you. If your health care provider approves, join an exercise class that combines movement and stress reduction. Examples include yoga and tai chi. Get enough sleep. Lack of sleep may make pain worse. Lower stress as much as possible. Practice stress reduction techniques as told by your therapist. General instructions Work with all your pain management providers to find the treatments that work best for you. You are an important member of your pain management team. There are many things you can do to reduce pain on your own. Consider joining an online or in-person support group for people who have chronic pain. Keep all follow-up visits. This is important. Where to find more information You can find more information about managing pain without opioids from: American Academy of Pain Medicine: painmed.org Institute for Chronic Pain: instituteforchronicpain.org American Chronic Pain Association: theacpa.org Contact a health care provider if: You have side effects from pain medicine. Your pain gets worse or does not get better with treatments or home therapy. You are struggling with anxiety or depression. Summary Many types of pain can be managed without  opioids. Chronic pain may respond better to pain management without opioids. Pain is best managed when you and a team of health care providers work together. Pain management without opioids may include non-opioid medicines, medical treatments, physical therapy, mental health therapy, and lifestyle changes. Tell your health care providers if your  pain gets worse or is not being managed well enough. This information is not intended to replace advice given to you by your health care provider. Make sure you discuss any questions you have with your health care provider. Document Revised: 11/07/2020 Document Reviewed: 11/07/2020 Elsevier Patient Education  2024 [employer].

## 2023-07-24 NOTE — Progress Notes (Signed)
Subjective:   Ellen Drake is a 77 y.o. female who presents for Medicare Annual (Subsequent) preventive examination.  Visit Complete: Virtual I connected with  Ellen Drake on 07/24/23 by a audio enabled telemedicine application and verified that I am speaking with the correct person using two identifiers.  Patient Location: Home  Provider Location: Home Office  I discussed the limitations of evaluation and management by telemedicine. The patient expressed understanding and agreed to proceed.  Vital Signs: Because this visit was a virtual/telehealth visit, some criteria may be missing or patient reported. Any vitals not documented were not able to be obtained and vitals that have been documented are patient reported.   Cardiac Risk Factors include: advanced age (>52men, >51 women);dyslipidemia;hypertension;Other (see comment), Risk factor comments: Pacemaker     Objective:    Today's Vitals   07/24/23 1035 07/24/23 1036  Weight: 142 lb (64.4 kg)   Height: 5\' 6"  (1.676 m)   PainSc:  0-No pain   Body mass index is 22.92 kg/m.     07/24/2023   10:49 AM 05/27/2023   11:39 AM 03/17/2023    6:00 AM 11/03/2022    9:44 AM 09/08/2022    8:27 AM 08/20/2022    2:27 PM 07/18/2022   12:27 PM  Advanced Directives  Does Patient Have a Medical Advance Directive? No No No No No No No  Would patient like information on creating a medical advance directive? No - Patient declined No - Patient declined No - Patient declined No - Patient declined No - Patient declined  No - Patient declined    Current Medications (verified) Outpatient Encounter Medications as of 07/24/2023  Medication Sig   amLODipine (NORVASC) 10 MG tablet TAKE 1 TABLET EVERY DAY   ASPIRIN 81 PO Take 1 tablet by mouth daily.   carvedilol (COREG) 12.5 MG tablet Take 1 tablet (12.5 mg total) by mouth 2 (two) times daily.   folic acid (FOLVITE) 1 MG tablet Take 1 mg by mouth daily.   hydrochlorothiazide (MICROZIDE)  12.5 MG capsule Take 1 capsule (12.5 mg total) by mouth daily.   latanoprost (XALATAN) 0.005 % ophthalmic solution Place 1 drop into both eyes at bedtime.    magnesium oxide (MAG-OX) 400 (240 Mg) MG tablet Take 1 tablet (400 mg total) by mouth daily.   potassium chloride (KLOR-CON M10) 10 MEQ tablet TAKE 2 TABLETS BY MOUTH 2 TIMES DAILY.   rosuvastatin (CRESTOR) 10 MG tablet TAKE 1 TABLET EVERY DAY   sulfaSALAzine (AZULFIDINE) 500 MG tablet Take 1,500 mg by mouth 2 (two) times daily. 3 tabs   timolol (TIMOPTIC) 0.5 % ophthalmic solution Place 1 drop into both eyes 2 (two) times daily.   traMADol (ULTRAM) 50 MG tablet Take 1 tablet (50 mg total) by mouth 2 (two) times daily. Each refill must last 30 days.   triamcinolone cream (KENALOG) 0.1 % Apply 1 Application topically as needed (prn). As needed   Vitamin D, Ergocalciferol, (DRISDOL) 1.25 MG (50000 UNIT) CAPS capsule Take 1 capsule (50,000 Units total) by mouth every 7 (seven) days.   No facility-administered encounter medications on file as of 07/24/2023.    Allergies (verified) Mercaptopurine, Lyrica [pregabalin], and Dilaudid [hydromorphone hcl]   History: Past Medical History:  Diagnosis Date   Anemia    reslved   Colon polyp 2009   Crohn's disease (HCC)    Diverticulosis    Fibrocystic breast disease    Herniated nucleus pulposus of lumbosacral region    History  of chicken pox    Hypercholesterolemia    Hyperlipidemia    Hypertension    Osteoarthritis    Pulmonary nodule seen on imaging study 2004   resolved 2007   Past Surgical History:  Procedure Laterality Date   anterior cervical discectomy and fusion  1998   Dr Alvy Beal SURGERY  1992   ruptured disc (Dr Gerrit Heck)   BREAST BIOPSY Left    neg-no scar seen   CATARACT EXTRACTION W/PHACO Right 02/09/2019   Procedure: CATARACT EXTRACTION PHACO AND INTRAOCULAR LENS PLACEMENT (IOC) KAHOOK DUAL BLADE GONIOTOMY  RIGHT HEALON 5, VISION BLUE;  Surgeon: Lockie Mola, MD;  Location: [facility];  Service: Ophthalmology;  Laterality: Right;   COLECTOMY  2003   COLON SURGERY  1998   COLONOSCOPY  2009   COLONOSCOPY W/ BIOPSIES  11/30/2012   Procedure: COLONOSCOPY W/BIOPSY; Surgeon: Benjie Karvonen, MD; Location: [facility]; Service: Gastroenterology;;   COLONOSCOPY W/ BIOPSIES  04/15/2016   Procedure: Colonoscopy ; Surgeon: Benjie Karvonen, MD; Location: [facility]; Service: Gastroenterology; Laterality: N/A;   KNEE ARTHROSCOPY W/ SYNOVECTOMY Right 03/10/2014   limited   KNEE SURGERY Right 1/16   [facility] (Dr. Ophelia Shoulder)   PACEMAKER IMPLANT N/A 03/19/2023   Procedure: PACEMAKER IMPLANT;  Surgeon: Regan Lemming, MD;  Location: [facility];  Service: Cardiovascular;  Laterality: N/A;   POSTERIOR LAMINECTOMY / DECOMPRESSION LUMBAR SPINE  1999   REPLACEMENT TOTAL KNEE     ROTATOR CUFF REPAIR Left 2007   acute open; accident   Family History  Problem Relation Age of Onset   CVA Mother    Hypertension Mother    Hypertension Sister        brain tumor   Breast cancer Neg Hx    Colon cancer Neg Hx    Social History   Socioeconomic History   Marital status: Married    Spouse name: Not on file   Number of children: 0   Years of education: 12   Highest education level: Not on file  Occupational History   Not on file  Tobacco Use   Smoking status: Former    Current packs/day: 0.00    Types: Cigarettes    Quit date: 09/10/2009    Years since quitting: 13.8   Smokeless tobacco: Never  Vaping Use   Vaping status: Never Used  Substance and Sexual Activity   Alcohol use: Yes    Alcohol/week: 1.0 standard drink of alcohol    Types: 1 Glasses of wine per week    Comment: wine occasional; socially   Drug use: No   Sexual activity: Yes  Other Topics Concern   Not on file  Social History Narrative   married   Social Drivers of Corporate investment banker Strain: Low Risk  (07/24/2023)    Overall Financial Resource Strain (CARDIA)    Difficulty of Paying Living Expenses: Not hard at all  Food Insecurity: No Food Insecurity (07/24/2023)   Hunger Vital Sign    Worried About Running Out of Food in the Last Year: Never true    Ran Out of Food in the Last Year: Never true  Transportation Needs: No Transportation Needs (07/24/2023)   PRAPARE - Administrator, Civil Service (Medical): No    Lack of Transportation (Non-Medical): No  Physical Activity: Inactive (07/24/2023)   Exercise Vital Sign    Days of Exercise per Week: 0 days  Minutes of Exercise per Session: 0 min  Stress: No Stress Concern Present (07/24/2023)   [employer] of Occupational Health - Occupational Stress Questionnaire    Feeling of Stress : Not at all  Social Connections: Socially Integrated (07/24/2023)   Social Connection and Isolation Panel [NHANES]    Frequency of Communication with Friends and Family: More than three times a week    Frequency of Social Gatherings with Friends and Family: Twice a week    Attends Religious Services: More than 4 times per year    Active Member of [employer] or Organizations: Yes    Attends Engineer, structural: More than 4 times per year    Marital Status: Married    Tobacco Counseling Counseling given: Not Answered   Clinical Intake:  Pre-visit preparation completed: Yes  Pain : No/denies pain Pain Score: 0-No pain     BMI - recorded: 22.92 Nutritional Status: BMI of 19-24  Normal Nutritional Risks: None Diabetes: No  How often do you need to have someone help you when you read instructions, pamphlets, or other written materials from your doctor or pharmacy?: 1 - Never  Interpreter Needed?: No  Information entered by :: R. Reinhard Schack LPN   Activities of Daily Living    07/24/2023   10:38 AM 03/17/2023    6:00 AM  In your present state of health, do you have any difficulty performing the following activities:  Hearing? 0 0   Vision? 0 0  Comment glasses   Difficulty concentrating or making decisions? 0 0  Walking or climbing stairs? 1 0  Comment problem with knee   Dressing or bathing? 0 0  Doing errands, shopping? 0 0  Preparing Food and eating ? N   Using the Toilet? N   In the past six months, have you accidently leaked urine? N   Do you have problems with loss of bowel control? N   Managing your Medications? N   Managing your Finances? N   Housekeeping or managing your Housekeeping? N     Patient Care Team: Dale Dodge, MD as PCP - General (Internal Medicine) Jodelle Red, MD as PCP - Cardiology (Cardiology) Regan Lemming, MD as PCP - Electrophysiology (Cardiology) Otho Ket, RN as [facility] Care Management  Indicate any recent Medical Services you may have received from other than [facility] providers in the past year (date may be approximate).     Assessment:   This is a routine wellness examination for Ellen Drake.  Hearing/Vision screen Hearing Screening - Comments:: No issues Vision Screening - Comments:: glasses   Goals Addressed             This Visit's Progress    Patient Stated       Wants to eat the right foods and watch diet       Depression Screen    07/24/2023   10:44 AM 05/13/2023   11:31 AM 11/03/2022    9:44 AM 09/08/2022    8:27 AM 08/26/2022    9:51 AM 07/18/2022   12:26 PM 05/13/2022    2:00 PM  PHQ 2/9 Scores  PHQ - 2 Score 0 0 0 0 0 0 0  PHQ- 9 Score 0 0         Fall Risk    07/24/2023   10:40 AM 07/20/2023    2:50 PM 05/27/2023   11:39 AM 05/13/2023   11:31 AM 11/03/2022    9:44 AM  Fall Risk   Falls  in the past year? 1 0 0 1 0  Number falls in past yr: 0   0   Injury with Fall? 1   1   Risk for fall due to : History of fall(s);Impaired balance/gait      Follow up Falls prevention discussed;Falls evaluation completed        MEDICARE RISK AT HOME: Medicare Risk at Home Any stairs in or around the home?: No If  so, are there any without handrails?: No Home free of loose throw rugs in walkways, pet beds, electrical cords, etc?: Yes Adequate lighting in your home to reduce risk of falls?: Yes Life alert?: No Use of a cane, walker or w/c?: No Grab bars in the bathroom?: Yes Shower chair or bench in shower?: No Elevated toilet seat or a handicapped toilet?: Yes   Cognitive Function:    07/21/2016   10:42 AM 07/20/2015   10:49 AM  MMSE - Mini Mental State Exam  Orientation to time 5 5  Orientation to Place 5 5  Registration 3 3  Attention/ Calculation 5 5  Recall 2 3  Recall-comments 2 out of 3 recalled   Language- name 2 objects 2 2  Language- repeat 1 1  Language- follow 3 step command 3 3  Language- read & follow direction 1 1  Write a sentence 1 1  Copy design 1 1  Total score 29 30        07/24/2023   10:49 AM 07/18/2022   12:28 PM 07/16/2020   11:27 AM 07/14/2019   12:01 PM  6CIT Screen  What Year? 0 points 0 points 0 points 0 points  What month? 0 points 0 points 0 points 0 points  What time? 0 points 0 points 0 points 0 points  Count back from 20 0 points 0 points 0 points 0 points  Months in reverse 2 points 0 points 0 points 0 points  Repeat phrase 0 points 0 points    Total Score 2 points 0 points      Immunizations Immunization History  Administered Date(s) Administered   Fluad Quad(high Dose 65+) 07/26/2019, 06/20/2020, 05/29/2021, 04/23/2022   Fluad Trivalent(High Dose 65+) 07/13/2023   Influenza Split 05/13/2011, 08/12/2012, 04/29/2014   Influenza, High Dose Seasonal PF 07/14/2016, 07/15/2017, 04/29/2018   Influenza, Seasonal, Injecte, Preservative Fre 06/05/2014   Influenza,inj,Quad PF,6+ Mos 05/06/2013, 06/08/2015   Moderna Sars-Covid-2 Vaccination 02/26/2021   PFIZER(Purple Top)SARS-COV-2 Vaccination 09/21/2019, 10/12/2019, 06/06/2020   Pneumococcal Conjugate-13 07/21/2016   Pneumococcal Polysaccharide-23 09/01/2014   Tdap 08/20/2022    TDAP status: Up  to date  Flu Vaccine status: Up to date  Pneumococcal vaccine status: Up to date  Covid-19 vaccine status: Information provided on how to obtain vaccines.   Qualifies for Shingles Vaccine? Yes   Zostavax completed No   Shingrix Completed?: No.    Education has been provided regarding the importance of this vaccine. Patient has been advised to call insurance company to determine out of pocket expense if they have not yet received this vaccine. Advised may also receive vaccine at local pharmacy or Health Dept. Verbalized acceptance and understanding.  Screening Tests Health Maintenance  Topic Date Due   Medicare Annual Wellness (AWV)  07/19/2023   Zoster Vaccines- Shingrix (1 of 2) 08/13/2023 (Originally 11/18/1964)   COVID-19 Vaccine (5 - 2024-25 season) 05/12/2024 (Originally 04/12/2023)   MAMMOGRAM  01/06/2024   DTaP/Tdap/Td (2 - Td or Tdap) 08/20/2032   Pneumonia Vaccine 43+ Years old  Completed  INFLUENZA VACCINE  Completed   DEXA SCAN  Completed   Hepatitis C Screening  Completed   HPV VACCINES  Aged Out   Colonoscopy  Discontinued    Health Maintenance  Health Maintenance Due  Topic Date Due   Medicare Annual Wellness (AWV)  07/19/2023    Colorectal cancer screening: No longer required.   Mammogram status: Completed 10/2022. Repeat every year   Bone Density Status completed 06/2014 Patient declines  Lung Cancer Screening: (Low Dose CT Chest recommended if Age 41-80 years, 20 pack-year currently smoking OR have quit w/in 15years.) does not qualify.   Additional Screening:  Hepatitis C Screening: does qualify; Completed 08/2022  Vision Screening: Recommended annual ophthalmology exams for early detection of glaucoma and other disorders of the eye. Is the patient up to date with their annual eye exam?  Yes  Who is the provider or what is the name of the office in which the patient attends annual eye exams? [facility]  If pt is not established with a provider, would  they like to be referred to a provider to establish care? No .   Dental Screening: Recommended annual dental exams for proper oral hygiene    Community Resource Referral / Chronic Care Management: CRR required this visit?  No   CCM required this visit?  No     Plan:     I have personally reviewed and noted the following in the patient's chart:   Medical and social history Use of alcohol, tobacco or illicit drugs  Current medications and supplements including opioid prescriptions. Patient is currently taking opioid prescriptions. Information provided to patient regarding non-opioid alternatives. Patient advised to discuss non-opioid treatment plan with their provider. Functional ability and status Nutritional status Physical activity Advanced directives List of other physicians Hospitalizations, surgeries, and ER visits in previous 12 months Vitals Screenings to include cognitive, depression, and falls Referrals and appointments  In addition, I have reviewed and discussed with patient certain preventive protocols, quality metrics, and best practice recommendations. A written personalized care plan for preventive services as well as general preventive health recommendations were provided to patient.     Sydell Axon, LPN   [id number]   After Visit Summary: (Pick Up) Due to this being a telephonic visit, with patients personalized plan was offered to patient and patient has requested to Pick up at office.  Nurse Notes: None

## 2023-07-27 ENCOUNTER — Other Ambulatory Visit: Payer: Self-pay | Admitting: Internal Medicine

## 2023-08-13 ENCOUNTER — Telehealth: Payer: Self-pay

## 2023-08-13 NOTE — Telephone Encounter (Signed)
 Copied from CRM [id number]. Topic: General - Other >> Aug 13, 2023 11:46 AM Corin V wrote: Reason for CRM: Patient called in with updated insurance information and stated they needed to switch their mail order pharmacy on file. Please update patient mail order pharmacy to: [facility].

## 2023-08-13 NOTE — Telephone Encounter (Signed)
 Pharmacy updated.

## 2023-08-17 ENCOUNTER — Other Ambulatory Visit: Payer: Self-pay | Admitting: Internal Medicine

## 2023-08-19 DIAGNOSIS — M7542 Impingement syndrome of left shoulder: Secondary | ICD-10-CM | POA: Diagnosis not present

## 2023-08-19 DIAGNOSIS — M25512 Pain in left shoulder: Secondary | ICD-10-CM | POA: Diagnosis not present

## 2023-08-27 ENCOUNTER — Ambulatory Visit: Payer: Self-pay

## 2023-08-27 NOTE — Patient Outreach (Signed)
  Care Coordination   Follow Up Visit Note   08/27/2023 Name: Ellen Drake MRN: [id number] DOB: 1946-05-03  Ellen Drake Ruhland is a 78 y.o. year old female who sees Dale [place], MD for primary care. I spoke with  Ellen Drake Ellen Drake by phone today.  What matters to the patients health and wellness today?  Patient states she received a cortisone shot in her shoulder which has helped some with the pain.   She states pacemaker insertion scar is more tender than normal and has slight redness. Patient states she has a contact phone number to call if she has any issues with her pacemaker.  She reports her next follow up with the cardiologist is in 6 months and primary provider follow up in 3/ 2025.  Patient denies any further needs and is agreeable that care coordination goals have been met.    Goals Addressed             This Visit's Progress    COMPLETED: Continued improvement post surgical procedure.       Interventions Today    Flowsheet Row Most Recent Value  Chronic Disease   Chronic disease during today's visit Other  [s/p pacemaker implant/ pain at sight,  left shoulder pain]  General Interventions   General Interventions Discussed/Reviewed General Interventions Reviewed, Doctor Visits  [evaluation of current treatment plans for listed health conditions and patients adherence to plan as established by providers. Assesessed for new/ ongoing symptoms.]  Doctor Visits Discussed/Reviewed Doctor Visits Reviewed  Annabell Sabal upcoming provider visits. Advised to follow up with providers as recommended.  Advised to notify provider of pacemaker site symptoms]  Education Interventions   Education Provided Provided Education  [Advised to notify provider for any new or ongoing symptoms/ increased shoulder pain. Advised to contact provider if care coordinations services are needed in the future.]  Pharmacy Interventions   Pharmacy Dicussed/Reviewed Pharmacy Topics Reviewed  [medications reviewed  and compliance discussed and advised.]              SDOH assessments and interventions completed:  No     Care Coordination Interventions:  Yes, provided   Follow up plan: No further intervention required.   Encounter Outcome:  Patient Visit Completed   [name] RN,BSN,CCM [facility]  [facility], [facility] coordinator / Case Manager Phone: [phone]

## 2023-08-27 NOTE — Patient Instructions (Signed)
Visit Information  Thank you for taking time to visit with me today. Your care coordination goals have been met.   What we discussed today:   Goals Addressed             This Visit's Progress    COMPLETED: Continued improvement post surgical procedure.       Interventions Today    Flowsheet Row Most Recent Value  Chronic Disease   Chronic disease during today's visit Other  [s/p pacemaker implant/ pain at sight,  left shoulder pain]  General Interventions   General Interventions Discussed/Reviewed General Interventions Reviewed, Doctor Visits  [evaluation of current treatment plans for listed health conditions and patients adherence to plan as established by providers. Assesessed for new/ ongoing symptoms.]  Doctor Visits Discussed/Reviewed Doctor Visits Reviewed  Annabell Sabal upcoming provider visits. Advised to follow up with providers as recommended.  Advised to notify provider of pacemaker site symptoms]  Education Interventions   Education Provided Provided Education  [Advised to notify provider for any new or ongoing symptoms/ increased shoulder pain. Advised to contact provider if care coordinations services are needed in the future.]  Pharmacy Interventions   Pharmacy Dicussed/Reviewed Pharmacy Topics Reviewed  [medications reviewed and compliance discussed and advised.]              Please contact your primary care provider if care coordination/ case management services are needed in the future. .   If you are experiencing a Mental Health or Behavioral Health Crisis or need someone to talk to, please call the Suicide and Crisis Lifeline: 988 call 1-800-273-TALK (toll free, 24 hour hotline)  The patient verbalized understanding of instructions, educational materials, and care plan provided today and DECLINED offer to receive copy of patient instructions, educational materials, and care plan.   [name] RN,BSN,CCM [facility]  [facility], [facility] coordinator / Case Manager Phone: [phone]

## 2023-08-31 ENCOUNTER — Encounter: Payer: Self-pay | Admitting: Internal Medicine

## 2023-08-31 ENCOUNTER — Ambulatory Visit
Admission: RE | Admit: 2023-08-31 | Discharge: 2023-08-31 | Disposition: A | Discharge status: Home / Self Care | Payer: HMO | Attending: Internal Medicine | Admitting: Internal Medicine

## 2023-08-31 ENCOUNTER — Ambulatory Visit (INDEPENDENT_AMBULATORY_CARE_PROVIDER_SITE_OTHER): Payer: HMO | Admitting: Internal Medicine

## 2023-08-31 ENCOUNTER — Ambulatory Visit
Admission: RE | Admit: 2023-08-31 | Discharge: 2023-08-31 | Disposition: A | Discharge status: Home / Self Care | Payer: HMO | Source: Ambulatory Visit | Attending: Internal Medicine | Admitting: Internal Medicine

## 2023-08-31 VITALS — BP 138/76 | HR 81 | Temp 98.0°F | Resp 16 | Ht 66.0 in | Wt 147.0 lb

## 2023-08-31 DIAGNOSIS — Z950 Presence of cardiac pacemaker: Secondary | ICD-10-CM | POA: Diagnosis not present

## 2023-08-31 DIAGNOSIS — R0989 Other specified symptoms and signs involving the circulatory and respiratory systems: Secondary | ICD-10-CM | POA: Diagnosis not present

## 2023-08-31 DIAGNOSIS — K5010 Crohn's disease of large intestine without complications: Secondary | ICD-10-CM

## 2023-08-31 DIAGNOSIS — R051 Acute cough: Secondary | ICD-10-CM

## 2023-08-31 DIAGNOSIS — I700 Atherosclerosis of aorta: Secondary | ICD-10-CM | POA: Diagnosis not present

## 2023-08-31 DIAGNOSIS — R059 Cough, unspecified: Secondary | ICD-10-CM | POA: Diagnosis not present

## 2023-08-31 DIAGNOSIS — I10 Essential (primary) hypertension: Secondary | ICD-10-CM

## 2023-08-31 MED ORDER — AMOXICILLIN-POT CLAVULANATE 875-125 MG PO TABS: 1.0000 | ORAL_TABLET | Freq: Two times a day (BID) | ORAL | 0 refills | Status: DC

## 2023-08-31 MED ORDER — PREDNISONE 10 MG PO TABS: ORAL_TABLET | ORAL | 0 refills | Status: DC

## 2023-08-31 NOTE — Assessment & Plan Note (Signed)
Stable.  Continue sulfasalazine.  Has been followed by GI ([facility]).

## 2023-08-31 NOTE — Assessment & Plan Note (Signed)
Increased cough and congestion as outlined.  Coughing fits. Exam as outlined.  Check cxr. Continue saline nasal spray and steroid nasal spray. Prednisone taper as directed. Augmentin 875mg  bid.  Will check xray to confirm no pneumonia.  Follow.  Call with update.

## 2023-08-31 NOTE — Addendum Note (Signed)
Addended by: Rita Ohara D on: 08/31/2023 01:37 PM   Modules accepted: Orders

## 2023-08-31 NOTE — Patient Instructions (Addendum)
Saline nasal spray - flush nose 1-2x/day  Nasacort nasal spray - 2 sprays each nostril one time per day.  Do this in the evening.   Take a probiotic daily (or eat yogurt) while you are on antibiotics and for two weeks after completing the antibiotic.   Take the antibiotic and prednisone as directed.

## 2023-08-31 NOTE — Progress Notes (Signed)
Subjective:    Patient ID: Ellen Drake, female    DOB: 01/11/1946, 78 y.o.   MRN: [id number]  Patient here for  Chief Complaint  Patient presents with   Cough        Nasal Congestion    HPI Here for work in appt - work in with concerns regarding cough and congestion. States symptoms started Wednesday 08/26/23. Runny nose and nasal congestion. Increased drainage. Previous sore throat. Throat is some better now.  Increased congestion and cough. Cough productive - mostly white / mucus. No vomiting or diarrhea. Does have coughing fits. No sob. No documented fever. Felt hot and cold yesterday.   Past Medical History:  Diagnosis Date   Anemia    reslved   Colon polyp 2009   Crohn's disease (HCC)    Diverticulosis    Fibrocystic breast disease    Herniated nucleus pulposus of lumbosacral region    History of chicken pox    Hypercholesterolemia    Hyperlipidemia    Hypertension    Osteoarthritis    Pulmonary nodule seen on imaging study 2004   resolved 2007   Past Surgical History:  Procedure Laterality Date   anterior cervical discectomy and fusion  1998   Dr Alvy Beal SURGERY  1992   ruptured disc (Dr Gerrit Heck)   BREAST BIOPSY Left    neg-no scar seen   CATARACT EXTRACTION W/PHACO Right 02/09/2019   Procedure: CATARACT EXTRACTION PHACO AND INTRAOCULAR LENS PLACEMENT (IOC) KAHOOK DUAL BLADE GONIOTOMY  RIGHT HEALON 5, VISION BLUE;  Surgeon: Lockie Mola, MD;  Location: [facility];  Service: Ophthalmology;  Laterality: Right;   COLECTOMY  2003   COLON SURGERY  1998   COLONOSCOPY  2009   COLONOSCOPY W/ BIOPSIES  11/30/2012   Procedure: COLONOSCOPY W/BIOPSY; Surgeon: Benjie Karvonen, MD; Location: [facility]; Service: Gastroenterology;;   COLONOSCOPY W/ BIOPSIES  04/15/2016   Procedure: Colonoscopy ; Surgeon: Benjie Karvonen, MD; Location: [facility]; Service: Gastroenterology; Laterality: N/A;   KNEE ARTHROSCOPY W/  SYNOVECTOMY Right 03/10/2014   limited   KNEE SURGERY Right 1/16   [facility] (Dr. Ophelia Shoulder)   PACEMAKER IMPLANT N/A 03/19/2023   Procedure: PACEMAKER IMPLANT;  Surgeon: Regan Lemming, MD;  Location: [facility];  Service: Cardiovascular;  Laterality: N/A;   POSTERIOR LAMINECTOMY / DECOMPRESSION LUMBAR SPINE  1999   REPLACEMENT TOTAL KNEE     ROTATOR CUFF REPAIR Left 2007   acute open; accident   Family History  Problem Relation Age of Onset   CVA Mother    Hypertension Mother    Hypertension Sister        brain tumor   Breast cancer Neg Hx    Colon cancer Neg Hx    Social History   Socioeconomic History   Marital status: Married    Spouse name: Not on file   Number of children: 0   Years of education: 12   Highest education level: Not on file  Occupational History   Not on file  Tobacco Use   Smoking status: Former    Current packs/day: 0.00    Types: Cigarettes    Quit date: 09/10/2009    Years since quitting: 13.9   Smokeless tobacco: Never  Vaping Use   Vaping status: Never Used  Substance and Sexual Activity   Alcohol use: Yes    Alcohol/week: 1.0 standard drink of alcohol    Types: 1 Glasses of wine  per week    Comment: wine occasional; socially   Drug use: No   Sexual activity: Yes  Other Topics Concern   Not on file  Social History Narrative   married   Social Drivers of Corporate investment banker Strain: Low Risk  (07/24/2023)   Overall Financial Resource Strain (CARDIA)    Difficulty of Paying Living Expenses: Not hard at all  Food Insecurity: No Food Insecurity (07/24/2023)   Hunger Vital Sign    Worried About Running Out of Food in the Last Year: Never true    Ran Out of Food in the Last Year: Never true  Transportation Needs: No Transportation Needs (07/24/2023)   PRAPARE - Administrator, Civil Service (Medical): No    Lack of Transportation (Non-Medical): No  Physical Activity: Inactive (07/24/2023)   Exercise  Vital Sign    Days of Exercise per Week: 0 days    Minutes of Exercise per Session: 0 min  Stress: No Stress Concern Present (07/24/2023)   [employer] of Occupational Health - Occupational Stress Questionnaire    Feeling of Stress : Not at all  Social Connections: Socially Integrated (07/24/2023)   Social Connection and Isolation Panel [NHANES]    Frequency of Communication with Friends and Family: More than three times a week    Frequency of Social Gatherings with Friends and Family: Twice a week    Attends Religious Services: More than 4 times per year    Active Member of [employer] or Organizations: Yes    Attends Engineer, structural: More than 4 times per year    Marital Status: Married     Review of Systems  Constitutional:  Negative for appetite change and unexpected weight change.  HENT:  Positive for congestion. Negative for sinus pressure.        Sore throat improved.   Respiratory:  Positive for cough. Negative for chest tightness and shortness of breath.   Cardiovascular:  Negative for chest pain and palpitations.  Gastrointestinal:  Negative for abdominal pain, diarrhea, nausea and vomiting.  Genitourinary:  Negative for difficulty urinating and dysuria.  Musculoskeletal:  Negative for myalgias.       Chronic knee pain and swelling  Skin:  Negative for color change and rash.  Neurological:  Negative for dizziness and headaches.  Psychiatric/Behavioral:  Negative for agitation and dysphoric mood.        Objective:     BP 138/76   Pulse 81   Temp 98 F (36.7 C)   Resp 16   Ht 5\' 6"  (1.676 m)   Wt 147 lb (66.7 kg)   LMP 08/13/1995   SpO2 97%   BMI 23.73 kg/m  Wt Readings from Last 3 Encounters:  08/31/23 147 lb (66.7 kg)  07/24/23 142 lb (64.4 kg)  07/20/23 145 lb (65.8 kg)    Physical Exam Vitals reviewed.  Constitutional:      General: She is not in acute distress.    Appearance: Normal appearance.  HENT:     Head: Normocephalic and  atraumatic.     Right Ear: External ear normal.     Left Ear: External ear normal.  Eyes:     General: No scleral icterus.       Right eye: No discharge.        Left eye: No discharge.     Conjunctiva/sclera: Conjunctivae normal.  Neck:     Thyroid: No thyromegaly.  Cardiovascular:     Rate and  Rhythm: Normal rate and regular rhythm.  Pulmonary:     Effort: No respiratory distress.     Comments: Increased congestion - lungs - cleared some with coughing.  Musculoskeletal:        General: No swelling or tenderness.     Cervical back: Neck supple. No tenderness.  Lymphadenopathy:     Cervical: No cervical adenopathy.  Skin:    Findings: No erythema or rash.  Neurological:     Mental Status: She is alert.  Psychiatric:        Mood and Affect: Mood normal.        Behavior: Behavior normal.         Outpatient Encounter Medications as of 08/31/2023  Medication Sig   amLODipine (NORVASC) 10 MG tablet TAKE 1 TABLET EVERY DAY   amoxicillin-clavulanate (AUGMENTIN) 875-125 MG tablet Take 1 tablet by mouth 2 (two) times daily.   ASPIRIN 81 PO Take 1 tablet by mouth daily.   carvedilol (COREG) 12.5 MG tablet Take 1 tablet (12.5 mg total) by mouth 2 (two) times daily.   folic acid (FOLVITE) 1 MG tablet Take 1 mg by mouth daily.   hydrochlorothiazide (MICROZIDE) 12.5 MG capsule Take 1 capsule (12.5 mg total) by mouth daily.   KLOR-CON M10 10 MEQ tablet TAKE 2 TABLETS BY MOUTH TWICE A DAY   latanoprost (XALATAN) 0.005 % ophthalmic solution Place 1 drop into both eyes at bedtime.    magnesium oxide (MAG-OX) 400 (240 Mg) MG tablet Take 1 tablet (400 mg total) by mouth daily.   predniSONE (DELTASONE) 10 MG tablet Take 6 tablets x 1 day and then decrease by 1/2 tablet per day until down to zero mg.   rosuvastatin (CRESTOR) 10 MG tablet TAKE 1 TABLET EVERY DAY   sulfaSALAzine (AZULFIDINE) 500 MG tablet Take 1,500 mg by mouth 2 (two) times daily. 3 tabs   timolol (TIMOPTIC) 0.5 % ophthalmic  solution Place 1 drop into both eyes 2 (two) times daily.   traMADol (ULTRAM) 50 MG tablet Take 1 tablet (50 mg total) by mouth 2 (two) times daily. Each refill must last 30 days.   triamcinolone cream (KENALOG) 0.1 % Apply 1 Application topically as needed (prn). As needed   Vitamin D, Ergocalciferol, (DRISDOL) 1.25 MG (50000 UNIT) CAPS capsule Take 1 capsule (50,000 Units total) by mouth every 7 (seven) days.   No facility-administered encounter medications on file as of 08/31/2023.     Lab Results  Component Value Date   WBC 7.0 07/13/2023   HGB 11.9 (L) 07/13/2023   HCT 35.9 (L) 07/13/2023   PLT 180.0 07/13/2023   GLUCOSE 84 07/13/2023   CHOL 176 07/13/2023   TRIG 129.0 07/13/2023   HDL 68.10 07/13/2023   LDLDIRECT 132.5 05/06/2013   LDLCALC 82 07/13/2023   ALT 10 07/13/2023   AST 20 07/13/2023   NA 144 07/13/2023   K 3.8 07/13/2023   CL 106 07/13/2023   CREATININE 0.87 07/13/2023   BUN 19 07/13/2023   CO2 31 07/13/2023   TSH 2.22 05/13/2023   HGBA1C 5.7 10/19/2012    ECHOCARDIOGRAM COMPLETE Result Date: 03/17/2023    ECHOCARDIOGRAM REPORT   Patient Name:   PEGGI CHIRILLO Date of Exam: 03/17/2023 Medical Rec #:  [id number]         Height:       66.0 in Accession #:    [id number]        Weight:       140.5 lb Date of  Birth:  January 14, 1946         BSA:          1.721 m Patient Age:    77 years          BP:           156/43 mmHg Patient Gender: F                 HR:           44 bpm. Exam Location:  Inpatient Procedure: 2D Echo, Cardiac Doppler and Color Doppler Indications:    second degree heart block  History:        Patient has no prior history of Echocardiogram examinations.                 Risk Factors:Hypertension and Dyslipidemia.  Sonographer:    Delcie Roch RDCS Referring Phys: [phone] BRIDGETTE CHRISTOPHER IMPRESSIONS  1. Intracavitary gradient. Peak gradient 1.7 m/s. Peak gradient 5.5 mmHg. Left ventricular ejection fraction, by estimation, is 60 to 65%. The left  ventricle has normal function. The left ventricle has no regional wall motion abnormalities. Left ventricular diastolic parameters are consistent with Grade I diastolic dysfunction (impaired relaxation).  2. Right ventricular systolic function is normal. The right ventricular size is normal.  3. Left atrial size was mildly dilated.  4. The mitral valve is normal in structure. Mild mitral valve regurgitation. No evidence of mitral stenosis.  5. Aortic outflow gradients are mildly elevated. The aortic valve appears normal. Cannot exclude a subvalvular or supravalvular membrane. The aortic valve is tricuspid. Aortic valve regurgitation is not visualized. Mild aortic valve stenosis. Aortic valve area, by VTI measures 2.03 cm. Aortic valve mean gradient measures 10.0 mmHg. Aortic valve Vmax measures 2.23 m/s.  6. The inferior vena cava is normal in size with greater than 50% respiratory variability, suggesting right atrial pressure of 3 mmHg. FINDINGS  Left Ventricle: Intracavitary gradient. Peak gradient 1.7 m/s. Peak gradient 5.5 mmHg. Left ventricular ejection fraction, by estimation, is 60 to 65%. The left ventricle has normal function. The left ventricle has no regional wall motion abnormalities.  The left ventricular internal cavity size was normal in size. There is no left ventricular hypertrophy. Left ventricular diastolic parameters are consistent with Grade I diastolic dysfunction (impaired relaxation). Right Ventricle: The right ventricular size is normal. No increase in right ventricular wall thickness. Right ventricular systolic function is normal. Left Atrium: Left atrial size was mildly dilated. Right Atrium: Right atrial size was normal in size. Pericardium: Trivial pericardial effusion is present. Mitral Valve: The mitral valve is normal in structure. Mild mitral valve regurgitation. No evidence of mitral valve stenosis. Tricuspid Valve: The tricuspid valve is normal in structure. Tricuspid valve  regurgitation is not demonstrated. No evidence of tricuspid stenosis. Aortic Valve: Aortic outflow gradients are mildly elevated. The aortic valve appears normal. Cannot exclude a subvalvular or supravalvular membrane. The aortic valve is tricuspid. Aortic valve regurgitation is not visualized. Mild aortic stenosis is present. Aortic valve mean gradient measures 10.0 mmHg. Aortic valve peak gradient measures 19.9 mmHg. Aortic valve area, by VTI measures 2.03 cm. Pulmonic Valve: The pulmonic valve was normal in structure. Pulmonic valve regurgitation is not visualized. No evidence of pulmonic stenosis. Aorta: The aortic root is normal in size and structure. Venous: The inferior vena cava is normal in size with greater than 50% respiratory variability, suggesting right atrial pressure of 3 mmHg. IAS/Shunts: No atrial level shunt detected by color flow Doppler.  LEFT  VENTRICLE PLAX 2D LVIDd:         4.20 cm   Diastology LVIDs:         2.80 cm   LV e' medial:  13.60 cm/s LV PW:         0.90 cm   LV e' lateral: 16.30 cm/s LV IVS:        0.90 cm LVOT diam:     1.80 cm LV SV:         112 LV SV Index:   65 LVOT Area:     2.54 cm  RIGHT VENTRICLE             IVC RV Basal diam:  2.50 cm     IVC diam: 1.40 cm RV S prime:     13.10 cm/s TAPSE (M-mode): 2.3 cm LEFT ATRIUM             Index        RIGHT ATRIUM          Index LA diam:        3.40 cm 1.98 cm/m   RA Area:     9.88 cm LA Vol (A2C):   67.8 ml 39.39 ml/m  RA Volume:   18.70 ml 10.87 ml/m LA Vol (A4C):   48.1 ml 27.95 ml/m LA Biplane Vol: 57.0 ml 33.12 ml/m  AORTIC VALVE AV Area (Vmax):    2.06 cm AV Area (Vmean):   1.78 cm AV Area (VTI):     2.03 cm AV Vmax:           223.00 cm/s AV Vmean:          147.000 cm/s AV VTI:            0.553 m AV Peak Grad:      19.9 mmHg AV Mean Grad:      10.0 mmHg LVOT Vmax:         180.50 cm/s LVOT Vmean:        103.000 cm/s LVOT VTI:          0.442 m LVOT/AV VTI ratio: 0.80  AORTA Ao Root diam: 2.60 cm Ao Asc diam:  2.70 cm   SHUNTS Systemic VTI:  0.44 m Systemic Diam: 1.80 cm Chilton Si MD Electronically signed by Chilton Si MD Signature Date/Time: 03/17/2023/5:30:16 PM    Final    DG Chest 2 View Result Date: 03/16/2023 CLINICAL DATA:  cp EXAM: CHEST - 2 VIEW COMPARISON:  None Available. FINDINGS: No pleural effusion. No pneumothorax. No airspace opacity. Normal cardiac and mediastinal contours. No radiographically apparent displaced rib fractures. Visualized upper abdomen is unremarkable. Vertebral body heights are maintained. Partially imaged cervical spinal fusion hardware in place. Degenerative changes of the bilateral glenohumeral joints, left-greater-than-right. IMPRESSION: No focal airspace opacity. Electronically Signed   By: Lorenza Cambridge M.D.   On: 03/16/2023 14:14       Assessment & Plan:  Acute cough Assessment & Plan: Increased cough and congestion as outlined.  Coughing fits. Exam as outlined.  Check cxr. Continue saline nasal spray and steroid nasal spray. Prednisone taper as directed. Augmentin 875mg  bid.  Will check xray to confirm no pneumonia.  Follow.  Call with update.   Orders: -     DG Chest 2 View; Future  Crohn's disease of large intestine without complication (HCC) Assessment & Plan: Stable.  Continue sulfasalazine.  Has been followed by GI ([facility]).     Primary hypertension Assessment & Plan: Continue lisinopril, amlodipine, hydrochlorothiazide.  Added carvedilol - after pacemaker placement. Follow pressures.    Other orders -     predniSONE; Take 6 tablets x 1 day and then decrease by 1/2 tablet per day until down to zero mg.  Dispense: 39 tablet; Refill: 0 -     Amoxicillin-Pot Clavulanate; Take 1 tablet by mouth 2 (two) times daily.  Dispense: 14 tablet; Refill: 0     Dale Marina, MD

## 2023-08-31 NOTE — Assessment & Plan Note (Signed)
Continue lisinopril, amlodipine, hydrochlorothiazide.  Added carvedilol - after pacemaker placement. Follow pressures.

## 2023-09-01 LAB — COVID-19, FLU A+B AND RSV
Influenza A, NAA: NOT DETECTED
Influenza B, NAA: NOT DETECTED
RSV, NAA: DETECTED — AB
SARS-CoV-2, NAA: NOT DETECTED

## 2023-09-01 MED ORDER — ALBUTEROL SULFATE HFA 108 (90 BASE) MCG/ACT IN AERS: 2.0000 | INHALATION_SPRAY | Freq: Four times a day (QID) | RESPIRATORY_TRACT | 0 refills | Status: DC | PRN

## 2023-09-01 NOTE — Addendum Note (Signed)
Addended by: Charm Barges on: 09/01/2023 05:02 PM   Modules accepted: Orders

## 2023-09-04 ENCOUNTER — Telehealth: Payer: Self-pay

## 2023-09-04 NOTE — Telephone Encounter (Signed)
FYI   Called patient and she says that she has improved some but still coughing a lot. She says that she did have a good night last night. She is not SOB, no chest tightness. She did not want to go to urgent care yet. Wanted to give it a couple more days. Advised if any changes, needs to be seen ASAP.

## 2023-09-04 NOTE — Telephone Encounter (Signed)
Noted. Per discussion, since improving, pt declined evaluation this pm.  Will be evaluated if symptoms change or worsen or no improvement.

## 2023-09-04 NOTE — Telephone Encounter (Signed)
Copied from CRM [id number]. Topic: Clinical - Medical Advice >> Sep 04, 2023  2:28 PM Alcus Dad H wrote: Reason for CRM: Patient asked for a call back from Dr. Roby Lofts nurse. She said she was seen recently and put on medications that aren't helping because she is still coughing. Wants a call back.

## 2023-09-05 ENCOUNTER — Encounter: Payer: Self-pay | Admitting: Emergency Medicine

## 2023-09-05 ENCOUNTER — Ambulatory Visit
Admission: EM | Admit: 2023-09-05 | Discharge: 2023-09-05 | Disposition: A | Discharge status: Home / Self Care | Payer: HMO | Attending: Emergency Medicine | Admitting: Emergency Medicine

## 2023-09-05 DIAGNOSIS — R051 Acute cough: Secondary | ICD-10-CM | POA: Diagnosis not present

## 2023-09-05 MED ORDER — BENZONATATE 100 MG PO CAPS: 200.0000 mg | ORAL_CAPSULE | Freq: Three times a day (TID) | ORAL | 0 refills | Status: DC

## 2023-09-05 MED ORDER — PROMETHAZINE-DM 6.25-15 MG/5ML PO SYRP
5.0000 mL | ORAL_SOLUTION | Freq: Four times a day (QID) | ORAL | 0 refills | Status: DC | PRN
Start: 2023-09-05 — End: 2023-09-29

## 2023-09-05 NOTE — Discharge Instructions (Addendum)
As we discussed, you are positive for RSV and thick cough from RSV can last 3 weeks or more.  Continue the medications that were previously prescribed by your primary care provider.  Especially, the albuterol inhaler as this may help you with any shortness of breath or wheezing.  Start taking the [employer] every 8 hours during the day as needed for cough.  Take them with a small sip of water.  They may give you numbness to the base of your tongue, or a metallic taste in your mouth, this is normal.  Use the Promethazine DM cough syrup at bedtime as needed for cough and congestion.  Be mindful this medication will make you sleepy so if you have to get up in the middle night to use the bathroom make sure that you sit at the edge of the bed and have your bearings about you before you attempt to stand and walk.  If you develop any new or worsening symptoms either follow-up with your primary care provider or return for reevaluation.

## 2023-09-05 NOTE — ED Triage Notes (Signed)
Patient was diagnosed with RSV on Monday.  Patient reports ongoing cough and chest congestion that has not improved.  Patient unsure of fevers.

## 2023-09-05 NOTE — ED Provider Notes (Signed)
[facility]    CSN: [id number] Arrival date & time: 09/05/23  0904      History   Chief Complaint Chief Complaint  Patient presents with   RSV Positive   Cough    HPI Ellen Drake is a 78 y.o. female.   HPI  78 year old female with past medical history significant for Crohn's disease, diverticulosis, anemia, high cholesterol, hypertension, hyperlipidemia, secondary AV block with a pacemaker presents for evaluation of ongoing cough.  The patient was diagnosed with RSV 5 days ago by her primary care doctor and started on Augmentin, prednisone, and an albuterol inhaler.  She was not given any medicine for cough.  Chest x-ray at that time was negative for any acute cardiopulmonary disease.  She was told that if her cough is not improved by today that she was to come to urgent care.  She denies any chest pain.  No fever.  Past Medical History:  Diagnosis Date   Anemia    reslved   Colon polyp 2009   Crohn's disease (HCC)    Diverticulosis    Fibrocystic breast disease    Herniated nucleus pulposus of lumbosacral region    History of chicken pox    Hypercholesterolemia    Hyperlipidemia    Hypertension    Osteoarthritis    Pulmonary nodule seen on imaging study 2004   resolved 2007    Patient Active Problem List   Diagnosis Date Noted   Environmental allergies 07/18/2023   Foot cramps 07/13/2023   Heart block AV second degree 03/17/2023   Bradycardia 03/16/2023   Second degree AV block, Mobitz type II 03/16/2023   Anemia 01/08/2023   Light headedness 10/15/2022   Leg cramps 10/15/2022   Falls, sequela 09/08/2022   Head trauma 08/26/2022   Left shoulder pain 12/24/2021   Cough 06/16/2021   Chronic use of opiate for therapeutic purpose 02/19/2021   Neurogenic pain 02/05/2021   Vitamin D deficiency 01/17/2021   Pain and swelling of knee (Right) 01/14/2021   Fibrocystic breast disease in female 01/13/2021   Chronic pain syndrome 01/13/2021    Pharmacologic therapy 01/13/2021   Disorder of skeletal system 01/13/2021   Problems influencing health status 01/13/2021   Chronic pain disorder 01/13/2021   Skin lesion 03/17/2020   Shoulder pain, bilateral 03/16/2020   Right leg swelling 05/18/2019   Abdominal pain 05/18/2019   Vaginal burning 05/15/2019   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   Primary osteoarthritis of right knee 03/23/2017   Vitamin B12 deficiency 12/17/2015   Health care maintenance 10/02/2014   Chronic knee pain (1ry area of Pain) (Right) 06/15/2014   Disorder of the skin and subcutaneous tissue, unspecified 06/15/2014   History of unicompartmental knee replacement (Right) 06/15/2014    Class: History of   Chronic knee pain after total replacement of right knee joint 06/15/2014   Synovitis of knee 03/16/2014   Ear lesion 05/08/2013   Arthritis 05/08/2013   H/O adenomatous polyp of colon 11/29/2012   Hypertension 08/12/2012   Hypercholesterolemia 08/12/2012   Crohn's disease (HCC) 08/12/2012   S/P left colectomy 01/26/2003   Therapeutic drug monitoring 01/26/2003   Crohn's colitis (HCC) 01/25/2002    Past Surgical History:  Procedure Laterality Date   anterior cervical discectomy and fusion  1998   Dr Alvy Beal SURGERY  1992   ruptured disc (Dr Gerrit Heck)   BREAST BIOPSY Left    neg-no scar seen   CATARACT EXTRACTION W/PHACO Right 02/09/2019  Procedure: CATARACT EXTRACTION PHACO AND INTRAOCULAR LENS PLACEMENT (IOC) KAHOOK DUAL BLADE GONIOTOMY  RIGHT HEALON 5, VISION BLUE;  Surgeon: Lockie Mola, MD;  Location: [facility];  Service: Ophthalmology;  Laterality: Right;   COLECTOMY  2003   COLON SURGERY  1998   COLONOSCOPY  2009   COLONOSCOPY W/ BIOPSIES  11/30/2012   Procedure: COLONOSCOPY W/BIOPSY; Surgeon: Benjie Karvonen, MD; Location: [facility]; Service: Gastroenterology;;   COLONOSCOPY W/ BIOPSIES  04/15/2016   Procedure: Colonoscopy ; Surgeon: Benjie Karvonen, MD; Location: [facility]; Service: Gastroenterology; Laterality: N/A;   KNEE ARTHROSCOPY W/ SYNOVECTOMY Right 03/10/2014   limited   KNEE SURGERY Right 1/16   [facility] (Dr. Ophelia Shoulder)   PACEMAKER IMPLANT N/A 03/19/2023   Procedure: PACEMAKER IMPLANT;  Surgeon: Regan Lemming, MD;  Location: [facility];  Service: Cardiovascular;  Laterality: N/A;   POSTERIOR LAMINECTOMY / DECOMPRESSION LUMBAR SPINE  1999   REPLACEMENT TOTAL KNEE     ROTATOR CUFF REPAIR Left 2007   acute open; accident    OB History   No obstetric history on file.      Home Medications    Prior to Admission medications   Medication Sig Start Date End Date Taking? Authorizing Provider  benzonatate (TESSALON) 100 MG capsule Take 2 capsules (200 mg total) by mouth every 8 (eight) hours. 09/05/23  Yes Becky Augusta, NP  promethazine-dextromethorphan (PROMETHAZINE-DM) 6.25-15 MG/5ML syrup Take 5 mLs by mouth 4 (four) times daily as needed. 09/05/23  Yes Becky Augusta, NP  albuterol (VENTOLIN HFA) 108 (90 Base) MCG/ACT inhaler Inhale 2 puffs into the lungs every 6 (six) hours as needed for wheezing or shortness of breath. 09/01/23   Dale [place], MD  amLODipine (NORVASC) 10 MG tablet TAKE 1 TABLET EVERY DAY 07/27/23   Dale [place], MD  amoxicillin-clavulanate (AUGMENTIN) 875-125 MG tablet Take 1 tablet by mouth 2 (two) times daily. 08/31/23   Dale [place], MD  ASPIRIN 81 PO Take 1 tablet by mouth daily.    [provider]  carvedilol (COREG) 12.5 MG tablet Take 1 tablet (12.5 mg total) by mouth 2 (two) times daily. 05/22/23 05/21/24  Sheilah Pigeon, PA-C  folic acid (FOLVITE) 1 MG tablet Take 1 mg by mouth daily.    [provider]  hydrochlorothiazide (MICROZIDE) 12.5 MG capsule Take 1 capsule (12.5 mg total) by mouth daily. 05/18/23   Dale [place], MD  KLOR-CON M10 10 MEQ tablet TAKE 2 TABLETS BY MOUTH TWICE A DAY 08/18/23   Dale [place], MD  latanoprost  (XALATAN) 0.005 % ophthalmic solution Place 1 drop into both eyes at bedtime.     [provider]  magnesium oxide (MAG-OX) 400 (240 Mg) MG tablet Take 1 tablet (400 mg total) by mouth daily. 05/13/23   Dale [place], MD  predniSONE (DELTASONE) 10 MG tablet Take 6 tablets x 1 day and then decrease by 1/2 tablet per day until down to zero mg. 08/31/23   Dale [place], MD  rosuvastatin (CRESTOR) 10 MG tablet TAKE 1 TABLET EVERY DAY 07/27/23   Dale , MD  sulfaSALAzine (AZULFIDINE) 500 MG tablet Take 1,500 mg by mouth 2 (two) times daily. 3 tabs    [provider]  timolol (TIMOPTIC) 0.5 % ophthalmic solution Place 1 drop into both eyes 2 (two) times daily. 11/26/17   [provider]  traMADol (ULTRAM) 50 MG tablet Take 1 tablet (50 mg total) by mouth 2 (two) times daily. Each refill  must last 30 days. 05/27/23 11/23/23  Delano Metz, MD  triamcinolone cream (KENALOG) 0.1 % Apply 1 Application topically as needed (prn). As needed 02/06/23   [provider]  Vitamin D, Ergocalciferol, (DRISDOL) 1.25 MG (50000 UNIT) CAPS capsule Take 1 capsule (50,000 Units total) by mouth every 7 (seven) days. 07/17/23   Dale [place], MD    Family History Family History  Problem Relation Age of Onset   CVA Mother    Hypertension Mother    Hypertension Sister        brain tumor   Breast cancer Neg Hx    Colon cancer Neg Hx     Social History Social History   Tobacco Use   Smoking status: Former    Current packs/day: 0.00    Types: Cigarettes    Quit date: 09/10/2009    Years since quitting: 13.9   Smokeless tobacco: Never  Vaping Use   Vaping status: Never Used  Substance Use Topics   Alcohol use: Yes    Alcohol/week: 1.0 standard drink of alcohol    Types: 1 Glasses of wine per week    Comment: wine occasional; socially   Drug use: No     Allergies   Mercaptopurine, Lyrica [pregabalin], and Dilaudid [hydromorphone hcl]   Review of  Systems Review of Systems  Constitutional:  Negative for fever.  Respiratory:  Positive for cough and shortness of breath. Negative for wheezing.      Physical Exam Triage Vital Signs ED Triage Vitals  Encounter Vitals Group     BP 09/05/23 0942 (!) 155/64     Systolic BP Percentile --      Diastolic BP Percentile --      Pulse Rate 09/05/23 0942 64     Resp 09/05/23 0942 16     Temp 09/05/23 0942 98 F (36.7 C)     Temp Source 09/05/23 0942 Oral     SpO2 09/05/23 0942 95 %     Weight 09/05/23 0941 147 lb 0.8 oz (66.7 kg)     Height 09/05/23 0941 5\' 6"  (1.676 m)     Head Circumference --      Peak Flow --      Pain Score 09/05/23 0940 2     Pain Loc --      Pain Education --      Exclude from Growth Chart --    No data found.  Updated Vital Signs BP (!) 155/64 (BP Location: Left Arm)   Pulse 64   Temp 98 F (36.7 C) (Oral)   Resp 16   Ht 5\' 6"  (1.676 m)   Wt 147 lb 0.8 oz (66.7 kg)   LMP 08/13/1995   SpO2 95%   BMI 23.73 kg/m   Visual Acuity Right Eye Distance:   Left Eye Distance:   Bilateral Distance:    Right Eye Near:   Left Eye Near:    Bilateral Near:     Physical Exam Vitals and nursing note reviewed.  Constitutional:      Appearance: Normal appearance. She is not ill-appearing.  HENT:     Head: Normocephalic and atraumatic.  Cardiovascular:     Rate and Rhythm: Normal rate and regular rhythm.     Pulses: Normal pulses.     Heart sounds: Normal heart sounds. No murmur heard.    No friction rub. No gallop.  Pulmonary:     Effort: Pulmonary effort is normal.     Breath sounds: Wheezing and rhonchi  present.     Comments: Wheezes and rhonchi in bilateral upper and middle lung fields. Skin:    General: Skin is warm and dry.     Capillary Refill: Capillary refill takes less than 2 seconds.     Findings: No rash.  Neurological:     General: No focal deficit present.     Mental Status: She is alert and oriented to person, place, and time.       UC Treatments / Results  Labs (all labs ordered are listed, but only abnormal results are displayed) Labs Reviewed - No data to display  EKG   Radiology No results found.  Procedures Procedures (including critical care time)  Medications Ordered in UC Medications - No data to display  Initial Impression / Assessment and Plan / UC Course  I have reviewed the triage vital signs and the nursing notes.  Pertinent labs & imaging results that were available during my care of the patient were reviewed by me and considered in my medical decision making (see chart for details).   Patient is a nontoxic-appearing 78 year old female presenting for evaluation of ongoing cough in the setting of being RSV positive as outlined HPI above.  Her main complaint is that she cannot stop coughing and it is starting to interfere with her sleep at night.  She has been taking the prednisone, Augmentin, and using albuterol inhaler as previously prescribed.  She is able to speak in full sentence with dyspnea or tachypnea.  Room air oxygen saturation is 95% and her respiratory rate at triage was 16.  Cardiopulmonary exam reveals wheezes and rhonchi bilaterally which is consistent with bronchitis.  She had a negative chest x-ray 5 days ago and I do not feel the need to repeat it today.  I will have her continue the previously prescribed medicines and add on Tessalon Perles and Promethazine DM cough syrup to help with patient's cough symptoms.  I did advise her that her cough may continue for 3 weeks or more.   Final Clinical Impressions(s) / UC Diagnoses   Final diagnoses:  Acute cough     Discharge Instructions      As we discussed, you are positive for RSV and thick cough from RSV can last 3 weeks or more.  Continue the medications that were previously prescribed by your primary care provider.  Especially, the albuterol inhaler as this may help you with any shortness of breath or  wheezing.  Start taking the [employer] every 8 hours during the day as needed for cough.  Take them with a small sip of water.  They may give you numbness to the base of your tongue, or a metallic taste in your mouth, this is normal.  Use the Promethazine DM cough syrup at bedtime as needed for cough and congestion.  Be mindful this medication will make you sleepy so if you have to get up in the middle night to use the bathroom make sure that you sit at the edge of the bed and have your bearings about you before you attempt to stand and walk.  If you develop any new or worsening symptoms either follow-up with your primary care provider or return for reevaluation.     ED Prescriptions     Medication Sig Dispense Auth. Provider   benzonatate (TESSALON) 100 MG capsule Take 2 capsules (200 mg total) by mouth every 8 (eight) hours. 21 capsule Becky Augusta, NP   promethazine-dextromethorphan (PROMETHAZINE-DM) 6.25-15 MG/5ML syrup Take 5 mLs  by mouth 4 (four) times daily as needed. 118 mL Becky Augusta, NP      PDMP not reviewed this encounter.   Becky Augusta, NP 09/05/23 1005

## 2023-09-07 ENCOUNTER — Telehealth: Payer: Self-pay | Admitting: Cardiology

## 2023-09-07 NOTE — Telephone Encounter (Signed)
Patient is calling because she went to the Urgent Care on 09/05/23 and stated they we concerned about fluid around her device. Please advise.

## 2023-09-07 NOTE — Telephone Encounter (Signed)
Spoke to Ellen Drake, states she went to urgent care for respiratory symptoms. States she saw her PCP and urgent care and taking antibiotics. Patient confirmed no swelling around site. Patient stated she was thinking fluid was in her lungs not her pacemaker site. States she did have CXR at urgent care. Advised pt to follow up w/ PCP after she completes abx to see if they want to recheck her. Pt voiced understanding and appreciative of call.

## 2023-09-09 ENCOUNTER — Telehealth: Payer: Self-pay

## 2023-09-09 NOTE — Telephone Encounter (Signed)
Copied from CRM [id number]. Topic: Clinical - Medical Advice >> Sep 09, 2023  4:21 PM Sonny Dandy B wrote: Reason for CRM: pt called to speak with provider regarding RSV states she is not getting any better . Pt is requesting a call back [phone]

## 2023-09-10 NOTE — Telephone Encounter (Signed)
Patient complaining of back pain from coughing. Says she does not feel any better after being dx with RSV 2 wks ago. She has been to the walk in. Cough is not getting better. Asked to be re-evaluated. Confirmed no fever, sob, chest tightness, etc. Patient has been scheduled for acute visit 1/31 and urgent care advice given.

## 2023-09-11 ENCOUNTER — Ambulatory Visit: Payer: HMO

## 2023-09-11 ENCOUNTER — Ambulatory Visit (INDEPENDENT_AMBULATORY_CARE_PROVIDER_SITE_OTHER): Payer: HMO | Admitting: Nurse Practitioner

## 2023-09-11 VITALS — BP 120/70 | HR 75 | Temp 100.0°F | Ht 66.0 in | Wt 147.2 lb

## 2023-09-11 DIAGNOSIS — B338 Other specified viral diseases: Secondary | ICD-10-CM | POA: Diagnosis not present

## 2023-09-11 DIAGNOSIS — Z950 Presence of cardiac pacemaker: Secondary | ICD-10-CM | POA: Diagnosis not present

## 2023-09-11 DIAGNOSIS — B974 Respiratory syncytial virus as the cause of diseases classified elsewhere: Secondary | ICD-10-CM | POA: Diagnosis not present

## 2023-09-11 DIAGNOSIS — R059 Cough, unspecified: Secondary | ICD-10-CM | POA: Diagnosis not present

## 2023-09-11 MED ORDER — PREDNISONE 20 MG PO TABS: 40.0000 mg | ORAL_TABLET | Freq: Every day | ORAL | 0 refills | Status: DC

## 2023-09-11 MED ORDER — FLUTICASONE PROPIONATE HFA 110 MCG/ACT IN AERO: 2.0000 | INHALATION_SPRAY | Freq: Two times a day (BID) | RESPIRATORY_TRACT | 0 refills | Status: DC

## 2023-09-11 MED ORDER — BENZONATATE 200 MG PO CAPS: 200.0000 mg | ORAL_CAPSULE | Freq: Three times a day (TID) | ORAL | 0 refills | Status: DC | PRN

## 2023-09-11 NOTE — Progress Notes (Signed)
Bethanie Dicker, NP-C Phone: [phone]  Ellen Drake is a 78 y.o. female who presents today for cough.  Discussed the use of AI scribe software for clinical note transcription with the patient, who gave verbal consent to proceed.  History of Present Illness   The patient presents with persistent cough and respiratory symptoms following a diagnosis of RSV.   Persistent cough and respiratory symptoms have been ongoing since a diagnosis of RSV on January 20th. Despite completing a course of Augmentin and prednisone, symptoms have not significantly improved. Tessalon Perles and cough syrup were also prescribed, with Tessalon Perles providing more relief.  She has been using an albuterol inhaler, primarily at night before bed, and sometimes two to three times a day. No shortness of breath is noted, but she feels fatigued and has some back in her back. She describes congestion and pain under the rib cage when coughing, but denies fevers.  She has a pacemaker and is cautious about exposure to others, wearing a mask when going out. She has not been around children recently and has not attended church in the past month.     Social History   Tobacco Use  Smoking Status Former   Current packs/day: 0.00   Types: Cigarettes   Quit date: 09/10/2009   Years since quitting: 14.0  Smokeless Tobacco Never    Current Outpatient Medications on File Prior to Visit  Medication Sig Dispense Refill   albuterol (VENTOLIN HFA) 108 (90 Base) MCG/ACT inhaler Inhale 2 puffs into the lungs every 6 (six) hours as needed for wheezing or shortness of breath. 18 g 0   amLODipine (NORVASC) 10 MG tablet TAKE 1 TABLET EVERY DAY 90 tablet 3   ASPIRIN 81 PO Take 1 tablet by mouth daily.     carvedilol (COREG) 12.5 MG tablet Take 1 tablet (12.5 mg total) by mouth 2 (two) times daily. 90 tablet 3   folic acid (FOLVITE) 1 MG tablet Take 1 mg by mouth daily.     hydrochlorothiazide (MICROZIDE) 12.5 MG capsule Take 1  capsule (12.5 mg total) by mouth daily. 90 capsule 1   KLOR-CON M10 10 MEQ tablet TAKE 2 TABLETS BY MOUTH TWICE A DAY 360 tablet 1   latanoprost (XALATAN) 0.005 % ophthalmic solution Place 1 drop into both eyes at bedtime.      magnesium oxide (MAG-OX) 400 (240 Mg) MG tablet Take 1 tablet (400 mg total) by mouth daily. 90 tablet 1   promethazine-dextromethorphan (PROMETHAZINE-DM) 6.25-15 MG/5ML syrup Take 5 mLs by mouth 4 (four) times daily as needed. 118 mL 0   rosuvastatin (CRESTOR) 10 MG tablet TAKE 1 TABLET EVERY DAY 90 tablet 3   sulfaSALAzine (AZULFIDINE) 500 MG tablet Take 1,500 mg by mouth 2 (two) times daily. 3 tabs     timolol (TIMOPTIC) 0.5 % ophthalmic solution Place 1 drop into both eyes 2 (two) times daily.  3   traMADol (ULTRAM) 50 MG tablet Take 1 tablet (50 mg total) by mouth 2 (two) times daily. Each refill must last 30 days. 60 tablet 5   triamcinolone cream (KENALOG) 0.1 % Apply 1 Application topically as needed (prn). As needed     Vitamin D, Ergocalciferol, (DRISDOL) 1.25 MG (50000 UNIT) CAPS capsule Take 1 capsule (50,000 Units total) by mouth every 7 (seven) days. 12 capsule 1   No current facility-administered medications on file prior to visit.    ROS see history of present illness  Objective  Physical Exam Vitals:   09/11/23  1104  BP: 120/70  Pulse: 75  Temp: 100 F (37.8 C)  SpO2: 100%    BP Readings from Last 3 Encounters:  09/11/23 120/70  09/05/23 (!) 155/64  08/31/23 138/76   Wt Readings from Last 3 Encounters:  09/11/23 147 lb 3.2 oz (66.8 kg)  09/05/23 147 lb 0.8 oz (66.7 kg)  08/31/23 147 lb (66.7 kg)    Physical Exam Constitutional:      General: She is not in acute distress.    Appearance: Normal appearance.  HENT:     Head: Normocephalic.  Cardiovascular:     Rate and Rhythm: Normal rate and regular rhythm.     Heart sounds: Normal heart sounds.  Pulmonary:     Effort: Pulmonary effort is normal.     Comments: Coarse breath  sounds noted throughout bilateral lungs Skin:    General: Skin is warm and dry.  Neurological:     General: No focal deficit present.     Mental Status: She is alert.  Psychiatric:        Mood and Affect: Mood normal.        Behavior: Behavior normal.    Assessment/Plan: Please see individual problem list.  RSV (respiratory syncytial virus infection) Assessment & Plan: Persistent cough and discomfort continue despite completing Augmentin and Prednisone. A previous chest X-ray was normal. There is no shortness of breath or fever. Order a repeat chest X-ray today. Prescribe a new course of Prednisone. Add flovent inhaler to be used with 2 puffs in the morning and at bedtime. Use the existing Albuterol inhaler, 2 puffs every 4 hours for the next 2 days. Prescribe Tessalon Perles for cough relief. Maintain hydration. Strict return precautions given to patient.   Orders: -     DG Chest 2 View; Future -     Benzonatate; Take 1 capsule (200 mg total) by mouth 3 (three) times daily as needed for cough.  Dispense: 30 capsule; Refill: 0 -     predniSONE; Take 2 tablets (40 mg total) by mouth daily.  Dispense: 10 tablet; Refill: 0 -     Fluticasone Propionate HFA; Inhale 2 puffs into the lungs in the morning and at bedtime.  Dispense: 12 g; Refill: 0   Return if symptoms worsen or fail to improve.   Bethanie Dicker, NP-C  Primary Care - [facility]

## 2023-09-11 NOTE — Assessment & Plan Note (Signed)
Persistent cough and discomfort continue despite completing Augmentin and Prednisone. A previous chest X-ray was normal. There is no shortness of breath or fever. Order a repeat chest X-ray today. Prescribe a new course of Prednisone. Add flovent inhaler to be used with 2 puffs in the morning and at bedtime. Use the existing Albuterol inhaler, 2 puffs every 4 hours for the next 2 days. Prescribe Tessalon Perles for cough relief. Maintain hydration. Strict return precautions given to patient.

## 2023-09-17 ENCOUNTER — Ambulatory Visit: Payer: Self-pay | Admitting: Internal Medicine

## 2023-09-17 ENCOUNTER — Other Ambulatory Visit (HOSPITAL_COMMUNITY): Payer: Self-pay

## 2023-09-17 NOTE — Telephone Encounter (Signed)
 Copied from CRM [id number]. Topic: Clinical - Prescription Issue >> Sep 17, 2023  3:18 PM Joanell B wrote: Reason for CRM: Pt stated that she is waiting on a call from Trish to confirm if her prescriptions have been sent to the pharmacy due to the change of insurance. Pharmacy informed her that they don't have anything on file.

## 2023-09-18 ENCOUNTER — Ambulatory Visit (INDEPENDENT_AMBULATORY_CARE_PROVIDER_SITE_OTHER): Payer: Medicare HMO

## 2023-09-18 DIAGNOSIS — I441 Atrioventricular block, second degree: Secondary | ICD-10-CM

## 2023-09-19 LAB — CUP PACEART REMOTE DEVICE CHECK
Battery Remaining Longevity: 148 mo
Battery Voltage: 3.16 V
Brady Statistic AP VP Percent: 13.24 %
Brady Statistic AP VS Percent: 0.32 %
Brady Statistic AS VP Percent: 74.7 %
Brady Statistic AS VS Percent: 11.74 %
Brady Statistic RA Percent Paced: 13.52 %
Brady Statistic RV Percent Paced: 87.94 %
Date Time Interrogation Session: 20250206232147
Implantable Lead Connection Status: 753985
Implantable Lead Connection Status: 753985
Implantable Lead Implant Date: 20240808
Implantable Lead Implant Date: 20240808
Implantable Lead Location: 753859
Implantable Lead Location: 753860
Implantable Lead Model: 3830
Implantable Lead Model: 5076
Implantable Pulse Generator Implant Date: 20240808
Lead Channel Impedance Value: 285 Ohm
Lead Channel Impedance Value: 361 Ohm
Lead Channel Impedance Value: 380 Ohm
Lead Channel Impedance Value: 722 Ohm
Lead Channel Pacing Threshold Amplitude: 0.375 V
Lead Channel Pacing Threshold Amplitude: 1.125 V
Lead Channel Pacing Threshold Pulse Width: 0.4 ms
Lead Channel Pacing Threshold Pulse Width: 0.4 ms
Lead Channel Sensing Intrinsic Amplitude: 19 mV
Lead Channel Sensing Intrinsic Amplitude: 19 mV
Lead Channel Sensing Intrinsic Amplitude: 3.375 mV
Lead Channel Sensing Intrinsic Amplitude: 3.375 mV
Lead Channel Setting Pacing Amplitude: 1.5 V
Lead Channel Setting Pacing Amplitude: 2.25 V
Lead Channel Setting Pacing Pulse Width: 0.4 ms
Lead Channel Setting Sensing Sensitivity: 1.2 mV
Zone Setting Status: 755011

## 2023-09-20 ENCOUNTER — Telehealth: Payer: Self-pay

## 2023-09-20 NOTE — Telephone Encounter (Signed)
 Scheduled remote reviewed. Normal device function.  1 VHR detection.  2 device classified SVT.  EGMs consistent with NSVT, longest 20 beats. Routing to triage for further review of VHR 20 beats.  Patient dx [id number] with RSV. VP percentage significant drop (pt hx of 2degree HB).

## 2023-09-21 ENCOUNTER — Other Ambulatory Visit: Payer: Self-pay

## 2023-09-21 ENCOUNTER — Other Ambulatory Visit (HOSPITAL_COMMUNITY): Payer: Self-pay

## 2023-09-21 MED ORDER — CARVEDILOL 12.5 MG PO TABS
12.5000 mg | ORAL_TABLET | Freq: Two times a day (BID) | ORAL | 3 refills | Status: DC
  Filled 2023-09-21: qty 90, 45d supply, fill #0

## 2023-09-21 MED ORDER — HYDROCHLOROTHIAZIDE 12.5 MG PO CAPS
12.5000 mg | ORAL_CAPSULE | Freq: Every day | ORAL | 1 refills | Status: DC
  Filled 2023-09-21: qty 90, 90d supply, fill #0

## 2023-09-21 MED ORDER — ROSUVASTATIN CALCIUM 10 MG PO TABS
10.0000 mg | ORAL_TABLET | Freq: Every day | ORAL | 3 refills | Status: DC
  Filled 2023-09-21: qty 90, 90d supply, fill #0

## 2023-09-21 MED ORDER — AMLODIPINE BESYLATE 10 MG PO TABS
10.0000 mg | ORAL_TABLET | Freq: Every day | ORAL | 3 refills | Status: AC
  Filled 2023-09-21: qty 90, 90d supply, fill #0

## 2023-09-21 MED ORDER — POTASSIUM CHLORIDE CRYS ER 10 MEQ PO TBCR
20.0000 meq | EXTENDED_RELEASE_TABLET | Freq: Two times a day (BID) | ORAL | 1 refills | Status: DC
  Filled 2023-09-21: qty 360, 90d supply, fill #0

## 2023-09-21 NOTE — Addendum Note (Signed)
 Addended by: Victorino Grates D on: 09/21/2023 08:51 AM   Modules accepted: Orders

## 2023-09-21 NOTE — Telephone Encounter (Signed)
 LM for patient. I have sent all of her maintenance medications to her new mail order pharmacy. Please relay message when she returns call.

## 2023-09-22 ENCOUNTER — Telehealth: Payer: Self-pay

## 2023-09-22 NOTE — Telephone Encounter (Signed)
Patient reports s/s are improving since recent RSV illness. She has follow up with PCP.  Reports discomfort below left rib cage/breast area, concerned related to device.  Reviewed with patient, likely a pulled muscle from cough.   Heart rates are well managed on device.   Noted significant drop in VP drop that corresponds with RSV illness. (Do not see programming changes, patient has HB previously dependent/ VP 100% since implant.

## 2023-09-22 NOTE — Telephone Encounter (Signed)
Copied from CRM [id number]. Topic: General - Other >> Sep 22, 2023  3:58 PM Gurney Maxin H wrote: Reason for CRM: Patient is calling to see if Dr. Lorin Picket wants her to come in sooner than 03/06, patient was recently diagnosed with RSV. Patient states she's feeling better but Cardiologist told her to check with provider to see if she needed to come in sooner. Please reach out to patient, thanks.  Irean Hong [phone]

## 2023-09-23 ENCOUNTER — Telehealth: Payer: Self-pay

## 2023-09-23 ENCOUNTER — Telehealth: Payer: Self-pay | Admitting: Student

## 2023-09-23 NOTE — Telephone Encounter (Signed)
Reviewed.  Please call and confirm doing ok.  It appears that cardiology instructed her to hold her amlodipine.  Can schedule f/u with me to f/u blood pressure and follow up swelling, etc.

## 2023-09-23 NOTE — Telephone Encounter (Signed)
Called and docu in lab tab

## 2023-09-23 NOTE — Telephone Encounter (Signed)
Pt states she had gone to bed last night and her feet/ ankles weren't swollen but woke up this morning and they were swollen. She states hasn't had this issue before. Went through medication list with pt and she is on hydrochlorothiazide 12.5 mg once daily prescribed by PCP. Asked pt if she was currently on Amlodipine (Norvasc) but pt denied even knowing what that medication was for and stated she doesn't take it. Pt denies SOB, CP, etc and stated she has tried to call PCP office and left a message but no one has called her back. Pt denies increased sodium intake and states she does have compression stockings- pt advised to wear the stockings and elevate legs throughout the day and to be sure to hydrate.   Forwarding to our EP staff as well as pt's PCP staff so everyone is aware.

## 2023-09-23 NOTE — Telephone Encounter (Signed)
Pt made aware of MD recommendation.  She will follow up w/ PCP

## 2023-09-23 NOTE — Telephone Encounter (Signed)
Pt c/o swelling/edema: STAT if pt has developed SOB within 24 hours  If swelling, where is the swelling located? Legs   How much weight have you gained and in what time span? None   Have you gained 2 pounds in a day or 5 pounds in a week? No   Do you have a log of your daily weights (if so, list)? Did not record   Are you currently taking a fluid pill? No   Are you currently SOB? No   Have you traveled recently in a car or plane for an extended period of time?

## 2023-09-23 NOTE — Telephone Encounter (Signed)
See other note

## 2023-09-23 NOTE — Telephone Encounter (Signed)
FYI- called patient. Confirmed doing ok. She is having ankle swelling and cardiology instructed her to hold amlodipine. Patient has been scheduled for work in appt next week with you to f/u on BP and ankle swelling.

## 2023-09-23 NOTE — Telephone Encounter (Signed)
Copied from CRM [id number]. Topic: General - Other >> Sep 23, 2023  8:56 AM [place] A wrote: Reason for CRM: Patient called back because she missed phone call

## 2023-09-23 NOTE — Telephone Encounter (Signed)
Vm left to cb in regards to chest xray   (Okay to read result please send a message back once completed with pts response)

## 2023-09-28 ENCOUNTER — Telehealth: Payer: Self-pay

## 2023-09-28 NOTE — Telephone Encounter (Signed)
Copied from CRM [id number]. Topic: Appointments - Appointment Cancel/Reschedule >> Sep 28, 2023  2:43 PM [place] J wrote: Patient would like to speak to a lady by the name of "Trish". I believe that is Dr. Roby Lofts Nurse/Medical Assistant. Patient would like to be squeezed in for tomorrow. I did tell the patient that the next available appointment wasn't until April, but patient insisted on accommodations.

## 2023-09-29 ENCOUNTER — Ambulatory Visit: Payer: HMO | Admitting: Internal Medicine

## 2023-09-29 VITALS — BP 138/78 | HR 82 | Temp 98.2°F | Resp 16 | Ht 65.0 in | Wt 152.0 lb

## 2023-09-29 DIAGNOSIS — M25561 Pain in right knee: Secondary | ICD-10-CM

## 2023-09-29 DIAGNOSIS — E559 Vitamin D deficiency, unspecified: Secondary | ICD-10-CM | POA: Diagnosis not present

## 2023-09-29 DIAGNOSIS — K5010 Crohn's disease of large intestine without complications: Secondary | ICD-10-CM

## 2023-09-29 DIAGNOSIS — E7800 Pure hypercholesterolemia, unspecified: Secondary | ICD-10-CM | POA: Diagnosis not present

## 2023-09-29 DIAGNOSIS — G8929 Other chronic pain: Secondary | ICD-10-CM | POA: Diagnosis not present

## 2023-09-29 DIAGNOSIS — D649 Anemia, unspecified: Secondary | ICD-10-CM

## 2023-09-29 DIAGNOSIS — I441 Atrioventricular block, second degree: Secondary | ICD-10-CM

## 2023-09-29 DIAGNOSIS — R252 Cramp and spasm: Secondary | ICD-10-CM

## 2023-09-29 DIAGNOSIS — B338 Other specified viral diseases: Secondary | ICD-10-CM

## 2023-09-29 DIAGNOSIS — I10 Essential (primary) hypertension: Secondary | ICD-10-CM

## 2023-09-29 LAB — CBC WITH DIFFERENTIAL/PLATELET
Basophils Absolute: 0 10*3/uL (ref 0.0–0.1)
Basophils Relative: 0.4 % (ref 0.0–3.0)
Eosinophils Absolute: 0.1 10*3/uL (ref 0.0–0.7)
Eosinophils Relative: 0.8 % (ref 0.0–5.0)
HCT: 34.7 % — ABNORMAL LOW (ref 36.0–46.0)
Hemoglobin: 11.6 g/dL — ABNORMAL LOW (ref 12.0–15.0)
Lymphocytes Relative: 23.6 % (ref 12.0–46.0)
Lymphs Abs: 1.6 10*3/uL (ref 0.7–4.0)
MCHC: 33.5 g/dL (ref 30.0–36.0)
MCV: 97.6 fL (ref 78.0–100.0)
Monocytes Absolute: 0.6 10*3/uL (ref 0.1–1.0)
Monocytes Relative: 9.5 % (ref 3.0–12.0)
Neutro Abs: 4.3 10*3/uL (ref 1.4–7.7)
Neutrophils Relative %: 65.7 % (ref 43.0–77.0)
Platelets: 178 10*3/uL (ref 150.0–400.0)
RBC: 3.56 Mil/uL — ABNORMAL LOW (ref 3.87–5.11)
RDW: 14.4 % (ref 11.5–15.5)
WBC: 6.6 10*3/uL (ref 4.0–10.5)

## 2023-09-29 LAB — BASIC METABOLIC PANEL
BUN: 14 mg/dL (ref 6–23)
CO2: 32 meq/L (ref 19–32)
Calcium: 9.7 mg/dL (ref 8.4–10.5)
Chloride: 105 meq/L (ref 96–112)
Creatinine, Ser: 0.9 mg/dL (ref 0.40–1.20)
GFR: 61.51 mL/min (ref >60.00)
Glucose, Bld: 85 mg/dL (ref 70–99)
Potassium: 3.6 meq/L (ref 3.5–5.1)
Sodium: 144 meq/L (ref 135–145)

## 2023-09-29 LAB — MAGNESIUM: Magnesium: 1.7 mg/dL (ref 1.5–2.5)

## 2023-09-29 LAB — SEDIMENTATION RATE: Sed Rate: 27 mm/h (ref 0–30)

## 2023-09-29 LAB — VITAMIN D 25 HYDROXY (VIT D DEFICIENCY, FRACTURES): VITD: 34.58 ng/mL (ref 30.00–100.00)

## 2023-09-29 NOTE — Progress Notes (Signed)
 Subjective:    Patient ID: Ellen Drake, female    DOB: Feb 28, 1946, 78 y.o.   MRN: [id number]  Patient here for  Chief Complaint  Patient presents with   Hypertension   Joint Swelling    HPI Here for a work in appt. Work in with concerns regarding some increased fatigue. Had called in with concern regarding leg swelling. She started wearing compression hose. This has helped her swelling. She does report fatigue. Had RSV recently. Is feeling overall better. No cough now.  No increased sob. Eating. No abdominal pain or bowel change. Blood pressure doing well.    Past Medical History:  Diagnosis Date   Anemia    reslved   Colon polyp 2009   Crohn's disease (HCC)    Diverticulosis    Fibrocystic breast disease    Herniated nucleus pulposus of lumbosacral region    History of chicken pox    Hypercholesterolemia    Hyperlipidemia    Hypertension    Osteoarthritis    Pulmonary nodule seen on imaging study 2004   resolved 2007   Past Surgical History:  Procedure Laterality Date   anterior cervical discectomy and fusion  1998   Dr Alvy Beal SURGERY  1992   ruptured disc (Dr Gerrit Heck)   BREAST BIOPSY Left    neg-no scar seen   CATARACT EXTRACTION W/PHACO Right 02/09/2019   Procedure: CATARACT EXTRACTION PHACO AND INTRAOCULAR LENS PLACEMENT (IOC) KAHOOK DUAL BLADE GONIOTOMY  RIGHT HEALON 5, VISION BLUE;  Surgeon: Lockie Mola, MD;  Location: [facility];  Service: Ophthalmology;  Laterality: Right;   COLECTOMY  2003   COLON SURGERY  1998   COLONOSCOPY  2009   COLONOSCOPY W/ BIOPSIES  11/30/2012   Procedure: COLONOSCOPY W/BIOPSY; Surgeon: Benjie Karvonen, MD; Location: [facility]; Service: Gastroenterology;;   COLONOSCOPY W/ BIOPSIES  04/15/2016   Procedure: Colonoscopy ; Surgeon: Benjie Karvonen, MD; Location: [facility]; Service: Gastroenterology; Laterality: N/A;   KNEE ARTHROSCOPY W/ SYNOVECTOMY Right 03/10/2014   limited    KNEE SURGERY Right 1/16   [facility] (Dr. Ophelia Shoulder)   PACEMAKER IMPLANT N/A 03/19/2023   Procedure: PACEMAKER IMPLANT;  Surgeon: Regan Lemming, MD;  Location: [facility];  Service: Cardiovascular;  Laterality: N/A;   POSTERIOR LAMINECTOMY / DECOMPRESSION LUMBAR SPINE  1999   REPLACEMENT TOTAL KNEE     ROTATOR CUFF REPAIR Left 2007   acute open; accident   Family History  Problem Relation Age of Onset   CVA Mother    Hypertension Mother    Hypertension Sister        brain tumor   Breast cancer Neg Hx    Colon cancer Neg Hx    Social History   Socioeconomic History   Marital status: Married    Spouse name: Not on file   Number of children: 0   Years of education: 12   Highest education level: Not on file  Occupational History   Not on file  Tobacco Use   Smoking status: Former    Current packs/day: 0.00    Types: Cigarettes    Quit date: 09/10/2009    Years since quitting: 14.0   Smokeless tobacco: Never  Vaping Use   Vaping status: Never Used  Substance and Sexual Activity   Alcohol use: Yes    Alcohol/week: 1.0 standard drink of alcohol    Types: 1 Glasses of wine per week    Comment: wine  occasional; socially   Drug use: No   Sexual activity: Yes  Other Topics Concern   Not on file  Social History Narrative   married   Social Drivers of Corporate investment banker Strain: Low Risk  (07/24/2023)   Overall Financial Resource Strain (CARDIA)    Difficulty of Paying Living Expenses: Not hard at all  Food Insecurity: No Food Insecurity (07/24/2023)   Hunger Vital Sign    Worried About Running Out of Food in the Last Year: Never true    Ran Out of Food in the Last Year: Never true  Transportation Needs: No Transportation Needs (07/24/2023)   PRAPARE - Administrator, Civil Service (Medical): No    Lack of Transportation (Non-Medical): No  Physical Activity: Inactive (07/24/2023)   Exercise Vital Sign    Days of Exercise per Week: 0  days    Minutes of Exercise per Session: 0 min  Stress: No Stress Concern Present (07/24/2023)   [employer] of Occupational Health - Occupational Stress Questionnaire    Feeling of Stress : Not at all  Social Connections: Socially Integrated (07/24/2023)   Social Connection and Isolation Panel [NHANES]    Frequency of Communication with Friends and Family: More than three times a week    Frequency of Social Gatherings with Friends and Family: Twice a week    Attends Religious Services: More than 4 times per year    Active Member of [employer] or Organizations: Yes    Attends Engineer, structural: More than 4 times per year    Marital Status: Married     Review of Systems  Constitutional:  Positive for fatigue. Negative for appetite change and unexpected weight change.  HENT:  Negative for congestion and sinus pressure.   Respiratory:  Negative for cough, chest tightness and shortness of breath.   Cardiovascular:  Negative for chest pain and palpitations.  Gastrointestinal:  Negative for abdominal pain, diarrhea, nausea and vomiting.  Genitourinary:  Negative for difficulty urinating and dysuria.  Musculoskeletal:  Negative for myalgias.       Persistent knee pain.   Skin:  Negative for color change and rash.  Neurological:  Negative for dizziness and headaches.  Psychiatric/Behavioral:  Negative for agitation and dysphoric mood.        Objective:     BP 138/78   Pulse 82   Temp 98.2 F (36.8 C)   Resp 16   Ht 5\' 5"  (1.651 m)   Wt 152 lb (68.9 kg)   LMP 08/13/1995   SpO2 98%   BMI 25.29 kg/m  Wt Readings from Last 3 Encounters:  09/29/23 152 lb (68.9 kg)  09/11/23 147 lb 3.2 oz (66.8 kg)  09/05/23 147 lb 0.8 oz (66.7 kg)    Physical Exam Vitals reviewed.  Constitutional:      General: She is not in acute distress.    Appearance: Normal appearance.  HENT:     Head: Normocephalic and atraumatic.     Right Ear: External ear normal.     Left Ear:  External ear normal.     Mouth/Throat:     Pharynx: No oropharyngeal exudate or posterior oropharyngeal erythema.  Eyes:     General: No scleral icterus.       Right eye: No discharge.        Left eye: No discharge.     Conjunctiva/sclera: Conjunctivae normal.  Neck:     Thyroid: No thyromegaly.  Cardiovascular:  Rate and Rhythm: Normal rate and regular rhythm.  Pulmonary:     Effort: No respiratory distress.     Breath sounds: Normal breath sounds. No wheezing.  Abdominal:     General: Bowel sounds are normal.     Palpations: Abdomen is soft.     Tenderness: There is no abdominal tenderness.  Musculoskeletal:        General: No swelling or tenderness.     Cervical back: Neck supple. No tenderness.  Lymphadenopathy:     Cervical: No cervical adenopathy.  Skin:    Findings: No erythema or rash.  Neurological:     Mental Status: She is alert.  Psychiatric:        Mood and Affect: Mood normal.        Behavior: Behavior normal.         Outpatient Encounter Medications as of 09/29/2023  Medication Sig   albuterol (VENTOLIN HFA) 108 (90 Base) MCG/ACT inhaler Inhale 2 puffs into the lungs every 6 (six) hours as needed for wheezing or shortness of breath.   amLODipine (NORVASC) 10 MG tablet Take 1 tablet (10 mg total) by mouth daily.   ASPIRIN 81 PO Take 1 tablet by mouth daily.   carvedilol (COREG) 12.5 MG tablet Take 1 tablet (12.5 mg total) by mouth 2 (two) times daily.   fluticasone (FLOVENT HFA) 110 MCG/ACT inhaler Inhale 2 puffs into the lungs in the morning and at bedtime.   folic acid (FOLVITE) 1 MG tablet Take 1 mg by mouth daily.   hydrochlorothiazide (MICROZIDE) 12.5 MG capsule Take 1 capsule (12.5 mg total) by mouth daily.   latanoprost (XALATAN) 0.005 % ophthalmic solution Place 1 drop into both eyes at bedtime.    potassium chloride (KLOR-CON M10) 10 MEQ tablet Take 2 tablets (20 mEq total) by mouth 2 (two) times daily.   rosuvastatin (CRESTOR) 10 MG tablet  Take 1 tablet (10 mg total) by mouth daily.   sulfaSALAzine (AZULFIDINE) 500 MG tablet Take 1,500 mg by mouth 2 (two) times daily. 3 tabs   timolol (TIMOPTIC) 0.5 % ophthalmic solution Place 1 drop into both eyes 2 (two) times daily.   traMADol (ULTRAM) 50 MG tablet Take 1 tablet (50 mg total) by mouth 2 (two) times daily. Each refill must last 30 days.   triamcinolone cream (KENALOG) 0.1 % Apply 1 Application topically as needed (prn). As needed   Vitamin D, Ergocalciferol, (DRISDOL) 1.25 MG (50000 UNIT) CAPS capsule Take 1 capsule (50,000 Units total) by mouth every 7 (seven) days.   [DISCONTINUED] benzonatate (TESSALON) 200 MG capsule Take 1 capsule (200 mg total) by mouth 3 (three) times daily as needed for cough.   [DISCONTINUED] magnesium oxide (MAG-OX) 400 (240 Mg) MG tablet Take 1 tablet (400 mg total) by mouth daily.   [DISCONTINUED] predniSONE (DELTASONE) 20 MG tablet Take 2 tablets (40 mg total) by mouth daily.   [DISCONTINUED] promethazine-dextromethorphan (PROMETHAZINE-DM) 6.25-15 MG/5ML syrup Take 5 mLs by mouth 4 (four) times daily as needed.   No facility-administered encounter medications on file as of 09/29/2023.     Lab Results  Component Value Date   WBC 6.6 09/29/2023   HGB 11.6 (L) 09/29/2023   HCT 34.7 (L) 09/29/2023   PLT 178.0 09/29/2023   GLUCOSE 85 09/29/2023   CHOL 176 07/13/2023   TRIG 129.0 07/13/2023   HDL 68.10 07/13/2023   LDLDIRECT 132.5 05/06/2013   LDLCALC 82 07/13/2023   ALT 10 07/13/2023   AST 20 07/13/2023   NA  144 09/29/2023   K 3.6 09/29/2023   CL 105 09/29/2023   CREATININE 0.90 09/29/2023   BUN 14 09/29/2023   CO2 32 09/29/2023   TSH 2.22 05/13/2023   HGBA1C 5.7 10/19/2012       Assessment & Plan:  Leg cramps Assessment & Plan: Discussed staying hydrated. Stretches. Continue potassium supplements. Continues taking magnesium. Check electrolytes and magnesium level today.   Orders: -     Sedimentation rate -      Magnesium  Anemia, unspecified type Assessment & Plan: Follow cbc.    Hypercholesterolemia Assessment & Plan: Continue crestor. Follow lipid panel and liver function tests.   Orders: -     CBC with Differential/Platelet -     Basic metabolic panel  Primary hypertension Assessment & Plan: Continues on carvedilol, hydrochlorothiazide and reports she is holding amlodipine. Need to confirm. Blood pressure as outlined. Follow pressures. Follow metabolic panel.    Vitamin D deficiency Assessment & Plan: On vitamin D supplements. Prescription vitamin D supplements. Check vitamin D level today.   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 Has seen multiple specialist and had several procedures. No relief. Has had chronic pain and swelling. Being followed by pain clinic.    Crohn's disease of large intestine without complication [facility]) Assessment & Plan: Bowels stable. Has been followed by GI ([facility]). Continue sulfasalazine.    RSV (respiratory syncytial virus infection) Assessment & Plan: Recent infection. Symptoms improved/resolving. Persistent fatigue. Check labs as outlined. Energy is improving. Follow.    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 Doing well. Stable.       Dale Twisp, MD

## 2023-09-29 NOTE — Telephone Encounter (Signed)
Pt was wanting to move her appointment because they are calling for snow. Advised patient that we have not decided if the office will be closed yet but if they do, some one will be calling to get her a new appointment. Patient does not want to wait until March or April to be seen. Advised that if we have to move patients, someone will be calling.

## 2023-09-30 ENCOUNTER — Ambulatory Visit: Payer: HMO | Admitting: Internal Medicine

## 2023-10-01 ENCOUNTER — Other Ambulatory Visit: Payer: Self-pay

## 2023-10-01 ENCOUNTER — Telehealth: Payer: Self-pay

## 2023-10-01 MED ORDER — MAGNESIUM OXIDE -MG SUPPLEMENT 400 (240 MG) MG PO TABS: ORAL_TABLET | ORAL | 0 refills | Status: DC

## 2023-10-01 NOTE — Telephone Encounter (Signed)
Awaiting response from result note. Messages attached below:   Kristie Cowman, CMA 09/30/2023  3:28 PM EST Back to Top    Pt notified. Pt stated she is taking 2 400mg  magnesium a day   Dale Heath, MD 09/30/2023  5:21 AM EST     Please call and notify - hgb is stable. Still slightly decreased, but stable.  Vitamin D level has improved.  I had given her a prescription vitamin D. She has two more left from her current refill.  Notify her to complete her current prescription and instead of getting the vitamin D prescription refilled, I would like for her to start vitamin D3 2000 units per day. This is over the counter. (Let me know if any questions or confusion). Kidney function ok. Magnesium level is low. Need to confirm dose of magnesium she is currently taking. Will need to adjust dose. Inflammation marker wnl.

## 2023-10-01 NOTE — Telephone Encounter (Signed)
Pt aware. Rx sent in. Will check at upcoming appt.

## 2023-10-01 NOTE — Telephone Encounter (Signed)
Copied from CRM [id number]. Topic: Clinical - Medical Advice >> Oct 01, 2023  2:43 PM Ellen Drake wrote: Reason for CRM: Patient was wondering if there were any medications that needed to be refilled due to her labs that came in on the 18th.

## 2023-10-01 NOTE — Telephone Encounter (Signed)
Have her increase magnesium 400mg  - take two tablets in am and one in pm.  Recheck magnesium level in the next 2 weeks.

## 2023-10-03 ENCOUNTER — Encounter: Payer: Self-pay | Admitting: Internal Medicine

## 2023-10-03 NOTE — Assessment & Plan Note (Signed)
 Continues on carvedilol, hydrochlorothiazide and reports she is holding amlodipine. Need to confirm. Blood pressure as outlined. Follow pressures. Follow metabolic panel.

## 2023-10-03 NOTE — Assessment & Plan Note (Signed)
 Bowels stable. Has been followed by GI ([facility]). Continue sulfasalazine.

## 2023-10-03 NOTE — Assessment & Plan Note (Signed)
 Recent infection. Symptoms improved/resolving. Persistent fatigue. Check labs as outlined. Energy is improving. Follow.

## 2023-10-03 NOTE — Assessment & Plan Note (Signed)
 S/p pacemaker placement 03/2023. Doing well. Stable.

## 2023-10-03 NOTE — Assessment & Plan Note (Signed)
 Continue crestor. Follow lipid panel and liver function tests.

## 2023-10-03 NOTE — Assessment & Plan Note (Signed)
 Persistent increased pain. Has seen multiple specialist and had several procedures. No relief. Has had chronic pain and swelling. Being followed by pain clinic.

## 2023-10-03 NOTE — Assessment & Plan Note (Signed)
 Follow cbc.

## 2023-10-03 NOTE — Assessment & Plan Note (Signed)
 On vitamin D supplements. Prescription vitamin D supplements. Check vitamin D level today.

## 2023-10-03 NOTE — Assessment & Plan Note (Signed)
 Discussed staying hydrated. Stretches. Continue potassium supplements. Continues taking magnesium. Check electrolytes and magnesium level today.

## 2023-10-08 ENCOUNTER — Other Ambulatory Visit: Payer: Self-pay | Admitting: Nurse Practitioner

## 2023-10-08 DIAGNOSIS — B338 Other specified viral diseases: Secondary | ICD-10-CM

## 2023-10-13 DIAGNOSIS — H401133 Primary open-angle glaucoma, bilateral, severe stage: Secondary | ICD-10-CM | POA: Diagnosis not present

## 2023-10-15 ENCOUNTER — Ambulatory Visit: Payer: Medicare HMO | Admitting: Internal Medicine

## 2023-10-15 DIAGNOSIS — K5010 Crohn's disease of large intestine without complications: Secondary | ICD-10-CM | POA: Diagnosis not present

## 2023-10-15 DIAGNOSIS — E559 Vitamin D deficiency, unspecified: Secondary | ICD-10-CM

## 2023-10-15 DIAGNOSIS — G8929 Other chronic pain: Secondary | ICD-10-CM

## 2023-10-15 DIAGNOSIS — R051 Acute cough: Secondary | ICD-10-CM

## 2023-10-15 DIAGNOSIS — M1990 Unspecified osteoarthritis, unspecified site: Secondary | ICD-10-CM | POA: Diagnosis not present

## 2023-10-15 DIAGNOSIS — Z96651 Presence of right artificial knee joint: Secondary | ICD-10-CM | POA: Diagnosis not present

## 2023-10-15 DIAGNOSIS — E7800 Pure hypercholesterolemia, unspecified: Secondary | ICD-10-CM

## 2023-10-15 DIAGNOSIS — M25561 Pain in right knee: Secondary | ICD-10-CM | POA: Diagnosis not present

## 2023-10-15 DIAGNOSIS — M25569 Pain in unspecified knee: Secondary | ICD-10-CM

## 2023-10-15 DIAGNOSIS — I10 Essential (primary) hypertension: Secondary | ICD-10-CM | POA: Diagnosis not present

## 2023-10-15 DIAGNOSIS — D649 Anemia, unspecified: Secondary | ICD-10-CM | POA: Diagnosis not present

## 2023-10-15 DIAGNOSIS — I441 Atrioventricular block, second degree: Secondary | ICD-10-CM | POA: Diagnosis not present

## 2023-10-15 LAB — SEDIMENTATION RATE: Sed Rate: 27 mm/h (ref 0–30)

## 2023-10-15 LAB — BASIC METABOLIC PANEL
BUN: 16 mg/dL (ref 6–23)
CO2: 29 meq/L (ref 19–32)
Calcium: 9.8 mg/dL (ref 8.4–10.5)
Chloride: 106 meq/L (ref 96–112)
Creatinine, Ser: 0.86 mg/dL (ref 0.40–1.20)
GFR: 64.94 mL/min (ref >60.00)
Glucose, Bld: 91 mg/dL (ref 70–99)
Potassium: 4 meq/L (ref 3.5–5.1)
Sodium: 141 meq/L (ref 135–145)

## 2023-10-15 LAB — LIPID PANEL
Cholesterol: 169 mg/dL (ref 0–200)
HDL: 72.3 mg/dL (ref >39.00)
LDL Cholesterol: 76 mg/dL (ref 0–99)
NonHDL: 97.17
Total CHOL/HDL Ratio: 2
Triglycerides: 107 mg/dL (ref 0.0–149.0)
VLDL: 21.4 mg/dL (ref 0.0–40.0)

## 2023-10-15 LAB — CBC WITH DIFFERENTIAL/PLATELET
Basophils Absolute: 0 10*3/uL (ref 0.0–0.1)
Basophils Relative: 0.3 % (ref 0.0–3.0)
Eosinophils Absolute: 0 10*3/uL (ref 0.0–0.7)
Eosinophils Relative: 0.7 % (ref 0.0–5.0)
HCT: 36.3 % (ref 36.0–46.0)
Hemoglobin: 12.2 g/dL (ref 12.0–15.0)
Lymphocytes Relative: 26.3 % (ref 12.0–46.0)
Lymphs Abs: 1.9 10*3/uL (ref 0.7–4.0)
MCHC: 33.5 g/dL (ref 30.0–36.0)
MCV: 97.3 fl (ref 78.0–100.0)
Monocytes Absolute: 0.8 10*3/uL (ref 0.1–1.0)
Monocytes Relative: 11 % (ref 3.0–12.0)
Neutro Abs: 4.6 10*3/uL (ref 1.4–7.7)
Neutrophils Relative %: 61.7 % (ref 43.0–77.0)
Platelets: 179 10*3/uL (ref 150.0–400.0)
RBC: 3.73 Mil/uL — ABNORMAL LOW (ref 3.87–5.11)
RDW: 14.2 % (ref 11.5–15.5)
WBC: 7.4 10*3/uL (ref 4.0–10.5)

## 2023-10-15 LAB — HEPATIC FUNCTION PANEL
ALT: 9 U/L (ref 0–35)
AST: 20 U/L (ref 0–37)
Albumin: 4.2 g/dL (ref 3.5–5.2)
Alkaline Phosphatase: 57 U/L (ref 39–117)
Bilirubin, Direct: 0.1 mg/dL (ref 0.0–0.3)
Total Bilirubin: 0.3 mg/dL (ref 0.2–1.2)
Total Protein: 7.1 g/dL (ref 6.0–8.3)

## 2023-10-15 LAB — MAGNESIUM: Magnesium: 2 mg/dL (ref 1.5–2.5)

## 2023-10-15 LAB — VITAMIN D 25 HYDROXY (VIT D DEFICIENCY, FRACTURES): VITD: 40.55 ng/mL (ref 30.00–100.00)

## 2023-10-15 LAB — C-REACTIVE PROTEIN: CRP: 1.7 mg/dL (ref 0.5–20.0)

## 2023-10-15 NOTE — Progress Notes (Signed)
 Subjective:    Patient ID: Ellen Drake, female    DOB: [birth year], 78 y.o.   MRN: [id number]  Patient here for  Chief Complaint  Patient presents with   Medical Management of Chronic Issues    HPI Here for a scheduled follow up - follow up regarding hypertension and increased fatigue. Saw Dr Ernest Pine today for persistent knee pain and swelling. Requested labs, including esr, crp and cbc. Pending results, may consider aspiration of the right knee. States otherwise she is feeling better. Cough better. No increased sob. Eating. No abdominal pain or bowel change.    Past Medical History:  Diagnosis Date   Anemia    reslved   Colon polyp 2009   Crohn's disease (HCC)    Diverticulosis    Fibrocystic breast disease    Herniated nucleus pulposus of lumbosacral region    History of chicken pox    Hypercholesterolemia    Hyperlipidemia    Hypertension    Osteoarthritis    Pulmonary nodule seen on imaging study 2004   resolved 2007   Past Surgical History:  Procedure Laterality Date   anterior cervical discectomy and fusion  1998   Dr Alvy Beal SURGERY  1992   ruptured disc (Dr Gerrit Heck)   BREAST BIOPSY Left    neg-no scar seen   CATARACT EXTRACTION W/PHACO Right 02/09/2019   Procedure: CATARACT EXTRACTION PHACO AND INTRAOCULAR LENS PLACEMENT (IOC) [place] DUAL BLADE GONIOTOMY  RIGHT HEALON 5, VISION BLUE;  Surgeon: Lockie Mola, MD;  Location: [facility];  Service: Ophthalmology;  Laterality: Right;   COLECTOMY  2003   COLON SURGERY  1998   COLONOSCOPY  2009   COLONOSCOPY W/ BIOPSIES  11/30/2012   Procedure: COLONOSCOPY W/BIOPSY; Surgeon: Benjie Karvonen, MD; Location: [facility]; Service: Gastroenterology;;   COLONOSCOPY W/ BIOPSIES  04/15/2016   Procedure: Colonoscopy ; Surgeon: Benjie Karvonen, MD; Location: [facility]; Service: Gastroenterology; Laterality: N/A;   KNEE ARTHROSCOPY W/ SYNOVECTOMY Right 03/10/2014   limited    KNEE SURGERY Right 1/16   [facility] (Dr. Ophelia Shoulder)   PACEMAKER IMPLANT N/A 03/19/2023   Procedure: PACEMAKER IMPLANT;  Surgeon: Regan Lemming, MD;  Location: [facility];  Service: Cardiovascular;  Laterality: N/A;   POSTERIOR LAMINECTOMY / DECOMPRESSION LUMBAR SPINE  1999   REPLACEMENT TOTAL KNEE     ROTATOR CUFF REPAIR Left 2007   acute open; accident   Family History  Problem Relation Age of Onset   CVA Mother    Hypertension Mother    Hypertension Sister        brain tumor   Breast cancer Neg Hx    Colon cancer Neg Hx    Social History   Socioeconomic History   Marital status: Married    Spouse name: Not on file   Number of children: 0   Years of education: 12   Highest education level: Not on file  Occupational History   Not on file  Tobacco Use   Smoking status: Former    Current packs/day: 0.00    Types: Cigarettes    Quit date: 09/10/2009    Years since quitting: 14.1   Smokeless tobacco: Never  Vaping Use   Vaping status: Never Used  Substance and Sexual Activity   Alcohol use: Yes    Alcohol/week: 1.0 standard drink of alcohol    Types: 1 Glasses of wine per week    Comment: wine occasional; socially  Drug use: No   Sexual activity: Yes  Other Topics Concern   Not on file  Social History Narrative   married   Social Drivers of Corporate investment banker Strain: Low Risk  (10/15/2023)   Received from [facility]   Overall Financial Resource Strain (CARDIA)    Difficulty of Paying Living Expenses: Not hard at all  Food Insecurity: No Food Insecurity (10/15/2023)   Received from [facility]   Hunger Vital Sign    Worried About Running Out of Food in the Last Year: Never true    Ran Out of Food in the Last Year: Never true  Transportation Needs: No Transportation Needs (10/15/2023)   Received from [facility] - Transportation    In the past 12 months, has lack of  transportation kept you from medical appointments or from getting medications?: No    Lack of Transportation (Non-Medical): No  Physical Activity: Inactive (07/24/2023)   Exercise Vital Sign    Days of Exercise per Week: 0 days    Minutes of Exercise per Session: 0 min  Stress: No Stress Concern Present (07/24/2023)   [employer] of Occupational Health - Occupational Stress Questionnaire    Feeling of Stress : Not at all  Social Connections: Socially Integrated (07/24/2023)   Social Connection and Isolation Panel [NHANES]    Frequency of Communication with Friends and Family: More than three times a week    Frequency of Social Gatherings with Friends and Family: Twice a week    Attends Religious Services: More than 4 times per year    Active Member of [employer] or Organizations: Yes    Attends Engineer, structural: More than 4 times per year    Marital Status: Married     Review of Systems  Constitutional:  Negative for appetite change and unexpected weight change.  HENT:  Negative for congestion and sinus pressure.   Respiratory:  Negative for chest tightness and shortness of breath.        Cough much improved - resolved.   Cardiovascular:  Negative for chest pain and palpitations.       Knee swelling - chronic.   Gastrointestinal:  Negative for abdominal pain, diarrhea, nausea and vomiting.  Genitourinary:  Negative for difficulty urinating and dysuria.  Musculoskeletal:  Negative for joint swelling and myalgias.  Skin:  Negative for color change and rash.  Neurological:  Negative for dizziness and headaches.  Psychiatric/Behavioral:  Negative for agitation and dysphoric mood.        Objective:     BP 138/78   Pulse 74   Resp 16   Ht 5\' 5"  (1.651 m)   Wt 148 lb 6.4 oz (67.3 kg)   LMP 08/13/1995   SpO2 98%   BMI 24.70 kg/m  Wt Readings from Last 3 Encounters:  10/15/23 148 lb 6.4 oz (67.3 kg)  09/29/23 152 lb (68.9 kg)  09/11/23 147 lb 3.2 oz (66.8 kg)     Physical Exam Vitals reviewed.  Constitutional:      General: She is not in acute distress.    Appearance: Normal appearance.  HENT:     Head: Normocephalic and atraumatic.     Right Ear: External ear normal.     Left Ear: External ear normal.     Mouth/Throat:     Pharynx: No oropharyngeal exudate or posterior oropharyngeal erythema.  Eyes:     General: No scleral icterus.  Right eye: No discharge.        Left eye: No discharge.     Conjunctiva/sclera: Conjunctivae normal.  Neck:     Thyroid: No thyromegaly.  Cardiovascular:     Rate and Rhythm: Normal rate and regular rhythm.  Pulmonary:     Effort: No respiratory distress.     Breath sounds: Normal breath sounds. No wheezing.  Abdominal:     General: Bowel sounds are normal.     Palpations: Abdomen is soft.     Tenderness: There is no abdominal tenderness.  Musculoskeletal:     Cervical back: Neck supple. No tenderness.     Comments: Chronic right knee swelling.   Lymphadenopathy:     Cervical: No cervical adenopathy.  Skin:    Findings: No erythema or rash.  Neurological:     Mental Status: She is alert.  Psychiatric:        Mood and Affect: Mood normal.        Behavior: Behavior normal.         Outpatient Encounter Medications as of 10/15/2023  Medication Sig   albuterol (VENTOLIN HFA) 108 (90 Base) MCG/ACT inhaler Inhale 2 puffs into the lungs every 6 (six) hours as needed for wheezing or shortness of breath.   amLODipine (NORVASC) 10 MG tablet Take 1 tablet (10 mg total) by mouth daily.   ASPIRIN 81 PO Take 1 tablet by mouth daily.   carvedilol (COREG) 12.5 MG tablet Take 1 tablet (12.5 mg total) by mouth 2 (two) times daily.   fluticasone (FLOVENT HFA) 110 MCG/ACT inhaler Inhale 2 puffs into the lungs in the morning and at bedtime.   folic acid (FOLVITE) 1 MG tablet Take 1 mg by mouth daily.   hydrochlorothiazide (MICROZIDE) 12.5 MG capsule Take 1 capsule (12.5 mg total) by mouth daily.    latanoprost (XALATAN) 0.005 % ophthalmic solution Place 1 drop into both eyes at bedtime.    magnesium oxide (MAG-OX) 400 (240 Mg) MG tablet Take two tablets by mouth every morning and 1 tablet every evening   potassium chloride (KLOR-CON M10) 10 MEQ tablet Take 2 tablets (20 mEq total) by mouth 2 (two) times daily.   rosuvastatin (CRESTOR) 10 MG tablet Take 1 tablet (10 mg total) by mouth daily.   sulfaSALAzine (AZULFIDINE) 500 MG tablet Take 1,500 mg by mouth 2 (two) times daily. 3 tabs   timolol (TIMOPTIC) 0.5 % ophthalmic solution Place 1 drop into both eyes 2 (two) times daily.   traMADol (ULTRAM) 50 MG tablet Take 1 tablet (50 mg total) by mouth 2 (two) times daily. Each refill must last 30 days.   triamcinolone cream (KENALOG) 0.1 % Apply 1 Application topically as needed (prn). As needed   Vitamin D, Ergocalciferol, (DRISDOL) 1.25 MG (50000 UNIT) CAPS capsule Take 1 capsule (50,000 Units total) by mouth every 7 (seven) days.   No facility-administered encounter medications on file as of 10/15/2023.     Lab Results  Component Value Date   WBC 7.4 10/15/2023   HGB 12.2 10/15/2023   HCT 36.3 10/15/2023   PLT 179.0 10/15/2023   GLUCOSE 91 10/15/2023   CHOL 169 10/15/2023   TRIG 107.0 10/15/2023   HDL 72.30 10/15/2023   LDLDIRECT 132.5 05/06/2013   LDLCALC 76 10/15/2023   ALT 9 10/15/2023   AST 20 10/15/2023   NA 141 10/15/2023   K 4.0 10/15/2023   CL 106 10/15/2023   CREATININE 0.86 10/15/2023   BUN 16 10/15/2023   CO2  29 10/15/2023   TSH 2.22 05/13/2023   HGBA1C 5.7 10/19/2012       Assessment & Plan:  Hypomagnesemia -     Magnesium  Knee pain, unspecified chronicity, unspecified laterality -     Sedimentation rate -     C-reactive protein -     CBC with Differential/Platelet  Inflammation of joint -     Sedimentation rate -     C-reactive protein -     CBC with Differential/Platelet  Hypercholesterolemia Assessment & Plan: Continue crestor. Follow lipid panel  and liver function tests.   Orders: -     Hepatic function panel -     Lipid panel  Primary hypertension Assessment & Plan: Continues carvedilol, hydrochlorothiazide. Per note, taking amlodipine. Blood pressure doing well. Follow pressures.  Follow metabolic panel.   Orders: -     Basic metabolic panel  Vitamin D deficiency Assessment & Plan: Check vitamin D level today.   Orders: -     VITAMIN D 25 Hydroxy (Vit-D Deficiency, Fractures)  Anemia, unspecified type Assessment & Plan: Follow cbc.    Chronic knee pain (1ry area of Pain) (Right) Assessment & Plan: Saw Dr Ernest Pine today. Chronic pain and swelling. Recommended check esr, crp and cbc.  Continue f/u with ortho.    Acute cough Assessment & Plan: Overall doing better. Cough better/resolved. Follow. Feeling better.    Crohn's disease of large intestine without complication [facility]) Assessment & Plan: Bowels stable. Doing well. Has been followed by GI ([facility]). Continue sulfasalazine.    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 Doing well. Stable.       Dale , MD

## 2023-10-18 ENCOUNTER — Encounter: Payer: Self-pay | Admitting: Internal Medicine

## 2023-10-18 NOTE — Assessment & Plan Note (Signed)
 Follow cbc.

## 2023-10-18 NOTE — Assessment & Plan Note (Signed)
 Check vitamin D level today

## 2023-10-18 NOTE — Assessment & Plan Note (Signed)
 Bowels stable. Doing well. Has been followed by GI ([facility]). Continue sulfasalazine.

## 2023-10-18 NOTE — Assessment & Plan Note (Signed)
 Overall doing better. Cough better/resolved. Follow. Feeling better.

## 2023-10-18 NOTE — Assessment & Plan Note (Signed)
 Continue crestor. Follow lipid panel and liver function tests.

## 2023-10-18 NOTE — Assessment & Plan Note (Signed)
 Saw Dr Ernest Pine today. Chronic pain and swelling. Recommended check esr, crp and cbc.  Continue f/u with ortho.

## 2023-10-18 NOTE — Assessment & Plan Note (Signed)
 S/p pacemaker placement 03/2023. Doing well. Stable.

## 2023-10-18 NOTE — Assessment & Plan Note (Signed)
 Continues carvedilol, hydrochlorothiazide. Per note, taking amlodipine. Blood pressure doing well. Follow pressures.  Follow metabolic panel.

## 2023-10-20 NOTE — Progress Notes (Signed)
 Remote pacemaker transmission.

## 2023-10-20 NOTE — Addendum Note (Signed)
 Addended by: Elease Etienne A on: 10/20/2023 12:25 PM   Modules accepted: Orders

## 2023-10-22 ENCOUNTER — Other Ambulatory Visit: Payer: Self-pay

## 2023-10-22 ENCOUNTER — Other Ambulatory Visit (HOSPITAL_COMMUNITY): Payer: Self-pay

## 2023-10-22 DIAGNOSIS — H401133 Primary open-angle glaucoma, bilateral, severe stage: Secondary | ICD-10-CM | POA: Diagnosis not present

## 2023-10-22 DIAGNOSIS — H2512 Age-related nuclear cataract, left eye: Secondary | ICD-10-CM | POA: Diagnosis not present

## 2023-10-22 DIAGNOSIS — Z961 Presence of intraocular lens: Secondary | ICD-10-CM | POA: Diagnosis not present

## 2023-10-22 DIAGNOSIS — H538 Other visual disturbances: Secondary | ICD-10-CM | POA: Diagnosis not present

## 2023-10-22 MED ORDER — LATANOPROST 0.005 % OP SOLN
1.0000 [drp] | Freq: Every day | OPHTHALMIC | 4 refills | Status: AC
  Filled 2023-10-22: qty 7.5, 75d supply, fill #0

## 2023-10-26 DIAGNOSIS — G8929 Other chronic pain: Secondary | ICD-10-CM | POA: Diagnosis not present

## 2023-10-26 DIAGNOSIS — M25561 Pain in right knee: Secondary | ICD-10-CM | POA: Diagnosis not present

## 2023-10-26 DIAGNOSIS — M25461 Effusion, right knee: Secondary | ICD-10-CM | POA: Diagnosis not present

## 2023-10-26 DIAGNOSIS — Z96651 Presence of right artificial knee joint: Secondary | ICD-10-CM | POA: Diagnosis not present

## 2023-10-27 ENCOUNTER — Other Ambulatory Visit: Payer: Self-pay

## 2023-10-27 ENCOUNTER — Encounter: Payer: Self-pay | Admitting: Ophthalmology

## 2023-10-28 ENCOUNTER — Encounter: Payer: Self-pay | Admitting: Ophthalmology

## 2023-11-02 DIAGNOSIS — H2512 Age-related nuclear cataract, left eye: Secondary | ICD-10-CM | POA: Diagnosis not present

## 2023-11-02 DIAGNOSIS — H401123 Primary open-angle glaucoma, left eye, severe stage: Secondary | ICD-10-CM | POA: Diagnosis not present

## 2023-11-10 ENCOUNTER — Encounter: Payer: Self-pay | Admitting: Ophthalmology

## 2023-11-10 DIAGNOSIS — G8929 Other chronic pain: Secondary | ICD-10-CM | POA: Diagnosis not present

## 2023-11-10 DIAGNOSIS — Z96651 Presence of right artificial knee joint: Secondary | ICD-10-CM | POA: Diagnosis not present

## 2023-11-10 DIAGNOSIS — M25561 Pain in right knee: Secondary | ICD-10-CM | POA: Diagnosis not present

## 2023-11-10 NOTE — Anesthesia Preprocedure Evaluation (Addendum)
 Anesthesia Evaluation  Patient identified by MRN, date of birth, ID band Patient awake    Reviewed: Allergy & Precautions, H&P , NPO status , Patient's Chart, lab work & pertinent test results  Airway Mallampati: III  TM Distance: >3 FB Neck ROM: Full    Dental no notable dental hx.    Pulmonary neg pulmonary ROS, former smoker   Pulmonary exam normal breath sounds clear to auscultation       Cardiovascular hypertension, negative cardio ROS Normal cardiovascular exam+ dysrhythmias + pacemaker  Rhythm:Regular Rate:Normal  Pacemaker check 09-18-23  01-17-23 1. Intracavitary gradient. Peak gradient 1.7 m/s. Peak gradient 5.5 mmHg.  Left ventricular ejection fraction, by estimation, is 60 to 65%. The left  ventricle has normal function. The left ventricle has no regional wall  motion abnormalities. Left  ventricular diastolic parameters are consistent with Grade I diastolic  dysfunction (impaired relaxation).   2. Right ventricular systolic function is normal. The right ventricular  size is normal.   3. Left atrial size was mildly dilated.   4. The mitral valve is normal in structure. Mild mitral valve  regurgitation. No evidence of mitral stenosis.   5. Aortic outflow gradients are mildly elevated. The aortic valve appears  normal. Cannot exclude a subvalvular or supravalvular membrane. The aortic  valve is tricuspid. Aortic valve regurgitation is not visualized. Mild  aortic valve stenosis. Aortic  valve area, by VTI measures 2.03 cm. Aortic valve mean gradient measures  10.0 mmHg. Aortic valve Vmax measures 2.23 m/s.   6. The inferior vena cava is normal in size with greater than 50%  respiratory variability, suggesting right atrial pressure of 3 mmHg.     Neuro/Psych negative neurological ROS  negative psych ROS   GI/Hepatic negative GI ROS, Neg liver ROS,,,  Endo/Other  negative endocrine ROS    Renal/GU negative  Renal ROS  negative genitourinary   Musculoskeletal negative musculoskeletal ROS (+) Arthritis ,    Abdominal   Peds negative pediatric ROS (+)  Hematology negative hematology ROS (+) Blood dyscrasia, anemia   Anesthesia Other Findings Second degree AV block, Mobitz type II  Hypertension  Hypercholesterolemia Crohn's disease (HCC)  Fibrocystic breast disease Diverticulosis  Colon polyp Hyperlipidemia  Anemia Pulmonary nodule seen on imaging study  Osteoarthritis Herniated nucleus pulposus of lumbosacral region Grade I diastolic dysfunction  Mild mitral regurgitation by prior echocardiogram Pacemaker      Reproductive/Obstetrics negative OB ROS                              Anesthesia Physical Anesthesia Plan  ASA: 3  Anesthesia Plan: MAC   Post-op Pain Management:    Induction: Intravenous  PONV Risk Score and Plan:   Airway Management Planned: Natural Airway and Nasal Cannula  Additional Equipment:   Intra-op Plan:   Post-operative Plan:   Informed Consent: I have reviewed the patients History and Physical, chart, labs and discussed the procedure including the risks, benefits and alternatives for the proposed anesthesia with the patient or authorized representative who has indicated his/her understanding and acceptance.     Dental Advisory Given  Plan Discussed with: Anesthesiologist, CRNA and Surgeon  Anesthesia Plan Comments: (Patient consented for risks of anesthesia including but not limited to:  - adverse reactions to medications - damage to eyes, teeth, lips or other oral mucosa - nerve damage due to positioning  - sore throat or hoarseness - Damage to heart, brain, nerves, lungs,  other parts of body or loss of life  Patient voiced understanding and assent.)         Anesthesia Quick Evaluation

## 2023-11-10 NOTE — Discharge Instructions (Signed)

## 2023-11-11 ENCOUNTER — Encounter
Admission: RE | Disposition: A | Discharge status: Home / Self Care | Payer: Self-pay | Source: Home / Self Care | Attending: Ophthalmology

## 2023-11-11 ENCOUNTER — Ambulatory Visit: Payer: Self-pay | Admitting: Anesthesiology

## 2023-11-11 ENCOUNTER — Other Ambulatory Visit: Payer: Self-pay

## 2023-11-11 ENCOUNTER — Ambulatory Visit
Admission: RE | Admit: 2023-11-11 | Discharge: 2023-11-11 | Disposition: A | Discharge status: Home / Self Care | Attending: Ophthalmology | Admitting: Ophthalmology

## 2023-11-11 DIAGNOSIS — H401123 Primary open-angle glaucoma, left eye, severe stage: Secondary | ICD-10-CM | POA: Diagnosis not present

## 2023-11-11 DIAGNOSIS — Z87891 Personal history of nicotine dependence: Secondary | ICD-10-CM | POA: Diagnosis not present

## 2023-11-11 DIAGNOSIS — H401122 Primary open-angle glaucoma, left eye, moderate stage: Secondary | ICD-10-CM | POA: Diagnosis not present

## 2023-11-11 DIAGNOSIS — I10 Essential (primary) hypertension: Secondary | ICD-10-CM | POA: Diagnosis not present

## 2023-11-11 DIAGNOSIS — H2512 Age-related nuclear cataract, left eye: Secondary | ICD-10-CM | POA: Insufficient documentation

## 2023-11-11 HISTORY — PX: CATARACT EXTRACTION W/PHACO: SHX586

## 2023-11-11 HISTORY — DX: Nonrheumatic aortic (valve) stenosis: I35.0

## 2023-11-11 HISTORY — DX: Other ill-defined heart diseases: I51.89

## 2023-11-11 HISTORY — DX: Nonrheumatic mitral (valve) insufficiency: I34.0

## 2023-11-11 HISTORY — DX: Atrioventricular block, second degree: I44.1

## 2023-11-11 HISTORY — DX: Presence of cardiac pacemaker: Z95.0

## 2023-11-11 SURGERY — PHACOEMULSIFICATION, CATARACT, WITH IOL INSERTION
Anesthesia: Monitor Anesthesia Care | Site: Eye | Laterality: Left

## 2023-11-11 MED ORDER — MIDAZOLAM HCL 2 MG/2ML IJ SOLN
INTRAMUSCULAR | Status: DC | PRN
  Administered 2023-11-11: 1 mg via INTRAVENOUS

## 2023-11-11 MED ORDER — TETRACAINE HCL 0.5 % OP SOLN
1.0000 [drp] | OPHTHALMIC | Status: DC | PRN
  Administered 2023-11-11 (×3): 1 [drp] via OPHTHALMIC

## 2023-11-11 MED ORDER — SODIUM CHLORIDE 0.9% FLUSH
INTRAVENOUS | Status: DC | PRN
  Administered 2023-11-11: 10 mL via INTRAVENOUS

## 2023-11-11 MED ORDER — ARMC OPHTHALMIC DILATING DROPS
OPHTHALMIC | Status: AC
  Filled 2023-11-11: qty 0.5

## 2023-11-11 MED ORDER — ARMC OPHTHALMIC DILATING DROPS
1.0000 | OPHTHALMIC | Status: DC | PRN
  Administered 2023-11-11 (×3): 1 via OPHTHALMIC

## 2023-11-11 MED ORDER — MIDAZOLAM HCL 2 MG/2ML IJ SOLN
INTRAMUSCULAR | Status: AC
  Filled 2023-11-11: qty 2

## 2023-11-11 MED ORDER — CEFUROXIME OPHTHALMIC INJECTION 1 MG/0.1 ML
INJECTION | OPHTHALMIC | Status: DC | PRN
  Administered 2023-11-11: 1 mg via INTRACAMERAL

## 2023-11-11 MED ORDER — SIGHTPATH DOSE#1 NA CHONDROIT SULF-NA HYALURON 20-15 MG/0.5ML IO SOSY
INTRAOCULAR | Status: DC | PRN
  Administered 2023-11-11: .5 mL via INTRAOCULAR

## 2023-11-11 MED ORDER — FENTANYL CITRATE (PF) 100 MCG/2ML IJ SOLN
INTRAMUSCULAR | Status: DC | PRN
  Administered 2023-11-11: 25 ug via INTRAVENOUS

## 2023-11-11 MED ORDER — SIGHTPATH DOSE#1 BSS IO SOLN
INTRAOCULAR | Status: DC | PRN
  Administered 2023-11-11: 15 mL via INTRAOCULAR

## 2023-11-11 MED ORDER — TETRACAINE HCL 0.5 % OP SOLN
OPHTHALMIC | Status: AC
  Filled 2023-11-11: qty 4

## 2023-11-11 MED ORDER — SIGHTPATH DOSE#1 NA HYALUR & NA CHOND-NA HYALUR IO KIT
PACK | INTRAOCULAR | Status: DC | PRN
  Administered 2023-11-11: 1 via OPHTHALMIC

## 2023-11-11 MED ORDER — LIDOCAINE HCL (PF) 2 % IJ SOLN
INTRAOCULAR | Status: DC | PRN
  Administered 2023-11-11: 2 mL

## 2023-11-11 MED ORDER — SIGHTPATH DOSE#1 BSS IO SOLN
INTRAOCULAR | Status: DC | PRN
  Administered 2023-11-11: 65 mL via OPHTHALMIC

## 2023-11-11 MED ORDER — FENTANYL CITRATE (PF) 100 MCG/2ML IJ SOLN
INTRAMUSCULAR | Status: AC
  Filled 2023-11-11: qty 2

## 2023-11-11 SURGICAL SUPPLY — 13 items
BLADE DUAL KAHOOK SINGLE USE (BLADE) IMPLANT
CATARACT SUITE SIGHTPATH (MISCELLANEOUS) ×1 IMPLANT
CLIP IPRISM (KITS) IMPLANT
FEE CATARACT SUITE SIGHTPATH (MISCELLANEOUS) ×2 IMPLANT
GLOVE BIOGEL PI IND STRL 8 (GLOVE) ×2 IMPLANT
GLOVE SURG LX STRL 7.5 STRW (GLOVE) ×2 IMPLANT
GLOVE SURG PROTEXIS BL SZ6.5 (GLOVE) ×1 IMPLANT
GLOVE SURG SYN 6.5 PF PI BL (GLOVE) ×2 IMPLANT
LENS IOL TECNIS EYHANCE 22.0 (Intraocular Lens) IMPLANT
NDL FILTER BLUNT 18X1 1/2 (NEEDLE) ×2 IMPLANT
NEEDLE FILTER BLUNT 18X1 1/2 (NEEDLE) ×1 IMPLANT
RING MALYGIN 7.0 (MISCELLANEOUS) IMPLANT
SYR 3ML LL SCALE MARK (SYRINGE) ×2 IMPLANT

## 2023-11-11 NOTE — Anesthesia Postprocedure Evaluation (Signed)
 Anesthesia Post Note  Patient: Ellen Drake  Procedure(s) Performed: PHACOEMULSIFICATION, CATARACT, WITH IOL INSERTION 11.75 01:03.7 (Left: Eye) GONIOTOMY (Left: Eye)  Patient location during evaluation: PACU Anesthesia Type: MAC Level of consciousness: awake and alert Pain management: pain level controlled Vital Signs Assessment: post-procedure vital signs reviewed and stable Respiratory status: spontaneous breathing, nonlabored ventilation, respiratory function stable and patient connected to nasal cannula oxygen Cardiovascular status: stable and blood pressure returned to baseline Postop Assessment: no apparent nausea or vomiting Anesthetic complications: no   No notable events documented.   Last Vitals:  Vitals:   11/11/23 1229 11/11/23 1234  BP: 131/74 (!) 153/79  Pulse: 62 60  Resp: 14 19  Temp: 36.5 C   SpO2: 100% 94%    Last Pain:  Vitals:   11/11/23 1234  TempSrc:   PainSc: 0-No pain                 Angus Amini C Calvin Jablonowski

## 2023-11-11 NOTE — Op Note (Signed)
 PREOPERATIVE DIAGNOSIS:  Nuclear sclerotic cataract left eye. 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 Item Serial No. Manufacturer Lot No. LRB No. Used Action  LENS IOL TECNIS EYHANCE 22.0 - [id number] Intraocular Lens LENS IOL TECNIS EYHANCE 22.0 [id number] [employer]  Left 1 Implanted    SURGEON:  Deirdre Evener, MD   ANESTHESIA:  Topical with tetracaine drops augmented with 1% preservative-free intracameral lidocaine.    COMPLICATIONS:  None.   DESCRIPTION OF PROCEDURE:  The patient was identified in the holding room and transported to the operating room and placed in the supine position under the operating microscope.  The left eye was identified as the operative eye and it was prepped and draped in the usual sterile ophthalmic fashion.   A 1 millimeter clear-corneal paracentesis was made at the 5:30 position.  0.5 ml of preservative-free 1% lidocaine was injected into the anterior chamber.  The anterior chamber was filled with Viscoat viscoelastic.  A 2.4 millimeter keratome was used to make a near-clear corneal incision at the 2:30 position. The microscope was adjusted and a gonioprism was used to visulaize the trabecular meshwork.  The [facility] Dual Blade was advanced across the anterior chamber under viscoelastic.  The blade was used to mark the trabecular meshwork at the 7:30 position.  The blade was placed two clock hours clockwise into the meshwork.  Proper postioning was confirmed.  The blade ws  passed counterclockwise through the meshwork to excise approximately two to three clock-hours of trabecular meshwork. A Malyugin ring as placed to expand the pupil to 7mm.  Additional Viscoat was put in the anterior chamber.  A curvilinear capsulorrhexis was made with a cystotome and capsulorrhexis forceps.  Balanced salt solution was used to hydrodissect and hydrodelineate the nucleus.   Phacoemulsification was then used in stop and chop fashion to remove the lens nucleus and epinucleus.  The remaining cortex was then removed using the irrigation and aspiration handpiece. Provisc was then placed into the capsular bag to distend it for lens placement.  A lens was then injected into the capsular bag.  The Malyugin ring was removed. The remaining viscoelastic was aspirated.   Wounds were hydrated with balanced salt solution.  The anterior chamber was inflated to a physiologic pressure with balanced salt solution.  No wound leaks were noted. Cefuroxime 0.1 ml of a 10mg /ml solution was injected into the anterior chamber for a dose of 1 mg of intracameral antibiotic at the completion of the case.  The patient was taken to the recovery room in stable condition without complications of anesthesia or surgery.

## 2023-11-11 NOTE — H&P (Signed)
 [facility]   Primary Care Physician:  Dale Roanoke, MD Ophthalmologist: Dr. Lockie Mola  Pre-Procedure History & Physical: HPI:  Ellen Drake is a 78 y.o. female here for ophthalmic surgery.   Past Medical History:  Diagnosis Date   Anemia    reslved   Colon polyp 2009   Crohn's disease (HCC)    Diverticulosis    Fibrocystic breast disease    Grade I diastolic dysfunction    Herniated nucleus pulposus of lumbosacral region    History of chicken pox    Hypercholesterolemia    Hyperlipidemia    Hypertension    Mild aortic stenosis by prior echocardiogram    Mild mitral regurgitation by prior echocardiogram    Osteoarthritis    Pacemaker    Pulmonary nodule seen on imaging study 2004   resolved 2007   Second degree AV block, Mobitz type II     Past Surgical History:  Procedure Laterality Date   anterior cervical discectomy and fusion  1998   Dr Alvy Beal SURGERY  1992   ruptured disc (Dr Gerrit Heck)   BREAST BIOPSY Left    neg-no scar seen   CATARACT EXTRACTION W/PHACO Right 02/09/2019   Procedure: CATARACT EXTRACTION PHACO AND INTRAOCULAR LENS PLACEMENT (IOC) KAHOOK DUAL BLADE GONIOTOMY  RIGHT HEALON 5, VISION BLUE;  Surgeon: Lockie Mola, MD;  Location: [facility];  Service: Ophthalmology;  Laterality: Right;   COLECTOMY  2003   COLON SURGERY  1998   COLONOSCOPY  2009   COLONOSCOPY W/ BIOPSIES  11/30/2012   Procedure: COLONOSCOPY W/BIOPSY; Surgeon: Benjie Karvonen, MD; Location: [facility]; Service: Gastroenterology;;   COLONOSCOPY W/ BIOPSIES  04/15/2016   Procedure: Colonoscopy ; Surgeon: Benjie Karvonen, MD; Location: [facility]; Service: Gastroenterology; Laterality: N/A;   KNEE ARTHROSCOPY W/ SYNOVECTOMY Right 03/10/2014   limited   KNEE SURGERY Right 1/16   [facility] (Dr. Ophelia Shoulder)   PACEMAKER IMPLANT N/A 03/19/2023   Procedure: PACEMAKER IMPLANT;  Surgeon: Regan Lemming, MD;   Location: [facility];  Service: Cardiovascular;  Laterality: N/A;   POSTERIOR LAMINECTOMY / DECOMPRESSION LUMBAR SPINE  1999   REPLACEMENT TOTAL KNEE     ROTATOR CUFF REPAIR Left 2007   acute open; accident    Prior to Admission medications   Medication Sig Start Date End Date Taking? Authorizing Provider  albuterol (VENTOLIN HFA) 108 (90 Base) MCG/ACT inhaler Inhale 2 puffs into the lungs every 6 (six) hours as needed for wheezing or shortness of breath. 09/01/23  Yes Dale [place], MD  amLODipine (NORVASC) 10 MG tablet Take 1 tablet (10 mg total) by mouth daily. 09/21/23  Yes Dale [place], MD  ASPIRIN 81 PO Take 1 tablet by mouth daily.   Yes [provider]  carvedilol (COREG) 12.5 MG tablet Take 1 tablet (12.5 mg total) by mouth 2 (two) times daily. 09/21/23 09/20/24 Yes Dale [place], MD  folic acid (FOLVITE) 1 MG tablet Take 1 mg by mouth daily.   Yes [provider]  hydrochlorothiazide (MICROZIDE) 12.5 MG capsule Take 1 capsule (12.5 mg total) by mouth daily. 09/21/23  Yes Dale Eads, MD  latanoprost (XALATAN) 0.005 % ophthalmic solution Place 1 drop into both eyes at bedtime. 10/22/23  Yes   magnesium oxide (MAG-OX) 400 (240 Mg) MG tablet Take two tablets by mouth every morning and 1 tablet every evening 10/01/23  Yes Dale [place], MD  potassium chloride (KLOR-CON M10) 10 MEQ tablet Take  2 tablets (20 mEq total) by mouth 2 (two) times daily. 09/21/23  Yes Dale [place], MD  rosuvastatin (CRESTOR) 10 MG tablet Take 1 tablet (10 mg total) by mouth daily. 09/21/23  Yes Dale [place], MD  sulfaSALAzine (AZULFIDINE) 500 MG tablet Take 1,500 mg by mouth 2 (two) times daily. 3 tabs   Yes [provider]  triamcinolone cream (KENALOG) 0.1 % Apply 1 Application topically as needed (prn). As needed 02/06/23  Yes [provider]  Vitamin D, Ergocalciferol, (DRISDOL) 1.25 MG (50000 UNIT) CAPS capsule Take 1 capsule (50,000 Units total) by mouth  every 7 (seven) days. 07/17/23  Yes Dale [place], MD  fluticasone (FLOVENT HFA) 110 MCG/ACT inhaler Inhale 2 puffs into the lungs in the morning and at bedtime. 09/11/23   Bethanie Dicker, NP  latanoprost (XALATAN) 0.005 % ophthalmic solution Place 1 drop into both eyes at bedtime.     [provider]  timolol (TIMOPTIC) 0.5 % ophthalmic solution Place 1 drop into both eyes 2 (two) times daily. 11/26/17   [provider]  traMADol (ULTRAM) 50 MG tablet Take 1 tablet (50 mg total) by mouth 2 (two) times daily. Each refill must last 30 days. 05/27/23 11/23/23  Delano Metz, MD    Allergies as of 10/26/2023 - Review Complete 10/18/2023  Allergen Reaction Noted   Mercaptopurine Nausea Only and Other (See Comments) 01/17/2015   Lyrica [pregabalin]  12/03/2015   Dilaudid [hydromorphone hcl] Rash 12/03/2015    Family History  Problem Relation Age of Onset   CVA Mother    Hypertension Mother    Hypertension Sister        brain tumor   Breast cancer Neg Hx    Colon cancer Neg Hx     Social History   Socioeconomic History   Marital status: Married    Spouse name: Not on file   Number of children: 0   Years of education: 12   Highest education level: Not on file  Occupational History   Not on file  Tobacco Use   Smoking status: Former    Current packs/day: 0.00    Types: Cigarettes    Quit date: 09/10/2009    Years since quitting: 14.1   Smokeless tobacco: Never  Vaping Use   Vaping status: Never Used  Substance and Sexual Activity   Alcohol use: Yes    Alcohol/week: 1.0 standard drink of alcohol    Types: 1 Glasses of wine per week    Comment: wine occasional; socially   Drug use: No   Sexual activity: Yes  Other Topics Concern   Not on file  Social History Narrative   married   Social Drivers of Corporate investment banker Strain: Low Risk  (10/15/2023)   Received from [facility]   Overall Financial Resource Strain (CARDIA)     Difficulty of Paying Living Expenses: Not hard at all  Food Insecurity: No Food Insecurity (10/15/2023)   Received from [facility]   Hunger Vital Sign    Worried About Running Out of Food in the Last Year: Never true    Ran Out of Food in the Last Year: Never true  Transportation Needs: No Transportation Needs (10/15/2023)   Received from [facility] Transportation    In the past 12 months, has lack of transportation kept you from medical appointments or from getting medications?: No    Lack of Transportation (Non-Medical): No  Physical Activity:  Inactive (07/24/2023)   Exercise Vital Sign    Days of Exercise per Week: 0 days    Minutes of Exercise per Session: 0 min  Stress: No Stress Concern Present (07/24/2023)   [employer] of Occupational Health - Occupational Stress Questionnaire    Feeling of Stress : Not at all  Social Connections: Socially Integrated (07/24/2023)   Social Connection and Isolation Panel [NHANES]    Frequency of Communication with Friends and Family: More than three times a week    Frequency of Social Gatherings with Friends and Family: Twice a week    Attends Religious Services: More than 4 times per year    Active Member of [employer] or Organizations: Yes    Attends Engineer, structural: More than 4 times per year    Marital Status: Married  Catering manager Violence: Not At Risk (07/24/2023)   Humiliation, Afraid, Rape, and Kick questionnaire    Fear of Current or Ex-Partner: No    Emotionally Abused: No    Physically Abused: No    Sexually Abused: No    Review of Systems: See HPI, otherwise negative ROS  Physical Exam: BP 131/68   Pulse 62   Temp 97.9 F (36.6 C) (Temporal)   Resp 18   Ht 5\' 5"  (1.651 m)   Wt 67.6 kg   LMP 08/13/1995   SpO2 97%   BMI 24.79 kg/m  General:   Alert,  pleasant and cooperative in NAD Head:  Normocephalic and atraumatic. Lungs:  Clear to auscultation.     Heart:  Regular rate and rhythm.   Impression/Plan: Ellen Drake is here for ophthalmic surgery.  Risks, benefits, limitations, and alternatives regarding ophthalmic surgery have been reviewed with the patient.  Questions have been answered.  All parties agreeable.   Lockie Mola, MD  11/11/2023, 11:01 AM

## 2023-11-11 NOTE — Transfer of Care (Signed)
 Immediate Anesthesia Transfer of Care Note  Patient: Ellen Drake  Procedure(s) Performed: PHACOEMULSIFICATION, CATARACT, WITH IOL INSERTION 11.75 01:03.7 (Left: Eye) GONIOTOMY (Left: Eye)  Patient Location: PACU  Anesthesia Type:MAC  Level of Consciousness: awake, alert , and oriented  Airway & Oxygen Therapy: Patient Spontanous Breathing  Post-op Assessment: Report given to RN and Post -op Vital signs reviewed and stable  Post vital signs: Reviewed and stable  Last Vitals: See PACU flow sheet for all wnl v/s andr temp. Vitals Value Taken Time  BP    Temp    Pulse 62 11/11/23 1230  Resp 14 11/11/23 1230  SpO2 100 % 11/11/23 1230  Vitals shown include unfiled device data.  Last Pain:  Vitals:   11/11/23 1057  TempSrc: Temporal  PainSc: 10-Worst pain ever         Complications: No notable events documented.

## 2023-11-12 ENCOUNTER — Encounter: Payer: Self-pay | Admitting: Ophthalmology

## 2023-11-15 NOTE — Progress Notes (Unsigned)
 PROVIDER NOTE: Interpretation of information contained herein should be left to medically-trained personnel. Specific patient instructions are provided elsewhere under "Patient Instructions" section of medical record. This document was created in part using AI and STT-dictation technology, any transcriptional errors that may result from this process are unintentional.  Patient: Ellen Drake Ellen Drake  Service: E/M   PCP: Dale Defiance, MD  DOB: [birth year]  DOS: 11/16/2023  Provider: Oswaldo Done, MD  MRN: [id number]  Delivery: Face-to-face  Specialty: Interventional Pain Management  Type: Established Patient  Setting: Ambulatory outpatient facility  Specialty designation: 09  Referring Prov.: Dale Kirkland, MD  Location: Outpatient office facility       HPI  Ellen Drake, a 78 y.o. year old female, is here today because of her Chronic pain syndrome [G89.4]. Ms. Rogalski primary complain today is No chief complaint on file.  Pertinent problems: Ms. 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 Pain Assessment: Severity of   is reported as a  /10. Location:    / . Onset:  . Quality:  . Timing:  . Modifying factor(s):  Marland Kitchen Vitals:  vitals were not taken for this visit.  BMI: Estimated body mass index is 24.79 kg/m as calculated from the following:   Height as of 11/11/23: 5\' 5"  (1.651 m).   Weight as of 11/11/23: 149 lb (67.6 kg). Last encounter: 07/20/2023. Last procedure: Visit date not found.  Reason for encounter: medication management. ***  Discussed  the use of AI scribe software for clinical note transcription with the patient, who gave verbal consent to proceed.  History of Present Illness         RTCB: 05/21/2024   Pharmacotherapy Assessment  Analgesic: Tramadol 50 mg 1 tab PO BID (100 mg/Drake of tramadol) (10 MME) MME/Drake: 10 mg/Drake   Monitoring: [place] PMP: PDMP reviewed during this encounter.       Pharmacotherapy: No side-effects or adverse reactions reported. Compliance: No problems identified. Effectiveness: Clinically acceptable.  No notes on file  No results found for: "CBDTHCR" No results found for: "D8THCCBX" No results found for: "D9THCCBX"  UDS:  Summary  Date Value Ref Range Status  05/27/2023 Note  Final    Comment:    ==================================================================== Tox[employer] Select 13 (MW) ==================================================================== Test                             Result       Flag       Units  Drug Present and Declared for Prescription Verification   Tramadol                       >3185        EXPECTED   ng/mg creat   O-Desmethyltramadol            >3185        EXPECTED   ng/mg creat   N-Desmethyltramadol            468          EXPECTED   ng/mg creat  Source of tramadol is a prescription medication. O-desmethyltramadol    and N-desmethyltramadol are expected metabolites of tramadol.  ==================================================================== Test                      Result    Flag   Units      Ref Range   Creatinine              157              mg/dL      >=45 ==================================================================== Declared Medications:  The flagging and interpretation on this report are based on the  following declared medications.  Unexpected results may arise from  inaccuracies in the declared medications.   **Note: The testing scope of this panel includes these medications:   Tramadol (Ultram)   **Note: The testing scope  of this panel does not include the  following reported medications:   Amlodipine (Norvasc)  Aspirin  Carvedilol (Coreg)  Hydrochlorothiazide  Latanoprost (Xalatan)  Magnesium (Mag-Ox)  Potassium (Klor-Con)  Rosuvastatin (Crestor)  Sulfasalazine (Azulfidine)  Timolol (Timoptic)  Triamcinolone (Kenalog) ==================================================================== For clinical consultation, please call [phone]. ====================================================================       ROS  Constitutional: Denies any fever or chills Gastrointestinal: No reported hemesis, hematochezia, vomiting, or acute GI distress Musculoskeletal: Denies any acute onset joint swelling, redness, loss of ROM, or weakness Neurological: No reported episodes of acute onset apraxia, aphasia, dysarthria, agnosia, amnesia, paralysis, loss of coordination, or loss of consciousness  Medication Review  Aspirin, Vitamin D (Ergocalciferol), albuterol, amLODipine, carvedilol, fluticasone, folic acid, hydrochlorothiazide, latanoprost, magnesium oxide, potassium chloride, rosuvastatin, sulfaSALAzine, timolol, traMADol, and triamcinolone cream  History Review  Allergy: Ellen Drake is allergic to mercaptopurine, lyrica [pregabalin], and dilaudid [hydromorphone hcl]. Drug: Ellen Drake  reports no history of drug use. Alcohol:  reports current alcohol use of about 1.0 standard drink of alcohol per week. Tobacco:  reports that she quit smoking about 14 years ago. Her smoking use included cigarettes. She has never used smokeless tobacco. Social: Ellen Drake  reports that she quit smoking about 14 years ago. Her smoking use included cigarettes. She has never used smokeless tobacco. She reports current alcohol use of about 1.0 standard drink of alcohol per week. She reports that she does not use drugs. Medical:  has a past medical history of Anemia, Colon polyp (2009), Crohn's disease (HCC), Diverticulosis,  Fibrocystic breast disease, Grade I diastolic dysfunction, Herniated nucleus pulposus of lumbosacral region, History of chicken pox, Hypercholesterolemia, Hyperlipidemia, Hypertension, Mild aortic stenosis by prior echocardiogram, Mild mitral regurgitation by prior echocardiogram, Osteoarthritis, Pacemaker, Pulmonary nodule seen on imaging study (2004), and Second degree AV block, Mobitz type II. Surgical: Ms. Kotowski  has a past surgical history that includes Back surgery (1992); anterior cervical discectomy and fusion (1998); Knee surgery (Right, 1/16); Colonoscopy (2009); Colon surgery (1998); Posterior laminectomy / decompression lumbar spine (1999); Rotator cuff repair (Left, 2007); Colonoscopy w/ biopsies (11/30/2012); Colectomy (2003); Replacement total knee; Knee arthroscopy w/ synovectomy (Right, 03/10/2014); Colonoscopy w/ biopsies (04/15/2016); Cataract extraction w/PHACO (Right, 02/09/2019); Breast biopsy (Left); PACEMAKER IMPLANT (N/A, 03/19/2023); and Cataract extraction w/PHACO (Left, 11/11/2023). Family: family history includes CVA in her mother; Hypertension in her mother and sister.  Laboratory Chemistry Profile   Renal Lab Results  Component Value Date   BUN 16 10/15/2023   CREATININE 0.86 10/15/2023   GFR 64.94 10/15/2023   GFRNONAA 58 (L) 03/20/2023    Hepatic Lab Results  Component Value Date   AST 20  10/15/2023   ALT 9 10/15/2023   ALBUMIN 4.2 10/15/2023   ALKPHOS 57 10/15/2023    Electrolytes Lab Results  Component Value Date   NA 141 10/15/2023   K 4.0 10/15/2023   CL 106 10/15/2023   CALCIUM 9.8 10/15/2023   MG 2.0 10/15/2023    Bone Lab Results  Component Value Date   VD25OH 40.55 10/15/2023    Inflammation (CRP: Acute Phase) (ESR: Chronic Phase) Lab Results  Component Value Date   CRP 1.7 10/15/2023   ESRSEDRATE 27 10/15/2023         Note: Above Lab results reviewed.  Recent Imaging Review  DG Chest 2 View CLINICAL DATA:  Cough, RSV.  EXAM: CHEST  - 2 VIEW  COMPARISON:  Radiograph 08/31/2023  FINDINGS: Left-sided pacemaker with unchanged leads.The cardiomediastinal contours are normal. The lungs are clear. Pulmonary vasculature is normal. No consolidation, pleural effusion, or pneumothorax. Again seen scoliotic curvature of the spine. No acute osseous abnormalities are seen.  IMPRESSION: No focal airspace disease or evidence of pneumonia.  Electronically Signed   By: Narda Rutherford M.D.   On: 09/22/2023 15:57 Note: Reviewed        Physical Exam  General appearance: Well nourished, well developed, and well hydrated. In no apparent acute distress Mental status: Alert, oriented x 3 (person, place, & time)       Respiratory: No evidence of acute respiratory distress Eyes: PERLA Vitals: LMP 08/13/1995  BMI: Estimated body mass index is 24.79 kg/m as calculated from the following:   Height as of 11/11/23: 5\' 5"  (1.651 m).   Weight as of 11/11/23: 149 lb (67.6 kg). Ideal: Ideal body weight: 57 kg (125 lb 10.6 oz) Adjusted ideal body weight: 61.2 kg (135 lb)  Assessment   Diagnosis Status  1. Chronic pain syndrome   2. Chronic knee pain (1ry area of Pain) (Right)   3. 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 Chronic knee pain s/p TKR (Right)   5. Pharmacologic therapy   6. Chronic use of opiate for therapeutic purpose   7. Encounter for medication management   8. Encounter for chronic pain management    Controlled Controlled Controlled   Updated Problems: Problem  Shoulder Pain, Bilateral   Evaluated 01/26/23 - left shoulder pain - Dr Martha Clan - steroid injection.  F/u 08/2023 - left shoulder pain - s/p injection.   Saw [facility] 12/23/21 - s/p injection.  F/u [facility] 05/22/23 - s/p injection  Saw [facility] 12/23/21 - s/p injection.  Evaluated 01/26/23 - left shoulder pain - Dr Martha Clan - steroid injection.  F/u 08/2023 - left shoulder pain - s/p injection.    Plan of Care  Problem-specific:   Assessment and Plan           Ms. Joeline Drake Petitfrere has a current medication list which includes the following long-term medication(s): albuterol, amlodipine, carvedilol, fluticasone, hydrochlorothiazide, potassium chloride, rosuvastatin, sulfasalazine, and tramadol.  Pharmacotherapy (Medications Ordered): No orders of the defined types were placed in this encounter.  Orders:  No orders of the defined types were placed in this encounter.  Follow-up plan:   No follow-ups on file.     Interventional Therapies  Risk Factors  Considerations  Medical Comorbidities:  Allergy: Lyrica, Dilaudid  Note: Does not want SCS  Pacemaker in situ  Crohn's  HTN  Bradycardia  2nd Degree AV Block, Mobitz type II  Vit D & B12 Deficiency    Planned  Pending:  Therapeutic right genicular nerve RFA #2 (07/20/2023-denied by insurance company despite 90% improvement with first radiofrequency)   Under consideration:   Therapeutic right genicular nerve RFA #2  Diagnostic right Lumbar spinal cord stimulator trial (Does not want.)   Completed:   Diagnostic right genicular NB x1 (01/17/2021) (100/100/0/0)  Therapeutic right genicular nerve RFA x1 (03/07/2021) (100/100/100/90)    Therapeutic  Palliative (PRN) options:   Palliative right genicular nerve RFA    Completed by other providers:   None reported   Pharmacotherapy  Nonopioids transferred 05/08/2021: Gabapentin     Recent Visits No visits were found meeting these conditions. Showing recent visits within past 90 days and meeting all other requirements Future Appointments Date Type Provider Dept  11/16/23 Appointment Delano Metz, MD [facility]  Showing future appointments within next 90 days and meeting all other requirements  I discussed the assessment and treatment plan with the patient. The patient was provided an opportunity to ask questions and all were answered. The patient agreed with the plan and  demonstrated an understanding of the instructions.  Patient advised to call back or seek an in-person evaluation if the symptoms or condition worsens.  Duration of encounter: *** minutes.  Total time on encounter, as per AMA guidelines included both the face-to-face and non-face-to-face time personally spent by the physician and/or other qualified health care professional(s) on the Drake of the encounter (includes time in activities that require the physician or other qualified health care professional and does not include time in activities normally performed by clinical staff). Physician's time may include the following activities when performed: Preparing to see the patient (e.g., pre-charting review of records, searching for previously ordered imaging, lab work, and nerve conduction tests) Review of prior analgesic pharmacotherapies. Reviewing PMP Interpreting ordered tests (e.g., lab work, imaging, nerve conduction tests) Performing post-procedure evaluations, including interpretation of diagnostic procedures Obtaining and/or reviewing separately obtained history Performing a medically appropriate examination and/or evaluation Counseling and educating the patient/family/caregiver Ordering medications, tests, or procedures Referring and communicating with other health care professionals (when not separately reported) Documenting clinical information in the electronic or other health record Independently interpreting results (not separately reported) and communicating results to the patient/ family/caregiver Care coordination (not separately reported)  Note by: Oswaldo Done, MD (TTS and AI technology used. I apologize for any typographical errors that were not detected and corrected.) Date: 11/16/2023; Time: 1:12 PM

## 2023-11-15 NOTE — Patient Instructions (Signed)
 ______________________________________________________________________    New Medication Management Provider - Randalyn Rhea, NP  Purpose: To inform patients of the addition of a new member to our group, Randalyn Rhea, NP.  Applies to: All patients receiving prescriptions from our practice (written or electronic).  Announcement: We are happy to announce the addition of Randalyn Rhea, NP to or practice ([facility]).  She will take up a support role assisting our interventional pain specialists in the management of our patients.  She will be primarily assigned to the medication management portion of our practice.  Physician assignment: Patient will continue to be assigned to their current Pain Specialist Physician however, Ms. Allena Katz, NP will take over the Medication Management visits along with the responsibilities associated with such visits.  Medication Management: Any questions or inquiries associated with the day-to-day management of your pain medications, refills, or anything else associated with those prescriptions should be directed to Ms. Randalyn Rhea, NP.  Interventional Therapies: All issues associated with these therapies will continue to be managed by your Primary Pain Specialist.   Requesting appointments: When requesting that appointment, please make sure to specify whether the appointment is for Medication Management (to be scheduled with Ms. Allena Katz, NP) or if an evaluation is required/desired with your Primary Pain Specialist.  (Last updated: 11/10/2023) ______________________________________________________________________      ______________________________________________________________________    Opioid Pain Medication Update  To: All patients taking opioid pain medications. (I.e.: hydrocodone, hydromorphone, oxycodone, oxymorphone, morphine, codeine, methadone, tapentadol, tramadol, buprenorphine, fentanyl, etc.)  Re:  Updated review of side effects and adverse reactions of opioid analgesics, as well as new information about long term effects of this class of medications.  Direct risks of long-term opioid therapy are not limited to opioid addiction and overdose. Potential medical risks include serious fractures, breathing problems during sleep, hyperalgesia, immunosuppression, chronic constipation, bowel obstruction, myocardial infarction, and tooth decay secondary to xerostomia.  Unpredictable adverse effects that can occur even if you take your medication correctly: Cognitive impairment, respiratory depression, and death. Most people think that if they take their medication "correctly", and "as instructed", that they will be safe. Nothing could be farther from the truth. In reality, a significant amount of recorded deaths associated with the use of opioids has occurred in individuals that had taken the medication for a long time, and were taking their medication correctly. The following are examples of how this can happen: Patient taking his/her medication for a long time, as instructed, without any side effects, is given a certain antibiotic or another unrelated medication, which in turn triggers a "Drug-to-drug interaction" leading to disorientation, cognitive impairment, impaired reflexes, respiratory depression or an untoward event leading to serious bodily harm or injury, including death.  Patient taking his/her medication for a long time, as instructed, without any side effects, develops an acute impairment of liver and/or kidney function. This will lead to a rapid inability of the body to breakdown and eliminate their pain medication, which will result in effects similar to an "overdose", but with the same medicine and dose that they had always taken. This again may lead to disorientation, cognitive impairment, impaired reflexes, respiratory depression or an untoward event leading to serious bodily harm or injury,  including death.  A similar problem will occur with patients as they grow older and their liver and kidney function begins to decrease as part of the aging process.  Background information: Historically, the original case for using long-term opioid therapy to treat chronic noncancer pain was  based on safety assumptions that subsequent experience has called into question. In 1996, the American Pain Society and the American Academy of Pain Medicine issued a consensus statement supporting long-term opioid therapy. This statement acknowledged the dangers of opioid prescribing but concluded that the risk for addiction was low; respiratory depression induced by opioids was short-lived, occurred mainly in opioid-naive patients, and was antagonized by pain; tolerance was not a common problem; and efforts to control diversion should not constrain opioid prescribing. This has now proven to be wrong. Experience regarding the risks for opioid addiction, misuse, and overdose in community practice has failed to support these assumptions.  According to the Centers for Disease Control and Prevention, fatal overdoses involving opioid analgesics have increased sharply over the past decade. Currently, more than 96,700 people die from drug overdoses every year. Opioids are a factor in 7 out of every 10 overdose deaths. Deaths from drug overdose have surpassed motor vehicle accidents as the leading cause of death for individuals between the ages of 58 and 48.  Clinical data suggest that neuroendocrine dysfunction may be very common in both men and women, potentially causing hypogonadism, erectile dysfunction, infertility, decreased libido, osteoporosis, and depression. Recent studies linked higher opioid dose to increased opioid-related mortality. Controlled observational studies reported that long-term opioid therapy may be associated with increased risk for cardiovascular events. Subsequent meta-analysis concluded that the  safety of long-term opioid therapy in elderly patients has not been proven.   Side Effects and adverse reactions: Common side effects: Drowsiness (sedation). Dizziness. Nausea and vomiting. Constipation. Physical dependence -- Dependence often manifests with withdrawal symptoms when opioids are discontinued or decreased. Tolerance -- As you take repeated doses of opioids, you require increased medication to experience the same effect of pain relief. Respiratory depression -- This can occur in healthy people, especially with higher doses. However, people with COPD, asthma or other lung conditions may be even more susceptible to fatal respiratory impairment.  Uncommon side effects: An increased sensitivity to feeling pain and extreme response to pain (hyperalgesia). Chronic use of opioids can lead to this. Delayed gastric emptying (the process by which the contents of your stomach are moved into your small intestine). Muscle rigidity. Immune system and hormonal dysfunction. Quick, involuntary muscle jerks (myoclonus). Arrhythmia. Itchy skin (pruritus). Dry mouth (xerostomia).  Long-term side effects: Chronic constipation. Sleep-disordered breathing (SDB). Increased risk of bone fractures. Hypothalamic-pituitary-adrenal dysregulation. Increased risk of overdose.  RISKS: Respiratory depression and death: Opioids increase the risk of respiratory depression and death.  Drug-to-drug interactions: Opioids are relatively contraindicated in combination with benzodiazepines, sleep inducers, and other central nervous system depressants. Other classes of medications (i.e.: certain antibiotics and even over-the-counter medications) may also trigger or induce respiratory depression in some patients.  Medical conditions: Patients with pre-existing respiratory problems are at higher risk of respiratory failure and/or depression when in combination with opioid analgesics. Opioids are relatively  contraindicated in some medical conditions such as central sleep apnea.   Fractures and Falls:  Opioids increase the risk and incidence of falls. This is of particular importance in elderly patients.  Endocrine System:  Long-term administration is associated with endocrine abnormalities (endocrinopathies). (Also known as Opioid-induced Endocrinopathy) Influences on both the hypothalamic-pituitary-adrenal axis?and the hypothalamic-pituitary-gonadal axis have been demonstrated with consequent hypogonadism and adrenal insufficiency in both sexes. Hypogonadism and decreased levels of dehydroepiandrosterone sulfate have been reported in men and women. Endocrine effects include: Amenorrhoea in women (abnormal absence of menstruation) Reduced libido in both sexes Decreased sexual function Erectile dysfunction  in men Hypogonadisms (decreased testicular function with shrinkage of testicles) Infertility Depression and fatigue Loss of muscle mass Anxiety Depression Immune suppression Hyperalgesia Weight gain Anemia Osteoporosis Patients (particularly women of childbearing age) should avoid opioids. There is insufficient evidence to recommend routine monitoring of asymptomatic patients taking opioids in the long-term for hormonal deficiencies.  Immune System: Human studies have demonstrated that opioids have an immunomodulating effect. These effects are mediated via opioid receptors both on immune effector cells and in the central nervous system. Opioids have been demonstrated to have adverse effects on antimicrobial response and anti-tumour surveillance. Buprenorphine has been demonstrated to have no impact on immune function.  Opioid Induced Hyperalgesia: Human studies have demonstrated that prolonged use of opioids can lead to a state of abnormal pain sensitivity, sometimes called opioid induced hyperalgesia (OIH). Opioid induced hyperalgesia is not usually seen in the absence of tolerance  to opioid analgesia. Clinically, hyperalgesia may be diagnosed if the patient on long-term opioid therapy presents with increased pain. This might be qualitatively and anatomically distinct from pain related to disease progression or to breakthrough pain resulting from development of opioid tolerance. Pain associated with hyperalgesia tends to be more diffuse than the pre-existing pain and less defined in quality. Management of opioid induced hyperalgesia requires opioid dose reduction.  Cancer: Chronic opioid therapy has been associated with an increased risk of cancer among noncancer patients with chronic pain. This association was more evident in chronic strong opioid users. Chronic opioid consumption causes significant pathological changes in the small intestine and colon. Epidemiological studies have found that there is a link between opium dependence and initiation of gastrointestinal cancers. Cancer is the second leading cause of death after cardiovascular disease. Chronic use of opioids can cause multiple conditions such as GERD, immunosuppression and renal damage as well as carcinogenic effects, which are associated with the incidence of cancers.   Mortality: Long-term opioid use has been associated with increased mortality among patients with chronic non-cancer pain (CNCP).  Prescription of long-acting opioids for chronic noncancer pain was associated with a significantly increased risk of all-cause mortality, including deaths from causes other than overdose.  Reference: Von Korff M, Kolodny A, Deyo RA, Chou R. Long-term opioid therapy reconsidered. Ann Intern Med. 2011 Sep 6;155(5):325-8. doi: 10.7326/[id number]. PMID: 98119147; PMCID: [id number]. Randon Goldsmith, Hayward RA, Dunn KM, [place] KP. Risk of adverse events in patients prescribed long-term opioids: A cohort study in the [place] Clinical Practice Research Datalink. Eur J Pain. 2019 May;23(5):908-922.  doi: 10.1002/ejp.1357. Epub 2019 Jan 31. PMID: 65784696. Colameco S, Coren JS, Ciervo CA. Continuous opioid treatment for chronic noncancer pain: a time for moderation in prescribing. Postgrad Med. 2009 Jul;121(4):61-6. doi: 10.3810/pgm.2009.07.2032. PMID: 29528413. William Hamburger RN, Birchwood SD, Blazina I, Cristopher Peru, Bougatsos C, Deyo RA. The effectiveness and risks of long-term opioid therapy for chronic pain: a systematic review for a [employer] of [employer] Pathways to [employer]. Ann Intern Med. 2015 Feb 17;162(4):276-86. doi: 10.7326/M14-2559. PMID: 24401027. Caryl Bis [facility], Makuc DM. NCHS Data Brief No. 22. [place]: Centers for Disease Control and Prevention; 2009. Sep, Increase in Fatal Poisonings Involving Opioid Analgesics in the [place], 1999-2006. Song IA, Choi HR, Oh TK. Long-term opioid use and mortality in patients with chronic non-cancer pain: Ten-year follow-up study in [place] from 2010 through 2019. EClinicalMedicine. 2022 Jul 18;51:101558. doi: 10.1016/j.eclinm.2022.253664. PMID: 40347425; PMCID: [id number]. Huser, WChristean Leaf, T., Vogelmann, T. et al. All-cause mortality in patients with long-term opioid  therapy compared with non-opioid analgesics for chronic non-cancer pain: a database study. BMC Med 18, 162 (2020). http://lester.info/ Rashidian H, Karie Kirks, Malekzadeh R, Haghdoost AA. An Ecological Study of the Association between Opiate Use and Incidence of Cancers. Addict Health. 2016 Fall;8(4):252-260. PMID: 40981191; PMCID: [id number].  Our Goal: Our goal is to control your pain with means other than the use of opioid pain medications.  Our Recommendation: Talk to your physician about coming off of these medications. We can assist you with the tapering down and stopping these medicines. Based on the new information, even if you cannot completely stop the medication, a decrease in the dose  may be associated with a lesser risk. Ask for other means of controlling the pain. Decrease or eliminate those factors that significantly contribute to your pain such as smoking, obesity, and a diet heavily tilted towards "inflammatory" nutrients.  Last Updated: 02/16/2023   ______________________________________________________________________       ______________________________________________________________________    National Pain Medication Shortage  The [place] is experiencing worsening drug shortages. These have had a negative widespread effect on patient care and treatment. Not expected to improve any time soon. Predicted to last past 2029.   Drug shortage list (generic names) Oxycodone IR Oxycodone/APAP Oxymorphone IR Hydromorphone Hydrocodone/APAP Morphine  Where is the problem?  Manufacturing and supply level.  Will this shortage affect you?  Only if you take any of the above pain medications.  How? You may be unable to fill your prescription.  Your pharmacist may offer a "partial fill" of your prescription. (Warning: Do not accept partial fills.) Prescriptions partially filled cannot be transferred to another pharmacy. Read our Medication Rules and Regulation. Depending on how much medicine you are dependent on, you may experience withdrawals when unable to get the medication.  Recommendations: Consider ending your dependence on opioid pain medications. Ask your pain specialist to assist you with the process. Consider switching to a medication currently not in shortage, such as Buprenorphine. Talk to your pain specialist about this option. Consider decreasing your pain medication requirements by managing tolerance thru "Drug Holidays". This may help minimize withdrawals, should you run out of medicine. Control your pain thru the use of non-pharmacological interventional therapies.   Your prescriber: Prescribers cannot be blamed for shortages. Medication  manufacturing and supply issues cannot be fixed by the prescriber.   NOTE: The prescriber is not responsible for supplying the medication, or solving supply issues. Work with your pharmacist to solve it. The patient is responsible for the decision to take or continue taking the medication and for identifying and securing a legal supply source. By law, supplying the medication is the job and responsibility of the pharmacy. The prescriber is responsible for the evaluation, monitoring, and prescribing of these medications.   Prescribers will NOT: Re-issue prescriptions that have been partially filled. Re-issue prescriptions already sent to a pharmacy.  Re-send prescriptions to a different pharmacy because yours did not have your medication. Ask pharmacist to order more medicine or transfer the prescription to another pharmacy. (Read below.)  New 2023 regulation: "April 11, 2022 Revised Regulation Allows DEA-Registered Pharmacies to Transfer Electronic Prescriptions at a Patient's Request DEA Headquarters Division - Public Information Office Patients now have the ability to request their electronic prescription be transferred to another pharmacy without having to go back to their practitioner to initiate the request. This revised regulation went into effect on Monday, April 07, 2022.     At a patient's request, a DEA-registered retail pharmacy can  now transfer an electronic prescription for a controlled substance (schedules II-V) to another DEA-registered retail pharmacy. Prior to this change, patients would have to go through their practitioner to cancel their prescription and have it re-issued to a different pharmacy. The process was taxing and time consuming for both patients and practitioners.    The Drug Enforcement Administration [facility]) published its intent to revise the process for transferring electronic prescriptions on June 29, 2020.  The final rule was published in the federal  register on March 06, 2022 and went into effect 30 days later.  Under the final rule, a prescription can only be transferred once between pharmacies, and only if allowed under existing state or other applicable law. The prescription must remain in its electronic form; may not be altered in any way; and the transfer must be communicated directly between two licensed pharmacists. It's important to note, any authorized refills transfer with the original prescription, which means the entire prescription will be filled at the same pharmacy".  Reference: HugeHand.is [facility] website announcement)  [email or web address].pdf Financial planner of Justice)   [employer] / Vol. 88, No. 143 / Thursday, March 06, 2022 / Rules and Regulations DEPARTMENT OF JUSTICE  Drug Enforcement Administration  21 CFR Part 1306  [Docket No. [id number]]  RIN [id number] Transfer of Electronic Prescriptions for Schedules II-V Controlled Substances Between Pharmacies for Initial Filling  ______________________________________________________________________       ______________________________________________________________________    Transfer of Pain Medication between Pharmacies  Re: 2023 DEA Clarification on existing regulation  Published on DEA Website: April 11, 2022  Title: Revised Regulation Allows DEA-Registered Pharmacies to Electrical engineer Prescriptions at a Patient's Request DEA Headquarters Division - [employer]  "Patients now have the ability to request their electronic prescription be transferred to another pharmacy without having to go back to their practitioner to initiate the request. This revised regulation went into effect on Monday, April 07, 2022.     At a patient's request, a DEA-registered retail pharmacy can now  transfer an electronic prescription for a controlled substance (schedules II-V) to another DEA-registered retail pharmacy. Prior to this change, patients would have to go through their practitioner to cancel their prescription and have it re-issued to a different pharmacy. The process was taxing and time consuming for both patients and practitioners.    The Drug Enforcement Administration [facility]) published its intent to revise the process for transferring electronic prescriptions on June 29, 2020.  The final rule was published in the federal register on March 06, 2022 and went into effect 30 days later.  Under the final rule, a prescription can only be transferred once between pharmacies, and only if allowed under existing state or other applicable law. The prescription must remain in its electronic form; may not be altered in any way; and the transfer must be communicated directly between two licensed pharmacists. It's important to note, any authorized refills transfer with the original prescription, which means the entire prescription will be filled at the same pharmacy."    REFERENCES: 1. DEA website announcement [email or web address]  2. Department of Justice website  [email or web address].pdf  3. DEPARTMENT OF JUSTICE Drug Enforcement Administration 21 CFR Part 1306 [Docket No. [id number]] RIN [id number]-[id number] "Transfer of Electronic Prescriptions for Schedules II-V Controlled Substances Between Pharmacies for Initial Filling"  ______________________________________________________________________       ______________________________________________________________________    Medication Rules  Purpose: To inform patients, and their family members, of our medication rules and regulations.  Applies to: All patients receiving  prescriptions from our practice  (written or electronic).  Pharmacy of record: This is the pharmacy where your electronic prescriptions will be sent. Make sure we have the correct one.  Electronic prescriptions: In compliance with the [facility] Strengthen Opioid Misuse Prevention (STOP) Act of 2017 (Session Conni Elliot [phone]), effective August 11, 2018, all controlled substances must be electronically prescribed. Written prescriptions, faxing, or calling prescriptions to a pharmacy will no longer be done.  Prescription refills: These will be provided only during in-person appointments. No medications will be renewed without a "face-to-face" evaluation with your provider. Applies to all prescriptions.  NOTE: The following applies primarily to controlled substances (Opioid* Pain Medications).   Type of encounter (visit): For patients receiving controlled substances, face-to-face visits are required. (Not an option and not up to the patient.)  Patient's Responsibilities: Pain Pills: Bring all pain pills to every appointment (except for procedure appointments). Pill counts are required.  Pill Bottles: Bring pills in original pharmacy bottle. Bring bottle, even if empty. Always bring the bottle of the most recent fill.  Medication refills: You are responsible for knowing and keeping track of what medications you are taking and when is it that you will need a refill. The day before your appointment: write a list of all prescriptions that need to be refilled. The day of the appointment: give the list to the admitting nurse. Prescriptions will be written only during appointments. No prescriptions will be written on procedure days. If you forget a medication: it will not be "Called in", "Faxed", or "electronically sent". You will need to get another appointment to get these prescribed. No early refills. Do not call asking to have your prescription filled early. Partial  or short prescriptions: Occasionally your pharmacy may not have  enough pills to fill your prescription.  NEVER ACCEPT a partial fill or a prescription that is short of the total amount of pills that you were prescribed.  With controlled substances the law allows 72 hours for the pharmacy to complete the prescription.  If the prescription is not completed within 72 hours, the pharmacist will require a new prescription to be written. This means that you will be short on your medicine and we WILL NOT send another prescription to complete your original prescription.  Instead, request the pharmacy to send a carrier to a nearby branch to get enough medication to provide you with your full prescription. Prescription Accuracy: You are responsible for carefully inspecting your prescriptions before leaving our office. Have the discharge nurse carefully go over each prescription with you, before taking them home. Make sure that your name is accurately spelled, that your address is correct. Check the name and dose of your medication to make sure it is accurate. Check the number of pills, and the written instructions to make sure they are clear and accurate. Make sure that you are given enough medication to last until your next medication refill appointment. Taking Medication: Take medication as prescribed. When it comes to controlled substances, taking less pills or less frequently than prescribed is permitted and encouraged. Never take more pills than instructed. Never take the medication more frequently than prescribed.  Inform other Doctors: Always inform, all of your healthcare providers, of all the medications you take. Pain Medication from other Providers: You are not allowed to accept any additional pain medication from any other Doctor or Healthcare provider. There are two exceptions to this rule. (see below) In the event that you require additional pain medication, you are responsible for  notifying us, as stated below. Cough Medicine: Often these contain an opioid, such as  codeine or hydrocodone. Never accept or take cough medicine containing these opioids if you are already taking an opioid* medication. The combination may cause respiratory failure and death. Medication Agreement: You are responsible for carefully reading and following our Medication Agreement. This must be signed before receiving any prescriptions from our practice. Safely store a copy of your signed Agreement. Violations to the Agreement will result in no further prescriptions. (Additional copies of our Medication Agreement are available upon request.) Laws, Rules, & Regulations: All patients are expected to follow all [address] and [employer], Rules, [employer]. Ignorance of the Laws does not constitute a valid excuse.  Illegal drugs and Controlled Substances: The use of illegal substances (including, but not limited to marijuana and its derivatives) and/or the illegal use of any controlled substances is strictly prohibited. Violation of this rule may result in the immediate and permanent discontinuation of any and all prescriptions being written by our practice. The use of any illegal substances is prohibited. Adopted CDC guidelines & recommendations: Target dosing levels will be at or below 60 MME/day. Use of benzodiazepines** is not recommended. Urine Drug testing: Patients taking controlled substances will be required to provide a urine sample upon request. Do not void before coming to your medication management appointments. Hold emptying your bladder until you are admitted. The admitting nurse will inform you if a sample is required. Our practice reserves the right to call you at any time to provide a sample. Once receiving the call, you have 24 hours to comply with request. Not providing a sample upon request may result in termination of medication therapy.  Exceptions: There are only two exceptions to the rule of not receiving pain medications from other Healthcare Providers. Exception  #1 (Emergencies): In the event of an emergency (i.e.: accident requiring emergency care), you are allowed to receive additional pain medication. However, you are responsible for: As soon as you are able, call our office [phone], at any time of the day or night, and leave a message stating your name, the date and nature of the emergency, and the name and dose of the medication prescribed. In the event that your call is answered by a member of our staff, make sure to document and save the date, time, and the name of the person that took your information.  Exception #2 (Planned Surgery): In the event that you are scheduled by another doctor or dentist to have any type of surgery or procedure, you are allowed (for a period no longer than 30 days), to receive additional pain medication, for the acute post-op pain. However, in this case, you are responsible for picking up a copy of our "Post-op Pain Management for Surgeons" handout, and giving it to your surgeon or dentist. This document is available at our office, and does not require an appointment to obtain it. Simply go to our office during business hours (Monday-Thursday from 8:00 AM to 4:00 PM) (Friday 8:00 AM to 12:00 Noon) or if you have a scheduled appointment with [place], prior to your surgery, and ask for it by name. In addition, you are responsible for: calling our office [phone], at any time of the day or night, and leaving a message stating your name, name of your surgeon, type of surgery, and date of procedure or surgery. Failure to comply with your responsibilities may result in termination of therapy involving the controlled substances.  Consequences:  Non-compliance with the above rules may result in permanent discontinuation of medication prescription therapy. All patients receiving any type of controlled substance is expected to comply with the above patient responsibilities. Not doing so may result in permanent discontinuation of  medication prescription therapy. Medication Agreement Violation. Following the above rules, including your responsibilities will help you in avoiding a Medication Agreement Violation ("Breaking your Pain Medication Contract").  *Opioid medications include: morphine, codeine, oxycodone, oxymorphone, hydrocodone, hydromorphone, meperidine, tramadol, tapentadol, buprenorphine, fentanyl, methadone. **Benzodiazepine medications include: diazepam (Valium), alprazolam (Xanax), clonazepam (Klonopine), lorazepam (Ativan), clorazepate (Tranxene), chlordiazepoxide (Librium), estazolam (Prosom), oxazepam (Serax), temazepam (Restoril), triazolam (Halcion) (Last updated: 06/03/2023) ______________________________________________________________________      ______________________________________________________________________    Medication Recommendations and Reminders  Applies to: All patients receiving prescriptions (written and/or electronic).  Medication Rules & Regulations: You are responsible for reading, knowing, and following our "Medication Rules" document. These exist for your safety and that of others. They are not flexible and neither are we. Dismissing or ignoring them is an act of "non-compliance" that may result in complete and irreversible termination of such medication therapy. For safety reasons, "non-compliance" will not be tolerated. As with the [place] fundamental legal principle of "ignorance of the law is no defense", we will accept no excuses for not having read and knowing the content of documents provided to you by our practice.  Pharmacy of record:  Definition: This is the pharmacy where your electronic prescriptions will be sent.  We do not endorse any particular pharmacy. It is up to you and your insurance to decide what pharmacy to use.  We do not restrict you in your choice of pharmacy. However, once we write for your prescriptions, we will NOT be re-sending more prescriptions to  fix restricted supply problems created by your pharmacy, or your insurance.  The pharmacy listed in the electronic medical record should be the one where you want electronic prescriptions to be sent. If you choose to change pharmacy, simply notify our nursing staff. Changes will be made only during your regular appointments and not over the phone.  Recommendations: Keep all of your pain medications in a safe place, under lock and key, even if you live alone. We will NOT replace lost, stolen, or damaged medication. We do not accept "Police Reports" as proof of medications having been stolen. After you fill your prescription, take 1 week's worth of pills and put them away in a safe place. You should keep a separate, properly labeled bottle for this purpose. The remainder should be kept in the original bottle. Use this as your primary supply, until it runs out. Once it's gone, then you know that you have 1 week's worth of medicine, and it is time to come in for a prescription refill. If you do this correctly, it is unlikely that you will ever run out of medicine. To make sure that the above recommendation works, it is very important that you make sure your medication refill appointments are scheduled at least 1 week before you run out of medicine. To do this in an effective manner, make sure that you do not leave the office without scheduling your next medication management appointment. Always ask the nursing staff to show you in your prescription , when your medication will be running out. Then arrange for the receptionist to get you a return appointment, at least 7 days before you run out of medicine. Do not wait until you have 1 or 2 pills left, to come in.  This is very poor planning and does not take into consideration that we may need to cancel appointments due to bad weather, sickness, or emergencies affecting our staff. DO NOT ACCEPT A "Partial Fill": If for any reason your pharmacy does not have enough  pills/tablets to completely fill or refill your prescription, do not allow for a "partial fill". The law allows the pharmacy to complete that prescription within 72 hours, without requiring a new prescription. If they do not fill the rest of your prescription within those 72 hours, you will need a separate prescription to fill the remaining amount, which we will NOT provide. If the reason for the partial fill is your insurance, you will need to talk to the pharmacist about payment alternatives for the remaining tablets, but again, DO NOT ACCEPT A PARTIAL FILL, unless you can trust your pharmacist to obtain the remainder of the pills within 72 hours.  Prescription refills and/or changes in medication(s):  Prescription refills, and/or changes in dose or medication, will be conducted only during scheduled medication management appointments. (Applies to both, written and electronic prescriptions.) No refills on procedure days. No medication will be changed or started on procedure days. No changes, adjustments, and/or refills will be conducted on a procedure day. Doing so will interfere with the diagnostic portion of the procedure. No phone refills. No medications will be "called into the pharmacy". No Fax refills. No weekend refills. No Holliday refills. No after hours refills.  Remember:  Business hours are:  Monday to Thursday 8:00 AM to 4:00 PM Provider's Schedule: Delano Metz, MD - Appointments are:  Medication management: Monday and Wednesday 8:00 AM to 4:00 PM Procedure day: Tuesday and Thursday 7:30 AM to 4:00 PM Edward Jolly, MD - Appointments are:  Medication management: Tuesday and Thursday 8:00 AM to 4:00 PM Procedure day: Monday and Wednesday 7:30 AM to 4:00 PM (Last update: 06/03/2022) ______________________________________________________________________      ______________________________________________________________________    Drug Holidays  What is a "Drug  Holiday"? Drug Holiday: is the name given to the process of slowly tapering down and temporarily stopping the pain medication for the purpose of decreasing or eliminating tolerance to the drug.  Benefits Improved effectiveness Decreased required effective dose Improved pain control End dependence on high dose therapy Decrease cost of therapy Uncovering "opioid-induced hyperalgesia". (OIH)  What is "opioid hyperalgesia"? It is a paradoxical increase in pain caused by exposure to opioids. Stopping the opioid pain medication, contrary to the expected, it actually decreases or completely eliminates the pain. Ref.: "A comprehensive review of opioid-induced hyperalgesia". Donney Rankins, et.al. Pain Physician. 2011 Mar-Apr;14(2):145-61.  What is tolerance? Tolerance: the progressive loss of effectiveness of a pain medicine due to repetitive use. A common problem of opioid pain medications.  How long should a "Drug Holiday" last? Effectiveness depends on the patient staying off all opioid pain medicines for a minimum of 14 consecutive days. (2 weeks) During this time the patient should not be taking any other opioid analgesic medication.  How about just taking less of the medicine? Does not work. Will not accomplish goal of eliminating the excess receptors.  How about switching to a different pain medicine? (AKA. "Opioid rotation") Does not work. Creates the illusion of effectiveness by taking advantage of inaccurate equivalent dose calculations between different opioids. -This "technique" was promoted by studies funded by [employer], such as [employer], creators of "OxyContin".  Can I stop the medicine "cold [place]"? We do not recommend it. You should always coordinate with  your prescribing physician to make the transition as smoothly as possible. Avoid stopping the medicine abruptly without consulting. We recommend a "slow taper".  What is a slow taper? Taper: refers to the  gradual decrease in dose.   How do I stop/taper the dose? Slowly. Decrease the daily amount of pills that you take by one (1) pill every seven (7) days. This is called a "slow downward taper". Example: if you normally take four (4) pills per day, drop it to three (3) pills per day for seven (7) days, then to two (2) pills per day for seven (7) days, then to one (1) per day for seven (7) days, and then stop the medicine. The 14 day "Drug Holiday" starts on the first day without medicine.   Will I experience withdrawals? Unlikely with a slow taper.  What triggers withdrawals? Withdrawals are triggered by the sudden/abrupt stop of high dose opioids. Withdrawals can be avoided by slowly decreasing the dose over a prolonged period of time.  What are withdrawals? Symptoms associated with sudden/abrupt reduction/stopping of high-dose, long-term use of pain medication. Withdrawal are seldom seen on low dose therapy, or patients rarely taking opioid medication.  Early Withdrawal Symptoms may include: Agitation Anxiety Muscle aches Increased tearing Insomnia Runny nose Sweating Yawning  Late symptoms may include: Abdominal cramping Diarrhea Dilated pupils Goose bumps Nausea Vomiting  When could I see withdrawals? Onset: 8-24 hours after last use for most opioids. 12-48 hours for long-acting opioids (i.e.: methadone)  How long could they last? Duration: 4-10 days for most opioids. 14-21 days for long-acting opioids (i.e.: methadone)  What will happen after I complete my "Drug Holiday"? The need and indications for the opioid analgesic will be reviewed before restarting the medication. Dose requirements will likely decrease and the dose will need to be adjusted accordingly.   (Last update: 06/03/2023) ______________________________________________________________________      ______________________________________________________________________     Naloxone Nasal Spray  Why am  I receiving this medication? [place] STOP ACT requires that all patients taking high dose opioids or at risk of opioids respiratory depression, be prescribed an opioid reversal agent, such as Naloxone (AKA: Narcan).  What is this medication? NALOXONE (nal OX one) treats opioid overdose, which causes slow or shallow breathing, severe drowsiness, or trouble staying awake. Call emergency services after using this medication. You may need additional treatment. Naloxone works by reversing the effects of opioids. It belongs to a group of medications called opioid blockers.  COMMON BRAND NAME(S): Kloxxado, Narcan  What should I tell my care team before I take this medication? They need to know if you have any of these conditions: Heart disease Substance use disorder An unusual or allergic reaction to naloxone, other medications, foods, dyes, or preservatives Pregnant or trying to get pregnant Breast-feeding  When to use this medication? This medication is to be used for the treatment of respiratory depression (less than 8 breaths per minute) secondary to opioid overdose.   How to use this medication? This medication is for use in the nose. Lay the person on their back. Support their neck with your hand and allow the head to tilt back before giving the medication. The nasal spray should be given into 1 nostril. After giving the medication, move the person onto their side. Do not remove or test the nasal spray until ready to use. Get emergency medical help right away after giving the first dose of this medication, even if the person wakes up. You should be familiar  with how to recognize the signs and symptoms of a narcotic overdose. If more doses are needed, give the additional dose in the other nostril. Talk to your care team about the use of this medication in children. While this medication may be prescribed for children as young as newborns for selected conditions, precautions do  apply.  Naloxone Overdosage: If you think you have taken too much of this medicine contact a poison control center or emergency room at once.  NOTE: This medicine is only for you. Do not share this medicine with others.  What if I miss a dose? This does not apply.  What may interact with this medication? This is only used during an emergency. No interactions are expected during emergency use. This list may not describe all possible interactions. Give your health care provider a list of all the medicines, herbs, non-prescription drugs, or dietary supplements you use. Also tell them if you smoke, drink alcohol, or use illegal drugs. Some items may interact with your medicine.  What should I watch for while using this medication? Keep this medication ready for use in the case of an opioid overdose. Make sure that you have the phone number of your care team and local hospital ready. You may need to have additional doses of this medication. Each nasal spray contains a single dose. Some emergencies may require additional doses. After use, bring the treated person to the nearest hospital or call 911. Make sure the treating care team knows that the person has received a dose of this medication. You will receive additional instructions on what to do during and after use of this medication before an emergency occurs.  What side effects may I notice from receiving this medication? Side effects that you should report to your care team as soon as possible: Allergic reactions--skin rash, itching, hives, swelling of the face, lips, tongue, or throat Side effects that usually do not require medical attention (report these to your care team if they continue or are bothersome): Constipation Dryness or irritation inside the nose Headache Increase in blood pressure Muscle spasms Stuffy nose Toothache This list may not describe all possible side effects. Call your doctor for medical advice about side effects.  You may report side effects to FDA at 1-800-FDA-1088.  Where should I keep my medication? Because this is an emergency medication, you should keep it with you at all times.  Keep out of the reach of children and pets. Store between 20 and 25 degrees C (68 and 77 degrees F). Do not freeze. Throw away any unused medication after the expiration date. Keep in original box until ready to use.  NOTE: This sheet is a summary. It may not cover all possible information. If you have questions about this medicine, talk to your doctor, pharmacist, or health care provider.   2023 Elsevier/Gold Standard (2021-04-05 00:00:00)  ______________________________________________________________________

## 2023-11-16 ENCOUNTER — Telehealth: Payer: Self-pay | Admitting: Cardiology

## 2023-11-16 ENCOUNTER — Ambulatory Visit (HOSPITAL_BASED_OUTPATIENT_CLINIC_OR_DEPARTMENT_OTHER): Payer: Medicare HMO | Admitting: Pain Medicine

## 2023-11-16 DIAGNOSIS — G894 Chronic pain syndrome: Secondary | ICD-10-CM

## 2023-11-16 DIAGNOSIS — Z91199 Patient's noncompliance with other medical treatment and regimen due to unspecified reason: Secondary | ICD-10-CM

## 2023-11-16 DIAGNOSIS — G8929 Other chronic pain: Secondary | ICD-10-CM

## 2023-11-16 DIAGNOSIS — Z79899 Other long term (current) drug therapy: Secondary | ICD-10-CM

## 2023-11-16 DIAGNOSIS — Z79891 Long term (current) use of opiate analgesic: Secondary | ICD-10-CM

## 2023-11-16 NOTE — Telephone Encounter (Signed)
 Pt c/o swelling/edema: STAT if pt has developed SOB within 24 hours  If swelling, where is the swelling located? Feet   How much weight have you gained and in what time span? No   Have you gained 2 pounds in a day or 5 pounds in a week? No   Do you have a log of your daily weights (if so, list)? Did not take   Are you currently taking a fluid pill? No   Are you currently SOB? No   Have you traveled recently in a car or plane for an extended period of time? 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 She states the pain is radiating down her wrist, please advise.
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
## 2023-11-16 NOTE — Telephone Encounter (Signed)
 Call transferred from call center.   Her feet & ankle are swollen. She states it just started Friday. Stayed Constant since Friday with no change. Hasn't missed any doses of medications. BP last night 149/70. Called for similar issue in February, was instructed to stop amlodipine due to swelling. Notes no change in swelling since stopping the amlodipine.   She also endorses left arm pain times one month, that goes down her arm. She states it is around her pacemaker insertion site. She states it is there constant. Denies any other symptoms.  Notes she did have eye surgery last Wednesday, she does not recall if she was given fluids during her procedure.   Pt is currently scheduled 6/23. As has not seen Gen cards in office, will route to last seen provider.

## 2023-11-17 NOTE — Telephone Encounter (Signed)
 Pt transmission was sent and would like a call back to go over it . Pt is still having shoulder pain

## 2023-11-17 NOTE — Telephone Encounter (Signed)
 Left detailed message on patients VM advising her remote transmission is normal and no alerts noted. Advised to follow up with PCP or ortho (whoever who currently manages). Advised to call back if she has further questions at [phone].

## 2023-11-20 ENCOUNTER — Other Ambulatory Visit: Payer: Self-pay

## 2023-11-20 DIAGNOSIS — M19012 Primary osteoarthritis, left shoulder: Secondary | ICD-10-CM | POA: Diagnosis not present

## 2023-11-23 ENCOUNTER — Other Ambulatory Visit (INDEPENDENT_AMBULATORY_CARE_PROVIDER_SITE_OTHER)

## 2023-11-23 LAB — MAGNESIUM: Magnesium: 1.9 mg/dL (ref 1.5–2.5)

## 2023-11-24 DIAGNOSIS — I10 Essential (primary) hypertension: Secondary | ICD-10-CM | POA: Diagnosis not present

## 2023-11-24 DIAGNOSIS — K5010 Crohn's disease of large intestine without complications: Secondary | ICD-10-CM | POA: Diagnosis not present

## 2023-11-24 DIAGNOSIS — H409 Unspecified glaucoma: Secondary | ICD-10-CM | POA: Diagnosis not present

## 2023-11-24 DIAGNOSIS — G8929 Other chronic pain: Secondary | ICD-10-CM | POA: Diagnosis not present

## 2023-11-24 DIAGNOSIS — L309 Dermatitis, unspecified: Secondary | ICD-10-CM | POA: Diagnosis not present

## 2023-11-24 DIAGNOSIS — E559 Vitamin D deficiency, unspecified: Secondary | ICD-10-CM | POA: Diagnosis not present

## 2023-11-24 DIAGNOSIS — E785 Hyperlipidemia, unspecified: Secondary | ICD-10-CM | POA: Diagnosis not present

## 2023-11-24 DIAGNOSIS — Z7982 Long term (current) use of aspirin: Secondary | ICD-10-CM | POA: Diagnosis not present

## 2023-11-24 DIAGNOSIS — E538 Deficiency of other specified B group vitamins: Secondary | ICD-10-CM | POA: Diagnosis not present

## 2023-11-24 DIAGNOSIS — E876 Hypokalemia: Secondary | ICD-10-CM | POA: Diagnosis not present

## 2023-11-24 DIAGNOSIS — Z950 Presence of cardiac pacemaker: Secondary | ICD-10-CM | POA: Diagnosis not present

## 2023-12-07 ENCOUNTER — Ambulatory Visit: Payer: Self-pay

## 2023-12-07 ENCOUNTER — Other Ambulatory Visit: Payer: Self-pay | Admitting: Pain Medicine

## 2023-12-07 DIAGNOSIS — G894 Chronic pain syndrome: Secondary | ICD-10-CM

## 2023-12-07 DIAGNOSIS — Z79891 Long term (current) use of opiate analgesic: Secondary | ICD-10-CM

## 2023-12-07 DIAGNOSIS — G8929 Other chronic pain: Secondary | ICD-10-CM

## 2023-12-07 DIAGNOSIS — Z79899 Other long term (current) drug therapy: Secondary | ICD-10-CM

## 2023-12-07 NOTE — Telephone Encounter (Signed)
 Moved appt up to this week to address ongoing leg cramps.

## 2023-12-07 NOTE — Telephone Encounter (Signed)
  Chief Complaint: leg cramping Symptoms: occasional swelling Frequency: ongoing  Pertinent Negatives: Patient denies calf pain, breathing difficulty, rash fever Disposition: [] ED /[] Urgent Care (no appt availability in office) / [x] Appointment(In office/virtual)/ []  [facility]/ [] Home Care/ [] Refused Recommended Disposition /[] [facility]/ []  Follow-up with PCP Additional Notes: Pt schedule for next opening which is end of May. Wait listed the appt.  Copied from CRM [id number]. Topic: Clinical - Medication Question >> Dec 07, 2023 10:16 AM Lorenz Romano B wrote: Reason for CRM:  patient is calling  because she said she was told to take an over the counter magnesium  oxide and it has giving her leg cramps and she wants to speak to someone to see what else she maybe able to take please call patient back                [phone] Reason for Disposition  [1] MODERATE pain (e.g., interferes with normal activities, limping) AND [2] present > 3 days  Answer Assessment - Initial Assessment Questions 1. ONSET: "When did the pain start?"      On and off and medicine is not helping 2. LOCATION: "Where is the pain located?"      Knees down both feet  3. PAIN: "How bad is the pain?"    (Scale 1-10; or mild, moderate, severe)   -  MILD (1-3): doesn't interfere with normal activities    -  MODERATE (4-7): interferes with normal activities (e.g., work or school) or awakens from sleep, limping    -  SEVERE (8-10): excruciating pain, unable to do any normal activities, unable to walk     moderate 5. CAUSE: "What do you think is causing the leg pain?"     The medication "making me worse" 6. OTHER SYMPTOMS: "Do you have any other symptoms?" (e.g., chest pain, back pain, breathing difficulty, swelling, rash, fever, numbness, weakness)     Swelling occasionally  Protocols used: Leg Pain-A-AH

## 2023-12-08 ENCOUNTER — Other Ambulatory Visit: Payer: Self-pay | Admitting: Internal Medicine

## 2023-12-08 NOTE — Telephone Encounter (Unsigned)
 Copied from CRM [id number]. Topic: Clinical - Medication Refill >> Dec 08, 2023  8:16 AM Ellen Drake E wrote: Most Recent Primary Care Visit:  Provider: [facility]  Department: [facility]  Visit Type: LAB VISIT  Date: 11/23/2023  Medication: carvedilol  (COREG ) 12.5 MG tablet  Has the patient contacted their pharmacy? Yes (Agent: If no, request that the patient contact the pharmacy for the refill. If patient does not wish to contact the pharmacy document the reason why and proceed with request.) (Agent: If yes, when and what did the pharmacy advise?)  Is this the correct pharmacy for this prescription? Yes If no, delete pharmacy and type the correct one.  This is the patient's preferred pharmacy:  [id number] - [place], [place] - [address] [place] [address] Phone: [phone] Fax: [phone]   Has the prescription been filled recently? No  Is the patient out of the medication? No, 2 doses left.  Has the patient been seen for an appointment in the last year OR does the patient have an upcoming appointment? Yes  Can we respond through MyChart? No  Agent: Please be advised that Rx refills may take up to 3 business days. We ask that you follow-up with your pharmacy.

## 2023-12-09 ENCOUNTER — Encounter: Payer: Self-pay | Admitting: Internal Medicine

## 2023-12-09 ENCOUNTER — Ambulatory Visit: Admitting: Internal Medicine

## 2023-12-09 VITALS — BP 128/74 | HR 78 | Temp 98.0°F | Resp 16 | Ht 65.0 in | Wt 147.2 lb

## 2023-12-09 DIAGNOSIS — R001 Bradycardia, unspecified: Secondary | ICD-10-CM

## 2023-12-09 DIAGNOSIS — R9431 Abnormal electrocardiogram [ECG] [EKG]: Secondary | ICD-10-CM | POA: Insufficient documentation

## 2023-12-09 DIAGNOSIS — K5010 Crohn's disease of large intestine without complications: Secondary | ICD-10-CM

## 2023-12-09 DIAGNOSIS — M1711 Unilateral primary osteoarthritis, right knee: Secondary | ICD-10-CM | POA: Diagnosis not present

## 2023-12-09 DIAGNOSIS — I10 Essential (primary) hypertension: Secondary | ICD-10-CM | POA: Diagnosis not present

## 2023-12-09 DIAGNOSIS — E7800 Pure hypercholesterolemia, unspecified: Secondary | ICD-10-CM | POA: Diagnosis not present

## 2023-12-09 DIAGNOSIS — E559 Vitamin D deficiency, unspecified: Secondary | ICD-10-CM

## 2023-12-09 DIAGNOSIS — E538 Deficiency of other specified B group vitamins: Secondary | ICD-10-CM

## 2023-12-09 DIAGNOSIS — R252 Cramp and spasm: Secondary | ICD-10-CM | POA: Diagnosis not present

## 2023-12-09 DIAGNOSIS — I441 Atrioventricular block, second degree: Secondary | ICD-10-CM

## 2023-12-09 LAB — BASIC METABOLIC PANEL WITH GFR
BUN: 19 mg/dL (ref 6–23)
CO2: 30 meq/L (ref 19–32)
Calcium: 9.7 mg/dL (ref 8.4–10.5)
Chloride: 107 meq/L (ref 96–112)
Creatinine, Ser: 0.9 mg/dL (ref 0.40–1.20)
GFR: 61.43 mL/min (ref >60.00)
Glucose, Bld: 92 mg/dL (ref 70–99)
Potassium: 3.7 meq/L (ref 3.5–5.1)
Sodium: 143 meq/L (ref 135–145)

## 2023-12-09 LAB — MAGNESIUM: Magnesium: 1.9 mg/dL (ref 1.5–2.5)

## 2023-12-09 MED ORDER — CARVEDILOL 12.5 MG PO TABS: 12.5000 mg | ORAL_TABLET | Freq: Two times a day (BID) | ORAL | 3 refills | Status: DC

## 2023-12-09 NOTE — Progress Notes (Signed)
 Subjective:    Patient ID: Ellen Drake, female    DOB: 02-Apr-1946, 78 y.o.   MRN: [id number]  Patient here for  Chief Complaint  Patient presents with   leg cramps    HPI Work in appt - work in for leg cramps. She has been having persistent issues with leg cramps. Have adjusted her magnesium . Previously had reported (when on 400mg  bid of mag oxide) - leg cramps better, but was having increased diarrhea. Changed to magnesium  glycinate. Only taking 400mg  total per day. Increased leg cramps now. Bowels are better. Had a phone conversation with cardiology 11/16/23. Was having some leg swelling and some left shoulder pain. Note reviewed. Recommended for her to have an EKG when she had f/u with her PCP. She denies any chest pain. Breathing stable. Shoulder is better. Leg swelling is better.    Past Medical History:  Diagnosis Date   Anemia    reslved   Colon polyp 2009   Crohn's disease (HCC)    Diverticulosis    Fibrocystic breast disease    Grade I diastolic dysfunction    Herniated nucleus pulposus of lumbosacral region    History of chicken pox    Hypercholesterolemia    Hyperlipidemia    Hypertension    Mild aortic stenosis by prior echocardiogram    Mild mitral regurgitation by prior echocardiogram    Osteoarthritis    Pacemaker    Pulmonary nodule seen on imaging study 2004   resolved 2007   Second degree AV block, Mobitz type II    Past Surgical History:  Procedure Laterality Date   anterior cervical discectomy and fusion  1998   Dr Dwan Girt SURGERY  1992   ruptured disc (Dr Glinda Lapping)   BREAST BIOPSY Left    neg-no scar seen   CATARACT EXTRACTION W/PHACO Right 02/09/2019   Procedure: CATARACT EXTRACTION PHACO AND INTRAOCULAR LENS PLACEMENT (IOC) KAHOOK DUAL BLADE GONIOTOMY  RIGHT HEALON 5, VISION BLUE;  Surgeon: Annell Kidney, MD;  Location: [facility];  Service: Ophthalmology;  Laterality: Right;   CATARACT EXTRACTION W/PHACO Left 11/11/2023    Procedure: PHACOEMULSIFICATION, CATARACT, WITH IOL INSERTION 11.75 01:03.7;  Surgeon: Annell Kidney, MD;  Location: [facility];  Service: Ophthalmology;  Laterality: Left;   COLECTOMY  2003   COLON SURGERY  1998   COLONOSCOPY  2009   COLONOSCOPY W/ BIOPSIES  11/30/2012   Procedure: COLONOSCOPY W/BIOPSY; Surgeon: Lucendia Rusk, MD; Location: [facility]; Service: Gastroenterology;;   COLONOSCOPY W/ BIOPSIES  04/15/2016   Procedure: Colonoscopy ; Surgeon: Lucendia Rusk, MD; Location: [facility]; Service: Gastroenterology; Laterality: N/A;   KNEE ARTHROSCOPY W/ SYNOVECTOMY Right 03/10/2014   limited   KNEE SURGERY Right 1/16   [facility] (Dr. Willard Harman)   PACEMAKER IMPLANT N/A 03/19/2023   Procedure: PACEMAKER IMPLANT;  Surgeon: Lei Pump, MD;  Location: [facility];  Service: Cardiovascular;  Laterality: N/A;   POSTERIOR LAMINECTOMY / DECOMPRESSION LUMBAR SPINE  1999   REPLACEMENT TOTAL KNEE     ROTATOR CUFF REPAIR Left 2007   acute open; accident   Family History  Problem Relation Age of Onset   CVA Mother    Hypertension Mother    Hypertension Sister        brain tumor   Breast cancer Neg Hx    Colon cancer Neg Hx    Social History   Socioeconomic History   Marital status: Married  Spouse name: Not on file   Number of children: 0   Years of education: 57   Highest education level: Not on file  Occupational History   Not on file  Tobacco Use   Smoking status: Former    Current packs/day: 0.00    Types: Cigarettes    Quit date: 09/10/2009    Years since quitting: 14.2   Smokeless tobacco: Never  Vaping Use   Vaping status: Never Used  Substance and Sexual Activity   Alcohol use: Yes    Alcohol/week: 1.0 standard drink of alcohol    Types: 1 Glasses of wine per week    Comment: wine occasional; socially   Drug use: No   Sexual activity: Yes  Other Topics Concern   Not on file  Social History Narrative    married   Social Drivers of Corporate investment banker Strain: Low Risk  (10/15/2023)   Received from [facility]   Overall Financial Resource Strain (CARDIA)    Difficulty of Paying Living Expenses: Not hard at all  Food Insecurity: No Food Insecurity (10/15/2023)   Received from [facility]   Hunger Vital Sign    Worried About Running Out of Food in the Last Year: Never true    Ran Out of Food in the Last Year: Never true  Transportation Needs: No Transportation Needs (10/15/2023)   [facility] from [facility]    In the past 12 months, has lack of transportation kept you from medical appointments or from getting medications?: No    Lack of Transportation (Non-Medical): No  Physical Activity: Inactive (07/24/2023)   Exercise Vital Sign    Days of Exercise per Week: 0 days    Minutes of Exercise per Session: 0 min  Stress: No Stress Concern Present (07/24/2023)   [employer] of Occupational Health - Occupational Stress Questionnaire    Feeling of Stress : Not at all  Social Connections: Socially Integrated (07/24/2023)   Social Connection and Isolation Panel [NHANES]    Frequency of Communication with Friends and Family: More than three times a week    Frequency of Social Gatherings with Friends and Family: Twice a week    Attends Religious Services: More than 4 times per year    Active Member of [employer] or Organizations: Yes    Attends Engineer, structural: More than 4 times per year    Marital Status: Married     Review of Systems  Constitutional:  Negative for appetite change and unexpected weight change.  HENT:  Negative for congestion and sinus pressure.   Respiratory:  Negative for cough, chest tightness and shortness of breath.   Cardiovascular:  Negative for chest pain and palpitations.  Gastrointestinal:  Negative for abdominal pain, nausea and vomiting.  Genitourinary:   Negative for difficulty urinating and dysuria.  Musculoskeletal:        Leg cramps as outlined. Chronic knee pain.   Skin:  Negative for color change and rash.  Neurological:  Negative for dizziness and headaches.  Psychiatric/Behavioral:  Negative for agitation and dysphoric mood.        Objective:     BP 128/74   Pulse 78   Temp 98 F (36.7 C)   Resp 16   Ht 5\' 5"  (1.651 m)   Wt 147 lb 3.2 oz (66.8 kg)   LMP 08/13/1995   SpO2 97%   BMI 24.50 kg/m  Wt Readings  from Last 3 Encounters:  12/09/23 147 lb 3.2 oz (66.8 kg)  11/11/23 149 lb (67.6 kg)  10/15/23 148 lb 6.4 oz (67.3 kg)    Physical Exam Vitals reviewed.  Constitutional:      General: She is not in acute distress.    Appearance: Normal appearance.  HENT:     Head: Normocephalic and atraumatic.     Right Ear: External ear normal.     Left Ear: External ear normal.     Mouth/Throat:     Pharynx: No oropharyngeal exudate or posterior oropharyngeal erythema.  Eyes:     General: No scleral icterus.       Right eye: No discharge.        Left eye: No discharge.     Conjunctiva/sclera: Conjunctivae normal.  Neck:     Thyroid : No thyromegaly.  Cardiovascular:     Rate and Rhythm: Normal rate and regular rhythm.  Pulmonary:     Effort: No respiratory distress.     Breath sounds: Normal breath sounds. No wheezing.  Abdominal:     General: Bowel sounds are normal.     Palpations: Abdomen is soft.     Tenderness: There is no abdominal tenderness.  Musculoskeletal:        General: No tenderness.     Cervical back: Neck supple. No tenderness.  Lymphadenopathy:     Cervical: No cervical adenopathy.  Skin:    Findings: No erythema or rash.  Neurological:     Mental Status: She is alert.  Psychiatric:        Mood and Affect: Mood normal.        Behavior: Behavior normal.         Outpatient Encounter Medications as of 12/09/2023  Medication Sig   amLODipine  (NORVASC ) 10 MG tablet Take 1 tablet (10 mg  total) by mouth daily.   ASPIRIN 81 PO Take 1 tablet by mouth daily.   carvedilol  (COREG ) 12.5 MG tablet Take 1 tablet (12.5 mg total) by mouth 2 (two) times daily.   folic acid (FOLVITE) 1 MG tablet Take 1 mg by mouth daily.   hydrochlorothiazide  (MICROZIDE ) 12.5 MG capsule Take 1 capsule (12.5 mg total) by mouth daily.   latanoprost  (XALATAN ) 0.005 % ophthalmic solution Place 1 drop into both eyes at bedtime.   MAGNESIUM  GLYCINATE PO Take 200 mg by mouth 2 (two) times daily.   potassium chloride  (KLOR-CON  M10) 10 MEQ tablet Take 2 tablets (20 mEq total) by mouth 2 (two) times daily.   rosuvastatin  (CRESTOR ) 10 MG tablet Take 1 tablet (10 mg total) by mouth daily.   sulfaSALAzine  (AZULFIDINE ) 500 MG tablet Take 1,500 mg by mouth 2 (two) times daily. 3 tabs   timolol  (TIMOPTIC ) 0.5 % ophthalmic solution Place 1 drop into both eyes 2 (two) times daily.   traMADol  (ULTRAM ) 50 MG tablet Take 1 tablet (50 mg total) by mouth 2 (two) times daily. Each refill must last 30 days.   triamcinolone  cream (KENALOG ) 0.1 % Apply 1 Application topically as needed (prn). As needed   [DISCONTINUED] albuterol  (VENTOLIN  HFA) 108 (90 Base) MCG/ACT inhaler Inhale 2 puffs into the lungs every 6 (six) hours as needed for wheezing or shortness of breath.   [DISCONTINUED] carvedilol  (COREG ) 12.5 MG tablet Take 1 tablet (12.5 mg total) by mouth 2 (two) times daily.   [DISCONTINUED] fluticasone  (FLOVENT  HFA) 110 MCG/ACT inhaler Inhale 2 puffs into the lungs in the morning and at bedtime.   [DISCONTINUED] latanoprost  (XALATAN ) 0.005 %  ophthalmic solution Place 1 drop into both eyes at bedtime.    [DISCONTINUED] magnesium  oxide (MAG-OX) 400 (240 Mg) MG tablet Take two tablets by mouth every morning and 1 tablet every evening   [DISCONTINUED] Vitamin D , Ergocalciferol , (DRISDOL ) 1.25 MG (50000 UNIT) CAPS capsule Take 1 capsule (50,000 Units total) by mouth every 7 (seven) days.   No facility-administered encounter medications  on file as of 12/09/2023.     Lab Results  Component Value Date   WBC 7.4 10/15/2023   HGB 12.2 10/15/2023   HCT 36.3 10/15/2023   PLT 179.0 10/15/2023   GLUCOSE 92 12/09/2023   CHOL 169 10/15/2023   TRIG 107.0 10/15/2023   HDL 72.30 10/15/2023   LDLDIRECT 132.5 05/06/2013   LDLCALC 76 10/15/2023   ALT 9 10/15/2023   AST 20 10/15/2023   NA 143 12/09/2023   K 3.7 12/09/2023   CL 107 12/09/2023   CREATININE 0.90 12/09/2023   BUN 19 12/09/2023   CO2 30 12/09/2023   TSH 2.22 05/13/2023   HGBA1C 5.7 10/19/2012       Assessment & Plan:  Leg cramps Assessment & Plan: She has been having persistent issues with leg cramps. Have adjusted her magnesium . Previously had reported (when on 400mg  bid of mag oxide) - leg cramps better, but was having increased diarrhea. Changed to magnesium  glycinate. Only taking 400mg  total per day. Increased leg cramps now. Check electrolytes and magnesium  today.   Orders: -     Magnesium  -     Basic metabolic panel with GFR  Bradycardia Assessment & Plan: EKG as outlined.  See below.   Orders: -     EKG 12-Lead  Vitamin D  deficiency Assessment & Plan: Vitamin D  level 11/04/23 - wnl.    Vitamin B12 deficiency Assessment & Plan: Continue B12 injections.    Second degree AV block, Mobitz type II Assessment & Plan: S/p pacemaker. Was asked to have a f/u EKG here in the office. No acute symptoms. EKG - SR with LBBB. Faxed to cardiology for review. Plan to f/u with her as outpatient. No acute intervention warranted.    Primary osteoarthritis of right knee Assessment & Plan: Has had several surgeries on her knee.  Now being followed by pain clinic.     Primary hypertension Assessment & Plan: Continues carvedilol , hydrochlorothiazide . Per note, taking amlodipine . Blood pressure doing well. Follow pressures.  Follow metabolic panel. Check today. If persistent leg cramps, consider stopping hydrochlorothiazide .     Hypercholesterolemia Assessment & Plan: Continue crestor . Follow lipid panel and liver function tests.    Crohn's disease of large intestine without complication [facility]) Assessment & Plan: Bowels stable. Doing well. Has been followed by GI ([facility]). Continue sulfasalazine .       Dellar Fenton, MD

## 2023-12-10 ENCOUNTER — Other Ambulatory Visit: Payer: Self-pay | Admitting: Nurse Practitioner

## 2023-12-10 DIAGNOSIS — B338 Other specified viral diseases: Secondary | ICD-10-CM

## 2023-12-13 ENCOUNTER — Encounter: Payer: Self-pay | Admitting: Internal Medicine

## 2023-12-13 NOTE — Assessment & Plan Note (Signed)
 Continue B12 injections.

## 2023-12-13 NOTE — Assessment & Plan Note (Signed)
 S/p pacemaker. Was asked to have a f/u EKG here in the office. No acute symptoms. EKG - SR with LBBB. Faxed to cardiology for review. Plan to f/u with her as outpatient. No acute intervention warranted.

## 2023-12-13 NOTE — Assessment & Plan Note (Signed)
 Continue crestor. Follow lipid panel and liver function tests.

## 2023-12-13 NOTE — Assessment & Plan Note (Signed)
 Vitamin D  level 11/04/23 - wnl.

## 2023-12-13 NOTE — Assessment & Plan Note (Signed)
 Continues carvedilol , hydrochlorothiazide . Per note, taking amlodipine . Blood pressure doing well. Follow pressures.  Follow metabolic panel. Check today. If persistent leg cramps, consider stopping hydrochlorothiazide .

## 2023-12-13 NOTE — Assessment & Plan Note (Signed)
Has had several surgeries on her knee.  Now being followed by pain clinic.

## 2023-12-13 NOTE — Assessment & Plan Note (Signed)
 EKG as outlined.  See below.

## 2023-12-13 NOTE — Assessment & Plan Note (Signed)
 She has been having persistent issues with leg cramps. Have adjusted her magnesium . Previously had reported (when on 400mg  bid of mag oxide) - leg cramps better, but was having increased diarrhea. Changed to magnesium  glycinate. Only taking 400mg  total per day. Increased leg cramps now. Check electrolytes and magnesium  today.

## 2023-12-13 NOTE — Assessment & Plan Note (Signed)
 Bowels stable. Doing well. Has been followed by GI ([facility]). Continue sulfasalazine.

## 2023-12-16 ENCOUNTER — Telehealth: Payer: Self-pay

## 2023-12-16 NOTE — Telephone Encounter (Signed)
 Copied from CRM [id number]. Topic: General - Other >> Dec 16, 2023 12:28 PM [name] wrote: Reason for CRM: Patient is calling in regards to a missed call from nurse Milana Ali and was advised to call back. I informed the patient of her labs results from Dr.Scott. Patient stated that she wanted to let the nurse know that her feet and legs are still cramping at night.

## 2023-12-17 NOTE — Telephone Encounter (Signed)
 This was copied into pt's lab result note

## 2023-12-18 ENCOUNTER — Telehealth: Payer: Self-pay

## 2023-12-18 ENCOUNTER — Ambulatory Visit (INDEPENDENT_AMBULATORY_CARE_PROVIDER_SITE_OTHER): Payer: Medicare HMO

## 2023-12-18 ENCOUNTER — Telehealth: Payer: Self-pay | Admitting: Cardiology

## 2023-12-18 DIAGNOSIS — I441 Atrioventricular block, second degree: Secondary | ICD-10-CM

## 2023-12-18 NOTE — Telephone Encounter (Signed)
 Spoke with pt and she stated that she was advised last week to increase the Magnesium  Glycinate to 400 mg BID. Pt has been doing that and states she is still having cramps. The cramps are in her legs, feet, and hands. Pt stated that they were so bad last night that she did not sleep.

## 2023-12-18 NOTE — Telephone Encounter (Signed)
 Patient stated she had a pacemaker installed and is having issues with cramping in her hands, legs, and feet. Patient stated she is sore where the device was implanted. Patient mentioned she was seen by her PCP for the cramping and stated they ran an EKG and the results were fine. Please advise.

## 2023-12-18 NOTE — Telephone Encounter (Signed)
 Spoke to patient she stated she has been having cramping in both feet and lower legs.She is having cramping in arms too.Stated she has noticed cramping since she had pacemaker 1 year ago.Advised she should see PCP.Stated she has appointment with [facility] 6/23.She would like sooner appointment.Advised Dr.Camnitz's RN is out of office today.I will send message to her.

## 2023-12-18 NOTE — Telephone Encounter (Signed)
 Copied from CRM [id number]. Topic: General - Other >> Dec 18, 2023  9:28 AM Ellen Drake wrote: Reason for CRM: PATIENT IS NEEDING BACK REGARDING LEGS CRAMPING AND FEET AND HANDS SHE WAS UNABLE TO SLEEP AS WELL

## 2023-12-19 NOTE — Telephone Encounter (Signed)
 Late entry - called and discussed with her regarding cramps. Will continue mag glycinate at 400mg  q day. She will hold her cholesterol medication. Call with update.

## 2023-12-21 LAB — CUP PACEART REMOTE DEVICE CHECK
Battery Remaining Longevity: 135 mo
Battery Voltage: 3.1 V
Brady Statistic AP VP Percent: 9.81 %
Brady Statistic AP VS Percent: 1.82 %
Brady Statistic AS VP Percent: 55.31 %
Brady Statistic AS VS Percent: 33.06 %
Brady Statistic RA Percent Paced: 11.62 %
Brady Statistic RV Percent Paced: 65.12 %
Date Time Interrogation Session: 20250509150418
Implantable Lead Connection Status: 753985
Implantable Lead Connection Status: 753985
Implantable Lead Implant Date: 20240808
Implantable Lead Implant Date: 20240808
Implantable Lead Location: 753859
Implantable Lead Location: 753860
Implantable Lead Model: 3830
Implantable Lead Model: 5076
Implantable Pulse Generator Implant Date: 20240808
Lead Channel Impedance Value: 304 Ohm
Lead Channel Impedance Value: 361 Ohm
Lead Channel Impedance Value: 380 Ohm
Lead Channel Impedance Value: 513 Ohm
Lead Channel Pacing Threshold Amplitude: 0.5 V
Lead Channel Pacing Threshold Amplitude: 1.25 V
Lead Channel Pacing Threshold Pulse Width: 0.4 ms
Lead Channel Pacing Threshold Pulse Width: 0.4 ms
Lead Channel Sensing Intrinsic Amplitude: 18.75 mV
Lead Channel Sensing Intrinsic Amplitude: 18.75 mV
Lead Channel Sensing Intrinsic Amplitude: 4 mV
Lead Channel Sensing Intrinsic Amplitude: 4 mV
Lead Channel Setting Pacing Amplitude: 1.5 V
Lead Channel Setting Pacing Amplitude: 2.5 V
Lead Channel Setting Pacing Pulse Width: 0.4 ms
Lead Channel Setting Sensing Sensitivity: 1.2 mV
Zone Setting Status: 755011

## 2023-12-22 NOTE — Progress Notes (Unsigned)
 PROVIDER NOTE: Interpretation of information contained herein should be left to medically-trained personnel. Specific patient instructions are provided elsewhere under "Patient Instructions" section of medical record. This document was created in part using AI and STT-dictation technology, any transcriptional errors that may result from this process are unintentional.  Patient: Ellen Drake  Service: E/M   PCP: Dellar Fenton, MD  DOB: Aug 25, 1945  DOS: 12/23/2023  Provider: Cherylin Corrigan, NP  MRN: [id number]  Delivery: Face-to-face  Specialty: Interventional Pain Management  Type: Established Patient  Setting: Ambulatory outpatient facility  Specialty designation: 09  Referring Prov.: Dellar Fenton, MD  Location: Outpatient office facility       HPI  Ellen Drake, a 78 y.o. year old female, is here today because of her No primary diagnosis found.. Ms. Stegall's primary complain today is No chief complaint on file.  Pertinent problems: Ellen Drake does not have any pertinent problems on file. Pain Assessment: Severity of   is reported as a  /10. Location:    / . Onset:  . Quality:  . Timing:  . Modifying factor(s):  Aaron Aas Vitals:  vitals were not taken for this visit.  BMI: Estimated body mass index is 24.5 kg/m as calculated from the following:   Height as of 12/09/23: 5\' 5"  (1.651 m).   Weight as of 12/09/23: 147 lb 3.2 oz (66.8 kg). Last encounter: Visit date not found. Last procedure: Visit date not found.  Reason for encounter: medication management. ***  Discussed the use of AI scribe software for clinical note transcription with the patient, who gave verbal consent to proceed.  History of Present Illness           Pharmacotherapy Assessment  Analgesic: {There is no content from the last Subjective section.}   Monitoring: [place] PMP: PDMP reviewed during this encounter.       Pharmacotherapy: No side-effects or adverse reactions reported. Compliance: No problems  identified. Effectiveness: Clinically acceptable.  No notes on file  No results found for: "CBDTHCR" No results found for: "D8THCCBX" No results found for: "D9THCCBX"  UDS:  Summary  Date Value Ref Range Status  05/27/2023 Note  Final    Comment:    ==================================================================== [employer] Select 13 (MW) ==================================================================== Test                             Result       Flag       Units  Drug Present and Declared for Prescription Verification   Tramadol                        >3185        EXPECTED   ng/mg creat   O-Desmethyltramadol            >3185        EXPECTED   ng/mg creat   N-Desmethyltramadol            468          EXPECTED   ng/mg creat    Source of tramadol  is a prescription medication. O-desmethyltramadol    and N-desmethyltramadol are expected metabolites of tramadol .  ==================================================================== Test                      Result    Flag   Units      Ref Range   Creatinine  157              mg/dL      >=40 ==================================================================== Declared Medications:  The flagging and interpretation on this report are based on the  following declared medications.  Unexpected results may arise from  inaccuracies in the declared medications.   **Note: The testing scope of this panel includes these medications:   Tramadol  (Ultram )   **Note: The testing scope of this panel does not include the  following reported medications:   Amlodipine  (Norvasc )  Aspirin  Carvedilol  (Coreg )  Hydrochlorothiazide   Latanoprost  (Xalatan )  Magnesium  (Mag-Ox)  Potassium (Klor-Con )  Rosuvastatin  (Crestor )  Sulfasalazine  (Azulfidine )  Timolol  (Timoptic )  Triamcinolone  (Kenalog ) ==================================================================== For clinical consultation, please call [phone]. ====================================================================       ROS  Constitutional: Denies any fever or chills Gastrointestinal: No reported hemesis, hematochezia, vomiting, or acute GI distress Musculoskeletal: Denies any acute onset joint swelling, redness, loss of ROM, or weakness Neurological: No reported episodes of acute onset apraxia, aphasia, dysarthria, agnosia, amnesia, paralysis, loss of coordination, or loss of consciousness  Medication Review  Aspirin, Magnesium  Glycinate, amLODipine , carvedilol , folic acid, hydrochlorothiazide , latanoprost , potassium chloride , rosuvastatin , sulfaSALAzine , timolol , traMADol , and triamcinolone  cream  History Review  Allergy: Ellen Drake is allergic to mercaptopurine, lyrica [pregabalin], and dilaudid  [hydromorphone  hcl]. Drug: Ellen Drake  reports no history of drug use. Alcohol:  reports current alcohol use of about 1.0 standard drink of alcohol per week. Tobacco:  reports that she quit smoking about 14 years ago. Her smoking use included cigarettes. She has never used smokeless tobacco. Social: Ellen Drake  reports that she quit smoking about 14 years ago. Her smoking use included cigarettes. She has never used smokeless tobacco. She reports current alcohol use of about 1.0 standard drink of alcohol per week. She reports that she does not use drugs. Medical:  has a past medical history of Anemia, Colon polyp (2009), Crohn's disease (HCC), Diverticulosis, Fibrocystic breast disease, Grade I diastolic dysfunction, Herniated nucleus pulposus of lumbosacral region, History of chicken pox, Hypercholesterolemia, Hyperlipidemia, Hypertension, Mild aortic stenosis by prior echocardiogram, Mild mitral regurgitation by prior echocardiogram, Osteoarthritis, Pacemaker, Pulmonary nodule seen on imaging study (2004), and Second degree AV block, Mobitz type II. Surgical: Ellen Drake  has a past surgical history that includes Back surgery  (1992); anterior cervical discectomy and fusion (1998); Knee surgery (Right, 1/16); Colonoscopy (2009); Colon surgery (1998); Posterior laminectomy / decompression lumbar spine (1999); Rotator cuff repair (Left, 2007); Colonoscopy w/ biopsies (11/30/2012); Colectomy (2003); Replacement total knee; Knee arthroscopy w/ synovectomy (Right, 03/10/2014); Colonoscopy w/ biopsies (04/15/2016); Cataract extraction w/PHACO (Right, 02/09/2019); Breast biopsy (Left); PACEMAKER IMPLANT (N/A, 03/19/2023); and Cataract extraction w/PHACO (Left, 11/11/2023). Family: family history includes CVA in her mother; Hypertension in her mother and sister.  Laboratory Chemistry Profile   Renal Lab Results  Component Value Date   BUN 19 12/09/2023   CREATININE 0.90 12/09/2023   GFR 61.43 12/09/2023   GFRNONAA 58 (L) 03/20/2023    Hepatic Lab Results  Component Value Date   AST 20 10/15/2023   ALT 9 10/15/2023   ALBUMIN 4.2 10/15/2023   ALKPHOS 57 10/15/2023    Electrolytes Lab Results  Component Value Date   NA 143 12/09/2023   K 3.7 12/09/2023   CL 107 12/09/2023   CALCIUM  9.7 12/09/2023   MG 1.9 12/09/2023    Bone Lab Results  Component Value Date   VD25OH 40.55 10/15/2023    Inflammation (CRP: Acute Phase) (ESR: Chronic Phase) Lab Results  Component Value Date   CRP 1.7 10/15/2023   ESRSEDRATE 27 10/15/2023         Note: Above Lab results reviewed.  Recent Imaging Review  CUP PACEART REMOTE DEVICE CHECK PPM Scheduled remote reviewed. Normal device function.  Presenting rhythm:  AS/VS 1 NSVT, HR 162, 7 beats in duration Next remote 91 days. LA, CVRS Note: Reviewed         Physical Exam  General appearance: Well nourished, well developed, and well hydrated. In no apparent acute distress Mental status: Alert, oriented x 3 (person, place, & time)       Respiratory: No evidence of acute respiratory distress Eyes: PERLA Vitals: LMP 08/13/1995  BMI: Estimated body mass index is 24.5 kg/m as  calculated from the following:   Height as of 12/09/23: 5\' 5"  (1.651 m).   Weight as of 12/09/23: 147 lb 3.2 oz (66.8 kg). Ideal: Ideal body weight: 57 kg (125 lb 10.6 oz) Adjusted ideal body weight: 60.9 kg (134 lb 4.4 oz)  Assessment   Diagnosis Status  No diagnosis found. Controlled Controlled Controlled   Updated Problems: No problems updated.  Plan of Care  Problem-specific:  Assessment and Plan            Ms. Rashawn Day Luber has a current medication list which includes the following long-term medication(s): amlodipine , carvedilol , hydrochlorothiazide , potassium chloride , rosuvastatin , sulfasalazine , and tramadol .  Pharmacotherapy (Medications Ordered): No orders of the defined types were placed in this encounter.  Orders:  No orders of the defined types were placed in this encounter.  Follow-up plan:   No follow-ups on file.     {There is no content from the last Plan section.}   Recent Visits No visits were found meeting these conditions. Showing recent visits within past 90 days and meeting all other requirements Future Appointments Date Type Provider Dept  12/23/23 Appointment Kimari Lienhard K, NP [facility]  Showing future appointments within next 90 days and meeting all other requirements   I discussed the assessment and treatment plan with the patient. The patient was provided an opportunity to ask questions and all were answered. The patient agreed with the plan and demonstrated an understanding of the instructions.  Patient advised to call back or seek an in-person evaluation if the symptoms or condition worsens.  Duration of encounter: *** minutes.  Total time on encounter, as per AMA guidelines included both the face-to-face and non-face-to-face time personally spent by the physician and/or other qualified health care professional(s) on the day of the encounter (includes time in activities that require the physician or other qualified health  care professional and does not include time in activities normally performed by clinical staff). Physician's time may include the following activities when performed: Preparing to see the patient (e.g., pre-charting review of records, searching for previously ordered imaging, lab work, and nerve conduction tests) Review of prior analgesic pharmacotherapies. Reviewing PMP Interpreting ordered tests (e.g., lab work, imaging, nerve conduction tests) Performing post-procedure evaluations, including interpretation of diagnostic procedures Obtaining and/or reviewing separately obtained history Performing a medically appropriate examination and/or evaluation Counseling and educating the patient/family/caregiver Ordering medications, tests, or procedures Referring and communicating with other health care professionals (when not separately reported) Documenting clinical information in the electronic or other health record Independently interpreting results (not separately reported) and communicating results to the patient/ family/caregiver Care coordination (not separately reported)  Note by: Janissa Bertram K Derricka Mertz, NP (TTS and AI technology used. I apologize for any typographical errors that  were not detected and corrected.) Date: 12/23/2023; Time: 3:18 PM

## 2023-12-23 ENCOUNTER — Ambulatory Visit: Attending: Pain Medicine | Admitting: Nurse Practitioner

## 2023-12-23 ENCOUNTER — Encounter: Payer: Self-pay | Admitting: Nurse Practitioner

## 2023-12-23 DIAGNOSIS — Z79891 Long term (current) use of opiate analgesic: Secondary | ICD-10-CM

## 2023-12-23 DIAGNOSIS — G894 Chronic pain syndrome: Secondary | ICD-10-CM | POA: Diagnosis not present

## 2023-12-23 DIAGNOSIS — G8929 Other chronic pain: Secondary | ICD-10-CM | POA: Insufficient documentation

## 2023-12-23 DIAGNOSIS — M25561 Pain in right knee: Secondary | ICD-10-CM | POA: Diagnosis not present

## 2023-12-23 DIAGNOSIS — Z79899 Other long term (current) drug therapy: Secondary | ICD-10-CM | POA: Diagnosis not present

## 2023-12-23 DIAGNOSIS — Z96651 Presence of right artificial knee joint: Secondary | ICD-10-CM

## 2023-12-23 MED ORDER — TRAMADOL HCL 50 MG PO TABS: 50.0000 mg | ORAL_TABLET | Freq: Two times a day (BID) | ORAL | 5 refills | Status: DC

## 2023-12-23 NOTE — Progress Notes (Signed)
 Nursing Pain Medication Assessment:  Safety precautions to be maintained throughout the outpatient stay will include: orient to surroundings, keep bed in low position, maintain call bell within reach at all times, provide assistance with transfer out of bed and ambulation.  Medication Inspection Compliance: Pill count conducted under aseptic conditions, in front of the patient. Neither the pills nor the bottle was removed from the patient's sight at any time. Once count was completed pills were immediately returned to the patient in their original bottle.  Medication: Tramadol  (Ultram ) Pill/Patch Count: 0 of 60 pills/patches remain Pill/Patch Appearance: Markings consistent with prescribed medication Bottle Appearance: Standard pharmacy container. Clearly labeled. Filled Date: 4 / 1 / 2025 Last Medication intake:  run out the middle of aprilSafety precautions to be maintained throughout the outpatient stay will include: orient to surroundings, keep bed in low position, maintain call bell within reach at all times, provide assistance with transfer out of bed and ambulation.

## 2023-12-24 ENCOUNTER — Ambulatory Visit: Attending: Cardiology | Admitting: Cardiology

## 2023-12-24 ENCOUNTER — Encounter: Payer: Self-pay | Admitting: Cardiology

## 2023-12-24 VITALS — BP 114/76 | HR 77 | Ht 66.0 in | Wt 148.6 lb

## 2023-12-24 DIAGNOSIS — I441 Atrioventricular block, second degree: Secondary | ICD-10-CM

## 2023-12-24 DIAGNOSIS — Z950 Presence of cardiac pacemaker: Secondary | ICD-10-CM

## 2023-12-24 DIAGNOSIS — T8141XA Infection following a procedure, superficial incisional surgical site, initial encounter: Secondary | ICD-10-CM

## 2023-12-24 LAB — CUP PACEART INCLINIC DEVICE CHECK
Date Time Interrogation Session: 20250515160508
Implantable Lead Connection Status: 753985
Implantable Lead Connection Status: 753985
Implantable Lead Implant Date: 20240808
Implantable Lead Implant Date: 20240808
Implantable Lead Location: 753859
Implantable Lead Location: 753860
Implantable Lead Model: 3830
Implantable Lead Model: 5076
Implantable Pulse Generator Implant Date: 20240808

## 2023-12-24 MED ORDER — CEPHALEXIN 500 MG PO CAPS: 500.0000 mg | ORAL_CAPSULE | Freq: Four times a day (QID) | ORAL | 0 refills | Status: AC

## 2023-12-24 NOTE — Patient Instructions (Signed)
 Medication Instructions:  TAKE Keflex 500 mg four times daily for 7 days.   *If you need a refill on your cardiac medications before your next appointment, please call your pharmacy*  Follow-Up: At [facility], you and your health needs are our priority.  As part of our continuing mission to provide you with exceptional heart care, our providers are all part of one team.  This team includes your primary Cardiologist (physician) and Advanced Practice Providers or APPs (Physician Assistants and Nurse Practitioners) who all work together to provide you with the care you need, when you need it.  Your next appointment:   3-4 week(s)  Provider:   Suzann Riddle, NP    We recommend signing up for the patient portal called "MyChart".  Sign up information is provided on this After Visit Summary.  MyChart is used to connect with patients for Virtual Visits (Telemedicine).  Patients are able to view lab/test results, encounter notes, upcoming appointments, etc.  Non-urgent messages can be sent to your provider as well.   To learn more about what you can do with MyChart, go to [email or web address].

## 2023-12-24 NOTE — Progress Notes (Signed)
 Electrophysiology Clinic Note    Date:  12/24/2023  Patient ID:  Ellen Drake, Ellen Drake 1946/02/09, MRN [id number] PCP:  Dellar Fenton, MD  Cardiologist:  Sheryle Donning, MD Electrophysiologist: Lei Pump, MD   Discussed the use of AI scribe software for clinical note transcription with the patient, who gave verbal consent to proceed.   Patient Profile    Chief Complaint: pain at [facility] site  History of Present Illness: Ellen Drake is a 78 y.o. 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    She was first eval'd by EP 03/2023 where she presented to ER from PCP office after found to be in 2:1 HB on atenolol . HB continued after atenolol  held. PPM implanted 03/2023. She saw PA Tillery 06/2023 for routine follow-up, at that appt she c/o off and on tenderness to PPM site.   On follow-up today, she believes her pacemaker has become more painful over the past few weeks. She has shoulder arthritis and sometimes has a hard time distinguishing between L shoulder pain and the L chest PPM site. She does note that the incision is painful when she takes a shower and a washcloth runs over the incision, or when the water hits it.  She denies fever or chills, incisional drainage, incisional warmth, or swelling to the area.  She continues to have fatigue since PPM implanted. She thought it would help with her fatigue.  She denies chest pressure, palpitations, N/V/D, dizziness or LH.    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 All other systems are reviewed and otherwise negative.    Physical Exam    VS:  BP 114/76   Pulse 77   Ht 5\' 6"  (1.676 m)   Wt 148 lb 9.6 oz (67.4 kg)   LMP 08/13/1995   SpO2 95%   BMI 23.98 kg/m  BMI: Body mass index  is 23.98 kg/m.  Wt Readings from Last 3 Encounters:  12/24/23 148 lb 9.6 oz (67.4 kg)  12/23/23 147 lb (66.7 kg)  12/09/23 147 lb 3.2 oz (66.8 kg)     GEN- The patient is well appearing, alert and oriented x 3 today.   Lungs- Clear to ausculation bilaterally, normal work of breathing.  Heart- Regular rate and rhythm, no murmurs, rubs or gallops Extremities- No peripheral edema, warm, dry Skin-  device pocket no tethering, no edema or fluctuance, no erythema. Tender to palpation.     Device interrogation done today and reviewed by myself:  Battery 11 years Lead thresholds, impedence, sensing stable  Histograms appropriate No recent episodes No changes made today   Studies Reviewed   Previous EP, cardiology notes.    EKG is not ordered. Personal review of EKG from 12/09/2023 shows:  SR at 71bpm       TTE, 03/17/2023  1. Intracavitary gradient. Peak gradient 1.7 m/s. Peak gradient 5.5 mmHg. Left ventricular ejection fraction, by estimation, is 60 to 65%. The left ventricle has normal function. The left ventricle has no regional wall motion abnormalities. Left ventricular diastolic parameters are consistent with Grade I diastolic dysfunction (impaired relaxation).   2. Right ventricular systolic function is normal. The right ventricular size is normal.   3. Left atrial size was  mildly dilated.   4. The mitral valve is normal in structure. Mild mitral valve regurgitation. No evidence of mitral stenosis.   5. Aortic outflow gradients are mildly elevated. The aortic valve appears normal. Cannot exclude a subvalvular or supravalvular membrane. The aortic valve is tricuspid. Aortic valve regurgitation is not visualized. Mild aortic valve stenosis. Aortic valve area, by VTI measures 2.03 cm. Aortic valve mean gradient measures 10.0 mmHg. Aortic valve Vmax measures 2.23 m/s.   6. The inferior vena cava is normal in size with greater than 50% respiratory variability, suggesting right atrial  pressure of 3 mmHg.      Assessment and Plan     #) SND s/p PPM #) incisional pain #) superficial skin infection Pacemaker functioning well, see pace art for details Incision appears well-healed, though having discomfort with palpation Start 500mg  keflex 4 times a day x 7 days Close follow-up scheduled to monitor    #) fatigue Recommended she follow-up with PCP for ongoing evaluation PPM has appropriate HR histograms      Current medicines are reviewed at length with the patient today.   The patient does not have concerns regarding her medicines.  The following changes were made today:   START keflex 500mg  four times a day for 7 days  Labs/ tests ordered today include:  No orders of the defined types were placed in this encounter.    Disposition: Follow up with EP APP in 4 weeks   Signed, Rosibel Giacobbe, NP  12/24/23  4:01 PM  Electrophysiology [facility]

## 2023-12-24 NOTE — Telephone Encounter (Signed)
 Apologized to pt for the delay in getting back with her on this, informed I was unexpectedly out of the office since she called.  Pt has an appt today to see EP APP for further evaluation of her concern/s.  She appreciates the follow up call.

## 2023-12-27 ENCOUNTER — Ambulatory Visit: Payer: Self-pay | Admitting: Cardiology

## 2024-01-05 ENCOUNTER — Ambulatory Visit (INDEPENDENT_AMBULATORY_CARE_PROVIDER_SITE_OTHER): Admitting: Internal Medicine

## 2024-01-05 VITALS — BP 132/70 | HR 70 | Resp 16 | Ht 66.0 in | Wt 149.6 lb

## 2024-01-05 DIAGNOSIS — D649 Anemia, unspecified: Secondary | ICD-10-CM

## 2024-01-05 DIAGNOSIS — I10 Essential (primary) hypertension: Secondary | ICD-10-CM

## 2024-01-05 DIAGNOSIS — K5010 Crohn's disease of large intestine without complications: Secondary | ICD-10-CM

## 2024-01-05 DIAGNOSIS — M7989 Other specified soft tissue disorders: Secondary | ICD-10-CM | POA: Diagnosis not present

## 2024-01-05 DIAGNOSIS — M25561 Pain in right knee: Secondary | ICD-10-CM

## 2024-01-05 DIAGNOSIS — E538 Deficiency of other specified B group vitamins: Secondary | ICD-10-CM | POA: Diagnosis not present

## 2024-01-05 DIAGNOSIS — G8929 Other chronic pain: Secondary | ICD-10-CM

## 2024-01-05 DIAGNOSIS — R252 Cramp and spasm: Secondary | ICD-10-CM

## 2024-01-05 DIAGNOSIS — I441 Atrioventricular block, second degree: Secondary | ICD-10-CM

## 2024-01-05 DIAGNOSIS — Z1231 Encounter for screening mammogram for malignant neoplasm of breast: Secondary | ICD-10-CM

## 2024-01-05 DIAGNOSIS — E7800 Pure hypercholesterolemia, unspecified: Secondary | ICD-10-CM

## 2024-01-05 DIAGNOSIS — M25512 Pain in left shoulder: Secondary | ICD-10-CM

## 2024-01-05 NOTE — Assessment & Plan Note (Signed)
 Continue vitamin B12 supplements

## 2024-01-05 NOTE — Patient Instructions (Signed)
 Decrease amlodipine  to 1/2 (10mg ) tablet.

## 2024-01-05 NOTE — Progress Notes (Signed)
 Subjective:    Patient ID: Ellen Drake, female    DOB: June 30, 1946, 78 y.o.   MRN: [id number]  Patient here for  Chief Complaint  Patient presents with   LEG CRAMPS    HPI Here for a work in appt - work in to discuss leg cramping. Saw cardiology 12/24/23 - s/p PPM. Evaluated for incisional pain. Concern regarding superficial skin injection. Treated with keflex . Interrogation of dual chamber PPM - device programming remains appropriate. Reports persistent pain around the pacemaker site and left shoulder. Has had injections in her shoulder. Injections have helped her shoulder, but she does not feel this helps the pain around her incision site. No chest pain or sob. Does report some fatigue. Diarrhea is better since changing to magnesium  glycinate. Cramps are better with current dose of magnesium . Potassium level is wnl on current dose of potassium. She does report increased swelling of her feet. Feels has worsened over the last two weeks. Her left leg has been bigger than her right - chronic issue for her. Persistent knee pain and swelling. Seeing Dr Aubry Blase. Feels her feet and ankles are more swollen. Due mammogram.    Past Medical History:  Diagnosis Date   Anemia    reslved   Colon polyp 2009   Crohn's disease (HCC)    Diverticulosis    Fibrocystic breast disease    Grade I diastolic dysfunction    Herniated nucleus pulposus of lumbosacral region    History of chicken pox    Hypercholesterolemia    Hyperlipidemia    Hypertension    Mild aortic stenosis by prior echocardiogram    Mild mitral regurgitation by prior echocardiogram    Osteoarthritis    Pacemaker    Pulmonary nodule seen on imaging study 2004   resolved 2007   Second degree AV block, Mobitz type II    Past Surgical History:  Procedure Laterality Date   anterior cervical discectomy and fusion  1998   Dr Dwan Girt SURGERY  1992   ruptured disc (Dr Glinda Lapping)   BREAST BIOPSY Left    neg-no scar seen    CATARACT EXTRACTION W/PHACO Right 02/09/2019   Procedure: CATARACT EXTRACTION PHACO AND INTRAOCULAR LENS PLACEMENT (IOC) KAHOOK DUAL BLADE GONIOTOMY  RIGHT HEALON 5, VISION BLUE;  Surgeon: Annell Kidney, MD;  Location: [facility];  Service: Ophthalmology;  Laterality: Right;   CATARACT EXTRACTION W/PHACO Left 11/11/2023   Procedure: PHACOEMULSIFICATION, CATARACT, WITH IOL INSERTION 11.75 01:03.7;  Surgeon: Annell Kidney, MD;  Location: [facility];  Service: Ophthalmology;  Laterality: Left;   COLECTOMY  2003   COLON SURGERY  1998   COLONOSCOPY  2009   COLONOSCOPY W/ BIOPSIES  11/30/2012   Procedure: COLONOSCOPY W/BIOPSY; Surgeon: Lucendia Rusk, MD; Location: [facility]; Service: Gastroenterology;;   COLONOSCOPY W/ BIOPSIES  04/15/2016   Procedure: Colonoscopy ; Surgeon: Lucendia Rusk, MD; Location: [facility]; Service: Gastroenterology; Laterality: N/A;   KNEE ARTHROSCOPY W/ SYNOVECTOMY Right 03/10/2014   limited   KNEE SURGERY Right 1/16   [facility] (Dr. Willard Harman)   PACEMAKER IMPLANT N/A 03/19/2023   Procedure: PACEMAKER IMPLANT;  Surgeon: Lei Pump, MD;  Location: [facility];  Service: Cardiovascular;  Laterality: N/A;   POSTERIOR LAMINECTOMY / DECOMPRESSION LUMBAR SPINE  1999   REPLACEMENT TOTAL KNEE     ROTATOR CUFF REPAIR Left 2007   acute open; accident   Family History  Problem Relation Age of Onset  CVA Mother    Hypertension Mother    Hypertension Sister        brain tumor   Breast cancer Neg Hx    Colon cancer Neg Hx    Social History   Socioeconomic History   Marital status: Married    Spouse name: Not on file   Number of children: 0   Years of education: 12   Highest education level: Not on file  Occupational History   Not on file  Tobacco Use   Smoking status: Former    Current packs/day: 0.00    Types: Cigarettes    Quit date: 09/10/2009    Years since quitting: 14.3   Smokeless  tobacco: Never  Vaping Use   Vaping status: Never Used  Substance and Sexual Activity   Alcohol use: Yes    Alcohol/week: 1.0 standard drink of alcohol    Types: 1 Glasses of wine per week    Comment: wine occasional; socially   Drug use: No   Sexual activity: Yes  Other Topics Concern   Not on file  Social History Narrative   married   Social Drivers of Corporate investment banker Strain: Low Risk  (10/15/2023)   Received from [facility]   Overall Financial Resource Strain (CARDIA)    Difficulty of Paying Living Expenses: Not hard at all  Food Insecurity: No Food Insecurity (10/15/2023)   Received from [facility]   Hunger Vital Sign    Worried About Running Out of Food in the Last Year: Never true    Ran Out of Food in the Last Year: Never true  Transportation Needs: No Transportation Needs (10/15/2023)   Received from [facility] - Transportation    In the past 12 months, has lack of transportation kept you from medical appointments or from getting medications?: No    Lack of Transportation (Non-Medical): No  Physical Activity: Inactive (07/24/2023)   Exercise Vital Sign    Days of Exercise per Week: 0 days    Minutes of Exercise per Session: 0 min  Stress: No Stress Concern Present (07/24/2023)   [employer] of Occupational Health - Occupational Stress Questionnaire    Feeling of Stress : Not at all  Social Connections: Socially Integrated (07/24/2023)   Social Connection and Isolation Panel [NHANES]    Frequency of Communication with Friends and Family: More than three times a week    Frequency of Social Gatherings with Friends and Family: Twice a week    Attends Religious Services: More than 4 times per year    Active Member of [employer] or Organizations: Yes    Attends Engineer, structural: More than 4 times per year    Marital Status: Married     Review of Systems  Constitutional:  Positive  for fatigue. Negative for appetite change and unexpected weight change.  HENT:  Negative for congestion and sinus pressure.   Respiratory:  Negative for cough, chest tightness and shortness of breath.   Cardiovascular:  Negative for chest pain and palpitations.       Feet and ankle swelling as outlined.   Gastrointestinal:  Negative for abdominal pain, diarrhea, nausea and vomiting.  Genitourinary:  Negative for difficulty urinating and dysuria.  Musculoskeletal:  Negative for joint swelling and myalgias.  Skin:  Negative for rash.       Appears to have venostasis changes - right lower ankle.   Neurological:  Negative for  dizziness and headaches.  Psychiatric/Behavioral:  Negative for agitation and dysphoric mood.        Objective:     BP 132/70   Pulse 70   Resp 16   Ht 5\' 6"  (1.676 m)   Wt 149 lb 9.6 oz (67.9 kg)   LMP 08/13/1995   SpO2 97%   BMI 24.15 kg/m  Wt Readings from Last 3 Encounters:  01/05/24 149 lb 9.6 oz (67.9 kg)  12/24/23 148 lb 9.6 oz (67.4 kg)  12/23/23 147 lb (66.7 kg)    Physical Exam Vitals reviewed.  Constitutional:      General: She is not in acute distress.    Appearance: Normal appearance.  HENT:     Head: Normocephalic and atraumatic.     Right Ear: External ear normal.     Left Ear: External ear normal.     Mouth/Throat:     Pharynx: No oropharyngeal exudate or posterior oropharyngeal erythema.  Eyes:     General: No scleral icterus.       Right eye: No discharge.        Left eye: No discharge.     Conjunctiva/sclera: Conjunctivae normal.  Neck:     Thyroid : No thyromegaly.  Cardiovascular:     Rate and Rhythm: Normal rate and regular rhythm.  Pulmonary:     Effort: No respiratory distress.     Breath sounds: Normal breath sounds. No wheezing.  Abdominal:     General: Bowel sounds are normal.     Palpations: Abdomen is soft.     Tenderness: There is no abdominal tenderness.  Musculoskeletal:     Cervical back: Neck supple. No  tenderness.     Comments: Increased pedal and ankle swelling - right > left. Skin changes c/w venostasis changes. Left leg larger than right - chronic.   Lymphadenopathy:     Cervical: No cervical adenopathy.  Skin:    Findings: No erythema or rash.  Neurological:     Mental Status: She is alert.  Psychiatric:        Mood and Affect: Mood normal.        Behavior: Behavior normal.         Outpatient Encounter Medications as of 01/05/2024  Medication Sig   cyanocobalamin  1000 MCG tablet Take 1,000 mcg by mouth daily.   amLODipine  (NORVASC ) 10 MG tablet Take 1 tablet (10 mg total) by mouth daily.   ASPIRIN 81 PO Take 1 tablet by mouth daily.   carvedilol  (COREG ) 12.5 MG tablet Take 1 tablet (12.5 mg total) by mouth 2 (two) times daily.   folic acid (FOLVITE) 1 MG tablet Take 1 mg by mouth daily.   hydrochlorothiazide  (MICROZIDE ) 12.5 MG capsule Take 1 capsule (12.5 mg total) by mouth daily.   latanoprost  (XALATAN ) 0.005 % ophthalmic solution Place 1 drop into both eyes at bedtime.   MAGNESIUM  GLYCINATE PO Take 200 mg by mouth 2 (two) times daily.   potassium chloride  (KLOR-CON  M10) 10 MEQ tablet Take 2 tablets (20 mEq total) by mouth 2 (two) times daily.   rosuvastatin  (CRESTOR ) 10 MG tablet Take 1 tablet (10 mg total) by mouth daily.   sulfaSALAzine  (AZULFIDINE ) 500 MG tablet Take 1,500 mg by mouth 2 (two) times daily. 3 tabs   timolol  (TIMOPTIC ) 0.5 % ophthalmic solution Place 1 drop into both eyes 2 (two) times daily.   traMADol  (ULTRAM ) 50 MG tablet Take 1 tablet (50 mg total) by mouth 2 (two) times daily. Each refill must  last 30 days.   triamcinolone  cream (KENALOG ) 0.1 % Apply 1 Application topically as needed (prn). As needed   No facility-administered encounter medications on file as of 01/05/2024.     Lab Results  Component Value Date   WBC 7.4 10/15/2023   HGB 12.2 10/15/2023   HCT 36.3 10/15/2023   PLT 179.0 10/15/2023   GLUCOSE 92 12/09/2023   CHOL 169 10/15/2023    TRIG 107.0 10/15/2023   HDL 72.30 10/15/2023   LDLDIRECT 132.5 05/06/2013   LDLCALC 76 10/15/2023   ALT 9 10/15/2023   AST 20 10/15/2023   NA 143 12/09/2023   K 3.7 12/09/2023   CL 107 12/09/2023   CREATININE 0.90 12/09/2023   BUN 19 12/09/2023   CO2 30 12/09/2023   TSH 2.22 05/13/2023   HGBA1C 5.7 10/19/2012       Assessment & Plan:  Visit for screening mammogram -     3D Screening Mammogram, Left and Right; Future  Vitamin B12 deficiency Assessment & Plan: Continue vitamin B12 supplements.    Right leg swelling Assessment & Plan: Has had chronic swelling in her right knee. Feels her feet and ankles are more swollen now. Some skin changes that appear to be c/w venostasis changes. DP pulses palpable and equal. Swelling involving both legs. Will have AVVS evaluate - venous flow/insufficiency. Will decrease amlodipine  to 1/2 10mg  q day. Follow pressures.  Call with update.   Orders: -     Ambulatory referral to Vascular Surgery  Anemia, unspecified type Assessment & Plan: Follow cbc.    Chronic knee pain (1ry area of Pain) (Right) Assessment & Plan: Seeing Dr Aubry Blase. Chronic knee pain and swelling. Continue f/u with ortho.    Crohn's disease of large intestine without complication [facility]) Assessment & Plan: Bowels stable. Doing well. Has been followed by GI ([facility]). Continue sulfasalazine . Continue f/u with GI.    Foot cramps Assessment & Plan: Cramps have improved with current dose of magnesium .    Hypercholesterolemia Assessment & Plan: Continue crestor . Follow lipid panel and liver function tests.    Primary hypertension Assessment & Plan: Continues carvedilol , hydrochlorothiazide . Per note, taking amlodipine . Need to confirm. Blood pressure doing well. Follow pressures.  Follow metabolic panel.    Left shoulder pain, unspecified chronicity Assessment & Plan: Increased pain. S/p injection previosly - helped shoulder. Pain around insertion site. F/u  with cardiology. Also plan f/u with ortho regarding her persistent shoulder issue.    Leg cramps Assessment & Plan: Better on current dose of magnesium .    Second degree AV block, Mobitz type II Assessment & Plan: S/p pacemakder placement 03/2023. Just reevaluated by cardiology. Placed on abx for concern regarding possible insertion around pacemaker. No signs of infection today on exam. Will have her f/u with cardiology given persistent pain.       Dellar Fenton, MD

## 2024-01-05 NOTE — Assessment & Plan Note (Addendum)
 Has had chronic swelling in her right knee. Feels her feet and ankles are more swollen now. Some skin changes that appear to be c/w venostasis changes. DP pulses palpable and equal. Swelling involving both legs. Will have AVVS evaluate - venous flow/insufficiency. Will decrease amlodipine  to 1/2 10mg  q day. Follow pressures.  Call with update.

## 2024-01-10 ENCOUNTER — Encounter: Payer: Self-pay | Admitting: Internal Medicine

## 2024-01-10 NOTE — Assessment & Plan Note (Signed)
 S/p pacemakder placement 03/2023. Just reevaluated by cardiology. Placed on abx for concern regarding possible insertion around pacemaker. No signs of infection today on exam. Will have her f/u with cardiology given persistent pain.

## 2024-01-10 NOTE — Assessment & Plan Note (Signed)
 Cramps have improved with current dose of magnesium .

## 2024-01-10 NOTE — Assessment & Plan Note (Signed)
 Follow cbc.

## 2024-01-10 NOTE — Assessment & Plan Note (Signed)
 Continues carvedilol , hydrochlorothiazide . Per note, taking amlodipine . Need to confirm. Blood pressure doing well. Follow pressures.  Follow metabolic panel.

## 2024-01-10 NOTE — Assessment & Plan Note (Signed)
 Better on current dose of magnesium .

## 2024-01-10 NOTE — Assessment & Plan Note (Signed)
 Continue crestor. Follow lipid panel and liver function tests.

## 2024-01-10 NOTE — Assessment & Plan Note (Signed)
 Bowels stable. Doing well. Has been followed by GI ([facility]). Continue sulfasalazine . Continue f/u with GI.

## 2024-01-10 NOTE — Assessment & Plan Note (Signed)
 Increased pain. S/p injection previosly - helped shoulder. Pain around insertion site. F/u with cardiology. Also plan f/u with ortho regarding her persistent shoulder issue.

## 2024-01-10 NOTE — Assessment & Plan Note (Signed)
 Seeing Dr Aubry Blase. Chronic knee pain and swelling. Continue f/u with ortho.

## 2024-01-18 ENCOUNTER — Ambulatory Visit (INDEPENDENT_AMBULATORY_CARE_PROVIDER_SITE_OTHER): Admitting: Vascular Surgery

## 2024-01-18 VITALS — BP 123/76 | HR 66 | Ht 66.0 in | Wt 147.0 lb

## 2024-01-18 DIAGNOSIS — I10 Essential (primary) hypertension: Secondary | ICD-10-CM | POA: Diagnosis not present

## 2024-01-18 DIAGNOSIS — M1990 Unspecified osteoarthritis, unspecified site: Secondary | ICD-10-CM

## 2024-01-18 DIAGNOSIS — I890 Lymphedema, not elsewhere classified: Secondary | ICD-10-CM

## 2024-01-19 ENCOUNTER — Encounter (INDEPENDENT_AMBULATORY_CARE_PROVIDER_SITE_OTHER): Payer: Self-pay | Admitting: Vascular Surgery

## 2024-01-19 NOTE — Progress Notes (Signed)
 Subjective:    Patient ID: Ellen Drake, female    DOB: Nov 19, 1945, 78 y.o.   MRN: [id number] Chief Complaint  Patient presents with   New Patient (Initial Visit)    Right leg swelling    Ellen Drake is a 78 year old female who presents to clinic today with chief complaint of bilateral lower extremity swelling right greater than left.  Ellen Drake endorses that she has had surgery to her right knee replacement 3 times due to difficulties.  She states she is always had some swelling to her right knee and right leg but now the swelling is in both of her lower extremities.  She states that it is uncomfortable and it periods of time it is more painful in the right leg than the left.  She has not tried any conservative therapy as of yet.  She endorses she has not had any ultrasounds to her lower extremities to check for DVTs or reflux disease.    Review of Systems  Constitutional: Negative.   Musculoskeletal:  Positive for gait problem and joint swelling.       Joint swelling gait difficulties are due to right knee replacement 15 years ago that has been operated on 3 times now.  All other systems reviewed and are negative.      Objective:    Physical Exam Vitals reviewed.  Constitutional:      Appearance: Normal appearance. She is normal weight.  HENT:     Head: Normocephalic.  Eyes:     Pupils: Pupils are equal, round, and reactive to light.  Cardiovascular:     Rate and Rhythm: Normal rate and regular rhythm.     Pulses: Normal pulses.     Heart sounds: Normal heart sounds.  Pulmonary:     Effort: Pulmonary effort is normal.     Breath sounds: Normal breath sounds.  Abdominal:     General: Abdomen is flat. Bowel sounds are normal.     Palpations: Abdomen is soft.  Musculoskeletal:        General: Swelling present.     Right lower leg: Edema present.     Left lower leg: Edema present.     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  Patient has developed bilateral lower extremity lymphedema  Skin:    General: Skin is warm and dry.     Capillary Refill: Capillary refill takes 2 to 3 seconds.  Neurological:     General: No focal deficit present.     Mental Status: She is alert and oriented to person, place, and time. Mental status is at baseline.  Psychiatric:        Mood and Affect: Mood normal.        Behavior: Behavior normal.        Thought Content: Thought content normal.        Judgment: Judgment normal.     BP 123/76   Pulse 66   Ht 5\' 6"  (1.676 m)   Wt 147 lb (66.7 kg)   LMP 08/13/1995   BMI 23.73 kg/m   Past Medical History:  Diagnosis Date   Anemia    reslved   Colon polyp 2009   Crohn's disease (HCC)    Diverticulosis    Fibrocystic breast disease    Grade I diastolic dysfunction    Herniated nucleus pulposus of lumbosacral region    History of chicken pox    Hypercholesterolemia  Hyperlipidemia    Hypertension    Mild aortic stenosis by prior echocardiogram    Mild mitral regurgitation by prior echocardiogram    Osteoarthritis    Pacemaker    Pulmonary nodule seen on imaging study 2004   resolved 2007   Second degree AV block, Mobitz type II     Social History   Socioeconomic History   Marital status: Married    Spouse name: Not on file   Number of children: 0   Years of education: 12   Highest education level: Not on file  Occupational History   Not on file  Tobacco Use   Smoking status: Former    Current packs/day: 0.00    Types: Cigarettes    Quit date: 09/10/2009    Years since quitting: 14.3   Smokeless tobacco: Never  Vaping Use   Vaping status: Never Used  Substance and Sexual Activity   Alcohol use: Yes    Alcohol/week: 1.0 standard drink of alcohol    Types: 1 Glasses of wine per week    Comment: wine occasional; socially   Drug use: No   Sexual activity: Yes  Other Topics Concern   Not on file  Social History Narrative    married   Social Drivers of Corporate investment banker Strain: Low Risk  (10/15/2023)   Received from [facility]   Overall Financial Resource Strain (CARDIA)    Difficulty of Paying Living Expenses: Not hard at all  Food Insecurity: No Food Insecurity (10/15/2023)   Received from [facility]   Hunger Vital Sign    Worried About Running Out of Food in the Last Year: Never true    Ran Out of Food in the Last Year: Never true  Transportation Needs: No Transportation Needs (10/15/2023)   Received from [facility] - Transportation    In the past 12 months, has lack of transportation kept you from medical appointments or from getting medications?: No    Lack of Transportation (Non-Medical): No  Physical Activity: Inactive (07/24/2023)   Exercise Vital Sign    Days of Exercise per Week: 0 days    Minutes of Exercise per Session: 0 min  Stress: No Stress Concern Present (07/24/2023)   [employer] of Occupational Health - Occupational Stress Questionnaire    Feeling of Stress : Not at all  Social Connections: Socially Integrated (07/24/2023)   Social Connection and Isolation Panel [NHANES]    Frequency of Communication with Friends and Family: More than three times a week    Frequency of Social Gatherings with Friends and Family: Twice a week    Attends Religious Services: More than 4 times per year    Active Member of [employer] or Organizations: Yes    Attends Banker Meetings: More than 4 times per year    Marital Status: Married  Catering manager Violence: Not At Risk (07/24/2023)   Humiliation, Afraid, Rape, and Kick questionnaire    Fear of Current or Ex-Partner: No    Emotionally Abused: No    Physically Abused: No    Sexually Abused: No    Past Surgical History:  Procedure Laterality Date   anterior cervical discectomy and fusion  1998   Dr Dwan Girt SURGERY  1992   ruptured disc (Dr Glinda Lapping)    BREAST BIOPSY Left    neg-no scar seen   CATARACT EXTRACTION W/PHACO Right 02/09/2019   Procedure: CATARACT EXTRACTION  PHACO AND INTRAOCULAR LENS PLACEMENT (IOC) KAHOOK DUAL BLADE GONIOTOMY  RIGHT HEALON 5, VISION BLUE;  Surgeon: Annell Kidney, MD;  Location: [facility];  Service: Ophthalmology;  Laterality: Right;   CATARACT EXTRACTION W/PHACO Left 11/11/2023   Procedure: PHACOEMULSIFICATION, CATARACT, WITH IOL INSERTION 11.75 01:03.7;  Surgeon: Annell Kidney, MD;  Location: [facility];  Service: Ophthalmology;  Laterality: Left;   COLECTOMY  2003   COLON SURGERY  1998   COLONOSCOPY  2009   COLONOSCOPY W/ BIOPSIES  11/30/2012   Procedure: COLONOSCOPY W/BIOPSY; Surgeon: Lucendia Rusk, MD; Location: [facility]; Service: Gastroenterology;;   COLONOSCOPY W/ BIOPSIES  04/15/2016   Procedure: Colonoscopy ; Surgeon: Lucendia Rusk, MD; Location: [facility]; Service: Gastroenterology; Laterality: N/A;   KNEE ARTHROSCOPY W/ SYNOVECTOMY Right 03/10/2014   limited   KNEE SURGERY Right 1/16   [facility] (Dr. Willard Harman)   PACEMAKER IMPLANT N/A 03/19/2023   Procedure: PACEMAKER IMPLANT;  Surgeon: Lei Pump, MD;  Location: [facility];  Service: Cardiovascular;  Laterality: N/A;   POSTERIOR LAMINECTOMY / DECOMPRESSION LUMBAR SPINE  1999   REPLACEMENT TOTAL KNEE     ROTATOR CUFF REPAIR Left 2007   acute open; accident    Family History  Problem Relation Age of Onset   CVA Mother    Hypertension Mother    Hypertension Sister        brain tumor   Breast cancer Neg Hx    Colon cancer Neg Hx     Allergies  Allergen Reactions   Mercaptopurine Nausea Only and Other (See Comments)   Lyrica [Pregabalin]     Hand swelling   Dilaudid  [Hydromorphone  Hcl] Rash       Latest Ref Rng & Units 10/15/2023    1:45 PM 09/29/2023    1:16 PM 07/13/2023    3:05 PM  CBC  WBC 4.0 - 10.5 K/uL 7.4  6.6  7.0   Hemoglobin 12.0 - 15.0 g/dL  16.1  09.6  04.5   Hematocrit 36.0 - 46.0 % 36.3  34.7  35.9   Platelets 150.0 - 400.0 K/uL 179.0  178.0  180.0        CMP     Component Value Date/Time   NA 143 12/09/2023 1154   K 3.7 12/09/2023 1154   CL 107 12/09/2023 1154   CO2 30 12/09/2023 1154   GLUCOSE 92 12/09/2023 1154   BUN 19 12/09/2023 1154   CREATININE 0.90 12/09/2023 1154   CALCIUM  9.7 12/09/2023 1154   PROT 7.1 10/15/2023 1345   ALBUMIN 4.2 10/15/2023 1345   AST 20 10/15/2023 1345   ALT 9 10/15/2023 1345   ALKPHOS 57 10/15/2023 1345   BILITOT 0.3 10/15/2023 1345   GFR 61.43 12/09/2023 1154   GFRNONAA 58 (L) 03/20/2023 0832     No results found.     Assessment & Plan:   1. Lymphedema (Primary) Recommend:  I have had a long discussion with the patient regarding swelling and why it  causes symptoms.  Patient will begin wearing graduated compression on a daily basis a prescription was given. The patient will  wear the stockings first thing in the morning and removing them in the evening. The patient is instructed specifically not to sleep in the stockings.   In addition, behavioral modification will be initiated.  This will include frequent elevation, use of over the counter pain medications and exercise such as walking.  Consideration for a lymph pump will also  be made based upon the effectiveness of conservative therapy.  This would help to improve the edema control and prevent sequela such as ulcers and infections   Patient should undergo duplex ultrasound of the venous system to ensure that DVT or reflux is not present.  The patient will follow-up with me after the ultrasound.   2. Primary hypertension Continue antihypertensive medications as already ordered, these medications have been reviewed and there are no changes at this time.  3. Arthritis Continue arthritis medications as already ordered, these medications have been reviewed and there are no changes at this time.   Current Outpatient  Medications on File Prior to Visit  Medication Sig Dispense Refill   amLODipine  (NORVASC ) 10 MG tablet Take 1 tablet (10 mg total) by mouth daily. 90 tablet 3   ASPIRIN 81 PO Take 1 tablet by mouth daily.     carvedilol  (COREG ) 12.5 MG tablet Take 1 tablet (12.5 mg total) by mouth 2 (two) times daily. 90 tablet 3   cyanocobalamin  1000 MCG tablet Take 1,000 mcg by mouth daily.     folic acid (FOLVITE) 1 MG tablet Take 1 mg by mouth daily.     hydrochlorothiazide  (MICROZIDE ) 12.5 MG capsule Take 1 capsule (12.5 mg total) by mouth daily. 90 capsule 1   latanoprost  (XALATAN ) 0.005 % ophthalmic solution Place 1 drop into both eyes at bedtime. 7.5 mL 4   MAGNESIUM  GLYCINATE PO Take 200 mg by mouth 2 (two) times daily.     potassium chloride  (KLOR-CON  M10) 10 MEQ tablet Take 2 tablets (20 mEq total) by mouth 2 (two) times daily. 360 tablet 1   rosuvastatin  (CRESTOR ) 10 MG tablet Take 1 tablet (10 mg total) by mouth daily. 90 tablet 3   sulfaSALAzine  (AZULFIDINE ) 500 MG tablet Take 1,500 mg by mouth 2 (two) times daily. 3 tabs     timolol  (TIMOPTIC ) 0.5 % ophthalmic solution Place 1 drop into both eyes 2 (two) times daily.  3   traMADol  (ULTRAM ) 50 MG tablet Take 1 tablet (50 mg total) by mouth 2 (two) times daily. Each refill must last 30 days. 60 tablet 5   triamcinolone  cream (KENALOG ) 0.1 % Apply 1 Application topically as needed (prn). As needed     No current facility-administered medications on file prior to visit.    There are no Patient Instructions on file for this visit. No follow-ups on file.   Annamaria Barrette, NP

## 2024-01-20 ENCOUNTER — Ambulatory Visit
Admission: RE | Admit: 2024-01-20 | Discharge: 2024-01-20 | Disposition: A | Discharge status: Home / Self Care | Source: Ambulatory Visit | Attending: Internal Medicine | Admitting: Internal Medicine

## 2024-01-20 DIAGNOSIS — Z1231 Encounter for screening mammogram for malignant neoplasm of breast: Secondary | ICD-10-CM | POA: Insufficient documentation

## 2024-01-22 NOTE — Addendum Note (Signed)
 Addended by: Lott Rouleau A on: 01/22/2024 11:08 AM   Modules accepted: Orders

## 2024-01-22 NOTE — Progress Notes (Signed)
 Remote pacemaker transmission.

## 2024-01-26 DIAGNOSIS — I890 Lymphedema, not elsewhere classified: Secondary | ICD-10-CM | POA: Diagnosis not present

## 2024-01-27 ENCOUNTER — Other Ambulatory Visit: Payer: Self-pay | Admitting: Internal Medicine

## 2024-01-28 DIAGNOSIS — Z950 Presence of cardiac pacemaker: Secondary | ICD-10-CM | POA: Insufficient documentation

## 2024-01-28 NOTE — Progress Notes (Signed)
 Electrophysiology Clinic Note    Date:  01/29/2024  Patient ID:  Ellen Drake, Ellen Drake [birth year], MRN [id number] PCP:  Dellar Fenton, MD  Cardiologist:  Sheryle Donning, MD Electrophysiologist: Lei Pump, MD   Discussed the use of AI scribe software for clinical note transcription with the patient, who gave verbal consent to proceed.   Patient Profile    Chief Complaint: PPM follow-up  History of Present Illness: Ellen Drake is a 78 y.o. 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   She was first eval'd by EP 03/2023 where she presented to ER from PCP office after found to be in 2:1 HB on atenolol . HB continued after atenolol  held. PPM implanted 03/2023. She saw PA Tillery 06/2023 for routine follow-up, at that appt she c/o off and on tenderness to PPM site.  I saw her for acute visit 12/2023 for acute visit due to increased pain at her PPM site.  Site did not appear overly infected, but was exquisitely tender to palpation.  She was started on p.o. antibiotics x 1 week.  On follow-up today, she continues to have left shoulder pain and is very difficult to differentiate whether it is her left shoulder or her pacemaker generator site.  She completed the antibiotics and does think that the pain is overall better since finishing antibiotics. She has also noticed slight improvement in fatigue.  She was able to follow-up with primary care who adjusted her blood pressure medications.  She denies chest pain, chest pressure, palpitations, shortness of breath, presyncope.      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 All other systems are reviewed and otherwise negative.    Physical Exam    VS:  BP 118/73 (BP Location: Left Arm, Patient  Position: Sitting, Cuff Size: Normal)   Pulse 72   Ht 5' 6 (1.676 m)   Wt 147 lb (66.7 kg)   LMP 08/13/1995   SpO2 94%   BMI 23.73 kg/m  BMI: Body mass index is 23.73 kg/m.  Wt Readings from Last 3 Encounters:  01/29/24 147 lb (66.7 kg)  01/18/24 147 lb (66.7 kg)  01/05/24 149 lb 9.6 oz (67.9 kg)     GEN- The patient is well appearing, alert and oriented x 3 today.   Lungs- Clear to ausculation bilaterally, normal work of breathing.  Heart- Regular rate and rhythm, no murmurs, rubs or gallops Extremities- No peripheral edema, warm, dry Skin-  device pocket no tethering, no edema or fluctuance, no erythema.  Nontender to palpitation and manipulation      Device interrogation done today and reviewed by myself:  Battery 10.6 years Lead thresholds, impedence, sensing stable  Dependent down to VVI 40 today Histograms appropriate No recent episodes No changes made today   Studies Reviewed   Previous EP, cardiology notes.    EKG is not ordered. Personal review of EKG from 12/09/2023 shows:  SR at 71bpm       TTE, 03/17/2023  1. Intracavitary gradient. Peak gradient 1.7 m/s. Peak gradient 5.5 mmHg. Left ventricular ejection fraction, by estimation, is 60 to 65%. The left ventricle has normal function. The left ventricle has no regional wall motion abnormalities. Left ventricular diastolic parameters are consistent with  Grade I diastolic dysfunction (impaired relaxation).   2. Right ventricular systolic function is normal. The right ventricular size is normal.   3. Left atrial size was mildly dilated.   4. The mitral valve is normal in structure. Mild mitral valve regurgitation. No evidence of mitral stenosis.   5. Aortic outflow gradients are mildly elevated. The aortic valve appears normal. Cannot exclude a subvalvular or supravalvular membrane. The aortic valve is tricuspid. Aortic valve regurgitation is not visualized. Mild aortic valve stenosis. Aortic valve area, by VTI  measures 2.03 cm. Aortic valve mean gradient measures 10.0 mmHg. Aortic valve Vmax measures 2.23 m/s.   6. The inferior vena cava is normal in size with greater than 50% respiratory variability, suggesting right atrial pressure of 3 mmHg.      Assessment and Plan     #) SND s/p PPM #) incisional pain #) superficial skin infection Pacemaker functioning well, see pace art for details Patient no longer having tenderness to palpation of PPM pocket Dependent today Recommended she continue to follow-up with ortho/PCP for L shoulder eval and treatment.   #) fatigue Improved compared to last visit after PCP adjusted BP medications PPM has appropriate HR histograms      Current medicines are reviewed at length with the patient today.   The patient does not have concerns regarding her medicines.  The following changes were made today:   none  Labs/ tests ordered today include:  No orders of the defined types were placed in this encounter.    Disposition: Follow up with Dr. Lawana Pray or EP APP in 12 months, sooner if needed. Continue remote monitoring   Signed, Aneesha Holloran, NP  01/29/24  11:45 AM  Electrophysiology [facility]

## 2024-01-29 ENCOUNTER — Ambulatory Visit: Payer: Self-pay | Admitting: Cardiology

## 2024-01-29 ENCOUNTER — Ambulatory Visit: Attending: Cardiology | Admitting: Cardiology

## 2024-01-29 VITALS — BP 118/73 | HR 72 | Ht 66.0 in | Wt 147.0 lb

## 2024-01-29 DIAGNOSIS — Z950 Presence of cardiac pacemaker: Secondary | ICD-10-CM

## 2024-01-29 DIAGNOSIS — I441 Atrioventricular block, second degree: Secondary | ICD-10-CM

## 2024-01-29 DIAGNOSIS — T8141XA Infection following a procedure, superficial incisional surgical site, initial encounter: Secondary | ICD-10-CM

## 2024-01-29 LAB — CUP PACEART INCLINIC DEVICE CHECK
Date Time Interrogation Session: 20250620115057
Implantable Lead Connection Status: 753985
Implantable Lead Connection Status: 753985
Implantable Lead Implant Date: 20240808
Implantable Lead Implant Date: 20240808
Implantable Lead Location: 753859
Implantable Lead Location: 753860
Implantable Lead Model: 3830
Implantable Lead Model: 5076
Implantable Pulse Generator Implant Date: 20240808

## 2024-01-29 NOTE — Patient Instructions (Signed)
 Medication Instructions:  The current medical regimen is effective;  continue present plan and medications as directed. Please refer to the Current Medication list given to you today.   *If you need a refill on your cardiac medications before your next appointment, please call your pharmacy*  Follow-Up: At [facility], you and your health needs are our priority.  As part of our continuing mission to provide you with exceptional heart care, our providers are all part of one team.  This team includes your primary Cardiologist (physician) and Advanced Practice Providers or APPs (Physician Assistants and Nurse Practitioners) who all work together to provide you with the care you need, when you need it.  Your next appointment:   12 month(s)  Provider:   Agatha Horsfall, MD or Suzann Riddle, NP    We recommend signing up for the patient portal called MyChart.  Sign up information is provided on this After Visit Summary.  MyChart is used to connect with patients for Virtual Visits (Telemedicine).  Patients are able to view lab/test results, encounter notes, upcoming appointments, etc.  Non-urgent messages can be sent to your provider as well.   To learn more about what you can do with MyChart, go to [email or web address].

## 2024-02-01 ENCOUNTER — Encounter: Admitting: Cardiology

## 2024-02-15 ENCOUNTER — Ambulatory Visit (INDEPENDENT_AMBULATORY_CARE_PROVIDER_SITE_OTHER): Admitting: Internal Medicine

## 2024-02-15 ENCOUNTER — Encounter: Payer: Self-pay | Admitting: Internal Medicine

## 2024-02-15 VITALS — BP 132/78 | HR 75 | Ht 66.0 in | Wt 147.4 lb

## 2024-02-15 DIAGNOSIS — M25561 Pain in right knee: Secondary | ICD-10-CM | POA: Diagnosis not present

## 2024-02-15 DIAGNOSIS — I10 Essential (primary) hypertension: Secondary | ICD-10-CM

## 2024-02-15 DIAGNOSIS — E7800 Pure hypercholesterolemia, unspecified: Secondary | ICD-10-CM

## 2024-02-15 DIAGNOSIS — R252 Cramp and spasm: Secondary | ICD-10-CM

## 2024-02-15 DIAGNOSIS — Z0000 Encounter for general adult medical examination without abnormal findings: Secondary | ICD-10-CM

## 2024-02-15 DIAGNOSIS — M25512 Pain in left shoulder: Secondary | ICD-10-CM

## 2024-02-15 DIAGNOSIS — K5010 Crohn's disease of large intestine without complications: Secondary | ICD-10-CM | POA: Diagnosis not present

## 2024-02-15 DIAGNOSIS — G8929 Other chronic pain: Secondary | ICD-10-CM

## 2024-02-15 LAB — LIPID PANEL
Cholesterol: 237 mg/dL — ABNORMAL HIGH (ref 0–200)
HDL: 63.4 mg/dL (ref >39.00)
LDL Cholesterol: 147 mg/dL — ABNORMAL HIGH (ref 0–99)
NonHDL: 173.32
Total CHOL/HDL Ratio: 4
Triglycerides: 133 mg/dL (ref 0.0–149.0)
VLDL: 26.6 mg/dL (ref 0.0–40.0)

## 2024-02-15 LAB — MAGNESIUM: Magnesium: 1.7 mg/dL (ref 1.5–2.5)

## 2024-02-15 LAB — HEPATIC FUNCTION PANEL
ALT: 7 U/L (ref 0–35)
AST: 19 U/L (ref 0–37)
Albumin: 4.2 g/dL (ref 3.5–5.2)
Alkaline Phosphatase: 57 U/L (ref 39–117)
Bilirubin, Direct: 0.1 mg/dL (ref 0.0–0.3)
Total Bilirubin: 0.4 mg/dL (ref 0.2–1.2)
Total Protein: 6.7 g/dL (ref 6.0–8.3)

## 2024-02-15 LAB — TSH: TSH: 2.55 u[IU]/mL (ref 0.35–5.50)

## 2024-02-15 LAB — BASIC METABOLIC PANEL WITH GFR
BUN: 17 mg/dL (ref 6–23)
CO2: 32 meq/L (ref 19–32)
Calcium: 9.9 mg/dL (ref 8.4–10.5)
Chloride: 106 meq/L (ref 96–112)
Creatinine, Ser: 0.96 mg/dL (ref 0.40–1.20)
GFR: 56.78 mL/min — ABNORMAL LOW (ref >60.00)
Glucose, Bld: 104 mg/dL — ABNORMAL HIGH (ref 70–99)
Potassium: 3.9 meq/L (ref 3.5–5.1)
Sodium: 144 meq/L (ref 135–145)

## 2024-02-15 NOTE — Assessment & Plan Note (Addendum)
 Physical today 02/15/24.  Colonoscopy 04/2019 - tubular adenoma.  Follow up colonoscopy in 3 years.  Has f/u scheduled 07/2024. Was told no further colonoscopies warranted. Mammogram 01/20/24 - Birads I.

## 2024-02-15 NOTE — Progress Notes (Signed)
 Subjective:    Patient ID: Ellen Drake, female    DOB: 01-15-1946, 78 y.o.   MRN: [id number]  Patient here for  Chief Complaint  Patient presents with   Annual Exam    Physical     HPI Here for a physical exam. Referred to [facility] last visit - leg swelling. Evalauted 01/18/24 - recommended compression hose. Reports that her muscle cramps are better on her current medication regimen. Her bowels are doing better as well. No increased diarrhea. Has f/u planned with GI 07/2024. Some persistent left shoulder pain. No pain over pacemaker site. Due to get injection tomorrow. Reports her blood pressures are averaging 114-132/70. Still with knee issues. Planning f/u with Dr Mardee 04/2024. Breathing stable. No chest pain reported.    Past Medical History:  Diagnosis Date   Anemia    reslved   Colon polyp 2009   Crohn's disease (HCC)    Diverticulosis    Fibrocystic breast disease    Grade I diastolic dysfunction    Herniated nucleus pulposus of lumbosacral region    History of chicken pox    Hypercholesterolemia    Hyperlipidemia    Hypertension    Mild aortic stenosis by prior echocardiogram    Mild mitral regurgitation by prior echocardiogram    Osteoarthritis    Pacemaker    Pulmonary nodule seen on imaging study 2004   resolved 2007   Second degree AV block, Mobitz type II    Past Surgical History:  Procedure Laterality Date   anterior cervical discectomy and fusion  1998   Dr Drury SANES SURGERY  1992   ruptured disc (Dr Kathi)   BREAST BIOPSY Left    neg-no scar seen   CATARACT EXTRACTION W/PHACO Right 02/09/2019   Procedure: CATARACT EXTRACTION PHACO AND INTRAOCULAR LENS PLACEMENT (IOC) KAHOOK DUAL BLADE GONIOTOMY  RIGHT HEALON 5, VISION BLUE;  Surgeon: Mittie Gaskin, MD;  Location: [facility];  Service: Ophthalmology;  Laterality: Right;   CATARACT EXTRACTION W/PHACO Left 11/11/2023   Procedure: PHACOEMULSIFICATION, CATARACT, WITH IOL INSERTION 11.75  01:03.7;  Surgeon: Mittie Gaskin, MD;  Location: [facility];  Service: Ophthalmology;  Laterality: Left;   COLECTOMY  2003   COLON SURGERY  1998   COLONOSCOPY  2009   COLONOSCOPY W/ BIOPSIES  11/30/2012   Procedure: COLONOSCOPY W/BIOPSY; Surgeon: Elijah DELENA SHAUNNA Tanda, MD; Location: [facility]; Service: Gastroenterology;;   COLONOSCOPY W/ BIOPSIES  04/15/2016   Procedure: Colonoscopy ; Surgeon: Elijah DELENA SHAUNNA Tanda, MD; Location: [facility]; Service: Gastroenterology; Laterality: N/A;   KNEE ARTHROSCOPY W/ SYNOVECTOMY Right 03/10/2014   limited   KNEE SURGERY Right 1/16   [facility] (Dr. Durene)   PACEMAKER IMPLANT N/A 03/19/2023   Procedure: PACEMAKER IMPLANT;  Surgeon: Inocencio Soyla Lunger, MD;  [facility];  Service: Cardiovascular;  Laterality: N/A;   POSTERIOR LAMINECTOMY / DECOMPRESSION LUMBAR SPINE  1999   REPLACEMENT TOTAL KNEE     ROTATOR CUFF REPAIR Left 2007   acute open; accident   Family History  Problem Relation Age of Onset   CVA Mother    Hypertension Mother    Hypertension Sister        brain tumor   Breast cancer Neg Hx    Colon cancer Neg Hx    Social History   Socioeconomic History   Marital status: Married    Spouse name: Not on file   Number of children: 0   Years of  education: 12   Highest education level: Not on file  Occupational History   Not on file  Tobacco Use   Smoking status: Former    Current packs/day: 0.00    Types: Cigarettes    Quit date: 09/10/2009    Years since quitting: 14.4   Smokeless tobacco: Never  Vaping Use   Vaping status: Never Used  Substance and Sexual Activity   Alcohol use: Yes    Alcohol/week: 1.0 standard drink of alcohol    Types: 1 Glasses of wine per week    Comment: wine occasional; socially   Drug use: No   Sexual activity: Yes  Other Topics Concern   Not on file  Social History Narrative   married   Social Drivers of Corporate investment banker  Strain: Low Risk  (10/15/2023)   Received from [facility]   Overall Financial Resource Strain (CARDIA)    Difficulty of Paying Living Expenses: Not hard at all  Food Insecurity: No Food Insecurity (10/15/2023)   Received from [facility]   Hunger Vital Sign    Within the past 12 months, you worried that your food would run out before you got the money to buy more.: Never true    Within the past 12 months, the food you bought just didn't last and you didn't have money to get more.: Never true  Transportation Needs: No Transportation Needs (10/15/2023)   Received from [facility] - Transportation    In the past 12 months, has lack of transportation kept you from medical appointments or from getting medications?: No    Lack of Transportation (Non-Medical): No  Physical Activity: Inactive (07/24/2023)   Exercise Vital Sign    Days of Exercise per Week: 0 days    Minutes of Exercise per Session: 0 min  Stress: No Stress Concern Present (07/24/2023)   [employer] of Occupational Health - Occupational Stress Questionnaire    Feeling of Stress : Not at all  Social Connections: Socially Integrated (07/24/2023)   Social Connection and Isolation Panel    Frequency of Communication with Friends and Family: More than three times a week    Frequency of Social Gatherings with Friends and Family: Twice a week    Attends Religious Services: More than 4 times per year    Active Member of [employer] or Organizations: Yes    Attends Engineer, structural: More than 4 times per year    Marital Status: Married     Review of Systems  Constitutional:  Negative for appetite change and unexpected weight change.  HENT:  Negative for congestion, sinus pressure and sore throat.   Eyes:  Negative for pain and visual disturbance.  Respiratory:  Negative for cough, chest tightness and shortness of breath.   Cardiovascular:  Negative for chest  pain and palpitations.  Gastrointestinal:  Negative for abdominal pain, constipation and diarrhea.  Genitourinary:  Negative for difficulty urinating and dysuria.  Musculoskeletal:  Negative for back pain and joint swelling.  Skin:  Negative for color change and rash.  Neurological:  Negative for dizziness and headaches.  Hematological:  Negative for adenopathy. Does not bruise/bleed easily.  Psychiatric/Behavioral:  Negative for decreased concentration and dysphoric mood.        Objective:     BP 132/78   Pulse 75   Ht 5' 6 (1.676 m)   Wt 147 lb 6.4 oz (66.9 kg)   LMP 08/13/1995  SpO2 94%   BMI 23.79 kg/m  Wt Readings from Last 3 Encounters:  02/18/24 146 lb 6 oz (66.4 kg)  02/15/24 147 lb 6.4 oz (66.9 kg)  01/29/24 147 lb (66.7 kg)    Physical Exam Vitals reviewed.  Constitutional:      General: She is not in acute distress.    Appearance: Normal appearance. She is well-developed.  HENT:     Head: Normocephalic and atraumatic.     Right Ear: External ear normal.     Left Ear: External ear normal.     Mouth/Throat:     Pharynx: No oropharyngeal exudate or posterior oropharyngeal erythema.  Eyes:     General: No scleral icterus.       Right eye: No discharge.        Left eye: No discharge.     Conjunctiva/sclera: Conjunctivae normal.  Neck:     Thyroid : No thyromegaly.  Cardiovascular:     Rate and Rhythm: Normal rate and regular rhythm.  Pulmonary:     Effort: No tachypnea, accessory muscle usage or respiratory distress.     Breath sounds: Normal breath sounds. No decreased breath sounds, wheezing or rhonchi.  Chest:  Breasts:    Right: No inverted nipple, mass, nipple discharge or tenderness (no axillary adenopathy).     Left: No inverted nipple, mass, nipple discharge or tenderness (no axilarry adenopathy).  Abdominal:     General: Bowel sounds are normal.     Palpations: Abdomen is soft.     Tenderness: There is no abdominal tenderness.   Musculoskeletal:        General: No swelling or tenderness.     Cervical back: Neck supple.  Lymphadenopathy:     Cervical: No cervical adenopathy.  Skin:    Findings: No erythema or rash.  Neurological:     Mental Status: She is alert and oriented to person, place, and time.  Psychiatric:        Mood and Affect: Mood normal.        Behavior: Behavior normal.         Outpatient Encounter Medications as of 02/15/2024  Medication Sig   amLODipine  (NORVASC ) 10 MG tablet Take 1 tablet (10 mg total) by mouth daily.   ASPIRIN 81 PO Take 1 tablet by mouth daily.   carvedilol  (COREG ) 12.5 MG tablet Take 1 tablet (12.5 mg total) by mouth 2 (two) times daily.   cyanocobalamin  1000 MCG tablet Take 1,000 mcg by mouth daily.   folic acid (FOLVITE) 1 MG tablet Take 1 mg by mouth daily.   hydrochlorothiazide  (MICROZIDE ) 12.5 MG capsule TAKE 1 CAPSULE BY MOUTH EVERY DAY   latanoprost  (XALATAN ) 0.005 % ophthalmic solution Place 1 drop into both eyes at bedtime.   MAGNESIUM  GLYCINATE PO Take 200 mg by mouth 2 (two) times daily.   potassium chloride  (KLOR-CON  M10) 10 MEQ tablet Take 2 tablets (20 mEq total) by mouth 2 (two) times daily.   sulfaSALAzine  (AZULFIDINE ) 500 MG tablet Take 1,500 mg by mouth 2 (two) times daily. 3 tabs   timolol  (TIMOPTIC ) 0.5 % ophthalmic solution Place 1 drop into both eyes 2 (two) times daily.   traMADol  (ULTRAM ) 50 MG tablet Take 1 tablet (50 mg total) by mouth 2 (two) times daily. Each refill must last 30 days.   triamcinolone  cream (KENALOG ) 0.1 % Apply 1 Application topically as needed (prn). As needed   [DISCONTINUED] rosuvastatin  (CRESTOR ) 10 MG tablet Take 1 tablet (10 mg total) by mouth daily.  No facility-administered encounter medications on file as of 02/15/2024.     Lab Results  Component Value Date   WBC 7.4 10/15/2023   HGB 12.2 10/15/2023   HCT 36.3 10/15/2023   PLT 179.0 10/15/2023   GLUCOSE 104 (H) 02/15/2024   CHOL 237 (H) 02/15/2024   TRIG  133.0 02/15/2024   HDL 63.40 02/15/2024   LDLDIRECT 132.5 05/06/2013   LDLCALC 147 (H) 02/15/2024   ALT 7 02/15/2024   AST 19 02/15/2024   NA 144 02/15/2024   K 3.9 02/15/2024   CL 106 02/15/2024   CREATININE 0.96 02/15/2024   BUN 17 02/15/2024   CO2 32 02/15/2024   TSH 2.55 02/15/2024   HGBA1C 5.7 10/19/2012    MM 3D SCREENING MAMMOGRAM BILATERAL BREAST Result Date: 01/22/2024 CLINICAL DATA:  Screening. EXAM: DIGITAL SCREENING BILATERAL MAMMOGRAM WITH TOMOSYNTHESIS AND CAD TECHNIQUE: Bilateral screening digital craniocaudal and mediolateral oblique mammograms were obtained. Bilateral screening digital breast tomosynthesis was performed. The images were evaluated with computer-aided detection. COMPARISON:  Previous exam(s). ACR Breast Density Category b: There are scattered areas of fibroglandular density. FINDINGS: There are no findings suspicious for malignancy. Limited visualization of the left axilla due to limited range of motion in the left shoulder. IMPRESSION: No mammographic evidence of malignancy. A result letter of this screening mammogram will be mailed directly to the patient. RECOMMENDATION: Screening mammogram in one year. (Code:SM-B-01Y) BI-RADS CATEGORY  1: Negative. Electronically Signed   By: Inocente Ast M.D.   On: 01/22/2024 12:50       Assessment & Plan:  Routine general medical examination at a health care facility  Hypercholesterolemia Assessment & Plan: Continue crestor . Follow lipid panel and liver function tests.   Orders: -     Hepatic function panel -     Lipid panel -     TSH  Primary hypertension Assessment & Plan: Continues carvedilol , hydrochlorothiazide . Per note, taking amlodipine . Blood pressure as outlined. Follow pressures.  Follow metabolic panel. No changes today.   Orders: -     Basic metabolic panel with GFR  Health care maintenance Assessment & Plan: Physical today 02/15/24.  Colonoscopy 04/2019 - tubular adenoma.  Follow up  colonoscopy in 3 years.  Has f/u scheduled 07/2024. Was told no further colonoscopies warranted. Mammogram 01/20/24 - Birads I.     Leg cramps Assessment & Plan: Better on current medication regimen.   Orders: -     Magnesium   Chronic knee pain (1ry area of Pain) (Right) Assessment & Plan: Seeing Dr Mardee. Chronic knee pain and swelling. Continue f/u with ortho.    Crohn's disease of large intestine without complication [facility]) Assessment & Plan: Bowels stable. Doing well. Has been followed by GI ([facility]). Continue sulfasalazine . Continue f/u with GI.    Left shoulder pain, unspecified chronicity Assessment & Plan: Planning injection tomorrow.       Allena Hamilton, MD

## 2024-02-16 ENCOUNTER — Ambulatory Visit: Payer: Self-pay | Admitting: Internal Medicine

## 2024-02-16 DIAGNOSIS — M19012 Primary osteoarthritis, left shoulder: Secondary | ICD-10-CM | POA: Diagnosis not present

## 2024-02-17 ENCOUNTER — Other Ambulatory Visit (INDEPENDENT_AMBULATORY_CARE_PROVIDER_SITE_OTHER): Payer: Self-pay | Admitting: Vascular Surgery

## 2024-02-17 DIAGNOSIS — M7989 Other specified soft tissue disorders: Secondary | ICD-10-CM

## 2024-02-18 ENCOUNTER — Encounter (INDEPENDENT_AMBULATORY_CARE_PROVIDER_SITE_OTHER): Payer: Self-pay | Admitting: Vascular Surgery

## 2024-02-18 ENCOUNTER — Other Ambulatory Visit (INDEPENDENT_AMBULATORY_CARE_PROVIDER_SITE_OTHER)

## 2024-02-18 ENCOUNTER — Ambulatory Visit (INDEPENDENT_AMBULATORY_CARE_PROVIDER_SITE_OTHER): Admitting: Vascular Surgery

## 2024-02-18 ENCOUNTER — Encounter (INDEPENDENT_AMBULATORY_CARE_PROVIDER_SITE_OTHER)

## 2024-02-18 VITALS — BP 163/78 | HR 65 | Ht 66.0 in | Wt 146.4 lb

## 2024-02-18 DIAGNOSIS — M7989 Other specified soft tissue disorders: Secondary | ICD-10-CM

## 2024-02-18 DIAGNOSIS — I890 Lymphedema, not elsewhere classified: Secondary | ICD-10-CM

## 2024-02-18 DIAGNOSIS — I10 Essential (primary) hypertension: Secondary | ICD-10-CM | POA: Diagnosis not present

## 2024-02-18 DIAGNOSIS — M1990 Unspecified osteoarthritis, unspecified site: Secondary | ICD-10-CM | POA: Diagnosis not present

## 2024-02-19 ENCOUNTER — Encounter (INDEPENDENT_AMBULATORY_CARE_PROVIDER_SITE_OTHER): Payer: Self-pay | Admitting: Vascular Surgery

## 2024-02-19 NOTE — Progress Notes (Signed)
 Subjective:    Patient ID: Ellen Drake, female    DOB: 12-Apr-1946, 78 y.o.   MRN: [id number] Chief Complaint  Patient presents with   fu pt conv + Reflux    Ellen Drake is a 78 yo female who returns to clinic today for follow-up related to bilateral lower extremity lymphedema.  Patient has started to use compression therapy at home intermittently.  She endorses her legs are less swollen and feel better today.  She denies having any pain.  She underwent bilateral lower extremity venous reflux ultrasounds today.  They are negative for reflux and DVT.  She states overall that her legs feel much better.    Review of Systems  Constitutional: Negative.   Cardiovascular:  Positive for leg swelling.  All other systems reviewed and are negative.      Objective:   Physical Exam Constitutional:      Appearance: Normal appearance. She is normal weight.  HENT:     Head: Normocephalic.  Eyes:     Pupils: Pupils are equal, round, and reactive to light.  Cardiovascular:     Rate and Rhythm: Normal rate and regular rhythm.     Pulses: Normal pulses.     Heart sounds: Normal heart sounds.  Pulmonary:     Effort: Pulmonary effort is normal.     Breath sounds: Normal breath sounds.  Abdominal:     General: Abdomen is flat. Bowel sounds are normal.     Palpations: Abdomen is soft.  Musculoskeletal:        General: Normal range of motion.  Skin:    General: Skin is warm and dry.     Capillary Refill: Capillary refill takes 2 to 3 seconds.  Neurological:     General: No focal deficit present.     Mental Status: She is alert and oriented to person, place, and time. Mental status is at baseline.  Psychiatric:        Mood and Affect: Mood normal.        Behavior: Behavior normal.        Thought Content: Thought content normal.        Judgment: Judgment normal.     BP (!) 163/78   Pulse 65   Ht 5' 6 (1.676 m)   Wt 146 lb 6 oz (66.4 kg)   LMP 08/13/1995   BMI 23.63 kg/m    Past Medical History:  Diagnosis Date   Anemia    reslved   Colon polyp 2009   Crohn's disease (HCC)    Diverticulosis    Fibrocystic breast disease    Grade I diastolic dysfunction    Herniated nucleus pulposus of lumbosacral region    History of chicken pox    Hypercholesterolemia    Hyperlipidemia    Hypertension    Mild aortic stenosis by prior echocardiogram    Mild mitral regurgitation by prior echocardiogram    Osteoarthritis    Pacemaker    Pulmonary nodule seen on imaging study 2004   resolved 2007   Second degree AV block, Mobitz type II     Social History   Socioeconomic History   Marital status: Married    Spouse name: Not on file   Number of children: 0   Years of education: 12   Highest education level: Not on file  Occupational History   Not on file  Tobacco Use   Smoking status: Former    Current packs/day: 0.00    Types: Cigarettes  Quit date: 09/10/2009    Years since quitting: 14.4   Smokeless tobacco: Never  Vaping Use   Vaping status: Never Used  Substance and Sexual Activity   Alcohol use: Yes    Alcohol/week: 1.0 standard drink of alcohol    Types: 1 Glasses of wine per week    Comment: wine occasional; socially   Drug use: No   Sexual activity: Yes  Other Topics Concern   Not on file  Social History Narrative   married   Social Drivers of Corporate investment banker Strain: Low Risk  (10/15/2023)   Received [facility]   Overall Financial Resource Strain (CARDIA)    Difficulty of Paying Living Expenses: Not hard at all  Food Insecurity: No Food Insecurity (10/15/2023)   Received from [facility]   Hunger Vital Sign    Within the past 12 months, you worried that your food would run out before you got the money to buy more.: Never true    Within the past 12 months, the food you bought just didn't last and you didn't have money to get more.: Never true  Transportation Needs: No  Transportation Needs (10/15/2023)   Received from [facility]    In the past 12 months, has lack of transportation kept you from medical appointments or from getting medications?: No    Lack of Transportation (Non-Medical): No  Physical Activity: Inactive (07/24/2023)   Exercise Vital Sign    Days of Exercise per Week: 0 days    Minutes of Exercise per Session: 0 min  Stress: No Stress Concern Present (07/24/2023)   [employer] of Occupational Health - Occupational Stress Questionnaire    Feeling of Stress : Not at all  Social Connections: Socially Integrated (07/24/2023)   Social Connection and Isolation Panel    Frequency of Communication with Friends and Family: More than three times a week    Frequency of Social Gatherings with Friends and Family: Twice a week    Attends Religious Services: More than 4 times per year    Active Member of [employer] or Organizations: Yes    Attends Engineer, structural: More than 4 times per year    Marital Status: Married  Catering manager Violence: Not At Risk (07/24/2023)   Humiliation, Afraid, Rape, and Kick questionnaire    Fear of Current or Ex-Partner: No    Emotionally Abused: No    Physically Abused: No    Sexually Abused: No    Past Surgical History:  Procedure Laterality Date   anterior cervical discectomy and fusion  1998   Dr Drury SANES SURGERY  1992   ruptured disc (Dr Kathi)   BREAST BIOPSY Left    neg-no scar seen   CATARACT EXTRACTION W/PHACO Right 02/09/2019   Procedure: CATARACT EXTRACTION PHACO AND INTRAOCULAR LENS PLACEMENT (IOC) KAHOOK DUAL BLADE GONIOTOMY  RIGHT HEALON 5, VISION BLUE;  Surgeon: Mittie Gaskin, MD;  Location: [facility];  Service: Ophthalmology;  Laterality: Right;   CATARACT EXTRACTION W/PHACO Left 11/11/2023   Procedure: PHACOEMULSIFICATION, CATARACT, WITH IOL INSERTION 11.75 01:03.7;  Surgeon: Mittie Gaskin, MD;  Location:  [facility];  Service: Ophthalmology;  Laterality: Left;   COLECTOMY  2003   COLON SURGERY  1998   COLONOSCOPY  2009   COLONOSCOPY W/ BIOPSIES  11/30/2012   Procedure: COLONOSCOPY W/BIOPSY; Surgeon: Elijah DELENA SHAUNNA Tanda, MD; Location: [facility]; Service: Gastroenterology;;  COLONOSCOPY W/ BIOPSIES  04/15/2016   Procedure: Colonoscopy ; Surgeon: Elijah DELENA SHAUNNA Tanda, MD; Location: [facility]; Service: Gastroenterology; Laterality: N/A;   KNEE ARTHROSCOPY W/ SYNOVECTOMY Right 03/10/2014   limited   KNEE SURGERY Right 1/16   [facility] (Dr. Durene)   PACEMAKER IMPLANT N/A 03/19/2023   Procedure: PACEMAKER IMPLANT;  Surgeon: Inocencio Soyla Lunger, MD;  Location: [facility];  Service: Cardiovascular;  Laterality: N/A;   POSTERIOR LAMINECTOMY / DECOMPRESSION LUMBAR SPINE  1999   REPLACEMENT TOTAL KNEE     ROTATOR CUFF REPAIR Left 2007   acute open; accident    Family History  Problem Relation Age of Onset   CVA Mother    Hypertension Mother    Hypertension Sister        brain tumor   Breast cancer Neg Hx    Colon cancer Neg Hx     Allergies  Allergen Reactions   Mercaptopurine Nausea Only and Other (See Comments)   Lyrica [Pregabalin]     Hand swelling   Dilaudid  [Hydromorphone  Hcl] Rash       Latest Ref Rng & Units 10/15/2023    1:45 PM 09/29/2023    1:16 PM 07/13/2023    3:05 PM  CBC  WBC 4.0 - 10.5 K/uL 7.4  6.6  7.0   Hemoglobin 12.0 - 15.0 g/dL 87.7  88.3  88.0   Hematocrit 36.0 - 46.0 % 36.3  34.7  35.9   Platelets 150.0 - 400.0 K/uL 179.0  178.0  180.0       CMP     Component Value Date/Time   NA 144 02/15/2024 1325   K 3.9 02/15/2024 1325   CL 106 02/15/2024 1325   CO2 32 02/15/2024 1325   GLUCOSE 104 (H) 02/15/2024 1325   BUN 17 02/15/2024 1325   CREATININE 0.96 02/15/2024 1325   CALCIUM  9.9 02/15/2024 1325   PROT 6.7 02/15/2024 1325   ALBUMIN 4.2 02/15/2024 1325   AST 19 02/15/2024 1325   ALT 7 02/15/2024 1325    ALKPHOS 57 02/15/2024 1325   BILITOT 0.4 02/15/2024 1325   GFR 56.78 (L) 02/15/2024 1325   GFRNONAA 58 (L) 03/20/2023 0832     No results found.     Assessment & Plan:   1. Lymphedema (Primary) The patient returns to the office for followup evaluation regarding leg swelling.  The swelling has improved quite a bit and the pain associated with swelling has decreased substantially. There have not been any interval development of a ulcerations or wounds.  Since the previous visit the patient has been wearing graduated compression stockings and has noted some improvement in the lymphedema. The patient has been using compression routinely morning until night.  The patient also states elevation during the day and exercise (such as walking) is being done too.  Patient to follow-up in 6 months with no studies.  2. Primary hypertension Continue antihypertensive medications as already ordered, these medications have been reviewed and there are no changes at this time.   3. Arthritis Continue arthritis medications as already ordered, these medications have been reviewed and there are no changes at this time.    Current Outpatient Medications on File Prior to Visit  Medication Sig Dispense Refill   amLODipine  (NORVASC ) 10 MG tablet Take 1 tablet (10 mg total) by mouth daily. 90 tablet 3   ASPIRIN 81 PO Take 1 tablet by mouth daily.     carvedilol  (COREG ) 12.5 MG tablet Take 1 tablet (  12.5 mg total) by mouth 2 (two) times daily. 90 tablet 3   cyanocobalamin  1000 MCG tablet Take 1,000 mcg by mouth daily.     folic acid (FOLVITE) 1 MG tablet Take 1 mg by mouth daily.     hydrochlorothiazide  (MICROZIDE ) 12.5 MG capsule TAKE 1 CAPSULE BY MOUTH EVERY DAY 90 capsule 1   latanoprost  (XALATAN ) 0.005 % ophthalmic solution Place 1 drop into both eyes at bedtime. 7.5 mL 4   MAGNESIUM  GLYCINATE PO Take 200 mg by mouth 2 (two) times daily.     potassium chloride  (KLOR-CON  M10) 10 MEQ tablet Take 2 tablets  (20 mEq total) by mouth 2 (two) times daily. 360 tablet 1   sulfaSALAzine  (AZULFIDINE ) 500 MG tablet Take 1,500 mg by mouth 2 (two) times daily. 3 tabs     timolol  (TIMOPTIC ) 0.5 % ophthalmic solution Place 1 drop into both eyes 2 (two) times daily.  3   traMADol  (ULTRAM ) 50 MG tablet Take 1 tablet (50 mg total) by mouth 2 (two) times daily. Each refill must last 30 days. 60 tablet 5   triamcinolone  cream (KENALOG ) 0.1 % Apply 1 Application topically as needed (prn). As needed     No current facility-administered medications on file prior to visit.    There are no Patient Instructions on file for this visit. No follow-ups on file.   Gwendlyn JONELLE Shank, NP

## 2024-02-21 ENCOUNTER — Encounter: Payer: Self-pay | Admitting: Internal Medicine

## 2024-02-21 NOTE — Assessment & Plan Note (Signed)
 Better on current medication regimen.

## 2024-02-21 NOTE — Assessment & Plan Note (Signed)
 Continue crestor. Follow lipid panel and liver function tests.

## 2024-02-21 NOTE — Assessment & Plan Note (Signed)
 Planning injection tomorrow.

## 2024-02-21 NOTE — Assessment & Plan Note (Signed)
 Continues carvedilol , hydrochlorothiazide . Per note, taking amlodipine . Blood pressure as outlined. Follow pressures.  Follow metabolic panel. No changes today.

## 2024-02-21 NOTE — Assessment & Plan Note (Signed)
 Bowels stable. Doing well. Has been followed by GI ([facility]). Continue sulfasalazine . Continue f/u with GI.

## 2024-02-21 NOTE — Assessment & Plan Note (Signed)
 Seeing Dr Aubry Blase. Chronic knee pain and swelling. Continue f/u with ortho.

## 2024-03-03 ENCOUNTER — Ambulatory Visit: Admitting: Family

## 2024-03-03 ENCOUNTER — Encounter: Payer: Self-pay | Admitting: Family

## 2024-03-03 VITALS — BP 119/65 | HR 90 | Temp 98.7°F | Ht 66.0 in | Wt 143.0 lb

## 2024-03-03 DIAGNOSIS — R051 Acute cough: Secondary | ICD-10-CM

## 2024-03-03 DIAGNOSIS — J029 Acute pharyngitis, unspecified: Secondary | ICD-10-CM | POA: Diagnosis not present

## 2024-03-03 DIAGNOSIS — U071 COVID-19: Secondary | ICD-10-CM

## 2024-03-03 LAB — POCT INFLUENZA A/B: Influenza A, POC: NEGATIVE

## 2024-03-03 LAB — POC COVID19 BINAXNOW: SARS Coronavirus 2 Ag: POSITIVE — AB

## 2024-03-03 MED ORDER — MOLNUPIRAVIR EUA 200MG CAPSULE
4.0000 | ORAL_CAPSULE | Freq: Two times a day (BID) | ORAL | 0 refills | Status: AC
Start: 2024-03-03 — End: 2024-03-08

## 2024-03-03 NOTE — Progress Notes (Signed)
 Assessment & Plan:  COVID-19 Assessment & Plan: Symptom onset 4 days ago.  Due to immunocompromise status, age, opted to treat with antiviral. H/o CKD . Extensive drug interactions with paxlovid with amlodipine , ASA, hydrochlorothiazide , tramadol   Molnupiravir  without drug interaction.  Start molnupiravir .  She will let me know how she is doing   Orders: -     molnupiravir  EUA; Take 4 capsules (800 mg total) by mouth 2 (two) times daily for 5 days.  Dispense: 40 capsule; Refill: 0  Sore throat -     POC COVID-19 BinaxNow -     POCT Influenza A/B  Acute cough -     POC COVID-19 BinaxNow -     POCT Influenza A/B     Return precautions given.   Risks, benefits, and alternatives of the medications and treatment plan prescribed today were discussed, and patient expressed understanding.   Education regarding symptom management and diagnosis given to patient on AVS either electronically or printed.  No follow-ups on file.  Rollene Northern, FNP  Subjective:    Patient ID: Ellen Drake, female    DOB: [birth year], 78 y.o.   MRN: [id number]  CC: Ellen Drake is a 78 y.o. female who presents today for an acute visit.    HPI: Complains of productive cough x 4 days ago Onset after traveling via train.  Endorses sore throat, tactile fever,  Cough is worse when laying down.  Denies CP, sob, wheezing, sinus pain, ear pain, diarrhea, vomiting.   Eating and drinking a lot of water  She lives with her husband.   She tried alselzter otc once without relief.   Blood pressure at home is usually 130/70. Denies associated dizziness.   She has had covid years ago.    H/o second degree AV block s/ pacemaker, HTN, crohns colitis  GFR 56  Allergies: Mercaptopurine, Lyrica [pregabalin], and Dilaudid  [hydromorphone  hcl] Current Outpatient Medications on File Prior to Visit  Medication Sig Dispense Refill   amLODipine  (NORVASC ) 10 MG tablet Take 1 tablet (10 mg total) by  mouth daily. 90 tablet 3   ASPIRIN 81 PO Take 1 tablet by mouth daily.     carvedilol  (COREG ) 12.5 MG tablet Take 1 tablet (12.5 mg total) by mouth 2 (two) times daily. 90 tablet 3   cyanocobalamin  1000 MCG tablet Take 1,000 mcg by mouth daily.     folic acid (FOLVITE) 1 MG tablet Take 1 mg by mouth daily.     hydrochlorothiazide  (MICROZIDE ) 12.5 MG capsule TAKE 1 CAPSULE BY MOUTH EVERY DAY 90 capsule 1   latanoprost  (XALATAN ) 0.005 % ophthalmic solution Place 1 drop into both eyes at bedtime. 7.5 mL 4   MAGNESIUM  GLYCINATE PO Take 200 mg by mouth 2 (two) times daily.     potassium chloride  (KLOR-CON  M10) 10 MEQ tablet Take 2 tablets (20 mEq total) by mouth 2 (two) times daily. 360 tablet 1   sulfaSALAzine  (AZULFIDINE ) 500 MG tablet Take 1,500 mg by mouth 2 (two) times daily. 3 tabs     timolol  (TIMOPTIC ) 0.5 % ophthalmic solution Place 1 drop into both eyes 2 (two) times daily.  3   traMADol  (ULTRAM ) 50 MG tablet Take 1 tablet (50 mg total) by mouth 2 (two) times daily. Each refill must last 30 days. 60 tablet 5   triamcinolone  cream (KENALOG ) 0.1 % Apply 1 Application topically as needed (prn). As needed     No current facility-administered medications on file prior to visit.  Review of Systems  Constitutional:  Negative for chills and fever.  HENT:  Positive for congestion and sore throat. Negative for sinus pain.   Eyes:  Negative for visual disturbance.  Respiratory:  Negative for cough and shortness of breath.   Cardiovascular:  Negative for chest pain, palpitations and leg swelling.  Gastrointestinal:  Negative for nausea and vomiting.  Neurological:  Negative for dizziness and headaches.      Objective:    BP 119/65   Pulse 90   Temp 98.7 F (37.1 C)   Ht 5' 6 (1.676 m)   Wt 143 lb (64.9 kg)   LMP 08/13/1995   SpO2 98%   BMI 23.08 kg/m   BP Readings from Last 3 Encounters:  03/03/24 119/65  02/18/24 (!) 163/78  02/15/24 132/78   Wt Readings from Last 3  Encounters:  03/03/24 143 lb (64.9 kg)  02/18/24 146 lb 6 oz (66.4 kg)  02/15/24 147 lb 6.4 oz (66.9 kg)    Physical Exam Vitals reviewed.  Constitutional:      Appearance: She is well-developed.  HENT:     Head: Normocephalic and atraumatic.     Right Ear: Hearing, tympanic membrane, ear canal and external ear normal. No decreased hearing noted. No drainage, swelling or tenderness. No middle ear effusion. No foreign body. Tympanic membrane is not erythematous or bulging.     Left Ear: Hearing, tympanic membrane, ear canal and external ear normal. No decreased hearing noted. No drainage, swelling or tenderness.  No middle ear effusion. No foreign body. Tympanic membrane is not erythematous or bulging.     Nose: Nose normal. No rhinorrhea.     Right Sinus: No maxillary sinus tenderness or frontal sinus tenderness.     Left Sinus: No maxillary sinus tenderness or frontal sinus tenderness.     Mouth/Throat:     Pharynx: Uvula midline. No oropharyngeal exudate or posterior oropharyngeal erythema.     Tonsils: No tonsillar abscesses.  Eyes:     Conjunctiva/sclera: Conjunctivae normal.  Cardiovascular:     Rate and Rhythm: Regular rhythm.     Pulses: Normal pulses.     Heart sounds: Normal heart sounds.  Pulmonary:     Effort: Pulmonary effort is normal.     Breath sounds: Normal breath sounds. No wheezing, rhonchi or rales.  Lymphadenopathy:     Head:     Right side of head: No submental, submandibular, tonsillar, preauricular, posterior auricular or occipital adenopathy.     Left side of head: No submental, submandibular, tonsillar, preauricular, posterior auricular or occipital adenopathy.     Cervical: No cervical adenopathy.  Skin:    General: Skin is warm and dry.  Neurological:     Mental Status: She is alert.  Psychiatric:        Speech: Speech normal.        Behavior: Behavior normal.        Thought Content: Thought content normal.

## 2024-03-03 NOTE — Patient Instructions (Addendum)
 Please ensure you are drinking plenty of water; your blood pressure low end today.  Please call the office if you have persistent blood pressure reading 110/70 or less as this may warrant REDUCING dose of blood pressure medication.   You may start PLAIN Mucinex  ( guaifenesin ) which you can help break up thick congestion.  Please ensure you are drinking plenty of water with this medication.  Please stay in quarantine per cdc recommendation  Respiratory Virus Guidance Update Frequently Asked Questions  Respiratory Illnesses  CDC   Updated Guidance: The updated Respiratory Virus Guidance recommends that people stay home and away from others until at least 24 hours after both their symptoms are getting better overall, and they have not had a fever (and are not using fever-reducing medication). Note that depending on the length of symptoms, this period could be shorter, the same, or longer than the previous guidance for COVID-19. It is important to note that the guidance doesn't end with staying home and away from others when sick. The guidance encourages added precaution over the next five days after time at home, away from others, is over. Since some people remain contagious beyond the stay-at-home period, a period of added precaution using prevention strategies, such as: Taking more steps for cleaner air; Enhancing hygiene practices; Wearing a well-fitting mask; Keeping a distance from others; and/or Getting tested for respiratory viruses can lower the chance of spreading respiratory viruses to others.  We discussed starting Molnupiravir  which is an unapproved drug that is authorized for use under an Emergency Use Authorization.  There are no adequate, approved, available products for the treatment of COVID-19 in adults who have mild-to-moderate COVID-19 and are at high risk for progressing to severe COVID-19, including hospitalization or death.  I have sent  Molnupiravir  to your pharmacy. Please  call pharmacy so they bring medication out to your car and you do not have to go inside.   This medication is not recommended in pregnancy.    COMMON SIDE EFFECTS: Diarrhea Nausea dizziness   If your COVID-19 symptoms get worse, get medical help right away. Call 911 if you experience symptoms such as worsening cough, trouble breathing, chest pain that doesn't go away, confusion, a hard time staying awake, and pale or blue-colored skin.This medication won't prevent all COVID-19 cases from getting worse.   Molnupiravir  Oral Capsules What is this medication? MOLNUPIRAVIR  (mol nue pir a vir) treats COVID-19. It is an antiviral medication. It may decrease the risk of developing severe symptoms of COVID-19. It may also decrease the chance of going to the hospital. This medication is not approved by the FDA. The FDA has authorized emergency use of thismedication during the COVID-19 pandemic. This medicine may be used for other purposes; ask your health care provider orpharmacist if you have questions. What should I tell my care team before I take this medication? They need to know if you have any of these conditions: Any allergies Any serious illness An unusual or allergic reaction to molnupiravir , other medications, foods, dyes, or preservatives Pregnant or trying to get pregnant Breast-feeding How should I use this medication? Take this medication by mouth with water. Take it as directed on the prescription label at the same time every day. Do not cut, crush or chew this medication. Swallow the capsules whole. You can take it with or without food. If it upsets your stomach, take it with food. Take all of this medication unless your care team tells you to stop it early. Keep  taking it even if youthink you are better. Talk to your care team about the use of this medication in children. Specialcare may be needed. Overdosage: If you think you have taken too much of this medicine contact apoison  control center or emergency room at once. NOTE: This medicine is only for you. Do not share this medicine with others. What if I miss a dose? If you miss a dose, take it as soon as you can unless it is more than 10 hours late. If it is more than 10 hours late, skip the missed dose. Take the next dose at the normal time. Do not take extra or 2 doses at the same time to makeup for the missed dose. What may interact with this medication? Interactions have not been studied. This list may not describe all possible interactions. Give your health care provider a list of all the medicines, herbs, non-prescription drugs, or dietary supplements you use. Also tell them if you smoke, drink alcohol, or use illegaldrugs. Some items may interact with your medicine. What should I watch for while using this medication? Your condition will be monitored carefully while you are receiving this medication. Visit your care team for regular checkups. Tell your care team ifyour symptoms do not start to get better or if they get worse. Do not become pregnant while taking this medication. You may need a pregnancy test before starting this medication. Women must use a reliable form of birth control while taking this medication and for 4 days after stopping the medication. Women should inform their care team if they wish to become pregnant or think they might be pregnant. Men should not father a child while taking this medication and for 3 months after stopping it. There is potential for serious harm to an unborn child. Talk to your care team for more information. Do not breast-feed an infant while taking this medication and for 4 days afterstopping the medication. What side effects may I notice from receiving this medication? Side effects that you should report to your care team as soon as possible: Allergic reactions-skin rash, itching, hives, swelling of the face, lips, tongue, or throat Side effects that usually do not require  medical attention (report these toyour care team if they continue or are bothersome): Diarrhea Dizziness Nausea This list may not describe all possible side effects. Call your doctor for medical advice about side effects. You may report side effects to FDA at1-800-FDA-1088. Where should I keep my medication? Keep out of the reach of children and pets. Store at room temperature between 20 and 25 degrees C (68 and 77 degrees F).Get rid of any unused medication after the expiration date. To get rid of medications that are no longer needed or have expired: Take the medication to a medication take-back program. Check with your pharmacy or law enforcement to find a location. If you cannot return the medication, check the label or package insert to see if the medication should be thrown out in the garbage or flushed down the toilet. If you are not sure, ask your care team. If it is safe to put it in the trash, take the medication out of the container. Mix the medication with cat litter, dirt, coffee grounds, or other unwanted substance. Seal the mixture in a bag or container. Put it in the trash. NOTE: This sheet is a summary. It may not cover all possible information. If you have questions about this medicine, talk to your doctor, pharmacist, orhealth care provider.  2022 Elsevier/Gold Standard (2020-08-06 16:16:01)

## 2024-03-03 NOTE — Assessment & Plan Note (Addendum)
 Symptom onset 4 days ago.  Due to immunocompromise status, age, opted to treat with antiviral. H/o CKD . Extensive drug interactions with paxlovid with amlodipine , ASA, hydrochlorothiazide , tramadol   Molnupiravir  without drug interaction.  Start molnupiravir .  She will let me know how she is doing

## 2024-03-07 ENCOUNTER — Ambulatory Visit: Payer: Self-pay

## 2024-03-07 NOTE — Telephone Encounter (Signed)
 Patient tested COVID positive during a visit with you end of last week and needing something to help with her cough. She is not using anything OTC. She is on molnupiravir . Has 5 pills left. Confirmed is feeling a little better each day. No worsening symptoms or SOB. Would like to know if you can recommend cough syrup OTC or send something in that she can take.

## 2024-03-07 NOTE — Telephone Encounter (Signed)
 Call pt Glad she is feeling better  Recommend OTC mucinex  DM.

## 2024-03-07 NOTE — Telephone Encounter (Signed)
 Spoke to pt she stated that she will pick up the Mucinex  per margaret recommendation, she is not really feeling a lot better, but will call back if she needs to

## 2024-03-07 NOTE — Telephone Encounter (Signed)
 FYI Only or Action Required?: Action required by provider: states cough medication that was called in is not helping and would like something else called in to pharmacy.  Patient was last seen in primary care on 03/03/2024 by Dineen Rollene MATSU, FNP.  Called Nurse Triage reporting Cough.  Symptoms began a week ago.  Interventions attempted: Nothing.  Symptoms are: gradually worsening.  Triage Disposition: See PCP When Office is Open (Within 3 Days)  Patient/caregiver understands and will follow disposition?: No, wishes to speak with PCP        Copied from CRM #[id number]. Topic: Clinical - Medical Advice >> Mar 07, 2024  9:36 AM Thersia BROCKS wrote: Reason for CRM: Patient called in stated she was tested for covid last week, stated she is getting better but the coughing and phlegm, wanted to know what kind of medication she could take for that Reason for Disposition  [1] Nasal discharge AND [2] present > 10 days  Answer Assessment - Initial Assessment Questions 1. ONSET: When did the cough begin?      Week ago, was seen in the office last week 2. SEVERITY: How bad is the cough today?      Getting worse 3. SPUTUM: Describe the color of your sputum (e.g., none, dry cough; clear, white, yellow, green)     white 4. HEMOPTYSIS: Are you coughing up any blood? If Yes, ask: How much? (e.g., flecks, streaks, tablespoons, etc.)     no 5. DIFFICULTY BREATHING: Are you having difficulty breathing? If Yes, ask: How bad is it? (e.g., mild, moderate, severe)      no 6. FEVER: Do you have a fever? If Yes, ask: What is your temperature, how was it measured, and when did it start?     Yesterday, alittle fever 7. CARDIAC HISTORY: Do you have any history of heart disease? (e.g., heart attack, congestive heart failure)      Pace maker 8. LUNG HISTORY: Do you have any history of lung disease?  (e.g., pulmonary embolus, asthma, emphysema)     denies 9. PE RISK FACTORS: Do you  have a history of blood clots? (or: recent major surgery, recent prolonged travel, bedridden)     denies 10. OTHER SYMPTOMS: Do you have any other symptoms? (e.g., runny nose, wheezing, chest pain)       Runny nose 11. PREGNANCY: Is there any chance you are pregnant? When was your last menstrual period?       na 12. TRAVEL: Have you traveled out of the country in the last month? (e.g., travel history, exposures)       no  Protocols used: Cough - Acute Productive-A-AH

## 2024-03-09 ENCOUNTER — Other Ambulatory Visit: Payer: Self-pay

## 2024-03-09 ENCOUNTER — Ambulatory Visit: Payer: Self-pay

## 2024-03-09 ENCOUNTER — Ambulatory Visit (INDEPENDENT_AMBULATORY_CARE_PROVIDER_SITE_OTHER)

## 2024-03-09 VITALS — BP 118/70 | HR 68 | Temp 97.6°F | Ht 66.0 in | Wt 140.8 lb

## 2024-03-09 DIAGNOSIS — J3489 Other specified disorders of nose and nasal sinuses: Secondary | ICD-10-CM

## 2024-03-09 DIAGNOSIS — R059 Cough, unspecified: Secondary | ICD-10-CM | POA: Diagnosis not present

## 2024-03-09 DIAGNOSIS — R051 Acute cough: Secondary | ICD-10-CM

## 2024-03-09 MED ORDER — GUAIFENESIN-CODEINE 100-10 MG/5ML PO SOLN: 5.0000 mL | Freq: Three times a day (TID) | ORAL | 0 refills | Status: DC | PRN

## 2024-03-09 MED ORDER — FLUTICASONE PROPIONATE 50 MCG/ACT NA SUSP: 2.0000 | Freq: Every day | NASAL | 0 refills | Status: DC

## 2024-03-09 MED ORDER — PREDNISONE 20 MG PO TABS: 40.0000 mg | ORAL_TABLET | Freq: Every day | ORAL | 0 refills | Status: AC

## 2024-03-09 NOTE — Patient Instructions (Addendum)
 1) Recommend Guaifensin-codeine  100-10 mg/5 ml, three times a day prn (do not exceed 120 mg of codeine  in 24 hours).  2) Start taking Prednisone  40 mg once a day in the morning with food for 5 days.  3) Use nasal flonase  2 puffs in each nostril daily for the next 10 days.  4) If you develop chest pain, swelling in your legs, fever, difficulty breathing, worsening shortness of breath you should be seen in the ER.  5) Honey can help sooth throat and help with cough.  6) Stay hydrated. Warm salt water gargle 4-5 times a day. Plenty of rest, but make sure your are walking around every 2-3 hours.

## 2024-03-09 NOTE — Telephone Encounter (Signed)
 FYI Only or Action Required?: Action required by provider: request for appointment.  Patient was last seen in primary care on 03/03/2024 by Ellen Rollene MATSU, FNP.  Called Nurse Triage reporting Covid Positive.  Symptoms began a week ago.  Interventions attempted: OTC medications: mucinex .  Symptoms are: gradually worsening.  Triage Disposition: See Physician Within 24 Hours  Patient/caregiver understands and will follow disposition?: YesCopied from CRM [id number]. Topic: Clinical - Red Word Triage >> Mar 09, 2024  9:48 AM Ellen Drake wrote: Red Word that prompted transfer to Nurse Triage: patient was seen last week symptoms vomiting, coughing, mucus. Symptoms are getting worse causing her to be sore. Reason for Disposition  SEVERE coughing spells (e.g., whooping sound after coughing, vomiting after coughing)  Answer Assessment - Initial Assessment Questions 1. ONSET: When did the cough begin?      Over a week ago 2. SEVERITY: How bad is the cough today?      Hard time sleeping 3. SPUTUM: Describe the color of your sputum (e.g., none, dry cough; clear, white, yellow, green)     white 4. HEMOPTYSIS: Are you coughing up any blood? If Yes, ask: How much? (e.g., flecks, streaks, tablespoons, etc.)     na 5. DIFFICULTY BREATHING: Are you having difficulty breathing? If Yes, ask: How bad is it? (e.g., mild, moderate, severe)      denies 6. FEVER: Do you have a fever? If Yes, ask: What is your temperature, how was it measured, and when did it start?     Not sure 7. CARDIAC HISTORY: Do you have any history of heart disease? (e.g., heart attack, congestive heart failure)      pacemaker 8. LUNG HISTORY: Do you have any history of lung disease?  (e.g., pulmonary embolus, asthma, emphysema)     denies 9. PE RISK FACTORS: Do you have a history of blood clots? (or: recent major surgery, recent prolonged travel, bedridden)     Na  10. OTHER SYMPTOMS: Do you have any other  symptoms? (e.g., runny nose, wheezing, chest pain)       Coughing causing the vomiting    Pt finished Covid medication Monday. Pt has been taken Mucinex  2x/day and causing pt to cough worse and coughing is casing pt to vomit.  throat is getting sore again. Pt stated I just feel bad.  Protocols used: Cough - Acute Productive-A-AH

## 2024-03-09 NOTE — Assessment & Plan Note (Signed)
 Secondary to COVID-19, positive on 03/03/24.  Finished 5 days course of Molnupiravir .  Chest x-ray done to r/o pneumonia: no pneumonia, result discussed with the patient.  Discussed post-viral cough may persists for 8 weeks and sometimes even longer from COVID-19 viral infection.  Discussed increased risk of PE, if she were to develop dyspnea, worsening shortness of breath, leg edema, chest pain, or worsening symptoms recommended she be evaluated in the ED.  Start Prednisone  40 mg daily for 5 days. Medication s/e including worsening anxiety, GI bleed discussed. Take it in morning with food to reduce s/e.  Nasal Flonase  2 puffs in each nostril to help with nasal discharge.  Guaifenesin -codine 100-10 mg/5 ml, take 5 ml up to 3 times a day prn for cough. Patient counseled on s/e including but not limited to sedation, respiratory depression and potential for abuse specially in the context of being on Tramadol  for pain control.  PDMP reviewed.

## 2024-03-09 NOTE — Telephone Encounter (Signed)
 Noted. Patient is scheduled with Dr Abbey today at 4 pm.

## 2024-03-09 NOTE — Progress Notes (Signed)
 Acute Office Visit  Subjective:    Patient ID: Ellen Drake, female    DOB: Jul 28, 1946, 78 y.o.   MRN: [id number]  Chief Complaint  Patient presents with   Covid Positive    HPI Discussed the use of AI scribe software for clinical note transcription with the patient, who gave verbal consent to proceed.  History of Present Illness Discussed the use of AI scribe software for clinical note transcription with the patient, who gave verbal consent to proceed.  History of Present Illness  Ellen Drake is a 78 year old female with h/o HTN,  Crohn's disease who presents with persistent COVID-19 symptoms.   She was diagnosed with COVID-19 on March 03, 2024, after experiencing symptoms since February 27, 2024. Initial symptoms included sore throat, neck pain, cough, and phlegm production. She was treated with Molnupiravir  for 5 days but has not noticed significant improvement in symptoms.   She continues to experience a sore throat and neck pain, productive (white phlegm) cough. She has night sweats but no fever, chills, or chest pain, palpitations, SOB, orthopnea. She has post-nasal drip. Mucinex  DM worsened her symptoms, causing stomach upset.  No headaches, light sensitivity, ear pain, diarrhea, constipation, and body aches, except for shoulder soreness from coughing. She maintains hydration by drinking water but reports a lack of appetite.    ROS As per HPI    Objective:    BP 118/70 (BP Location: Left Arm, Patient Position: Sitting, Cuff Size: Small)   Pulse 68   Temp 97.6 F (36.4 C) (Oral)   Ht 5' 6 (1.676 m)   Wt 140 lb 12.8 oz (63.9 kg)   LMP 08/13/1995   SpO2 92%   BMI 22.73 kg/m    Physical Exam Constitutional:      General: She is not in acute distress. HENT:     Right Ear: Tympanic membrane and external ear normal. Tympanic membrane is not erythematous or bulging.     Left Ear: Tympanic membrane and external ear normal. Tympanic membrane is not erythematous  or bulging.     Mouth/Throat:     Mouth: Mucous membranes are moist.     Pharynx: No oropharyngeal exudate or posterior oropharyngeal erythema.  Cardiovascular:     Rate and Rhythm: Normal rate.  Pulmonary:     Effort: Pulmonary effort is normal.     Breath sounds: Normal breath sounds. No wheezing or rales.  Abdominal:     Palpations: Abdomen is soft.     Tenderness: There is no guarding.  Musculoskeletal:     Cervical back: Neck supple. No rigidity.     Right lower leg: No edema.     Left lower leg: No edema.  Lymphadenopathy:     Cervical: No cervical adenopathy.  Skin:    General: Skin is warm.  Neurological:     Mental Status: She is alert and oriented to person, place, and time.  Psychiatric:        Mood and Affect: Mood normal.     No results found for any visits on 03/09/24.     Assessment & Plan:  Acute cough Assessment & Plan: Secondary to COVID-19, positive on 03/03/24.  Finished 5 days course of Molnupiravir .  Chest x-ray done to r/o pneumonia: no pneumonia, result discussed with the patient.  Discussed post-viral cough may persists for 8 weeks and sometimes even longer from COVID-19 viral infection.  Discussed increased risk of PE, if she were to develop dyspnea, worsening shortness of  breath, leg edema, chest pain, or worsening symptoms recommended she be evaluated in the ED.  Start Prednisone  40 mg daily for 5 days. Medication s/e including worsening anxiety, GI bleed discussed. Take it in morning with food to reduce s/e.  Nasal Flonase  2 puffs in each nostril to help with nasal discharge.  Guaifenesin -codine 100-10 mg/5 ml, take 5 ml up to 3 times a day prn for cough. Patient counseled on s/e including but not limited to sedation, respiratory depression and potential for abuse specially in the context of being on Tramadol  for pain control.  PDMP reviewed.    Orders: -     DG Chest 2 View; Future -     predniSONE ; Take 2 tablets (40 mg total) by mouth daily  with breakfast for 5 days.  Dispense: 10 tablet; Refill: 0 -     Fluticasone  Propionate; Place 2 sprays into both nostrils daily.  Dispense: 16 g; Refill: 0 -     guaiFENesin -Codeine ; Take 5 mLs by mouth 3 (three) times daily as needed for cough.  Dispense: 120 mL; Refill: 0    Return if symptoms worsen or fail to improve.  Luke Shade, MD

## 2024-03-10 DIAGNOSIS — J3489 Other specified disorders of nose and nasal sinuses: Secondary | ICD-10-CM | POA: Insufficient documentation

## 2024-03-16 ENCOUNTER — Encounter: Admitting: Nurse Practitioner

## 2024-03-18 ENCOUNTER — Ambulatory Visit (INDEPENDENT_AMBULATORY_CARE_PROVIDER_SITE_OTHER): Payer: Medicare HMO

## 2024-03-18 DIAGNOSIS — I441 Atrioventricular block, second degree: Secondary | ICD-10-CM | POA: Diagnosis not present

## 2024-03-18 LAB — CUP PACEART REMOTE DEVICE CHECK
Battery Remaining Longevity: 134 mo
Battery Voltage: 3.06 V
Brady Statistic AP VP Percent: 4.77 %
Brady Statistic AP VS Percent: 1.18 %
Brady Statistic AS VP Percent: 43.87 %
Brady Statistic AS VS Percent: 50.17 %
Brady Statistic RA Percent Paced: 5.94 %
Brady Statistic RV Percent Paced: 48.65 %
Date Time Interrogation Session: 20250807230524
Implantable Lead Connection Status: 753985
Implantable Lead Connection Status: 753985
Implantable Lead Implant Date: 20240808
Implantable Lead Implant Date: 20240808
Implantable Lead Location: 753859
Implantable Lead Location: 753860
Implantable Lead Model: 3830
Implantable Lead Model: 5076
Implantable Pulse Generator Implant Date: 20240808
Lead Channel Impedance Value: 285 Ohm
Lead Channel Impedance Value: 342 Ohm
Lead Channel Impedance Value: 380 Ohm
Lead Channel Impedance Value: 513 Ohm
Lead Channel Pacing Threshold Amplitude: 0.5 V
Lead Channel Pacing Threshold Amplitude: 1.125 V
Lead Channel Pacing Threshold Pulse Width: 0.4 ms
Lead Channel Pacing Threshold Pulse Width: 0.4 ms
Lead Channel Sensing Intrinsic Amplitude: 16.625 mV
Lead Channel Sensing Intrinsic Amplitude: 16.625 mV
Lead Channel Sensing Intrinsic Amplitude: 3.75 mV
Lead Channel Sensing Intrinsic Amplitude: 3.75 mV
Lead Channel Setting Pacing Amplitude: 1.5 V
Lead Channel Setting Pacing Amplitude: 2.5 V
Lead Channel Setting Pacing Pulse Width: 0.4 ms
Lead Channel Setting Sensing Sensitivity: 1.2 mV
Zone Setting Status: 755011

## 2024-03-21 ENCOUNTER — Ambulatory Visit: Payer: Self-pay | Admitting: Cardiology

## 2024-03-22 DIAGNOSIS — H401133 Primary open-angle glaucoma, bilateral, severe stage: Secondary | ICD-10-CM | POA: Diagnosis not present

## 2024-03-22 DIAGNOSIS — Z961 Presence of intraocular lens: Secondary | ICD-10-CM | POA: Diagnosis not present

## 2024-03-24 ENCOUNTER — Encounter: Admitting: Nurse Practitioner

## 2024-04-19 DIAGNOSIS — M75122 Complete rotator cuff tear or rupture of left shoulder, not specified as traumatic: Secondary | ICD-10-CM | POA: Diagnosis not present

## 2024-04-19 DIAGNOSIS — M19012 Primary osteoarthritis, left shoulder: Secondary | ICD-10-CM | POA: Diagnosis not present

## 2024-04-19 DIAGNOSIS — M19212 Secondary osteoarthritis, left shoulder: Secondary | ICD-10-CM | POA: Diagnosis not present

## 2024-04-24 ENCOUNTER — Other Ambulatory Visit: Payer: Self-pay | Admitting: Internal Medicine

## 2024-05-05 DIAGNOSIS — L932 Other local lupus erythematosus: Secondary | ICD-10-CM | POA: Diagnosis not present

## 2024-05-06 NOTE — Progress Notes (Signed)
 Remote PPM Transmission

## 2024-05-26 DIAGNOSIS — M19212 Secondary osteoarthritis, left shoulder: Secondary | ICD-10-CM | POA: Diagnosis not present

## 2024-05-26 DIAGNOSIS — M75122 Complete rotator cuff tear or rupture of left shoulder, not specified as traumatic: Secondary | ICD-10-CM | POA: Diagnosis not present

## 2024-06-02 ENCOUNTER — Other Ambulatory Visit: Payer: Self-pay | Admitting: Internal Medicine

## 2024-06-02 NOTE — Telephone Encounter (Unsigned)
 Copied from CRM #[id number]. Topic: Clinical - Medication Refill >> Jun 02, 2024 12:43 PM Mia F wrote: Medication: sulfaSALAzine  (AZULFIDINE ) 500 MG tablet   Has the patient contacted their pharmacy? No (Agent: If no, request that the patient contact the pharmacy for the refill. If patient does not wish to contact the pharmacy document the reason why and proceed with request.) (Agent: If yes, when and what did the pharmacy advise?)  This is the patient's preferred pharmacy:  [id number] - [place], [place] - [address] [place] [address] Phone: [phone] Fax: [phone]   Is this the correct pharmacy for this prescription? Yes If no, delete pharmacy and type the correct one.   Has the prescription been filled recently? Yes  Is the patient out of the medication? No  Has the patient been seen for an appointment in the last year OR does the patient have an upcoming appointment? Yes  Can we respond through MyChart? No  Agent: Please be advised that Rx refills may take up to 3 business days. We ask that you follow-up with your pharmacy.

## 2024-06-03 NOTE — Telephone Encounter (Signed)
 Called to make patient aware of Dr Freda recommendations. Patient was not home at the moment and family member was informed to ask patient to give our office a call back.   OK for E2C2 to relay note if patient calls back. If relayed, please notify the office.

## 2024-06-03 NOTE — Telephone Encounter (Signed)
 Do not prescribe this medication. Please notify - let me know if problems.

## 2024-06-07 ENCOUNTER — Telehealth: Payer: Self-pay | Admitting: Cardiology

## 2024-06-07 NOTE — Telephone Encounter (Signed)
 Patient stated she is having soreness around the incision site and wants a call back on next steps.

## 2024-06-08 NOTE — Telephone Encounter (Signed)
 Outreach made to Pt.  Per Pt her device site feels fine today.  No redness or s/s of infection.   She states she will continue to monitor and call back if any further concerns.

## 2024-06-10 ENCOUNTER — Other Ambulatory Visit: Payer: Self-pay | Admitting: *Deleted

## 2024-06-10 DIAGNOSIS — M25569 Pain in unspecified knee: Secondary | ICD-10-CM

## 2024-06-10 DIAGNOSIS — I10 Essential (primary) hypertension: Secondary | ICD-10-CM

## 2024-06-10 DIAGNOSIS — M1990 Unspecified osteoarthritis, unspecified site: Secondary | ICD-10-CM

## 2024-06-10 DIAGNOSIS — E7800 Pure hypercholesterolemia, unspecified: Secondary | ICD-10-CM

## 2024-06-14 ENCOUNTER — Other Ambulatory Visit (INDEPENDENT_AMBULATORY_CARE_PROVIDER_SITE_OTHER)

## 2024-06-14 DIAGNOSIS — E7800 Pure hypercholesterolemia, unspecified: Secondary | ICD-10-CM | POA: Diagnosis not present

## 2024-06-14 DIAGNOSIS — M25569 Pain in unspecified knee: Secondary | ICD-10-CM

## 2024-06-14 DIAGNOSIS — M1990 Unspecified osteoarthritis, unspecified site: Secondary | ICD-10-CM

## 2024-06-14 DIAGNOSIS — I10 Essential (primary) hypertension: Secondary | ICD-10-CM

## 2024-06-14 LAB — BASIC METABOLIC PANEL WITH GFR
BUN: 17 mg/dL (ref 6–23)
CO2: 30 meq/L (ref 19–32)
Calcium: 9.4 mg/dL (ref 8.4–10.5)
Chloride: 104 meq/L (ref 96–112)
Creatinine, Ser: 0.84 mg/dL (ref 0.40–1.20)
GFR: 66.49 mL/min (ref >60.00)
Glucose, Bld: 85 mg/dL (ref 70–99)
Potassium: 4.1 meq/L (ref 3.5–5.1)
Sodium: 141 meq/L (ref 135–145)

## 2024-06-14 LAB — CBC WITH DIFFERENTIAL/PLATELET
Basophils Absolute: 0 K/uL (ref 0.0–0.1)
Basophils Relative: 0.4 % (ref 0.0–3.0)
Eosinophils Absolute: 0.1 K/uL (ref 0.0–0.7)
Eosinophils Relative: 1 % (ref 0.0–5.0)
HCT: 36.6 % (ref 36.0–46.0)
Hemoglobin: 12.3 g/dL (ref 12.0–15.0)
Lymphocytes Relative: 22.2 % (ref 12.0–46.0)
Lymphs Abs: 1.5 K/uL (ref 0.7–4.0)
MCHC: 33.5 g/dL (ref 30.0–36.0)
MCV: 98.7 fl (ref 78.0–100.0)
Monocytes Absolute: 0.6 K/uL (ref 0.1–1.0)
Monocytes Relative: 8.2 % (ref 3.0–12.0)
Neutro Abs: 4.7 K/uL (ref 1.4–7.7)
Neutrophils Relative %: 68.2 % (ref 43.0–77.0)
Platelets: 168 K/uL (ref 150.0–400.0)
RBC: 3.7 Mil/uL — ABNORMAL LOW (ref 3.87–5.11)
RDW: 13.4 % (ref 11.5–15.5)
WBC: 6.8 K/uL (ref 4.0–10.5)

## 2024-06-14 LAB — HEPATIC FUNCTION PANEL
ALT: 10 U/L (ref 0–35)
AST: 20 U/L (ref 0–37)
Albumin: 4 g/dL (ref 3.5–5.2)
Alkaline Phosphatase: 52 U/L (ref 39–117)
Bilirubin, Direct: 0.1 mg/dL (ref 0.0–0.3)
Total Bilirubin: 0.4 mg/dL (ref 0.2–1.2)
Total Protein: 6.6 g/dL (ref 6.0–8.3)

## 2024-06-14 LAB — LIPID PANEL
Cholesterol: 264 mg/dL — ABNORMAL HIGH (ref 0–200)
HDL: 72.6 mg/dL (ref >39.00)
LDL Cholesterol: 173 mg/dL — ABNORMAL HIGH (ref 0–99)
NonHDL: 191.08
Total CHOL/HDL Ratio: 4
Triglycerides: 89 mg/dL (ref 0.0–149.0)
VLDL: 17.8 mg/dL (ref 0.0–40.0)

## 2024-06-15 ENCOUNTER — Ambulatory Visit: Payer: Self-pay | Admitting: Internal Medicine

## 2024-06-17 ENCOUNTER — Ambulatory Visit (INDEPENDENT_AMBULATORY_CARE_PROVIDER_SITE_OTHER): Payer: Medicare HMO

## 2024-06-17 DIAGNOSIS — I441 Atrioventricular block, second degree: Secondary | ICD-10-CM

## 2024-06-19 LAB — CUP PACEART REMOTE DEVICE CHECK
Battery Remaining Longevity: 127 mo
Battery Voltage: 3.05 V
Brady Statistic AP VP Percent: 11.94 %
Brady Statistic AP VS Percent: 2.13 %
Brady Statistic AS VP Percent: 11.96 %
Brady Statistic AS VS Percent: 73.96 %
Brady Statistic RA Percent Paced: 14.06 %
Brady Statistic RV Percent Paced: 23.91 %
Date Time Interrogation Session: 20251107125516
Implantable Lead Connection Status: 753985
Implantable Lead Connection Status: 753985
Implantable Lead Implant Date: 20240808
Implantable Lead Implant Date: 20240808
Implantable Lead Location: 753859
Implantable Lead Location: 753860
Implantable Lead Model: 3830
Implantable Lead Model: 5076
Implantable Pulse Generator Implant Date: 20240808
Lead Channel Impedance Value: 285 Ohm
Lead Channel Impedance Value: 342 Ohm
Lead Channel Impedance Value: 380 Ohm
Lead Channel Impedance Value: 551 Ohm
Lead Channel Pacing Threshold Amplitude: 0.5 V
Lead Channel Pacing Threshold Amplitude: 1.5 V
Lead Channel Pacing Threshold Pulse Width: 0.4 ms
Lead Channel Pacing Threshold Pulse Width: 0.4 ms
Lead Channel Sensing Intrinsic Amplitude: 17.75 mV
Lead Channel Sensing Intrinsic Amplitude: 17.75 mV
Lead Channel Sensing Intrinsic Amplitude: 3.75 mV
Lead Channel Sensing Intrinsic Amplitude: 3.75 mV
Lead Channel Setting Pacing Amplitude: 1.5 V
Lead Channel Setting Pacing Amplitude: 3 V
Lead Channel Setting Pacing Pulse Width: 0.4 ms
Lead Channel Setting Sensing Sensitivity: 1.2 mV
Zone Setting Status: 755011

## 2024-06-20 ENCOUNTER — Encounter: Payer: Self-pay | Admitting: Internal Medicine

## 2024-06-20 ENCOUNTER — Ambulatory Visit: Admitting: Internal Medicine

## 2024-06-20 ENCOUNTER — Telehealth: Payer: Self-pay | Admitting: Internal Medicine

## 2024-06-20 VITALS — BP 128/82 | HR 78 | Temp 97.6°F | Ht 66.0 in | Wt 142.8 lb

## 2024-06-20 DIAGNOSIS — M25561 Pain in right knee: Secondary | ICD-10-CM | POA: Diagnosis not present

## 2024-06-20 DIAGNOSIS — M25569 Pain in unspecified knee: Secondary | ICD-10-CM

## 2024-06-20 DIAGNOSIS — K50919 Crohn's disease, unspecified, with unspecified complications: Secondary | ICD-10-CM

## 2024-06-20 DIAGNOSIS — I441 Atrioventricular block, second degree: Secondary | ICD-10-CM

## 2024-06-20 DIAGNOSIS — I10 Essential (primary) hypertension: Secondary | ICD-10-CM

## 2024-06-20 DIAGNOSIS — M7989 Other specified soft tissue disorders: Secondary | ICD-10-CM | POA: Diagnosis not present

## 2024-06-20 DIAGNOSIS — E7800 Pure hypercholesterolemia, unspecified: Secondary | ICD-10-CM | POA: Diagnosis not present

## 2024-06-20 DIAGNOSIS — Z23 Encounter for immunization: Secondary | ICD-10-CM | POA: Diagnosis not present

## 2024-06-20 DIAGNOSIS — M25461 Effusion, right knee: Secondary | ICD-10-CM

## 2024-06-20 DIAGNOSIS — M25512 Pain in left shoulder: Secondary | ICD-10-CM

## 2024-06-20 DIAGNOSIS — E538 Deficiency of other specified B group vitamins: Secondary | ICD-10-CM | POA: Diagnosis not present

## 2024-06-20 DIAGNOSIS — D649 Anemia, unspecified: Secondary | ICD-10-CM

## 2024-06-20 DIAGNOSIS — Z95811 Presence of heart assist device: Secondary | ICD-10-CM

## 2024-06-20 DIAGNOSIS — M1990 Unspecified osteoarthritis, unspecified site: Secondary | ICD-10-CM

## 2024-06-20 MED ORDER — HYDROCHLOROTHIAZIDE 12.5 MG PO CAPS: 12.5000 mg | ORAL_CAPSULE | Freq: Every day | ORAL | 1 refills | Status: AC

## 2024-06-20 MED ORDER — SULFASALAZINE 500 MG PO TABS: 1500.0000 mg | ORAL_TABLET | Freq: Two times a day (BID) | ORAL | 4 refills | Status: DC

## 2024-06-20 NOTE — Telephone Encounter (Unsigned)
 Copied from CRM [id number]. Topic: Clinical - Medication Refill >> Jun 20, 2024  4:55 PM Hadassah PARAS wrote: Medication: Rosuvastatin  (pt does not know dosage, this med is not showing up on chart)  Has the patient contacted their pharmacy? No (Agent: If no, request that the patient contact the pharmacy for the refill. If patient does not wish to contact the pharmacy document the reason why and proceed with request.) (Agent: If yes, when and what did the pharmacy advise?)  This is the patient's preferred pharmacy:  [id number] - [place], [place] - [address] [place] [address] Phone: [phone] Fax: [phone]    Is this the correct pharmacy for this prescription? Yes If no, delete pharmacy and type the correct one.   Has the prescription been filled recently? Yes  Is the patient out of the medication? Yes  Has the patient been seen for an appointment in the last year OR does the patient have an upcoming appointment? Yes  Can we respond through MyChart? No  Agent: Please be advised that Rx refills may take up to 3 business days. We ask that you follow-up with your pharmacy.

## 2024-06-20 NOTE — Patient Instructions (Signed)
 Rosuvastatin  (crestor ) - cholesterol medication. One per day.

## 2024-06-20 NOTE — Progress Notes (Signed)
 Subjective:    Patient ID: Ellen Drake, female    DOB: [birth year], 78 y.o.   MRN: [id number]  Patient here for  Chief Complaint  Patient presents with   Medical Management of Chronic Issues    HPI Here for a scheduled follow up - follow up regarding hypertension, Crohn's and hypercholesterolemia. Saw vascular 01/18/24 - recommended compression hose - leg swelling. Covid 03/03/24. Treated with molnupiravir . Had persistent increased cough. Reevaluated 03/09/24 - treated with prednisone , flonase  and cough syrup. CXR - no active cardiopulmonary disease. No residual problems reported today. No cough or congestion. No abdominal pain or bowel change. No diarrhea. Seeing Dr Mardee regarding her knee. Discussed surgery. Saw [facility] for her left shoulder. S/p injection and prednisone . Persistent limited rom.    Past Medical History:  Diagnosis Date   Anemia    reslved   Colon polyp 2009   Crohn's disease (HCC)    Diverticulosis    Fibrocystic breast disease    Grade I diastolic dysfunction    Herniated nucleus pulposus of lumbosacral region    History of chicken pox    Hypercholesterolemia    Hyperlipidemia    Hypertension    Mild aortic stenosis by prior echocardiogram    Mild mitral regurgitation by prior echocardiogram    Osteoarthritis    Pacemaker    Pulmonary nodule seen on imaging study 2004   resolved 2007   Second degree AV block, Mobitz type II    Past Surgical History:  Procedure Laterality Date   anterior cervical discectomy and fusion  1998   Dr Drury SANES SURGERY  1992   ruptured disc (Dr Kathi)   BREAST BIOPSY Left    neg-no scar seen   CATARACT EXTRACTION W/PHACO Right 02/09/2019   Procedure: CATARACT EXTRACTION PHACO AND INTRAOCULAR LENS PLACEMENT (IOC) KAHOOK DUAL BLADE GONIOTOMY  RIGHT HEALON 5, VISION BLUE;  Surgeon: Mittie Gaskin, MD;  Location: [facility];  Service: Ophthalmology;  Laterality: Right;   CATARACT EXTRACTION W/PHACO  Left 11/11/2023   Procedure: PHACOEMULSIFICATION, CATARACT, WITH IOL INSERTION 11.75 01:03.7;  Surgeon: Mittie Gaskin, MD;  Location: [facility];  Service: Ophthalmology;  Laterality: Left;   COLECTOMY  2003   COLON SURGERY  1998   COLONOSCOPY  2009   COLONOSCOPY W/ BIOPSIES  11/30/2012   Procedure: COLONOSCOPY W/BIOPSY; Surgeon: Elijah DELENA SHAUNNA Tanda, MD; Location: [facility]; Service: Gastroenterology;;   COLONOSCOPY W/ BIOPSIES  04/15/2016   Procedure: Colonoscopy ; Surgeon: Elijah DELENA SHAUNNA Tanda, MD; Location: [facility]; Service: Gastroenterology; Laterality: N/A;   KNEE ARTHROSCOPY W/ SYNOVECTOMY Right 03/10/2014   limited   KNEE SURGERY Right 1/16   [facility] (Dr. Durene)   PACEMAKER IMPLANT N/A 03/19/2023   Procedure: PACEMAKER IMPLANT;  Surgeon: Inocencio Soyla Lunger, MD;  Location: [facility];  Service: Cardiovascular;  Laterality: N/A;   POSTERIOR LAMINECTOMY / DECOMPRESSION LUMBAR SPINE  1999   REPLACEMENT TOTAL KNEE     ROTATOR CUFF REPAIR Left 2007   acute open; accident   Family History  Problem Relation Age of Onset   CVA Mother    Hypertension Mother    Hypertension Sister        brain tumor   Breast cancer Neg Hx    Colon cancer Neg Hx    Social History   Socioeconomic History   Marital status: Married    Spouse name: Not on file   Number of children: 0   Years  of education: 12   Highest education level: Not on file  Occupational History   Not on file  Tobacco Use   Smoking status: Former    Current packs/day: 0.00    Types: Cigarettes    Quit date: 09/10/2009    Years since quitting: 14.8   Smokeless tobacco: Never  Vaping Use   Vaping status: Never Used  Substance and Sexual Activity   Alcohol use: Yes    Alcohol/week: 1.0 standard drink of alcohol    Types: 1 Glasses of wine per week    Comment: wine occasional; socially   Drug use: No   Sexual activity: Yes  Other Topics Concern   Not on file  Social  History Narrative   married   Social Drivers of Corporate Investment Banker Strain: Low Risk  (10/15/2023)   Received from [facility]   Overall Financial Resource Strain (CARDIA)    Difficulty of Paying Living Expenses: Not hard at all  Food Insecurity: No Food Insecurity (10/15/2023)   Received from [facility]   Hunger Vital Sign    Within the past 12 months, you worried that your food would run out before you got the money to buy more.: Never true    Within the past 12 months, the food you bought just didn't last and you didn't have money to get more.: Never true  Transportation Needs: No Transportation Needs (10/15/2023)   Received from [facility] - Transportation    In the past 12 months, has lack of transportation kept you from medical appointments or from getting medications?: No    Lack of Transportation (Non-Medical): No  Physical Activity: Inactive (07/24/2023)   Exercise Vital Sign    Days of Exercise per Week: 0 days    Minutes of Exercise per Session: 0 min  Stress: No Stress Concern Present (07/24/2023)   [employer] of Occupational Health - Occupational Stress Questionnaire    Feeling of Stress : Not at all  Social Connections: Socially Integrated (07/24/2023)   Social Connection and Isolation Panel    Frequency of Communication with Friends and Family: More than three times a week    Frequency of Social Gatherings with Friends and Family: Twice a week    Attends Religious Services: More than 4 times per year    Active Member of [employer] or Organizations: Yes    Attends Engineer, Structural: More than 4 times per year    Marital Status: Married     Review of Systems  Constitutional:  Negative for appetite change and unexpected weight change.  HENT:  Negative for congestion and sinus pressure.   Respiratory:  Negative for cough, chest tightness and shortness of breath.   Cardiovascular:   Negative for chest pain and palpitations.  Gastrointestinal:  Negative for abdominal pain, diarrhea, nausea and vomiting.  Genitourinary:  Negative for difficulty urinating and dysuria.  Musculoskeletal:  Negative for myalgias.       Persistent knee issues. Seeing Dr Mardee as outlined.   Skin:  Negative for color change and rash.  Neurological:  Negative for dizziness and headaches.  Psychiatric/Behavioral:  Negative for agitation and dysphoric mood.        Objective:     BP 128/82   Pulse 78   Temp 97.6 F (36.4 C) (Oral)   Ht 5' 6 (1.676 m)   Wt 142 lb 12.8 oz (64.8 kg)   LMP 08/13/1995  SpO2 98%   BMI 23.05 kg/m  Wt Readings from Last 3 Encounters:  06/20/24 142 lb 12.8 oz (64.8 kg)  03/09/24 140 lb 12.8 oz (63.9 kg)  03/03/24 143 lb (64.9 kg)    Physical Exam Vitals reviewed.  Constitutional:      General: She is not in acute distress.    Appearance: Normal appearance.  HENT:     Head: Normocephalic and atraumatic.     Right Ear: External ear normal.     Left Ear: External ear normal.     Mouth/Throat:     Pharynx: No oropharyngeal exudate or posterior oropharyngeal erythema.  Eyes:     General: No scleral icterus.       Right eye: No discharge.        Left eye: No discharge.     Conjunctiva/sclera: Conjunctivae normal.  Neck:     Thyroid : No thyromegaly.  Cardiovascular:     Rate and Rhythm: Normal rate and regular rhythm.  Pulmonary:     Effort: No respiratory distress.     Breath sounds: Normal breath sounds. No wheezing.  Abdominal:     General: Bowel sounds are normal.     Palpations: Abdomen is soft.     Tenderness: There is no abdominal tenderness.  Musculoskeletal:        General: No tenderness.     Cervical back: Neck supple. No tenderness.     Comments: Persistnet right knee - swelling. Unchanged.   Lymphadenopathy:     Cervical: No cervical adenopathy.  Skin:    Findings: No erythema or rash.  Neurological:     Mental Status: She is  alert.  Psychiatric:        Mood and Affect: Mood normal.        Behavior: Behavior normal.         Outpatient Encounter Medications as of 06/20/2024  Medication Sig   amLODipine  (NORVASC ) 10 MG tablet Take 1 tablet (10 mg total) by mouth daily.   ASPIRIN 81 PO Take 1 tablet by mouth daily.   carvedilol  (COREG ) 12.5 MG tablet Take 1 tablet (12.5 mg total) by mouth 2 (two) times daily.   cyanocobalamin  1000 MCG tablet Take 1,000 mcg by mouth daily.   fluticasone  (FLONASE ) 50 MCG/ACT nasal spray Place 2 sprays into both nostrils daily for 14 days.   folic acid (FOLVITE) 1 MG tablet Take 1 mg by mouth daily.   guaiFENesin -codeine  100-10 MG/5ML syrup Take 5 mLs by mouth 3 (three) times daily as needed for cough.   latanoprost  (XALATAN ) 0.005 % ophthalmic solution Place 1 drop into both eyes at bedtime.   MAGNESIUM  GLYCINATE PO Take 200 mg by mouth 2 (two) times daily.   potassium chloride  (KLOR-CON  M) 10 MEQ tablet TAKE 2 TABLETS BY MOUTH TWICE A DAY   timolol  (TIMOPTIC ) 0.5 % ophthalmic solution Place 1 drop into both eyes 2 (two) times daily.   traMADol  (ULTRAM ) 50 MG tablet Take 1 tablet (50 mg total) by mouth 2 (two) times daily. Each refill must last 30 days.   triamcinolone  cream (KENALOG ) 0.1 % Apply 1 Application topically as needed (prn). As needed   [DISCONTINUED] sulfaSALAzine  (AZULFIDINE ) 500 MG tablet Take 1,500 mg by mouth 2 (two) times daily. 3 tabs   hydrochlorothiazide  (MICROZIDE ) 12.5 MG capsule Take 1 capsule (12.5 mg total) by mouth daily.   sulfaSALAzine  (AZULFIDINE ) 500 MG tablet Take 3 tablets (1,500 mg total) by mouth 2 (two) times daily. 3 tabs   [DISCONTINUED]  hydrochlorothiazide  (MICROZIDE ) 12.5 MG capsule TAKE 1 CAPSULE BY MOUTH EVERY DAY   No facility-administered encounter medications on file as of 06/20/2024.     Lab Results  Component Value Date   WBC 6.8 06/14/2024   HGB 12.3 06/14/2024   HCT 36.6 06/14/2024   PLT 168.0 06/14/2024   GLUCOSE 85  06/14/2024   CHOL 264 (H) 06/14/2024   TRIG 89.0 06/14/2024   HDL 72.60 06/14/2024   LDLDIRECT 132.5 05/06/2013   LDLCALC 173 (H) 06/14/2024   ALT 10 06/14/2024   AST 20 06/14/2024   NA 141 06/14/2024   K 4.1 06/14/2024   CL 104 06/14/2024   CREATININE 0.84 06/14/2024   BUN 17 06/14/2024   CO2 30 06/14/2024   TSH 2.55 02/15/2024   HGBA1C 5.7 10/19/2012    MM 3D SCREENING MAMMOGRAM BILATERAL BREAST Result Date: 01/22/2024 CLINICAL DATA:  Screening. EXAM: DIGITAL SCREENING BILATERAL MAMMOGRAM WITH TOMOSYNTHESIS AND CAD TECHNIQUE: Bilateral screening digital craniocaudal and mediolateral oblique mammograms were obtained. Bilateral screening digital breast tomosynthesis was performed. The images were evaluated with computer-aided detection. COMPARISON:  Previous exam(s). ACR Breast Density Category b: There are scattered areas of fibroglandular density. FINDINGS: There are no findings suspicious for malignancy. Limited visualization of the left axilla due to limited range of motion in the left shoulder. IMPRESSION: No mammographic evidence of malignancy. A result letter of this screening mammogram will be mailed directly to the patient. RECOMMENDATION: Screening mammogram in one year. (Code:SM-B-01Y) BI-RADS CATEGORY  1: Negative. Electronically Signed   By: Inocente Ast M.D.   On: 01/22/2024 12:50       Assessment & Plan:  Need for influenza vaccination -     Flu vaccine HIGH DOSE PF(Fluzone Trivalent)  Hypercholesterolemia Assessment & Plan: Continue crestor . Follow lipid panel and liver function tests.   Orders: -     Lipid panel; Future -     Hepatic function panel; Future  Primary hypertension Assessment & Plan: Continues carvedilol , hydrochlorothiazide . Per note, taking amlodipine . Blood pressure as outlined. Follow pressures.  Follow metabolic panel. No change in medication today.   Orders: -     Basic metabolic panel with GFR; Future  Knee pain, unspecified  chronicity, unspecified laterality  Inflammation of joint  Presence of heart assist device [facility]) Assessment & Plan: S/p pacemaker placement 03/2023 - stable. Continue f/u with cardiology.    Crohn's disease with complication, unspecified gastrointestinal tract location [facility]) Assessment & Plan: Bowels stable. Doing well. Has been followed by GI ([facility]). Continue sulfasalazine . Continue f/u with GI.    Vitamin B12 deficiency Assessment & Plan: Continue B12 supplementation.    Second degree AV block, Mobitz type II Assessment & Plan: S/p pacemaker placement - 03/2023. Continue f/u with cardiology.    Right leg swelling Assessment & Plan: Has had chronic swelling in her right knee. Saw vascular 01/18/24 - recommended compression hose - leg swelling.    Pain and swelling of knee (Right) Assessment & Plan: Has had several surgeries on her knee.  Has been followed by pain clinic.  Now seeing Dr Mardee as outlined.    Left shoulder pain, unspecified chronicity Assessment & Plan: S/p injection. Helped. Persistent limited rom.    Anemia, unspecified type Assessment & Plan: Hgb wnl 06/14/24 lab.    Other orders -     hydroCHLOROthiazide ; Take 1 capsule (12.5 mg total) by mouth daily.  Dispense: 90 capsule; Refill: 1 -     sulfaSALAzine ; Take 3 tablets (1,500 mg total) by mouth  2 (two) times daily. 3 tabs  Dispense: 180 tablet; Refill: 4     Allena Hamilton, MD

## 2024-06-21 ENCOUNTER — Ambulatory Visit: Payer: Self-pay | Admitting: Cardiology

## 2024-06-21 NOTE — Progress Notes (Signed)
 Remote PPM Transmission

## 2024-06-21 NOTE — Telephone Encounter (Signed)
 We had discussed restarting a cholesterol medication given that her cholesterol was increased.  In reviewing her chart, she had reported she thought she had leg cramps with crestor . Is she ok to retry or would she like for me to try a different cholesterol medication. If wants to try a different medication, then can send in pravastatin 10mg  q day.

## 2024-06-22 MED ORDER — ROSUVASTATIN CALCIUM 5 MG PO TABS: 5.0000 mg | ORAL_TABLET | Freq: Every day | ORAL | 2 refills | Status: DC

## 2024-06-22 NOTE — Telephone Encounter (Signed)
 Please notify her that I have sent in the prescription for crestor  5mg  (low dose). She will take one per day. Let us  know if any problems.

## 2024-06-22 NOTE — Telephone Encounter (Signed)
 Spoke to pt informed her that rx was sent in to pharmacy. Pt verbalized understanding of directions

## 2024-06-26 ENCOUNTER — Encounter: Payer: Self-pay | Admitting: Internal Medicine

## 2024-06-26 DIAGNOSIS — Z95811 Presence of heart assist device: Secondary | ICD-10-CM | POA: Insufficient documentation

## 2024-06-26 NOTE — Assessment & Plan Note (Signed)
 Continues carvedilol , hydrochlorothiazide . Per note, taking amlodipine . Blood pressure as outlined. Follow pressures.  Follow metabolic panel. No change in medication today.

## 2024-06-26 NOTE — Assessment & Plan Note (Signed)
 Has had chronic swelling in her right knee. Saw vascular 01/18/24 - recommended compression hose - leg swelling.

## 2024-06-26 NOTE — Assessment & Plan Note (Signed)
-  Continue B12 supplementation °

## 2024-06-26 NOTE — Assessment & Plan Note (Signed)
 S/p injection. Helped. Persistent limited rom.

## 2024-06-26 NOTE — Assessment & Plan Note (Signed)
 Hgb wnl 06/14/24 lab.

## 2024-06-26 NOTE — Assessment & Plan Note (Signed)
 S/p pacemaker placement - 03/2023. Continue f/u with cardiology.

## 2024-06-26 NOTE — Assessment & Plan Note (Signed)
 Bowels stable. Doing well. Has been followed by GI ([facility]). Continue sulfasalazine . Continue f/u with GI.

## 2024-06-26 NOTE — Assessment & Plan Note (Signed)
 Has had several surgeries on her knee.  Has been followed by pain clinic.  Now seeing Dr Mardee as outlined.

## 2024-06-26 NOTE — Assessment & Plan Note (Signed)
 Continue crestor. Follow lipid panel and liver function tests.

## 2024-06-26 NOTE — Assessment & Plan Note (Signed)
 S/p pacemaker placement 03/2023 - stable. Continue f/u with cardiology.

## 2024-06-30 ENCOUNTER — Ambulatory Visit: Payer: Self-pay

## 2024-06-30 NOTE — Telephone Encounter (Signed)
 FYI Only or Action Required?: FYI only for provider: stopped taking Rosuvastatin .  Patient was last seen in primary care on 06/20/2024 by Glendia Shad, MD.  Called Nurse Triage reporting leg cramps.  Symptoms began several weeks ago.  Interventions attempted: Nothing.  Symptoms are: stable.  Triage Disposition: Home Care  Patient/caregiver understands and will follow disposition?: Yes Reason for Disposition  [1] Caused by muscle cramps in the thigh, calf, or foot AND [2] present < 1 hour (brief, now gone)  Answer Assessment - Initial Assessment Questions Patient started taking Rosuvastatin  5mg  again about 2 weeks ago. Was told to call back if leg cramping came back again. Patient stated she did not take her dose this morning. Please advise.   1. ONSET: When did the pain start?      After taking Rosuvastatin   2. LOCATION: Where is the pain located?      Both legs and feet  3. CAUSE: What do you think is causing the leg pain?     Rosuvastatin    4. OTHER SYMPTOMS: Do you have any other symptoms? (e.g., chest pain, back pain, breathing difficulty, swelling, rash, fever, numbness, weakness)     Trouble sleeping due to the leg cramps.  Protocols used: Leg Pain-A-AH  Copied from CRM [id number]. Topic: Clinical - Medication Question >> Jun 30, 2024  3:46 PM Viola F wrote: Reason for CRM: Patient says the rosuvastatin  (CRESTOR ) 5 MG tablet medication is making her legs cramp and causing her to not be able to sleep. Please call her at [phone] (H)

## 2024-07-01 NOTE — Telephone Encounter (Signed)
 Pt notified. Pt is in agreement with plan. And will reach back out if symptoms get worse or reoccur after stopping meds

## 2024-07-01 NOTE — Telephone Encounter (Signed)
 Agree with stopping crestor  (rosuvastatin ). Stay hydrated. Keep us  posted and let me know if any other acute issues that needs to be addressed.

## 2024-07-04 ENCOUNTER — Telehealth: Payer: Self-pay | Admitting: Cardiology

## 2024-07-04 NOTE — Telephone Encounter (Signed)
 Patient reports acute redness, warmth and swelling at device site X 2 weeks.  Significant pain and decreased ROM to Left Shoulder. May 2025 - given abx to treat by Suzann Riddle, NP. See OV notes from Suzann for May and June.   Patient to come to device clinic tomorrow for evaluation.  She cannot send a picture.

## 2024-07-04 NOTE — Telephone Encounter (Signed)
   1. Has your device fired? No   2. Is you device beeping? No   3. Are you experiencing draining or swelling at device site? Yes, swelling and red color at the device site. Also, she said pain is up to her shoulder   4. Are you calling to see if we received your device transmission? No   5. Have you passed out? No    Please route to Device Clinic Pool

## 2024-07-05 ENCOUNTER — Ambulatory Visit

## 2024-07-05 DIAGNOSIS — I441 Atrioventricular block, second degree: Secondary | ICD-10-CM

## 2024-07-05 NOTE — Patient Instructions (Signed)
 Follow up as scheduled.

## 2024-07-05 NOTE — Progress Notes (Signed)
 Patient brought in acutely to assess device site d/t complaint of redness, warmth, swelling, and pain. Device site well-healed w/ no signs of redness or swelling. Patient w/ hx of left shoulder pain. Reassured patient there are no signs or symptoms of infections at this time. Advised patient to F/U w/ Ortho provider regarding pain in left shoulder.   Informed to call device clinic for any further questions or concerns. Will continue to monitor and update accordingly.

## 2024-07-11 ENCOUNTER — Other Ambulatory Visit: Payer: Self-pay | Admitting: Nurse Practitioner

## 2024-07-11 DIAGNOSIS — G894 Chronic pain syndrome: Secondary | ICD-10-CM

## 2024-07-11 DIAGNOSIS — G8929 Other chronic pain: Secondary | ICD-10-CM

## 2024-07-11 DIAGNOSIS — Z79891 Long term (current) use of opiate analgesic: Secondary | ICD-10-CM

## 2024-07-11 DIAGNOSIS — Z79899 Other long term (current) drug therapy: Secondary | ICD-10-CM

## 2024-07-23 ENCOUNTER — Other Ambulatory Visit: Payer: Self-pay | Admitting: Internal Medicine

## 2024-07-26 ENCOUNTER — Ambulatory Visit: Payer: Medicare HMO

## 2024-07-26 VITALS — Ht 66.0 in | Wt 142.0 lb

## 2024-07-26 DIAGNOSIS — Z0000 Encounter for general adult medical examination without abnormal findings: Secondary | ICD-10-CM

## 2024-07-26 NOTE — Patient Instructions (Signed)
 [name],  Thank you for taking the time for your Medicare Wellness Visit. I appreciate your continued commitment to your health goals. Please review the care plan we discussed, and feel free to reach out if I can assist you further.  Please note that Annual Wellness Visits do not include a physical exam. Some assessments may be limited, especially if the visit was conducted virtually. If needed, we may recommend an in-person follow-up with your provider.  Ongoing Care Seeing your primary care provider every 3 to 6 months helps us  monitor your health and provide consistent, personalized care.  Consider updating your shingles and covid vaccines.   Managing Pain Without Opioids Opioids are strong medicines used to treat moderate to severe pain. For some people, especially those who have long-term (chronic) pain, opioids may not be the best choice for pain management due to: Side effects like nausea, constipation, and sleepiness. The risk of addiction (opioid use disorder). The longer you take opioids, the greater your risk of addiction. Pain that lasts for more than 3 months is called chronic pain. Managing chronic pain usually requires more than one approach and is often provided by a team of health care providers working together (multidisciplinary approach). Pain management may be done at a pain management center or pain clinic. How to manage pain without the use of opioids Use non-opioid medicines Non-opioid medicines for pain may include: Over-the-counter or prescription non-steroidal anti-inflammatory drugs (NSAIDs). These may be the first medicines used for pain. They work well for muscle and bone pain, and they reduce swelling. Acetaminophen . This over-the-counter medicine may work well for milder pain but not swelling. Antidepressants. These may be used to treat chronic pain. A certain type of antidepressant (tricyclics) is often used. These medicines are given in lower doses for pain  than when used for depression. Anticonvulsants. These are usually used to treat seizures but may also reduce nerve (neuropathic) pain. Muscle relaxants. These relieve pain caused by sudden muscle tightening (spasms). You may also use a pain medicine that is applied to the skin as a patch, cream, or gel (topical analgesic), such as a numbing medicine. These may cause fewer side effects than medicines taken by mouth. Do certain therapies as directed Some therapies can help with pain management. They include: Physical therapy. You will do exercises to gain strength and flexibility. A physical therapist may teach you exercises to move and stretch parts of your body that are weak, stiff, or painful. You can learn these exercises at physical therapy visits and practice them at home. Physical therapy may also involve: Massage. Heat wraps or applying heat or cold to affected areas. Electrical signals that interrupt pain signals (transcutaneous electrical nerve stimulation, TENS). Weak lasers that reduce pain and swelling (low-level laser therapy). Signals from your body that help you learn to regulate pain (biofeedback). Occupational therapy. This helps you to learn ways to function at home and work with less pain. Recreational therapy. This involves trying new activities or hobbies, such as a physical activity or drawing. Mental health therapy, including: Cognitive behavioral therapy (CBT). This helps you learn coping skills for dealing with pain. Acceptance and commitment therapy (ACT) to change the way you think and react to pain. Relaxation therapies, including muscle relaxation exercises and mindfulness-based stress reduction. Pain management counseling. This may be individual, family, or group counseling.  Receive medical treatments Medical treatments for pain management include: Nerve block injections. These may include a pain blocker and anti-inflammatory medicines. You may have  injections:  Near the spine to relieve chronic back or neck pain. Into joints to relieve back or joint pain. Into nerve areas that supply a painful area to relieve body pain. Into muscles (trigger point injections) to relieve some painful muscle conditions. A medical device placed near your spine to help block pain signals and relieve nerve pain or chronic back pain (spinal cord stimulation device). Acupuncture. Follow these instructions at home Medicines Take over-the-counter and prescription medicines only as told by your health care provider. If you are taking pain medicine, ask your health care providers about possible side effects to watch out for. Do not drive or use heavy machinery while taking prescription opioid pain medicine. Lifestyle  Do not use drugs or alcohol to reduce pain. If you drink alcohol, limit how much you have to: 0-1 drink a day for women who are not pregnant. 0-2 drinks a day for men. Know how much alcohol is in a drink. In the [place] one drink equals one 12 oz bottle of beer (355 mL), one 5 oz glass of wine (148 mL), or one 1 oz glass of hard liquor (44 mL). Do not use any products that contain nicotine or tobacco. These products include cigarettes, chewing tobacco, and vaping devices, such as e-cigarettes. If you need help quitting, ask your health care provider. Eat a healthy diet and maintain a healthy weight. Poor diet and excess weight may make pain worse. Eat foods that are high in fiber. These include fresh fruits and vegetables, whole grains, and beans. Limit foods that are high in fat and processed sugars, such as fried and sweet foods. Exercise regularly. Exercise lowers stress and may help relieve pain. Ask your health care provider what activities and exercises are safe for you. If your health care provider approves, join an exercise class that combines movement and stress reduction. Examples include yoga and tai chi. Get enough sleep. Lack of sleep may  make pain worse. Lower stress as much as possible. Practice stress reduction techniques as told by your therapist. General instructions Work with all your pain management providers to find the treatments that work best for you. You are an important member of your pain management team. There are many things you can do to reduce pain on your own. Consider joining an online or in-person support group for people who have chronic pain. Keep all follow-up visits. This is important. Where to find more information You can find more information about managing pain without opioids from: American Academy of Pain Medicine: painmed.org Institute for Chronic Pain: instituteforchronicpain.org American Chronic Pain Association: theacpa.org Contact a health care provider if: You have side effects from pain medicine. Your pain gets worse or does not get better with treatments or home therapy. You are struggling with anxiety or depression. Summary Many types of pain can be managed without opioids. Chronic pain may respond better to pain management without opioids. Pain is best managed when you and a team of health care providers work together. Pain management without opioids may include non-opioid medicines, medical treatments, physical therapy, mental health therapy, and lifestyle changes. Tell your health care providers if your pain gets worse or is not being managed well enough. This information is not intended to replace advice given to you by your health care provider. Make sure you discuss any questions you have with your health care provider. Document Revised: 11/07/2020 Document Reviewed: 11/07/2020 Elsevier Patient Education  2024 Elsevier Inc.  Referrals If a referral was made during today's visit and you  haven't received any updates within two weeks, please contact the referred provider directly to check on the status.  Recommended Screenings:  Health Maintenance  Topic Date Due   Zoster  (Shingles) Vaccine (1 of 2) Never done   COVID-19 Vaccine (5 - 2025-26 season) 04/11/2024   Breast Cancer Screening  01/19/2025   Medicare Annual Wellness Visit  07/26/2025   DTaP/Tdap/Td vaccine (2 - Td or Tdap) 08/20/2032   Pneumococcal Vaccine for age over 83  Completed   Flu Shot  Completed   Osteoporosis screening with Bone Density Scan  Completed   Hepatitis C Screening  Completed   Meningitis B Vaccine  Aged Out   Colon Cancer Screening  Discontinued       07/26/2024   10:16 AM  Advanced Directives  Does Patient Have a Medical Advance Directive? No  Would patient like information on creating a medical advance directive? No - Patient declined    Vision: Annual vision screenings are recommended for early detection of glaucoma, cataracts, and diabetic retinopathy. These exams can also reveal signs of chronic conditions such as diabetes and high blood pressure.  Dental: Annual dental screenings help detect early signs of oral cancer, gum disease, and other conditions linked to overall health, including heart disease and diabetes.  Please see the attached documents for additional preventive care recommendations.

## 2024-07-26 NOTE — Progress Notes (Signed)
 Chief Complaint  Patient presents with   Medicare Wellness     Subjective:   Ellen Drake is a 78 y.o. female who presents for a Medicare Annual Wellness Visit.  Visit info / Clinical Intake: Medicare Wellness Visit Type:: Subsequent Annual Wellness Visit Persons participating in visit and providing information:: patient Medicare Wellness Visit Mode:: Telephone If telephone:: video declined Since this visit was completed virtually, some vitals may be partially provided or unavailable. Missing vitals are due to the limitations of the virtual format.: Unable to obtain vitals - no equipment If Telephone or Video please confirm:: I connected with patient using audio/video enable telemedicine. I verified patient identity with two identifiers, discussed telehealth limitations, and patient agreed to proceed. Patient Location:: Home Provider Location:: Office/Home Interpreter Needed?: No Pre-visit prep was completed: yes AWV questionnaire completed by patient prior to visit?: no Living arrangements:: lives with spouse/significant other Patient's Overall Health Status Rating: good Typical amount of pain: (!) a lot (knee pain for years) Does pain affect daily life?: no Are you currently prescribed opioids?: (!) yes  Dietary Habits and Nutritional Risks How many meals a day?: 2 Eats fruit and vegetables daily?: yes Most meals are obtained by: preparing own meals In the last 2 weeks, have you had any of the following?: none Diabetic:: no  Functional Status Activities of Daily Living (to include ambulation/medication): Independent Ambulation: Independent Medication Administration: Independent Home Management (perform basic housework or laundry): Independent Manage your own finances?: yes Primary transportation is: driving Concerns about vision?: no *vision screening is required for WTM* Concerns about hearing?: no  Fall Screening Falls in the past year?: 0 Number of falls in  past year: 0 Was there an injury with Fall?: 0 Fall Risk Category Calculator: 0 Patient Fall Risk Level: Low Fall Risk  Fall Risk Patient at Risk for Falls Due to: No Fall Risks Fall risk Follow up: Falls evaluation completed; Falls prevention discussed  Home and Transportation Safety: All rugs have non-skid backing?: N/A, no rugs All stairs or steps have railings?: yes Grab bars in the bathtub or shower?: yes Have non-skid surface in bathtub or shower?: yes Good home lighting?: yes Regular seat belt use?: yes Hospital stays in the last year:: no  Cognitive Assessment Difficulty concentrating, remembering, or making decisions? : no Will 6CIT or Mini Cog be Completed: yes What year is it?: 0 points What month is it?: 0 points Give patient an address phrase to remember (5 components): [address] [place] About what time is it?: 0 points Count backwards from 20 to 1: 0 points Say the months of the year in reverse: 0 points Repeat the address phrase from earlier: 2 points 6 CIT Score: 2 points  Advance Directives (For Healthcare) Does Patient Have a Medical Advance Directive?: No Would patient like information on creating a medical advance directive?: No - Patient declined  Reviewed/Updated  Reviewed/Updated: Reviewed All (Medical, Surgical, Family, Medications, Allergies, Care Teams, Patient Goals)    Allergies (verified) Mercaptopurine, Lyrica [pregabalin], and Dilaudid  [hydromorphone  hcl]   Current Medications (verified) Outpatient Encounter Medications as of 07/26/2024  Medication Sig   amLODipine  (NORVASC ) 10 MG tablet Take 1 tablet (10 mg total) by mouth daily.   ASPIRIN 81 PO Take 1 tablet by mouth daily.   carvedilol  (COREG ) 12.5 MG tablet Take 1 tablet (12.5 mg total) by mouth 2 (two) times daily.   cyanocobalamin  1000 MCG tablet Take 1,000 mcg by mouth daily.   fluticasone  (FLONASE ) 50 MCG/ACT nasal  spray Place 2 sprays into both nostrils daily for 14  days. (Patient taking differently: Place 2 sprays into both nostrils daily as needed.)   folic acid (FOLVITE) 1 MG tablet Take 1 mg by mouth daily.   guaiFENesin -codeine  100-10 MG/5ML syrup Take 5 mLs by mouth 3 (three) times daily as needed for cough.   hydrochlorothiazide  (MICROZIDE ) 12.5 MG capsule Take 1 capsule (12.5 mg total) by mouth daily.   latanoprost  (XALATAN ) 0.005 % ophthalmic solution Place 1 drop into both eyes at bedtime.   MAGNESIUM  GLYCINATE PO Take 200 mg by mouth 2 (two) times daily.   potassium chloride  (KLOR-CON  M) 10 MEQ tablet TAKE 2 TABLETS BY MOUTH TWICE A DAY   rosuvastatin  (CRESTOR ) 5 MG tablet Take 1 tablet (5 mg total) by mouth daily.   sulfaSALAzine  (AZULFIDINE ) 500 MG tablet Take 3 tablets (1,500 mg total) by mouth 2 (two) times daily. 3 tabs   timolol  (TIMOPTIC ) 0.5 % ophthalmic solution Place 1 drop into both eyes 2 (two) times daily.   traMADol  (ULTRAM ) 50 MG tablet Take 1 tablet (50 mg total) by mouth 2 (two) times daily. Each refill must last 30 days.   triamcinolone  cream (KENALOG ) 0.1 % Apply 1 Application topically as needed (prn). As needed   No facility-administered encounter medications on file as of 07/26/2024.    History: Past Medical History:  Diagnosis Date   Anemia    reslved   Colon polyp 2009   Crohn's disease (HCC)    Diverticulosis    Fibrocystic breast disease    Grade I diastolic dysfunction    Herniated nucleus pulposus of lumbosacral region    History of chicken pox    Hypercholesterolemia    Hyperlipidemia    Hypertension    Mild aortic stenosis by prior echocardiogram    Mild mitral regurgitation by prior echocardiogram    Osteoarthritis    Pacemaker    Pulmonary nodule seen on imaging study 2004   resolved 2007   Second degree AV block, Mobitz type II    Past Surgical History:  Procedure Laterality Date   anterior cervical discectomy and fusion  1998   Dr Drury SANES SURGERY  1992   ruptured disc (Dr Kathi)    BREAST BIOPSY Left    neg-no scar seen   CATARACT EXTRACTION W/PHACO Right 02/09/2019   Procedure: CATARACT EXTRACTION PHACO AND INTRAOCULAR LENS PLACEMENT (IOC) KAHOOK DUAL BLADE GONIOTOMY  RIGHT HEALON 5, VISION BLUE;  Surgeon: Mittie Gaskin, MD;  Location: [facility];  Service: Ophthalmology;  Laterality: Right;   CATARACT EXTRACTION W/PHACO Left 11/11/2023   Procedure: PHACOEMULSIFICATION, CATARACT, WITH IOL INSERTION 11.75 01:03.7;  Surgeon: Mittie Gaskin, MD;  Location: [facility];  Service: Ophthalmology;  Laterality: Left;   COLECTOMY  2003   COLON SURGERY  1998   COLONOSCOPY  2009   COLONOSCOPY W/ BIOPSIES  11/30/2012   Procedure: COLONOSCOPY W/BIOPSY; Surgeon: Elijah DELENA SHAUNNA Tanda, MD; Location: [facility]; Service: Gastroenterology;;   COLONOSCOPY W/ BIOPSIES  04/15/2016   Procedure: Colonoscopy ; Surgeon: Elijah DELENA SHAUNNA Tanda, MD; Location: [facility]; Service: Gastroenterology; Laterality: N/A;   KNEE ARTHROSCOPY W/ SYNOVECTOMY Right 03/10/2014   limited   KNEE SURGERY Right 1/16   [facility] (Dr. Durene)   PACEMAKER IMPLANT N/A 03/19/2023   Procedure: PACEMAKER IMPLANT;  Surgeon: Inocencio Soyla Lunger, MD;  Location: [facility];  Service: Cardiovascular;  Laterality: N/A;   POSTERIOR LAMINECTOMY / DECOMPRESSION LUMBAR SPINE  1999  REPLACEMENT TOTAL KNEE     ROTATOR CUFF REPAIR Left 2007   acute open; accident   Family History  Problem Relation Age of Onset   CVA Mother    Hypertension Mother    Hypertension Sister        brain tumor   Breast cancer Neg Hx    Colon cancer Neg Hx    Social History   Occupational History   Not on file  Tobacco Use   Smoking status: Former    Current packs/day: 0.00    Types: Cigarettes    Quit date: 09/10/2009    Years since quitting: 14.8   Smokeless tobacco: Never  Vaping Use   Vaping status: Never Used  Substance and Sexual Activity   Alcohol use: Yes    Alcohol/week:  1.0 standard drink of alcohol    Types: 1 Glasses of wine per week    Comment: wine occasional; socially   Drug use: No   Sexual activity: Yes   Tobacco Counseling Counseling given: Not Answered  SDOH Screenings   Food Insecurity: No Food Insecurity (07/26/2024)  Housing: Low Risk (07/26/2024)  Transportation Needs: No Transportation Needs (07/26/2024)  Utilities: Not At Risk (07/26/2024)  Alcohol Screen: Low Risk (07/26/2024)  Depression (PHQ2-9): Low Risk (07/26/2024)  Financial Resource Strain: Low Risk (07/26/2024)  Physical Activity: Insufficiently Active (07/26/2024)  Social Connections: Moderately Integrated (07/26/2024)  Stress: No Stress Concern Present (07/26/2024)  Tobacco Use: Medium Risk (07/26/2024)  Health Literacy: Adequate Health Literacy (07/26/2024)   See flowsheets for full screening details  Depression Screen PHQ 2 & 9 Depression Scale- Over the past 2 weeks, how often have you been bothered by any of the following problems? Little interest or pleasure in doing things: 0 Feeling down, depressed, or hopeless (PHQ Adolescent also includes...irritable): 0 PHQ-2 Total Score: 0 Trouble falling or staying asleep, or sleeping too much: 0 Feeling tired or having little energy: 0 Poor appetite or overeating (PHQ Adolescent also includes...weight loss): 0 Feeling bad about yourself - or that you are a failure or have let yourself or your family down: 0 Trouble concentrating on things, such as reading the newspaper or watching television (PHQ Adolescent also includes...like school work): 0 Moving or speaking so slowly that other people could have noticed. Or the opposite - being so fidgety or restless that you have been moving around a lot more than usual: 0 Thoughts that you would be better off dead, or of hurting yourself in some way: 0 PHQ-9 Total Score: 0 If you checked off any problems, how difficult have these problems made it for you to do your work, take care  of things at home, or get along with other people?: Not difficult at all     Goals Addressed             This Visit's Progress    Patient Stated       Plans on knee surgery next year             Objective:    Today's Vitals   07/26/24 1011  Weight: 142 lb (64.4 kg)  Height: 5' 6 (1.676 m)   Body mass index is 22.92 kg/m.  Hearing/Vision screen Hearing Screening - Comments:: No issues Vision Screening - Comments:: Glasses, [facility], up to date Immunizations and Health Maintenance Health Maintenance  Topic Date Due   Zoster Vaccines- Shingrix (1 of 2) Never done   COVID-19 Vaccine (5 - 2025-26 season) 04/11/2024   Mammogram  01/19/2025   Medicare Annual Wellness (AWV)  07/26/2025   DTaP/Tdap/Td (2 - Td or Tdap) 08/20/2032   Pneumococcal Vaccine: 50+ Years  Completed   Influenza Vaccine  Completed   Bone Density Scan  Completed   Hepatitis C Screening  Completed   Meningococcal B Vaccine  Aged Out   Colonoscopy  Discontinued        Assessment/Plan:  This is a routine wellness examination for Semya.  Patient Care Team: Glendia Shad, MD as PCP - General (Internal Medicine) Lonni Slain, MD as PCP - Cardiology (Cardiology) Inocencio Soyla Lunger, MD as PCP - Electrophysiology (Cardiology)  I have personally reviewed and noted the following in the patients chart:   Medical and social history Use of alcohol, tobacco or illicit drugs  Current medications and supplements including opioid prescriptions. Functional ability and status Nutritional status Physical activity Advanced directives List of other physicians Hospitalizations, surgeries, and ER visits in previous 12 months Vitals Screenings to include cognitive, depression, and falls Referrals and appointments  No orders of the defined types were placed in this encounter.  In addition, I have reviewed and discussed with patient certain preventive protocols, quality metrics, and best  practice recommendations. A written personalized care plan for preventive services as well as general preventive health recommendations were provided to patient.   Angeline Fredericks, LPN   [id number]   Return in 1 year (on 07/26/2025).  After Visit Summary: (Pick Up) Due to this being a telephonic visit, with patients personalized plan was offered to patient and patient has requested to Pick up at office.  Nurse Notes: Patient declines covid and shingles vaccines at this time.

## 2024-07-27 ENCOUNTER — Ambulatory Visit: Attending: Nurse Practitioner | Admitting: Nurse Practitioner

## 2024-07-27 ENCOUNTER — Encounter: Payer: Self-pay | Admitting: Nurse Practitioner

## 2024-07-27 DIAGNOSIS — M25561 Pain in right knee: Secondary | ICD-10-CM | POA: Diagnosis present

## 2024-07-27 DIAGNOSIS — G8929 Other chronic pain: Secondary | ICD-10-CM | POA: Diagnosis present

## 2024-07-27 DIAGNOSIS — Z96651 Presence of right artificial knee joint: Secondary | ICD-10-CM | POA: Diagnosis present

## 2024-07-27 DIAGNOSIS — Z79891 Long term (current) use of opiate analgesic: Secondary | ICD-10-CM | POA: Diagnosis present

## 2024-07-27 DIAGNOSIS — Z79899 Other long term (current) drug therapy: Secondary | ICD-10-CM | POA: Diagnosis present

## 2024-07-27 DIAGNOSIS — G894 Chronic pain syndrome: Secondary | ICD-10-CM | POA: Diagnosis present

## 2024-07-27 MED ORDER — TRAMADOL HCL 50 MG PO TABS: 50.0000 mg | ORAL_TABLET | Freq: Two times a day (BID) | ORAL | 5 refills | Status: DC

## 2024-07-27 NOTE — Telephone Encounter (Signed)
 Potassium just checked in 06/2024 and wnl. Please confirm with her the dose of potassium she is taking. Just need to clarify dose before refill.

## 2024-07-27 NOTE — Progress Notes (Signed)
 PROVIDER NOTE: Interpretation of information contained herein should be left to medically-trained personnel. Specific patient instructions are provided elsewhere under Patient Instructions section of medical record. This document was created in part using AI and STT-dictation technology, any transcriptional errors that may result from this process are unintentional.  Patient: Ellen Drake  Service: E/M   PCP: Glendia Shad, MD  DOB: Mar 05, 1946  DOS: 07/27/2024  Provider: Emmy MARLA Blanch, NP  MRN: [id number]  Delivery: Face-to-face  Specialty: Interventional Pain Management  Type: Established Patient  Setting: Ambulatory outpatient facility  Specialty designation: 09  Referring Prov.: Glendia Shad, MD  Location: Outpatient office facility       History of present illness (HPI) [name], a 78 y.o. year old female, is here today because of her right knee pain. [name] primary complain today is Knee Pain  Pertinent problems: [name] has Hypertension; Chronic knee pain (1ry area of Pain) (Right); S/P revision of total knee (Right); Primary osteoarthritis of right knee; Crohn's colitis (HCC); Disorder of the skin and subcutaneous tissue, unspecified; History of unicompartmental knee replacement (Right); Therapeutic drug monitoring; Chronic pain syndrome; Pharmacologic therapy; Disorder of skeletal system; Problems influencing health status; Chronic knee pain after total replacement of right knee joint; Pain and swelling of knee (Right); Neurogenic pain; Chronic use of opiate for therapeutic purpose; and Chronic pain disorder on their pertinent problem list.  Pain Assessment: Severity of Chronic pain is reported as a 10-Worst pain ever/10. Location: Knee Right/Denies. Onset: More than a month ago. Quality: Throbbing. Timing: Constant. Modifying factor(s): Pain medication. Vitals:  height is 5' 5.5 (1.664 m) and weight is 142 lb (64.4 kg). Her temperature is 97.2 F (36.2 C)  (abnormal). Her blood pressure is 152/82 (abnormal) and her pulse is 79. Her respiration is 18 and oxygen saturation is 99%.  BMI: Estimated body mass index is 23.27 kg/m as calculated from the following:   Height as of this encounter: 5' 5.5 (1.664 m).   Weight as of this encounter: 142 lb (64.4 kg).  Last encounter: 07/11/2024. Last procedure: 07/11/2024.  Reason for encounter: medication management.  The patient indicates doing well with current medication regimen.  No side effects or adverse reaction reported to medication.  Discussed the use of AI scribe software for clinical note transcription with the patient, who gave verbal consent to proceed.  History of Present Illness   Ellen Drake is a 78 year old female who presents with right knee pain.  She experiences persistent pain in her right knee, which has been a longstanding issue despite undergoing three knee replacement surgeries on the same knee. Another surgery is planned for next year. She reports dissatisfaction with the outcomes of her previous knee surgeries, stating that her knee was 'messed up' after the procedures.  She manages her pain with tramadol , taking it twice daily. No side effects or adverse reactions from the medication. Her prescriptions are filled at [facility] in [place].     Pharmacotherapy Assessment   Tramadol  (Ultram ) 50 mg tablet 2 times daily as needed for pain.MME=20 Monitoring: [place] PMP: PDMP reviewed during this encounter.       Pharmacotherapy: No side-effects or adverse reactions reported. Compliance: No problems identified. Effectiveness: Clinically acceptable.  Ellen Drake, [place]  07/27/2024 11:57 AM  Sign when Signing Visit Nursing Pain Medication Assessment:  Safety precautions to be maintained throughout the outpatient stay will include: orient to surroundings, keep bed in low position, maintain call bell within reach  at all times, provide assistance with transfer out of bed and  ambulation.  Medication Inspection Compliance: Pill count conducted under aseptic conditions, in front of the patient. Neither the pills nor the bottle was removed from the patient's sight at any time. Once count was completed pills were immediately returned to the patient in their original bottle.  Medication: Tramadol  (Ultram ) Pill/Patch Count: 0 of 60 pills/patches remain Pill/Patch Appearance: Markings consistent with prescribed medication Bottle Appearance: Standard pharmacy container. Clearly labeled. Filled Date: 46 / 10 / 2025 Last Medication intake:  Ran out of medicine more than 48 hours ago    UDS:  Summary  Date Value Ref Range Status  05/27/2023 Note  Final    Comment:    ==================================================================== [employer]ox[employer] Select 13 (MW) ==================================================================== Test                             Result       Flag       Units  Drug Present and Declared for Prescription Verification   Tramadol                        >3185        EXPECTED   ng/mg creat   O-Desmethyltramadol            >3185        EXPECTED   ng/mg creat   N-Desmethyltramadol            468          EXPECTED   ng/mg creat    Source of tramadol  is a prescription medication. O-desmethyltramadol    and N-desmethyltramadol are expected metabolites of tramadol .  ==================================================================== Test                      Result    Flag   Units      Ref Range   Creatinine              157              mg/dL      >=79 ==================================================================== Declared Medications:  The flagging and interpretation on this report are based on the  following declared medications.  Unexpected results may arise from  inaccuracies in the declared medications.   **Note: The testing scope of this panel includes these medications:   Tramadol  (Ultram )   **Note: The testing scope of this  panel does not include the  following reported medications:   Amlodipine  (Norvasc )  Aspirin  Carvedilol  (Coreg )  Hydrochlorothiazide   Latanoprost  (Xalatan )  Magnesium  (Mag-Ox)  Potassium (Klor-Con )  Rosuvastatin  (Crestor )  Sulfasalazine  (Azulfidine )  Timolol  (Timoptic )  Triamcinolone  (Kenalog ) ==================================================================== For clinical consultation, please call [phone]. ====================================================================     No results found for: CBDTHCR No results found for: D8THCCBX No results found for: D9THCCBX  ROS  Constitutional: Denies any fever or chills Gastrointestinal: No reported hemesis, hematochezia, vomiting, or acute GI distress Musculoskeletal: Right knee pain Neurological: No reported episodes of acute onset apraxia, aphasia, dysarthria, agnosia, amnesia, paralysis, loss of coordination, or loss of consciousness  Medication Review  Aspirin, Magnesium  Glycinate, amLODipine , carvedilol , cyanocobalamin , fluticasone , folic acid, guaiFENesin -codeine , hydrochlorothiazide , latanoprost , potassium chloride , rosuvastatin , sulfaSALAzine , timolol , traMADol , and triamcinolone  cream  History Review  Allergy: Ms. Sieloff is allergic to mercaptopurine, lyrica [pregabalin], and dilaudid  [hydromorphone  hcl]. Drug: Ms. Kuc  reports no history of drug use. Alcohol:  reports current  alcohol use of about 1.0 standard drink of alcohol per week. Tobacco:  reports that she quit smoking about 14 years ago. Her smoking use included cigarettes. She has never used smokeless tobacco. Social: Ms. Eno  reports that she quit smoking about 14 years ago. Her smoking use included cigarettes. She has never used smokeless tobacco. She reports current alcohol use of about 1.0 standard drink of alcohol per week. She reports that she does not use drugs. Medical:  has a past medical history of Anemia, Colon polyp (2009),  Crohn's disease (HCC), Diverticulosis, Fibrocystic breast disease, Grade I diastolic dysfunction, Herniated nucleus pulposus of lumbosacral region, History of chicken pox, Hypercholesterolemia, Hyperlipidemia, Hypertension, Mild aortic stenosis by prior echocardiogram, Mild mitral regurgitation by prior echocardiogram, Osteoarthritis, Pacemaker, Pulmonary nodule seen on imaging study (2004), and Second degree AV block, Mobitz type II. Surgical: Ms. Amison  has a past surgical history that includes Back surgery (1992); anterior cervical discectomy and fusion (1998); Knee surgery (Right, 1/16); Colonoscopy (2009); Colon surgery (1998); Posterior laminectomy / decompression lumbar spine (1999); Rotator cuff repair (Left, 2007); Colonoscopy w/ biopsies (11/30/2012); Colectomy (2003); Replacement total knee; Knee arthroscopy w/ synovectomy (Right, 03/10/2014); Colonoscopy w/ biopsies (04/15/2016); Cataract extraction w/PHACO (Right, 02/09/2019); Breast biopsy (Left); PACEMAKER IMPLANT (N/A, 03/19/2023); and Cataract extraction w/PHACO (Left, 11/11/2023). Family: family history includes CVA in her mother; Hypertension in her mother and sister.  Laboratory Chemistry Profile   Renal Lab Results  Component Value Date   BUN 17 06/14/2024   CREATININE 0.84 06/14/2024   GFR 66.49 06/14/2024   GFRNONAA 58 (L) 03/20/2023    Hepatic Lab Results  Component Value Date   AST 20 06/14/2024   ALT 10 06/14/2024   ALBUMIN 4.0 06/14/2024   ALKPHOS 52 06/14/2024    Electrolytes Lab Results  Component Value Date   NA 141 06/14/2024   K 4.1 06/14/2024   CL 104 06/14/2024   CALCIUM  9.4 06/14/2024   MG 1.7 02/15/2024    Bone Lab Results  Component Value Date   VD25OH 40.55 10/15/2023    Inflammation (CRP: Acute Phase) (ESR: Chronic Phase) Lab Results  Component Value Date   CRP 1.7 10/15/2023   ESRSEDRATE 27 10/15/2023         Note: Above Lab results reviewed.  Recent Imaging Review  CUP PACEART REMOTE  DEVICE CHECK PPM Scheduled remote reviewed. Normal device function.  Presenting rhythm: AS-VS.  1 VHR detection consistent with 6 beat NSVT.  Next remote transmission per protocol. - CS, CVRS Note: Reviewed        Physical Exam  Vitals: BP (!) 152/82 (BP Location: Right Arm, Patient Position: Sitting, Cuff Size: Normal)   Pulse 79   Temp (!) 97.2 F (36.2 C)   Resp 18   Ht 5' 5.5 (1.664 m)   Wt 142 lb (64.4 kg)   LMP 08/13/1995   SpO2 99%   BMI 23.27 kg/m  BMI: Estimated body mass index is 23.27 kg/m as calculated from the following:   Height as of this encounter: 5' 5.5 (1.664 m).   Weight as of this encounter: 142 lb (64.4 kg). Ideal: Ideal body weight: 58.2 kg (128 lb 3.2 oz) Adjusted ideal body weight: 60.7 kg (133 lb 11.5 oz) General appearance: Well nourished, well developed, and well hydrated. In no apparent acute distress Mental status: Alert, oriented x 3 (person, place, & time)       Respiratory: No evidence of acute respiratory distress Eyes: PERLA  Musculoskeletal: Right knee pain worse  with walking and weightbearing Assessment   Diagnosis Status  1. Chronic knee pain (1ry area of Pain) (Right)   2. Pharmacologic therapy   3. 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 Chronic knee pain s/p TKR (Right)   5. Chronic pain syndrome   6. Chronic use of opiate for therapeutic purpose   7. Encounter for medication management   8. Encounter for chronic pain management    Controlled Controlled Controlled   Updated Problems: No problems updated.  Plan of Care  Problem-specific:  Assessment and Plan    Chronic right knee pain after total knee replacement Chronic right knee pain persists post multiple knee replacements, indicating potential complications or suboptimal outcomes. Another knee replacement scheduled next year. - Continue tramadol  twice daily. - Sent tramadol  prescription refill to [facility].  Chronic pain  management Chronic pain managed with tramadol  twice daily without side effects. - Continue tramadol  twice daily.  Pharmacologic therapy for chronic pain Tramadol  effective for chronic pain management without adverse effects. - Continue tramadol  twice daily.  Patient's pain is controlled with tramadol , will continue on current medication regimen.  Prescribing drug monitoring (PDMP) reviewed, findings consistent with the use of prescribed medication and no evidence of narcotic misuse or abuse.  Urine drug screening (UDS) up-to-date.  No side effects or adverse reaction reported to medication.  Schedule follow-up in 3 months for medication management.      Ms. Demyah Day Dsouza has a current medication list which includes the following long-term medication(s): amlodipine , carvedilol , fluticasone , hydrochlorothiazide , potassium chloride , rosuvastatin , sulfasalazine , and tramadol .  Pharmacotherapy (Medications Ordered): Meds ordered this encounter  Medications   traMADol  (ULTRAM ) 50 MG tablet    Sig: Take 1 tablet (50 mg total) by mouth 2 (two) times daily. Each refill must last 30 days.    Dispense:  60 tablet    Refill:  5    DO NOT: delete (not duplicate); no partial-fill (will deny script to complete), no refill request (F/U required). DISPENSE: 1 day early if closed on fill date. WARN: No CNS-depressants within 8 hrs of med.   Orders:  No orders of the defined types were placed in this encounter.       Return in about 3 months (around 10/25/2024) for (F2F), (MM), Emmy Blanch NP.    Recent Visits No visits were found meeting these conditions. Showing recent visits within past 90 days and meeting all other requirements Today's Visits Date Type Provider Dept  07/27/24 Office Visit Addasyn Mcbreen K, NP [facility]  Showing today's visits and meeting all other requirements Future Appointments Date Type Provider Dept  10/24/24 Appointment Odester Nilson K, NP [facility]  Showing future appointments within next 90 days and meeting all other requirements  I discussed the assessment and treatment plan with the patient. The patient was provided an opportunity to ask questions and all were answered. The patient agreed with the plan and demonstrated an understanding of the instructions.  Patient advised to call back or seek an in-person evaluation if the symptoms or condition worsens.  I personally spent a total of 30 minutes in the care of the patient today including preparing to see the patient, getting/reviewing separately obtained history, performing a medically appropriate exam/evaluation, counseling and educating, placing orders, referring and communicating with other health care professionals, documenting clinical information in the EHR, independently interpreting results, communicating results, and coordinating care.   Note by: Cherokee Boccio K Indie Boehne, NP (TTS and  AI technology used. I apologize for any typographical errors that were not detected and corrected.) Date: 07/27/2024; Time: 12:36 PM

## 2024-07-27 NOTE — Progress Notes (Signed)
 Nursing Pain Medication Assessment:  Safety precautions to be maintained throughout the outpatient stay will include: orient to surroundings, keep bed in low position, maintain call bell within reach at all times, provide assistance with transfer out of bed and ambulation.  Medication Inspection Compliance: Pill count conducted under aseptic conditions, in front of the patient. Neither the pills nor the bottle was removed from the patient's sight at any time. Once count was completed pills were immediately returned to the patient in their original bottle.  Medication: Tramadol  (Ultram ) Pill/Patch Count: 0 of 60 pills/patches remain Pill/Patch Appearance: Markings consistent with prescribed medication Bottle Appearance: Standard pharmacy container. Clearly labeled. Filled Date: 49 / 10 / 2025 Last Medication intake:  Ran out of medicine more than 48 hours ago

## 2024-07-27 NOTE — Telephone Encounter (Signed)
 Refusing. Pt picked up 90 day supply today (07/27/24) pt did verify this was the correct dosing but previous conversations pt stated she was taking differently. Will review again when pt comes in for appt with pcp

## 2024-08-15 NOTE — Discharge Instructions (Signed)
 Instructions after Total Knee Replacement   Kincaid Tiger P. Melrose Kearse, Jr., M.D.    Dept. of Orthopaedics & Sports Medicine [facility] [address] [place], [place]  [address]  Phone: [phone]   Fax: [phone]       [email or web address]       DIET: Drink plenty of non-alcoholic fluids. Resume your normal diet. Include foods high in fiber.  ACTIVITY:  You may use crutches or a walker with weight-bearing as tolerated, unless instructed otherwise. You may be weaned off of the walker or crutches by your Physical Therapist.  Do NOT place pillows under the knee. Anything placed under the knee could limit your ability to straighten the knee.   Use the Bone Foam 3 times a day for 30 minutes each session to help straighten the knee. Continue doing gentle exercises. Exercising will reduce the pain and swelling, increase motion, and prevent muscle weakness.   Please continue to use the TED compression stockings for 6 weeks. You may remove the stockings at night, but should reapply them in the morning. Do not drive or operate any equipment until instructed.  WOUND CARE:  The initial dressing (Aquacel) can remain in place for 7 days (see separate instructions). Continue to use the PolarCare or ice packs periodically to reduce pain and swelling. You may bathe or shower after the staples are removed at the first office visit following surgery.  MEDICATIONS: You may resume your regular medications. Please take the pain medication as prescribed on the medication. Do not take pain medication on an empty stomach. Unless instructed otherwise, you should take an enteric-coated aspirin  81 mg. TWICE a day. (This along with elevation will help reduce the possibility of blood clots/phlebitis in your operated leg.) Use a stool softener (such as Senokot-S or Colace) daily and a laxative (such as Miralax  or Dulcolax) as needed to prevent constipation.  Do not drive or drink alcoholic beverages when  taking pain medications.  CALL THE OFFICE FOR: Temperature above 101 degrees Excessive bleeding or drainage on the dressing. Excessive swelling, coldness, or paleness of the toes. Persistent nausea and vomiting.  FOLLOW-UP:  You should have an appointment to return to the office in 10-14 days after surgery. Arrangements have been made for continuation of Physical Therapy (either home therapy or outpatient therapy).     [facility] Department Directory         [email or web address]       [email or web address]          Cardiology  Appointments: [place] - [phone]  Endocrinology  Appointments: [place] [phone] [place] - [phone]  Gastroenterology  Appointments: [place] [phone] [place] - [phone]        General Surgery   Appointments: [facility]  Internal Medicine/Family Medicine  Appointments: [facility] - [phone] [place] - [phone]  Metabolic and Weigh Loss Surgery  Appointments: [facility]        Neurology  Appointments: [place] [phone] [place] - [phone]  Neurosurgery  Appointments: [place]  Obstetrics & Gynecology  Appointments: [place] [phone] [place] - [phone]        Pediatrics  Appointments: [place] [phone] [place] - [phone]  Physiatry  Appointments: [place] [phone]  Physical Therapy  Appointments: [place] - [phone]        Podiatry  Appointments: [place] [phone] [place] - [phone]  Pulmonology  Appointments: [place]  Rheumatology  Appointments: [place] [phone]  [place] Location: [facility]  [address] [place], [place]  [address]  [place] Location: [facility]. [address] [place], [place]  [address]  [place]  Location: [facility] [address] [place], [place]  [address]

## 2024-08-16 ENCOUNTER — Encounter
Admission: RE | Admit: 2024-08-16 | Discharge: 2024-08-16 | Disposition: A | Discharge status: Home / Self Care | Source: Ambulatory Visit | Attending: Orthopedic Surgery | Admitting: Orthopedic Surgery

## 2024-08-16 ENCOUNTER — Encounter: Payer: Self-pay | Admitting: Cardiology

## 2024-08-16 ENCOUNTER — Other Ambulatory Visit: Payer: Self-pay

## 2024-08-16 VITALS — BP 131/51 | HR 71 | Resp 16 | Wt 146.4 lb

## 2024-08-16 DIAGNOSIS — Z96651 Presence of right artificial knee joint: Secondary | ICD-10-CM | POA: Diagnosis not present

## 2024-08-16 DIAGNOSIS — Z01812 Encounter for preprocedural laboratory examination: Secondary | ICD-10-CM | POA: Diagnosis present

## 2024-08-16 DIAGNOSIS — Z01818 Encounter for other preprocedural examination: Secondary | ICD-10-CM | POA: Insufficient documentation

## 2024-08-16 DIAGNOSIS — G8929 Other chronic pain: Secondary | ICD-10-CM | POA: Insufficient documentation

## 2024-08-16 DIAGNOSIS — Z01810 Encounter for preprocedural cardiovascular examination: Secondary | ICD-10-CM | POA: Diagnosis not present

## 2024-08-16 DIAGNOSIS — D649 Anemia, unspecified: Secondary | ICD-10-CM | POA: Insufficient documentation

## 2024-08-16 DIAGNOSIS — M25561 Pain in right knee: Secondary | ICD-10-CM | POA: Diagnosis not present

## 2024-08-16 DIAGNOSIS — T84498A Other mechanical complication of other internal orthopedic devices, implants and grafts, initial encounter: Secondary | ICD-10-CM | POA: Insufficient documentation

## 2024-08-16 LAB — TYPE AND SCREEN
ABO/RH(D): O POS
Antibody Screen: NEGATIVE

## 2024-08-16 LAB — SURGICAL PCR SCREEN
MRSA, PCR: NEGATIVE
Staphylococcus aureus: NEGATIVE

## 2024-08-16 LAB — C-REACTIVE PROTEIN: CRP: 2 mg/dL — ABNORMAL HIGH

## 2024-08-16 LAB — URINALYSIS, ROUTINE W REFLEX MICROSCOPIC
Bilirubin Urine: NEGATIVE
Glucose, UA: NEGATIVE mg/dL
Hgb urine dipstick: NEGATIVE
Ketones, ur: NEGATIVE mg/dL
Leukocytes,Ua: NEGATIVE
Nitrite: NEGATIVE
Protein, ur: NEGATIVE mg/dL
Specific Gravity, Urine: 1.025 (ref 1.005–1.030)
pH: 5 (ref 5.0–8.0)

## 2024-08-16 LAB — COMPREHENSIVE METABOLIC PANEL WITH GFR
ALT: 10 U/L (ref 0–44)
AST: 23 U/L (ref 15–41)
Albumin: 4.3 g/dL (ref 3.5–5.0)
Alkaline Phosphatase: 61 U/L (ref 38–126)
Anion gap: 11 (ref 5–15)
BUN: 17 mg/dL (ref 8–23)
CO2: 29 mmol/L (ref 22–32)
Calcium: 9.8 mg/dL (ref 8.9–10.3)
Chloride: 104 mmol/L (ref 98–111)
Creatinine, Ser: 0.85 mg/dL (ref 0.44–1.00)
GFR, Estimated: >60 mL/min
Glucose, Bld: 84 mg/dL (ref 70–99)
Potassium: 3.5 mmol/L (ref 3.5–5.1)
Sodium: 143 mmol/L (ref 135–145)
Total Bilirubin: 0.3 mg/dL (ref 0.0–1.2)
Total Protein: 6.9 g/dL (ref 6.5–8.1)

## 2024-08-16 LAB — CBC
HCT: 36.3 % (ref 36.0–46.0)
Hemoglobin: 12 g/dL (ref 12.0–15.0)
MCH: 32.3 pg (ref 26.0–34.0)
MCHC: 33.1 g/dL (ref 30.0–36.0)
MCV: 97.6 fL (ref 80.0–100.0)
Platelets: 204 K/uL (ref 150–400)
RBC: 3.72 MIL/uL — ABNORMAL LOW (ref 3.87–5.11)
RDW: 12.5 % (ref 11.5–15.5)
WBC: 7.1 K/uL (ref 4.0–10.5)
nRBC: 0 % (ref 0.0–0.2)

## 2024-08-16 LAB — SEDIMENTATION RATE: Sed Rate: 26 mm/h (ref 0–30)

## 2024-08-16 NOTE — Progress Notes (Signed)
 PERIOPERATIVE PRESCRIPTION FOR IMPLANTED CARDIAC DEVICE PROGRAMMING  Patient Information: Name:  Ellen Drake  DOB:  06-26-1946  MRN:  [id number]   Planned Procedure: Right Knee Arthroplasty - Revision  Surgeon: Lynwood Hue, MD Date of Procedure: 08/22/2024 Position during surgery: Supine Cautery will be used.   Device Information:  Clinic EP Physician:  Soyla Norton, MD   Device Type:  Pacemaker Manufacturer and Phone #:  Medtronic: [id number] Pacemaker Dependent?:  Yes.   Date of Last Device Check:  06/17/2024  Normal Device Function?:  Yes.    Electrophysiologist's Recommendations:  Have magnet available. Provide continuous ECG monitoring when magnet is used or reprogramming is to be performed.  Procedure should not interfere with device function.  No device programming or magnet placement needed.  Per [facility] [address] [place] [name], [place]  4:09 PM 08/16/2024

## 2024-08-16 NOTE — Patient Instructions (Addendum)
 Your procedure is scheduled on: 08/22/24 - Monday Report to the Registration Desk on the 1st floor of the [facility]. To find out your arrival time, please call [phone] between 1PM - 3PM on: 08/19/24 - Friday If your arrival time is 6:00 am, do not arrive before that time as the [facility] entrance doors do not open until 6:00 am.  REMEMBER: Instructions that are not followed completely may result in serious medical risk, up to and including death; or upon the discretion of your surgeon and anesthesiologist your surgery may need to be rescheduled.  Do not eat food after midnight the night before surgery.  No gum chewing or hard candies.  You may however, drink CLEAR liquids up to 2 hours before you are scheduled to arrive for your surgery. Do not drink anything within 2 hours of your scheduled arrival time.  Clear liquids include: - water  - apple juice without pulp - gatorade (not RED colors) - black coffee or tea (Do NOT add milk or creamers to the coffee or tea) Do NOT drink anything that is not on this list.  In addition, your doctor has ordered for you to drink the provided:  Ensure Pre-Surgery Clear Carbohydrate Drink  Drinking this carbohydrate drink up to two hours before surgery helps to reduce insulin resistance and improve patient outcomes. Please complete drinking 2 hours before scheduled arrival time.  One week prior to surgery: Stop Anti-inflammatories (NSAIDS) such as Advil, Aleve, Ibuprofen, Motrin, Naproxen, Naprosyn ,VOLTAREN ,and Aspirin based products such as Excedrin, Goody's Powder, BC Powder. You may continue to take Tylenol  and traMADol  (ULTRAM ) if needed for pain up until the day of surgery.  Stop ANY OVER THE COUNTER supplements until after surgery - folic acid (FOLVITE)   ON THE DAY OF SURGERY ONLY TAKE THESE MEDICATIONS WITH SIPS OF WATER:  amLODipine  (NORVASC )  carvedilol  (COREG )  timolol  (TIMOPTIC )     No Alcohol for 24 hours before or  after surgery.  No Smoking including e-cigarettes for 24 hours before surgery.  No chewable tobacco products for at least 6 hours before surgery.  No nicotine patches on the day of surgery.  Do not use any recreational drugs for at least a week (preferably 2 weeks) before your surgery.  Please be advised that the combination of cocaine and anesthesia may have negative outcomes, up to and including death. If you test positive for cocaine, your surgery will be cancelled.  On the morning of surgery brush your teeth with toothpaste and water, you may rinse your mouth with mouthwash if you wish. Do not swallow any toothpaste or mouthwash.  Use CHG Soap or wipes as directed on instruction sheet.  Do not wear jewelry, make-up, hairpins, clips or nail polish.  For welded (permanent) jewelry: bracelets, anklets, waist bands, etc.  Please have this removed prior to surgery.  If it is not removed, there is a chance that hospital personnel will need to cut it off on the day of surgery.  Do not wear lotions, powders, or perfumes.   Do not shave body hair from the neck down 48 hours before surgery.  Contact lenses, hearing aids and dentures may not be worn into surgery.  Do not bring valuables to the hospital. [facility] is not responsible for any missing/lost belongings or valuables.   Notify your doctor if there is any change in your medical condition (cold, fever, infection).  Wear comfortable clothing (specific to your surgery type) to the hospital.  After  surgery, you can help prevent lung complications by doing breathing exercises.  Take deep breaths and cough every 1-2 hours. Your doctor may order a device called an Incentive Spirometer to help you take deep breaths.  If you are being admitted to the hospital overnight, leave your suitcase in the car. After surgery it may be brought to your room.  In case of increased patient census, it may be necessary for you, the patient, to  continue your postoperative care in the Same Day Surgery department.  If you are being discharged the day of surgery, you will not be allowed to drive home. You will need a responsible individual to drive you home and stay with you for 24 hours after surgery.   If you are taking public transportation, you will need to have a responsible individual with you.  Please call the Pre-admissions Testing Dept. at [phone] if you have any questions about these instructions.  Surgery Visitation Policy:  Patients having surgery or a procedure may have two visitors.  Children under the age of 77 must have an adult with them who is not the patient.  Inpatient Visitation:    Visiting hours are 7 a.m. to 8 p.m. Up to four visitors are allowed at one time in a patient room. The visitors may rotate out with other people during the day.  One visitor age 63 or older may stay with the patient overnight and must be in the room by 8 p.m.   Merchandiser, Retail to address health-related social needs:  [email or web address]    Pre-operative 4 CHG Bath Instructions   You can play a key role in reducing the risk of infection after surgery. Your skin needs to be as free of germs as possible. You can reduce the number of germs on your skin by washing with CHG (chlorhexidine  gluconate) soap before surgery. CHG is an antiseptic soap that kills germs and continues to kill germs even after washing.   DO NOT use if you have an allergy to chlorhexidine /CHG or antibacterial soaps. If your skin becomes reddened or irritated, stop using the CHG and notify one of our RNs at [phone].   Please shower with the CHG soap starting 4 days before surgery using the following schedule: 01/08 - 01/11.    Please keep in mind the following:  DO NOT shave, including legs and underarms, starting the day of your first shower.   You may shave your face at any point before/day of surgery.  Place clean  sheets on your bed the day you start using CHG soap. Use a clean washcloth (not used since being washed) for each shower. DO NOT sleep with pets once you start using the CHG.   CHG Shower Instructions:  If you choose to wash your hair and private area, wash first with your normal shampoo/soap.  After you use shampoo/soap, rinse your hair and body thoroughly to remove shampoo/soap residue.  Turn the water OFF and apply about 3 tablespoons (45 ml) of CHG soap to a CLEAN washcloth.  Apply CHG soap ONLY FROM YOUR NECK DOWN TO YOUR TOES (washing for 3-5 minutes)  DO NOT use CHG soap on face, private areas, open wounds, or sores.  Pay special attention to the area where your surgery is being performed.  If you are having back surgery, having someone wash your back for you may be helpful. Wait 2 minutes after CHG soap is applied, then you may rinse off the CHG soap.  Pat dry  with a clean towel  Put on clean clothes/pajamas   If you choose to wear lotion, please use ONLY the CHG-compatible lotions on the back of this paper.     Additional instructions for the day of surgery: DO NOT APPLY any lotions, deodorants, cologne, or perfumes.   Put on clean/comfortable clothes.  Brush your teeth.  Ask your nurse before applying any prescription medications to the skin.      CHG Compatible Lotions   Aveeno Moisturizing lotion  Cetaphil Moisturizing Cream  Cetaphil Moisturizing Lotion  Clairol Herbal Essence Moisturizing Lotion, Dry Skin  Clairol Herbal Essence Moisturizing Lotion, Extra Dry Skin  Clairol Herbal Essence Moisturizing Lotion, Normal Skin  Curel Age Defying Therapeutic Moisturizing Lotion with Alpha Hydroxy  Curel Extreme Care Body Lotion  Curel Soothing Hands Moisturizing Hand Lotion  Curel Therapeutic Moisturizing Cream, Fragrance-Free  Curel Therapeutic Moisturizing Lotion, Fragrance-Free  Curel Therapeutic Moisturizing Lotion, Original Formula  Eucerin Daily Replenishing  Lotion  Eucerin Dry Skin Therapy Plus Alpha Hydroxy Crme  Eucerin Dry Skin Therapy Plus Alpha Hydroxy Lotion  Eucerin Original Crme  Eucerin Original Lotion  Eucerin Plus Crme Eucerin Plus Lotion  Eucerin TriLipid Replenishing Lotion  Keri Anti-Bacterial Hand Lotion  Keri Deep Conditioning Original Lotion Dry Skin Formula Softly Scented  Keri Deep Conditioning Original Lotion, Fragrance Free Sensitive Skin Formula  Keri Lotion Fast Absorbing Fragrance Free Sensitive Skin Formula  Keri Lotion Fast Absorbing Softly Scented Dry Skin Formula  Keri Original Lotion  Keri Skin Renewal Lotion Keri Silky Smooth Lotion  Keri Silky Smooth Sensitive Skin Lotion  Nivea Body Creamy Conditioning Oil  Nivea Body Extra Enriched Lotion  Nivea Body Original Lotion  Nivea Body Sheer Moisturizing Lotion Nivea Crme  Nivea Skin Firming Lotion  NutraDerm 30 Skin Lotion  NutraDerm Skin Lotion  NutraDerm Therapeutic Skin Cream  NutraDerm Therapeutic Skin Lotion  ProShield Protective Hand Cream  Provon moisturizing lotion  How to Use an Incentive Spirometer  An incentive spirometer is a tool that measures how well you are filling your lungs with each breath. Learning to take long, deep breaths using this tool can help you keep your lungs clear and active. This may help to reverse or lessen your chance of developing breathing (pulmonary) problems, especially infection. You may be asked to use a spirometer: After a surgery. If you have a lung problem or a history of smoking. After a long period of time when you have been unable to move or be active. If the spirometer includes an indicator to show the highest number that you have reached, your health care provider or respiratory therapist will help you set a goal. Keep a log of your progress as told by your health care provider. What are the risks? Breathing too quickly may cause dizziness or cause you to pass out. Take your time so you do not get dizzy or  light-headed. If you are in pain, you may need to take pain medicine before doing incentive spirometry. It is harder to take a deep breath if you are having pain. How to use your incentive spirometer  Sit up on the edge of your bed or on a chair. Hold the incentive spirometer so that it is in an upright position. Before you use the spirometer, breathe out normally. Place the mouthpiece in your mouth. Make sure your lips are closed tightly around it. Breathe in slowly and as deeply as you can through your mouth, causing the piston or the ball to rise toward the  top of the chamber. Hold your breath for 3-5 seconds, or for as long as possible. If the spirometer includes a coach indicator, use this to guide you in breathing. Slow down your breathing if the indicator goes above the marked areas. Remove the mouthpiece from your mouth and breathe out normally. The piston or ball will return to the bottom of the chamber. Rest for a few seconds, then repeat the steps 10 or more times. Take your time and take a few normal breaths between deep breaths so that you do not get dizzy or light-headed. Do this every 1-2 hours when you are awake. If the spirometer includes a goal marker to show the highest number you have reached (best effort), use this as a goal to work toward during each repetition. After each set of 10 deep breaths, cough a few times. This will help to make sure that your lungs are clear. If you have an incision on your chest or abdomen from surgery, place a pillow or a rolled-up towel firmly against the incision when you cough. This can help to reduce pain while taking deep breaths and coughing. General tips When you are able to get out of bed: Walk around often. Continue to take deep breaths and cough in order to clear your lungs. Keep using the incentive spirometer until your health care provider says it is okay to stop using it. If you have been in the hospital, you may be told to keep  using the spirometer at home. Contact a health care provider if: You are having difficulty using the spirometer. You have trouble using the spirometer as often as instructed. Your pain medicine is not giving enough relief for you to use the spirometer as told. You have a fever. Get help right away if: You develop shortness of breath. You develop a cough with bloody mucus from the lungs. You have fluid or blood coming from an incision site after you cough. Summary An incentive spirometer is a tool that can help you learn to take long, deep breaths to keep your lungs clear and active. You may be asked to use a spirometer after a surgery, if you have a lung problem or a history of smoking, or if you have been inactive for a long period of time. Use your incentive spirometer as instructed every 1-2 hours while you are awake. If you have an incision on your chest or abdomen, place a pillow or a rolled-up towel firmly against your incision when you cough. This will help to reduce pain. Get help right away if you have shortness of breath, you cough up bloody mucus, or blood comes from your incision when you cough. This information is not intended to replace advice given to you by your health care provider. Make sure you discuss any questions you have with your health care provider. Document Revised: 10/17/2019 Document Reviewed: 10/17/2019 Elsevier Patient Education  2023 Elsevier Inc.   Preoperative Educational Videos for Total Hip, Knee and Shoulder Replacements  To better prepare for surgery, please view our videos that explain the physical activity and discharge planning required to have the best surgical recovery at [facility].  [email or web address]  Questions? Call [phone] or email [email or web address]

## 2024-08-17 ENCOUNTER — Telehealth (HOSPITAL_BASED_OUTPATIENT_CLINIC_OR_DEPARTMENT_OTHER): Payer: Self-pay | Admitting: *Deleted

## 2024-08-17 ENCOUNTER — Ambulatory Visit: Attending: Nurse Practitioner | Admitting: Nurse Practitioner

## 2024-08-17 ENCOUNTER — Telehealth (HOSPITAL_BASED_OUTPATIENT_CLINIC_OR_DEPARTMENT_OTHER): Payer: Self-pay

## 2024-08-17 DIAGNOSIS — Z01810 Encounter for preprocedural cardiovascular examination: Secondary | ICD-10-CM | POA: Diagnosis not present

## 2024-08-17 NOTE — Telephone Encounter (Signed)
 Left message for pt to call back today to be added on today for preop tele appt, due to proc 1/12 and med hold ASA. Per Damien Braver, NP to add pt on today.

## 2024-08-17 NOTE — Telephone Encounter (Signed)
 Pt called back and she has been added on for tele preop appt. Med rec and consent are done.

## 2024-08-17 NOTE — Telephone Encounter (Signed)
 Pt called back and she has been added on for tele preop appt. Med rec and consent are done.      Patient Consent for Virtual Visit        Ellen Drake has provided verbal consent on 08/17/2024 for a virtual visit (video or telephone).   CONSENT FOR VIRTUAL VISIT FOR:  Ellen Drake  By participating in this virtual visit I agree to the following:  I hereby voluntarily request, consent and authorize [facility] and its employed or contracted physicians, physician assistants, nurse practitioners or other licensed health care professionals (the Practitioner), to provide me with telemedicine health care services (the Services) as deemed necessary by the treating Practitioner. I acknowledge and consent to receive the Services by the Practitioner via telemedicine. I understand that the telemedicine visit will involve communicating with the Practitioner through live audiovisual communication technology and the disclosure of certain medical information by electronic transmission. I acknowledge that I have been given the opportunity to request an in-person assessment or other available alternative prior to the telemedicine visit and am voluntarily participating in the telemedicine visit.  I understand that I have the right to withhold or withdraw my consent to the use of telemedicine in the course of my care at any time, without affecting my right to future care or treatment, and that the Practitioner or I may terminate the telemedicine visit at any time. I understand that I have the right to inspect all information obtained and/or recorded in the course of the telemedicine visit and may receive copies of available information for a reasonable fee.  I understand that some of the potential risks of receiving the Services via telemedicine include:  Delay or interruption in medical evaluation due to technological equipment failure or disruption; Information transmitted may not be sufficient  (e.g. poor resolution of images) to allow for appropriate medical decision making by the Practitioner; and/or  In rare instances, security protocols could fail, causing a breach of personal health information.  Furthermore, I acknowledge that it is my responsibility to provide information about my medical history, conditions and care that is complete and accurate to the best of my ability. I acknowledge that Practitioner's advice, recommendations, and/or decision may be based on factors not within their control, such as incomplete or inaccurate data provided by me or distortions of diagnostic images or specimens that may result from electronic transmissions. I understand that the practice of medicine is not an exact science and that Practitioner makes no warranties or guarantees regarding treatment outcomes. I acknowledge that a copy of this consent can be made available to me via my patient portal [facility] MyChart), or I can request a printed copy by calling the office of [facility].    I understand that my insurance will be billed for this visit.   I have read or had this consent read to me. I understand the contents of this consent, which adequately explains the benefits and risks of the Services being provided via telemedicine.  I have been provided ample opportunity to ask questions regarding this consent and the Services and have had my questions answered to my satisfaction. I give my informed consent for the services to be provided through the use of telemedicine in my medical care

## 2024-08-17 NOTE — Telephone Encounter (Signed)
"  ° °  Pre-operative Risk Assessment    Patient Name: Ellen Drake  DOB: 09-Aug-1946 MRN: [id number]   Date of last office visit: 01/29/24 with Beecher  Date of next office visit: NA  Request for Surgical Clearance    Procedure:  Right Total Knee Revision   Date of Surgery:  Clearance 08/22/24                                 Surgeon:  Dr. Mardee  Surgeon's Group or Practice Name:  [facility]  Phone number:  [phone] Fax number:  [phone]   Type of Clearance Requested:   - Medical  - Pharmacy:  Hold Aspirin not indicated   Type of Anesthesia:  Not Indicated   Additional requests/questions:    Bonney Augustin JONETTA Delores   08/17/2024, 1:41 PM   "

## 2024-08-17 NOTE — Telephone Encounter (Signed)
" ° °  Name: Ellen Drake  DOB: 1946/04/21  MRN: [id number]  Primary Cardiologist: Shelda Bruckner, MD   Preoperative team, please contact this patient and set up a phone call appointment for further preoperative risk assessment. Please obtain consent and complete medication review. Thank you for your help.  I confirm that guidance regarding antiplatelet and oral anticoagulation therapy has been completed and, if necessary, noted below.   Regarding ASA therapy, we recommend continuation of ASA throughout the perioperative period.  However, if the surgeon feels that cessation of ASA is required in the perioperative period, it may be stopped 5-7 days prior to surgery with a plan to resume it as soon as felt to be feasible from a surgical standpoint in the post-operative period.   I also confirmed the patient resides in the state of [place] . As per [facility] Medical Board telemedicine laws, the patient must reside in the state in which the provider is licensed.   Rollo FABIENE Louder, PA-C 08/17/2024, 1:55 PM [facility]    "

## 2024-08-17 NOTE — Progress Notes (Signed)
 "   Virtual Visit via Telephone Note   Because of Ellen Drake co-morbid illnesses, she is at least at moderate risk for complications without adequate follow up.  This format is felt to be most appropriate for this patient at this time.  Due to technical limitations with video connection (technology), today's appointment will be conducted as an audio only telehealth visit, and Ellen Drake verbally agreed to proceed in this manner.   All issues noted in this document were discussed and addressed.  No physical exam could be performed with this format.  Evaluation Performed:  Preoperative cardiovascular risk assessment _____________   Date:  08/17/2024   Patient ID:  Ellen Drake, Ellen Drake 06/24/1946, MRN [id number] Patient Location:  Home Provider location:   Office  Primary Care Provider:  Glendia Shad, MD Primary Cardiologist:  Shelda Bruckner, MD  Chief Complaint / Patient Profile   79 y.o. y/o female with a h/o sinus node dysfunction, high-grade AV block s/p PPM, hypertension, hyperlipidemia, and Crohn's disease who is pending right total knee revision on 08/22/2024 with Dr. Mardee of [facility] and presents today for telephonic preoperative cardiovascular risk assessment.  History of Present Illness    Ellen Drake is a 79 y.o. female who presents via audio/video conferencing for a telehealth visit today.  Pt was last seen in cardiology clinic on 01/29/2024 by Suzann Riddle, NP. At that time Ellen Drake was doing well.  The patient is now pending procedure as outlined above. Since her last visit, she has done well from a cardiac standpoint.   She denies chest pain, palpitations, dyspnea, pnd, orthopnea, n, v, dizziness, syncope, edema, weight gain, or early satiety. All other systems reviewed and are otherwise negative except as noted above.   Past Medical History    Past Medical History:  Diagnosis Date   Anemia    reslved   Colon  polyp 2009   Crohn's disease (HCC)    Diverticulosis    Fibrocystic breast disease    Grade I diastolic dysfunction    Herniated nucleus pulposus of lumbosacral region    History of chicken pox    Hypercholesterolemia    Hyperlipidemia    Hypertension    Mild aortic stenosis by prior echocardiogram    Mild mitral regurgitation by prior echocardiogram    Osteoarthritis    Pacemaker    Pulmonary nodule seen on imaging study 2004   resolved 2007   Second degree AV block, Mobitz type II    Past Surgical History:  Procedure Laterality Date   anterior cervical discectomy and fusion  1998   Dr Drury SANES SURGERY  1992   ruptured disc (Dr Kathi)   BREAST BIOPSY Left    neg-no scar seen   CATARACT EXTRACTION W/PHACO Right 02/09/2019   Procedure: CATARACT EXTRACTION PHACO AND INTRAOCULAR LENS PLACEMENT (IOC) KAHOOK DUAL BLADE GONIOTOMY  RIGHT HEALON 5, VISION BLUE;  Surgeon: Mittie Gaskin, MD;  Location: [facility];  Service: Ophthalmology;  Laterality: Right;   CATARACT EXTRACTION W/PHACO Left 11/11/2023   Procedure: PHACOEMULSIFICATION, CATARACT, WITH IOL INSERTION 11.75 01:03.7;  Surgeon: Mittie Gaskin, MD;  Location[facility];  Service: Ophthalmology;  Laterality: Left;   COLECTOMY  2003   COLON SURGERY  1998   COLONOSCOPY  2009   COLONOSCOPY W/ BIOPSIES  11/30/2012   Procedure: COLONOSCOPY W/BIOPSY; Surgeon: Elijah DELENA SHAUNNA Tanda, MD; Location: [facility]; Service: Gastroenterology;;   COLONOSCOPY W/ BIOPSIES  04/15/2016  Procedure: Colonoscopy ; Surgeon: Elijah DELENA SHAUNNA Tanda, MD; Location: [facility]; Service: Gastroenterology; Laterality: N/A;   KNEE ARTHROSCOPY W/ SYNOVECTOMY Right 03/10/2014   limited   KNEE SURGERY Right 1/16   [facility] (Dr. Durene)   PACEMAKER IMPLANT N/A 03/19/2023   Procedure: PACEMAKER IMPLANT;  Surgeon: Inocencio Soyla Lunger, MD;  Location: [facility];  Service: Cardiovascular;  Laterality:  N/A;   POSTERIOR LAMINECTOMY / DECOMPRESSION LUMBAR SPINE  1999   REPLACEMENT TOTAL KNEE     ROTATOR CUFF REPAIR Left 2007   acute open; accident    Allergies  Allergies[1]  Home Medications    Prior to Admission medications  Medication Sig Start Date End Date Taking? Authorizing Provider  amLODipine  (NORVASC ) 10 MG tablet Take 1 tablet (10 mg total) by mouth daily. 09/21/23   Glendia Shad, MD  aspirin EC 81 MG tablet Take 81 mg by mouth in the morning.    [provider]  carboxymethylcellulose (REFRESH PLUS) 0.5 % SOLN Place 1 drop into both eyes 3 (three) times daily as needed (dry/irritated eyes).    [provider]  carvedilol  (COREG ) 12.5 MG tablet Take 1 tablet (12.5 mg total) by mouth 2 (two) times daily. 12/09/23 12/08/24  Glendia Shad, MD  clobetasol ointment (TEMOVATE) 0.05 % Apply 1 Application topically 2 (two) times daily as needed (skin irritation.).    [provider]  cyanocobalamin  1000 MCG tablet Take 1,000 mcg by mouth daily.    [provider]  diclofenac Sodium (VOLTAREN ARTHRITIS PAIN) 1 % GEL Apply 1 Application topically 4 (four) times daily as needed (knee pain.).    [provider]  folic acid (FOLVITE) 1 MG tablet Take 1 mg by mouth in the morning.    [provider]  hydrochlorothiazide  (MICROZIDE ) 12.5 MG capsule Take 1 capsule (12.5 mg total) by mouth daily. 06/20/24   Glendia Shad, MD  latanoprost  (XALATAN ) 0.005 % ophthalmic solution Place 1 drop into both eyes at bedtime. 10/22/23     potassium chloride  (KLOR-CON  M) 10 MEQ tablet TAKE 2 TABLETS BY MOUTH TWICE A DAY 04/25/24   Glendia Shad, MD  sulfaSALAzine  (AZULFIDINE ) 500 MG tablet Take 3 tablets (1,500 mg total) by mouth 2 (two) times daily. 3 tabs 06/20/24   Glendia Shad, MD  timolol  (TIMOPTIC ) 0.5 % ophthalmic solution Place 1 drop into both eyes in the morning. 11/26/17   [provider]  traMADol  (ULTRAM ) 50 MG tablet Take 1  tablet (50 mg total) by mouth 2 (two) times daily. Each refill must last 30 days. Patient taking differently: Take 50 mg by mouth 2 (two) times daily as needed (knee pain.). Each refill must last 30 days. 07/27/24 01/23/25  Patel, Seema K, NP  triamcinolone  cream (KENALOG ) 0.1 % Apply 1 Application topically as needed (prn). As needed 02/06/23   [provider]    Physical Exam    Vital Signs:  Ellen Drake does not have vital signs available for review today.  Given telephonic nature of communication, physical exam is limited. AAOx3. NAD. Normal affect.  Speech and respirations are unlabored.  Accessory Clinical Findings    None  Assessment & Plan    1.  Preoperative Cardiovascular Risk Assessment:  According to the Revised Cardiac Risk Index (RCRI), her Perioperative Risk of Major Cardiac Event is (%): 0.4. Her Functional Capacity in METs is: 5.72 according to the [facility] Activity Status Index (DASI). Therefore, based on ACC/AHA guidelines, patient would be at acceptable risk  for the planned procedure without further cardiovascular testing.  The patient was advised that if she develops new symptoms prior to surgery to contact our office to arrange for a follow-up visit, and she verbalized understanding.  Regarding ASA therapy, we recommend continuation of ASA throughout the perioperative period.  However, if the surgeon feels that cessation of ASA is required in the perioperative period, it may be stopped 5-7 days prior to surgery with a plan to resume it as soon as felt to be feasible from a surgical standpoint in the post-operative period.    A copy of this note will be routed to requesting surgeon.  Time:   Today, I have spent 5 minutes with the patient with telehealth technology discussing medical history, symptoms, and management plan.     Damien JAYSON Braver, NP  08/17/2024, 4:27 PM     [1]  Allergies Allergen Reactions   Mercaptopurine Nausea Only and Other (See  Comments)   Lyrica [Pregabalin]     Hand swelling   Dilaudid  [Hydromorphone  Hcl] Rash   "

## 2024-08-18 NOTE — Progress Notes (Signed)
 Cardiac clearance on chart from Damien Braver NP-Acceptable Risk 08-17-24  Pacemaker form completed and on chart

## 2024-08-20 ENCOUNTER — Other Ambulatory Visit: Payer: Self-pay | Admitting: Internal Medicine

## 2024-08-21 ENCOUNTER — Encounter: Payer: Self-pay | Admitting: Orthopedic Surgery

## 2024-08-21 NOTE — H&P (Signed)
 ORTHOPAEDIC HISTORY & PHYSICAL Ellen Drake, Ellen Drake, [place] - 08/16/2024 1:30 PM EST Formatting of this note is different from the original.  Surgical Admission History & Physical  NAME: Ellen Drake H&P Date: 08/17/2024 Procedure Date: 08/22/2024  Chief Complaint: right knee pain and mechanical symptoms  HPI Ellen Drake is a 79 y.o. female who has severe knee pain and has a history of several failed right knee implants.  She has had chronic right knee pain, swelling, and stiffness for over a decade, with limited range of motion and difficulty with ambulation. She stopped going to the gym about four months ago due to knee symptoms and hopes this revision will provide lasting relief.  Her right knee surgeries include the total knee arthroplasty in 2014 and two revisions in 2016 and 2018 at [facility] . She has completed multiple courses of physical therapy and used various supportive devices and compression stockings with mixed tolerance.  She had a pacemaker placed last year that is functioning well. Recent preoperative EKG, blood work, and urinalysis were reportedly normal. She denies asthma or COPD.  The patient states that the knee pain has progressed to the point that it is significantly interfering with her activities of daily living. She has requested operative intervention for relief of her symptoms.  Social History: Denies smoking, alcohol  and illicit drug use. Medications & Allergies Allergies: Allergies Allergen Reactions Hydromorphone  Hcl Rash Mercaptopurine Nausea Pregabalin Swelling Hand swelling  Home Medicines: Current Outpatient Medications on File Prior to Visit Medication Sig Dispense Refill triamcinolone  0.1 % cream APPLY TWICE DAILY TO RASH UP TO 2 WEEKS/MONTH AS NEEDED. amLODIPine  (NORVASC ) 5 MG tablet Take 10 mg by mouth Take 1 tablet (5 mg total) by mouth daily aspirin  81 mg tablet 1 tab by mouth daily carboxymethylcellulose  (REFRESH TEARS) 0.5 % ophthalmic solution Place 1 drop into both eyes 3 (three) times daily as needed (dry/irritated eyes). carvedilol  (COREG ) 12.5 MG tablet Take 12.5 mg by mouth 2 (two) times daily cholecalciferol 1000 unit tablet Take by mouth clobetasol (TEMOVATE) 0.05 % ointment Apply 1 Application topically 2 (two) times daily as needed (skin irritation.). cyanocobalamin  (VITAMIN B12) 1000 MCG tablet Take 1,000 mcg by mouth once daily folic acid  (FOLVITE ) 1 MG tablet Take 1 tablet (1 mg total) by mouth once daily 90 tablet 3 hydroCHLOROthiazide  (MICROZIDE ) 12.5 mg capsule Take 12.5 mg by mouth once daily HYDROcodone -acetaminophen  (NORCO) 5-325 mg tablet Take 1 tablet by mouth every 6 (six) hours as needed for Pain (Patient not taking: Reported on 08/16/2024) ketorolac  (ACULAR ) 0.5 % ophthalmic solution Place 1 drop into both eyes 4 (four) times daily latanoprost  (XALATAN ) 0.005 % ophthalmic solution Place 1 drop into both eyes nightly. potassium chloride  (KLOR-CON  M10) 10 mEq ER tablet Take 20 mEq by mouth 2 (two) times daily rosuvastatin  (CRESTOR ) 10 MG tablet Take 10 mg by mouth once daily (Patient not taking: Reported on 08/16/2024) sulfaSALAzine  (AZULFIDINE ) 500 mg tablet TAKE 3 TABLETS TWICE DAILY 540 tablet 3 timolol  maleate (TIMOPTIC ) 0.5 % ophthalmic solution Place 1 drop into both eyes daily. traMADoL  (ULTRAM ) 50 mg tablet  No current facility-administered medications on file prior to visit.  Medical / Surgical History  Past Medical History: Diagnosis Date Anemia reslved Colon polyp 2009 Crohn's disease (CMS/HHS-HCC) Diverticulosis Fibrocystic breast disease Herniated nucleus pulposis of lumbosacral region History of chickenpox Hyperlipidemia Hypertension Osteoarthritis Pulmonary nodule seen on imaging study 2004, resolved 2007   Past Surgical History: Procedure Laterality Date POSTERIOR LAMINECTOMY / DECOMPRESSION CERVICAL SPINE  1998 POSTERIOR LAMINECTOMY /  DECOMPRESSION LUMBAR SPINE 1999 REPAIR ROTATOR CUFF TEAR ACUTE OPEN Left 2007 accident COLONOSCOPY 2009 COLONOSCOPY W/BIOPSY 11/30/2012 Procedure: COLONOSCOPY W/BIOPSY; Surgeon: Elijah DELENA SHAUNNA Tanda, MD; Location: [facility]; Service: Gastroenterology;; Right knee medical compartment MAKOplasty 08/01/2013 Dr VEAR Glenn Arthroscopic limited synovectomy, right knee 03/10/2014 Dr VEAR Glenn REVISION TOTAL KNEE ARTHROPLASTY Right 08/2014 Dr. Elgin Bushman COLONOSCOPY W/BIOPSY N/A 04/15/2016 Procedure: Colonoscopy ; Surgeon: Elijah DELENA SHAUNNA Tanda, MD; Location: [facility]; Service: Gastroenterology; Laterality: N/A; COLONOSCOPY W/REMOVAL LESIONS BY SNARE N/A 04/26/2019 Procedure: COLORECTAL/HIGH RISK; Surgeon: Tanda Elijah DELENA SHAUNNA, MD; Location: [facility]; Service: Gastroenterology; Laterality: N/A; COLONOSCOPY W/BIOPSY N/A 04/26/2019 Procedure: COLONOSCOPY, FLEXIBLE; WITH BIOPSY, SINGLE OR MULTIPLE; Surgeon: Tanda Elijah DELENA SHAUNNA, MD; Location: [facility]; Service: Gastroenterology; Laterality: N/A; BACK SURGERY Colectomy 2003 COLON SURGERY   Physical Exam  Ht:165.1 cm (5' 5) Wt:66.8 kg (147 lb 3.2 oz) BMI: Body mass index is 24.5 kg/m. Vitals: 08/16/24 1359 BP: (!) 140/72 Weight: 66.8 kg (147 lb 3.2 oz) Height: 165.1 cm (5' 5) PainSc: 10-Worst pain ever PainLoc: Knee   General/Constitutional: No apparent distress: well-nourished and well developed. Eyes: Pupils equal, round with synchronous movement. Lymphatic: No palpable axillary adenopathy. Respiratory: Non-labored breathing. Lungs clear to auscultation. No abnormal breath sounds. Cardiac: RRR. Pacemaker present. Vascular: No edema, swelling or tenderness, except as noted in detailed exam. Integumentary: No impressive skin lesions present, except as noted in detailed exam. Neuro/Psych: Normal mood and affect, oriented to person, place and time. Musculoskeletal:  Right knee  exam Soft tissue swelling: moderate Effusion: mild Erythema: none Crepitance: minimal Tenderness: medial, lateral, anterior Alignment: relative valgus Mediolateral laxity: stable Patellar tracking: Good tracking without evidence of subluxation or tilt Atrophy: Generalized quadriceps atrophy. Quadriceps tone was fair to good. Range of motion: 0/4/68 degrees  Imaging I ordered and interpreted standing AP, lateral, and sunrise views of the right knee that were obtained in the office today. Cement stemmed revision femoral and tibial components in place. Radiolucency at the bone cement interface of the anterior femoral component is present. No evidence of polyethylene wear or osteolysis. No evidence of fracture or dislocation.  Assesment and Plan Chronic knee pain after replacement of right knee joint  Treatment options were discussed today with the patient. Patient will stop taking vitamins and supplements 7 days prior to surgery. The patient is instructed on the risk and benefits of a revision right total knee arthroplasty with Dr. Mardee on 08/22/2024. The patient was instructed on the risk and benefits of surgical intervention and wishes proceed at this time. This document will serve as a surgical history and physical for the patient. The patient will follow-up per standard postop protocol. They can call the clinic they have any questions, new symptoms develop or symptoms worsen.  The procedure was discussed with the patient, as were the potential risks (including bleeding, infection, nerve and/or blood vessel injury, persistent or recurrent pain, failure of the repair, progression of arthritis, need for further surgery, blood clots, strokes, heart attacks and/or arhythmias, pneumonia, etc.) and benefits. The patient states his understanding and wishes to proceed.  Ellen VOLPINI Ellen DE JAYSON DUNK, PA   Electronically signed by Ellen Drake, Ellen Volpini, PA at  08/17/2024 7:56 AM EST

## 2024-08-22 ENCOUNTER — Other Ambulatory Visit: Payer: Self-pay

## 2024-08-22 ENCOUNTER — Inpatient Hospital Stay

## 2024-08-22 ENCOUNTER — Encounter: Admission: RE | Disposition: A | Payer: Self-pay | Source: Home / Self Care | Attending: Orthopedic Surgery

## 2024-08-22 ENCOUNTER — Encounter: Payer: Self-pay | Admitting: Orthopedic Surgery

## 2024-08-22 ENCOUNTER — Ambulatory Visit (INDEPENDENT_AMBULATORY_CARE_PROVIDER_SITE_OTHER): Admitting: Vascular Surgery

## 2024-08-22 ENCOUNTER — Ambulatory Visit: Admitting: Anesthesiology

## 2024-08-22 ENCOUNTER — Inpatient Hospital Stay
Admission: RE | Admit: 2024-08-22 | Discharge: 2024-08-24 | DRG: 467 | Disposition: A | Attending: Orthopedic Surgery | Admitting: Orthopedic Surgery

## 2024-08-22 DIAGNOSIS — T84032A Mechanical loosening of internal right knee prosthetic joint, initial encounter: Secondary | ICD-10-CM | POA: Diagnosis present

## 2024-08-22 DIAGNOSIS — K5790 Diverticulosis of intestine, part unspecified, without perforation or abscess without bleeding: Secondary | ICD-10-CM | POA: Diagnosis present

## 2024-08-22 DIAGNOSIS — Z7409 Other reduced mobility: Secondary | ICD-10-CM | POA: Diagnosis present

## 2024-08-22 DIAGNOSIS — M25461 Effusion, right knee: Secondary | ICD-10-CM | POA: Diagnosis present

## 2024-08-22 DIAGNOSIS — Z860100 Personal history of colon polyps, unspecified: Secondary | ICD-10-CM | POA: Diagnosis not present

## 2024-08-22 DIAGNOSIS — Z79899 Other long term (current) drug therapy: Secondary | ICD-10-CM

## 2024-08-22 DIAGNOSIS — Z8619 Personal history of other infectious and parasitic diseases: Secondary | ICD-10-CM | POA: Diagnosis not present

## 2024-08-22 DIAGNOSIS — I10 Essential (primary) hypertension: Secondary | ICD-10-CM | POA: Diagnosis present

## 2024-08-22 DIAGNOSIS — Z9049 Acquired absence of other specified parts of digestive tract: Secondary | ICD-10-CM

## 2024-08-22 DIAGNOSIS — Z950 Presence of cardiac pacemaker: Secondary | ICD-10-CM | POA: Diagnosis not present

## 2024-08-22 DIAGNOSIS — E7800 Pure hypercholesterolemia, unspecified: Secondary | ICD-10-CM | POA: Diagnosis present

## 2024-08-22 DIAGNOSIS — M1990 Unspecified osteoarthritis, unspecified site: Secondary | ICD-10-CM | POA: Diagnosis present

## 2024-08-22 DIAGNOSIS — K5090 Crohn's disease, unspecified, without complications: Secondary | ICD-10-CM | POA: Diagnosis present

## 2024-08-22 DIAGNOSIS — M24661 Ankylosis, right knee: Secondary | ICD-10-CM | POA: Diagnosis present

## 2024-08-22 DIAGNOSIS — Z9181 History of falling: Secondary | ICD-10-CM | POA: Diagnosis not present

## 2024-08-22 DIAGNOSIS — Z981 Arthrodesis status: Secondary | ICD-10-CM | POA: Diagnosis not present

## 2024-08-22 DIAGNOSIS — Z7982 Long term (current) use of aspirin: Secondary | ICD-10-CM | POA: Diagnosis not present

## 2024-08-22 DIAGNOSIS — D649 Anemia, unspecified: Secondary | ICD-10-CM

## 2024-08-22 DIAGNOSIS — I441 Atrioventricular block, second degree: Secondary | ICD-10-CM | POA: Diagnosis present

## 2024-08-22 DIAGNOSIS — Y792 Prosthetic and other implants, materials and accessory orthopedic devices associated with adverse incidents: Secondary | ICD-10-CM | POA: Diagnosis present

## 2024-08-22 DIAGNOSIS — Z96659 Presence of unspecified artificial knee joint: Secondary | ICD-10-CM | POA: Diagnosis present

## 2024-08-22 DIAGNOSIS — T84498A Other mechanical complication of other internal orthopedic devices, implants and grafts, initial encounter: Principal | ICD-10-CM

## 2024-08-22 DIAGNOSIS — Z87891 Personal history of nicotine dependence: Secondary | ICD-10-CM

## 2024-08-22 DIAGNOSIS — T8482XA Fibrosis due to internal orthopedic prosthetic devices, implants and grafts, initial encounter: Secondary | ICD-10-CM | POA: Diagnosis present

## 2024-08-22 DIAGNOSIS — Z888 Allergy status to other drugs, medicaments and biological substances status: Secondary | ICD-10-CM | POA: Diagnosis not present

## 2024-08-22 DIAGNOSIS — Z885 Allergy status to narcotic agent status: Secondary | ICD-10-CM

## 2024-08-22 DIAGNOSIS — G8929 Other chronic pain: Secondary | ICD-10-CM

## 2024-08-22 HISTORY — PX: TOTAL KNEE REVISION: SHX996

## 2024-08-22 LAB — ABO/RH: ABO/RH(D): O POS

## 2024-08-22 MED ORDER — FLEET ENEMA RE ENEM: 1.0000 | ENEMA | Freq: Once | RECTAL | Status: DC | PRN

## 2024-08-22 MED ORDER — HYDROMORPHONE HCL 1 MG/ML IJ SOLN: 0.5000 mg | INTRAMUSCULAR | Status: DC | PRN

## 2024-08-22 MED ORDER — TRAMADOL HCL 50 MG PO TABS: 25.0000 mg | ORAL_TABLET | Freq: Once | ORAL | Status: DC | PRN

## 2024-08-22 MED ORDER — ACETAMINOPHEN 325 MG PO TABS: 325.0000 mg | ORAL_TABLET | Freq: Four times a day (QID) | ORAL | Status: DC | PRN

## 2024-08-22 MED ORDER — ORAL CARE MOUTH RINSE: 15.0000 mL | Freq: Once | OROMUCOSAL | Status: AC

## 2024-08-22 MED ORDER — PROPOFOL 1000 MG/100ML IV EMUL
INTRAVENOUS | Status: AC
  Filled 2024-08-22: qty 100

## 2024-08-22 MED ORDER — ONDANSETRON HCL 4 MG/2ML IJ SOLN
INTRAMUSCULAR | Status: AC
  Filled 2024-08-22: qty 2

## 2024-08-22 MED ORDER — SENNOSIDES-DOCUSATE SODIUM 8.6-50 MG PO TABS
1.0000 | ORAL_TABLET | Freq: Two times a day (BID) | ORAL | Status: DC
  Administered 2024-08-23 – 2024-08-24 (×3): 1 via ORAL
  Filled 2024-08-22 (×3): qty 1

## 2024-08-22 MED ORDER — OXYCODONE HCL 5 MG PO TABS
10.0000 mg | ORAL_TABLET | ORAL | Status: DC | PRN
  Administered 2024-08-23: 10 mg via ORAL
  Filled 2024-08-22: qty 2

## 2024-08-22 MED ORDER — GABAPENTIN 300 MG PO CAPS
300.0000 mg | ORAL_CAPSULE | Freq: Once | ORAL | Status: AC
  Administered 2024-08-22: 300 mg via ORAL

## 2024-08-22 MED ORDER — ONDANSETRON HCL 4 MG PO TABS: 4.0000 mg | ORAL_TABLET | Freq: Four times a day (QID) | ORAL | Status: DC | PRN

## 2024-08-22 MED ORDER — SODIUM CHLORIDE 0.9 % IV SOLN: INTRAVENOUS | Status: DC

## 2024-08-22 MED ORDER — SULFASALAZINE 500 MG PO TBEC
1500.0000 mg | DELAYED_RELEASE_TABLET | Freq: Two times a day (BID) | ORAL | Status: DC
  Administered 2024-08-23 – 2024-08-24 (×3): 1500 mg via ORAL
  Filled 2024-08-22 (×5): qty 3

## 2024-08-22 MED ORDER — ASPIRIN 81 MG PO CHEW
81.0000 mg | CHEWABLE_TABLET | Freq: Two times a day (BID) | ORAL | Status: DC
  Administered 2024-08-23 – 2024-08-24 (×3): 81 mg via ORAL
  Filled 2024-08-22 (×3): qty 1

## 2024-08-22 MED ORDER — SODIUM CHLORIDE 0.9 % IV SOLN
INTRAVENOUS | Status: DC | PRN
  Administered 2024-08-22: 60 mL

## 2024-08-22 MED ORDER — TIMOLOL MALEATE 0.5 % OP SOLN
1.0000 [drp] | Freq: Every morning | OPHTHALMIC | Status: DC
  Administered 2024-08-24: 1 [drp] via OPHTHALMIC
  Filled 2024-08-22 (×2): qty 5

## 2024-08-22 MED ORDER — CELECOXIB 200 MG PO CAPS
200.0000 mg | ORAL_CAPSULE | Freq: Two times a day (BID) | ORAL | Status: DC
  Administered 2024-08-23 – 2024-08-24 (×3): 200 mg via ORAL
  Filled 2024-08-22 (×3): qty 1

## 2024-08-22 MED ORDER — MAGNESIUM HYDROXIDE 400 MG/5ML PO SUSP
30.0000 mL | Freq: Every day | ORAL | Status: DC
  Administered 2024-08-24: 30 mL via ORAL
  Filled 2024-08-22: qty 30

## 2024-08-22 MED ORDER — ACETAMINOPHEN 10 MG/ML IV SOLN: 1000.0000 mg | Freq: Once | INTRAVENOUS | Status: DC | PRN

## 2024-08-22 MED ORDER — ENSURE PRE-SURGERY PO LIQD: 296.0000 mL | Freq: Once | ORAL | Status: DC

## 2024-08-22 MED ORDER — CELECOXIB 200 MG PO CAPS
ORAL_CAPSULE | ORAL | Status: AC
  Filled 2024-08-22: qty 2

## 2024-08-22 MED ORDER — AMLODIPINE BESYLATE 10 MG PO TABS: 10.0000 mg | ORAL_TABLET | Freq: Every day | ORAL | Status: DC

## 2024-08-22 MED ORDER — DIPHENHYDRAMINE HCL 12.5 MG/5ML PO ELIX: 12.5000 mg | ORAL_SOLUTION | ORAL | Status: DC | PRN

## 2024-08-22 MED ORDER — PHENOL 1.4 % MT LIQD: 1.0000 | OROMUCOSAL | Status: DC | PRN

## 2024-08-22 MED ORDER — TRANEXAMIC ACID-NACL 1000-0.7 MG/100ML-% IV SOLN
1000.0000 mg | INTRAVENOUS | Status: AC
  Administered 2024-08-22: 1000 mg via INTRAVENOUS

## 2024-08-22 MED ORDER — BUPIVACAINE HCL (PF) 0.5 % IJ SOLN
INTRAMUSCULAR | Status: DC | PRN
  Administered 2024-08-22: 3 mL via INTRATHECAL

## 2024-08-22 MED ORDER — CARBOXYMETHYLCELLULOSE SODIUM 0.5 % OP SOLN: 1.0000 [drp] | Freq: Three times a day (TID) | OPHTHALMIC | Status: DC | PRN

## 2024-08-22 MED ORDER — CEFAZOLIN SODIUM-DEXTROSE 2-4 GM/100ML-% IV SOLN
2.0000 g | INTRAVENOUS | Status: AC
  Administered 2024-08-22 (×3): 2 g via INTRAVENOUS

## 2024-08-22 MED ORDER — FENTANYL CITRATE (PF) 100 MCG/2ML IJ SOLN
25.0000 ug | INTRAMUSCULAR | Status: DC | PRN
  Administered 2024-08-22: 25 ug via INTRAVENOUS
  Administered 2024-08-22: 50 ug via INTRAVENOUS
  Administered 2024-08-22: 25 ug via INTRAVENOUS

## 2024-08-22 MED ORDER — CEFAZOLIN SODIUM-DEXTROSE 2-4 GM/100ML-% IV SOLN
2.0000 g | Freq: Four times a day (QID) | INTRAVENOUS | Status: AC
  Administered 2024-08-23: 2 g via INTRAVENOUS
  Filled 2024-08-22: qty 100

## 2024-08-22 MED ORDER — BISACODYL 10 MG RE SUPP: 10.0000 mg | Freq: Every day | RECTAL | Status: DC | PRN

## 2024-08-22 MED ORDER — TRANEXAMIC ACID-NACL 1000-0.7 MG/100ML-% IV SOLN
INTRAVENOUS | Status: AC
  Filled 2024-08-22: qty 100

## 2024-08-22 MED ORDER — PHENYLEPHRINE HCL-NACL 20-0.9 MG/250ML-% IV SOLN
INTRAVENOUS | Status: DC | PRN
  Administered 2024-08-22: 50 ug/min via INTRAVENOUS

## 2024-08-22 MED ORDER — DEXMEDETOMIDINE HCL IN NACL 80 MCG/20ML IV SOLN
INTRAVENOUS | Status: DC | PRN
  Administered 2024-08-22: 4 ug via INTRAVENOUS
  Administered 2024-08-22 (×2): 8 ug via INTRAVENOUS
  Administered 2024-08-22 (×2): 4 ug via INTRAVENOUS
  Administered 2024-08-22: 12 ug via INTRAVENOUS
  Administered 2024-08-22 (×3): 4 ug via INTRAVENOUS

## 2024-08-22 MED ORDER — GABAPENTIN 300 MG PO CAPS
ORAL_CAPSULE | ORAL | Status: AC
  Filled 2024-08-22: qty 1

## 2024-08-22 MED ORDER — DROPERIDOL 2.5 MG/ML IJ SOLN: 0.6250 mg | Freq: Once | INTRAMUSCULAR | Status: DC | PRN

## 2024-08-22 MED ORDER — ACETAMINOPHEN 10 MG/ML IV SOLN
1000.0000 mg | Freq: Four times a day (QID) | INTRAVENOUS | Status: AC
  Administered 2024-08-23 (×4): 1000 mg via INTRAVENOUS
  Filled 2024-08-22 (×4): qty 100

## 2024-08-22 MED ORDER — EPHEDRINE 5 MG/ML INJ
INTRAVENOUS | Status: AC
  Filled 2024-08-22: qty 5

## 2024-08-22 MED ORDER — CEFAZOLIN SODIUM 1 G IJ SOLR
INTRAMUSCULAR | Status: AC
  Filled 2024-08-22: qty 20

## 2024-08-22 MED ORDER — LATANOPROST 0.005 % OP SOLN
1.0000 [drp] | Freq: Every day | OPHTHALMIC | Status: DC
  Filled 2024-08-22: qty 2.5

## 2024-08-22 MED ORDER — FENTANYL CITRATE (PF) 100 MCG/2ML IJ SOLN
INTRAMUSCULAR | Status: AC
  Filled 2024-08-22: qty 2

## 2024-08-22 MED ORDER — FERROUS SULFATE 325 (65 FE) MG PO TABS
325.0000 mg | ORAL_TABLET | Freq: Two times a day (BID) | ORAL | Status: DC
  Administered 2024-08-23 – 2024-08-24 (×3): 325 mg via ORAL
  Filled 2024-08-22 (×3): qty 1

## 2024-08-22 MED ORDER — PANTOPRAZOLE SODIUM 40 MG PO TBEC
40.0000 mg | DELAYED_RELEASE_TABLET | Freq: Two times a day (BID) | ORAL | Status: DC
  Administered 2024-08-23 – 2024-08-24 (×3): 40 mg via ORAL
  Filled 2024-08-22 (×3): qty 1

## 2024-08-22 MED ORDER — FOLIC ACID 1 MG PO TABS
1.0000 mg | ORAL_TABLET | Freq: Every morning | ORAL | Status: DC
  Administered 2024-08-23 – 2024-08-24 (×2): 1 mg via ORAL
  Filled 2024-08-22 (×2): qty 1

## 2024-08-22 MED ORDER — MENTHOL 3 MG MT LOZG: 1.0000 | LOZENGE | OROMUCOSAL | Status: DC | PRN

## 2024-08-22 MED ORDER — POTASSIUM CHLORIDE CRYS ER 20 MEQ PO TBCR
20.0000 meq | EXTENDED_RELEASE_TABLET | Freq: Two times a day (BID) | ORAL | Status: DC
  Administered 2024-08-23 – 2024-08-24 (×3): 20 meq via ORAL
  Filled 2024-08-22 (×3): qty 1

## 2024-08-22 MED ORDER — METOCLOPRAMIDE HCL 10 MG PO TABS
10.0000 mg | ORAL_TABLET | Freq: Three times a day (TID) | ORAL | Status: DC
  Administered 2024-08-23 – 2024-08-24 (×6): 10 mg via ORAL
  Filled 2024-08-22 (×6): qty 1

## 2024-08-22 MED ORDER — PHENYLEPHRINE HCL-NACL 20-0.9 MG/250ML-% IV SOLN
INTRAVENOUS | Status: AC
  Filled 2024-08-22: qty 250

## 2024-08-22 MED ORDER — SODIUM CHLORIDE 0.9 % IR SOLN
Status: DC | PRN
  Administered 2024-08-22: 3000 mL

## 2024-08-22 MED ORDER — LACTATED RINGERS IV SOLN: INTRAVENOUS | Status: DC

## 2024-08-22 MED ORDER — CEFAZOLIN SODIUM-DEXTROSE 2-4 GM/100ML-% IV SOLN
INTRAVENOUS | Status: AC
  Filled 2024-08-22: qty 100

## 2024-08-22 MED ORDER — CHLORHEXIDINE GLUCONATE 0.12 % MT SOLN
OROMUCOSAL | Status: AC
  Filled 2024-08-22: qty 15

## 2024-08-22 MED ORDER — TRAMADOL HCL 50 MG PO TABS
50.0000 mg | ORAL_TABLET | Freq: Four times a day (QID) | ORAL | Status: DC | PRN
  Administered 2024-08-23: 50 mg via ORAL
  Administered 2024-08-24: 100 mg via ORAL
  Filled 2024-08-22: qty 2
  Filled 2024-08-22: qty 1

## 2024-08-22 MED ORDER — OXYCODONE HCL 5 MG PO TABS
5.0000 mg | ORAL_TABLET | ORAL | Status: DC | PRN
  Administered 2024-08-23: 5 mg via ORAL
  Filled 2024-08-22: qty 1

## 2024-08-22 MED ORDER — POLYVINYL ALCOHOL 1.4 % OP SOLN: 1.0000 [drp] | Freq: Three times a day (TID) | OPHTHALMIC | Status: DC | PRN

## 2024-08-22 MED ORDER — CELECOXIB 200 MG PO CAPS
400.0000 mg | ORAL_CAPSULE | Freq: Once | ORAL | Status: AC
  Administered 2024-08-22: 400 mg via ORAL

## 2024-08-22 MED ORDER — ONDANSETRON HCL 4 MG/2ML IJ SOLN
INTRAMUSCULAR | Status: DC | PRN
  Administered 2024-08-22: 4 mg via INTRAVENOUS

## 2024-08-22 MED ORDER — CARVEDILOL 3.125 MG PO TABS
12.5000 mg | ORAL_TABLET | Freq: Two times a day (BID) | ORAL | Status: DC
  Administered 2024-08-23 – 2024-08-24 (×3): 12.5 mg via ORAL
  Filled 2024-08-22 (×3): qty 4

## 2024-08-22 MED ORDER — DEXAMETHASONE SOD PHOSPHATE PF 10 MG/ML IJ SOLN
INTRAMUSCULAR | Status: AC
  Filled 2024-08-22: qty 1

## 2024-08-22 MED ORDER — HYDROCHLOROTHIAZIDE 12.5 MG PO TABS
12.5000 mg | ORAL_TABLET | Freq: Every day | ORAL | Status: DC
  Filled 2024-08-22 (×2): qty 1

## 2024-08-22 MED ORDER — TRANEXAMIC ACID-NACL 1000-0.7 MG/100ML-% IV SOLN
1000.0000 mg | Freq: Once | INTRAVENOUS | Status: AC
  Administered 2024-08-22: 1000 mg via INTRAVENOUS

## 2024-08-22 MED ORDER — SURGIPHOR WOUND IRRIGATION SYSTEM - OPTIME
TOPICAL | Status: DC | PRN
  Administered 2024-08-22: 450 mL via TOPICAL

## 2024-08-22 MED ORDER — CHLORHEXIDINE GLUCONATE 0.12 % MT SOLN
15.0000 mL | Freq: Once | OROMUCOSAL | Status: AC
  Administered 2024-08-22: 15 mL via OROMUCOSAL

## 2024-08-22 MED ORDER — DEXAMETHASONE SOD PHOSPHATE PF 10 MG/ML IJ SOLN
8.0000 mg | Freq: Once | INTRAMUSCULAR | Status: AC
  Administered 2024-08-22: 8 mg via INTRAVENOUS

## 2024-08-22 MED ORDER — PROPOFOL 500 MG/50ML IV EMUL
INTRAVENOUS | Status: DC | PRN
  Administered 2024-08-22: 50 ug/kg/min via INTRAVENOUS

## 2024-08-22 MED ORDER — BUPIVACAINE HCL 0.25 % IJ SOLN
INTRAMUSCULAR | Status: DC | PRN
  Administered 2024-08-22: 60 mL

## 2024-08-22 MED ORDER — ALUM & MAG HYDROXIDE-SIMETH 200-200-20 MG/5ML PO SUSP: 30.0000 mL | ORAL | Status: DC | PRN

## 2024-08-22 MED ORDER — CHLORHEXIDINE GLUCONATE 4 % EX SOLN
60.0000 mL | Freq: Once | CUTANEOUS | Status: AC
  Administered 2024-08-22: 4 via TOPICAL

## 2024-08-22 MED ORDER — EPHEDRINE SULFATE-NACL 50-0.9 MG/10ML-% IV SOSY
PREFILLED_SYRINGE | INTRAVENOUS | Status: DC | PRN
  Administered 2024-08-22: 10 mg via INTRAVENOUS
  Administered 2024-08-22: 5 mg via INTRAVENOUS
  Administered 2024-08-22: 10 mg via INTRAVENOUS

## 2024-08-22 MED ORDER — ONDANSETRON HCL 4 MG/2ML IJ SOLN: 4.0000 mg | Freq: Four times a day (QID) | INTRAMUSCULAR | Status: DC | PRN

## 2024-08-22 SURGICAL SUPPLY — 1 items: CEMENT BONE GENTAMICIN 40 (Cement) IMPLANT

## 2024-08-22 NOTE — Anesthesia Procedure Notes (Addendum)
Combined Spinal Epidural

## 2024-08-22 NOTE — Progress Notes (Signed)
 Patient has mobility impairment for daily activities. A rolling walker will resolve this and the patient is safe to use it  [employer]. Myrtle Haller, Jr., M.D.

## 2024-08-22 NOTE — OR Nursing (Signed)
 Radiologist  Dr. Ozell Daring confirmed  to Dr.Hooten no retained products in the knee only the arthroplasty knee implants.

## 2024-08-22 NOTE — Progress Notes (Signed)
 Patient is not able to walk the distance required to go the bathroom, or he/she is unable to safely negotiate stairs required to access the bathroom.  A 3in1 BSC will alleviate this problem   Ellen Drake P. Angie Fava M.D.

## 2024-08-22 NOTE — Op Note (Signed)
 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 M.D.  ASSISTANT:  Vick Dunk, PA-C (present and scrubbed throughout the case, critical for assistance with exposure, retraction, instrumentation, and closure)  ANESTHESIA: spinal  ESTIMATED BLOOD LOSS: 250 mL  FLUIDS REPLACED: 1300 mL of crystalloid  TOURNIQUET TIME: #1 - 100 minutes #2 - 53 minutes  DRAINS: 2 medium Hemovac drains  IMPLANTS UTILIZED: DePuy Attune size 6 revision CRS femoral component (cemented), 30 mm fully coated revision femoral sleeve, 12 mm x 60 mm press-fit stem, 4 mm distal femoral augments medial and lateral, 4 mm posterior lateral augment, 8 mm posterior medial augment, size 6 revision tibial base rotating platform, 29 mm fully coated revision tibial sleeve, 10 mm x 110 mm press-fit stem, and a 6 mm revision CRS rotating platform polyethylene insert   INDICATIONS FOR SURGERY: Ellen Drake is a 79 y.o. year old female who underwent a right total knee revision arthroplasty approximately 10 years ago as per Dr. Elgin Jama Edison.  The patient had progressive pain and stiffness to the left knee.  Previous bone scan demonstrated increased uptake consistent with loosening.  C-reactive protein and sedimentation rate were within normal limits and an aspiration of the knee demonstrated no growth.  She demonstrated severe restriction with her range of motion with only 79 degrees of flexion. The patient had not seen any significant improvement despite conservative nonsurgical intervention. After discussion of the risks and benefits of surgical intervention, the patient expressed understanding of the risks benefits and  agree with plans for total knee revision arthroplasty.   The risks, benefits, and alternatives were discussed at length including but not limited to the risks of infection, bleeding, nerve injury, stiffness, blood clots, the need for revision surgery, cardiopulmonary complications, among others, and they were willing to proceed.  PROCEDURE IN DETAIL: The patient was brought into the operating room and, after adequate spinal anesthesia was achieved, a tourniquet was placed on the patient's upper thigh. The patient's knee and leg were cleaned and prepped with alcohol  and DuraPrep and draped in the usual sterile fashion. A timeout was performed as per usual protocol. The lower extremity was exsanguinated using an Esmarch, and the tourniquet was inflated to 300 mmHg. An anterior longitudinal incision was made followed by a standard medial parapatellar approach. The deep fibers of the medial collateral ligament were elevated in a subperiosteal fashion off of the medial flare of the tibia so as to maintain a continuous soft tissue sleeve.  Dense fibrotic tissue was encountered in the medial and lateral gutters as well as on the suprapatellar region.  An extensive synovectomy and lysis of adhesions was performed using electrocautery.  This allowed for restoration of the medial lateral gutters.  Exposure was still difficult so it was elected to perform a quadriceps snip to better mobilize the patella.  The total knee implants were visualized.  There was a locking screw for the polyethylene insert and this was eventually removed with subsequent removal of the polyethylene insert.  Dense fibrotic tissue from the posterior recesses was sharply excised using electrocautery.  The patella was subluxed laterally dense fibrotic tissue was excised from around the patellar component.  The polyethylene was in  excellent condition and was noted to be stable.  Attention was then directed to the femoral component.  A TPS saw was  used to broach the interface between the implant and cement.  This was continued around the full extent of the femoral component with thin osteotomes used to address the posterior flanges.  Tourniquet was deflated after initial tourniquet time of 100 minutes.  Hemostasis was achieved using electrocautery.  Minimal bone loss was appreciated.  Attention was then directed to the tibial component.  TPS saw was also used to broach the implant cement interface.  Angled osteotomes and back cutting osteotomes were also used to broach the interface.  The Shukla tibial extractor was attached and the implant was removed intact.  Significant cement mantles were noted to both the femoral and tibial canals.  The Oscar ultrasound device was used to debride the cement residue from the femoral and tibial canals.  Femoral and tibial canals were then reamed in a sequential fashion with the femoral canal reamed to 12 mm diameter and the tibial canal reamed to 10 mm.  A 29 mm tibial broach was used to prepare the proximal tibia and to allow for a 110 mm stem.  A cleanup cut was performed in the proximal tibia size and felt that a size 6 plateau was appropriate.  Next, attention was directed to the femur.  A 30 mm femoral broach was inserted with good fixation appreciated.  The femur was sized and was felt that a size 6 femoral component was appropriate.  Distal cleanup cut was performed.  It was felt that 4 mm distal augments were appropriate both medially and laterally.  The central box cut was performed.  Cuts were performed so as to allow for a 4 mm posterior lateral augment and an 8 mm posterior medial augment.  Trial inserts were placed followed by insertion of a 6 mm polyethylene trial.  This allowed for full extension and greater than 100 degrees of flexion.  Good patellar tracking was appreciated.  The right lower extremity was exsanguinated a second time and the tourniquet was reinflated to 300 mmHg.  Trial components were  removed.  Cut surfaces of bone were irrigated with copious amounts of normal saline using pulsatile lavage and then suctioned dry.  Femoral and tibial constructs as noted above were created on the back table.  Polymethylmethacrylate cement with gentamicin  was prepared in the usual fashion using a vacuum mixer. Cement was applied to the cut surface of the proximal tibia and the tibial component was positioned and impacted into place. Excess cement was removed using Personal assistant. Cement was then applied to the cut surfaces of the femur as well as along the posterior flanges of the size femoral component. The femoral component was positioned and impacted into place. Excess cement was removed using Personal assistant. A 6 mm polyethylene trial was inserted and the knee was brought into full extension with steady axial compression applied. After adequate curing of the cement, the tourniquet was deflated after a second tourniquet time of 53 minutes. Hemostasis was achieved using electrocautery. The knee was irrigated with copious amounts of normal saline using pulsatile lavage followed by 450 ml of Surgiphor and then suctioned dry. 20 mL of 1.3% Exparel  and 60 mL of 0.25% Marcaine  in 40 mL of normal saline was injected along the posterior capsule, medial and lateral gutters, and along the arthrotomy site. A 6 mm CRS rotating platform polyethylene insert was inserted and the knee was placed through a  range of motion with excellent mediolateral soft tissue balancing appreciated and excellent patellar tracking noted. 2 medium drains were placed in the wound bed and brought out through separate stab incisions. The medial parapatellar portion of the incision was reapproximated using interrupted sutures of #1 Vicryl. Subcutaneous tissue was approximated in layers using first #0 Vicryl followed #2-0 Vicryl. The skin was approximated with skin staples. A sterile dressing was applied.  The patient tolerated the procedure well  and was transported to the recovery room in stable condition.    Rahn Lacuesta P. Chukwudi Ewen, Jr., M.D.

## 2024-08-22 NOTE — Interval H&P Note (Signed)
 History and Physical Interval Note:  08/22/2024 10:58 AM  Ellen Drake  has presented today for surgery, with the diagnosis of Effusion of right knee M25.461 Chronic knee pain after total replacement of right knee joint M25.561, G89.29, Z96.651 History of revision of total knee arthroplasty Z96.659.  The various methods of treatment have been discussed with the patient and family. After consideration of risks, benefits and other options for treatment, the patient has consented to  Procedures: TOTAL KNEE REVISION (Right) as a surgical intervention.  The patient's history has been reviewed, patient examined, no change in status, stable for surgery.  I have reviewed the patient's chart and labs.  Questions were answered to the patient's satisfaction.     Faren Florence P Gael Delude

## 2024-08-22 NOTE — Anesthesia Preprocedure Evaluation (Addendum)
 "                                  Anesthesia Evaluation  Patient identified by MRN, date of birth, ID band Patient awake    Reviewed: Allergy & Precautions, H&P , NPO status , Patient's Chart, lab work & pertinent test results  Airway Mallampati: III  TM Distance: >3 FB Neck ROM: full    Dental  (+) Implants   Pulmonary former smoker   Pulmonary exam normal        Cardiovascular hypertension, Normal cardiovascular exam+ dysrhythmias (high-grade AV block s/p PPM) + pacemaker (interrogation 11/25)      Neuro/Psych negative neurological ROS  negative psych ROS   GI/Hepatic Neg liver ROS,,,Crohn's disease   Endo/Other  negative endocrine ROS    Renal/GU      Musculoskeletal  (+) Arthritis ,    Abdominal Normal abdominal exam  (+)   Peds  Hematology negative hematology ROS (+)   Anesthesia Other Findings Past Medical History: No date: Anemia     Comment:  reslved 2009: Colon polyp No date: Crohn's disease (HCC) No date: Diverticulosis No date: Fibrocystic breast disease No date: Grade I diastolic dysfunction No date: Herniated nucleus pulposus of lumbosacral region No date: History of chicken pox No date: Hypercholesterolemia No date: Hyperlipidemia No date: Hypertension No date: Mild aortic stenosis by prior echocardiogram No date: Mild mitral regurgitation by prior echocardiogram No date: Osteoarthritis No date: Pacemaker 2004: Pulmonary nodule seen on imaging study     Comment:  resolved 2007 No date: Second degree AV block, Mobitz type II  Past Surgical History: 1998: anterior cervical discectomy and fusion     Comment:  Dr Drury 1992: BACK SURGERY     Comment:  ruptured disc (Dr Kathi) No date: BREAST BIOPSY; Left     Comment:  neg-no scar seen 02/09/2019: CATARACT EXTRACTION W/PHACO; Right     Comment:  Procedure: CATARACT EXTRACTION PHACO AND INTRAOCULAR               LENS PLACEMENT (IOC) KAHOOK DUAL BLADE GONIOTOMY  RIGHT                HEALON 5, VISION BLUE;  Surgeon: Mittie Gaskin,               MD;  Location: [facility];  Service:               Ophthalmology;  Laterality: Right; 11/11/2023: CATARACT EXTRACTION W/PHACO; Left     Comment:  Procedure: PHACOEMULSIFICATION, CATARACT, WITH IOL               INSERTION 11.75 01:03.7;  Surgeon: Mittie Gaskin,               MD;  Location: [facility];  Service:               Ophthalmology;  Laterality: Left; 2003: COLECTOMY 1998: COLON SURGERY 2009: COLONOSCOPY 11/30/2012: COLONOSCOPY W/ BIOPSIES     Comment:  Procedure: COLONOSCOPY W/BIOPSY; Surgeon: Elijah DELENA SHAUNNA Tanda, MD; Location: [facility]; Service:               Gastroenterology;; 04/15/2016: COLONOSCOPY W/ BIOPSIES     Comment:  Procedure: Colonoscopy ; Surgeon: Elijah DELENA SHAUNNA Tanda, MD;  Location: [facility]; Service:               Gastroenterology; Laterality: N/A; 03/10/2014: KNEE ARTHROSCOPY W/ SYNOVECTOMY; Right     Comment:  limited 1/16: KNEE SURGERY; Right     Comment:  [facility] (Dr. Durene) 03/19/2023: PACEMAKER IMPLANT; N/A     Comment:  Procedure: PACEMAKER IMPLANT;  Surgeon: Inocencio Soyla Lunger, MD;  Location: [facility];  Service:               Cardiovascular;  Laterality: N/A; 1999: POSTERIOR LAMINECTOMY / DECOMPRESSION LUMBAR SPINE No date: REPLACEMENT TOTAL KNEE 2007: ROTATOR CUFF REPAIR; Left     Comment:  acute open; accident     Reproductive/Obstetrics negative OB ROS                              Anesthesia Physical Anesthesia Plan  ASA: 3  Anesthesia Plan: Spinal   Post-op Pain Management: Regional block* and Ofirmev  IV (intra-op)*   Induction: Intravenous  PONV Risk Score and Plan: Propofol  infusion  Airway Management Planned: Natural Airway  Additional Equipment:   Intra-op Plan:   Post-operative Plan:   Informed Consent: I have  reviewed the patients History and Physical, chart, labs and discussed the procedure including the risks, benefits and alternatives for the proposed anesthesia with the patient or authorized representative who has indicated his/her understanding and acceptance.     Dental Advisory Given  Plan Discussed with: CRNA and Surgeon  Anesthesia Plan Comments:          Anesthesia Quick Evaluation  "

## 2024-08-22 NOTE — Transfer of Care (Signed)
 Immediate Anesthesia Transfer of Care Note  Patient: Ellen Drake  Procedure(s) Performed: TOTAL KNEE REVISION (Right: Knee)  Patient Location: PACU  Anesthesia Type:General and Spinal  Level of Consciousness: drowsy  Airway & Oxygen Therapy: Patient Spontanous Breathing and Patient connected to nasal cannula oxygen  Post-op Assessment: Report given to RN and Post -op Vital signs reviewed and stable  Post vital signs: Reviewed and stable  Last Vitals:  Vitals Value Taken Time  BP 108/62 08/22/24 21:45  Temp    Pulse 72 08/22/24 21:47  Resp 17 08/22/24 21:47  SpO2 95 % 08/22/24 21:47  Vitals shown include unfiled device data.  Last Pain:  Vitals:   08/22/24 0937  TempSrc: Temporal  PainSc: 8          Complications: No notable events documented.

## 2024-08-22 NOTE — Anesthesia Procedure Notes (Signed)
 Spinal  Patient location during procedure: OR Start time: 08/22/2024 12:14 PM End time: 08/22/2024 12:24 PM Reason for block: surgical anesthesia  Staffing Performed: resident/CRNA and anesthesiologist  Authorized by: Vicci Camellia Glatter, MD   Performed by: Vicci Camellia Glatter, MD  Preanesthetic Checklist Completed: patient identified, IV checked, site marked, risks and benefits discussed, surgical consent, monitors and equipment checked, pre-op  evaluation and timeout performed Spinal Block Patient position: sitting Prep: DuraPrep Patient monitoring: heart rate, cardiac monitor, continuous pulse ox and blood pressure Approach: midline Location: L3-4 Injection technique: single-shot Needle Needle type: Pencan  Needle gauge: 24 G Needle length: 9 cm Assessment Sensory level: T10 Events: CSF return and second provider  Additional Notes Pt with likely scoliosis as a midline approach caused left back pain. CSE was slow flowing with no csf on aspiration. Injection was made. Pt had slight paresthesia with injection in right leg that improved quickly.

## 2024-08-22 NOTE — Anesthesia Procedure Notes (Deleted)
 Spinal  Patient location during procedure: OR Start time: 08/22/2024 12:05 PM End time: 08/22/2024 12:18 PM Reason for block: surgical anesthesia  Staffing Authorized by: Vicci Camellia Glatter, MD   Performed by: Governor Selinda NOVAK, CRNA  Preanesthetic Checklist Completed: patient identified, IV checked, site marked, risks and benefits discussed, surgical consent, monitors and equipment checked, pre-op  evaluation and timeout performed Spinal Block Patient position: sitting Prep: DuraPrep Patient monitoring: heart rate, cardiac monitor, continuous pulse ox and blood pressure Approach: midline Location: L3-4 Injection technique: single-shot Needle Needle type: Pencan  Needle gauge: 24 G Needle length: 9 cm Assessment Sensory level: T10 Events: CSF return  Additional Notes 2 attempts by JINNY Governor, CRNA at L3-4 1 attempt by Dr Vicci at L4-5 with success

## 2024-08-23 NOTE — Progress Notes (Signed)
 Per PA Juliana okay to hold PO amlodipine  and hydrochlorothiazide . Pt's BP is 107/65 and HR is 87.

## 2024-08-23 NOTE — Anesthesia Postprocedure Evaluation (Signed)
"   Anesthesia Post Note  Patient: Ellen Drake  Procedure(s) Performed: TOTAL KNEE REVISION (Right: Knee)  Patient location during evaluation: Short Stay Anesthesia Type: Spinal Level of consciousness: oriented and awake and alert Pain management: pain level controlled Vital Signs Assessment: post-procedure vital signs reviewed and stable Respiratory status: spontaneous breathing, respiratory function stable and patient connected to nasal cannula oxygen Cardiovascular status: blood pressure returned to baseline and stable Postop Assessment: no headache, no backache and no apparent nausea or vomiting Anesthetic complications: no   No notable events documented.   Last Vitals:  Vitals:   08/22/24 2310 08/23/24 0500  BP: (!) 104/56 110/60  Pulse: 68 64  Resp: 14 14  Temp:  36.6 C  SpO2: 93% 94%    Last Pain:  Vitals:   08/23/24 0500  TempSrc: Temporal  PainSc:                  [name]      "

## 2024-08-23 NOTE — Plan of Care (Signed)
   Problem: Education: Goal: Knowledge of General Education information will improve Description Including pain rating scale, medication(s)/side effects and non-pharmacologic comfort measures Outcome: Progressing

## 2024-08-23 NOTE — Progress Notes (Signed)
 Physical Therapy Treatment Patient Details Name: Ellen Drake MRN: [id number] DOB: 11/20/1945 Today's Date: 08/23/2024   History of Present Illness Pt is a 79 yo female s/p R TKA revision. PMH of smoking, Crohns disease, HTN, pacemaker, cervical surgery, rotator cuff repair.    PT Comments  Pt was ambulating to BR with RN staff upon arrival. She is A and O x 4. Endorses severe pain however BP has been soft and was encouraged to participate in activity as tolerated. She demonstrated safe abilities to stand to/from RW and tolerated ambulation > 100 ft without LOB or safety concerns. Slow gait cadence but steady. Progress from antalgic step to pattern to slow step through pattern observed.  ROM seems to be progressing well with pt flexing knee to ~ 95 degrees when stand > sit perform.Pt's BP after ambulation 99/55 (67) with no symptoms of dizziness or lightheadedness observed. Discussed POC and what to expect. Author plans to perform stairs with pt tomorrow morning in hopes she can progress to safe DC home with [facility] to follow.    If plan is discharge home, recommend the following: A little help with walking and/or transfers;A little help with bathing/dressing/bathroom;Assistance with cooking/housework;Assist for transportation;Help with stairs or ramp for entrance     Equipment Recommendations  None recommended by PT       Precautions / Restrictions Precautions Precautions: Fall;Knee Precaution Booklet Issued: Yes (comment) Recall of Precautions/Restrictions: Intact Restrictions Weight Bearing Restrictions Per Provider Order: Yes RLE Weight Bearing Per Provider Order: Weight bearing as tolerated     Mobility  Bed Mobility Overal bed mobility: Needs Assistance Bed Mobility: Supine to Sit, Sit to Supine  Supine to sit: Supervision Sit to supine: Supervision     Transfers Overall transfer level: Needs assistance Equipment used: Rolling walker (2 wheels) Transfers: Sit to/from  Stand Sit to Stand: Contact guard assist     Ambulation/Gait Ambulation/Gait assistance: Contact guard assist Gait Distance (Feet): 120 Feet Assistive device: Rolling walker (2 wheels) Gait Pattern/deviations: Step-to pattern, Step-through pattern, Antalgic Gait velocity: decreased  General Gait Details: Pt was able to ambulate ~ 120 ft with RW with slow gait kinematics and several standing rest breaks due to UE fatigue. No LOB.   Balance Overall balance assessment: Needs assistance Sitting-balance support: Feet supported Sitting balance-Leahy Scale: Good     Standing balance support: Bilateral upper extremity supported, During functional activity Standing balance-Leahy Scale: Fair       Hotel Manager: No apparent difficulties  Cognition Arousal: Alert Behavior During Therapy: WFL for tasks assessed/performed   PT - Cognitive impairments: No apparent impairments    Following commands: Intact      Cueing Cueing Techniques: Verbal cues         Pertinent Vitals/Pain Pain Assessment Pain Assessment: 0-10 Pain Score: 8  Pain Location: R knee Pain Descriptors / Indicators: Throbbing, Aching Pain Intervention(s): Limited activity within patient's tolerance, Monitored during session, Premedicated before session, Repositioned     PT Goals (current goals can now be found in the care plan section) Progress towards PT goals: Progressing toward goals    Frequency    BID       AM-PAC PT 6 Clicks Mobility   Outcome Measure  Help needed turning from your back to your side while in a flat bed without using bedrails?: A Little Help needed moving from lying on your back to sitting on the side of a flat bed without using bedrails?: A Little Help needed moving to and from  a bed to a chair (including a wheelchair)?: A Little Help needed standing up from a chair using your arms (e.g., wheelchair or bedside chair)?: A Little Help needed to walk in  hospital room?: A Little Help needed climbing 3-5 steps with a railing? : A Little 6 Click Score: 18    End of Session   Activity Tolerance: Patient tolerated treatment well;Patient limited by pain Patient left: in chair;with call bell/phone within reach;with chair alarm set Nurse Communication: Mobility status PT Visit Diagnosis: Other abnormalities of gait and mobility (R26.89);Difficulty in walking, not elsewhere classified (R26.2);Muscle weakness (generalized) (M62.81);Pain Pain - Right/Left: Right Pain - part of body: Knee     Time: 8577-8552 PT Time Calculation (min) (ACUTE ONLY): 25 min  Charges:    $Gait Training: 8-22 mins $Therapeutic Activity: 8-22 mins PT General Charges $$ ACUTE PT VISIT: 1 Visit                    Rankin Essex PTA 08/23/2024, 5:13 PM

## 2024-08-23 NOTE — Evaluation (Signed)
 Physical Therapy Evaluation Patient Details Name: Ellen Drake MRN: [id number] DOB: 1946-04-10 Today's Date: 08/23/2024  History of Present Illness  Pt is a 79 yo female s/p R TKA revision. PMH of smoking, Crohns disease, HTN, pacemaker, cervical surgery, rotator cuff repair.  Clinical Impression  Patient alert, agreeable to PT, reported 10/10 pain but willing to mobilize (RN notified but unable to have any more medication at this time). At baseline the pt is independent/modI for ambulation and ADLs. Sit <> stand with RW and minA, extra time, verbal cues. She was able to ambulate ~26ft with RW, CGA, and close chair follow. Step to gait pattern noted and pt complained of light headedness/dizziness after ambulation, noted for low BP and RN in room to assess. Pt left with needs in reach.  Overall the patient demonstrated deficits (see PT Problem List) that impede the patient's functional abilities, safety, and mobility and would benefit from skilled PT intervention.          If plan is discharge home, recommend the following: A little help with walking and/or transfers;A little help with bathing/dressing/bathroom;Assistance with cooking/housework;Assist for transportation;Help with stairs or ramp for entrance   Can travel by private vehicle        Equipment Recommendations None recommended by PT  Recommendations for Other Services       Functional Status Assessment Patient has had a recent decline in their functional status and demonstrates the ability to make significant improvements in function in a reasonable and predictable amount of time.     Precautions / Restrictions Precautions Precautions: Fall;Knee Precaution Booklet Issued: Yes (comment) Recall of Precautions/Restrictions: Intact Restrictions Weight Bearing Restrictions Per Provider Order: Yes RLE Weight Bearing Per Provider Order: Weight bearing as tolerated      Mobility  Bed Mobility                General bed mobility comments: NT up in chair at start/end of session    Transfers Overall transfer level: Needs assistance Equipment used: Rolling walker (2 wheels) Transfers: Sit to/from Stand Sit to Stand: Min assist                Ambulation/Gait Ambulation/Gait assistance: Contact guard assist Gait Distance (Feet): 50 Feet Assistive device: Rolling walker (2 wheels)   Gait velocity: decreased     General Gait Details: did complain of some lightheadedness/dizziness at end of ambulation. BP low, RN notified  Stairs            Wheelchair Mobility     Tilt Bed    Modified Rankin (Stroke Patients Only)       Balance Overall balance assessment: Needs assistance Sitting-balance support: Feet supported Sitting balance-Leahy Scale: Good     Standing balance support: Bilateral upper extremity supported, During functional activity Standing balance-Leahy Scale: Fair                               Pertinent Vitals/Pain Pain Assessment Pain Assessment: 0-10 Pain Score: 10-Worst pain ever Pain Location: R knee Pain Descriptors / Indicators: Throbbing, Aching Pain Intervention(s): Limited activity within patient's tolerance, Monitored during session, Repositioned, Ice applied, Patient requesting pain meds-RN notified, Premedicated before session    Home Living Family/patient expects to be discharged to:: Private residence Living Arrangements: Spouse/significant other Available Help at Discharge: Family;Available 24 hours/day Type of Home: Mobile home Home Access: Stairs to enter Entrance Stairs-Rails: Right;Left Entrance Stairs-Number of Steps: 5   Home  Layout: One level Home Equipment: Cane - single Librarian, Academic (2 wheels)      Prior Function Prior Level of Function : Independent/Modified Independent;Driving                     Extremity/Trunk Assessment   Upper Extremity Assessment Upper Extremity Assessment: Defer  to OT evaluation    Lower Extremity Assessment Lower Extremity Assessment:  (able to lift BLE against gravity)       Communication        Cognition Arousal: Alert Behavior During Therapy: WFL for tasks assessed/performed   PT - Cognitive impairments: No apparent impairments                                 Cueing       General Comments      Exercises     Assessment/Plan    PT Assessment Patient needs continued PT services  PT Problem List Decreased strength;Pain;Decreased activity tolerance;Decreased balance;Decreased mobility;Decreased knowledge of precautions;Decreased knowledge of use of DME;Decreased range of motion       PT Treatment Interventions DME instruction;Balance training;Gait training;Neuromuscular re-education;Stair training;Functional mobility training;Patient/family education;Therapeutic activities;Therapeutic exercise    PT Goals (Current goals can be found in the Care Plan section)  Acute Rehab PT Goals Patient Stated Goal: to have less pain PT Goal Formulation: With patient Time For Goal Achievement: 09/06/24 Potential to Achieve Goals: Good    Frequency BID     Co-evaluation               AM-PAC PT 6 Clicks Mobility  Outcome Measure Help needed turning from your back to your side while in a flat bed without using bedrails?: A Little Help needed moving from lying on your back to sitting on the side of a flat bed without using bedrails?: A Little Help needed moving to and from a bed to a chair (including a wheelchair)?: A Little Help needed standing up from a chair using your arms (e.g., wheelchair or bedside chair)?: A Little Help needed to walk in hospital room?: A Little Help needed climbing 3-5 steps with a railing? : A Little 6 Click Score: 18    End of Session Equipment Utilized During Treatment: Gait belt Activity Tolerance: Other (comment) (limited by BP) Patient left: in chair;with call bell/phone within  reach;with chair alarm set Nurse Communication: Mobility status PT Visit Diagnosis: Other abnormalities of gait and mobility (R26.89);Difficulty in walking, not elsewhere classified (R26.2);Muscle weakness (generalized) (M62.81);Pain Pain - Right/Left: Right Pain - part of body: Knee    Time: 8956-8896 PT Time Calculation (min) (ACUTE ONLY): 20 min   Charges:   PT Evaluation $PT Eval Low Complexity: 1 Low PT Treatments $Therapeutic Activity: 8-22 mins PT General Charges $$ ACUTE PT VISIT: 1 Visit        Doyal Shams PT, DPT 12:34 PM,08/23/2024

## 2024-08-23 NOTE — Evaluation (Signed)
 Occupational Therapy Evaluation Patient Details Name: Ellen Drake MRN: [id number] DOB: 1946/07/29 Today's Date: 08/23/2024   History of Present Illness   Pt is a 79 yo female s/p R TKA revision. PMH of smoking, Crohns disease, HTN, pacemaker, cervical surgery, rotator cuff repair.     Clinical Impressions Patient presenting with decreased Ind in self care,balance, functional mobility, transfers, endurance, and safety awareness.Patient reports being Mod I at baseline and living at home with husband. Pt endorses 8/10 pain at beginning of session. Bed mobility performed with supervision to EOB.  Min guard to stand and ambulates with very slow gait speed to bathroom with RW for toileting with supervision. Once seated pt requesting to sit for possible BM. OT provided educational handout for ADLs and polar care as well.  Patient will benefit from acute OT to increase overall independence in the areas of ADLs, functional mobility, and safety awareness in order to safely discharge.      If plan is discharge home, recommend the following:   A little help with walking and/or transfers;A little help with bathing/dressing/bathroom;Help with stairs or ramp for entrance;Assist for transportation     Functional Status Assessment   Patient has had a recent decline in their functional status and demonstrates the ability to make significant improvements in function in a reasonable and predictable amount of time.     Equipment Recommendations   None recommended by OT      Precautions/Restrictions   Precautions Precautions: Fall;Knee Restrictions Weight Bearing Restrictions Per Provider Order: Yes RLE Weight Bearing Per Provider Order: Weight bearing as tolerated     Mobility Bed Mobility Overal bed mobility: Needs Assistance Bed Mobility: Supine to Sit     Supine to sit: Supervision          Transfers Overall transfer level: Needs assistance Equipment used: Rolling walker  (2 wheels) Transfers: Sit to/from Stand Sit to Stand: Contact guard assist                  Balance Overall balance assessment: Needs assistance Sitting-balance support: Feet supported Sitting balance-Leahy Scale: Good     Standing balance support: Bilateral upper extremity supported, During functional activity Standing balance-Leahy Scale: Fair                             ADL either performed or assessed with clinical judgement   ADL Overall ADL's : Needs assistance/impaired     Grooming: Standing;Supervision/safety                   Toilet Transfer: Comfort height toilet;Ambulation;Contact guard assist   Toileting- Clothing Manipulation and Hygiene: Contact guard assist;Sit to/from stand       Functional mobility during ADLs: Supervision/safety       Vision Patient Visual Report: No change from baseline              Pertinent Vitals/Pain Pain Assessment Pain Assessment: 0-10 Pain Score: 8  Pain Location: R knee Pain Descriptors / Indicators: Throbbing, Aching Pain Intervention(s): Premedicated before session, Repositioned, Limited activity within patient's tolerance, Ice applied     Extremity/Trunk Assessment Upper Extremity Assessment Upper Extremity Assessment: Generalized weakness   Lower Extremity Assessment Lower Extremity Assessment: Defer to PT evaluation       Communication Communication Communication: No apparent difficulties   Cognition Arousal: Alert Behavior During Therapy: WFL for tasks assessed/performed Cognition: No apparent impairments  Following commands: Intact       Cueing  General Comments   Cueing Techniques: Verbal cues              Home Living Family/patient expects to be discharged to:: Private residence Living Arrangements: Spouse/significant other Available Help at Discharge: Family;Available 24 hours/day Type of Home: Mobile home Home  Access: Stairs to enter Entrance Stairs-Number of Steps: 5 Entrance Stairs-Rails: Right;Left Home Layout: One level     Bathroom Shower/Tub: Producer, Television/film/video: Handicapped height     Home Equipment: Cane - single Librarian, Academic (2 wheels)          Prior Functioning/Environment Prior Level of Function : Independent/Modified Independent;Driving                    OT Problem List: Decreased strength;Decreased activity tolerance;Impaired balance (sitting and/or standing);Decreased safety awareness   OT Treatment/Interventions: Self-care/ADL training;Therapeutic exercise;Patient/family education;Balance training;Energy conservation;Therapeutic activities;DME and/or AE instruction      OT Goals(Current goals can be found in the care plan section)   Acute Rehab OT Goals Patient Stated Goal: to decrease pain OT Goal Formulation: With patient Time For Goal Achievement: 09/06/24 Potential to Achieve Goals: Fair ADL Goals Pt Will Perform Lower Body Dressing: with modified independence;sit to/from stand Pt Will Transfer to Toilet: with modified independence;ambulating Pt Will Perform Toileting - Clothing Manipulation and hygiene: with modified independence;sit to/from stand   OT Frequency:  Min 2X/week       AM-PAC OT 6 Clicks Daily Activity     Outcome Measure Help from another person eating meals?: None Help from another person taking care of personal grooming?: None Help from another person toileting, which includes using toliet, bedpan, or urinal?: A Little Help from another person bathing (including washing, rinsing, drying)?: A Little Help from another person to put on and taking off regular upper body clothing?: None Help from another person to put on and taking off regular lower body clothing?: A Little 6 Click Score: 21   End of Session Equipment Utilized During Treatment: Rolling walker (2 wheels) Nurse Communication: Mobility  status  Activity Tolerance: Patient tolerated treatment well Patient left: Other (comment) (seated on commode for BM)  OT Visit Diagnosis: Muscle weakness (generalized) (M62.81);Unsteadiness on feet (R26.81)                Time: 9061-9042 OT Time Calculation (min): 19 min Charges:  OT General Charges $OT Visit: 1 Visit OT Evaluation $OT Eval Moderate Complexity: 1 Mod OT Treatments $Self Care/Home Management : 8-22 mins  Izetta Claude, MS, OTR/L , CBIS ascom [phone]  08/23/2024, 12:55 PM

## 2024-08-23 NOTE — Progress Notes (Signed)
 Subjective: 1 Day Post-Op Procedures (LRB): TOTAL KNEE REVISION (Right) Patient reports pain as moderate.   Patient is well, and has had no acute complaints or problems Denies any CP, SOB, N/V, fevers or chills We will start therapy today.  Plan is to go Home after hospital stay.  Objective: Vital signs in last 24 hours: Temp:  [97.1 F (36.2 C)-97.9 F (36.6 C)] 97.8 F (36.6 C) (01/13 0808) Pulse Rate:  [64-93] 87 (01/13 0808) Resp:  [8-27] 17 (01/13 0808) BP: (56-148)/(39-69) 107/65 (01/13 0808) SpO2:  [90 %-100 %] 91 % (01/13 0808) Weight:  [66.4 kg] 66.4 kg (01/12 2243)  Intake/Output from previous day:  Intake/Output Summary (Last 24 hours) at 08/23/2024 0821 Last data filed at 08/23/2024 0500 Gross per 24 hour  Intake 2354.33 ml  Output 710 ml  Net 1644.33 ml    Intake/Output this shift: No intake/output data recorded.  Labs: No results for input(s): HGB in the last 72 hours. No results for input(s): WBC, RBC, HCT, PLT in the last 72 hours. No results for input(s): NA, K, CL, CO2, BUN, CREATININE, GLUCOSE, CALCIUM  in the last 72 hours. No results for input(s): LABPT, INR in the last 72 hours.  EXAM General - Patient is alert, appropriate, and oriented. Extremity - Neurologically intact ABD soft Neurovascular intact Sensation intact distally Intact pulses distally Dorsiflexion/Plantar flexion intact Dressing -  dressing C/D/I. Polar care unit in place. JP Drain pulled without difficulty. Intact Motor Function - intact, moving foot and toes well on exam. Unable to straight leg raise independently.   Past Medical History:  Diagnosis Date   Anemia    reslved   Colon polyp 2009   Crohn's disease [facility])    Diverticulosis    Fibrocystic breast disease    Grade I diastolic dysfunction    Herniated nucleus pulposus of lumbosacral region    History of chicken pox    Hypercholesterolemia    Hyperlipidemia    Hypertension    Mild  aortic stenosis by prior echocardiogram    Mild mitral regurgitation by prior echocardiogram    Osteoarthritis    Pacemaker    Pulmonary nodule seen on imaging study 2004   resolved 2007   Second degree AV block, Mobitz type II     Assessment/Plan: 1 Day Post-Op Procedures (LRB): TOTAL KNEE REVISION (Right) Principal Problem:   History of revision of total knee arthroplasty  Estimated body mass index is 27.66 kg/m as calculated from the following:   Height as of this encounter: 5' 1 (1.549 m).   Weight as of this encounter: 66.4 kg. Advance diet Up with therapy D/C IV fluids Discharge home with home health pending progress with PT  Patient will continue to work with physical therapy to pass postoperative PT protocols, ROM and strengthening  Discussed with the patient continuing to utilize Polar Care  Patient will use bone foam in 20-30 minute intervals  Patient will wear TED hose bilaterally to help prevent DVT and clot formation  Discussed the Aquacel bandage.  This bandage will stay in place 7 days postoperatively.  Can be replaced with honeycomb bandages that will be sent home with the patient  Discussed sending the patient home with tramadol  and oxycodone  for as needed pain management.  Patient will also be sent home with Celebrex  to help with swelling and inflammation.  Patient will take an 81 mg aspirin  twice daily for DVT prophylaxis  JP drain removed without difficulty, intact  Weight-Bearing as tolerated to right leg  Cleared for discharge. Patient will follow-up with [facility] clinic orthopedics in 2 weeks for staple removal and reevaluation  Vick Dunk, PA-C [facility] 08/23/2024, 8:21 AM

## 2024-08-23 NOTE — Plan of Care (Signed)

## 2024-08-23 NOTE — TOC Initial Note (Signed)
 Transition of Care [facility]) - Initial/Assessment Note    Patient Details  Name: Ellen Drake MRN: [id number] Date of Birth: 1946/02/05  Transition of Care [facility]) CM/SW Contact:    Lauraine JAYSON Carpen, LCSW Phone Number: 08/23/2024, 3:38 PM  Clinical Narrative:   CSW met with patient. No family at bedside. CSW introduced role and explained that discharge planning would be discussed. Patient was set up with [facility] prior to surgery and she is agreeable to this plan. No DME recommendations. No further concerns. CSW will continue to follow patient for support and facilitate return home once stable. Husband will transport her home at discharge.               Expected Discharge Plan: Home w Home Health Services Barriers to Discharge: Continued Medical Work up   Patient Goals and CMS Choice            Expected Discharge Plan and Services     Post Acute Care Choice: Home Health Living arrangements for the past 2 months: Single Family Home                           HH Arranged: PT HH Agency: [facility]        Prior Living Arrangements/Services Living arrangements for the past 2 months: Single Family Home Lives with:: Spouse Patient language and need for interpreter reviewed:: Yes Do you feel safe going back to the place where you live?: Yes      Need for Family Participation in Patient Care: Yes (Comment) Care giver support system in place?: Yes (comment) Current home services: DME Criminal Activity/Legal Involvement Pertinent to Current Situation/Hospitalization: No - Comment as needed  Activities of Daily Living   ADL Screening (condition at time of admission) Independently performs ADLs?: Yes (appropriate for developmental age) Is the patient deaf or have difficulty hearing?: No Does the patient have difficulty seeing, even when wearing glasses/contacts?: No Does the patient have difficulty concentrating, remembering, or making decisions?:  No  Permission Sought/Granted Permission sought to share information with : Facility Industrial/product Designer granted to share information with : Yes, Verbal Permission Granted     Permission granted to share info w AGENCY: [facility]        Emotional Assessment Appearance:: Appears stated age Attitude/Demeanor/Rapport: Engaged, Gracious Affect (typically observed): Accepting, Appropriate, Calm, Pleasant Orientation: : Oriented to Self, Oriented to Place, Oriented to  Time, Oriented to Situation Alcohol  / Substance Use: Not Applicable Psych Involvement: No (comment)  Admission diagnosis:  Effusion of right knee [M25.461] Chronic knee pain after total replacement of right knee joint [M25.561, G89.29, Z96.651] History of revision of total knee arthroplasty [Z96.659] Patient Active Problem List   Diagnosis Date Noted   History of revision of total knee arthroplasty 08/22/2024   Presence of heart assist device (HCC) 06/26/2024   Rhinorrhea 03/10/2024   COVID-19 03/03/2024   Cardiac pacemaker in situ 01/28/2024   Abnormal EKG 12/09/2023   Environmental allergies 07/18/2023   Foot cramps 07/13/2023   Heart block AV second degree 03/17/2023   Bradycardia 03/16/2023   Second degree AV block, Mobitz type II 03/16/2023   Anemia 01/08/2023   Light headedness 10/15/2022   Leg cramps 10/15/2022   Falls, sequela 09/08/2022   Head trauma 08/26/2022   Left shoulder pain 12/24/2021   Cough 06/16/2021   Chronic use of opiate for therapeutic purpose 02/19/2021   Neurogenic pain 02/05/2021  Vitamin D  deficiency 01/17/2021   Pain and swelling of knee (Right) 01/14/2021   Fibrocystic breast disease in female 01/13/2021   Chronic pain syndrome 01/13/2021   Pharmacologic therapy 01/13/2021   Disorder of skeletal system 01/13/2021   Problems influencing health status 01/13/2021   Chronic pain disorder 01/13/2021   Shoulder pain, bilateral 03/16/2020   Right leg  swelling 05/18/2019   Abdominal pain 05/18/2019   Vaginal burning 05/15/2019   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   Primary osteoarthritis of right knee 03/23/2017   Vitamin B12 deficiency 12/17/2015   Health care maintenance 10/02/2014   Chronic knee pain (1ry area of Pain) (Right) 06/15/2014   Disorder of the skin and subcutaneous tissue, unspecified 06/15/2014   History of unicompartmental knee replacement (Right) 06/15/2014    Class: History of   Chronic knee pain after total replacement of right knee joint 06/15/2014   Synovitis of knee 03/16/2014   Ear lesion 05/08/2013   Arthritis 05/08/2013   H/O adenomatous polyp of colon 11/29/2012   Hypertension 08/12/2012   Hypercholesterolemia 08/12/2012   Crohn's disease (HCC) 08/12/2012   Therapeutic drug monitoring 01/26/2003   Crohn's colitis (HCC) 01/25/2002   PCP:  Glendia Shad, MD Pharmacy:   [id number] [place], [place] - [address] [place] [address] Phone: [phone] Fax: [phone]  [facility] - [facility] [address] [place] [address] Phone: [phone] Fax: [phone]  [id number], [place] - [address] [place] [address] Phone: [phone] Fax: [phone]     Social Drivers of Health (SDOH) Social History: SDOH Screenings   Food Insecurity: No Food Insecurity (08/22/2024)  Housing: Low Risk (08/22/2024)  Transportation Needs: No Transportation Needs (08/22/2024)  Utilities: Not At Risk (08/22/2024)  Alcohol  Screen: Low Risk (07/26/2024)  Depression (PHQ2-9): Low Risk (07/27/2024)  Financial Resource Strain: Low Risk  (08/16/2024)   Received from [facility]  Physical Activity: Insufficiently Active (07/26/2024)  Social Connections: Moderately Integrated (08/22/2024)  Stress: No Stress Concern Present (07/26/2024)  Tobacco Use: Medium Risk (08/22/2024)  Health Literacy: Adequate Health  Literacy (07/26/2024)   SDOH Interventions:     Readmission Risk Interventions     No data to display

## 2024-08-24 ENCOUNTER — Other Ambulatory Visit: Payer: Self-pay

## 2024-08-24 MED ORDER — ACETAMINOPHEN 325 MG PO TABS: 325.0000 mg | ORAL_TABLET | Freq: Four times a day (QID) | ORAL | Status: AC | PRN

## 2024-08-24 MED ORDER — TRAMADOL HCL 50 MG PO TABS
50.0000 mg | ORAL_TABLET | Freq: Four times a day (QID) | ORAL | 0 refills | Status: AC | PRN
  Filled 2024-08-24: qty 30, 4d supply, fill #0

## 2024-08-24 MED ORDER — CELECOXIB 200 MG PO CAPS
200.0000 mg | ORAL_CAPSULE | Freq: Two times a day (BID) | ORAL | 0 refills | Status: AC
  Filled 2024-08-24: qty 90, 45d supply, fill #0

## 2024-08-24 MED ORDER — ASPIRIN 81 MG PO CHEW: 81.0000 mg | CHEWABLE_TABLET | Freq: Two times a day (BID) | ORAL | Status: AC

## 2024-08-24 MED ORDER — OXYCODONE HCL 5 MG PO TABS
5.0000 mg | ORAL_TABLET | ORAL | 0 refills | Status: AC | PRN
  Filled 2024-08-24: qty 30, 5d supply, fill #0

## 2024-08-24 NOTE — Discharge Summary (Signed)
 " Physician Discharge Summary  Patient ID: Ellen Drake MRN: [id number] DOB/AGE: 79-Jun-1947 79 y.o.  Admit date: 08/22/2024 Discharge date: 08/24/2024  Admission Diagnoses:  Effusion of right knee [M25.461] Chronic knee pain after total replacement of right knee joint [M25.561, G89.29, Z96.651] History of revision of total knee arthroplasty [Z96.659]   Discharge Diagnoses: Patient Active Problem List   Diagnosis Date Noted   History of revision of total knee arthroplasty 08/22/2024   Presence of heart assist device (HCC) 06/26/2024   Rhinorrhea 03/10/2024   COVID-19 03/03/2024   Cardiac pacemaker in situ 01/28/2024   Abnormal EKG 12/09/2023   Environmental allergies 07/18/2023   Foot cramps 07/13/2023   Heart block AV second degree 03/17/2023   Bradycardia 03/16/2023   Second degree AV block, Mobitz type II 03/16/2023   Anemia 01/08/2023   Light headedness 10/15/2022   Leg cramps 10/15/2022   Falls, sequela 09/08/2022   Head trauma 08/26/2022   Left shoulder pain 12/24/2021   Cough 06/16/2021   Chronic use of opiate for therapeutic purpose 02/19/2021   Neurogenic pain 02/05/2021   Vitamin D  deficiency 01/17/2021   Pain and swelling of knee (Right) 01/14/2021   Fibrocystic breast disease in female 01/13/2021   Chronic pain syndrome 01/13/2021   Pharmacologic therapy 01/13/2021   Disorder of skeletal system 01/13/2021   Problems influencing health status 01/13/2021   Chronic pain disorder 01/13/2021   Shoulder pain, bilateral 03/16/2020   Right leg swelling 05/18/2019   Abdominal pain 05/18/2019   Vaginal burning 05/15/2019   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   Primary osteoarthritis of right knee 03/23/2017   Vitamin B12 deficiency 12/17/2015   Health care maintenance 10/02/2014   Chronic knee pain (1ry area of Pain) (Right) 06/15/2014   Disorder of the skin and subcutaneous tissue, unspecified 06/15/2014   History of unicompartmental knee  replacement (Right) 06/15/2014    Class: History of   Chronic knee pain after total replacement of right knee joint 06/15/2014   Synovitis of knee 03/16/2014   Ear lesion 05/08/2013   Arthritis 05/08/2013   H/O adenomatous polyp of colon 11/29/2012   Hypertension 08/12/2012   Hypercholesterolemia 08/12/2012   Crohn's disease (HCC) 08/12/2012   Therapeutic drug monitoring 01/26/2003   Crohn's colitis (HCC) 01/25/2002    Past Medical History:  Diagnosis Date   Anemia    reslved   Colon polyp 2009   Crohn's disease (HCC)    Diverticulosis    Fibrocystic breast disease    Grade I diastolic dysfunction    Herniated nucleus pulposus of lumbosacral region    History of chicken pox    Hypercholesterolemia    Hyperlipidemia    Hypertension    Mild aortic stenosis by prior echocardiogram    Mild mitral regurgitation by prior echocardiogram    Osteoarthritis    Pacemaker    Pulmonary nodule seen on imaging study 2004   resolved 2007   Second degree AV block, Mobitz type II       Consultants (if any):   Discharged Condition: Improved  Hospital Course: Tanetta Day Macrae is an 79 y.o. female who was admitted 08/22/2024 with a diagnosis of History of revision of total knee arthroplasty and went to the operating room on 08/22/2024 and underwent the below named procedures.    Surgeries: Procedures: TOTAL KNEE REVISION on 08/22/2024 Patient tolerated the surgery well. Taken to PACU where she was stabilized and then transferred to the orthopedic floor.  Started on ASA 81mg  BID.  Heels elevated on bed with rolled towels. No evidence of DVT. Negative Homan. Physical therapy started on day #1 for gait training and transfer. OT started day #1 for ADL and assisted devices.  Patient's IV ,and hemovac was d/c on day #1. Foley was removed shortly after surgery.   Implants: DePuy Attune size 6 revision CRS femoral component (cemented), 30 mm fully coated revision femoral sleeve, 12 mm x 60 mm  press-fit stem, 4 mm distal femoral augments medial and lateral, 4 mm posterior lateral augment, 8 mm posterior medial augment, size 6 revision tibial base rotating platform, 29 mm fully coated revision tibial sleeve, 10 mm x 110 mm press-fit stem, and a 6 mm revision CRS rotating platform polyethylene insert   She was given perioperative antibiotics:  Anti-infectives (From admission, onward)    Start     Dose/Rate Route Frequency Ordered Stop   08/22/24 2315  ceFAZolin  (ANCEF ) IVPB 2g/100 mL premix        2 g 200 mL/hr over 30 Minutes Intravenous Every 6 hours 08/22/24 2315 08/23/24 1129   08/22/24 0930  ceFAZolin  (ANCEF ) IVPB 2g/100 mL premix        2 g 200 mL/hr over 30 Minutes Intravenous On call to O.R. 08/22/24 9072 08/22/24 2030     .  She was given sequential compression devices, early ambulation, and ASA for DVT prophylaxis.  She benefited maximally from the hospital stay and there were no complications.    Recent vital signs:  Vitals:   08/24/24 0348 08/24/24 0703  BP: (!) 136/54 (!) 124/45  Pulse: 93 99  Resp: 14 15  Temp: 98.4 F (36.9 C) 98.2 F (36.8 C)  SpO2: 100% 94%    Recent laboratory studies:  Lab Results  Component Value Date   HGB 12.0 08/16/2024   HGB 12.3 06/14/2024   HGB 12.2 10/15/2023   Lab Results  Component Value Date   WBC 7.1 08/16/2024   PLT 204 08/16/2024   No results found for: INR Lab Results  Component Value Date   NA 143 08/16/2024   K 3.5 08/16/2024   CL 104 08/16/2024   CO2 29 08/16/2024   BUN 17 08/16/2024   CREATININE 0.85 08/16/2024   GLUCOSE 84 08/16/2024    Discharge Medications:   Allergies as of 08/24/2024       Reactions   Mercaptopurine Nausea Only, Other (See Comments)   Lyrica [pregabalin]    Hand swelling   Dilaudid  [hydromorphone  Hcl] Rash        Medication List     STOP taking these medications    aspirin  EC 81 MG tablet Replaced by: aspirin  81 MG chewable tablet       TAKE these  medications    acetaminophen  325 MG tablet Commonly known as: TYLENOL  Take 1-2 tablets (325-650 mg total) by mouth every 6 (six) hours as needed for mild pain (pain score 1-3) (or temp > 100.5).   amLODipine  10 MG tablet Commonly known as: NORVASC  Take 1 tablet (10 mg total) by mouth daily.   aspirin  81 MG chewable tablet Chew 1 tablet (81 mg total) by mouth 2 (two) times daily. Replaces: aspirin  EC 81 MG tablet   carboxymethylcellulose 0.5 % Soln Commonly known as: REFRESH PLUS Place 1 drop into both eyes 3 (three) times daily as needed (dry/irritated eyes).   carvedilol  12.5 MG tablet Commonly known as: COREG  TAKE 1 TABLET BY MOUTH 2 TIMES DAILY.   celecoxib  200 MG capsule Commonly known as: CELEBREX   Take 1 capsule (200 mg total) by mouth 2 (two) times daily.   clobetasol ointment 0.05 % Commonly known as: TEMOVATE Apply 1 Application topically 2 (two) times daily as needed (skin irritation.).   cyanocobalamin  1000 MCG tablet Take 1,000 mcg by mouth daily.   folic acid  1 MG tablet Commonly known as: FOLVITE  Take 1 mg by mouth in the morning.   hydrochlorothiazide  12.5 MG capsule Commonly known as: MICROZIDE  Take 1 capsule (12.5 mg total) by mouth daily.   latanoprost  0.005 % ophthalmic solution Commonly known as: XALATAN  Place 1 drop into both eyes at bedtime.   oxyCODONE  5 MG immediate release tablet Commonly known as: Oxy IR/ROXICODONE  Take 1 tablet (5 mg total) by mouth every 4 (four) hours as needed for moderate pain (pain score 4-6) (pain score 4-6).   potassium chloride  10 MEQ tablet Commonly known as: KLOR-CON  M TAKE 2 TABLETS BY MOUTH TWICE A DAY   sulfaSALAzine  500 MG tablet Commonly known as: AZULFIDINE  Take 3 tablets (1,500 mg total) by mouth 2 (two) times daily. 3 tabs   timolol  0.5 % ophthalmic solution Commonly known as: TIMOPTIC  Place 1 drop into both eyes in the morning.   traMADol  50 MG tablet Commonly known as: ULTRAM  Take 1-2 tablets  (50-100 mg total) by mouth every 6 (six) hours as needed for moderate pain (pain score 4-6). What changed:  how much to take when to take this reasons to take this additional instructions   triamcinolone  cream 0.1 % Commonly known as: KENALOG  Apply 1 Application topically as needed (prn). As needed   Voltaren Arthritis Pain 1 % Gel Generic drug: diclofenac Sodium Apply 1 Application topically 4 (four) times daily as needed (knee pain.).               Durable Medical Equipment  (From admission, onward)           Start     Ordered   08/22/24 2313  DME Walker rolling  Once       Question:  Patient needs a walker to treat with the following condition  Answer:  Total knee replacement status   08/22/24 2312   08/22/24 2313  DME Bedside commode  Once       Comments: Patient is not able to walk the distance required to go the bathroom, or he/she is unable to safely negotiate stairs required to access the bathroom.  A 3in1 BSC will alleviate this problem  Question:  Patient needs a bedside commode to treat with the following condition  Answer:  Total knee replacement status   08/22/24 2312            Diagnostic Studies: DG Knee Right Port Result Date: 08/22/2024 CLINICAL DATA:  Right knee arthroplasty revision EXAM: PORTABLE RIGHT KNEE - 1-2 VIEW COMPARISON:  08/22/2024 FINDINGS: Frontal and cross-table lateral views of the right knee are obtained. Long stem femoral and tibial components of a right hip arthroplasty are now identified. Alignment is anatomic. Postsurgical changes within the anterior soft tissues, with surgical drain in place. IMPRESSION: 1. Unremarkable right knee arthroplasty revision. Electronically Signed   By: Ozell Daring M.D.   On: 08/22/2024 22:27   DG Knee Right Port Result Date: 08/22/2024 CLINICAL DATA:  Septic joint, intraoperative exam to exclude retained foreign body EXAM: RIGHT KNEE - 3 VIEW COMPARISON:  08/26/2022 FINDINGS: Frontal and  cross-table lateral views of the right knee are obtained. Revision of right knee arthroplasty with long stem femoral and tibial components  identified. Surgical drain within the anterior right knee. No unexpected radiopaque foreign bodies. Postsurgical changes in the soft tissues. IMPRESSION: 1. Revision of right knee arthroplasty as above. No unexpected radiopaque foreign bodies. Findings were discussed with the operating room at the completion of the exam. Electronically Signed   By: Ozell Daring M.D.   On: 08/22/2024 21:23    Disposition: Discharge disposition: 01-Home or Self Care          Follow-up Information     Castanheiro de Charlynn Dunk, Vick V, [place] Follow up on 09/06/2024.   Specialty: Orthopedic Surgery Why: at 10:30am Contact information: [address] [place] [address] [phone]         Mardee Lynwood SQUIBB, MD Follow up on 10/04/2024.   Specialty: Orthopedic Surgery Why: at 1:45pm Contact information: [address] [facility] [place] [address] [phone]                 Cleared for discharge  Signed: Vick Dunk, PA-C 08/24/2024, 11:53 AM  "

## 2024-08-24 NOTE — Progress Notes (Signed)
 Physical Therapy Treatment Patient Details Name: Ellen Drake MRN: [id number] DOB: [birth year] Today's Date: 08/24/2024   History of Present Illness Pt is a 79 yo female s/p R TKA revision. PMH of smoking, Crohns disease, HTN, pacemaker, cervical surgery, rotator cuff repair.    PT Comments  Pt was long sitting in bed, dressed, and agreeable to session. She endorses 8/10 pain at rest but was pre-medicated just prior to PT session. Pt demonstrated improved abilities to exit bed, stand, and tolerate ambulation with use of RW. She tolerated ROM exercises well however has less AROM today versus previous date that dino feels is due to increased pain. AAROM ~4-82 degrees after multiple stretches.  No c/o dizziness or lightheadedness throughout session. Pt also demonstrates safe abilities to ascend/descend stairs to simulate home entry. Pt performed ascending 4 stair with R rail only with step to (sideways technique). CGA to perform stairs however pt did not need physical assistance. CGA only for additional safety. Encouraged pt to have spouse CGA when pt is Dc'd home. Overall pt tolerated session well. Recommend HHPT to maximize her ROM, strength, and safety functional mobility. Pt is cleared form ana cute PT standpoint for safe DC home once cleared medically.    If plan is discharge home, recommend the following: A little help with walking and/or transfers;A little help with bathing/dressing/bathroom;Assistance with cooking/housework;Assist for transportation;Help with stairs or ramp for entrance     Equipment Recommendations  None recommended by PT       Precautions / Restrictions Precautions Precautions: Fall;Knee Precaution Booklet Issued: Yes (comment) Recall of Precautions/Restrictions: Intact Restrictions Weight Bearing Restrictions Per Provider Order: Yes RLE Weight Bearing Per Provider Order: Weight bearing as tolerated     Mobility  Bed Mobility Overal bed mobility: Needs  Assistance Bed Mobility: Supine to Sit, Sit to Supine  Supine to sit: Supervision Sit to supine: Supervision   Transfers Overall transfer level: Needs assistance Equipment used: Rolling walker (2 wheels) Transfers: Sit to/from Stand Sit to Stand: Contact guard assist, Supervision  General transfer comment: lots of VCs for improved technique however pt is able to stand and sit with supervision only by the end of the session    Ambulation/Gait Ambulation/Gait assistance: Supervision Gait Distance (Feet): 200 Feet Assistive device: Rolling walker (2 wheels) Gait Pattern/deviations: Step-to pattern, Step-through pattern, Antalgic Gait velocity: decreased  General Gait Details: Pt was able to ambulate ~ 120 ft with RW with slow gait kinematics and several standing rest breaks due to UE fatigue.Vcs for improved gait sequencing however pt unable to tolerate step through pattern. antalgic step to gait observed.  No LOB.   Stairs Stairs: Yes Stairs assistance: Contact guard assist Stair Management: One rail Right, Step to pattern, Sideways Number of Stairs: 4 General stair comments: Pt demonstrated safe abilities to perform ascending/descending stairs to simulate home entry    Balance Overall balance assessment: Needs assistance Sitting-balance support: Feet supported Sitting balance-Leahy Scale: Good     Standing balance support: Bilateral upper extremity supported, During functional activity Standing balance-Leahy Scale: Good Standing balance comment: no LOB however reliant on RW for all dynamic standing activity        Communication Communication Communication: No apparent difficulties  Cognition Arousal: Alert Behavior During Therapy: WFL for tasks assessed/performed   PT - Cognitive impairments: No apparent impairments       PT - Cognition Comments: Pt is A and O x 4. Lengthy education on positioning, need for polar care use, importance of ROM, car transfers  and  expectations for HHPT Following commands: Intact      Cueing Cueing Techniques: Verbal cues  Exercises Total Joint Exercises Goniometric ROM: 4-82 AAROM    General Comments General comments (skin integrity, edema, etc.): Pt performed and tolerated ROM while seated EOB. Less flexion observed today that author contributes to pt's increased pain.      Pertinent Vitals/Pain Pain Assessment Pain Assessment: 0-10 Pain Score: 8  Pain Location: R knee Pain Descriptors / Indicators: Throbbing, Aching Pain Intervention(s): RN gave pain meds during session, Repositioned, Ice applied, Limited activity within patient's tolerance, Monitored during session     PT Goals (current goals can now be found in the care plan section) Acute Rehab PT Goals Patient Stated Goal: go home Progress towards PT goals: Progressing toward goals    Frequency    BID       AM-PAC PT 6 Clicks Mobility   Outcome Measure  Help needed turning from your back to your side while in a flat bed without using bedrails?: A Little Help needed moving from lying on your back to sitting on the side of a flat bed without using bedrails?: A Little Help needed moving to and from a bed to a chair (including a wheelchair)?: A Little Help needed standing up from a chair using your arms (e.g., wheelchair or bedside chair)?: A Little Help needed to walk in hospital room?: A Little Help needed climbing 3-5 steps with a railing? : A Little 6 Click Score: 18    End of Session   Activity Tolerance: Patient tolerated treatment well;Patient limited by pain Patient left: in chair;with call bell/phone within reach;with chair alarm set Nurse Communication: Mobility status PT Visit Diagnosis: Other abnormalities of gait and mobility (R26.89);Difficulty in walking, not elsewhere classified (R26.2);Muscle weakness (generalized) (M62.81);Pain Pain - Right/Left: Right Pain - part of body: Knee     Time: 0717-0755 PT Time  Calculation (min) (ACUTE ONLY): 38 min  Charges:    $Gait Training: 23-37 mins $Therapeutic Activity: 8-22 mins PT General Charges $$ ACUTE PT VISIT: 1 Visit                     Rankin Essex PTA 08/24/2024, 8:37 AM

## 2024-08-24 NOTE — Plan of Care (Signed)
   Problem: Education: Goal: Knowledge of General Education information will improve Description Including pain rating scale, medication(s)/side effects and non-pharmacologic comfort measures Outcome: Progressing

## 2024-08-24 NOTE — Progress Notes (Signed)
 DISCHARGE NOTE:  Pt and husband given discharge instructions, and verbalized understanding. TED hose on both legs, 2 honeycomb dressings, and meds to beds medications sent with pt. Pt wheeled to car by staff, husband providing transportation home.

## 2024-08-24 NOTE — Plan of Care (Signed)

## 2024-08-31 ENCOUNTER — Encounter: Payer: Self-pay | Admitting: Orthopedic Surgery

## 2024-09-12 ENCOUNTER — Other Ambulatory Visit: Payer: Self-pay | Admitting: Internal Medicine

## 2024-09-16 ENCOUNTER — Ambulatory Visit: Payer: Self-pay

## 2024-09-16 ENCOUNTER — Other Ambulatory Visit: Payer: Self-pay | Admitting: Internal Medicine

## 2024-10-18 ENCOUNTER — Other Ambulatory Visit

## 2024-10-20 ENCOUNTER — Ambulatory Visit: Admitting: Internal Medicine

## 2024-10-24 ENCOUNTER — Encounter: Admitting: Nurse Practitioner

## 2024-12-16 ENCOUNTER — Ambulatory Visit: Payer: Self-pay

## 2025-03-17 ENCOUNTER — Ambulatory Visit: Payer: Self-pay

## 2025-06-16 ENCOUNTER — Ambulatory Visit: Payer: Self-pay

## 2025-08-07 ENCOUNTER — Ambulatory Visit
